# Patient Record
Sex: Male | Born: 1965 | Race: White | Hispanic: No | Marital: Married | Smoking: Never smoker
Health system: Southern US, Community
[De-identification: ages and names within clinical notes are randomized; demographics above are authoritative.]

## PROBLEM LIST (undated history)

## (undated) DIAGNOSIS — I10 Essential (primary) hypertension: Secondary | ICD-10-CM

## (undated) DIAGNOSIS — I2510 Atherosclerotic heart disease of native coronary artery without angina pectoris: Secondary | ICD-10-CM

## (undated) DIAGNOSIS — F329 Major depressive disorder, single episode, unspecified: Secondary | ICD-10-CM

## (undated) DIAGNOSIS — I5020 Unspecified systolic (congestive) heart failure: Secondary | ICD-10-CM

## (undated) DIAGNOSIS — I350 Nonrheumatic aortic (valve) stenosis: Secondary | ICD-10-CM

## (undated) DIAGNOSIS — M545 Low back pain, unspecified: Secondary | ICD-10-CM

## (undated) DIAGNOSIS — Z9989 Dependence on other enabling machines and devices: Secondary | ICD-10-CM

## (undated) DIAGNOSIS — F32A Depression, unspecified: Secondary | ICD-10-CM

## (undated) DIAGNOSIS — J45909 Unspecified asthma, uncomplicated: Secondary | ICD-10-CM

## (undated) DIAGNOSIS — Z953 Presence of xenogenic heart valve: Secondary | ICD-10-CM

## (undated) DIAGNOSIS — R519 Headache, unspecified: Secondary | ICD-10-CM

## (undated) DIAGNOSIS — R011 Cardiac murmur, unspecified: Secondary | ICD-10-CM

## (undated) DIAGNOSIS — G4733 Obstructive sleep apnea (adult) (pediatric): Secondary | ICD-10-CM

## (undated) DIAGNOSIS — I509 Heart failure, unspecified: Secondary | ICD-10-CM

## (undated) DIAGNOSIS — M51369 Other intervertebral disc degeneration, lumbar region without mention of lumbar back pain or lower extremity pain: Secondary | ICD-10-CM

## (undated) DIAGNOSIS — R197 Diarrhea, unspecified: Secondary | ICD-10-CM

## (undated) DIAGNOSIS — I428 Other cardiomyopathies: Secondary | ICD-10-CM

## (undated) DIAGNOSIS — M5136 Other intervertebral disc degeneration, lumbar region: Secondary | ICD-10-CM

## (undated) DIAGNOSIS — E785 Hyperlipidemia, unspecified: Secondary | ICD-10-CM

## (undated) DIAGNOSIS — M7989 Other specified soft tissue disorders: Secondary | ICD-10-CM

## (undated) DIAGNOSIS — E119 Type 2 diabetes mellitus without complications: Secondary | ICD-10-CM

## (undated) DIAGNOSIS — I272 Pulmonary hypertension, unspecified: Secondary | ICD-10-CM

## (undated) HISTORY — DX: Heart failure, unspecified: I50.9

## (undated) HISTORY — PX: CARDIAC CATHETERIZATION: SHX172

## (undated) HISTORY — DX: Unspecified systolic (congestive) heart failure: I50.20

## (undated) HISTORY — DX: Dependence on other enabling machines and devices: Z99.89

## (undated) HISTORY — DX: Low back pain: M54.5

## (undated) HISTORY — DX: Other cardiomyopathies: I42.8

## (undated) HISTORY — DX: Nonrheumatic aortic (valve) stenosis: I35.0

## (undated) HISTORY — DX: Essential (primary) hypertension: I10

## (undated) HISTORY — DX: Morbid (severe) obesity due to excess calories: E66.01

## (undated) HISTORY — DX: Other intervertebral disc degeneration, lumbar region: M51.36

## (undated) HISTORY — DX: Atherosclerotic heart disease of native coronary artery without angina pectoris: I25.10

## (undated) HISTORY — DX: Type 2 diabetes mellitus without complications: E11.9

## (undated) HISTORY — DX: Unspecified asthma, uncomplicated: J45.909

## (undated) HISTORY — DX: Diarrhea, unspecified: R19.7

## (undated) HISTORY — DX: Presence of xenogenic heart valve: Z95.3

## (undated) HISTORY — DX: Major depressive disorder, single episode, unspecified: F32.9

## (undated) HISTORY — DX: Other specified soft tissue disorders: M79.89

## (undated) HISTORY — DX: Obstructive sleep apnea (adult) (pediatric): G47.33

## (undated) HISTORY — DX: Depression, unspecified: F32.A

## (undated) HISTORY — DX: Low back pain, unspecified: M54.50

## (undated) HISTORY — DX: Other intervertebral disc degeneration, lumbar region without mention of lumbar back pain or lower extremity pain: M51.369

## (undated) HISTORY — DX: Hyperlipidemia, unspecified: E78.5

---

## 2004-03-19 ENCOUNTER — Encounter: Admission: RE | Admit: 2004-03-19 | Discharge: 2004-03-19 | Payer: Self-pay | Admitting: Neurosurgery

## 2007-08-03 ENCOUNTER — Ambulatory Visit: Payer: Self-pay | Admitting: Family Medicine

## 2008-03-28 ENCOUNTER — Ambulatory Visit: Payer: Self-pay | Admitting: Family Medicine

## 2008-03-29 ENCOUNTER — Ambulatory Visit: Payer: Self-pay | Admitting: Family Medicine

## 2008-03-31 ENCOUNTER — Emergency Department: Payer: Self-pay | Admitting: Unknown Physician Specialty

## 2008-06-29 ENCOUNTER — Ambulatory Visit (HOSPITAL_COMMUNITY): Admission: RE | Admit: 2008-06-29 | Discharge: 2008-06-29 | Payer: Self-pay | Admitting: Cardiovascular Disease

## 2008-08-13 ENCOUNTER — Ambulatory Visit: Payer: Self-pay | Admitting: Family Medicine

## 2008-10-05 ENCOUNTER — Ambulatory Visit: Payer: Self-pay | Admitting: Family Medicine

## 2008-11-13 ENCOUNTER — Encounter: Payer: Self-pay | Admitting: Family Medicine

## 2008-11-15 ENCOUNTER — Encounter: Payer: Self-pay | Admitting: Family Medicine

## 2008-12-16 ENCOUNTER — Encounter: Payer: Self-pay | Admitting: Family Medicine

## 2009-01-16 ENCOUNTER — Encounter: Payer: Self-pay | Admitting: Family Medicine

## 2009-09-26 ENCOUNTER — Ambulatory Visit: Payer: Self-pay | Admitting: Family Medicine

## 2010-05-27 ENCOUNTER — Ambulatory Visit: Payer: Self-pay | Admitting: Family Medicine

## 2010-06-09 ENCOUNTER — Encounter
Admission: RE | Admit: 2010-06-09 | Discharge: 2010-06-09 | Payer: Self-pay | Source: Home / Self Care | Attending: Orthopedic Surgery | Admitting: Orthopedic Surgery

## 2010-09-02 LAB — HEPATIC FUNCTION PANEL
ALT: 29 U/L (ref 0–53)
AST: 25 U/L (ref 0–37)
Albumin: 3.2 g/dL — ABNORMAL LOW (ref 3.5–5.2)
Alkaline Phosphatase: 84 U/L (ref 39–117)
Bilirubin, Direct: 0.2 mg/dL (ref 0.0–0.3)
Indirect Bilirubin: 0.3 mg/dL (ref 0.3–0.9)
Total Bilirubin: 0.5 mg/dL (ref 0.3–1.2)
Total Protein: 6.8 g/dL (ref 6.0–8.3)

## 2010-09-02 LAB — LIPID PANEL
Cholesterol: 149 mg/dL (ref 0–200)
HDL: 28 mg/dL — ABNORMAL LOW (ref >39)
LDL Cholesterol: 108 mg/dL — ABNORMAL HIGH (ref 0–99)
Total CHOL/HDL Ratio: 5.3 RATIO
Triglycerides: 63 mg/dL (ref <150)
VLDL: 13 mg/dL (ref 0–40)

## 2010-09-02 LAB — POCT I-STAT 3, VENOUS BLOOD GAS (G3P V)
Acid-base deficit: 5 mmol/L — ABNORMAL HIGH (ref 0.0–2.0)
Bicarbonate: 21.3 mEq/L (ref 20.0–24.0)
O2 Saturation: 64 %
TCO2: 23 mmol/L (ref 0–100)
pCO2, Ven: 41.9 mmHg — ABNORMAL LOW (ref 45.0–50.0)
pH, Ven: 7.315 — ABNORMAL HIGH (ref 7.250–7.300)
pO2, Ven: 36 mmHg (ref 30.0–45.0)

## 2010-09-02 LAB — POCT I-STAT 3, ART BLOOD GAS (G3+)
Acid-base deficit: 1 mmol/L (ref 0.0–2.0)
Bicarbonate: 23.2 mEq/L (ref 20.0–24.0)
O2 Saturation: 96 %
TCO2: 24 mmol/L (ref 0–100)
pCO2 arterial: 35.5 mmHg (ref 35.0–45.0)
pH, Arterial: 7.424 (ref 7.350–7.450)
pO2, Arterial: 80 mmHg (ref 80.0–100.0)

## 2010-09-30 NOTE — Cardiovascular Report (Signed)
NAMEBJ, MORLOCK NO.:  [id number]   MEDICAL RECORD NO.:  [id number]          PATIENT TYPE:  OIB   LOCATION:  2899                         FACILITY:  [facility]   PHYSICIAN:  Minna Merritts, MD   DATE OF BIRTH:  March 12, 1966   DATE OF PROCEDURE:  06/29/2008  DATE OF DISCHARGE:  06/29/2008                            CARDIAC CATHETERIZATION   PHYSICIAN PERFORMING THE PROCEDURE:  Minna Merritts, MD   PRIMARY CARE PHYSICIAN:  [name], MD, [facility].   REASON FOR PROCEDURE:  Mr. Macon is a very pleasant 45 year old  gentleman with severe obesity, worsening chest pain with exertion,  shortness of breath, and worsening lower extremity edema.  He has a  strong family history of coronary artery disease.  We were unable to  perform a routine stress test due to his body weight of greater than 430  pounds, and is brought to the cardiac catheterization lab for further  evaluation.   PROCEDURE DETAILS:  The risks and benefits of the procedure were  detailed to the patient, and consent was obtained.  He was brought to  the cardiac cath lab and prepped and draped in usual sterile fashion.  The modified Seldinger technique was used to cannulate the right femoral  vein and right femoral artery.  Long sheaths were used.  A 7-French Swan  catheter was used to obtain right heart pressures including wedge  pressure, and saturations for cardiac output and cardiac index.  For  coronary arterial evaluation, a 6-French Judkins left #4 and right #4  catheter were used to engage the left main and the right coronary artery  respectively.  Hand injections of contrast were used and cinematography  recorded the coronary anatomy.  A pigtail catheter was used to cross the  aortic valve and LV gram was performed.  Pullback across the valve was  used to estimate aortic valve gradient.  At the end of the case, the  sheaths were removed and manual pressure applied.  No  complications were  reported.   CORONARY ANATOMY:  Left main; the left main is a moderate-to-large size  vessel that bifurcates into the LAD and left circumflex.  There is no  significant disease noted.   Left anterior descending; the LAD is a moderate-to-large size vessel  extends distally.  There is 1 large diagonal branch that bifurcates.  There is no significant disease noted.   Left circumflex; the left circumflex is a large vessel that has 2 obtuse  marginal branches.  The OM1 takes off proximally, the OM2 takes off in  the mid region and is bifurcating.  There is a long moderate-sized AV  groove circumflex.  This is likely a codominant or dominant vessel.  There is no significant disease noted.   Right coronary artery; bifurcates at the ostium and is either a  nondominant or codominant vessel.  No significant disease noted.   Right heart pressures; right atrial pressure of 14 (mean), RV 52/2, mean  of 18, PA pressure 48/31, mean of 39, wedge pressure of 27, aortic  pressure of 126/95, LV pressure 151/18, (  end diastolic pressure 18).   LV gram shows normal systolic function with ejection fraction of greater  than 55% and no focal wall motion abnormalities.   FINAL SUMMARY:  Left dominant or codominant coronary system with no  significant coronary arterial disease.  Normal left ventricular systolic  function with ejection fraction greater than 55% and cardiac index of  2.31.  Right heart pressures are moderately elevated consistent with  mild-to-moderate pulmonary hypertension.  Wedge pressure is 27 and end-  diastolic pressure is elevated.   Given the patient's shortness breath, edema, and chest discomfort, we  will continue him on the Lasix, recommended that he have a sleep study  for a suspected obstructive sleep apnea and consider a statin if his  cholesterol is elevated given his strong family history.      Minna Merritts, MD  Electronically Signed      TJG/MEDQ  D:  06/29/2008  T:  06/29/2008  Job:  [id number]   cc:   [name], M.D.

## 2011-09-11 ENCOUNTER — Ambulatory Visit: Payer: Self-pay | Admitting: Family Medicine

## 2011-09-14 ENCOUNTER — Other Ambulatory Visit: Payer: Self-pay | Admitting: Orthopedic Surgery

## 2011-09-14 DIAGNOSIS — M545 Low back pain: Secondary | ICD-10-CM

## 2011-09-17 ENCOUNTER — Other Ambulatory Visit (HOSPITAL_COMMUNITY): Payer: Self-pay | Admitting: Orthopedic Surgery

## 2011-09-17 DIAGNOSIS — M4800 Spinal stenosis, site unspecified: Secondary | ICD-10-CM

## 2011-09-17 DIAGNOSIS — M545 Low back pain: Secondary | ICD-10-CM

## 2011-10-09 ENCOUNTER — Ambulatory Visit (HOSPITAL_COMMUNITY)
Admission: RE | Admit: 2011-10-09 | Discharge: 2011-10-09 | Disposition: A | Discharge status: Home / Self Care | Payer: BC Managed Care – PPO | Source: Ambulatory Visit | Attending: Orthopedic Surgery | Admitting: Orthopedic Surgery

## 2011-10-09 DIAGNOSIS — M4800 Spinal stenosis, site unspecified: Secondary | ICD-10-CM | POA: Insufficient documentation

## 2011-10-09 DIAGNOSIS — M47817 Spondylosis without myelopathy or radiculopathy, lumbosacral region: Secondary | ICD-10-CM | POA: Insufficient documentation

## 2011-10-09 DIAGNOSIS — M545 Low back pain: Secondary | ICD-10-CM

## 2011-10-09 MED ORDER — IOHEXOL 180 MG/ML  SOLN
10.0000 mL | Freq: Once | INTRAMUSCULAR | Status: AC | PRN
  Administered 2011-10-09: 2 mL via EPIDURAL

## 2011-10-09 MED ORDER — METHYLPREDNISOLONE ACETATE 80 MG/ML IJ SUSP
INTRAMUSCULAR | Status: AC
  Filled 2011-10-09: qty 1

## 2011-10-09 MED ORDER — METHYLPREDNISOLONE ACETATE 40 MG/ML IJ SUSP
INTRAMUSCULAR | Status: AC
  Filled 2011-10-09: qty 5

## 2011-10-09 NOTE — Procedures (Signed)
Epidural steroid

## 2011-10-19 ENCOUNTER — Telehealth (HOSPITAL_COMMUNITY): Payer: Self-pay

## 2011-10-19 NOTE — Telephone Encounter (Signed)
Spoke with Brett Marshall in regards to getting pt scheduled for his next 2 injections

## 2011-10-20 ENCOUNTER — Other Ambulatory Visit (HOSPITAL_COMMUNITY): Payer: Self-pay | Admitting: Orthopedic Surgery

## 2011-10-20 DIAGNOSIS — M4800 Spinal stenosis, site unspecified: Secondary | ICD-10-CM

## 2011-10-20 DIAGNOSIS — M545 Low back pain: Secondary | ICD-10-CM

## 2011-10-27 ENCOUNTER — Encounter (HOSPITAL_COMMUNITY): Payer: Self-pay | Admitting: Pharmacy Technician

## 2011-11-02 ENCOUNTER — Ambulatory Visit (HOSPITAL_COMMUNITY)
Admission: RE | Admit: 2011-11-02 | Discharge: 2011-11-02 | Disposition: A | Discharge status: Home / Self Care | Payer: BC Managed Care – PPO | Source: Ambulatory Visit | Attending: Orthopedic Surgery | Admitting: Orthopedic Surgery

## 2011-11-02 DIAGNOSIS — M545 Low back pain, unspecified: Secondary | ICD-10-CM

## 2011-11-02 DIAGNOSIS — M4800 Spinal stenosis, site unspecified: Secondary | ICD-10-CM

## 2011-11-02 DIAGNOSIS — M47817 Spondylosis without myelopathy or radiculopathy, lumbosacral region: Secondary | ICD-10-CM | POA: Insufficient documentation

## 2011-11-02 MED ORDER — METHYLPREDNISOLONE ACETATE 80 MG/ML IJ SUSP
INTRAMUSCULAR | Status: AC
  Filled 2011-11-02: qty 1

## 2011-11-02 MED ORDER — METHYLPREDNISOLONE ACETATE 40 MG/ML IJ SUSP
INTRAMUSCULAR | Status: AC
  Filled 2011-11-02: qty 5

## 2011-11-23 ENCOUNTER — Telehealth (HOSPITAL_COMMUNITY): Payer: Self-pay

## 2011-11-23 NOTE — Telephone Encounter (Signed)
Brett Marshall left a message stating that the 9th will work for his schedule

## 2011-11-24 ENCOUNTER — Ambulatory Visit (HOSPITAL_COMMUNITY)
Admission: RE | Admit: 2011-11-24 | Discharge: 2011-11-24 | Disposition: A | Discharge status: Home / Self Care | Payer: BC Managed Care – PPO | Source: Ambulatory Visit | Attending: Orthopedic Surgery | Admitting: Orthopedic Surgery

## 2011-11-24 DIAGNOSIS — M4800 Spinal stenosis, site unspecified: Secondary | ICD-10-CM

## 2011-11-24 DIAGNOSIS — M545 Low back pain: Secondary | ICD-10-CM

## 2011-11-24 DIAGNOSIS — M47817 Spondylosis without myelopathy or radiculopathy, lumbosacral region: Secondary | ICD-10-CM | POA: Insufficient documentation

## 2011-11-24 MED ORDER — METHYLPREDNISOLONE ACETATE 80 MG/ML IJ SUSP
INTRAMUSCULAR | Status: AC
  Administered 2011-11-24: 80 mg via EPIDURAL
  Filled 2011-11-24: qty 1

## 2011-11-24 MED ORDER — IOHEXOL 180 MG/ML  SOLN
10.0000 mL | Freq: Once | INTRAMUSCULAR | Status: AC | PRN
  Administered 2011-11-24: 2 mL via EPIDURAL

## 2011-11-24 MED ORDER — METHYLPREDNISOLONE ACETATE 40 MG/ML IJ SUSP
INTRAMUSCULAR | Status: AC
  Administered 2011-11-24: 40 mg via EPIDURAL
  Filled 2011-11-24: qty 5

## 2011-11-25 ENCOUNTER — Ambulatory Visit (HOSPITAL_COMMUNITY): Payer: BC Managed Care – PPO

## 2012-03-07 ENCOUNTER — Ambulatory Visit: Payer: Self-pay | Admitting: Family Medicine

## 2012-03-14 ENCOUNTER — Encounter: Payer: Self-pay | Admitting: *Deleted

## 2012-03-14 ENCOUNTER — Encounter: Payer: Self-pay | Admitting: Family Medicine

## 2012-12-26 ENCOUNTER — Ambulatory Visit (INDEPENDENT_AMBULATORY_CARE_PROVIDER_SITE_OTHER): Payer: BC Managed Care – PPO | Admitting: Family Medicine

## 2012-12-26 ENCOUNTER — Encounter: Payer: Self-pay | Admitting: Family Medicine

## 2012-12-26 VITALS — BP 171/99 | HR 83 | Temp 98.5°F | Resp 18 | Ht 68.5 in | Wt >= 6400 oz

## 2012-12-26 DIAGNOSIS — I8310 Varicose veins of unspecified lower extremity with inflammation: Secondary | ICD-10-CM

## 2012-12-26 DIAGNOSIS — M6240 Contracture of muscle, unspecified site: Secondary | ICD-10-CM

## 2012-12-26 DIAGNOSIS — I10 Essential (primary) hypertension: Secondary | ICD-10-CM

## 2012-12-26 DIAGNOSIS — M7989 Other specified soft tissue disorders: Secondary | ICD-10-CM

## 2012-12-26 DIAGNOSIS — M545 Low back pain: Secondary | ICD-10-CM

## 2012-12-26 DIAGNOSIS — I872 Venous insufficiency (chronic) (peripheral): Secondary | ICD-10-CM

## 2012-12-26 LAB — CBC WITH DIFFERENTIAL/PLATELET
Basophils Absolute: 0 10*3/uL (ref 0.0–0.1)
Basophils Relative: 0 % (ref 0–1)
Eosinophils Absolute: 0.3 10*3/uL (ref 0.0–0.7)
Eosinophils Relative: 2 % (ref 0–5)
Hemoglobin: 14 g/dL (ref 13.0–17.0)
Lymphocytes Relative: 25 % (ref 12–46)
Lymphs Abs: 2.6 10*3/uL (ref 0.7–4.0)
MCH: 28.3 pg (ref 26.0–34.0)
MCHC: 34.1 g/dL (ref 30.0–36.0)
MCV: 83.2 fL (ref 78.0–100.0)
Monocytes Relative: 8 % (ref 3–12)
Neutrophils Relative %: 65 % (ref 43–77)
Platelets: 260 10*3/uL (ref 150–400)
RBC: 4.94 MIL/uL (ref 4.22–5.81)
RDW: 14.2 % (ref 11.5–15.5)
WBC: 10.6 10*3/uL — ABNORMAL HIGH (ref 4.0–10.5)

## 2012-12-26 LAB — LIPID PANEL
Cholesterol: 161 mg/dL (ref 0–200)
HDL: 47 mg/dL (ref >39)
LDL Cholesterol: 100 mg/dL — ABNORMAL HIGH (ref 0–99)
Total CHOL/HDL Ratio: 3.4 Ratio
Triglycerides: 72 mg/dL (ref <150)
VLDL: 14 mg/dL (ref 0–40)

## 2012-12-26 LAB — COMPREHENSIVE METABOLIC PANEL
AST: 25 U/L (ref 0–37)
Albumin: 3.8 g/dL (ref 3.5–5.2)
Alkaline Phosphatase: 106 U/L (ref 39–117)
BUN: 12 mg/dL (ref 6–23)
CO2: 27 mEq/L (ref 19–32)
Chloride: 105 mEq/L (ref 96–112)
Creat: 0.85 mg/dL (ref 0.50–1.35)
Glucose, Bld: 115 mg/dL — ABNORMAL HIGH (ref 70–99)
Potassium: 4.2 mEq/L (ref 3.5–5.3)
Sodium: 137 mEq/L (ref 135–145)
Total Bilirubin: 0.4 mg/dL (ref 0.3–1.2)
Total Protein: 7.6 g/dL (ref 6.0–8.3)

## 2012-12-26 LAB — TSH: TSH: 4.064 u[IU]/mL (ref 0.350–4.500)

## 2012-12-26 MED ORDER — HYDROCHLOROTHIAZIDE 12.5 MG PO TABS: 12.5000 mg | ORAL_TABLET | Freq: Every day | ORAL | Status: DC

## 2012-12-26 MED ORDER — TRIAMCINOLONE ACETONIDE 0.1 % EX CREA: TOPICAL_CREAM | Freq: Two times a day (BID) | CUTANEOUS | Status: DC

## 2012-12-26 MED ORDER — CYCLOBENZAPRINE HCL 10 MG PO TABS: 10.0000 mg | ORAL_TABLET | Freq: Three times a day (TID) | ORAL | Status: DC | PRN

## 2012-12-26 MED ORDER — HYDROCODONE-ACETAMINOPHEN 5-325 MG PO TABS: 1.0000 | ORAL_TABLET | Freq: Four times a day (QID) | ORAL | Status: DC | PRN

## 2012-12-26 MED ORDER — MELOXICAM 15 MG PO TABS: 15.0000 mg | ORAL_TABLET | Freq: Every day | ORAL | Status: DC

## 2012-12-26 MED ORDER — LOSARTAN POTASSIUM 50 MG PO TABS: 50.0000 mg | ORAL_TABLET | Freq: Every day | ORAL | Status: DC

## 2012-12-26 NOTE — Progress Notes (Signed)
[address]   [place], [place]  [address]   [phone]  Subjective:    Patient ID: Brett Marshall, male    DOB: February 14, 1966, 47 y.o.   MRN: [id number]  HPI This 47 y.o. male presents to establish care.    1.  Low back pain:  Has worsened since last visit.  Last ortho evaluation 2013; s/p shots last year.  S/p rheumatologist.  Back pain much improved with injections.  R hip pain never went away after injections.  [place] injections; [place].  Was taking Meloxicam, Flexeril for a while; now out.  Unable to sleep at night due to pain. Usually sleeps on couch sitting up.    2.  Leg pain: R hip and R lateral knee hurts; below knees hurts a lot.    3.  Leg swelling: s/p cardiology consultation in past; no cardiac source of swelling.  S/p allergist.  S/p heart catheterization negative.  After three injections of spine, leg swelling went down for extended duration.  Foot swelling is fairly new.  Since taking shoes off, feet has swelled.  Does not wear shoes at home; does not elevate feet at home.  Will elevate L leg which is not comfortable; R leg always swells more.  Swelling started after immobilization.  Swelling has worsened in past.  Itches like crazy.  Swelling is painful; pain is intermittent.  Redness today is darker than baseline on L side.  Feet swelling more due to sleeping sitting up.    4.  HTN:  Not checking BP at home.  Not currently taking BP medication.  BP was under control off of medication in past.  Had evaluated by Dortha Kern last year; BP good.  BP was stable at hospital with injections.     Review of Systems  Constitutional: Negative for chills, diaphoresis and fatigue.  Respiratory: Positive for cough. Negative for shortness of breath, wheezing and stridor.   Cardiovascular: Positive for leg swelling. Negative for chest pain and palpitations.  Gastrointestinal: Negative for nausea, vomiting and abdominal pain.  Musculoskeletal: Positive for myalgias, back pain,  arthralgias and gait problem.  Skin: Positive for color change and rash. Negative for pallor and wound.  Neurological: Negative for dizziness, tremors, seizures, syncope, facial asymmetry, speech difficulty, weakness, light-headedness, numbness and headaches.    Past Medical History  Diagnosis Date  . Essential hypertension, benign   . Glucose intolerance (impaired glucose tolerance)   . DDD (degenerative disc disease), lumbar   . Low back pain   . Leg swelling     Left  . OSA on CPAP     non compliant  . Depression   . Asthma     History reviewed. No pertinent past surgical history.  Prior to Admission medications   Medication Sig Start Date End Date Taking? Authorizing Provider  albuterol (PROVENTIL HFA;VENTOLIN HFA) 108 (90 BASE) MCG/ACT inhaler Inhale 2 puffs into the lungs every 6 (six) hours as needed. For shortness of breath   Yes Historical Provider, MD  ibuprofen (ADVIL,MOTRIN) 200 MG tablet Take 200 mg by mouth daily as needed. For headache   Yes Historical Provider, MD  cyclobenzaprine (FLEXERIL) 10 MG tablet Take 1 tablet (10 mg total) by mouth 3 (three) times daily as needed. For spasms 12/26/12   Ethelda Chick, MD  hydrochlorothiazide (HYDRODIURIL) 12.5 MG tablet Take 1 tablet (12.5 mg total) by mouth daily. 12/26/12   Ethelda Chick, MD  HYDROcodone-acetaminophen (NORCO/VICODIN) 5-325 MG per tablet Take 1 tablet  by mouth every 6 (six) hours as needed for pain. 12/26/12   Ethelda Chick, MD  loratadine (CLARITIN) 10 MG tablet Take 10 mg by mouth daily as needed.    Historical Provider, MD  losartan (COZAAR) 50 MG tablet Take 1 tablet (50 mg total) by mouth daily. 12/26/12   Ethelda Chick, MD  losartan-hydrochlorothiazide (HYZAAR) 50-12.5 MG per tablet Take 1 tablet by mouth daily.    Historical Provider, MD  meloxicam (MOBIC) 15 MG tablet Take 1 tablet (15 mg total) by mouth daily. 12/26/12   Ethelda Chick, MD  triamcinolone cream (KENALOG) 0.1 % Apply topically 2 (two)  times daily. 12/26/12   Ethelda Chick, MD    No Known Allergies  History   Social History  . Marital Status: Married    Spouse Name: N/A    Number of Children: N/A  . Years of Education: N/A   Occupational History  . Quarry manager    Social History Main Topics  . Smoking status: Never Smoker   . Smokeless tobacco: Not on file  . Alcohol Use: No  . Drug Use: No  . Sexually Active: Not on file   Other Topics Concern  . Not on file   Social History Narrative   Goes to gym.    Family History  Problem Relation Age of Onset  . Diabetes Mother   . Asthma Mother   . Heart disease Father   . Heart disease Brother        Objective:   Physical Exam  Nursing note and vitals reviewed. Constitutional: He is oriented to person, place, and time. He appears well-developed and well-nourished. No distress.  HENT:  Head: Normocephalic and atraumatic.  Mouth/Throat: Oropharynx is clear and moist.  Eyes: Conjunctivae and EOM are normal. Pupils are equal, round, and reactive to light.  Neck: Normal range of motion. Neck supple. No JVD present. No thyromegaly present.  Cardiovascular: Normal rate, regular rhythm and normal heart sounds.  Exam reveals no gallop and no friction rub.   No murmur heard. 2+ non-pitting edema to upper calves BLE.  Pulmonary/Chest: Effort normal and breath sounds normal. He has no wheezes. He has no rales.  Musculoskeletal:       Lumbar back: He exhibits tenderness and pain. He exhibits normal range of motion, no swelling, no edema, no deformity, no laceration, no spasm and normal pulse.  LUMBAR SPINE: STRAIGHT LEG RAISES NEGATIVE; MOTOR 5/5; TOE AND HEEL WALKING INTACT; MARCHING INTACT.  +TTP R PARASPINAL REGION.  Lymphadenopathy:    He has no cervical adenopathy.  Neurological: He is alert and oriented to person, place, and time. No cranial nerve deficit. He exhibits normal muscle tone. Coordination normal.  Skin: Skin is warm and dry. Rash noted.  He is not diaphoretic. There is erythema.  B anterior shins/lower legs with diffuse erythema; L lateral leg with localized area of worsening erythema 3 cm x 4 cm; +TTP; no fluctuants, warmth, or pustules.   Psychiatric: He has a normal mood and affect. His behavior is normal.       Assessment & Plan:  Essential hypertension, benign - Plan: losartan (COZAAR) 50 MG tablet, Lipid panel, Comprehensive metabolic panel, TSH, Hemoglobin A1c  Leg swelling - Plan: hydrochlorothiazide (HYDRODIURIL) 12.5 MG tablet, CBC with Differential, Hemoglobin A1c  Low back pain - Plan: meloxicam (MOBIC) 15 MG tablet, cyclobenzaprine (FLEXERIL) 10 MG tablet, HYDROcodone-acetaminophen (NORCO/VICODIN) 5-325 MG per tablet, Ambulatory referral to Orthopedic Surgery, Comprehensive metabolic panel  Venous  stasis dermatitis, unspecified laterality - Plan: CBC with Differential   1. HTN: uncontrolled; obtain labs; rx for Losartan 50mg  daily, HCTZ 12.5mg  once daily.   2.  Venous stasis:  Persistent; rx for HCTZ 12.5mg  daily; recommend compression stockings at work daily; recommend weight loss and elevation of legs during the day.  S/p extensive cardiac work-up in past few years; negative work up. 3.  Low back pain:  Recurrent; refer back to Dr. Jena Gauss for further evaluation; refill of Mobic, Flexeril, Hydrocodone provided. 4.  Venous stasis dermatitis:  New. Rx for Triamcinolone provided to use bid for two weeks and then decrease to q d as tolerated.   Meds ordered this encounter  Medications  . meloxicam (MOBIC) 15 MG tablet    Sig: Take 1 tablet (15 mg total) by mouth daily.    Dispense:  30 tablet    Refill:  5  . cyclobenzaprine (FLEXERIL) 10 MG tablet    Sig: Take 1 tablet (10 mg total) by mouth 3 (three) times daily as needed. For spasms    Dispense:  90 tablet    Refill:  5  . HYDROcodone-acetaminophen (NORCO/VICODIN) 5-325 MG per tablet    Sig: Take 1 tablet by mouth every 6 (six) hours as needed for pain.     Dispense:  60 tablet    Refill:  3  . losartan (COZAAR) 50 MG tablet    Sig: Take 1 tablet (50 mg total) by mouth daily.    Dispense:  30 tablet    Refill:  5  . hydrochlorothiazide (HYDRODIURIL) 12.5 MG tablet    Sig: Take 1 tablet (12.5 mg total) by mouth daily.    Dispense:  30 tablet    Refill:  5  . triamcinolone cream (KENALOG) 0.1 %    Sig: Apply topically 2 (two) times daily.    Dispense:  45 g    Refill:  5

## 2013-05-08 ENCOUNTER — Ambulatory Visit: Payer: Self-pay | Admitting: Physician Assistant

## 2014-04-18 ENCOUNTER — Emergency Department: Payer: Self-pay | Admitting: Emergency Medicine

## 2014-04-19 ENCOUNTER — Ambulatory Visit (INDEPENDENT_AMBULATORY_CARE_PROVIDER_SITE_OTHER): Payer: BC Managed Care – PPO | Admitting: Family Medicine

## 2014-04-19 VITALS — BP 142/92 | HR 84 | Temp 98.2°F | Resp 16 | Ht 68.5 in | Wt 391.6 lb

## 2014-04-19 DIAGNOSIS — I10 Essential (primary) hypertension: Secondary | ICD-10-CM

## 2014-04-19 DIAGNOSIS — R7302 Impaired glucose tolerance (oral): Secondary | ICD-10-CM

## 2014-04-19 DIAGNOSIS — M542 Cervicalgia: Secondary | ICD-10-CM

## 2014-04-19 DIAGNOSIS — M25511 Pain in right shoulder: Secondary | ICD-10-CM

## 2014-04-19 MED ORDER — HYDROCODONE-ACETAMINOPHEN 5-325 MG PO TABS: 1.0000 | ORAL_TABLET | Freq: Four times a day (QID) | ORAL | Status: DC | PRN

## 2014-04-19 NOTE — Patient Instructions (Signed)
1.  Stop Meloxicam while taking Prednisone. 2.  Check blood pressure daily for the next two weeks.

## 2014-04-19 NOTE — Progress Notes (Signed)
Subjective:    Patient ID: Brett Marshall, male    DOB: 10-09-1965, 48 y.o.   MRN: [id number]  04/19/2014  Shoulder Pain and Hypertension   HPI This 48 y.o. male presents for evaluation of R neck pain.  Awoke five days ago with acute onset of R posterior neck pain.  Evaluated by chiropractor two days later with temporary improvement.  Then woke up yesterday with acute pain with elevation of R shoulder; uanble to elevate R arm above 90 degrees. Having a lot of pain into R posterior neck.  Some pan in posterior shoulder and deltoid region.  +numbness and tingling.  Feels very weak.  No unusual activity prior to onset; thought might have slept wrong.  Similar symptoms several years ago; symptoms resolved last time after one week.  ED evaluation at [facility] yesterday of neck and shoulder films; prescribed Valium during visit; diagnosed DISH disease. Works for [employer].  Prescribed prednisone 73m and taper down until gone. Also prescribed hydrocodone 1 every six hours PRN; has been taking scheduled.  Hydrocodone #12 helps reduce pain. Still taking Cyclobenzoprine and Meloxicam.    Blood pressure was high in ED so presenting for follow-up; BP  193/100 in ED but in a lot of pain. Denies HA/dizziness/blurred vision/slurred speech.  Denies CP/palp/SOB.  Leg swelling is improved.    Glucose intolerance: Has lost 50 pounds in past year with [employer].    Review of Systems  Constitutional: Negative for fever, chills, diaphoresis, activity change, appetite change and fatigue.  Eyes: Negative for visual disturbance.  Respiratory: Negative for cough and shortness of breath.   Cardiovascular: Negative for chest pain, palpitations and leg swelling.  Endocrine: Negative for cold intolerance, heat intolerance, polydipsia, polyphagia and polyuria.  Musculoskeletal: Positive for myalgias, joint swelling, arthralgias, neck pain and neck stiffness.  Skin: Negative for rash and wound.  Neurological: Negative  for dizziness, tremors, seizures, syncope, facial asymmetry, speech difficulty, weakness, light-headedness, numbness and headaches.    Past Medical History  Diagnosis Date  . Essential hypertension, benign   . Glucose intolerance (impaired glucose tolerance)   . DDD (degenerative disc disease), lumbar   . Low back pain   . Leg swelling     Left  . OSA on CPAP     non compliant  . Depression   . Asthma    History reviewed. No pertinent past surgical history. No Known Allergies Current Outpatient Prescriptions  Medication Sig Dispense Refill  . albuterol (PROVENTIL HFA;VENTOLIN HFA) 108 (90 BASE) MCG/ACT inhaler Inhale 2 puffs into the lungs every 6 (six) hours as needed. For shortness of breath    . cyclobenzaprine (FLEXERIL) 10 MG tablet Take 1 tablet (10 mg total) by mouth 3 (three) times daily as needed. For spasms 90 tablet 5  . ibuprofen (ADVIL,MOTRIN) 200 MG tablet Take 200 mg by mouth daily as needed. For headache    . meloxicam (MOBIC) 15 MG tablet Take 1 tablet (15 mg total) by mouth daily. 30 tablet 5  . predniSONE (DELTASONE) 10 MG tablet Take 10 mg by mouth daily with breakfast.    . HYDROcodone-acetaminophen (NORCO/VICODIN) 5-325 MG per tablet Take 1-2 tablets by mouth every 6 (six) hours as needed for moderate pain. 40 tablet 0   No current facility-administered medications for this visit.       Objective:    BP 142/92 mmHg  Pulse 84  Temp(Src) 98.2 F (36.8 C) (Oral)  Resp 16  Ht 5' 8.5" (1.74 m)  Wt  391 lb 9.6 oz (177.629 kg)  BMI 58.67 kg/m2  SpO2 98% Physical Exam  Constitutional: He is oriented to person, place, and time. He appears well-developed and well-nourished. No distress.  HENT:  Head: Normocephalic and atraumatic.  Right Ear: External ear normal.  Left Ear: External ear normal.  Nose: Nose normal.  Mouth/Throat: Oropharynx is clear and moist.  Eyes: Conjunctivae and EOM are normal. Pupils are equal, round, and reactive to light.  Neck:  Neck supple. Muscular tenderness present. No spinous process tenderness present. Carotid bruit is not present. No rigidity. Decreased range of motion present. No edema and no erythema present. No thyromegaly present.  Cardiovascular: Normal rate, regular rhythm, normal heart sounds and intact distal pulses.  Exam reveals no gallop and no friction rub.   No murmur heard. 2+ pitting edema to proximal calves B.  Pulmonary/Chest: Effort normal and breath sounds normal. He has no wheezes. He has no rales.  Abdominal: Soft. Bowel sounds are normal. He exhibits no distension and no mass. There is no tenderness. There is no rebound and no guarding.  Musculoskeletal:       Right shoulder: He exhibits decreased range of motion, tenderness, pain, spasm and decreased strength. He exhibits no bony tenderness and normal pulse.       Cervical back: He exhibits decreased range of motion, tenderness, pain and spasm. He exhibits no bony tenderness, no swelling, no edema, no deformity and normal pulse.  R shoulder: can actively elevate R arm to 90 degrees only; can passively elevate R arm to 180 degrees.  Lymphadenopathy:    He has no cervical adenopathy.  Neurological: He is alert and oriented to person, place, and time. No cranial nerve deficit.  Skin: Skin is warm and dry. No rash noted. He is not diaphoretic.  Psychiatric: He has a normal mood and affect. His behavior is normal.  Nursing note and vitals reviewed.  Results for orders placed or performed in visit on 04/19/14  CBC with Differential  Result Value Ref Range   WBC 8.9 4.0 - 10.5 K/uL   RBC 5.08 4.22 - 5.81 MIL/uL   Hemoglobin 14.2 13.0 - 17.0 g/dL   HCT 41.5 39.0 - 52.0 %   MCV 81.7 78.0 - 100.0 fL   MCH 28.0 26.0 - 34.0 pg   MCHC 34.2 30.0 - 36.0 g/dL   RDW 14.1 11.5 - 15.5 %   Platelets 265 150 - 400 K/uL   MPV 11.2 9.4 - 12.4 fL   Neutrophils Relative % 85 (H) 43 - 77 %   Neutro Abs 7.6 1.7 - 7.7 K/uL   Lymphocytes Relative 12 12 - 46  %   Lymphs Abs 1.1 0.7 - 4.0 K/uL   Monocytes Relative 3 3 - 12 %   Monocytes Absolute 0.3 0.1 - 1.0 K/uL   Eosinophils Relative 0 0 - 5 %   Eosinophils Absolute 0.0 0.0 - 0.7 K/uL   Basophils Relative 0 0 - 1 %   Basophils Absolute 0.0 0.0 - 0.1 K/uL   Smear Review Criteria for review not met   Comprehensive metabolic panel  Result Value Ref Range   Sodium 137 135 - 145 mEq/L   Potassium 4.9 3.5 - 5.3 mEq/L   Chloride 101 96 - 112 mEq/L   CO2 26 19 - 32 mEq/L   Glucose, Bld 119 (H) 70 - 99 mg/dL   BUN 15 6 - 23 mg/dL   Creat 0.78 0.50 - 1.35 mg/dL   Total  Bilirubin 0.4 0.2 - 1.2 mg/dL   Alkaline Phosphatase 85 39 - 117 U/L   AST 19 0 - 37 U/L   ALT 19 0 - 53 U/L   Total Protein 7.7 6.0 - 8.3 g/dL   Albumin 3.8 3.5 - 5.2 g/dL   Calcium 9.1 8.4 - 10.5 mg/dL  Hemoglobin A1c  Result Value Ref Range   Hgb A1c MFr Bld 5.9 (H) <5.7 %   Mean Plasma Glucose 123 (H) <117 mg/dL  TSH  Result Value Ref Range   TSH 1.254 0.350 - 4.500 uIU/mL       Assessment & Plan:   1. Cervical pain (neck)   2. Pain in joint, shoulder region, right   3. Essential hypertension, benign   4. Glucose intolerance (impaired glucose tolerance)     1. Cervical pain R:  New.  With radiation into R arm.  Continue Prednisone, Flexeril; refill of hydrocodone provided; refer to PT and ortho. 2. R shoulder pain: New.  ED records reviewed in detail; limited ROM of R shoulder yet pain originating in R posterior neck.  Continue treatment plan as outlined. 3. HTN: worsening due to acute pain; improved today; obtain labs; if remains elevated, will need to restart medication. 4.  Glucose intolerance: improved with fifty pound weight loss; encourage continued weight loss.    Meds ordered this encounter  Medications  . predniSONE (DELTASONE) 10 MG tablet    Sig: Take 10 mg by mouth daily with breakfast.  . HYDROcodone-acetaminophen (NORCO/VICODIN) 5-325 MG per tablet    Sig: Take 1-2 tablets by mouth every 6  (six) hours as needed for moderate pain.    Dispense:  40 tablet    Refill:  0    No Follow-up on file.    [name], M.D.  [facility] [address] [place], [place]  [address] [phone] phone [phone] fax

## 2014-04-20 LAB — CBC WITH DIFFERENTIAL/PLATELET
Basophils Absolute: 0 10*3/uL (ref 0.0–0.1)
Basophils Relative: 0 % (ref 0–1)
Eosinophils Absolute: 0 10*3/uL (ref 0.0–0.7)
Eosinophils Relative: 0 % (ref 0–5)
HCT: 41.5 % (ref 39.0–52.0)
HEMOGLOBIN: 14.2 g/dL (ref 13.0–17.0)
LYMPHS ABS: 1.1 10*3/uL (ref 0.7–4.0)
LYMPHS PCT: 12 % (ref 12–46)
MCH: 28 pg (ref 26.0–34.0)
MCHC: 34.2 g/dL (ref 30.0–36.0)
MCV: 81.7 fL (ref 78.0–100.0)
MPV: 11.2 fL (ref 9.4–12.4)
Monocytes Absolute: 0.3 10*3/uL (ref 0.1–1.0)
Monocytes Relative: 3 % (ref 3–12)
NEUTROS ABS: 7.6 10*3/uL (ref 1.7–7.7)
NEUTROS PCT: 85 % — AB (ref 43–77)
Platelets: 265 10*3/uL (ref 150–400)
RBC: 5.08 MIL/uL (ref 4.22–5.81)
RDW: 14.1 % (ref 11.5–15.5)
WBC: 8.9 10*3/uL (ref 4.0–10.5)

## 2014-04-20 LAB — COMPREHENSIVE METABOLIC PANEL
ALT: 19 U/L (ref 0–53)
AST: 19 U/L (ref 0–37)
Albumin: 3.8 g/dL (ref 3.5–5.2)
Alkaline Phosphatase: 85 U/L (ref 39–117)
BILIRUBIN TOTAL: 0.4 mg/dL (ref 0.2–1.2)
BUN: 15 mg/dL (ref 6–23)
CALCIUM: 9.1 mg/dL (ref 8.4–10.5)
CHLORIDE: 101 meq/L (ref 96–112)
CO2: 26 meq/L (ref 19–32)
Creat: 0.78 mg/dL (ref 0.50–1.35)
Glucose, Bld: 119 mg/dL — ABNORMAL HIGH (ref 70–99)
POTASSIUM: 4.9 meq/L (ref 3.5–5.3)
SODIUM: 137 meq/L (ref 135–145)
TOTAL PROTEIN: 7.7 g/dL (ref 6.0–8.3)

## 2014-04-20 LAB — HEMOGLOBIN A1C
HEMOGLOBIN A1C: 5.9 % — AB (ref <5.7)
Mean Plasma Glucose: 123 mg/dL — ABNORMAL HIGH (ref <117)

## 2014-04-20 LAB — TSH: TSH: 1.254 u[IU]/mL (ref 0.350–4.500)

## 2014-05-04 ENCOUNTER — Other Ambulatory Visit: Payer: Self-pay | Admitting: Orthopedic Surgery

## 2014-05-04 DIAGNOSIS — M542 Cervicalgia: Secondary | ICD-10-CM

## 2014-05-21 ENCOUNTER — Ambulatory Visit
Admission: RE | Admit: 2014-05-21 | Discharge: 2014-05-21 | Disposition: A | Discharge status: Home / Self Care | Payer: BLUE CROSS/BLUE SHIELD | Source: Ambulatory Visit | Attending: Orthopedic Surgery | Admitting: Orthopedic Surgery

## 2014-05-21 DIAGNOSIS — M542 Cervicalgia: Secondary | ICD-10-CM

## 2014-05-22 ENCOUNTER — Other Ambulatory Visit: Payer: Self-pay

## 2014-05-22 MED ORDER — MELOXICAM 15 MG PO TABS: 15.0000 mg | ORAL_TABLET | Freq: Every day | ORAL | Status: DC

## 2015-03-01 ENCOUNTER — Ambulatory Visit (INDEPENDENT_AMBULATORY_CARE_PROVIDER_SITE_OTHER): Payer: BLUE CROSS/BLUE SHIELD

## 2015-03-01 ENCOUNTER — Ambulatory Visit (INDEPENDENT_AMBULATORY_CARE_PROVIDER_SITE_OTHER): Payer: BLUE CROSS/BLUE SHIELD | Admitting: Family Medicine

## 2015-03-01 ENCOUNTER — Encounter: Payer: Self-pay | Admitting: Family Medicine

## 2015-03-01 ENCOUNTER — Other Ambulatory Visit: Payer: Self-pay | Admitting: Family Medicine

## 2015-03-01 VITALS — BP 158/99 | HR 73 | Temp 98.3°F | Resp 16 | Ht 69.0 in | Wt 392.2 lb

## 2015-03-01 DIAGNOSIS — M25561 Pain in right knee: Secondary | ICD-10-CM

## 2015-03-01 DIAGNOSIS — M5441 Lumbago with sciatica, right side: Secondary | ICD-10-CM

## 2015-03-01 DIAGNOSIS — S7001XA Contusion of right hip, initial encounter: Secondary | ICD-10-CM

## 2015-03-01 DIAGNOSIS — IMO0001 Reserved for inherently not codable concepts without codable children: Secondary | ICD-10-CM

## 2015-03-01 DIAGNOSIS — Z114 Encounter for screening for human immunodeficiency virus [HIV]: Secondary | ICD-10-CM

## 2015-03-01 DIAGNOSIS — S8001XA Contusion of right knee, initial encounter: Secondary | ICD-10-CM

## 2015-03-01 DIAGNOSIS — M25522 Pain in left elbow: Secondary | ICD-10-CM | POA: Diagnosis not present

## 2015-03-01 DIAGNOSIS — S5002XA Contusion of left elbow, initial encounter: Secondary | ICD-10-CM

## 2015-03-01 DIAGNOSIS — M25551 Pain in right hip: Secondary | ICD-10-CM | POA: Diagnosis not present

## 2015-03-01 DIAGNOSIS — S39012A Strain of muscle, fascia and tendon of lower back, initial encounter: Secondary | ICD-10-CM

## 2015-03-01 DIAGNOSIS — R7302 Impaired glucose tolerance (oral): Secondary | ICD-10-CM | POA: Diagnosis not present

## 2015-03-01 DIAGNOSIS — R030 Elevated blood-pressure reading, without diagnosis of hypertension: Secondary | ICD-10-CM | POA: Diagnosis not present

## 2015-03-01 LAB — COMPREHENSIVE METABOLIC PANEL
ALBUMIN: 4 g/dL (ref 3.6–5.1)
ALT: 14 U/L (ref 9–46)
AST: 16 U/L (ref 10–40)
Alkaline Phosphatase: 93 U/L (ref 40–115)
BILIRUBIN TOTAL: 0.5 mg/dL (ref 0.2–1.2)
BUN: 17 mg/dL (ref 7–25)
CALCIUM: 9 mg/dL (ref 8.6–10.3)
CO2: 26 mmol/L (ref 20–31)
Chloride: 103 mmol/L (ref 98–110)
Creat: 0.81 mg/dL (ref 0.60–1.35)
Glucose, Bld: 116 mg/dL — ABNORMAL HIGH (ref 65–99)
Potassium: 4.4 mmol/L (ref 3.5–5.3)
Sodium: 138 mmol/L (ref 135–146)
Total Protein: 7.5 g/dL (ref 6.1–8.1)

## 2015-03-01 LAB — CBC WITH DIFFERENTIAL/PLATELET
Basophils Absolute: 0 10*3/uL (ref 0.0–0.1)
Basophils Relative: 0 % (ref 0–1)
Eosinophils Absolute: 0.2 10*3/uL (ref 0.0–0.7)
Eosinophils Relative: 2 % (ref 0–5)
HCT: 41 % (ref 39.0–52.0)
HEMOGLOBIN: 14.5 g/dL (ref 13.0–17.0)
LYMPHS ABS: 1.9 10*3/uL (ref 0.7–4.0)
LYMPHS PCT: 16 % (ref 12–46)
MCH: 28.3 pg (ref 26.0–34.0)
MCHC: 35.4 g/dL (ref 30.0–36.0)
MCV: 79.9 fL (ref 78.0–100.0)
MONO ABS: 0.8 10*3/uL (ref 0.1–1.0)
MONOS PCT: 7 % (ref 3–12)
MPV: 11.1 fL (ref 8.6–12.4)
NEUTROS ABS: 8.7 10*3/uL — AB (ref 1.7–7.7)
Neutrophils Relative %: 75 % (ref 43–77)
Platelets: 224 10*3/uL (ref 150–400)
RBC: 5.13 MIL/uL (ref 4.22–5.81)
RDW: 14.2 % (ref 11.5–15.5)
WBC: 11.6 10*3/uL — ABNORMAL HIGH (ref 4.0–10.5)

## 2015-03-01 LAB — LIPID PANEL
Cholesterol: 169 mg/dL (ref 125–200)
HDL: 46 mg/dL (ref >=40)
LDL Cholesterol: 108 mg/dL (ref <130)
Total CHOL/HDL Ratio: 3.7 Ratio (ref <=5.0)
Triglycerides: 74 mg/dL (ref <150)
VLDL: 15 mg/dL (ref <30)

## 2015-03-01 LAB — HEMOGLOBIN A1C
HEMOGLOBIN A1C: 6.4 % — AB (ref <5.7)
MEAN PLASMA GLUCOSE: 137 mg/dL — AB (ref <117)

## 2015-03-01 MED ORDER — CYCLOBENZAPRINE HCL 10 MG PO TABS: 10.0000 mg | ORAL_TABLET | Freq: Three times a day (TID) | ORAL | Status: DC | PRN

## 2015-03-01 MED ORDER — HYDROCODONE-ACETAMINOPHEN 5-325 MG PO TABS: 1.0000 | ORAL_TABLET | Freq: Four times a day (QID) | ORAL | Status: DC | PRN

## 2015-03-01 MED ORDER — MELOXICAM 15 MG PO TABS: 15.0000 mg | ORAL_TABLET | Freq: Every day | ORAL | Status: DC

## 2015-03-01 NOTE — Progress Notes (Signed)
Subjective:    Patient ID: Brett Marshall, male    DOB: [birth year], 49 y.o.   MRN: [id number]  03/01/2015  Hip Injury and Arm Injury   HPI This 49 y.o. male presents for acute visit. [place] [facility] one week ago; has been in a lot of pain for the past 4 days.    1. R hip: from middle of back and radiates down into mid leg on R.  Buttocks; potserior leg; R knee is painful but is improving.  No direct trauma to hip on R.  No n/t/w.  No saddle paresthesias; no b/b dysfunction. Having severe muscle spasms.  Not sleeping.  Walking or standing is painful.    2.  L elbow: landed mostly on R knee; landed on all fours; L elbow pain.  Fell in cement parking lot.  At a car wash and to vacuum hose.  Started falling forward.     Taking Ibuprofen  800mg  twice daily; also using Bengay.  3. Elevated blood pressure: Not checking BP at home but has a cuff.  4. Glucose Intolerance: has lost 50 pounds with Nutrisystem but is currently stuck with weight.   Review of Systems  Constitutional: Negative for fever, chills, diaphoresis, activity change, appetite change and fatigue.  Eyes: Negative for visual disturbance.  Respiratory: Negative for cough and shortness of breath.   Cardiovascular: Negative for chest pain, palpitations and leg swelling.  Endocrine: Negative for cold intolerance, heat intolerance, polydipsia, polyphagia and polyuria.  Musculoskeletal: Positive for myalgias, back pain, joint swelling, arthralgias and gait problem. Negative for neck pain and neck stiffness.  Neurological: Negative for dizziness, tremors, seizures, syncope, facial asymmetry, speech difficulty, weakness, light-headedness, numbness and headaches.    Past Medical History  Diagnosis Date  . Essential hypertension, benign   . Glucose intolerance (impaired glucose tolerance)   . DDD (degenerative disc disease), lumbar   . Low back pain   . Leg swelling     Left  . OSA on CPAP     non compliant  . Depression   . Asthma      History reviewed. No pertinent past surgical history. No Known Allergies  Social History   Social History  . Marital Status: Married    Spouse Name: N/A  . Number of Children: N/A  . Years of Education: N/A   Occupational History  . Dance movement psychotherapist    Social History Main Topics  . Smoking status: Never Smoker   . Smokeless tobacco: Not on file  . Alcohol Use: No  . Drug Use: No  . Sexual Activity: Not on file   Other Topics Concern  . Not on file   Social History Narrative   Goes to gym.   Family History  Problem Relation Age of Onset  . Diabetes Mother   . Asthma Mother   . Heart disease Father   . Heart disease Brother        Objective:    BP 158/99 mmHg  Pulse 73  Temp(Src) 98.3 F (36.8 C) (Oral)  Resp 16  Ht 5\' 9"  (1.753 m)  Wt 392 lb 3.2 oz (177.901 kg)  BMI 57.89 kg/m2 Physical Exam  Constitutional: He is oriented to person, place, and time. He appears well-developed and well-nourished. No distress.  Morbidly obese.  HENT:  Head: Normocephalic and atraumatic.  Right Ear: External ear normal.  Left Ear: External ear normal.  Nose: Nose normal.  Mouth/Throat: Oropharynx is clear and moist.  Eyes: Conjunctivae and EOM are  normal. Pupils are equal, round, and reactive to light.  Neck: Normal range of motion. Neck supple. Carotid bruit is not present. No thyromegaly present.  Cardiovascular: Normal rate, regular rhythm, normal heart sounds and intact distal pulses.  Exam reveals no gallop and no friction rub.   No murmur heard. Pulmonary/Chest: Effort normal and breath sounds normal. He has no wheezes. He has no rales.  Abdominal: Soft. Bowel sounds are normal. He exhibits no distension and no mass. There is no tenderness. There is no rebound and no guarding.  Musculoskeletal:       Right shoulder: Normal. He exhibits normal range of motion, no tenderness, no pain, no spasm, normal pulse and normal strength.       Left shoulder: He exhibits  normal range of motion, no tenderness, no bony tenderness, no pain, no spasm, normal pulse and normal strength.       Left elbow: He exhibits decreased range of motion and swelling. He exhibits no deformity and no laceration. Tenderness found. Radial head tenderness noted. No olecranon process tenderness noted.       Left wrist: Normal. He exhibits normal range of motion, no tenderness and no bony tenderness.       Right hip: He exhibits decreased range of motion, tenderness and bony tenderness. He exhibits normal strength and no swelling.       Right knee: He exhibits decreased range of motion and swelling. He exhibits no effusion, normal patellar mobility and normal meniscus. Tenderness found. Medial joint line tenderness noted. No patellar tendon tenderness noted.       Cervical back: Normal. He exhibits normal range of motion, no tenderness, no bony tenderness and no swelling.       Lumbar back: He exhibits decreased range of motion, tenderness, pain and spasm. He exhibits no bony tenderness and no swelling.       Left forearm: He exhibits tenderness and swelling. He exhibits no deformity and no laceration.       Left hand: Normal. He exhibits normal range of motion, no tenderness and no bony tenderness. Normal sensation noted. Normal strength noted.  Lymphadenopathy:    He has no cervical adenopathy.  Neurological: He is alert and oriented to person, place, and time. No cranial nerve deficit.  Skin: Skin is warm and dry. No rash noted. He is not diaphoretic.  Psychiatric: He has a normal mood and affect. His behavior is normal.  Nursing note and vitals reviewed.   [facility] reading (PRIMARY) by  Dr. [name]. L ELBOW; NAD; L KNEE: NAD; L HIP: NAD; LUMBAR SPINE: DEGENERATIVE CHANGES with severe spurring multilevel; NAD.      Assessment & Plan:   1. Left elbow pain   2. Right knee pain   3. Right-sided low back pain with right-sided sciatica   4. Glucose intolerance (impaired glucose tolerance)    5. Blood pressure elevated   6. Right hip pain   7. Screening for HIV (human immunodeficiency virus)   8. Contusion, knee, right, initial encounter   9. Contusion of left elbow, initial encounter   10. Strain of lumbar paraspinal muscle, initial encounter   11. Contusion of right hip, initial encounter     1.  L elbow pain/contusion: New. Rest, icing, passive range of motion. 2.  R knee pain/contusion: New.  Recommend rest, icing, elevation.  3.  R hip pain/contusion:  New.  Recommend rest, icing, elevation.  Rx for hydrocodone provided. 4.  Lumbar pain/strain: New; with chronic lower back pain;  recommend rest, stretching, hydrocodone, flexeril. 5.  Glucose intolerance: persistent; obtain labs; continue to work on weight loss and exercise. 6.  Blood pressure elevated: worsening.  Recommend monitoring daily at home; acute pain likely contributing; if remains > 140/90 will need to restart medication. 7. Screening HIV; obtain.   Orders Placed This Encounter  Procedures  . DG Elbow Complete Left    Standing Status: Future     Number of Occurrences: 1     Standing Expiration Date: 02/29/2016    Order Specific Question:  Reason for Exam (SYMPTOM  OR DIAGNOSIS REQUIRED)    Answer:  R knee pain after fall; direct trauma to R knee    Order Specific Question:  Preferred imaging location?    Answer:  External  . DG Knee Complete 4 Views Right    Standing Status: Future     Number of Occurrences: 1     Standing Expiration Date: 02/29/2016    Order Specific Question:  Reason for Exam (SYMPTOM  OR DIAGNOSIS REQUIRED)    Answer:  R knee pain after fall; direct trauma to R knee    Order Specific Question:  Preferred imaging location?    Answer:  External  . DG HIP UNILAT W OR W/O PELVIS 2-3 VIEWS RIGHT    Standing Status: Future     Number of Occurrences: 1     Standing Expiration Date: 02/29/2016    Order Specific Question:  Reason for Exam (SYMPTOM  OR DIAGNOSIS REQUIRED)    Answer:  R  hip pain    Order Specific Question:  Preferred imaging location?    Answer:  External  . CBC with Differential/Platelet  . Comprehensive metabolic panel    Order Specific Question:  Has the patient fasted?    Answer:  Yes  . Hemoglobin A1c  . Lipid panel    Order Specific Question:  Has the patient fasted?    Answer:  Yes  . HIV antibody   Meds ordered this encounter  Medications  . cyclobenzaprine (FLEXERIL) 10 MG tablet    Sig: Take 1 tablet (10 mg total) by mouth 3 (three) times daily as needed. For spasms    Dispense:  90 tablet    Refill:  5  . HYDROcodone-acetaminophen (NORCO/VICODIN) 5-325 MG tablet    Sig: Take 1-2 tablets by mouth every 6 (six) hours as needed for moderate pain.    Dispense:  60 tablet    Refill:  0  . meloxicam (MOBIC) 15 MG tablet    Sig: Take 1 tablet (15 mg total) by mouth daily.    Dispense:  30 tablet    Refill:  5    No Follow-up on file.     Orbie Grupe Elayne Guerin, M.D. [facility] [address] [place], [place]  [id number] [phone] phone [phone] fax

## 2016-01-09 ENCOUNTER — Encounter: Payer: Self-pay | Admitting: Family Medicine

## 2016-01-11 ENCOUNTER — Ambulatory Visit (INDEPENDENT_AMBULATORY_CARE_PROVIDER_SITE_OTHER): Payer: Self-pay | Admitting: Family Medicine

## 2016-01-11 VITALS — BP 136/82 | HR 91 | Temp 98.2°F | Ht 69.0 in | Wt 361.0 lb

## 2016-01-11 DIAGNOSIS — M5441 Lumbago with sciatica, right side: Secondary | ICD-10-CM

## 2016-01-11 DIAGNOSIS — N63 Unspecified lump in unspecified breast: Secondary | ICD-10-CM

## 2016-01-11 DIAGNOSIS — E7800 Pure hypercholesterolemia, unspecified: Secondary | ICD-10-CM

## 2016-01-11 DIAGNOSIS — R030 Elevated blood-pressure reading, without diagnosis of hypertension: Secondary | ICD-10-CM | POA: Diagnosis not present

## 2016-01-11 DIAGNOSIS — E119 Type 2 diabetes mellitus without complications: Secondary | ICD-10-CM

## 2016-01-11 DIAGNOSIS — Z114 Encounter for screening for human immunodeficiency virus [HIV]: Secondary | ICD-10-CM | POA: Diagnosis not present

## 2016-01-11 DIAGNOSIS — IMO0001 Reserved for inherently not codable concepts without codable children: Secondary | ICD-10-CM

## 2016-01-11 DIAGNOSIS — N644 Mastodynia: Secondary | ICD-10-CM | POA: Diagnosis not present

## 2016-01-11 DIAGNOSIS — M5030 Other cervical disc degeneration, unspecified cervical region: Secondary | ICD-10-CM

## 2016-01-11 LAB — COMPREHENSIVE METABOLIC PANEL
ALT: 25 U/L (ref 9–46)
AST: 17 U/L (ref 10–35)
Albumin: 4 g/dL (ref 3.6–5.1)
Alkaline Phosphatase: 120 U/L — ABNORMAL HIGH (ref 40–115)
BUN: 9 mg/dL (ref 7–25)
CO2: 26 mmol/L (ref 20–31)
CREATININE: 0.78 mg/dL (ref 0.70–1.33)
Calcium: 9.4 mg/dL (ref 8.6–10.3)
Chloride: 99 mmol/L (ref 98–110)
Glucose, Bld: 327 mg/dL — ABNORMAL HIGH (ref 65–99)
Potassium: 5.2 mmol/L (ref 3.5–5.3)
SODIUM: 135 mmol/L (ref 135–146)
Total Bilirubin: 0.5 mg/dL (ref 0.2–1.2)
Total Protein: 7.6 g/dL (ref 6.1–8.1)

## 2016-01-11 LAB — CBC WITH DIFFERENTIAL/PLATELET
BASOS ABS: 0 {cells}/uL (ref 0–200)
Basophils Relative: 0 %
EOS PCT: 2 %
Eosinophils Absolute: 186 cells/uL (ref 15–500)
HCT: 44.9 % (ref 38.5–50.0)
Hemoglobin: 15 g/dL (ref 13.2–17.1)
Lymphocytes Relative: 27 %
Lymphs Abs: 2511 cells/uL (ref 850–3900)
MCH: 28.5 pg (ref 27.0–33.0)
MCHC: 33.4 g/dL (ref 32.0–36.0)
MCV: 85.4 fL (ref 80.0–100.0)
MONOS PCT: 6 %
MPV: 12.4 fL (ref 7.5–12.5)
Monocytes Absolute: 558 cells/uL (ref 200–950)
NEUTROS PCT: 65 %
Neutro Abs: 6045 cells/uL (ref 1500–7800)
PLATELETS: 229 10*3/uL (ref 140–400)
RBC: 5.26 MIL/uL (ref 4.20–5.80)
RDW: 13.6 % (ref 11.0–15.0)
WBC: 9.3 10*3/uL (ref 3.8–10.8)

## 2016-01-11 LAB — POCT URINALYSIS DIP (MANUAL ENTRY)
Bilirubin, UA: NEGATIVE
Glucose, UA: 500 — AB
Ketones, POC UA: NEGATIVE
Nitrite, UA: NEGATIVE
PH UA: 6.5
Protein Ur, POC: 100 — AB
Spec Grav, UA: 1.015
UROBILINOGEN UA: 0.2

## 2016-01-11 LAB — POCT GLYCOSYLATED HEMOGLOBIN (HGB A1C): Hemoglobin A1C: >=14

## 2016-01-11 LAB — LIPID PANEL
Cholesterol: 163 mg/dL (ref 125–200)
HDL: 47 mg/dL (ref >=40)
LDL CALC: 99 mg/dL (ref <130)
Total CHOL/HDL Ratio: 3.5 Ratio (ref <=5.0)
Triglycerides: 83 mg/dL (ref <150)
VLDL: 17 mg/dL (ref <30)

## 2016-01-11 LAB — HIV ANTIBODY (ROUTINE TESTING W REFLEX): HIV 1&2 Ab, 4th Generation: NONREACTIVE

## 2016-01-11 LAB — GLUCOSE, POCT (MANUAL RESULT ENTRY): POC GLUCOSE: 332 mg/dL — AB (ref 70–99)

## 2016-01-11 MED ORDER — HYDROCODONE-ACETAMINOPHEN 5-325 MG PO TABS: 1.0000 | ORAL_TABLET | Freq: Four times a day (QID) | ORAL | 0 refills | Status: DC | PRN

## 2016-01-11 MED ORDER — METFORMIN HCL 500 MG PO TABS: 500.0000 mg | ORAL_TABLET | Freq: Two times a day (BID) | ORAL | 1 refills | Status: DC

## 2016-01-11 MED ORDER — GLUCOSE BLOOD VI STRP: ORAL_STRIP | 12 refills | Status: DC

## 2016-01-11 MED ORDER — ONETOUCH ULTRASOFT LANCETS MISC: 12 refills | Status: AC

## 2016-01-11 MED ORDER — MELOXICAM 15 MG PO TABS: 15.0000 mg | ORAL_TABLET | Freq: Every day | ORAL | 5 refills | Status: DC

## 2016-01-11 MED ORDER — CYCLOBENZAPRINE HCL 10 MG PO TABS: 10.0000 mg | ORAL_TABLET | Freq: Three times a day (TID) | ORAL | 5 refills | Status: DC | PRN

## 2016-01-11 MED ORDER — GLIPIZIDE ER 5 MG PO TB24: 5.0000 mg | ORAL_TABLET | Freq: Two times a day (BID) | ORAL | 0 refills | Status: DC

## 2016-01-11 MED ORDER — DOXYCYCLINE HYCLATE 100 MG PO CAPS: 100.0000 mg | ORAL_CAPSULE | Freq: Two times a day (BID) | ORAL | 0 refills | Status: DC

## 2016-01-11 NOTE — Patient Instructions (Addendum)
IF you received an x-ray today, you will receive an invoice from [facility]. Please contact [facility] at [phone] with questions or concerns regarding your invoice.   IF you received labwork today, you will receive an invoice from [employer]. Please contact [facility] at [phone] with questions or concerns regarding your invoice.   Our billing staff will not be able to assist you with questions regarding bills from these companies.  You will be contacted with the lab results as soon as they are available. The fastest way to get your results is to activate your My Chart account. Instructions are located on the last page of this paperwork. If you have not heard from [place] regarding the results in 2 weeks, please contact this office.     Basic Carbohydrate Counting for Diabetes Mellitus Carbohydrate counting is a method for keeping track of the amount of carbohydrates you eat. Eating carbohydrates naturally increases the level of sugar (glucose) in your blood, so it is important for you to know the amount that is okay for you to have in every meal. Carbohydrate counting helps keep the level of glucose in your blood within normal limits. The amount of carbohydrates allowed is different for every person. A dietitian can help you calculate the amount that is right for you. Once you know the amount of carbohydrates you can have, you can count the carbohydrates in the foods you want to eat. Carbohydrates are found in the following foods:  Grains, such as breads and cereals.  Dried beans and soy products.  Starchy vegetables, such as potatoes, peas, and corn.  Fruit and fruit juices.  Milk and yogurt.  Sweets and snack foods, such as cake, cookies, candy, chips, soft drinks, and fruit drinks. CARBOHYDRATE COUNTING There are two ways to count the carbohydrates in your food. You can use either of the methods or a combination of both. Reading  the "Nutrition Facts" on [employer] The "Nutrition Facts" is an area that is included on the labels of almost all packaged food and beverages in the [place]. It includes the serving size of that food or beverage and information about the nutrients in each serving of the food, including the grams (g) of carbohydrate per serving.  Decide the number of servings of this food or beverage that you will be able to eat or drink. Multiply that number of servings by the number of grams of carbohydrate that is listed on the label for that serving. The total will be the amount of carbohydrates you will be having when you eat or drink this food or beverage. Learning Standard Serving Sizes of Food When you eat food that is not packaged or does not include "Nutrition Facts" on the label, you need to measure the servings in order to count the amount of carbohydrates.A serving of most carbohydrate-rich foods contains about 15 g of carbohydrates. The following list includes serving sizes of carbohydrate-rich foods that provide 15 g ofcarbohydrate per serving:   1 slice of bread (1 oz) or 1 six-inch tortilla.    of a hamburger bun or English muffin.  4-6 crackers.   cup unsweetened dry cereal.    cup hot cereal.   cup rice or pasta.    cup mashed potatoes or  of a large baked potato.  1 cup fresh fruit or one small piece of fruit.    cup canned or frozen fruit or fruit juice.  1 cup milk.   cup  plain fat-free yogurt or yogurt sweetened with artificial sweeteners.   cup cooked dried beans or starchy vegetable, such as peas, corn, or potatoes.  Decide the number of standard-size servings that you will eat. Multiply that number of servings by 15 (the grams of carbohydrates in that serving). For example, if you eat 2 cups of strawberries, you will have eaten 2 servings and 30 g of carbohydrates (2 servings x 15 g = 30 g). For foods such as soups and casseroles, in which more than one  food is mixed in, you will need to count the carbohydrates in each food that is included. EXAMPLE OF CARBOHYDRATE COUNTING Sample Dinner  3 oz chicken breast.   cup of brown rice.   cup of corn.  1 cup milk.   1 cup strawberries with sugar-free whipped topping.  Carbohydrate Calculation Step 1: Identify the foods that contain carbohydrates:   Rice.   Corn.   Milk.   Strawberries. Step 2:Calculate the number of servings eaten of each:   2 servings of rice.   1 serving of corn.   1 serving of milk.   1 serving of strawberries. Step 3: Multiply each of those number of servings by 15 g:   2 servings of rice x 15 g = 30 g.   1 serving of corn x 15 g = 15 g.   1 serving of milk x 15 g = 15 g.   1 serving of strawberries x 15 g = 15 g. Step 4: Add together all of the amounts to find the total grams of carbohydrates eaten: 30 g + 15 g + 15 g + 15 g = 75 g.   This information is not intended to replace advice given to you by your health care provider. Make sure you discuss any questions you have with your health care provider.   Document Released: 05/04/2005 Document Revised: 05/25/2014 Document Reviewed: 03/31/2013 Elsevier Interactive Patient Education [employer].

## 2016-01-11 NOTE — Progress Notes (Signed)
Subjective:  By signing my name below, I, [name], attest that this documentation has been prepared under the direction and in the presence of [name], MD.  Electronically Signed: [name], ED Scribe. 01/11/2016. 3:54 PM.   Patient ID: Brett Marshall, male    DOB: July 07, 1965, 50 y.o.   MRN: [id number]  01/11/2016  Breast Problem (right)  HPI   Chief Complaint  Patient presents with  . Breast Problem    right    HPI Comments: Brett Marshall is a 50 y.o. male who presents to the [facility] for an acute visit:  Right breast lump Lump to right breat noticed 3 day ago. Pt denies trauma to right nipple. He reports pain with touch and pressure, but no itching. He denies family hx of breast cancer. No pain in axilla adenopathy. He denies fever, chills, sweats and nipple discharge. Pt denies drug allergies.   Glucose Intolerance and Morbid Obesity Wt Readings from Last 3 Encounters:  01/11/16 (!) 361 lb (163.7 kg)  03/01/15 (!) 392 lb 3.2 oz (177.9 kg)  05/21/14 (!) 391 lb (177.4 kg)   Pt has lost 30 lbs since last visit. He has been eating smaller portions, but has not changed his diet. Was previously using Nutrisystem for weight loss; now cutting back.  Breakfast: 4 slices of toast with PB&J with water or diet mountain Dew Snack- occasionally will have a snack. Will eat either grapes, cinnamon roll, or a donut Lunch: sandwich or frozen meal like a potpie, occasionally chip with  water or diet mountain dew   Snack:occasionally will have a snack. Will eat either grapes, cinnamon roll, or a donut Dinner: varies, either sandwich, frozen meal, or eating out.   Pt urinates 1-2 time during the night.   Allergies  Pt has some postnasal drainage. Usually takes Claritin. Pt does not use flonase. Has chest congestion as well currently; no SOB; no fever.  Chronic back pain Pt complains of right low back pain. Pain is worse at night making it difficult for  him to sleep.  He is requesting a refill of flexeril, meloxicam, hydrocodone.  Not ready to undergo surgical intervention; last visit with Dr. [name] was 05/2014.  Denies n/t/w in legs; denies saddle paresthesias or b/b dysfunction.  Right sided HA Pt also complains of right sided HA with right neck pain. 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  Left posterior lateral disc bulge at C5-6 with mild foraminal  narrowing on the left.  Left posterior lateral to foraminal disc herniation at C6-7 which  could affect the left C7 nerve root.  He denies numbness tingling and burning in extremites.   Review of Systems  Constitutional: Negative for activity change, appetite change, chills, diaphoresis, fatigue and fever.  Eyes: Negative for visual disturbance.  Respiratory: Negative for cough and shortness of breath.   Cardiovascular: Negative for chest pain, palpitations and leg swelling.  Gastrointestinal: Negative for abdominal pain, diarrhea, nausea and vomiting.  Endocrine: Negative for cold intolerance, heat intolerance, polydipsia, polyphagia and polyuria.  Genitourinary: Negative for frequency and urgency.  Musculoskeletal: Positive for back pain, myalgias and neck pain. Negative for gait problem, joint swelling and neck stiffness.  Skin: Negative for color change, pallor, rash and wound.  Allergic/Immunologic: Positive for environmental allergies.  Neurological: Positive for headaches. Negative for dizziness, tremors, seizures, syncope,  facial asymmetry, speech difficulty, weakness, light-headedness and numbness.  Hematological: Negative for adenopathy.    Past Medical History:  Diagnosis Date  . Asthma   . DDD (degenerative disc disease), lumbar   . Depression   . Essential hypertension, benign   . Glucose intolerance (impaired glucose tolerance)   .  Leg swelling    Left  . Low back pain   . OSA on CPAP    non compliant   History reviewed. No pertinent surgical history. No Known Allergies  Social History   Social History  . Marital status: Married    Spouse name: N/A  . Number of children: N/A  . Years of education: N/A   Occupational History  . 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 Smoking status: Never Smoker  . Smokeless tobacco: Not on file  . Alcohol use No  . Drug use: No  . Sexual activity: Not on file   Other Topics Concern  . Not on file   Social History Narrative   Goes to gym.   Family History  Problem Relation Age of Onset  . Diabetes Mother   . Asthma Mother   . Heart disease Father   . Heart disease Brother    Objective:    BP 136/82 (BP Location: Left Arm, Patient Position: Sitting, Cuff Size: Large)   Pulse 91   Temp 98.2 F (36.8 C) (Oral)   Ht 5\' 9"  (1.753 m)   Wt (!) 361 lb (163.7 kg)   SpO2 97%   BMI 53.31 kg/m  Physical Exam  Constitutional: He is oriented to person, place, and time. He appears well-developed and well-nourished. No distress.  HENT:  Head: Normocephalic and atraumatic.  Right Ear: External ear normal.  Left Ear: External ear normal.  Nose: Nose normal.  Mouth/Throat: Oropharynx is clear and moist.  Eyes: Conjunctivae and EOM are normal. Pupils are equal, round, and reactive to light.  Neck: Normal range of motion. Neck supple. Carotid bruit is not present. No thyromegaly present.  Cardiovascular: Normal rate, regular rhythm, normal heart sounds and intact distal pulses.  Exam reveals no gallop and no friction rub.   No murmur heard. Pulmonary/Chest: Effort normal and breath sounds normal. He has no wheezes. He has no rales.    Abdominal: Soft. Bowel sounds are normal. He exhibits no distension and no mass. There is no tenderness. There is no rebound and no guarding.  Musculoskeletal: Normal range of motion.       Cervical back: He exhibits  pain and spasm. He exhibits normal range of motion, no tenderness and no bony tenderness.  Full ROM cpsine with out limitations or pain.   Lymphadenopathy:    He has no cervical adenopathy.    He has no axillary adenopathy.  Neurological: He is alert and oriented to person, place, and time. No cranial nerve deficit.  Skin: Skin is warm and dry. No rash noted. He is not diaphoretic.  Right breast 1 sonometer in diameter area of induration no associated ery bu there is scaling of skin.   Psychiatric: He has a normal mood and affect. His behavior is normal.  Nursing note and vitals reviewed.  Results for orders placed or performed in visit on 01/11/16  POCT urinalysis dipstick  Result Value Ref Range   Color, UA yellow yellow   Clarity, UA hazy (A) clear   Glucose, UA =500 (A) negative   Bilirubin, UA negative negative   Ketones, POC UA negative  negative   Spec Grav, UA 1.015    Blood, UA small (A) negative   pH, UA 6.5    Protein Ur, POC =100 (A) negative   Urobilinogen, UA 0.2    Nitrite, UA Negative Negative   Leukocytes, UA small (1+) (A) Negative  POCT glucose (manual entry)  Result Value Ref Range   POC Glucose 332 (A) 70 - 99 mg/dl  POCT glycosylated hemoglobin (Hb A1C)  Result Value Ref Range   Hemoglobin A1C =>14.0     Assessment & Plan:   1. Type 2 diabetes mellitus without complication, without long-term current use of insulin ([place])   2. Breast nodule   3. Pure hypercholesterolemia   4. Elevated blood pressure   5. Right-sided low back pain with right-sided sciatica   6. Screening for HIV (human immunodeficiency virus)   7. Pain of right breast   8. Degenerative disc disease, cervical    1) Breast nodule: New, start doxycycline for cellulitis/folliculitis. Ultrasound breast and diagnostic mammogram to be scheduled for Monday or Tuesday. 2) Type 2 Diabetes- New, Start glipizide and metformin, recommend cutting back sugars and increase protein. Also recommended  diabetic counseling and nutrition counseling. Monitor blood sugars at home. Keep a log/spread sheet of sugars.   Recommend weight loss, exercise, dietary modification. RTC one month. 3) Lumbar pain: Chronic, recommend stretching, hydrocodone and flexeril and mobic refilled; s/p ortho consultation in the past. 4) Blood pressure- improved since last visit.   5.  DDD cervical spine: persistent; s/p MRI cervical spine in 2016 with several herniated disc with nerve compression; may warrant follow-up with ortho.  6. Obesity: improving yet hyperglycemia likely contributing to weight loss.  Orders Placed This Encounter  Procedures  . MM Digital Diagnostic Unilat R    Standing Status:   Future    Standing Expiration Date:   03/12/2017    Scheduling Instructions:     Please schedule Monday or Tuesday    Order Specific Question:   Reason for Exam (SYMPTOM  OR DIAGNOSIS REQUIRED)    Answer:   R breast pain and mass 12 oclock superior to areola    Order Specific Question:   Preferred imaging location?    Answer:   [facility] [place]  . US BREAST COMPLETE UNI RIGHT INC AXILLA    Standing Status:   Future    Standing Expiration Date:   03/12/2017    Order Specific Question:   Reason for Exam (SYMPTOM  OR DIAGNOSIS REQUIRED)    Answer:   R breast pain and nodule 1 cm at 12 ockock superiro to areola    Order Specific Question:   Preferred imaging location?    Answer:   [facility] [place]  . CBC with Differential/Platelet  . Comprehensive metabolic panel    Order Specific Question:   Has the patient fasted?    Answer:   Yes  . Lipid panel    Order Specific Question:   Has the patient fasted?    Answer:   Yes  . HIV antibody  . Ambulatory referral to diabetic education    Referral Priority:   Routine    Referral Type:   Consultation    Referral Reason:   Specialty Services Required    Number of Visits Requested:   1  . POCT urinalysis dipstick  . POCT glucose (manual entry)  . POCT glycosylated  hemoglobin (Hb A1C)    Meds ordered this encounter  Medications  . doxycycline (VIBRAMYCIN) 100 MG capsule  Sig: Take 1 capsule (100 mg total) by mouth 2 (two) times daily.    Dispense:  20 capsule    Refill:  0  . HYDROcodone-acetaminophen (NORCO/VICODIN) 5-325 MG tablet    Sig: Take 1-2 tablets by mouth every 6 (six) hours as needed for moderate pain.    Dispense:  60 tablet    Refill:  0  . cyclobenzaprine (FLEXERIL) 10 MG tablet    Sig: Take 1 tablet (10 mg total) by mouth 3 (three) times daily as needed. For spasms    Dispense:  90 tablet    Refill:  5  . meloxicam (MOBIC) 15 MG tablet    Sig: Take 1 tablet (15 mg total) by mouth daily.    Dispense:  30 tablet    Refill:  5  . metFORMIN (GLUCOPHAGE) 500 MG tablet    Sig: Take 1 tablet (500 mg total) by mouth 2 (two) times daily with a meal.    Dispense:  180 tablet    Refill:  1  . glipiZIDE (GLUCOTROL XL) 5 MG 24 hr tablet    Sig: Take 1 tablet (5 mg total) by mouth 2 (two) times daily.    Dispense:  180 tablet    Refill:  0  . glucose blood test strip    Sig: Check sugars twice daily; ONE TOUCH METER    Dispense:  100 each    Refill:  12  . Lancets (ONETOUCH ULTRASOFT) lancets    Sig: Check sugar twice daily    Dispense:  100 each    Refill:  12   No Follow-up on file.  I personally performed the services described in this documentation, which was scribed in my presence. The recorded information has been reviewed and considered.  Takya Vandivier Elayne Guerin, M.D. [facility] [address] [place], [place]  [id number] [phone] phone [phone] fax

## 2016-01-13 ENCOUNTER — Encounter: Payer: Self-pay | Admitting: Family Medicine

## 2016-01-13 ENCOUNTER — Telehealth: Payer: Self-pay

## 2016-01-13 NOTE — Telephone Encounter (Signed)
Pt is calling about his mammogram referral. He stated the [facility] in [facility] stated the incorrect order sent and they need a different order. Pt is not sure what that order is however he gave the number to the center. [phone]  Please advise

## 2016-01-14 NOTE — Telephone Encounter (Signed)
Left a message with the imaging center at [facility] to return my call, so we can get the correct orders placed for the patient.  [facility]: [phone]

## 2016-01-15 ENCOUNTER — Telehealth: Payer: Self-pay

## 2016-01-15 DIAGNOSIS — N63 Unspecified lump in unspecified breast: Secondary | ICD-10-CM

## 2016-01-15 NOTE — Telephone Encounter (Signed)
Ultrasound orders need to be changed from unilateral to bilateral also.  Thanks!

## 2016-01-15 NOTE — Telephone Encounter (Signed)
The orders for the patient's diagnostic mammogram and breast ultrasound needs to be changed to bilateral.  It is currently ordered as unilateral.  Please change the order, so we can schedule the patient.  Thank you.  Patient also states the [place] imaging center does not do breast imaging, so it will need to be sent to the [facility] instead.

## 2016-01-15 NOTE — Telephone Encounter (Signed)
Ok

## 2016-01-15 NOTE — Telephone Encounter (Signed)
Order changed.

## 2016-01-17 NOTE — Telephone Encounter (Signed)
Please call [facility] to schedule diagnostic mammogram and breast US sooner than 01/30/16.

## 2016-01-29 ENCOUNTER — Telehealth: Payer: Self-pay

## 2016-01-29 NOTE — Telephone Encounter (Signed)
Patient stated he was diagnosed with diabetes. Patient request for test strips and a meter to be called into pharmacy. Patient is currently using GE 100 test strips. [employer] in [place], [place]

## 2016-01-30 ENCOUNTER — Ambulatory Visit
Admission: RE | Admit: 2016-01-30 | Discharge: 2016-01-30 | Disposition: A | Discharge status: Home / Self Care | Payer: 59 | Source: Ambulatory Visit | Attending: Family Medicine | Admitting: Family Medicine

## 2016-01-30 ENCOUNTER — Encounter: Payer: 59 | Attending: Family Medicine | Admitting: *Deleted

## 2016-01-30 ENCOUNTER — Encounter: Payer: Self-pay | Admitting: *Deleted

## 2016-01-30 ENCOUNTER — Other Ambulatory Visit: Payer: Self-pay | Admitting: Family Medicine

## 2016-01-30 VITALS — BP 166/94 | Ht 68.0 in | Wt 364.8 lb

## 2016-01-30 DIAGNOSIS — N63 Unspecified lump in unspecified breast: Secondary | ICD-10-CM

## 2016-01-30 DIAGNOSIS — Z6841 Body Mass Index (BMI) 40.0 and over, adult: Secondary | ICD-10-CM | POA: Diagnosis not present

## 2016-01-30 DIAGNOSIS — Z713 Dietary counseling and surveillance: Secondary | ICD-10-CM | POA: Insufficient documentation

## 2016-01-30 DIAGNOSIS — E119 Type 2 diabetes mellitus without complications: Secondary | ICD-10-CM

## 2016-01-30 DIAGNOSIS — N62 Hypertrophy of breast: Secondary | ICD-10-CM | POA: Insufficient documentation

## 2016-01-30 NOTE — Patient Instructions (Signed)
Check blood sugars 2 x day before breakfast and 2 hrs after supper every day  Exercise: Begin walking  for  10 minutes  3 days a week and gradually increase to 30 minutes 5 days a week  Eat 3 meals day,2-3 snacks a day Space meals 4-6 hours apart Don't skip meals Avoid sugar sweetened drinks (soda, tea)  Make an eye doctor appointment  Bring blood sugar records to the next class  Return for classes on:

## 2016-01-30 NOTE — Progress Notes (Signed)
Diabetes Self-Management Education  Visit Type: First/Initial  Appt. Start Time: 1330 Appt. End Time: [id number]  01/30/2016  Mr. Brett Marshall, identified by name and date of birth, is a 50 y.o. male with a diagnosis of Diabetes: Type 2.   ASSESSMENT  Blood pressure (!) 166/94, height 5\' 8"  (1.727 m), weight (!) 364 lb 12.8 oz (165.5 kg). Body mass index is 55.47 kg/m.      Diabetes Self-Management Education - 01/30/16 1515      Visit Information   Visit Type First/Initial     Initial Visit   Diabetes Type Type 2   Are you currently following a meal plan? Yes   What type of meal plan do you follow? decreased portions - used Nutrisystem in 2015-2016 and lost 80 lbs but lost his job and couldn't afford   Are you taking your medications as prescribed? Yes   Date Diagnosed 3 weeks ago     Health Coping   How would you rate your overall health? Good     Psychosocial Assessment   Patient Belief/Attitude about Diabetes Motivated to manage diabetes   Self-care barriers None   Self-management support Doctor's office;Family   Other persons present Spouse/SO   Patient Concerns Nutrition/Meal planning;Weight Control;Glycemic Control   Special Needs None   Preferred Learning Style Visual;Hands on   Learning Readiness Change in progress   How often do you need to have someone help you when you read instructions, pamphlets, or other written materials from your doctor or pharmacy? 1 - Never   What is the last grade level you completed in school? AAS     Pre-Education Assessment   Patient understands the diabetes disease and treatment process. Needs Instruction   Patient understands incorporating nutritional management into lifestyle. Needs Instruction   Patient undertands incorporating physical activity into lifestyle. Needs Instruction   Patient understands using medications safely. Needs Instruction   Patient understands monitoring blood glucose, interpreting and using results Needs  Review   Patient understands prevention, detection, and treatment of acute complications. Needs Instruction   Patient understands prevention, detection, and treatment of chronic complications. Needs Instruction   Patient understands how to develop strategies to address psychosocial issues. Needs Instruction   Patient understands how to develop strategies to promote health/change behavior. Needs Instruction     Complications   Last HgB A1C per patient/outside source 14 %  01/11/16   How often do you check your blood sugar? 1-2 times/day   Fasting Blood glucose range (mg/dL) 70-129;130-179  FBG's last 5 days - 120-141. Readings in 200's mg/dL on 01/12/16.   Postprandial Blood glucose range (mg/dL) 130-179;70-129  pp's last 5 days - 114-175 mg/dL. Readings in 200's mg/dL on 01/12/16.   Have you had a dilated eye exam in the past 12 months? No   Have you had a dental exam in the past 12 months? Yes   Are you checking your feet? No     Dietary Intake   Breakfast yogurt; eggs; biscuit with bacon, egg and cheese; toast and peanut butter; bagel; occasional pancake   Snack (morning) fruit, chips   Lunch sometimes skips; sandwich, pot pie   Snack (afternoon) fruit, chips   Dinner fast food - burger, chicken nuggets, pizza, spaghetti, meatloaf, chicken and broccolli   Snack (evening) occasional ice cream   Beverage(s) water, diet soda, sometimes sweet tea or regular soda     Exercise   Exercise Type ADL's     Patient Education   Previous Diabetes  Education No   Disease state  Definition of diabetes, type 1 and 2, and the diagnosis of diabetes;Factors that contribute to the development of diabetes   Nutrition management  Role of diet in the treatment of diabetes and the relationship between the three main macronutrients and blood glucose level;Food label reading, portion sizes and measuring food.   Physical activity and exercise  Role of exercise on diabetes management, blood pressure control and  cardiac health.   Medications Reviewed patients medication for diabetes, action, purpose, timing of dose and side effects.   Monitoring Purpose and frequency of SMBG.;Identified appropriate SMBG and/or A1C goals.   Chronic complications Relationship between chronic complications and blood glucose control   Psychosocial adjustment Identified and addressed patients feelings and concerns about diabetes;Other (comment)  Role of pain on diabetes     Individualized Goals (developed by patient)   Reducing Risk Improve blood sugars Lose weight     Outcomes   Expected Outcomes Demonstrated interest in learning. Expect positive outcomes   Future DMSE 4-6 wks      Individualized Plan for Diabetes Self-Management Training:   Learning Objective:  Patient will have a greater understanding of diabetes self-management. Patient education plan is to attend individual and/or group sessions per assessed needs and concerns.   Plan:   Patient Instructions  Check blood sugars 2 x day before breakfast and 2 hrs after supper every day Exercise: Begin walking  for  10 minutes  3 days a week and gradually increase to 30 minutes 5 days a week Eat 3 meals day,2-3 snacks a day Space meals 4-6 hours apart Don't skip meals Avoid sugar sweetened drinks (soda, tea) Make an eye doctor appointment Bring blood sugar records to the next class   Expected Outcomes:  Demonstrated interest in learning. Expect positive outcomes  Education material provided:  General Meal Planning Guidelines Simple Meal Plan Lancet device and lancets  If problems or questions, patient to contact team via:  Brett Marshall, [place], [place], CDE [phone]  Future DSME appointment: 4-6 wks  March 09, 2016 for Diabetes Class 1

## 2016-02-03 ENCOUNTER — Telehealth: Payer: Self-pay | Admitting: Emergency Medicine

## 2016-02-03 MED ORDER — GLUCOSE BLOOD VI STRP: ORAL_STRIP | 12 refills | Status: AC

## 2016-02-03 NOTE — Telephone Encounter (Signed)
Test strips changed to GE strips due to insurance coverage Medication e-scribed to pharmacy

## 2016-02-03 NOTE — Telephone Encounter (Signed)
Pt requesting test strip refills. Strips ordered on 8/28 insurance do not cover Need GE- 100 strips. Will update

## 2016-02-08 ENCOUNTER — Ambulatory Visit (INDEPENDENT_AMBULATORY_CARE_PROVIDER_SITE_OTHER): Payer: 59 | Admitting: Family Medicine

## 2016-02-08 VITALS — BP 132/80 | HR 93 | Temp 98.0°F | Resp 17 | Ht 69.5 in | Wt 372.0 lb

## 2016-02-08 DIAGNOSIS — E119 Type 2 diabetes mellitus without complications: Secondary | ICD-10-CM | POA: Insufficient documentation

## 2016-02-08 DIAGNOSIS — N62 Hypertrophy of breast: Secondary | ICD-10-CM | POA: Diagnosis not present

## 2016-02-08 LAB — COMPREHENSIVE METABOLIC PANEL
ALBUMIN: 3.8 g/dL (ref 3.6–5.1)
ALK PHOS: 86 U/L (ref 40–115)
ALT: 15 U/L (ref 9–46)
AST: 15 U/L (ref 10–35)
BILIRUBIN TOTAL: 0.3 mg/dL (ref 0.2–1.2)
BUN: 12 mg/dL (ref 7–25)
CALCIUM: 8.7 mg/dL (ref 8.6–10.3)
CO2: 25 mmol/L (ref 20–31)
Chloride: 106 mmol/L (ref 98–110)
Creat: 0.66 mg/dL — ABNORMAL LOW (ref 0.70–1.33)
GLUCOSE: 141 mg/dL — AB (ref 65–99)
Potassium: 4.5 mmol/L (ref 3.5–5.3)
Sodium: 140 mmol/L (ref 135–146)
TOTAL PROTEIN: 6.8 g/dL (ref 6.1–8.1)

## 2016-02-08 LAB — CBC WITH DIFFERENTIAL/PLATELET
BASOS PCT: 1 %
Basophils Absolute: 108 cells/uL (ref 0–200)
Eosinophils Absolute: 324 cells/uL (ref 15–500)
Eosinophils Relative: 3 %
HEMATOCRIT: 38.8 % (ref 38.5–50.0)
HEMOGLOBIN: 13 g/dL — AB (ref 13.2–17.1)
LYMPHS ABS: 2592 {cells}/uL (ref 850–3900)
Lymphocytes Relative: 24 %
MCH: 28.1 pg (ref 27.0–33.0)
MCHC: 33.5 g/dL (ref 32.0–36.0)
MCV: 83.8 fL (ref 80.0–100.0)
MONO ABS: 648 {cells}/uL (ref 200–950)
MPV: 11.5 fL (ref 7.5–12.5)
Monocytes Relative: 6 %
Neutro Abs: 7128 cells/uL (ref 1500–7800)
Neutrophils Relative %: 66 %
Platelets: 229 10*3/uL (ref 140–400)
RBC: 4.63 MIL/uL (ref 4.20–5.80)
RDW: 13.4 % (ref 11.0–15.0)
WBC: 10.8 10*3/uL (ref 3.8–10.8)

## 2016-02-08 LAB — POCT URINE PREGNANCY: Preg Test, Ur: NEGATIVE

## 2016-02-08 LAB — LUTEINIZING HORMONE: LH: 4.4 m[IU]/mL (ref 1.5–9.3)

## 2016-02-08 LAB — TSH: TSH: 2.82 mIU/L (ref 0.40–4.50)

## 2016-02-08 LAB — TESTOSTERONE: TESTOSTERONE: 131 ng/dL — AB (ref 250–827)

## 2016-02-08 LAB — GLUCOSE, POCT (MANUAL RESULT ENTRY): POC Glucose: 142 mg/dl — AB (ref 70–99)

## 2016-02-08 MED ORDER — METFORMIN HCL 500 MG PO TABS: 1000.0000 mg | ORAL_TABLET | Freq: Two times a day (BID) | ORAL | 1 refills | Status: DC

## 2016-02-08 NOTE — Patient Instructions (Signed)
     IF you received an x-ray today, you will receive an invoice from [facility]. Please contact [facility] at [phone] with questions or concerns regarding your invoice.   IF you received labwork today, you will receive an invoice from [facility]. Please contact [facility] at [phone] with questions or concerns regarding your invoice.   Our billing staff will not be able to assist you with questions regarding bills from these companies.  You will be contacted with the lab results as soon as they are available. The fastest way to get your results is to activate your My Chart account. Instructions are located on the last page of this paperwork. If you have not heard from us regarding the results in 2 weeks, please contact this office.      

## 2016-02-08 NOTE — Progress Notes (Signed)
By signing my name below, I, Mesha Guinyard, attest that this documentation has been prepared under the direction and in the presence of [name], MD.  Electronically Signed: Verlee Monte, Medical Scribe. 02/08/16. 11:50 AM.  Subjective:    Patient ID: Brett Marshall, male    DOB: 1966/03/24, 50 y.o.   MRN: [id number]  02/08/2016  Diabetes and lump on right side chest  HPI  HPI Comments: Brett Marshall is a 50 y.o. male who presents to the [facility] for follow-up. I last saw him for rt brest nodule and pain. I prescribed doxycycline for cellulitis and sent him to get a mammogram, which was negative. I dx him as well with new onset DMII, prescribed Metformin and Glipizide, and advised him to follow-up in one month.  Breast R pain:  S/p mammogram that revealed gynecomastia.  Pt was advised by radiologist to perform self breast exams regularly to make sure it doesn't get any larger. Pt sates there was black and blue discoloration on his rt breast the first time he got it checked, but hasn't seen any since. Pt states he was lifting thing prior to coming in but he doesn't remember injuring the area. Pt reports some residual tenderness, but not how it was initially. Pt denies tenderness in his breast, injury to his breast.  Denies breast drainage.  DM: Pt checks his blood sugar and his blood sugar this past week has been running 111-196, and his postprandial  blood sugar ran 127-211. Pt noticed some spikes in his blood sugar but suspects it's due to the foods he ate. Pt has been having loose stool, HAs, episodic blurry vision, few episodes of dry mouth, and one episode of nausea when he woke up. Pt now wakes up 1 time at night to urinate and in the past he would wake up 2-3 times a night. Pt is compliant with Metformin and glipizide. Pt suspects his HAs is due to his neck pain. Pt had a nutritional DM class and has another class in October. Pt denies diarrhea, CP, dizziness,  and emesis.  Pt deferred the flu shot.  Review of Systems  Constitutional: Negative for activity change, appetite change, chills, diaphoresis, fatigue and fever.  Eyes: Positive for visual disturbance.  Respiratory: Negative for cough and shortness of breath.   Cardiovascular: Negative for chest pain, palpitations and leg swelling.  Gastrointestinal: Positive for nausea. Negative for abdominal pain, diarrhea and vomiting.  Endocrine: Negative for cold intolerance, heat intolerance, polydipsia, polyphagia and polyuria.  Skin: Negative for color change, rash and wound.  Neurological: Positive for headaches. Negative for dizziness, tremors, seizures, syncope, facial asymmetry, speech difficulty, weakness, light-headedness and numbness.  Psychiatric/Behavioral: Negative for dysphoric mood and sleep disturbance. The patient is not nervous/anxious.    Past Medical History:  Diagnosis Date  . Asthma   . DDD (degenerative disc disease), lumbar   . Depression   . Diabetes mellitus without complication ([place])   . Essential hypertension, benign   . Glucose intolerance (impaired glucose tolerance)   . Leg swelling    Left  . Low back pain   . OSA on CPAP    non compliant   No past surgical history on file. No Known Allergies Current Outpatient Prescriptions  Medication Sig Dispense Refill  . cyclobenzaprine (FLEXERIL) 10 MG tablet Take 1 tablet (10 mg total) by mouth 3 (three) times daily as needed. For spasms 90 tablet 5  . glipiZIDE (GLUCOTROL XL) 5 MG 24  hr tablet Take 1 tablet (5 mg total) by mouth 2 (two) times daily. 180 tablet 0  . glucose blood (GE100 BLOOD GLUCOSE TEST) test strip Use as instructed 100 each 12  . HYDROcodone-acetaminophen (NORCO/VICODIN) 5-325 MG tablet Take 1-2 tablets by mouth every 6 (six) hours as needed for moderate pain. 60 tablet 0  . ibuprofen (ADVIL,MOTRIN) 200 MG tablet Take 200 mg by mouth daily as needed. For headache    . Lancets (ONETOUCH ULTRASOFT)  lancets Check sugar twice daily 100 each 12  . meloxicam (MOBIC) 15 MG tablet Take 1 tablet (15 mg total) by mouth daily. 30 tablet 5  . metFORMIN (GLUCOPHAGE) 500 MG tablet Take 2 tablets (1,000 mg total) by mouth 2 (two) times daily with a meal. 360 tablet 1   No current facility-administered medications for this visit.    Social History   Social History  . Marital status: Married    Spouse name: N/A  . Number of children: N/A  . Years of education: N/A   Occupational History  . 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 Smoking status: Never Smoker  . Smokeless tobacco: Never Used  . Alcohol use Yes     Comment: few times per year  . Drug use: No  . Sexual activity: Not on file   Other Topics Concern  . Not on file   Social History Narrative   Goes to gym.   Family History  Problem Relation Age of Onset  . Diabetes Mother   . Asthma Mother   . Heart disease Father   . Heart disease Brother        Objective:    BP 132/80 (BP Location: Right Arm, Patient Position: Sitting, Cuff Size: Large)   Pulse 93   Temp 98 F (36.7 C) (Oral)   Resp 17   Ht 5' 9.5" (1.765 m)   Wt (!) 372 lb (168.7 kg)   SpO2 99%   BMI 54.15 kg/m  Physical Exam  Constitutional: He is oriented to person, place, and time. He appears well-developed and well-nourished. No distress.  Morbidly obese.  HENT:  Head: Normocephalic and atraumatic.  Right Ear: External ear normal.  Left Ear: External ear normal.  Nose: Nose normal.  Mouth/Throat: Oropharynx is clear and moist.  Eyes: Conjunctivae and EOM are normal. Pupils are equal, round, and reactive to light.  Neck: Normal range of motion. Neck supple. Carotid bruit is not present. No thyromegaly present.  Cardiovascular: Normal rate, regular rhythm, normal heart sounds and intact distal pulses.  Exam reveals no gallop and no friction rub.   No murmur heard. Pulmonary/Chest: Effort normal and breath sounds normal. He has no  wheezes. He has no rales.  2m diameter nodule in the areola region slightly TTP without erythema  Abdominal: Soft. Bowel sounds are normal. He exhibits no distension and no mass. There is no tenderness. There is no rebound and no guarding.  Lymphadenopathy:    He has no cervical adenopathy.  Neurological: He is alert and oriented to person, place, and time. No cranial nerve deficit.  Skin: Skin is warm and dry. No rash noted. He is not diaphoretic.  Psychiatric: He has a normal mood and affect. His behavior is normal.  Nursing note and vitals reviewed.  Results for orders placed or performed in visit on 02/08/16  POCT glucose (manual entry)  Result Value Ref Range   POC Glucose 142 (A) 70 - 99 mg/dl  POCT urine pregnancy  Result Value Ref Range   Preg Test, Ur Negative Negative    Assessment & Plan:   1. Type 2 diabetes mellitus without complication, without long-term current use of insulin ([place])   2. Gynecomastia    -improved sugars with Metformin and Glipizide; increase Metformin to tid for one month and then increase to qid afterwards. Call if fasting sugars < 70.   -continue with diabetic education and nutritional counseling. -advise to schedule diabetic eye exam. -pt refused influenza vaccine, Hepatitis B series, and Pneumovax. -s/p mammogram that reveals gynecomastia.  Obtain appropriate labs to rule out secondary causes.   Orders Placed This Encounter  Procedures  . CBC with Differential/Platelet  . Comprehensive metabolic panel  . TSH  . Testosterone  . Luteinizing hormone  . Estrogens, total  . POCT glucose (manual entry)  . POCT urine pregnancy   Meds ordered this encounter  Medications  . metFORMIN (GLUCOPHAGE) 500 MG tablet    Sig: Take 2 tablets (1,000 mg total) by mouth 2 (two) times daily with a meal.    Dispense:  360 tablet    Refill:  1    Return in about 3 months (around 05/09/2016) for recheck diabetes.  I personally performed the services  described in this documentation, which was scribed in my presence. The recorded information has been reviewed and considered.  Kristi Elayne Guerin, M.D. [facility] [address] [place], [place]  [address] [phone] phone [phone] fax

## 2016-02-12 LAB — ESTROGENS, TOTAL: Estrogen: 108.2 pg/mL (ref 60–190)

## 2016-03-09 ENCOUNTER — Encounter: Payer: Self-pay | Admitting: Dietician

## 2016-03-09 ENCOUNTER — Encounter: Payer: 59 | Attending: Family Medicine | Admitting: Dietician

## 2016-03-09 VITALS — Ht 68.0 in | Wt 375.3 lb

## 2016-03-09 DIAGNOSIS — Z713 Dietary counseling and surveillance: Secondary | ICD-10-CM | POA: Diagnosis present

## 2016-03-09 DIAGNOSIS — Z6841 Body Mass Index (BMI) 40.0 and over, adult: Secondary | ICD-10-CM | POA: Diagnosis not present

## 2016-03-09 DIAGNOSIS — E119 Type 2 diabetes mellitus without complications: Secondary | ICD-10-CM | POA: Diagnosis not present

## 2016-03-09 NOTE — Progress Notes (Signed)

## 2016-03-16 ENCOUNTER — Encounter: Payer: 59 | Admitting: Dietician

## 2016-03-16 ENCOUNTER — Encounter: Payer: Self-pay | Admitting: Dietician

## 2016-03-16 VITALS — Wt 381.6 lb

## 2016-03-16 DIAGNOSIS — E119 Type 2 diabetes mellitus without complications: Secondary | ICD-10-CM

## 2016-03-16 DIAGNOSIS — Z713 Dietary counseling and surveillance: Secondary | ICD-10-CM | POA: Diagnosis not present

## 2016-03-16 NOTE — Progress Notes (Signed)

## 2016-03-23 ENCOUNTER — Encounter: Payer: 59 | Attending: Family Medicine | Admitting: Dietician

## 2016-03-23 VITALS — BP 146/90 | Ht 68.0 in | Wt 379.8 lb

## 2016-03-23 DIAGNOSIS — Z6841 Body Mass Index (BMI) 40.0 and over, adult: Secondary | ICD-10-CM | POA: Insufficient documentation

## 2016-03-23 DIAGNOSIS — E119 Type 2 diabetes mellitus without complications: Secondary | ICD-10-CM

## 2016-03-23 DIAGNOSIS — Z713 Dietary counseling and surveillance: Secondary | ICD-10-CM | POA: Insufficient documentation

## 2016-03-23 NOTE — Progress Notes (Signed)

## 2016-03-31 ENCOUNTER — Encounter: Payer: Self-pay | Admitting: *Deleted

## 2016-04-21 ENCOUNTER — Encounter: Payer: Self-pay | Admitting: Family Medicine

## 2016-04-21 ENCOUNTER — Ambulatory Visit (INDEPENDENT_AMBULATORY_CARE_PROVIDER_SITE_OTHER): Payer: BLUE CROSS/BLUE SHIELD | Admitting: Family Medicine

## 2016-04-21 VITALS — BP 144/84 | HR 88 | Temp 98.1°F | Ht 68.0 in | Wt 375.2 lb

## 2016-04-21 DIAGNOSIS — N62 Hypertrophy of breast: Secondary | ICD-10-CM | POA: Diagnosis not present

## 2016-04-21 DIAGNOSIS — M5136 Other intervertebral disc degeneration, lumbar region: Secondary | ICD-10-CM

## 2016-04-21 DIAGNOSIS — I10 Essential (primary) hypertension: Secondary | ICD-10-CM | POA: Diagnosis not present

## 2016-04-21 DIAGNOSIS — M4810 Ankylosing hyperostosis [Forestier], site unspecified: Secondary | ICD-10-CM

## 2016-04-21 DIAGNOSIS — E119 Type 2 diabetes mellitus without complications: Secondary | ICD-10-CM

## 2016-04-21 LAB — POCT GLYCOSYLATED HEMOGLOBIN (HGB A1C): Hemoglobin A1C: 7.4

## 2016-04-21 MED ORDER — MELOXICAM 15 MG PO TABS: 15.0000 mg | ORAL_TABLET | Freq: Every day | ORAL | 5 refills | Status: DC

## 2016-04-21 MED ORDER — ENALAPRIL MALEATE 10 MG PO TABS: 10.0000 mg | ORAL_TABLET | Freq: Every day | ORAL | 1 refills | Status: DC

## 2016-04-21 MED ORDER — HYDROCODONE-ACETAMINOPHEN 5-325 MG PO TABS: 1.0000 | ORAL_TABLET | Freq: Four times a day (QID) | ORAL | 0 refills | Status: DC | PRN

## 2016-04-21 NOTE — Patient Instructions (Signed)
     IF you received an x-ray today, you will receive an invoice from [facility]. Please contact [facility] at [phone] with questions or concerns regarding your invoice.   IF you received labwork today, you will receive an invoice from [facility]. Please contact [facility] at [phone] with questions or concerns regarding your invoice.   Our billing staff will not be able to assist you with questions regarding bills from these companies.  You will be contacted with the lab results as soon as they are available. The fastest way to get your results is to activate your My Chart account. Instructions are located on the last page of this paperwork. If you have not heard from us regarding the results in 2 weeks, please contact this office.      

## 2016-04-21 NOTE — Progress Notes (Signed)
Subjective:    Patient ID: Brett Marshall, male    DOB: [birth year], 50 y.o.   MRN: [id number]  04/21/2016  Follow-up (63mth f/u diabetes)   HPI This 50 y.o. male presents for three month follow-up of diabetes, hypertension, DDD lumbar spine. Fasting sugar 100 up to 160s.  A couple of spikes morning or night.  Mostly 120-140.  Not taking Metformin middle of day; still only taking Metformin 500mg  one every morning; taking Glipizide 5mg  every morning.  Totally forgetting evening meds with busy schedule.  Usually gets evening dose 50% of time in November.  Did a lot of outside activities with music.     [facility] in [place] one month ago; started with coughing, aching; flu like symptoms.  Low grade temperature to 99.8.  Nasal congestion present; two days later eyes watering.  Bronchitis; prescribed nothing. Blood pressure really elevated, prescribed Enalapril.  Discussed with wife at her visit.  Almost completed dose of Enalapril.  HTN: blood pressure at home running 140/80-90.  One month duration. No side effects to Enalapril.  Bronchitis resolved.  Still has chest congestion with intermittent coughing.  BP Readings from Last 3 Encounters:  04/21/16 (!) 144/84  03/23/16 (!) 146/90  02/08/16 132/80   Wt Readings from Last 3 Encounters:  04/21/16 (!) 375 lb 3.2 oz (170.2 kg)  03/23/16 (!) 379 lb 12.8 oz (172.3 kg)  03/16/16 (!) 381 lb 9.6 oz (173.1 kg)   Immunization History  Administered Date(s) Administered  . Tdap 01/20/2011    Review of Systems  Constitutional: Negative for activity change, appetite change, chills, diaphoresis, fatigue and fever.  Eyes: Negative for visual disturbance.  Respiratory: Negative for cough and shortness of breath.   Cardiovascular: Negative for chest pain, palpitations and leg swelling.  Endocrine: Negative for cold intolerance, heat intolerance, polydipsia, polyphagia and polyuria.  Musculoskeletal: Positive for back pain.  Neurological: Negative  for dizziness, tremors, seizures, syncope, facial asymmetry, speech difficulty, weakness, light-headedness, numbness and headaches.    Past Medical History:  Diagnosis Date  . Asthma   . DDD (degenerative disc disease), lumbar   . Depression   . Diabetes mellitus without complication ([place])   . Essential hypertension, benign   . Leg swelling    Left  . Low back pain   . OSA on CPAP    non compliant   History reviewed. No pertinent surgical history. No Known Allergies Current Outpatient Prescriptions  Medication Sig Dispense Refill  . cyclobenzaprine (FLEXERIL) 10 MG tablet Take 1 tablet (10 mg total) by mouth 3 (three) times daily as needed. For spasms 90 tablet 5  . enalapril (VASOTEC) 10 MG tablet Take 1 tablet (10 mg total) by mouth daily. 90 tablet 1  . glipiZIDE (GLUCOTROL XL) 5 MG 24 hr tablet Take 1 tablet (5 mg total) by mouth 2 (two) times daily. 180 tablet 0  . glucose blood (GE100 BLOOD GLUCOSE TEST) test strip Use as instructed 100 each 12  . HYDROcodone-acetaminophen (NORCO/VICODIN) 5-325 MG tablet Take 1-2 tablets by mouth every 6 (six) hours as needed for moderate pain. 60 tablet 0  . ibuprofen (ADVIL,MOTRIN) 200 MG tablet Take 200 mg by mouth daily as needed. For headache    . Lancets (ONETOUCH ULTRASOFT) lancets Check sugar twice daily 100 each 12  . meloxicam (MOBIC) 15 MG tablet Take 1 tablet (15 mg total) by mouth daily. 30 tablet 5  . metFORMIN (GLUCOPHAGE) 500 MG tablet Take 2 tablets (1,000 mg total) by mouth 2 (two)  times daily with a meal. (Patient taking differently: Take 500 mg by mouth 3 (three) times daily with meals. ) 360 tablet 1   No current facility-administered medications for this visit.    Social History   Social History  . Marital status: Married    Spouse name: N/A  . Number of children: N/A  . Years of education: N/A   Occupational History  . 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 Smoking status: Never Smoker  .  Smokeless tobacco: Never Used  . Alcohol use Yes     Comment: few times per year  . Drug use: No  . Sexual activity: Not on file   Other Topics Concern  . Not on file   Social History Narrative   Goes to gym.   Family History  Problem Relation Age of Onset  . Diabetes Mother   . Asthma Mother   . Heart disease Father   . Heart disease Brother        Objective:    BP (!) 144/84 (BP Location: Right Arm, Patient Position: Sitting, Cuff Size: Large)   Pulse 88   Temp 98.1 F (36.7 C) (Oral)   Ht 5\' 8"  (1.727 m)   Wt (!) 375 lb 3.2 oz (170.2 kg)   SpO2 100%   BMI 57.05 kg/m  Physical Exam  Constitutional: He is oriented to person, place, and time. He appears well-developed and well-nourished. No distress.  HENT:  Head: Normocephalic and atraumatic.  Right Ear: External ear normal.  Left Ear: External ear normal.  Nose: Nose normal.  Mouth/Throat: Oropharynx is clear and moist.  Eyes: Conjunctivae and EOM are normal. Pupils are equal, round, and reactive to light.  Neck: Normal range of motion. Neck supple. Carotid bruit is not present. No thyromegaly present.  Cardiovascular: Normal rate, regular rhythm, normal heart sounds and intact distal pulses.  Exam reveals no gallop and no friction rub.   No murmur heard. Pulmonary/Chest: Effort normal and breath sounds normal. He has no wheezes. He has no rales.  Abdominal: Soft. Bowel sounds are normal. He exhibits no distension and no mass. There is no tenderness. There is no rebound and no guarding.  Lymphadenopathy:    He has no cervical adenopathy.  Neurological: He is alert and oriented to person, place, and time. No cranial nerve deficit.  Skin: Skin is warm and dry. No rash noted. He is not diaphoretic.  Psychiatric: He has a normal mood and affect. His behavior is normal.  Nursing note and vitals reviewed.  Results for orders placed or performed in visit on 04/21/16  CBC with Differential/Platelet  Result Value Ref  Range   WBC 9.6 3.4 - 10.8 x10E3/uL   RBC 4.85 4.14 - 5.80 x10E6/uL   Hemoglobin 13.6 13.0 - 17.7 g/dL   Hematocrit 41.0 37.5 - 51.0 %   MCV 85 79 - 97 fL   MCH 28.0 26.6 - 33.0 pg   MCHC 33.2 31.5 - 35.7 g/dL   RDW 14.6 12.3 - 15.4 %   Platelets 239 150 - 379 x10E3/uL   Neutrophils 68 Not Estab. %   Lymphs 23 Not Estab. %   Monocytes 6 Not Estab. %   Eos 2 Not Estab. %   Basos 1 Not Estab. %   Neutrophils Absolute 6.5 1.4 - 7.0 x10E3/uL   Lymphocytes Absolute 2.2 0.7 - 3.1 x10E3/uL   Monocytes Absolute 0.6 0.1 - 0.9 x10E3/uL   EOS (ABSOLUTE) 0.2 0.0 -  0.4 x10E3/uL   Basophils Absolute 0.1 0.0 - 0.2 x10E3/uL   Immature Granulocytes 0 Not Estab. %   Immature Grans (Abs) 0.0 0.0 - 0.1 x10E3/uL  Comprehensive metabolic panel  Result Value Ref Range   Glucose 209 (H) 65 - 99 mg/dL   BUN 11 6 - 24 mg/dL   Creatinine, Ser 0.73 (L) 0.76 - 1.27 mg/dL   GFR calc non Af Amer 108 >59 mL/min/1.73   GFR calc Af Amer 125 >59 mL/min/1.73   BUN/Creatinine Ratio 15 9 - 20   Sodium 142 134 - 144 mmol/L   Potassium 5.0 3.5 - 5.2 mmol/L   Chloride 103 96 - 106 mmol/L   CO2 24 18 - 29 mmol/L   Calcium 9.4 8.7 - 10.2 mg/dL   Total Protein 7.4 6.0 - 8.5 g/dL   Albumin 4.2 3.5 - 5.5 g/dL   Globulin, Total 3.2 1.5 - 4.5 g/dL   Albumin/Globulin Ratio 1.3 1.2 - 2.2   Bilirubin Total <0.2 0.0 - 1.2 mg/dL   Alkaline Phosphatase 99 39 - 117 IU/L   AST 17 0 - 40 IU/L   ALT 21 0 - 44 IU/L  POCT glycosylated hemoglobin (Hb A1C)  Result Value Ref Range   Hemoglobin A1C 7.4        Assessment & Plan:   1. Type 2 diabetes mellitus without complication, without long-term current use of insulin ([place])   2. Essential hypertension   3. Gynecomastia   4. Morbid obesity due to excess calories ([place])   5. DISH (diffuse idiopathic skeletal hyperostosis)   6. DDD (degenerative disc disease), lumbar    -increase Metformin and stop Glipizide. -rx for Enalapril 10mg  daily provided; obtain labs. -refill of  Mobic and hydrocodone provided for DDD lumbar spine.   Orders Placed This Encounter  Procedures  . CBC with Differential/Platelet  . Comprehensive metabolic panel  . Microalbumin, urine  . POCT urinalysis dipstick  . POCT glycosylated hemoglobin (Hb A1C)   Meds ordered this encounter  Medications  . meloxicam (MOBIC) 15 MG tablet    Sig: Take 1 tablet (15 mg total) by mouth daily.    Dispense:  30 tablet    Refill:  5  . HYDROcodone-acetaminophen (NORCO/VICODIN) 5-325 MG tablet    Sig: Take 1-2 tablets by mouth every 6 (six) hours as needed for moderate pain.    Dispense:  60 tablet    Refill:  0  . enalapril (VASOTEC) 10 MG tablet    Sig: Take 1 tablet (10 mg total) by mouth daily.    Dispense:  90 tablet    Refill:  1    No Follow-up on file.   Akshay Spang Elayne Guerin, M.D. [facility] [address] [place], [place]  [address] [phone] phone [phone] fax

## 2016-04-22 LAB — CBC WITH DIFFERENTIAL/PLATELET
BASOS: 1 %
Basophils Absolute: 0.1 10*3/uL (ref 0.0–0.2)
EOS (ABSOLUTE): 0.2 10*3/uL (ref 0.0–0.4)
EOS: 2 %
HEMATOCRIT: 41 % (ref 37.5–51.0)
Hemoglobin: 13.6 g/dL (ref 13.0–17.7)
IMMATURE GRANS (ABS): 0 10*3/uL (ref 0.0–0.1)
IMMATURE GRANULOCYTES: 0 %
LYMPHS: 23 %
Lymphocytes Absolute: 2.2 10*3/uL (ref 0.7–3.1)
MCH: 28 pg (ref 26.6–33.0)
MCHC: 33.2 g/dL (ref 31.5–35.7)
MCV: 85 fL (ref 79–97)
Monocytes Absolute: 0.6 10*3/uL (ref 0.1–0.9)
Monocytes: 6 %
NEUTROS PCT: 68 %
Neutrophils Absolute: 6.5 10*3/uL (ref 1.4–7.0)
PLATELETS: 239 10*3/uL (ref 150–379)
RBC: 4.85 x10E6/uL (ref 4.14–5.80)
RDW: 14.6 % (ref 12.3–15.4)
WBC: 9.6 10*3/uL (ref 3.4–10.8)

## 2016-04-22 LAB — COMPREHENSIVE METABOLIC PANEL
A/G RATIO: 1.3 (ref 1.2–2.2)
ALT: 21 IU/L (ref 0–44)
AST: 17 IU/L (ref 0–40)
Albumin: 4.2 g/dL (ref 3.5–5.5)
Alkaline Phosphatase: 99 IU/L (ref 39–117)
BUN/Creatinine Ratio: 15 (ref 9–20)
BUN: 11 mg/dL (ref 6–24)
Bilirubin Total: <0.2 mg/dL (ref 0.0–1.2)
CALCIUM: 9.4 mg/dL (ref 8.7–10.2)
CO2: 24 mmol/L (ref 18–29)
CREATININE: 0.73 mg/dL — AB (ref 0.76–1.27)
Chloride: 103 mmol/L (ref 96–106)
GFR, EST AFRICAN AMERICAN: 125 mL/min/{1.73_m2} (ref >59)
GFR, EST NON AFRICAN AMERICAN: 108 mL/min/{1.73_m2} (ref >59)
GLOBULIN, TOTAL: 3.2 g/dL (ref 1.5–4.5)
Glucose: 209 mg/dL — ABNORMAL HIGH (ref 65–99)
POTASSIUM: 5 mmol/L (ref 3.5–5.2)
Sodium: 142 mmol/L (ref 134–144)
TOTAL PROTEIN: 7.4 g/dL (ref 6.0–8.5)

## 2016-04-28 ENCOUNTER — Ambulatory Visit: Payer: 59 | Admitting: Family Medicine

## 2016-05-17 ENCOUNTER — Encounter: Payer: Self-pay | Admitting: Family Medicine

## 2016-05-17 DIAGNOSIS — M5136 Other intervertebral disc degeneration, lumbar region: Secondary | ICD-10-CM | POA: Insufficient documentation

## 2016-05-17 DIAGNOSIS — M4810 Ankylosing hyperostosis [Forestier], site unspecified: Secondary | ICD-10-CM | POA: Insufficient documentation

## 2016-06-12 ENCOUNTER — Encounter: Payer: Self-pay | Admitting: Family Medicine

## 2016-06-14 MED ORDER — GLIPIZIDE ER 5 MG PO TB24: 5.0000 mg | ORAL_TABLET | Freq: Two times a day (BID) | ORAL | 0 refills | Status: DC

## 2016-06-25 ENCOUNTER — Telehealth: Payer: Self-pay

## 2016-06-25 MED ORDER — GLIPIZIDE ER 5 MG PO TB24: 5.0000 mg | ORAL_TABLET | Freq: Two times a day (BID) | ORAL | 0 refills | Status: DC

## 2016-06-25 NOTE — Telephone Encounter (Signed)
Patient needs his glipiZIDE (GLUCOTROL XL) 5 MG 24 hr tablet states he is out of them completely. He uses the [facility] in [place].

## 2016-06-25 NOTE — Telephone Encounter (Signed)
04/21/17 last ov and labs

## 2016-07-14 ENCOUNTER — Encounter: Payer: Self-pay | Admitting: Physician Assistant

## 2016-07-14 ENCOUNTER — Ambulatory Visit (INDEPENDENT_AMBULATORY_CARE_PROVIDER_SITE_OTHER): Payer: BLUE CROSS/BLUE SHIELD

## 2016-07-14 ENCOUNTER — Ambulatory Visit (INDEPENDENT_AMBULATORY_CARE_PROVIDER_SITE_OTHER): Payer: BLUE CROSS/BLUE SHIELD | Admitting: Physician Assistant

## 2016-07-14 VITALS — BP 130/94 | HR 90 | Temp 98.1°F | Resp 16 | Ht 68.0 in | Wt 377.2 lb

## 2016-07-14 DIAGNOSIS — M542 Cervicalgia: Secondary | ICD-10-CM

## 2016-07-14 DIAGNOSIS — R202 Paresthesia of skin: Secondary | ICD-10-CM

## 2016-07-14 MED ORDER — TRAMADOL HCL 50 MG PO TABS: 50.0000 mg | ORAL_TABLET | Freq: Three times a day (TID) | ORAL | 0 refills | Status: DC | PRN

## 2016-07-14 NOTE — Patient Instructions (Addendum)
I will contact you regarding the results of the xray and we will go from there. Please ice this shoulder three times per day for 15 minutes.       IF you received an x-ray today, you will receive an invoice from [facility]. Please contact [facility] at [phone] with questions or concerns regarding your invoice.   IF you received labwork today, you will receive an invoice from [facility]. Please contact [facility] at [phone] with questions or concerns regarding your invoice.   Our billing staff will not be able to assist you with questions regarding bills from these companies.  You will be contacted with the lab results as soon as they are available. The fastest way to get your results is to activate your My Chart account. Instructions are located on the last page of this paperwork. If you have not heard from [place] regarding the results in 2 weeks, please contact this office.

## 2016-07-15 NOTE — Progress Notes (Signed)
[facility] [address] [place] [address] [phone]  Date:  07/14/2016   Name:  Brett Marshall   DOB:  10-Aug-1965   MRN:  [id number]  PCP:  [name], MD    History of Present Illness:  Brett Marshall with PMH of DDD is a 51 y.o. male patient who presents to [facility]   --reports 2 days of left shoulder pain.  It is in his neck and down left side to mid back.  There is numbness and tingling of the left extremity down to his fingers.  No swelling that he noticed.  No erythema. --no chest pains, sob, diaphoresis, dizziness. --reviewed MR cervical 2016 with disc herniation, and foraminal narrowing.   --rates pain 10/10 once it gets started.    Patient Active Problem List   Diagnosis Date Noted  . DISH (diffuse idiopathic skeletal hyperostosis) 05/17/2016  . DDD (degenerative disc disease), lumbar 05/17/2016  . Type 2 diabetes mellitus without complication, without long-term current use of insulin ([place]) 02/08/2016  . Gynecomastia 02/08/2016  . Morbid obesity due to excess calories ([place]) 02/08/2016    Past Medical History:  Diagnosis Date  . Asthma   . DDD (degenerative disc disease), lumbar   . Depression   . Diabetes mellitus without complication ([place])   . Essential hypertension, benign   . Leg swelling    Left  . Low back pain   . OSA on CPAP    non compliant    No past surgical history on file.  Social History  Substance Use Topics  . Smoking status: Never Smoker  . Smokeless tobacco: Never Used  . Alcohol use Yes     Comment: few times per year    Family History  Problem Relation Age of Onset  . Diabetes Mother   . Asthma Mother   . Heart disease Father   . Heart disease Brother     No Known Allergies  Medication list has been reviewed and updated.  Current Outpatient Prescriptions on File Prior to Visit  Medication Sig Dispense Refill  . cyclobenzaprine (FLEXERIL) 10 MG tablet Take 1 tablet (10 mg total) by mouth 3 (three)  times daily as needed. For spasms 90 tablet 5  . enalapril (VASOTEC) 10 MG tablet Take 1 tablet (10 mg total) by mouth daily. 90 tablet 1  . glipiZIDE (GLUCOTROL XL) 5 MG 24 hr tablet Take 1 tablet (5 mg total) by mouth 2 (two) times daily. 180 tablet 0  . glucose blood (GE100 BLOOD GLUCOSE TEST) test strip Use as instructed 100 each 12  . ibuprofen (ADVIL,MOTRIN) 200 MG tablet Take 200 mg by mouth daily as needed. For headache    . Lancets (ONETOUCH ULTRASOFT) lancets Check sugar twice daily 100 each 12  . meloxicam (MOBIC) 15 MG tablet Take 1 tablet (15 mg total) by mouth daily. 30 tablet 5  . metFORMIN (GLUCOPHAGE) 500 MG tablet Take 2 tablets (1,000 mg total) by mouth 2 (two) times daily with a meal. (Patient taking differently: Take 500 mg by mouth 3 (three) times daily with meals. ) 360 tablet 1  . HYDROcodone-acetaminophen (NORCO/VICODIN) 5-325 MG tablet Take 1-2 tablets by mouth every 6 (six) hours as needed for moderate pain. (Patient not taking: Reported on 07/14/2016) 60 tablet 0   No current facility-administered medications on file prior to visit.     ROS ROS otherwise unremarkable unless listed above.   Physical Examination: BP (!) 130/94 (BP Location: Right Arm,  Cuff Size: Large)   Pulse 90   Temp 98.1 F (36.7 C) (Oral)   Resp 16   Ht 5\' 8"  (1.727 m)   Wt (!) 377 lb 3.2 oz (171.1 kg)   SpO2 99%   BMI 57.35 kg/m  Ideal Body Weight: Weight in (lb) to have BMI = 25: 164.1  Physical Exam  Constitutional: He is oriented to person, place, and time. He appears well-developed and well-nourished. No distress.  HENT:  Head: Normocephalic and atraumatic.  Eyes: Conjunctivae and EOM are normal. Pupils are equal, round, and reactive to light.  Cardiovascular: Normal rate.   Pulmonary/Chest: Effort normal. No respiratory distress.  Musculoskeletal:  Cervical spinous tenderness around the c4-c7 area.  No erythema or swelling.  Tender along the left side of posterior thoracic just  inferior to the scapula. Normal rom of UE, with pain incited with external rotation.  Normal strength throughout.  Negative NEER or hawkins.    Negative empty can test.   Neurological: He is alert and oriented to person, place, and time.  Skin: Skin is warm and dry. He is not diaphoretic.  Psychiatric: He has a normal mood and affect. His behavior is normal.   Dg Cervical Spine Complete  Result Date: 07/14/2016 CLINICAL DATA:  Tenderness in the cervical spine. EXAM: CERVICAL SPINE - COMPLETE 4+ VIEW COMPARISON:  Cervical MRI from 05/21/2014 FINDINGS: Straightening of the normal cervical lordosis. No subluxation. Anterior interbody spurring at C5-6 and C6-7. Intervertebral disc height is preserved. Difficult to assess the prevertebral soft tissues although there is expected transition in soft tissue thickness at the C4-5 level. There is a suggestion of uncinate and facet spurring which could be contributing to foraminal narrowing at the C4-5, C5-6, and C6-7 levels. The patient's maxillary incisors obscure the odontoid and lateral masses of C 1 on the open mouth odontoid view attempts. There is some bandlike scarring at the right lung apex projecting over the right first rib, there was some faint sclerosis in this vicinity on the chest radiograph of 05/08/2013 with a relatively similar pattern and accordingly this is most likely chronic/incidental. IMPRESSION: 1. Uncinate and facet spurring in the mid to lower cervical spine could contribute to foraminal narrowing. 2. Bandlike density at the right lung apex appears to be chronically stable and accordingly likely benign. 3. Straightening of the normal cervical lordosis which can occur in muscle spasm. 4. Limited value of the open mouth odontoid view, the maxillary incisors project over the odontoid and lateral masses of C1 on this projection. 5. No subluxation or compelling findings of fracture. Electronically Signed   By: Van Clines M.D.   On:  07/14/2016 12:46     Assessment and Plan: Brett Marshall is a 51 y.o. male who is here today for cc of left shoulder pain. --advised to continue the flexeril, mobic, and will add tramadol.  This appears inflamed and may be improved with an injection.  At this time weighing the cost and benefit, a prednisone taper may complicate DM.  Follow up with dr. Lynann Bologna 3/6.   Tingling of left upper extremity - Plan: DG Cervical Spine Complete  Neck pain - Plan: DG Cervical Spine Complete  Ivar Drape, PA-C [facility] 2/28/201811:54 PM

## 2016-07-21 DIAGNOSIS — M5412 Radiculopathy, cervical region: Secondary | ICD-10-CM | POA: Diagnosis not present

## 2016-07-22 ENCOUNTER — Encounter: Payer: Self-pay | Admitting: Family Medicine

## 2016-07-23 ENCOUNTER — Other Ambulatory Visit: Payer: Self-pay | Admitting: Orthopedic Surgery

## 2016-07-23 DIAGNOSIS — M5412 Radiculopathy, cervical region: Secondary | ICD-10-CM

## 2016-08-07 ENCOUNTER — Ambulatory Visit
Admission: RE | Admit: 2016-08-07 | Discharge: 2016-08-07 | Disposition: A | Discharge status: Home / Self Care | Payer: BLUE CROSS/BLUE SHIELD | Source: Ambulatory Visit | Attending: Orthopedic Surgery | Admitting: Orthopedic Surgery

## 2016-08-07 DIAGNOSIS — M5412 Radiculopathy, cervical region: Secondary | ICD-10-CM

## 2016-08-07 DIAGNOSIS — M50221 Other cervical disc displacement at C4-C5 level: Secondary | ICD-10-CM | POA: Diagnosis not present

## 2016-08-10 DIAGNOSIS — M5412 Radiculopathy, cervical region: Secondary | ICD-10-CM | POA: Diagnosis not present

## 2016-08-11 ENCOUNTER — Ambulatory Visit: Payer: Self-pay | Admitting: Family Medicine

## 2016-08-12 ENCOUNTER — Ambulatory Visit: Payer: Self-pay | Admitting: Family Medicine

## 2016-08-12 ENCOUNTER — Encounter: Payer: Self-pay | Admitting: Family Medicine

## 2016-08-12 ENCOUNTER — Ambulatory Visit (INDEPENDENT_AMBULATORY_CARE_PROVIDER_SITE_OTHER): Payer: BLUE CROSS/BLUE SHIELD | Admitting: Family Medicine

## 2016-08-12 VITALS — BP 182/115 | HR 94 | Temp 98.1°F | Resp 18 | Ht 68.0 in | Wt 386.0 lb

## 2016-08-12 DIAGNOSIS — E119 Type 2 diabetes mellitus without complications: Secondary | ICD-10-CM

## 2016-08-12 DIAGNOSIS — M4810 Ankylosing hyperostosis [Forestier], site unspecified: Secondary | ICD-10-CM

## 2016-08-12 DIAGNOSIS — I10 Essential (primary) hypertension: Secondary | ICD-10-CM | POA: Diagnosis not present

## 2016-08-12 DIAGNOSIS — M5136 Other intervertebral disc degeneration, lumbar region: Secondary | ICD-10-CM

## 2016-08-12 LAB — GLUCOSE, POCT (MANUAL RESULT ENTRY): POC GLUCOSE: 186 mg/dL — AB (ref 70–99)

## 2016-08-12 LAB — POCT GLYCOSYLATED HEMOGLOBIN (HGB A1C): Hemoglobin A1C: 10.8

## 2016-08-12 MED ORDER — HYDROCHLOROTHIAZIDE 12.5 MG PO TABS: 12.5000 mg | ORAL_TABLET | Freq: Every day | ORAL | 1 refills | Status: DC

## 2016-08-12 MED ORDER — EMPAGLIFLOZIN 25 MG PO TABS: 25.0000 mg | ORAL_TABLET | Freq: Every day | ORAL | 5 refills | Status: DC

## 2016-08-12 MED ORDER — HYDROCODONE-ACETAMINOPHEN 5-325 MG PO TABS: 1.0000 | ORAL_TABLET | Freq: Four times a day (QID) | ORAL | 0 refills | Status: DC | PRN

## 2016-08-12 MED ORDER — GLIPIZIDE ER 5 MG PO TB24: 5.0000 mg | ORAL_TABLET | Freq: Two times a day (BID) | ORAL | 1 refills | Status: DC

## 2016-08-12 MED ORDER — MELOXICAM 15 MG PO TABS: 15.0000 mg | ORAL_TABLET | Freq: Every day | ORAL | 5 refills | Status: DC

## 2016-08-12 NOTE — Progress Notes (Signed)
Subjective:    Patient ID: Brett Marshall, male    DOB: 06/08/1965, 51 y.o.   MRN: [id number]  08/12/2016  Follow-up (DM)   HPI This 51 y.o. male presents for evaluation of DMII, hypertension. Sugars are high; running 200-250 fasting.  Did take Prednisone for 5 days only.  Sugars were really high then.  Now has tingling in feet today; toes are tingling.  Added Glipizide at night; did not take BP medication; pain also occurring.  Checking BP at home; running a little high but not this high; diastolics 94-50.  Systolic 388-828. Has gained weight ;plans to start nutrisystem again.  Had to stop due to lack of income.   Tolerating Metformin without side effects.  Neck pain: MRI neck; s/p MRI cervical spine; s/p consultation by [name]; saw Dr. Mina Marble on Monday.  Injections scheduled next week. Pain in L neck with radiation into L arms.  Tingling in fingers and in arm.  Very frustrated.     Immunization History  Administered Date(s) Administered  . Tdap 01/20/2011   BP Readings from Last 3 Encounters:  08/24/16 (!) 150/102  08/12/16 (!) 182/115  07/14/16 (!) 130/94   Wt Readings from Last 3 Encounters:  08/24/16 (!) 365 lb (165.6 kg)  08/12/16 (!) 386 lb (175.1 kg)  07/14/16 (!) 377 lb 3.2 oz (171.1 kg)    Review of Systems  Constitutional: Negative for activity change, appetite change, chills, diaphoresis, fatigue and fever.  Respiratory: Negative for cough and shortness of breath.   Cardiovascular: Negative for chest pain, palpitations and leg swelling.  Gastrointestinal: Negative for abdominal pain, diarrhea, nausea and vomiting.  Endocrine: Negative for cold intolerance, heat intolerance, polydipsia, polyphagia and polyuria.  Musculoskeletal: Positive for neck pain and neck stiffness.  Skin: Negative for color change, rash and wound.  Neurological: Negative for dizziness, tremors, seizures, syncope, facial asymmetry, speech difficulty, weakness, light-headedness, numbness and  headaches.  Psychiatric/Behavioral: Negative for dysphoric mood and sleep disturbance. The patient is not nervous/anxious.     Past Medical History:  Diagnosis Date  . Asthma   . DDD (degenerative disc disease), lumbar   . Depression   . Diabetes mellitus without complication ([place])   . Essential hypertension, benign   . Leg swelling    Left  . Low back pain   . OSA on CPAP    non compliant   No past surgical history on file. No Known Allergies  Social History   Social History  . Marital status: Married    Spouse name: N/A  . Number of children: N/A  . Years of education: N/A   Occupational History  . 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 Smoking status: Never Smoker  . Smokeless tobacco: Never Used  . Alcohol use Yes     Comment: few times per year  . Drug use: No  . Sexual activity: Not on file   Other Topics Concern  . Not on file   Social History Narrative   Goes to gym.   Family History  Problem Relation Age of Onset  . Diabetes Mother   . Asthma Mother   . Heart disease Father   . Heart disease Brother        Objective:    BP (!) 182/115   Pulse 94   Temp 98.1 F (36.7 C) (Oral)   Resp 18   Ht 5' 8"  (1.727 m)   Wt (!) 386 lb (175.1 kg)   SpO2  100%   BMI 58.69 kg/m  Physical Exam  Constitutional: He is oriented to person, place, and time. He appears well-developed and well-nourished. No distress.  HENT:  Head: Normocephalic and atraumatic.  Right Ear: External ear normal.  Left Ear: External ear normal.  Nose: Nose normal.  Mouth/Throat: Oropharynx is clear and moist.  Eyes: Conjunctivae and EOM are normal. Pupils are equal, round, and reactive to light.  Neck: Normal range of motion. Neck supple. Carotid bruit is not present. No thyromegaly present.  Cardiovascular: Normal rate, regular rhythm, normal heart sounds and intact distal pulses.  Exam reveals no gallop and no friction rub.   No murmur  heard. Pulmonary/Chest: Effort normal and breath sounds normal. He has no wheezes. He has no rales.  Abdominal: Soft. Bowel sounds are normal. He exhibits no distension and no mass. There is no tenderness. There is no rebound and no guarding.  Lymphadenopathy:    He has no cervical adenopathy.  Neurological: He is alert and oriented to person, place, and time. No cranial nerve deficit.  Skin: Skin is warm and dry. No rash noted. He is not diaphoretic.  Psychiatric: He has a normal mood and affect. His behavior is normal.  Nursing note and vitals reviewed.  Results for orders placed or performed in visit on 08/12/16  CBC with Differential/Platelet  Result Value Ref Range   WBC 9.8 3.4 - 10.8 x10E3/uL   RBC 4.80 4.14 - 5.80 x10E6/uL   Hemoglobin 13.6 13.0 - 17.7 g/dL   Hematocrit 40.2 37.5 - 51.0 %   MCV 84 79 - 97 fL   MCH 28.3 26.6 - 33.0 pg   MCHC 33.8 31.5 - 35.7 g/dL   RDW 14.7 12.3 - 15.4 %   Platelets 250 150 - 379 x10E3/uL   Neutrophils 65 Not Estab. %   Lymphs 27 Not Estab. %   Monocytes 5 Not Estab. %   Eos 3 Not Estab. %   Basos 0 Not Estab. %   Neutrophils Absolute 6.4 1.4 - 7.0 x10E3/uL   Lymphocytes Absolute 2.6 0.7 - 3.1 x10E3/uL   Monocytes Absolute 0.5 0.1 - 0.9 x10E3/uL   EOS (ABSOLUTE) 0.2 0.0 - 0.4 x10E3/uL   Basophils Absolute 0.0 0.0 - 0.2 x10E3/uL   Immature Granulocytes 0 Not Estab. %   Immature Grans (Abs) 0.0 0.0 - 0.1 x10E3/uL  Comprehensive metabolic panel  Result Value Ref Range   Glucose 184 (H) 65 - 99 mg/dL   BUN 9 6 - 24 mg/dL   Creatinine, Ser 0.71 (L) 0.76 - 1.27 mg/dL   GFR calc non Af Amer 109 >59 mL/min/1.73   GFR calc Af Amer 126 >59 mL/min/1.73   BUN/Creatinine Ratio 13 9 - 20   Sodium 139 134 - 144 mmol/L   Potassium 5.1 3.5 - 5.2 mmol/L   Chloride 100 96 - 106 mmol/L   CO2 26 18 - 29 mmol/L   Calcium 9.4 8.7 - 10.2 mg/dL   Total Protein 7.0 6.0 - 8.5 g/dL   Albumin 3.9 3.5 - 5.5 g/dL   Globulin, Total 3.1 1.5 - 4.5 g/dL    Albumin/Globulin Ratio 1.3 1.2 - 2.2   Bilirubin Total 0.3 0.0 - 1.2 mg/dL   Alkaline Phosphatase 102 39 - 117 IU/L   AST 25 0 - 40 IU/L   ALT 36 0 - 44 IU/L  POCT glucose (manual entry)  Result Value Ref Range   POC Glucose 186 (A) 70 - 99 mg/dl  POCT glycosylated hemoglobin (  Hb A1C)  Result Value Ref Range   Hemoglobin A1C 10.8        Assessment & Plan:   1. Type 2 diabetes mellitus without complication, without long-term current use of insulin ([place])   2. DISH (diffuse idiopathic skeletal hyperostosis)   3. DDD (degenerative disc disease), lumbar   4. Morbid obesity due to excess calories ([place])   5. Essential hypertension    -uncontrolled; add Jardiance; increase Glipizide to bid.  Obtain labs. -rx for Mobic and Hydrocodone provided for acute on chronic neck pain; managed by ortho.   -blood pressure uncontrolled due to pain; monitor closely; add HCTZ 12.69m daily.  Obtain labs.   Orders Placed This Encounter  Procedures  . CBC with Differential/Platelet  . Comprehensive metabolic panel  . POCT glucose (manual entry)  . POCT glycosylated hemoglobin (Hb A1C)  . POCT urinalysis dipstick  . HM Diabetes Foot Exam   Meds ordered this encounter  Medications  . empagliflozin (JARDIANCE) 25 MG TABS tablet    Sig: Take 25 mg by mouth daily.    Dispense:  30 tablet    Refill:  5  . glipiZIDE (GLUCOTROL XL) 5 MG 24 hr tablet    Sig: Take 1 tablet (5 mg total) by mouth 2 (two) times daily.    Dispense:  180 tablet    Refill:  1  . meloxicam (MOBIC) 15 MG tablet    Sig: Take 1 tablet (15 mg total) by mouth daily.    Dispense:  30 tablet    Refill:  5  . HYDROcodone-acetaminophen (NORCO/VICODIN) 5-325 MG tablet    Sig: Take 1-2 tablets by mouth every 6 (six) hours as needed for moderate pain.    Dispense:  60 tablet    Refill:  0  . hydrochlorothiazide (HYDRODIURIL) 12.5 MG tablet    Sig: Take 1 tablet (12.5 mg total) by mouth daily.    Dispense:  90 tablet    Refill:  1      Return in about 3 months (around 11/12/2016) for recheck.   Ella Guillotte MElayne Guerin M.D. [facility] [place] [place]  [phone]

## 2016-08-12 NOTE — Patient Instructions (Signed)
   IF you received an x-ray today, you will receive an invoice from [facility]. Please contact [facility] at [phone] with questions or concerns regarding your invoice.   IF you received labwork today, you will receive an invoice from [facility]. Please contact [facility] at [phone] with questions or concerns regarding your invoice.   Our billing staff will not be able to assist you with questions regarding bills from these companies.  You will be contacted with the lab results as soon as they are available. The fastest way to get your results is to activate your My Chart account. Instructions are located on the last page of this paperwork. If you have not heard from us regarding the results in 2 weeks, please contact this office.      Carbohydrate Counting for Diabetes Mellitus, Adult Carbohydrate counting is a method for keeping track of how many carbohydrates you eat. Eating carbohydrates naturally increases the amount of sugar (glucose) in the blood. Counting how many carbohydrates you eat helps keep your blood glucose within normal limits, which helps you manage your diabetes (diabetes mellitus). It is important to know how many carbohydrates you can safely have in each meal. This is different for every person. A diet and nutrition specialist (registered dietitian) can help you make a meal plan and calculate how many carbohydrates you should have at each meal and snack. Carbohydrates are found in the following foods:  Grains, such as breads and cereals.  Dried beans and soy products.  Starchy vegetables, such as potatoes, peas, and corn.  Fruit and fruit juices.  Milk and yogurt.  Sweets and snack foods, such as cake, cookies, candy, chips, and soft drinks. How do I count carbohydrates? There are two ways to count carbohydrates in food. You can use either of the methods or a combination of both. Reading "Nutrition Facts" on packaged food  The  "Nutrition Facts" list is included on the labels of almost all packaged foods and beverages in the [place] It includes:  The serving size.  Information about nutrients in each serving, including the grams (g) of carbohydrate per serving. To use the "Nutrition Facts":  Decide how many servings you will have.  Multiply the number of servings by the number of carbohydrates per serving.  The resulting number is the total amount of carbohydrates that you will be having. Learning standard serving sizes of other foods  When you eat foods containing carbohydrates that are not packaged or do not include "Nutrition Facts" on the label, you need to measure the servings in order to count the amount of carbohydrates:  Measure the foods that you will eat with a food scale or measuring cup, if needed.  Decide how many standard-size servings you will eat.  Multiply the number of servings by 15. Most carbohydrate-rich foods have about 15 g of carbohydrates per serving.  For example, if you eat 8 oz (170 g) of strawberries, you will have eaten 2 servings and 30 g of carbohydrates (2 servings x 15 g = 30 g).  For foods that have more than one food mixed, such as soups and casseroles, you must count the carbohydrates in each food that is included. The following list contains standard serving sizes of common carbohydrate-rich foods. Each of these servings has about 15 g of carbohydrates:   hamburger bun or  English muffin.   oz (15 mL) syrup.   oz (14 g) jelly.  1 slice of bread.  1 six-inch tortilla.  3   oz (85 g) cooked rice or pasta.  4 oz (113 g) cooked dried beans.  4 oz (113 g) starchy vegetable, such as peas, corn, or potatoes.  4 oz (113 g) hot cereal.  4 oz (113 g) mashed potatoes or  of a large baked potato.  4 oz (113 g) canned or frozen fruit.  4 oz (120 mL) fruit juice.  4-6 crackers.  6 chicken nuggets.  6 oz (170 g) unsweetened dry cereal.  6 oz (170 g) plain  fat-free yogurt or yogurt sweetened with artificial sweeteners.  8 oz (240 mL) milk.  8 oz (170 g) fresh fruit or one small piece of fruit.  24 oz (680 g) popped popcorn. Example of carbohydrate counting Sample meal   3 oz (85 g) chicken breast.  6 oz (170 g) brown rice.  4 oz (113 g) corn.  8 oz (240 mL) milk.  8 oz (170 g) strawberries with sugar-free whipped topping. Carbohydrate calculation  1. Identify the foods that contain carbohydrates:  Rice.  Corn.  Milk.  Strawberries. 2. Calculate how many servings you have of each food:  2 servings rice.  1 serving corn.  1 serving milk.  1 serving strawberries. 3. Multiply each number of servings by 15 g:  2 servings rice x 15 g = 30 g.  1 serving corn x 15 g = 15 g.  1 serving milk x 15 g = 15 g.  1 serving strawberries x 15 g = 15 g. 4. Add together all of the amounts to find the total grams of carbohydrates eaten:  30 g + 15 g + 15 g + 15 g = 75 g of carbohydrates total. This information is not intended to replace advice given to you by your health care provider. Make sure you discuss any questions you have with your health care provider. Document Released: 05/04/2005 Document Revised: 11/22/2015 Document Reviewed: 10/16/2015 Elsevier Interactive Patient Education  2017 Elsevier Inc.  

## 2016-08-13 LAB — COMPREHENSIVE METABOLIC PANEL
ALBUMIN: 3.9 g/dL (ref 3.5–5.5)
ALK PHOS: 102 IU/L (ref 39–117)
ALT: 36 IU/L (ref 0–44)
AST: 25 IU/L (ref 0–40)
Albumin/Globulin Ratio: 1.3 (ref 1.2–2.2)
BUN / CREAT RATIO: 13 (ref 9–20)
BUN: 9 mg/dL (ref 6–24)
Bilirubin Total: 0.3 mg/dL (ref 0.0–1.2)
CHLORIDE: 100 mmol/L (ref 96–106)
CO2: 26 mmol/L (ref 18–29)
CREATININE: 0.71 mg/dL — AB (ref 0.76–1.27)
Calcium: 9.4 mg/dL (ref 8.7–10.2)
GFR calc non Af Amer: 109 mL/min/{1.73_m2} (ref >59)
GFR, EST AFRICAN AMERICAN: 126 mL/min/{1.73_m2} (ref >59)
GLUCOSE: 184 mg/dL — AB (ref 65–99)
Globulin, Total: 3.1 g/dL (ref 1.5–4.5)
Potassium: 5.1 mmol/L (ref 3.5–5.2)
Sodium: 139 mmol/L (ref 134–144)
TOTAL PROTEIN: 7 g/dL (ref 6.0–8.5)

## 2016-08-13 LAB — CBC WITH DIFFERENTIAL/PLATELET
BASOS ABS: 0 10*3/uL (ref 0.0–0.2)
Basos: 0 %
EOS (ABSOLUTE): 0.2 10*3/uL (ref 0.0–0.4)
Eos: 3 %
HEMOGLOBIN: 13.6 g/dL (ref 13.0–17.7)
Hematocrit: 40.2 % (ref 37.5–51.0)
IMMATURE GRANS (ABS): 0 10*3/uL (ref 0.0–0.1)
Immature Granulocytes: 0 %
LYMPHS: 27 %
Lymphocytes Absolute: 2.6 10*3/uL (ref 0.7–3.1)
MCH: 28.3 pg (ref 26.6–33.0)
MCHC: 33.8 g/dL (ref 31.5–35.7)
MCV: 84 fL (ref 79–97)
MONOCYTES: 5 %
Monocytes Absolute: 0.5 10*3/uL (ref 0.1–0.9)
NEUTROS PCT: 65 %
Neutrophils Absolute: 6.4 10*3/uL (ref 1.4–7.0)
PLATELETS: 250 10*3/uL (ref 150–379)
RBC: 4.8 x10E6/uL (ref 4.14–5.80)
RDW: 14.7 % (ref 12.3–15.4)
WBC: 9.8 10*3/uL (ref 3.4–10.8)

## 2016-08-20 DIAGNOSIS — M5412 Radiculopathy, cervical region: Secondary | ICD-10-CM | POA: Diagnosis not present

## 2016-08-24 ENCOUNTER — Encounter (HOSPITAL_BASED_OUTPATIENT_CLINIC_OR_DEPARTMENT_OTHER): Payer: Self-pay

## 2016-08-24 ENCOUNTER — Ambulatory Visit (HOSPITAL_BASED_OUTPATIENT_CLINIC_OR_DEPARTMENT_OTHER)
Admission: RE | Admit: 2016-08-24 | Discharge: 2016-08-24 | Disposition: A | Discharge status: Home / Self Care | Payer: BLUE CROSS/BLUE SHIELD | Source: Ambulatory Visit | Attending: Family Medicine | Admitting: Family Medicine

## 2016-08-24 ENCOUNTER — Ambulatory Visit (HOSPITAL_BASED_OUTPATIENT_CLINIC_OR_DEPARTMENT_OTHER): Admission: RE | Admit: 2016-08-24 | Payer: BLUE CROSS/BLUE SHIELD | Source: Ambulatory Visit

## 2016-08-24 ENCOUNTER — Ambulatory Visit (INDEPENDENT_AMBULATORY_CARE_PROVIDER_SITE_OTHER): Payer: BLUE CROSS/BLUE SHIELD | Admitting: Family Medicine

## 2016-08-24 VITALS — BP 150/102 | HR 104 | Temp 98.6°F | Resp 18 | Ht 68.0 in | Wt 365.0 lb

## 2016-08-24 DIAGNOSIS — R1084 Generalized abdominal pain: Secondary | ICD-10-CM

## 2016-08-24 DIAGNOSIS — K760 Fatty (change of) liver, not elsewhere classified: Secondary | ICD-10-CM | POA: Diagnosis not present

## 2016-08-24 LAB — POC MICROSCOPIC URINALYSIS (UMFC): MUCUS RE: ABSENT

## 2016-08-24 LAB — POCT URINALYSIS DIP (MANUAL ENTRY)
BILIRUBIN UA: NEGATIVE
BILIRUBIN UA: NEGATIVE
Nitrite, UA: NEGATIVE
SPEC GRAV UA: 1.01 (ref 1.030–1.035)
Urobilinogen, UA: 0.2 (ref ?–2.0)
pH, UA: 5 (ref 5.0–8.0)

## 2016-08-24 LAB — POCT CBC
GRANULOCYTE PERCENT: 66.4 % (ref 37–80)
HEMATOCRIT: 45.5 % (ref 43.5–53.7)
Hemoglobin: 15.9 g/dL (ref 14.1–18.1)
LYMPH, POC: 3.9 — AB (ref 0.6–3.4)
MCH, POC: 29.3 pg (ref 27–31.2)
MCHC: 34.8 g/dL (ref 31.8–35.4)
MCV: 84.2 fL (ref 80–97)
MID (CBC): 0.9 (ref 0–0.9)
MPV: 9.1 fL (ref 0–99.8)
POC GRANULOCYTE: 9.5 — AB (ref 2–6.9)
POC LYMPH PERCENT: 27.2 %L (ref 10–50)
POC MID %: 6.4 %M (ref 0–12)
Platelet Count, POC: 266 10*3/uL (ref 142–424)
RBC: 5.41 M/uL (ref 4.69–6.13)
RDW, POC: 14.4 %
WBC: 14.3 10*3/uL — AB (ref 4.6–10.2)

## 2016-08-24 MED ORDER — IOPAMIDOL (ISOVUE-300) INJECTION 61%
100.0000 mL | Freq: Once | INTRAVENOUS | Status: AC | PRN
  Administered 2016-08-24: 100 mL via INTRAVENOUS

## 2016-08-24 NOTE — Assessment & Plan Note (Signed)
There was concern that he may have biliary colic vs pancreatis. CT scan showed severe hepatic steatosis but otherwise normal. Biliary colic could still be the culprit.  - CT abdomen  - stopped Jardiance  - UA, CMP, lipase and amylase  - advised close f/u with Dr. [name]  - may consider RUQ [place] vs liver bx and/or referral to GI

## 2016-08-24 NOTE — Patient Instructions (Signed)
GO TO [facility] [address] NOW

## 2016-08-24 NOTE — Progress Notes (Signed)
  Brett Marshall - 51 y.o. male MRN [id number]  Date of birth: 1966/04/19  SUBJECTIVE:  Including CC & ROS.  Chief Complaint  Patient presents with  . Abdominal Pain    x 1week   Brett Marshall is a 51 yo M that is presenting with abdominal pain. This has been occurring for one week. He denies any changes in his bowels. His last bowel movement was last night. His pain is usually in the RUQ and can occur after eating. Denies any similar pain. He was started on Jardiance about two weeks ago. Denies any pain in his groin but does have some back pain. No dysuria. Denies any prior surgery on his abdomen.   ROS: No unexpected weight loss, fever, chills, swelling, instability, muscle pain, numbness/tingling, redness, otherwise see HPI    HISTORY: Past Medical, Surgical, Social, and Family History Reviewed & Updated per EMR.   Pertinent Historical Findings include: PMSHx -  Dm2 PSHx -  No tobacco use    PHYSICAL EXAM:  VS: BP:(!) 150/102  HR:(!) 104bpm  TEMP:98.6 F (37 C)(Oral)  RESP:96 %  HT:5' 8"  (172.7 cm)   WT:(!) 365 lb (165.6 kg)  BMI:55.6 PHYSICAL EXAM: Gen: NAD, alert, cooperative with exam, morbidly obese  HEENT: NCAT, EOMI, clear conjunctiva,  CV: regular rhythm, good S1/S2, no murmur, no edema, capillary refill brisk  Resp: CTABL, no wheezes, non-labored Abd: SND, BS present, no guarding or organomegaly, generalized tenderness to palpation in all quadrants but more prevalent in LLQ and RLQ  Skin: no rashes, normal turgor  Neuro: no gross deficits.  Psych: alert and oriented   ASSESSMENT & PLAN:   Generalized abdominal pain There was concern that he may have biliary colic vs pancreatis. CT scan showed severe hepatic steatosis but otherwise normal. Biliary colic could still be the culprit.  - CT abdomen  - stopped Jardiance  - UA, CMP, lipase and amylase  - advised close f/u with Dr. [name]  - may consider RUQ [place] vs liver bx and/or referral to GI

## 2016-08-26 NOTE — Progress Notes (Signed)
Patient discussed with Dr. Raeford Razor. Agree with findings, assessment and plan of care per his note.

## 2016-09-13 DIAGNOSIS — I10 Essential (primary) hypertension: Secondary | ICD-10-CM | POA: Insufficient documentation

## 2016-09-22 ENCOUNTER — Other Ambulatory Visit: Payer: Self-pay | Admitting: Family Medicine

## 2016-09-30 ENCOUNTER — Other Ambulatory Visit: Payer: Self-pay | Admitting: Family Medicine

## 2016-09-30 DIAGNOSIS — I10 Essential (primary) hypertension: Secondary | ICD-10-CM

## 2016-10-20 ENCOUNTER — Ambulatory Visit
Admission: EM | Admit: 2016-10-20 | Discharge: 2016-10-20 | Disposition: A | Discharge status: Home / Self Care | Payer: BLUE CROSS/BLUE SHIELD | Attending: Family Medicine | Admitting: Family Medicine

## 2016-10-20 DIAGNOSIS — B9789 Other viral agents as the cause of diseases classified elsewhere: Secondary | ICD-10-CM | POA: Diagnosis not present

## 2016-10-20 DIAGNOSIS — J069 Acute upper respiratory infection, unspecified: Secondary | ICD-10-CM

## 2016-10-20 DIAGNOSIS — R062 Wheezing: Secondary | ICD-10-CM | POA: Diagnosis not present

## 2016-10-20 DIAGNOSIS — R05 Cough: Secondary | ICD-10-CM

## 2016-10-20 LAB — RAPID INFLUENZA A&B ANTIGENS (ARMC ONLY): INFLUENZA A (ARMC): NEGATIVE

## 2016-10-20 LAB — RAPID INFLUENZA A&B ANTIGENS: Influenza B (ARMC): NEGATIVE

## 2016-10-20 MED ORDER — HYDROCOD POLST-CPM POLST ER 10-8 MG/5ML PO SUER: 5.0000 mL | Freq: Two times a day (BID) | ORAL | 0 refills | Status: DC | PRN

## 2016-10-20 MED ORDER — ALBUTEROL SULFATE HFA 108 (90 BASE) MCG/ACT IN AERS: 1.0000 | INHALATION_SPRAY | Freq: Four times a day (QID) | RESPIRATORY_TRACT | 0 refills | Status: DC | PRN

## 2016-10-20 NOTE — ED Triage Notes (Addendum)
Patient complains of cough, body aches, fevers, chills. Patient states that symptoms started on Sunday and have been worsening. Patient reports some shortness of breath-tightness in chest.

## 2016-10-20 NOTE — ED Provider Notes (Signed)
[facility]    CSN: [id number] Arrival date & time: 10/20/16  1700     History   Chief Complaint Chief Complaint  Patient presents with  . Cough    HPI Brett Marshall is a 51 y.o. male.    Cough  Associated symptoms: rhinorrhea and wheezing   Associated symptoms: no headaches   URI  Presenting symptoms: congestion, cough and rhinorrhea   Severity:  Moderate Onset quality:  Sudden Duration:  2 days Timing:  Constant Progression:  Worsening Chronicity:  New Relieved by:  None tried Ineffective treatments:  None tried Associated symptoms: wheezing   Associated symptoms: no headaches, no sinus pain and no sneezing   Risk factors: diabetes mellitus   Risk factors: not elderly, no chronic cardiac disease, no chronic kidney disease, no chronic respiratory disease, no immunosuppression, no recent illness, no recent travel and no sick contacts     Past Medical History:  Diagnosis Date  . Asthma   . DDD (degenerative disc disease), lumbar   . Depression   . Diabetes mellitus without complication ([place])   . Essential hypertension, benign   . Leg swelling    Left  . Low back pain   . OSA on CPAP    non compliant    Patient Active Problem List   Diagnosis Date Noted  . Essential hypertension 09/13/2016  . Generalized abdominal pain 08/24/2016  . DISH (diffuse idiopathic skeletal hyperostosis) 05/17/2016  . DDD (degenerative disc disease), lumbar 05/17/2016  . Type 2 diabetes mellitus without complication, without long-term current use of insulin ([place]) 02/08/2016  . Gynecomastia 02/08/2016  . Morbid obesity due to excess calories ([place]) 02/08/2016    History reviewed. No pertinent surgical history.     Home Medications    Prior to Admission medications   Medication Sig Start Date End Date Taking? Authorizing Provider  cyclobenzaprine (FLEXERIL) 10 MG tablet Take 1 tablet (10 mg total) by mouth 3 (three) times daily as needed. For spasms 01/11/16   Yes [name], MD  enalapril (VASOTEC) 10 MG tablet Take 1 tablet (10 mg total) by mouth daily. 10/01/16  Yes [name], MD  glipiZIDE (GLUCOTROL XL) 5 MG 24 hr tablet Take 1 tablet (5 mg total) by mouth 2 (two) times daily. 08/12/16  Yes [name], MD  glucose blood (GE100 BLOOD GLUCOSE TEST) test strip Use as instructed 02/03/16  Yes [name], MD  hydrochlorothiazide (HYDRODIURIL) 12.5 MG tablet Take 1 tablet (12.5 mg total) by mouth daily. 08/12/16  Yes [name], MD  HYDROcodone-acetaminophen (NORCO/VICODIN) 5-325 MG tablet Take 1-2 tablets by mouth every 6 (six) hours as needed for moderate pain. 08/12/16  Yes [name], MD  ibuprofen (ADVIL,MOTRIN) 200 MG tablet Take 200 mg by mouth daily as needed. For headache   Yes [provider]  Lancets [facility] ULTRASOFT) lancets Check sugar twice daily 01/11/16  Yes [name], MD  meloxicam (MOBIC) 15 MG tablet Take 1 tablet (15 mg total) by mouth daily. 08/12/16  Yes [name], MD  metFORMIN (GLUCOPHAGE) 500 MG tablet Take 2 tablets (1,000 mg total) by mouth 2 (two) times daily with a meal. 09/24/16  Yes Wendie Agreste, MD  albuterol (PROVENTIL HFA;VENTOLIN HFA) 108 (90 Base) MCG/ACT inhaler Inhale 1-2 puffs into the lungs every 6 (six) hours as needed for wheezing or shortness of breath. 10/20/16   Norval Gable, MD  chlorpheniramine-HYDROcodone (TUSSIONEX PENNKINETIC ER) 10-8 MG/5ML SUER Take 5  mLs by mouth every 12 (twelve) hours as needed. 10/20/16   Norval Gable, MD  empagliflozin (JARDIANCE) 25 MG TABS tablet Take 25 mg by mouth daily. 08/12/16   [name], MD  traMADol (ULTRAM) 50 MG tablet Take 1 tablet (50 mg total) by mouth every 8 (eight) hours as needed. Patient not taking: Reported on 08/12/2016 07/14/16   Joretta Bachelor, PA    Family History Family History  Problem Relation Age of Onset  . Diabetes Mother   . Asthma Mother   . Heart disease Father   . Heart disease  Brother     Social History Social History  Substance Use Topics  . Smoking status: Never Smoker  . Smokeless tobacco: Never Used  . Alcohol use Yes     Comment: few times per year     Allergies   Patient has no known allergies.   Review of Systems Review of Systems  HENT: Positive for congestion and rhinorrhea. Negative for sinus pain and sneezing.   Respiratory: Positive for cough and wheezing.   Neurological: Negative for headaches.     Physical Exam Triage Vital Signs ED Triage Vitals  Enc Vitals Group     BP 10/20/16 1824 (!) 160/87     Pulse Rate 10/20/16 1824 94     Resp 10/20/16 1824 17     Temp 10/20/16 1824 98.5 F (36.9 C)     Temp Source 10/20/16 1824 Oral     SpO2 10/20/16 1824 99 %     Weight 10/20/16 1822 (!) 372 lb (168.7 kg)     Height 10/20/16 1822 5\' 8"  (1.727 m)     Head Circumference --      Peak Flow --      Pain Score 10/20/16 1822 9     Pain Loc --      Pain Edu? --      Excl. in [place]? --    No data found.   Updated Vital Signs BP (!) 160/87 (BP Location: Left Arm)   Pulse 94   Temp 98.5 F (36.9 C) (Oral)   Resp 17   Ht 5\' 8"  (1.727 m)   Wt (!) 372 lb (168.7 kg)   SpO2 99%   BMI 56.56 kg/m   Visual Acuity Right Eye Distance:   Left Eye Distance:   Bilateral Distance:    Right Eye Near:   Left Eye Near:    Bilateral Near:     Physical Exam  Constitutional: He appears well-developed and well-nourished. No distress.  HENT:  Head: Normocephalic and atraumatic.  Right Ear: Tympanic membrane, external ear and ear canal normal.  Left Ear: Tympanic membrane, external ear and ear canal normal.  Nose: Nose normal.  Mouth/Throat: Uvula is midline, oropharynx is clear and moist and mucous membranes are normal. No oropharyngeal exudate or tonsillar abscesses.  Eyes: Conjunctivae and EOM are normal. Pupils are equal, round, and reactive to light. Right eye exhibits no discharge. Left eye exhibits no discharge. No scleral icterus.    Neck: Normal range of motion. Neck supple. No tracheal deviation present. No thyromegaly present.  Cardiovascular: Normal rate, regular rhythm and normal heart sounds.   Pulmonary/Chest: Effort normal and breath sounds normal. No stridor. No respiratory distress. He has no wheezes. He has no rales. He exhibits no tenderness.  Lymphadenopathy:    He has no cervical adenopathy.  Neurological: He is alert.  Skin: Skin is warm and dry. No rash noted. He is not diaphoretic.  Nursing note and vitals reviewed.    UC Treatments / Results  Labs (all labs ordered are listed, but only abnormal results are displayed) Labs Reviewed  RAPID INFLUENZA A&B ANTIGENS ([place])    EKG  EKG Interpretation None       Radiology No results found.  Procedures Procedures (including critical care time)  Medications Ordered in UC Medications - No data to display   Initial Impression / Assessment and Plan / UC Course  I have reviewed the triage vital signs and the nursing notes.  Pertinent labs & imaging results that were available during my care of the patient were reviewed by me and considered in my medical decision making (see chart for details).      Final Clinical Impressions(s) / UC Diagnoses   Final diagnoses:  Viral URI with cough  Wheezing    New Prescriptions Discharge Medication List as of 10/20/2016  7:15 PM    START taking these medications   Details  albuterol (PROVENTIL HFA;VENTOLIN HFA) 108 (90 Base) MCG/ACT inhaler Inhale 1-2 puffs into the lungs every 6 (six) hours as needed for wheezing or shortness of breath., Starting Tue 10/20/2016, Normal    chlorpheniramine-HYDROcodone (TUSSIONEX PENNKINETIC ER) 10-8 MG/5ML SUER Take 5 mLs by mouth every 12 (twelve) hours as needed., Starting Tue 10/20/2016, Normal       1. Lab results and diagnosis reviewed with patient 2. rx as per orders above; reviewed possible side effects, interactions, risks and benefits  3. Recommend  supportive treatment with rest, fluids 4. Follow-up prn if symptoms worsen or don't improve   Norval Gable, MD 10/20/16 1940

## 2016-10-27 ENCOUNTER — Other Ambulatory Visit: Payer: Self-pay | Admitting: Family Medicine

## 2016-10-30 ENCOUNTER — Encounter: Payer: Self-pay | Admitting: *Deleted

## 2016-11-10 ENCOUNTER — Encounter: Payer: Self-pay | Admitting: Family Medicine

## 2016-11-10 ENCOUNTER — Ambulatory Visit (INDEPENDENT_AMBULATORY_CARE_PROVIDER_SITE_OTHER): Payer: BLUE CROSS/BLUE SHIELD | Admitting: Family Medicine

## 2016-11-10 VITALS — BP 152/86 | HR 92 | Temp 98.0°F | Resp 16 | Ht 69.29 in | Wt 375.0 lb

## 2016-11-10 DIAGNOSIS — S39011A Strain of muscle, fascia and tendon of abdomen, initial encounter: Secondary | ICD-10-CM

## 2016-11-10 DIAGNOSIS — M4810 Ankylosing hyperostosis [Forestier], site unspecified: Secondary | ICD-10-CM | POA: Diagnosis not present

## 2016-11-10 DIAGNOSIS — M5136 Other intervertebral disc degeneration, lumbar region: Secondary | ICD-10-CM

## 2016-11-10 DIAGNOSIS — I10 Essential (primary) hypertension: Secondary | ICD-10-CM

## 2016-11-10 DIAGNOSIS — E119 Type 2 diabetes mellitus without complications: Secondary | ICD-10-CM | POA: Diagnosis not present

## 2016-11-10 DIAGNOSIS — M51369 Other intervertebral disc degeneration, lumbar region without mention of lumbar back pain or lower extremity pain: Secondary | ICD-10-CM

## 2016-11-10 LAB — GLUCOSE, POCT (MANUAL RESULT ENTRY): POC GLUCOSE: 144 mg/dL — AB (ref 70–99)

## 2016-11-10 LAB — POCT GLYCOSYLATED HEMOGLOBIN (HGB A1C): HEMOGLOBIN A1C: 7.7

## 2016-11-10 MED ORDER — ENALAPRIL MALEATE 20 MG PO TABS: 20.0000 mg | ORAL_TABLET | Freq: Every day | ORAL | 1 refills | Status: DC

## 2016-11-10 MED ORDER — EMPAGLIFLOZIN 25 MG PO TABS: 25.0000 mg | ORAL_TABLET | Freq: Every day | ORAL | 1 refills | Status: DC

## 2016-11-10 MED ORDER — SITAGLIPTIN PHOS-METFORMIN HCL 50-1000 MG PO TABS: 1.0000 | ORAL_TABLET | Freq: Two times a day (BID) | ORAL | 3 refills | Status: DC

## 2016-11-10 NOTE — Patient Instructions (Signed)
     IF you received an x-ray today, you will receive an invoice from [facility]. Please contact [facility] at [phone] with questions or concerns regarding your invoice.   IF you received labwork today, you will receive an invoice from [facility]. Please contact [facility] at [phone] with questions or concerns regarding your invoice.   Our billing staff will not be able to assist you with questions regarding bills from these companies.  You will be contacted with the lab results as soon as they are available. The fastest way to get your results is to activate your My Chart account. Instructions are located on the last page of this paperwork. If you have not heard from us regarding the results in 2 weeks, please contact this office.     

## 2016-11-10 NOTE — Progress Notes (Signed)
Subjective:    Patient ID: Brett Marshall, male    DOB: 06/12/1965, 51 y.o.   MRN: [id number]  11/10/2016  Diabetes; Hypertension; and Abdominal Pain   HPI This 51 y.o. male presents for three month follow-up of DMII and hypertension.   Fasting readings running 106-130. Metformin 500mg  two bid Glipizide ER 5mg  bid. Home readings running 140/80s.   Refusing any form of injection.  Viral infection: missed work earlier in month due to acute illness.  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 Mild nausea; does feel like gas cramps but cannot have a bowel movement to relieve it.  Mild nausea associated with it.  Late afternoon or late night gig.  Not hungry for dinner. No change in food ; occasionally will have a beer.  Drinks a lot of water during the gig; 8-10 glasses of water.  Duration of pain several hours after getting home.  Lifting alot of equipment.  No associated n/v/d/c.  No associated urinary symptoms (dysuria, urgency, hesitancy, hematuria).  S/p cervical spine injection with relief of shoulder pain.   BP Readings from Last 3 Encounters:  11/10/16 (!) 152/86  10/20/16 (!) 160/87  08/24/16 (!) 150/102    Wt Readings from Last 3 Encounters:  11/10/16 (!) 375 lb (170.1 kg)  10/20/16 (!) 372 lb (168.7 kg)  08/24/16 (!) 365 lb (165.6 kg)   Immunization History  Administered Date(s) Administered  . Tdap 01/20/2011    Review of Systems  Constitutional: Negative for activity change, appetite change, chills, diaphoresis, fatigue and fever.  Respiratory: Negative for cough and shortness of breath.   Cardiovascular: Negative for chest pain, palpitations and leg swelling.  Gastrointestinal: Positive for abdominal pain and nausea. Negative for abdominal distention, anal bleeding, blood in stool, constipation, diarrhea, rectal pain and vomiting.  Endocrine: Negative for cold intolerance, heat intolerance, polydipsia, polyphagia and polyuria.    Musculoskeletal: Positive for arthralgias, back pain, neck pain and neck stiffness.  Skin: Negative for color change, rash and wound.  Neurological: Negative for dizziness, tremors, seizures, syncope, facial asymmetry, speech difficulty, weakness, light-headedness, numbness and headaches.  Psychiatric/Behavioral: Negative for dysphoric mood and sleep disturbance. The patient is not nervous/anxious.     Past Medical History:  Diagnosis Date  . Asthma   . DDD (degenerative disc disease), lumbar   . Depression   . Diabetes mellitus without complication ([place])   . Essential hypertension, benign   . Leg swelling    Left  . Low back pain   . OSA on CPAP    non compliant   History reviewed. No pertinent surgical history. No Known Allergies  Social History   Social History  . Marital status: Married    Spouse name: N/A  . Number of children: N/A  . Years of education: N/A   Occupational History  . 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 Smoking status: Never Smoker  . Smokeless tobacco: Never Used  . Alcohol use Yes     Comment: few times per year  . Drug use: No  . Sexual activity: Not on file   Other Topics Concern  . Not on file   Social History Narrative   Goes to gym.   Family History  Problem Relation Age of Onset  . Diabetes Mother   . Asthma Mother   . Heart disease Father   . Heart disease Brother        Objective:    BP (!) 152/86  Pulse 92   Temp 98 F (36.7 C) (Oral)   Resp 16   Ht 5' 9.29" (1.76 m)   Wt (!) 375 lb (170.1 kg)   SpO2 97%   BMI 54.91 kg/m  Physical Exam  Constitutional: He is oriented to person, place, and time. He appears well-developed and well-nourished. No distress.  HENT:  Head: Normocephalic and atraumatic.  Right Ear: External ear normal.  Left Ear: External ear normal.  Nose: Nose normal.  Mouth/Throat: Oropharynx is clear and moist.  Eyes: Conjunctivae and EOM are normal. Pupils are equal,  round, and reactive to light.  Neck: Normal range of motion. Neck supple. Carotid bruit is not present. No thyromegaly present.  Cardiovascular: Normal rate, regular rhythm, normal heart sounds and intact distal pulses.  Exam reveals no gallop and no friction rub.   No murmur heard. Pulmonary/Chest: Effort normal and breath sounds normal. He has no wheezes. He has no rales.  Abdominal: Soft. Bowel sounds are normal. He exhibits no distension and no mass. There is no tenderness. There is no rebound and no guarding.  Lymphadenopathy:    He has no cervical adenopathy.  Neurological: He is alert and oriented to person, place, and time. No cranial nerve deficit.  Skin: Skin is warm and dry. No rash noted. He is not diaphoretic.  Psychiatric: He has a normal mood and affect. His behavior is normal.  Nursing note and vitals reviewed.  Depression screen [facility] 2/9 11/10/2016 08/24/2016 08/12/2016 07/14/2016 04/21/2016  Decreased Interest 0 0 0 0 0  Down, Depressed, Hopeless 0 0 0 0 0  PHQ - 2 Score 0 0 0 0 0        Assessment & Plan:   1. Type 2 diabetes mellitus without complication, without long-term current use of insulin ([place])   2. Essential hypertension   3. DDD (degenerative disc disease), lumbar   4. DISH (diffuse idiopathic skeletal hyperostosis)   5. Morbid obesity due to excess calories ([facility])   6. Strain of abdominal wall, initial encounter    -uncontrolled DMII yet improved; add Januvia 100mg  daily.  Add Jardiance 25mg  daily. Pt refusing trial of Victoza.  -increase Enalapril to 20mg  daily; continue HCTZ 12.5mg  daily. -obtain labs. 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  Observation for now; recommend weight loss. -s/p recent injection for DDD cervical spine.   Orders  Placed This Encounter  Procedures  . CBC with Differential/Platelet  . Comprehensive metabolic panel  . POCT glucose (manual entry)  . POCT glycosylated hemoglobin (Hb A1C)   Meds ordered this encounter  Medications  . sitaGLIPtin-metformin (JANUMET) 50-1000 MG tablet    Sig: Take 1 tablet by mouth 2 (two) times daily with a meal.    Dispense:  180 tablet    Refill:  3  . empagliflozin (JARDIANCE) 25 MG TABS tablet    Sig: Take 25 mg by mouth daily.    Dispense:  90 tablet    Refill:  1  . enalapril (VASOTEC) 20 MG tablet    Sig: Take 1 tablet (20 mg total) by mouth daily.    Dispense:  90 tablet    Refill:  1    Return in about 3 months (around 02/10/2017) for recheck diabetes, high blood pressure.   [place] Elayne Guerin, M.D. Primary Care at [facility]  [place] [facility] [place] [address] [place], [place]  [phone] phone [phone] fax

## 2016-11-11 LAB — COMPREHENSIVE METABOLIC PANEL
ALT: 30 IU/L (ref 0–44)
AST: 22 IU/L (ref 0–40)
Albumin/Globulin Ratio: 1.3 (ref 1.2–2.2)
Albumin: 4.2 g/dL (ref 3.5–5.5)
Alkaline Phosphatase: 93 IU/L (ref 39–117)
BUN/Creatinine Ratio: 19 (ref 9–20)
BUN: 16 mg/dL (ref 6–24)
Bilirubin Total: 0.3 mg/dL (ref 0.0–1.2)
CALCIUM: 9.6 mg/dL (ref 8.7–10.2)
CO2: 26 mmol/L (ref 20–29)
CREATININE: 0.85 mg/dL (ref 0.76–1.27)
Chloride: 101 mmol/L (ref 96–106)
GFR calc Af Amer: 117 mL/min/{1.73_m2} (ref >59)
GFR calc non Af Amer: 101 mL/min/{1.73_m2} (ref >59)
Globulin, Total: 3.3 g/dL (ref 1.5–4.5)
Glucose: 152 mg/dL — ABNORMAL HIGH (ref 65–99)
Potassium: 4.8 mmol/L (ref 3.5–5.2)
Sodium: 140 mmol/L (ref 134–144)
Total Protein: 7.5 g/dL (ref 6.0–8.5)

## 2016-11-11 LAB — CBC WITH DIFFERENTIAL/PLATELET
Basophils Absolute: 0 10*3/uL (ref 0.0–0.2)
Basos: 0 %
EOS (ABSOLUTE): 0.2 10*3/uL (ref 0.0–0.4)
EOS: 2 %
HEMATOCRIT: 40.8 % (ref 37.5–51.0)
Hemoglobin: 13.4 g/dL (ref 13.0–17.7)
IMMATURE GRANULOCYTES: 0 %
Immature Grans (Abs): 0 10*3/uL (ref 0.0–0.1)
LYMPHS: 24 %
Lymphocytes Absolute: 2.4 10*3/uL (ref 0.7–3.1)
MCH: 28 pg (ref 26.6–33.0)
MCHC: 32.8 g/dL (ref 31.5–35.7)
MCV: 85 fL (ref 79–97)
MONOCYTES: 6 %
MONOS ABS: 0.6 10*3/uL (ref 0.1–0.9)
NEUTROS PCT: 68 %
Neutrophils Absolute: 6.7 10*3/uL (ref 1.4–7.0)
PLATELETS: 257 10*3/uL (ref 150–379)
RBC: 4.78 x10E6/uL (ref 4.14–5.80)
RDW: 14.1 % (ref 12.3–15.4)
WBC: 9.9 10*3/uL (ref 3.4–10.8)

## 2016-11-30 ENCOUNTER — Encounter: Payer: Self-pay | Admitting: Family Medicine

## 2016-11-30 ENCOUNTER — Other Ambulatory Visit: Payer: Self-pay | Admitting: Family Medicine

## 2016-11-30 DIAGNOSIS — M5136 Other intervertebral disc degeneration, lumbar region: Secondary | ICD-10-CM

## 2016-12-01 ENCOUNTER — Other Ambulatory Visit: Payer: Self-pay | Admitting: Family Medicine

## 2016-12-01 ENCOUNTER — Encounter: Payer: Self-pay | Admitting: Family Medicine

## 2016-12-01 DIAGNOSIS — G8929 Other chronic pain: Secondary | ICD-10-CM

## 2016-12-01 DIAGNOSIS — M5441 Lumbago with sciatica, right side: Secondary | ICD-10-CM

## 2016-12-01 DIAGNOSIS — M5136 Other intervertebral disc degeneration, lumbar region: Secondary | ICD-10-CM

## 2016-12-01 MED ORDER — HYDROCODONE-ACETAMINOPHEN 5-325 MG PO TABS: 1.0000 | ORAL_TABLET | Freq: Four times a day (QID) | ORAL | 0 refills | Status: DC | PRN

## 2016-12-01 MED ORDER — CYCLOBENZAPRINE HCL 10 MG PO TABS: 10.0000 mg | ORAL_TABLET | Freq: Three times a day (TID) | ORAL | 5 refills | Status: DC | PRN

## 2017-02-10 ENCOUNTER — Ambulatory Visit: Payer: BLUE CROSS/BLUE SHIELD | Admitting: Family Medicine

## 2017-02-15 ENCOUNTER — Encounter: Payer: Self-pay | Admitting: Family Medicine

## 2017-02-15 ENCOUNTER — Ambulatory Visit (INDEPENDENT_AMBULATORY_CARE_PROVIDER_SITE_OTHER): Payer: BLUE CROSS/BLUE SHIELD | Admitting: Family Medicine

## 2017-02-15 VITALS — BP 132/82 | HR 107 | Temp 98.0°F | Resp 16 | Ht 68.9 in | Wt 366.0 lb

## 2017-02-15 DIAGNOSIS — R1084 Generalized abdominal pain: Secondary | ICD-10-CM

## 2017-02-15 DIAGNOSIS — E119 Type 2 diabetes mellitus without complications: Secondary | ICD-10-CM | POA: Diagnosis not present

## 2017-02-15 DIAGNOSIS — M5136 Other intervertebral disc degeneration, lumbar region: Secondary | ICD-10-CM

## 2017-02-15 DIAGNOSIS — I10 Essential (primary) hypertension: Secondary | ICD-10-CM | POA: Diagnosis not present

## 2017-02-15 DIAGNOSIS — M4810 Ankylosing hyperostosis [Forestier], site unspecified: Secondary | ICD-10-CM | POA: Diagnosis not present

## 2017-02-15 LAB — POCT URINALYSIS DIP (MANUAL ENTRY)
BILIRUBIN UA: NEGATIVE
BILIRUBIN UA: NEGATIVE mg/dL
Glucose, UA: 500 mg/dL — AB
Leukocytes, UA: NEGATIVE
Nitrite, UA: NEGATIVE
PROTEIN UA: NEGATIVE mg/dL
RBC UA: NEGATIVE
SPEC GRAV UA: 1.01 (ref 1.010–1.025)
Urobilinogen, UA: 0.2 E.U./dL
pH, UA: 6 (ref 5.0–8.0)

## 2017-02-15 LAB — GLUCOSE, POCT (MANUAL RESULT ENTRY): POC Glucose: 179 mg/dl — AB (ref 70–99)

## 2017-02-15 LAB — POCT GLYCOSYLATED HEMOGLOBIN (HGB A1C): HEMOGLOBIN A1C: 8.1

## 2017-02-15 MED ORDER — HYDROCODONE-ACETAMINOPHEN 5-325 MG PO TABS: 1.0000 | ORAL_TABLET | Freq: Four times a day (QID) | ORAL | 0 refills | Status: DC | PRN

## 2017-02-15 MED ORDER — ENALAPRIL MALEATE 20 MG PO TABS: 20.0000 mg | ORAL_TABLET | Freq: Two times a day (BID) | ORAL | 1 refills | Status: DC

## 2017-02-15 NOTE — Patient Instructions (Addendum)
  LIDODERM PATCHES to lower back.     IF you received an x-ray today, you will receive an invoice from [facility]. Please contact [facility] at [phone] with questions or concerns regarding your invoice.   IF you received labwork today, you will receive an invoice from [facility]. Please contact [facility] at [phone] with questions or concerns regarding your invoice.   Our billing staff will not be able to assist you with questions regarding bills from these companies.  You will be contacted with the lab results as soon as they are available. The fastest way to get your results is to activate your My Chart account. Instructions are located on the last page of this paperwork. If you have not heard from [place] regarding the results in 2 weeks, please contact this office.

## 2017-02-15 NOTE — Progress Notes (Signed)
Subjective:    Patient ID: Brett Marshall, male    DOB: April 05, 1966, 51 y.o.   MRN: [id number]  02/15/2017  Diabetes and Hypertension   HPI This 51 y.o. male presents for evaluation of DMII and hypertension. Patient reports good compliance with medication, good tolerance to medication, and good symptom control.   Sugars are good; fasting sugars 100-148.   Janumet bid; Jardiance qhs. S/p eye exam; now wearing glasses full time.  -uncontrolled DMII yet improved; add Januvia 155m daily.  Add Jardiance 220mdaily. Pt refusing trial of Victoza.  -increase Enalapril to 2073maily; continue HCTZ 12.5mg51mily. -obtain labs. 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  Observation for now; recommend weight loss. -s/p recent injection for DDD cervical spine.   Lower back pain: R lower back pain is worse; requesting hydrocodone refill.   Neck is also hurting right now; last injection 3-4 months ago; earlier this year.  Flexeril two at night.  Meloxicam once nightly.  HTN: Patient reports good compliance with medication, good tolerance to medication, and good symptom control.   130-150/80-95.  Just restarted NutriSystem two months ago.  Happy with weight loss.  Abdominal pain resolved after last visit; playing in band less frequently; work is going well.    BP Readings from Last 3 Encounters:  02/15/17 132/82  11/10/16 (!) 152/86  10/20/16 (!) 160/87   Wt Readings from Last 3 Encounters:  02/15/17 (!) 366 lb (166 kg)  11/10/16 (!) 375 lb (170.1 kg)  10/20/16 (!) 372 lb (168.7 kg)   Immunization History  Administered Date(s) Administered  . Tdap 01/20/2011    Review of Systems  Constitutional: Negative for activity change, appetite change, chills, diaphoresis,  fatigue and fever.  Respiratory: Negative for cough and shortness of breath.   Cardiovascular: Negative for chest pain, palpitations and leg swelling.  Gastrointestinal: Negative for abdominal distention, abdominal pain, anal bleeding, blood in stool, constipation, diarrhea, nausea, rectal pain and vomiting.  Endocrine: Negative for cold intolerance, heat intolerance, polydipsia, polyphagia and polyuria.  Musculoskeletal: Positive for back pain, neck pain and neck stiffness.  Skin: Negative for color change, rash and wound.  Neurological: Negative for dizziness, tremors, seizures, syncope, facial asymmetry, speech difficulty, weakness, light-headedness, numbness and headaches.  Psychiatric/Behavioral: Negative for dysphoric mood and sleep disturbance. The patient is not nervous/anxious.     Past Medical History:  Diagnosis Date  . Asthma   . DDD (degenerative disc disease), lumbar   . Depression   . Diabetes mellitus without complication (HCC)[place]. Essential hypertension, benign   . Leg swelling    Left  . Low back pain   . OSA on CPAP    non compliant   History reviewed. No pertinent surgical history. No Known Allergies  Social History   Social History  . Marital status: Married    Spouse name: N/A  . Number of children: N/A  . Years of education: N/A   Occupational History  . [employer] psychotherapistd   Social History Main Topics  . Smoking status: Never Smoker  . Smokeless tobacco: Never Used  . Alcohol use Yes     Comment: few times per year  . Drug use: No  . Sexual activity: Not on file   Other Topics Concern  . Not on file  Social History Narrative   Goes to gym.   Family History  Problem Relation Age of Onset  . Diabetes Mother   . Asthma Mother   . Heart disease Father   . Heart disease Brother        Objective:    BP 132/82   Pulse (!) 107   Temp 98 F (36.7 C) (Oral)   Resp 16   Ht 5' 8.9" (1.75 m)   Wt (!) 366 lb (166 kg)   SpO2 97%    BMI 54.21 kg/m  Physical Exam  Constitutional: He is oriented to person, place, and time. He appears well-developed and well-nourished. No distress.  HENT:  Head: Normocephalic and atraumatic.  Right Ear: External ear normal.  Left Ear: External ear normal.  Nose: Nose normal.  Mouth/Throat: Oropharynx is clear and moist.  Eyes: Pupils are equal, round, and reactive to light. Conjunctivae and EOM are normal.  Neck: Normal range of motion. Neck supple. Carotid bruit is not present. No thyromegaly present.  Cardiovascular: Normal rate, regular rhythm, normal heart sounds and intact distal pulses.  Exam reveals no gallop and no friction rub.   No murmur heard. 1+ pitting edema B lower extremities to proximal calves.  Pulmonary/Chest: Effort normal and breath sounds normal. He has no wheezes. He has no rales.  Abdominal: Soft. Bowel sounds are normal. He exhibits no distension and no mass. There is no tenderness. There is no rebound and no guarding.  Musculoskeletal: He exhibits edema.  Lymphadenopathy:    He has no cervical adenopathy.  Neurological: He is alert and oriented to person, place, and time. No cranial nerve deficit.  Skin: Skin is warm and dry. No rash noted. He is not diaphoretic.  Psychiatric: He has a normal mood and affect. His behavior is normal.  Nursing note and vitals reviewed.   No results found. Depression screen [facility] 2/9 02/15/2017 11/10/2016 08/24/2016 08/12/2016 07/14/2016  Decreased Interest 0 0 0 0 0  Down, Depressed, Hopeless 0 0 0 0 0  PHQ - 2 Score 0 0 0 0 0   Fall Risk  02/15/2017 11/10/2016 08/24/2016 08/12/2016 07/14/2016  Falls in the past year? No No No No No  Comment - - - - -  Number falls in past yr: - - - - -  Injury with Fall? - - - - -  [place]:   1. Type 2 diabetes mellitus without complication, without long-term current use of insulin ([place])   2. Essential hypertension   3. DDD (degenerative disc disease),  lumbar   4. Morbid obesity due to excess calories ([place])   5. Generalized abdominal pain   6. DISH (diffuse idiopathic skeletal hyperostosis)    -moderate control of DMII and hypertension; continue with Nutrisystem for weight loss; no change to diabetes management; increase Enalapril to 26m twice daily for improved blood pressure control; obtain labs for chronic disease management. -recent worsening of lower back pain and neck pain; continue Meloxicam and Flexeril; refill of hydrocodone provided.  If worsens, follow-up with orthopedics. -abdominal pain has resolved.  -I recommend weight loss, exercise, and low-carbohydrate low-sugar food choices. You should AVOID: regular sodas, sweetened tea, fruit juices.  You should LIMIT: breads, pastas, rice, potatoes, and desserts/sweets.  I would recommend limiting your total carbohydrate intake per meal to 45 grams; I would limit your total carbohydrate intake per snack to 30 grams.  I would also  have a goal of 60 grams of protein intake per day; this would equal 10-15 grams of protein per meal and 5-10 grams of protein per snack. -recommend weight loss, exercise for 30-60 minutes five days per week; recommend 1800 kcal restriction per day with a minimum of 60 grams of protein per day. -recommend trial of OTC Lidoderm patches 4% to back PRN.  Limit to 12 hour duration per day.   Orders Placed This Encounter  Procedures  . CBC with Differential/Platelet  . Comprehensive metabolic panel  . POCT glycosylated hemoglobin (Hb A1C)  . POCT glucose (manual entry)  . POCT urinalysis dipstick   Meds ordered this encounter  Medications  . enalapril (VASOTEC) 20 MG tablet    Sig: Take 1 tablet (20 mg total) by mouth 2 (two) times daily.    Dispense:  180 tablet    Refill:  1  . HYDROcodone-acetaminophen (NORCO/VICODIN) 5-325 MG tablet    Sig: Take 1-2 tablets by mouth every 6 (six) hours as needed for moderate pain.    Dispense:  60 tablet    Refill:  0     Return in about 3 months (around 05/18/2017) for follow-up chronic medical conditions.   Kristi Elayne Guerin, M.D. Primary Care at [facility] previously [facility] [place] [address] [place], [place]  [address] [phone] phone [phone] fax

## 2017-02-16 LAB — CBC WITH DIFFERENTIAL/PLATELET
BASOS ABS: 0.1 10*3/uL (ref 0.0–0.2)
Basos: 1 %
EOS (ABSOLUTE): 0.2 10*3/uL (ref 0.0–0.4)
Eos: 1 %
Hematocrit: 42.5 % (ref 37.5–51.0)
Hemoglobin: 14.5 g/dL (ref 13.0–17.7)
Immature Grans (Abs): 0 10*3/uL (ref 0.0–0.1)
Immature Granulocytes: 0 %
LYMPHS ABS: 2.8 10*3/uL (ref 0.7–3.1)
Lymphs: 24 %
MCH: 28.7 pg (ref 26.6–33.0)
MCHC: 34.1 g/dL (ref 31.5–35.7)
MCV: 84 fL (ref 79–97)
MONOCYTES: 7 %
MONOS ABS: 0.8 10*3/uL (ref 0.1–0.9)
Neutrophils Absolute: 8.1 10*3/uL — ABNORMAL HIGH (ref 1.4–7.0)
Neutrophils: 67 %
PLATELETS: 263 10*3/uL (ref 150–379)
RBC: 5.06 x10E6/uL (ref 4.14–5.80)
RDW: 14.8 % (ref 12.3–15.4)
WBC: 12 10*3/uL — ABNORMAL HIGH (ref 3.4–10.8)

## 2017-02-16 LAB — COMPREHENSIVE METABOLIC PANEL
ALK PHOS: 105 IU/L (ref 39–117)
ALT: 42 IU/L (ref 0–44)
AST: 28 IU/L (ref 0–40)
Albumin/Globulin Ratio: 1.3 (ref 1.2–2.2)
Albumin: 4.5 g/dL (ref 3.5–5.5)
BUN/Creatinine Ratio: 14 (ref 9–20)
BUN: 15 mg/dL (ref 6–24)
Bilirubin Total: 0.2 mg/dL (ref 0.0–1.2)
CHLORIDE: 98 mmol/L (ref 96–106)
CO2: 18 mmol/L — AB (ref 20–29)
CREATININE: 1.1 mg/dL (ref 0.76–1.27)
Calcium: 9.7 mg/dL (ref 8.7–10.2)
GFR calc Af Amer: 89 mL/min/{1.73_m2} (ref >59)
GFR calc non Af Amer: 77 mL/min/{1.73_m2} (ref >59)
GLUCOSE: 182 mg/dL — AB (ref 65–99)
Globulin, Total: 3.5 g/dL (ref 1.5–4.5)
Potassium: 4.6 mmol/L (ref 3.5–5.2)
Sodium: 140 mmol/L (ref 134–144)
Total Protein: 8 g/dL (ref 6.0–8.5)

## 2017-02-28 ENCOUNTER — Other Ambulatory Visit: Payer: Self-pay | Admitting: Family Medicine

## 2017-04-15 ENCOUNTER — Other Ambulatory Visit: Payer: Self-pay | Admitting: Family Medicine

## 2017-04-15 DIAGNOSIS — M5136 Other intervertebral disc degeneration, lumbar region: Secondary | ICD-10-CM

## 2017-04-15 NOTE — Telephone Encounter (Signed)
Medication request; Last pt visit 02/15/17 with Brett Marshall; requesting refill of mobic; all previous refills used; does pt need new presecription called into pharmacy?

## 2017-04-22 ENCOUNTER — Encounter: Payer: Self-pay | Admitting: Family Medicine

## 2017-04-22 DIAGNOSIS — M5136 Other intervertebral disc degeneration, lumbar region: Secondary | ICD-10-CM

## 2017-04-25 MED ORDER — HYDROCODONE-ACETAMINOPHEN 5-325 MG PO TABS: 1.0000 | ORAL_TABLET | Freq: Four times a day (QID) | ORAL | 0 refills | Status: DC | PRN

## 2017-04-25 NOTE — Telephone Encounter (Signed)
Per [place] Controlled Substance Registry, last fill of hydrocodone 02/16/17 by K[facility].  Refill approved.

## 2017-05-24 ENCOUNTER — Ambulatory Visit: Payer: BLUE CROSS/BLUE SHIELD | Admitting: Family Medicine

## 2017-05-24 ENCOUNTER — Encounter: Payer: Self-pay | Admitting: Family Medicine

## 2017-05-24 ENCOUNTER — Other Ambulatory Visit: Payer: Self-pay

## 2017-05-24 VITALS — BP 132/82 | HR 108 | Temp 98.2°F | Resp 16 | Ht 68.9 in | Wt 357.0 lb

## 2017-05-24 DIAGNOSIS — E119 Type 2 diabetes mellitus without complications: Secondary | ICD-10-CM | POA: Diagnosis not present

## 2017-05-24 DIAGNOSIS — I10 Essential (primary) hypertension: Secondary | ICD-10-CM

## 2017-05-24 DIAGNOSIS — M4810 Ankylosing hyperostosis [Forestier], site unspecified: Secondary | ICD-10-CM

## 2017-05-24 DIAGNOSIS — M51369 Other intervertebral disc degeneration, lumbar region without mention of lumbar back pain or lower extremity pain: Secondary | ICD-10-CM

## 2017-05-24 DIAGNOSIS — M5136 Other intervertebral disc degeneration, lumbar region: Secondary | ICD-10-CM

## 2017-05-24 LAB — POCT URINALYSIS DIP (MANUAL ENTRY)
Bilirubin, UA: NEGATIVE
Glucose, UA: 500 mg/dL — AB
Ketones, POC UA: NEGATIVE mg/dL
Nitrite, UA: NEGATIVE
PH UA: 6.5 (ref 5.0–8.0)
PROTEIN UA: NEGATIVE mg/dL
SPEC GRAV UA: 1.015 (ref 1.010–1.025)
UROBILINOGEN UA: 0.2 U/dL

## 2017-05-24 LAB — POCT GLYCOSYLATED HEMOGLOBIN (HGB A1C): HEMOGLOBIN A1C: 8.1

## 2017-05-24 LAB — GLUCOSE, POCT (MANUAL RESULT ENTRY): POC GLUCOSE: 151 mg/dL — AB (ref 70–99)

## 2017-05-24 MED ORDER — DULOXETINE HCL 30 MG PO CPEP: 30.0000 mg | ORAL_CAPSULE | Freq: Every day | ORAL | 1 refills | Status: DC

## 2017-05-24 MED ORDER — MELOXICAM 15 MG PO TABS: 15.0000 mg | ORAL_TABLET | Freq: Every day | ORAL | 1 refills | Status: DC

## 2017-05-24 NOTE — Patient Instructions (Signed)
     IF you received an x-ray today, you will receive an invoice from [facility]. Please contact [facility] at [phone] with questions or concerns regarding your invoice.   IF you received labwork today, you will receive an invoice from [facility]. Please contact [facility] at [phone] with questions or concerns regarding your invoice.   Our billing staff will not be able to assist you with questions regarding bills from these companies.  You will be contacted with the lab results as soon as they are available. The fastest way to get your results is to activate your My Chart account. Instructions are located on the last page of this paperwork. If you have not heard from us regarding the results in 2 weeks, please contact this office.     

## 2017-05-24 NOTE — Progress Notes (Signed)
Subjective:    Patient ID: Brett Marshall, male    DOB: January 25, 1966, 52 y.o.   MRN: [id number]  05/24/2017  Chronic Conditions (3 month follow-up ); Diabetes; Hypertension; and Back Pain    HPI This 52 y.o. male presents for evaluation of DMII, hypertension, hypercholesterolemia, DDD lumbar.  Cutting back.  Sugars not sure; not checking.  Lower back and neck hurts again; likely will need repeat injections; last injection 2016.  Weight has decreased 9 pounds from last visit.  Not participating with Nutrisystem currently.  Limiting portion sizes. Toes really hurt; might hurt with walking; sitting can cause pain; toes and fingers always cold.  Morning or early afternoon.    Management changes made at last visit include: -moderate control of DMII and hypertension; continue with Nutrisystem for weight loss; no change to diabetes management; increase Enalapril to 20mg  twice daily for improved blood pressure control; obtain labs for chronic disease management. -recent worsening of lower back pain and neck pain; continue Meloxicam and Flexeril; refill of hydrocodone provided.  If worsens, follow-up with orthopedics. -abdominal pain has resolved.   BP Readings from Last 3 Encounters:  05/24/17 132/82  02/15/17 132/82  11/10/16 (!) 152/86   Wt Readings from Last 3 Encounters:  05/24/17 (!) 357 lb (161.9 kg)  02/15/17 (!) 366 lb (166 kg)  11/10/16 (!) 375 lb (170.1 kg)   Immunization History  Administered Date(s) Administered  . Tdap 01/20/2011    Review of Systems  Constitutional: Negative for activity change, appetite change, chills, diaphoresis, fatigue and fever.  Respiratory: Negative for cough and shortness of breath.   Cardiovascular: Negative for chest pain, palpitations and leg swelling.  Gastrointestinal: Negative for abdominal pain, diarrhea, nausea and vomiting.  Endocrine: Negative for cold intolerance, heat intolerance, polydipsia, polyphagia and polyuria.    Musculoskeletal: Positive for back pain.  Skin: Negative for color change, rash and wound.  Neurological: Positive for numbness. Negative for dizziness, tremors, seizures, syncope, facial asymmetry, speech difficulty, weakness, light-headedness and headaches.  Psychiatric/Behavioral: Negative for dysphoric mood and sleep disturbance. The patient is not nervous/anxious.     Past Medical History:  Diagnosis Date  . Asthma   . DDD (degenerative disc disease), lumbar   . Depression   . Diabetes mellitus without complication ([place])   . Essential hypertension, benign   . Leg swelling    Left  . Low back pain   . OSA on CPAP    non compliant   History reviewed. No pertinent surgical history. No Known Allergies Current Outpatient Medications on File Prior to Visit  Medication Sig Dispense Refill  . albuterol (PROVENTIL HFA;VENTOLIN HFA) 108 (90 Base) MCG/ACT inhaler Inhale 1-2 puffs into the lungs every 6 (six) hours as needed for wheezing or shortness of breath. 1 Inhaler 0  . cyclobenzaprine (FLEXERIL) 10 MG tablet Take 1 tablet (10 mg total) by mouth 3 (three) times daily as needed. For spasms 90 tablet 5  . empagliflozin (JARDIANCE) 25 MG TABS tablet Take 25 mg by mouth daily. 90 tablet 1  . enalapril (VASOTEC) 20 MG tablet Take 1 tablet (20 mg total) by mouth 2 (two) times daily. 180 tablet 1  . glucose blood (GE100 BLOOD GLUCOSE TEST) test strip Use as instructed 100 each 12  . hydrochlorothiazide (HYDRODIURIL) 12.5 MG tablet Take 1 tablet (12.5 mg total) by mouth daily. 90 tablet 1  . HYDROcodone-acetaminophen (NORCO/VICODIN) 5-325 MG tablet Take 1-2 tablets by mouth every 6 (six) hours as needed for moderate pain. [place]  tablet 0  . ibuprofen (ADVIL,MOTRIN) 200 MG tablet Take 200 mg by mouth daily as needed. For headache    . Lancets (ONETOUCH ULTRASOFT) lancets Check sugar twice daily 100 each 12  . sitaGLIPtin-metformin (JANUMET) 50-1000 MG tablet Take 1 tablet by mouth 2 (two) times  daily with a meal. 180 tablet 3   No current facility-administered medications on file prior to visit.    Social History   Socioeconomic History  . Marital status: Married    Spouse name: Not on file  . Number of children: Not on file  . Years of education: Not on file  . Highest education level: Not on file  Social Needs  . Financial resource strain: Not on file  . Food insecurity - worry: Not on file  . Food insecurity - inability: Not on file  . Transportation needs - medical: Not on file  . Transportation needs - non-medical: Not on file  Occupational History  . Occupation: Chemical engineer: [employer]  Tobacco Use  . Smoking status: Never Smoker  . Smokeless tobacco: Never Used  Substance and Sexual Activity  . Alcohol use: Yes    Comment: few times per year  . Drug use: No  . Sexual activity: Not on file  Other Topics Concern  . Not on file  Social History Narrative   Goes to gym.   Family History  Problem Relation Age of Onset  . Diabetes Mother   . Asthma Mother   . Heart disease Father   . Heart disease Brother        Objective:    BP 132/82   Pulse (!) 108   Temp 98.2 F (36.8 C) (Oral)   Resp 16   Ht 5' 8.9" (1.75 m)   Wt (!) 357 lb (161.9 kg)   SpO2 98%   BMI 52.88 kg/m  Physical Exam  Constitutional: He is oriented to person, place, and time. He appears well-developed and well-nourished. No distress.  HENT:  Head: Normocephalic and atraumatic.  Right Ear: External ear normal.  Left Ear: External ear normal.  Nose: Nose normal.  Mouth/Throat: Oropharynx is clear and moist.  Eyes: Conjunctivae and EOM are normal. Pupils are equal, round, and reactive to light.  Neck: Normal range of motion. Neck supple. Carotid bruit is not present. No thyromegaly present.  Cardiovascular: Normal rate, regular rhythm, normal heart sounds and intact distal pulses. Exam reveals no gallop and no friction rub.  No murmur heard. Pulmonary/Chest: Effort  normal and breath sounds normal. He has no wheezes. He has no rales.  Abdominal: Soft. Bowel sounds are normal. He exhibits no distension and no mass. There is no tenderness. There is no rebound and no guarding.  Lymphadenopathy:    He has no cervical adenopathy.  Neurological: He is alert and oriented to person, place, and time. No cranial nerve deficit.  Skin: Skin is warm and dry. No rash noted. He is not diaphoretic.  Psychiatric: He has a normal mood and affect. His behavior is normal.  Nursing note and vitals reviewed.  No results found. Depression screen [facility] 2/9 05/24/2017 02/15/2017 11/10/2016 08/24/2016 08/12/2016  Decreased Interest 0 0 0 0 0  Down, Depressed, Hopeless 0 0 0 0 0  PHQ - 2 Score 0 0 0 0 0   Fall Risk  05/24/2017 02/15/2017 11/10/2016 08/24/2016 08/12/2016  Falls in the past year? No No No No No  Comment - - - - -  Number falls in  past yr: - - - - -  Injury with Fall? - - - - -  [place]:   1. Type 2 diabetes mellitus without complication, without long-term current use of insulin ([place])   2. Essential hypertension   3. DDD (degenerative disc disease), lumbar   4. DISH (diffuse idiopathic skeletal hyperostosis)   5. Morbid obesity ([place])    Diabetes mellitus uncontrolled with a hemoglobin A1c unchanged at 8.1 despite weight loss.  Note that the holidays are behind this, I encourage patient to focus on weight loss, exercise, low carbohydrate low sugar food choices.  If remains elevated at next visit, will need to add an additional diabetic agent.  Continue Jardiance and Janumet at current doses.  We may need to restart glipizide therapy.  Hypertension with improved control with  medication adjustments at last visit.  Obtain labs for chronic disease management.  No changes to therapy.  Patient suffering with new onset toe pain which is consistent with diabetic peripheral neuropathy.  Patient also suffering with chronic lower back pain and  neck pain.  Initiate Cymbalta therapy for chronic pain syndromes.  Degenerative disc disease of the lumbar spine has been worsening recently.  Refill of meloxicam prescribed.  Recommend follow-up with orthopedist for repeat injections or discussion of next steps of treatment therapy.  Orders Placed This Encounter  Procedures  . CBC with Differential/Platelet  . Comprehensive metabolic panel  . Microalbumin / creatinine urine ratio  . POCT glycosylated hemoglobin (Hb A1C)  . POCT glucose (manual entry)  . POCT urinalysis dipstick   Meds ordered this encounter  Medications  . DULoxetine (CYMBALTA) 30 MG capsule    Sig: Take 1 capsule (30 mg total) by mouth daily.    Dispense:  90 capsule    Refill:  1  . meloxicam (MOBIC) 15 MG tablet    Sig: Take 1 tablet (15 mg total) by mouth daily.    Dispense:  90 tablet    Refill:  1    Return in about 3 months (around 08/22/2017) for follow-up chronic medical conditions.   Mccoy Testa Elayne Guerin, M.D. Primary Care at [facility] previously [facility] [place] [address] [place], [place]  [address] [phone] phone [phone] fax

## 2017-05-25 LAB — COMPREHENSIVE METABOLIC PANEL
ALBUMIN: 4.4 g/dL (ref 3.5–5.5)
ALT: 47 IU/L — ABNORMAL HIGH (ref 0–44)
AST: 32 IU/L (ref 0–40)
Albumin/Globulin Ratio: 1.3 (ref 1.2–2.2)
Alkaline Phosphatase: 99 IU/L (ref 39–117)
BUN/Creatinine Ratio: 17 (ref 9–20)
BUN: 20 mg/dL (ref 6–24)
Bilirubin Total: 0.2 mg/dL (ref 0.0–1.2)
CALCIUM: 9.6 mg/dL (ref 8.7–10.2)
CO2: 20 mmol/L (ref 20–29)
CREATININE: 1.2 mg/dL (ref 0.76–1.27)
Chloride: 101 mmol/L (ref 96–106)
GFR calc Af Amer: 80 mL/min/{1.73_m2} (ref >59)
GFR calc non Af Amer: 70 mL/min/{1.73_m2} (ref >59)
GLUCOSE: 157 mg/dL — AB (ref 65–99)
Globulin, Total: 3.5 g/dL (ref 1.5–4.5)
Potassium: 5 mmol/L (ref 3.5–5.2)
Sodium: 143 mmol/L (ref 134–144)
Total Protein: 7.9 g/dL (ref 6.0–8.5)

## 2017-05-25 LAB — CBC WITH DIFFERENTIAL/PLATELET
BASOS ABS: 0.1 10*3/uL (ref 0.0–0.2)
Basos: 1 %
EOS (ABSOLUTE): 0.2 10*3/uL (ref 0.0–0.4)
Eos: 2 %
HEMOGLOBIN: 14.4 g/dL (ref 13.0–17.7)
Hematocrit: 42.3 % (ref 37.5–51.0)
IMMATURE GRANULOCYTES: 0 %
Immature Grans (Abs): 0 10*3/uL (ref 0.0–0.1)
LYMPHS ABS: 2.5 10*3/uL (ref 0.7–3.1)
Lymphs: 24 %
MCH: 29.1 pg (ref 26.6–33.0)
MCHC: 34 g/dL (ref 31.5–35.7)
MCV: 86 fL (ref 79–97)
Monocytes Absolute: 0.6 10*3/uL (ref 0.1–0.9)
Monocytes: 6 %
NEUTROS PCT: 67 %
Neutrophils Absolute: 6.9 10*3/uL (ref 1.4–7.0)
Platelets: 236 10*3/uL (ref 150–379)
RBC: 4.94 x10E6/uL (ref 4.14–5.80)
RDW: 14.4 % (ref 12.3–15.4)
WBC: 10.2 10*3/uL (ref 3.4–10.8)

## 2017-05-25 LAB — MICROALBUMIN / CREATININE URINE RATIO
Creatinine, Urine: 69.8 mg/dL
MICROALBUM., U, RANDOM: 3.8 ug/mL
Microalb/Creat Ratio: 5.4 mg/g creat (ref 0.0–30.0)

## 2017-06-13 NOTE — Telephone Encounter (Signed)
This encounter was created in error - please disregard.

## 2017-07-15 ENCOUNTER — Other Ambulatory Visit: Payer: Self-pay | Admitting: Family Medicine

## 2017-07-15 DIAGNOSIS — I10 Essential (primary) hypertension: Secondary | ICD-10-CM

## 2017-07-21 ENCOUNTER — Encounter: Payer: Self-pay | Admitting: Family Medicine

## 2017-07-21 DIAGNOSIS — M51369 Other intervertebral disc degeneration, lumbar region without mention of lumbar back pain or lower extremity pain: Secondary | ICD-10-CM

## 2017-07-21 DIAGNOSIS — M5136 Other intervertebral disc degeneration, lumbar region: Secondary | ICD-10-CM

## 2017-08-01 MED ORDER — HYDROCODONE-ACETAMINOPHEN 5-325 MG PO TABS: 1.0000 | ORAL_TABLET | Freq: Four times a day (QID) | ORAL | 0 refills | Status: DC | PRN

## 2017-08-01 MED ORDER — GLIPIZIDE 5 MG PO TABS: 5.0000 mg | ORAL_TABLET | Freq: Two times a day (BID) | ORAL | 1 refills | Status: DC

## 2017-08-01 MED ORDER — METFORMIN HCL 1000 MG PO TABS
1000.0000 mg | ORAL_TABLET | Freq: Two times a day (BID) | ORAL | 1 refills | Status: DC
Start: 2017-08-01 — End: 2017-12-09

## 2017-08-23 ENCOUNTER — Ambulatory Visit: Payer: Self-pay | Admitting: Family Medicine

## 2017-08-23 ENCOUNTER — Other Ambulatory Visit: Payer: Self-pay

## 2017-08-23 ENCOUNTER — Encounter: Payer: Self-pay | Admitting: Family Medicine

## 2017-08-23 VITALS — BP 130/80 | HR 97 | Temp 98.3°F | Ht 68.0 in | Wt 368.6 lb

## 2017-08-23 DIAGNOSIS — M5136 Other intervertebral disc degeneration, lumbar region: Secondary | ICD-10-CM

## 2017-08-23 DIAGNOSIS — R0789 Other chest pain: Secondary | ICD-10-CM

## 2017-08-23 DIAGNOSIS — R945 Abnormal results of liver function studies: Secondary | ICD-10-CM

## 2017-08-23 DIAGNOSIS — M4810 Ankylosing hyperostosis [Forestier], site unspecified: Secondary | ICD-10-CM

## 2017-08-23 DIAGNOSIS — E119 Type 2 diabetes mellitus without complications: Secondary | ICD-10-CM

## 2017-08-23 DIAGNOSIS — K7581 Nonalcoholic steatohepatitis (NASH): Secondary | ICD-10-CM

## 2017-08-23 DIAGNOSIS — R0602 Shortness of breath: Secondary | ICD-10-CM

## 2017-08-23 DIAGNOSIS — R7989 Other specified abnormal findings of blood chemistry: Secondary | ICD-10-CM

## 2017-08-23 DIAGNOSIS — I10 Essential (primary) hypertension: Secondary | ICD-10-CM

## 2017-08-23 LAB — GLUCOSE, POCT (MANUAL RESULT ENTRY): POC Glucose: 152 mg/dl — AB (ref 70–99)

## 2017-08-23 LAB — POCT GLYCOSYLATED HEMOGLOBIN (HGB A1C): Hemoglobin A1C: 6.6

## 2017-08-23 MED ORDER — DULOXETINE HCL 30 MG PO CPEP: 30.0000 mg | ORAL_CAPSULE | Freq: Every day | ORAL | 1 refills | Status: DC

## 2017-08-23 NOTE — Progress Notes (Signed)
Subjective:    Patient ID: Brett Marshall, male    DOB: [birth year], 52 y.o.   MRN: [id number]  08/23/2017  Follow-up (Diabetes mangement) and Medication Refill (needing refeill on Duloxetine 30mg )    HPI This 52 y.o. male presents for three month follow-up of DMII, hypertension, morbid obesity, DDD lumbar spine. Taking Glipizide 5mg  every evening with Janumet; not taking Jardiance because of cost.  Will run out of Janumet after this rx; terminated so no insurance.   Cymbalta 30mg  working well for neuropathy.  One hydrocodone and one Meloxicam at bedtime; two flexeril at bedtime for neck and lower back pain.  SOB with chest tightness for the past two months; some nausea with SOB; walking from car to inside.  Walking from car after doing some activities like shopping or playing music.  Sitting and relaxing makes better.  Not working currently; schedule flexible.   SOB acting like much heavier weight yet now weight is down.   Some diaphoresis and nausea; no radiation; no true pain; chest tightness and heaviness; trying to breathe from upper airway yet no wheezing with it. Really uncomfortable.  Gives sensation that going to pass out.  Never has had syncopal event; no dizziness with chest tightness or SOB.    Neck and lower back pain worse right now.    BP Readings from Last 3 Encounters:  09/17/17 126/86  09/02/17 127/70  08/27/17 (!) 154/101   Wt Readings from Last 3 Encounters:  09/17/17 (!) 371 lb 8 oz (168.5 kg)  09/02/17 (!) 371 lb (168.3 kg)  08/27/17 (!) 371 lb 8 oz (168.5 kg)   Immunization History  Administered Date(s) Administered  . Tdap 01/20/2011    Review of Systems  Constitutional: Negative for activity change, appetite change, chills, diaphoresis, fatigue and fever.  Respiratory: Positive for shortness of breath. Negative for cough, wheezing and stridor.   Cardiovascular: Positive for chest pain. Negative for palpitations and leg swelling.  Gastrointestinal:  Negative for abdominal pain, diarrhea, nausea and vomiting.  Endocrine: Negative for cold intolerance, heat intolerance, polydipsia, polyphagia and polyuria.  Musculoskeletal: Positive for back pain, myalgias, neck pain and neck stiffness.  Skin: Negative for color change, rash and wound.  Neurological: Negative for dizziness, tremors, seizures, syncope, facial asymmetry, speech difficulty, weakness, light-headedness, numbness and headaches.  Psychiatric/Behavioral: Negative for dysphoric mood and sleep disturbance. The patient is not nervous/anxious.     Past Medical History:  Diagnosis Date  . Asthma   . DDD (degenerative disc disease), lumbar   . Depression   . Diabetes mellitus without complication ([place])   . Essential hypertension, benign   . Leg swelling    Left  . Low back pain   . OSA on CPAP    non compliant   Past Surgical History:  Procedure Laterality Date  . CARDIAC CATHETERIZATION     [place] no stents.  [facility] Kitchen RIGHT/LEFT HEART CATH AND CORONARY ANGIOGRAPHY N/A 09/02/2017   Procedure: RIGHT/LEFT HEART CATH AND CORONARY ANGIOGRAPHY;  Surgeon: Wellington Hampshire, MD;  Location: [facility];  Service: Cardiovascular;  Laterality: N/A;   No Known Allergies Current Outpatient Medications on File Prior to Visit  Medication Sig Dispense Refill  . albuterol (PROVENTIL HFA;VENTOLIN HFA) 108 (90 Base) MCG/ACT inhaler Inhale 1-2 puffs into the lungs every 6 (six) hours as needed for wheezing or shortness of breath. 1 Inhaler 0  . cyclobenzaprine (FLEXERIL) 10 MG tablet Take 1 tablet (10 mg total) by mouth 3 (three) times  daily as needed. For spasms 90 tablet 5  . glipiZIDE (GLUCOTROL) 5 MG tablet Take 1 tablet (5 mg total) by mouth 2 (two) times daily before a meal. 180 tablet 1  . glucose blood (GE100 BLOOD GLUCOSE TEST) test strip Use as instructed 100 each 12  . Lancets (ONETOUCH ULTRASOFT) lancets Check sugar twice daily 100 each 12  . metFORMIN (GLUCOPHAGE) 1000 MG  tablet Take 1 tablet (1,000 mg total) by mouth 2 (two) times daily with a meal. 180 tablet 1   No current facility-administered medications on file prior to visit.    Social History   Socioeconomic History  . Marital status: Married    Spouse name: Not on file  . Number of children: Not on file  . Years of education: Not on file  . Highest education level: Not on file  Occupational History  . Occupation: Chemical engineer: [employer]  Social Needs  . Financial resource strain: Not on file  . Food insecurity:    Worry: Not on file    Inability: Not on file  . Transportation needs:    Medical: Not on file    Non-medical: Not on file  Tobacco Use  . Smoking status: Never Smoker  . Smokeless tobacco: Never Used  Substance and Sexual Activity  . Alcohol use: Yes    Comment: few times per year  . Drug use: No  . Sexual activity: Yes  Lifestyle  . Physical activity:    Days per week: Not on file    Minutes per session: Not on file  . Stress: Not on file  Relationships  . Social connections:    Talks on phone: Not on file    Gets together: Not on file    Attends religious service: Not on file    Active member of club or organization: Not on file    Attends meetings of clubs or organizations: Not on file    Relationship status: Not on file  . Intimate partner violence:    Fear of current or ex partner: Not on file    Emotionally abused: Not on file    Physically abused: Not on file    Forced sexual activity: Not on file  Other Topics Concern  . Not on file  Social History Narrative   Goes to gym.   Family History  Problem Relation Age of Onset  . Diabetes Mother   . Asthma Mother   . Heart disease Father   . Heart attack Father   . Sjogren's syndrome Sister   . Heart disease Brother 84       CAD/CABG       Objective:    BP 130/80 (BP Location: Left Arm, Patient Position: Sitting, Cuff Size: Normal)   Pulse 97   Temp 98.3 F (36.8 C) (Oral)   Ht  5\' 8"  (1.727 m)   Wt (!) 368 lb 9.6 oz (167.2 kg)   SpO2 99%   BMI 56.05 kg/m  Physical Exam  Constitutional: He is oriented to person, place, and time. He appears well-developed and well-nourished. No distress.  HENT:  Head: Normocephalic and atraumatic.  Right Ear: External ear normal.  Left Ear: External ear normal.  Nose: Nose normal.  Mouth/Throat: Oropharynx is clear and moist.  Eyes: Pupils are equal, round, and reactive to light. Conjunctivae and EOM are normal.  Neck: Normal range of motion. Neck supple. Carotid bruit is not present. No thyromegaly present.  Cardiovascular: Normal rate, regular  rhythm, normal heart sounds and intact distal pulses. Exam reveals no gallop and no friction rub.  No murmur heard. Pulmonary/Chest: Effort normal and breath sounds normal. No respiratory distress. He has no wheezes. He has no rales. He exhibits no tenderness.  Abdominal: Soft. Bowel sounds are normal. He exhibits no distension and no mass. There is no tenderness. There is no rebound and no guarding.  Musculoskeletal:       Cervical back: He exhibits decreased range of motion, pain and spasm.  Lymphadenopathy:    He has no cervical adenopathy.  Neurological: He is alert and oriented to person, place, and time. No cranial nerve deficit. He exhibits normal muscle tone. Coordination normal.  Skin: Skin is warm and dry. No rash noted. He is not diaphoretic.  Psychiatric: He has a normal mood and affect. His behavior is normal. Judgment and thought content normal.  Nursing note and vitals reviewed.  No results found. Depression screen [facility] 2/9 08/23/2017 05/24/2017 02/15/2017 11/10/2016 08/24/2016  Decreased Interest 0 0 0 0 0  Down, Depressed, Hopeless 0 0 0 0 0  PHQ - 2 Score 0 0 0 0 0   Fall Risk  08/23/2017 05/24/2017 02/15/2017 11/10/2016 08/24/2016  Falls in the past year? No No No No No  Comment - - - - -  Number falls in past yr: - - - - -  Injury with Fall? - - - - -  Comment - - - - -    Results for orders placed or performed in visit on 08/23/17  Comprehensive metabolic panel  Result Value Ref Range   Glucose 151 (H) 65 - 99 mg/dL   BUN 13 6 - 24 mg/dL   Creatinine, Ser 0.93 0.76 - 1.27 mg/dL   GFR calc non Af Amer 94 >59 mL/min/1.73   GFR calc Af Amer 109 >59 mL/min/1.73   BUN/Creatinine Ratio 14 9 - 20   Sodium 142 134 - 144 mmol/L   Potassium 5.0 3.5 - 5.2 mmol/L   Chloride 102 96 - 106 mmol/L   CO2 24 20 - 29 mmol/L   Calcium 9.4 8.7 - 10.2 mg/dL   Total Protein 7.3 6.0 - 8.5 g/dL   Albumin 4.1 3.5 - 5.5 g/dL   Globulin, Total 3.2 1.5 - 4.5 g/dL   Albumin/Globulin Ratio 1.3 1.2 - 2.2   Bilirubin Total 0.2 0.0 - 1.2 mg/dL   Alkaline Phosphatase 96 39 - 117 IU/L   AST 37 0 - 40 IU/L   ALT 43 0 - 44 IU/L  POCT glycosylated hemoglobin (Hb A1C)  Result Value Ref Range   Hemoglobin A1C 6.6   POCT glucose (manual entry)  Result Value Ref Range   POC Glucose 152 (A) 70 - 99 mg/dl        Assessment & Plan:   1. Chest tightness   2. Shortness of breath   3. Type 2 diabetes mellitus without complication, without long-term current use of insulin ([employer])   4. Essential hypertension   5. DDD (degenerative disc disease), lumbar   6. DISH (diffuse idiopathic skeletal hyperostosis)   7. Elevated LFTs   8. NASH (nonalcoholic steatohepatitis)     Chest tightness with shortness of breath: New onset.  Exertional in nature.  Very concerning for cardiac etiology.  Obtain EKG in place urgent referral to cardiology.  Patient agreeable to present to emergency department with recurrence.  Avoid exercise or exertion until cardiology evaluation.  Brain office today.  Diabetes mellitus type 2: Well-controlled  with a hemoglobin A1c of 6.6.  Currently unemployed thus will adjust medications when completes active prescriptions.  Obtain labs for chronic disease management.  Degenerative disc disease of lumbar spine and cervical spines: Moderately controlled with meloxicam,  Flexeril, hydrocodone therapies.  Followed by Dr. Lynann Bologna of ortho with acute worsening pain.  Elevated LFTs: repeat today; likely secondary to NASH.  CT of abdomen and pelvis in April 2018 showed severe hepatic steatosis.  Recommend weight loss and low fat food choices.  Orders Placed This Encounter  Procedures  . Comprehensive metabolic panel    Order Specific Question:   Has the patient fasted?    Answer:   No  . Ambulatory referral to Cardiology    Referral Priority:   Urgent    Referral Type:   Consultation    Referral Reason:   Specialty Services Required    Requested Specialty:   Cardiology    Number of Visits Requested:   1  . POCT glycosylated hemoglobin (Hb A1C)  . POCT glucose (manual entry)  . EKG 12-Lead   Meds ordered this encounter  Medications  . DULoxetine (CYMBALTA) 30 MG capsule    Sig: Take 1 capsule (30 mg total) by mouth daily.    Dispense:  90 capsule    Refill:  1    No follow-ups on file.   Marquasia Schmieder Elayne Guerin, M.D. Primary Care at [facility] [address] [place], [place]  [address] [phone] phone [phone] fax

## 2017-08-23 NOTE — Patient Instructions (Signed)
     IF you received an x-ray today, you will receive an invoice from [facility]. Please contact [facility] at [phone] with questions or concerns regarding your invoice.   IF you received labwork today, you will receive an invoice from [facility]. Please contact [facility] at [phone] with questions or concerns regarding your invoice.   Our billing staff will not be able to assist you with questions regarding bills from these companies.  You will be contacted with the lab results as soon as they are available. The fastest way to get your results is to activate your My Chart account. Instructions are located on the last page of this paperwork. If you have not heard from us regarding the results in 2 weeks, please contact this office.     

## 2017-08-24 LAB — COMPREHENSIVE METABOLIC PANEL
ALBUMIN: 4.1 g/dL (ref 3.5–5.5)
ALT: 43 IU/L (ref 0–44)
AST: 37 IU/L (ref 0–40)
Albumin/Globulin Ratio: 1.3 (ref 1.2–2.2)
Alkaline Phosphatase: 96 IU/L (ref 39–117)
BUN / CREAT RATIO: 14 (ref 9–20)
BUN: 13 mg/dL (ref 6–24)
Bilirubin Total: 0.2 mg/dL (ref 0.0–1.2)
CALCIUM: 9.4 mg/dL (ref 8.7–10.2)
CO2: 24 mmol/L (ref 20–29)
CREATININE: 0.93 mg/dL (ref 0.76–1.27)
Chloride: 102 mmol/L (ref 96–106)
GFR calc non Af Amer: 94 mL/min/{1.73_m2} (ref >59)
GFR, EST AFRICAN AMERICAN: 109 mL/min/{1.73_m2} (ref >59)
GLUCOSE: 151 mg/dL — AB (ref 65–99)
Globulin, Total: 3.2 g/dL (ref 1.5–4.5)
Potassium: 5 mmol/L (ref 3.5–5.2)
Sodium: 142 mmol/L (ref 134–144)
TOTAL PROTEIN: 7.3 g/dL (ref 6.0–8.5)

## 2017-08-27 ENCOUNTER — Encounter: Payer: Self-pay | Admitting: Cardiovascular Disease

## 2017-08-27 ENCOUNTER — Telehealth: Payer: Self-pay | Admitting: Cardiovascular Disease

## 2017-08-27 ENCOUNTER — Ambulatory Visit (INDEPENDENT_AMBULATORY_CARE_PROVIDER_SITE_OTHER): Payer: Self-pay | Admitting: Cardiovascular Disease

## 2017-08-27 ENCOUNTER — Ambulatory Visit
Admission: RE | Admit: 2017-08-27 | Discharge: 2017-08-27 | Disposition: A | Discharge status: Home / Self Care | Payer: Self-pay | Source: Ambulatory Visit | Attending: Cardiovascular Disease | Admitting: Cardiovascular Disease

## 2017-08-27 VITALS — BP 154/101 | HR 93 | Ht 68.0 in | Wt 371.5 lb

## 2017-08-27 DIAGNOSIS — R0989 Other specified symptoms and signs involving the circulatory and respiratory systems: Secondary | ICD-10-CM

## 2017-08-27 DIAGNOSIS — Z01818 Encounter for other preprocedural examination: Secondary | ICD-10-CM

## 2017-08-27 DIAGNOSIS — I10 Essential (primary) hypertension: Secondary | ICD-10-CM

## 2017-08-27 DIAGNOSIS — Z01812 Encounter for preprocedural laboratory examination: Secondary | ICD-10-CM

## 2017-08-27 DIAGNOSIS — E785 Hyperlipidemia, unspecified: Secondary | ICD-10-CM

## 2017-08-27 DIAGNOSIS — I209 Angina pectoris, unspecified: Secondary | ICD-10-CM

## 2017-08-27 NOTE — Progress Notes (Signed)
Cardiology Office Note   Date:  08/27/2017   ID:  Brett Marshall, DOB 1965-07-14, MRN [id number]  PCP:  [name], MD  Cardiologist:   [name], MD   Chief Complaint  Patient presents with  . OTHER    Chest tightness and Sob. Meds reviewed verbally with pt.      History of Present Illness: Brett Marshall is a 52 y.o. male who was referred by Dr. [name] for evaluation of chest pain and shortness of breath.  The patient has  multiple chronic medical conditions that include type 2 diabetes, essential hypertension, morbid obesity and degenerative disc disease. He also has sleep apnea but does not tolerate CPAP very well. He underwent a right and left cardiac catheterization by Dr. Rockey Situ in 2010.Coronary angiography showed no significant coronary artery disease.  Right heart catheterization showed moderately elevated filling pressures with moderate pulmonary hypertension.  Ejection fraction was normal. The patient has known history of morbid obesity with previous peak weight of 430 pounds.  He has managed to get his weight down to around 370 with diet.  Over the last 6 weeks, he has experienced intermittent exertional chest pain.  The pain is substernal with radiation to the left side.  It is described as tightness and heavy feeling that lasts for about 10-15 minutes and is typically triggered by exertion and relieved by rest.  This has been associated with increased shortness of breath and worsening leg edema especially on the left side.  No palpitations, syncope or presyncope.  The patient is not a smoker.  He has strong family history of coronary artery disease.   Past Medical History:  Diagnosis Date  . Asthma   . DDD (degenerative disc disease), lumbar   . Depression   . Diabetes mellitus without complication ([place])   . Essential hypertension, benign   . Leg swelling    Left  . Low back pain   . OSA on CPAP    non compliant    Past Surgical History:  Procedure  Laterality Date  . CARDIAC CATHETERIZATION     [place] no stents.     Current Outpatient Medications  Medication Sig Dispense Refill  . albuterol (PROVENTIL HFA;VENTOLIN HFA) 108 (90 Base) MCG/ACT inhaler Inhale 1-2 puffs into the lungs every 6 (six) hours as needed for wheezing or shortness of breath. 1 Inhaler 0  . cyclobenzaprine (FLEXERIL) 10 MG tablet Take 1 tablet (10 mg total) by mouth 3 (three) times daily as needed. For spasms 90 tablet 5  . DULoxetine (CYMBALTA) 30 MG capsule Take 1 capsule (30 mg total) by mouth daily. 90 capsule 1  . enalapril (VASOTEC) 20 MG tablet Take 1 tablet (20 mg total) by mouth 2 (two) times daily. 60 tablet 0  . glipiZIDE (GLUCOTROL) 5 MG tablet Take 1 tablet (5 mg total) by mouth 2 (two) times daily before a meal. 180 tablet 1  . glucose blood (GE100 BLOOD GLUCOSE TEST) test strip Use as instructed 100 each 12  . hydrochlorothiazide (HYDRODIURIL) 12.5 MG tablet Take 1 tablet (12.5 mg total) by mouth daily. 90 tablet 1  . HYDROcodone-acetaminophen (NORCO/VICODIN) 5-325 MG tablet Take 1-2 tablets by mouth every 6 (six) hours as needed for moderate pain. 60 tablet 0  . JANUMET 50-1000 MG tablet Take 1 tablet by mouth 2 (two) times daily with a meal. 60 tablet 0  . Lancets (ONETOUCH ULTRASOFT) lancets Check sugar twice daily 100 each 12  . meloxicam (MOBIC)  15 MG tablet Take 1 tablet (15 mg total) by mouth daily. 90 tablet 1  . metFORMIN (GLUCOPHAGE) 1000 MG tablet Take 1 tablet (1,000 mg total) by mouth 2 (two) times daily with a meal. (Patient not taking: Reported on 08/27/2017) 180 tablet 1   No current facility-administered medications for this visit.     Allergies:   Patient has no known allergies.    Social History:  The patient  reports that he has never smoked. He has never used smokeless tobacco. He reports that he drinks alcohol. He reports that he does not use drugs.   Family History:  The patient's family history includes Asthma in his  mother; Diabetes in his mother; Heart attack in his father; Heart disease in his father; Heart disease (age of onset: 86) in his brother; Sjogren's syndrome in his sister.    ROS:  Please see the history of present illness.   Otherwise, review of systems are positive for none.   All other systems are reviewed and negative.   PHYSICAL EXAM: VS:  BP (!) 154/101 (BP Location: Right Arm, Patient Position: Sitting, Cuff Size: Large)   Pulse 93   Ht 5' 8"  (1.727 m)   Wt (!) 371 lb 8 oz (168.5 kg)   BMI 56.49 kg/m   , BMI Body mass index is 56.49 kg/m. GEN: Well nourished, well developed, in no acute distress  HEENT: normal  Neck: no JVD or masses.  Bilateral carotid bruits. Cardiac: RRR; norubs, or gallops.  1 out of 6 systolic murmur at the aortic area.  Mild bilateral leg edema. Respiratory:  clear to auscultation bilaterally, normal work of breathing GI: soft, nontender, nondistended, + BS MS: no deformity or atrophy  Skin: warm and dry, no rash Neuro:  Strength and sensation are intact Psych: euthymic mood, full affect   EKG:  EKG is ordered today. The ekg ordered today demonstrates normal sinus rhythm with low voltage and no significant ST or T wave changes.   Recent Labs: 05/24/2017: Hemoglobin 14.4; Platelets 236 08/23/2017: ALT 43; BUN 13; Creatinine, Ser 0.93; Potassium 5.0; Sodium 142    Lipid Panel    Component Value Date/Time   CHOL 163 01/11/2016 1008   TRIG 83 01/11/2016 1008   HDL 47 01/11/2016 1008   CHOLHDL 3.5 01/11/2016 1008   VLDL 17 01/11/2016 1008   LDLCALC 99 01/11/2016 1008      Wt Readings from Last 3 Encounters:  08/27/17 (!) 371 lb 8 oz (168.5 kg)  08/23/17 (!) 368 lb 9.6 oz (167.2 kg)  05/24/17 (!) 357 lb (161.9 kg)     PAD Screen 08/27/2017  Previous PAD dx? No  Previous surgical procedure? No  Pain with walking? Yes  Subsides with rest? No  Feet/toe relief with dangling? Yes  Painful, non-healing ulcers? No  Extremities discolored? Yes       ASSESSMENT AND PLAN:  1.  Exertional dyspnea and chest tightness: Symptoms are suggestive of class III angina.  Previous cardiac catheterization in 2010 showed no significant coronary artery disease.  However, he has multiple risk factors for coronary artery disease.  In addition, previous right heart catheterization showed moderate pulmonary hypertension and evidence of diastolic heart failure. Given his symptoms, he requires further ischemic cardiac evaluation.  I discussed the options with him including stress testing versus angiography.  Due to his morbid obesity, stress testing is going to be limited by artifacts.  I think the best option is to proceed with a right and  left cardiac catheterization for definitive diagnosis.  I discussed the procedure in details as well as risks and benefits.  2.  Bilateral carotid bruits: I am going to obtain carotid Doppler.  3.  Essential hypertension: Blood pressure does not seem to be controlled.  I will consider adding carvedilol after cardiac catheterization.  4.  Borderline hyperlipidemia: Given his diabetic status, we should consider treatment with a statin.   Disposition:   FU with me in 1 month  Signed,  [name], MD  08/27/2017 12:39 PM    [place]

## 2017-08-27 NOTE — H&P (View-Only) (Signed)
Cardiology Office Note   Date:  08/27/2017   ID:  Brett Marshall, DOB March 09, 1966, MRN [id number]  PCP:  Brett Honour, MD  Cardiologist:   [name], MD   Chief Complaint  Patient presents with  . OTHER    Chest tightness and Sob. Meds reviewed verbally with pt.      History of Present Illness: Brett Marshall is a 52 y.o. male who was referred by Dr. Tamala Marshall for evaluation of chest pain and shortness of breath.  The patient has  multiple chronic medical conditions that include type 2 diabetes, essential hypertension, morbid obesity and degenerative disc disease. He also has sleep apnea but does not tolerate CPAP very well. He underwent a right and left cardiac catheterization by Dr. Rockey Marshall in 2010.Coronary angiography showed no significant coronary artery disease.  Right heart catheterization showed moderately elevated filling pressures with moderate pulmonary hypertension.  Ejection fraction was normal. The patient has known history of morbid obesity with previous peak weight of 430 pounds.  He has managed to get his weight down to around 370 with diet.  Over the last 6 weeks, he has experienced intermittent exertional chest pain.  The pain is substernal with radiation to the left side.  It is described as tightness and heavy feeling that lasts for about 10-15 minutes and is typically triggered by exertion and relieved by rest.  This has been associated with increased shortness of breath and worsening leg edema especially on the left side.  No palpitations, syncope or presyncope.  The patient is not a smoker.  He has strong family history of coronary artery disease.   Past Medical History:  Diagnosis Date  . Asthma   . DDD (degenerative disc disease), lumbar   . Depression   . Diabetes mellitus without complication ([place])   . Essential hypertension, benign   . Leg swelling    Left  . Low back pain   . OSA on CPAP    non compliant    Past Surgical History:  Procedure  Laterality Date  . CARDIAC CATHETERIZATION     [place] no stents.     Current Outpatient Medications  Medication Sig Dispense Refill  . albuterol (PROVENTIL HFA;VENTOLIN HFA) 108 (90 Base) MCG/ACT inhaler Inhale 1-2 puffs into the lungs every 6 (six) hours as needed for wheezing or shortness of breath. 1 Inhaler 0  . cyclobenzaprine (FLEXERIL) 10 MG tablet Take 1 tablet (10 mg total) by mouth 3 (three) times daily as needed. For spasms 90 tablet 5  . DULoxetine (CYMBALTA) 30 MG capsule Take 1 capsule (30 mg total) by mouth daily. 90 capsule 1  . enalapril (VASOTEC) 20 MG tablet Take 1 tablet (20 mg total) by mouth 2 (two) times daily. 60 tablet 0  . glipiZIDE (GLUCOTROL) 5 MG tablet Take 1 tablet (5 mg total) by mouth 2 (two) times daily before a meal. 180 tablet 1  . glucose blood (GE100 BLOOD GLUCOSE TEST) test strip Use as instructed 100 each 12  . hydrochlorothiazide (HYDRODIURIL) 12.5 MG tablet Take 1 tablet (12.5 mg total) by mouth daily. 90 tablet 1  . HYDROcodone-acetaminophen (NORCO/VICODIN) 5-325 MG tablet Take 1-2 tablets by mouth every 6 (six) hours as needed for moderate pain. 60 tablet 0  . JANUMET 50-1000 MG tablet Take 1 tablet by mouth 2 (two) times daily with a meal. 60 tablet 0  . Lancets (ONETOUCH ULTRASOFT) lancets Check sugar twice daily 100 each 12  . meloxicam (MOBIC)  15 MG tablet Take 1 tablet (15 mg total) by mouth daily. 90 tablet 1  . metFORMIN (GLUCOPHAGE) 1000 MG tablet Take 1 tablet (1,000 mg total) by mouth 2 (two) times daily with a meal. (Patient not taking: Reported on 08/27/2017) 180 tablet 1   No current facility-administered medications for this visit.     Allergies:   Patient has no known allergies.    Social History:  The patient  reports that he has never smoked. He has never used smokeless tobacco. He reports that he drinks alcohol. He reports that he does not use drugs.   Family History:  The patient's family history includes Asthma in his  mother; Diabetes in his mother; Heart attack in his father; Heart disease in his father; Heart disease (age of onset: 50) in his brother; Sjogren's syndrome in his sister.    ROS:  Please see the history of present illness.   Otherwise, review of systems are positive for none.   All other systems are reviewed and negative.   PHYSICAL EXAM: VS:  BP (!) 154/101 (BP Location: Right Arm, Patient Position: Sitting, Cuff Size: Large)   Pulse 93   Ht 5' 8"  (1.727 m)   Wt (!) 371 lb 8 oz (168.5 kg)   BMI 56.49 kg/m   , BMI Body mass index is 56.49 kg/m. GEN: Well nourished, well developed, in no acute distress  HEENT: normal  Neck: no JVD or masses.  Bilateral carotid bruits. Cardiac: RRR; norubs, or gallops.  1 out of 6 systolic murmur at the aortic area.  Mild bilateral leg edema. Respiratory:  clear to auscultation bilaterally, normal work of breathing GI: soft, nontender, nondistended, + BS MS: no deformity or atrophy  Skin: warm and dry, no rash Neuro:  Strength and sensation are intact Psych: euthymic mood, full affect   EKG:  EKG is ordered today. The ekg ordered today demonstrates normal sinus rhythm with low voltage and no significant ST or T wave changes.   Recent Labs: 05/24/2017: Hemoglobin 14.4; Platelets 236 08/23/2017: ALT 43; BUN 13; Creatinine, Ser 0.93; Potassium 5.0; Sodium 142    Lipid Panel    Component Value Date/Time   CHOL 163 01/11/2016 1008   TRIG 83 01/11/2016 1008   HDL 47 01/11/2016 1008   CHOLHDL 3.5 01/11/2016 1008   VLDL 17 01/11/2016 1008   LDLCALC 99 01/11/2016 1008      Wt Readings from Last 3 Encounters:  08/27/17 (!) 371 lb 8 oz (168.5 kg)  08/23/17 (!) 368 lb 9.6 oz (167.2 kg)  05/24/17 (!) 357 lb (161.9 kg)     PAD Screen 08/27/2017  Previous PAD dx? No  Previous surgical procedure? No  Pain with walking? Yes  Subsides with rest? No  Feet/toe relief with dangling? Yes  Painful, non-healing ulcers? No  Extremities discolored? Yes       ASSESSMENT AND PLAN:  1.  Exertional dyspnea and chest tightness: Symptoms are suggestive of class III angina.  Previous cardiac catheterization in 2010 showed no significant coronary artery disease.  However, he has multiple risk factors for coronary artery disease.  In addition, previous right heart catheterization showed moderate pulmonary hypertension and evidence of diastolic heart failure. Given his symptoms, he requires further ischemic cardiac evaluation.  I discussed the options with him including stress testing versus angiography.  Due to his morbid obesity, stress testing is going to be limited by artifacts.  I think the best option is to proceed with a right and  left cardiac catheterization for definitive diagnosis.  I discussed the procedure in details as well as risks and benefits.  2.  Bilateral carotid bruits: I am going to obtain carotid Doppler.  3.  Essential hypertension: Blood pressure does not seem to be controlled.  I will consider adding carvedilol after cardiac catheterization.  4.  Borderline hyperlipidemia: Given his diabetic status, we should consider treatment with a statin.   Disposition:   FU with me in 1 month  Signed,  [name], MD  08/27/2017 12:39 PM    [place]

## 2017-08-27 NOTE — Telephone Encounter (Signed)
Per Dr. Fletcher Anon, patient needs a carotid ultrasound for bilateral carotid bruits.  Order placed. Left message for patient to contact the office.

## 2017-08-27 NOTE — Patient Instructions (Addendum)
Medication Instructions:  Your physician recommends that you continue on your current medications as directed. Please refer to the Current Medication list given to you today.   Labwork: BMET, CBC  Testing/Procedures: A chest x-ray takes a picture of the organs and structures inside the chest, including the heart, lungs, and blood vessels. This test can show several things, including, whether the heart is enlarges; whether fluid is building up in the lungs; and whether pacemaker / defibrillator leads are still in place.   Your physician has requested that you have a cardiac catheterization. Cardiac catheterization is used to diagnose and/or treat various heart conditions. Doctors may recommend this procedure for a number of different reasons. The most common reason is to evaluate chest pain. Chest pain can be a symptom of coronary artery disease (CAD), and cardiac catheterization can show whether plaque is narrowing or blocking your heart's arteries. This procedure is also used to evaluate the valves, as well as measure the blood flow and oxygen levels in different parts of your heart. For further information please visit [employer]. Please follow instruction sheet, as given.  [facility] Cardiac Cath Instructions   You are scheduled for a Cardiac Cath on: Thursday, April 18  Please arrive at 7:30am on the day of your procedure  Please expect a call from our [facility] to pre-register you  Do not eat/drink anything after midnight  Someone will need to drive you home  It is recommended someone be with you for the first 24 hours after your procedure  Wear clothes that are easy to get on/off and wear slip on shoes if possible   Medications bring a current list of all medications with you  _xx__ You may take all of your medications the morning of your procedure with enough water to swallow safely EXCEPT:   DO NOT TAKE JANUMET ONE BEFORE AND FOR TWO DAYS AFTER THE  PROCEDURE.   Day of your procedure: Arrive at the [facility] entrance.  Free valet service is available.  After entering the [facility] please check-in at the registration desk (1st desk on your right) to receive your armband. After receiving your armband someone will escort you to the cardiac cath/special procedures waiting area.  The usual length of stay after your procedure is about 2 to 3 hours.  This can vary.  If you have any questions, please call our office at [phone], or you may call the cardiac cath lab at [facility] directly at [phone]   Follow-Up: Your physician recommends that you schedule a follow-up appointment in: 1 month with Dr. Fletcher Anon.    Any Other Special Instructions Will Be Listed Below (If Applicable).     If you need a refill on your cardiac medications before your next appointment, please call your pharmacy.   Angiogram An angiogram, also called angiography, is a procedure used to look at the blood vessels. In this procedure, dye is injected through a long, thin tube (catheter) into an artery. X-rays are then taken. The X-rays will show if there is a blockage or problem in a blood vessel. Tell a health care provider about:  Any allergies you have, including allergies to shellfish or contrast dye.  All medicines you are taking, including vitamins, herbs, eye drops, creams, and over-the-counter medicines.  Any problems you or family members have had with anesthetic medicines.  Any blood disorders you have.  Any surgeries you have had.  Any previous kidney problems or failure you have had.  Any medical  conditions you have.  Possibility of pregnancy, if this applies. What are the risks? Generally, an angiogram is a safe procedure. However, as with any procedure, problems can occur. Possible problems include:  Injury to the blood vessels, including rupture or bleeding.  Infection or bruising at the catheter site.  Allergic reaction to the  dye or contrast used.  Kidney damage from the dye or contrast used.  Blood clots that can lead to a stroke or heart attack.  What happens before the procedure?  Do not eat or drink after midnight on the night before the procedure, or as directed by your health care provider.  Ask your health care provider if you may drink enough water to take any needed medicines the morning of the procedure. What happens during the procedure?  You may be given a medicine to help you relax (sedative) before and during the procedure. This medicine is given through an IV access tube that is inserted into one of your veins.  The area where the catheter will be inserted will be washed and shaved. This is usually done in the groin but may be done in the fold of your arm (near your elbow) or in the wrist.  A medicine will be given to numb the area where the catheter will be inserted (local anesthetic).  The catheter will be inserted with a guide wire into an artery. The catheter is guided by using a type of X-ray (fluoroscopy) to the blood vessel being examined.  Dye is then injected into the catheter, and X-rays are taken. The dye helps to show where any narrowing or blockages are located. What happens after the procedure?  If the procedure is done through the leg, you will be kept in bed lying flat for several hours. You will be instructed to not bend or cross your legs.  The insertion site will be checked frequently.  The pulse in your feet or wrist will be checked frequently.  Additional blood tests, X-rays, and electrocardiography may be done.  You may need to stay in the hospital overnight for observation. This information is not intended to replace advice given to you by your health care provider. Make sure you discuss any questions you have with your health care provider. Document Released: 02/11/2005 Document Revised: 10/16/2015 Document Reviewed: 10/05/2012 Elsevier Interactive Patient  Education  2017 [employer] After This sheet gives you information about how to care for yourself after your procedure. Your health care provider may also give you more specific instructions. If you have problems or questions, contact your health care provider. What can I expect after the procedure? After the procedure, it is common to have bruising and tenderness at the catheter insertion area. Follow these instructions at home: Insertion site care  Follow instructions from your health care provider about how to take care of your insertion site. Make sure you: ? Wash your hands with soap and water before you change your bandage (dressing). If soap and water are not available, use hand sanitizer. ? Change your dressing as told by your health care provider. ? Leave stitches (sutures), skin glue, or adhesive strips in place. These skin closures may need to stay in place for 2 weeks or longer. If adhesive strip edges start to loosen and curl up, you may trim the loose edges. Do not remove adhesive strips completely unless your health care provider tells you to do that.  Do not take baths, swim, or use a hot tub until  your health care provider approves.  You may shower 24-48 hours after the procedure or as told by your health care provider. ? Gently wash the site with plain soap and water. ? Pat the area dry with a clean towel. ? Do not rub the site. This may cause bleeding.  Do not apply powder or lotion to the site. Keep the site clean and dry.  Check your insertion site every day for signs of infection. Check for: ? Redness, swelling, or pain. ? Fluid or blood. ? Warmth. ? Pus or a bad smell. Activity  Rest as told by your health care provider, usually for 1-2 days.  Do not lift anything that is heavier than 10 lbs. (4.5 kg) or as told by your health care provider.  Do not drive for 24 hours if you were given a medicine to help you relax (sedative).  Do not  drive or use heavy machinery while taking prescription pain medicine. General instructions  Return to your normal activities as told by your health care provider, usually in about a week. Ask your health care provider what activities are safe for you.  If the catheter site starts bleeding, lie flat and put pressure on the site. If the bleeding does not stop, get help right away. This is a medical emergency.  Drink enough fluid to keep your urine clear or pale yellow. This helps flush the contrast dye from your body.  Take over-the-counter and prescription medicines only as told by your health care provider.  Keep all follow-up visits as told by your health care provider. This is important. Contact a health care provider if:  You have a fever or chills.  You have redness, swelling, or pain around your insertion site.  You have fluid or blood coming from your insertion site.  The insertion site feels warm to the touch.  You have pus or a bad smell coming from your insertion site.  You have bruising around the insertion site.  You notice blood collecting in the tissue around the catheter site (hematoma). The hematoma may be painful to the touch. Get help right away if:  You have severe pain at the catheter insertion area.  The catheter insertion area swells very fast.  The catheter insertion area is bleeding, and the bleeding does not stop when you hold steady pressure on the area.  The area near or just beyond the catheter insertion site becomes pale, cool, tingly, or numb. These symptoms may represent a serious problem that is an emergency. Do not wait to see if the symptoms will go away. Get medical help right away. Call your local emergency services (911 in the [place] Do not drive yourself to the hospital. Summary  After the procedure, it is common to have bruising and tenderness at the catheter insertion area.  After the procedure, it is important to rest and drink plenty  of fluids.  Do not take baths, swim, or use a hot tub until your health care provider says it is okay to do so. You may shower 24-48 hours after the procedure or as told by your health care provider.  If the catheter site starts bleeding, lie flat and put pressure on the site. If the bleeding does not stop, get help right away. This is a medical emergency. This information is not intended to replace advice given to you by your health care provider. Make sure you discuss any questions you have with your health care provider. Document Released: 11/20/2004 Document  Revised: 04/08/2016 Document Reviewed: 04/08/2016 Elsevier Interactive Patient Education  [employer].

## 2017-08-28 LAB — BASIC METABOLIC PANEL
BUN/Creatinine Ratio: 16 (ref 9–20)
BUN: 17 mg/dL (ref 6–24)
CO2: 24 mmol/L (ref 20–29)
Calcium: 9.1 mg/dL (ref 8.7–10.2)
Chloride: 100 mmol/L (ref 96–106)
Creatinine, Ser: 1.07 mg/dL (ref 0.76–1.27)
GFR calc Af Amer: 92 mL/min/{1.73_m2} (ref >59)
GFR calc non Af Amer: 79 mL/min/{1.73_m2} (ref >59)
Glucose: 150 mg/dL — ABNORMAL HIGH (ref 65–99)
Potassium: 5.1 mmol/L (ref 3.5–5.2)
Sodium: 140 mmol/L (ref 134–144)

## 2017-08-28 LAB — CBC
Hematocrit: 39.8 % (ref 37.5–51.0)
Hemoglobin: 13.4 g/dL (ref 13.0–17.7)
MCH: 28.8 pg (ref 26.6–33.0)
MCHC: 33.7 g/dL (ref 31.5–35.7)
MCV: 86 fL (ref 79–97)
Platelets: 213 10*3/uL (ref 150–379)
RBC: 4.65 x10E6/uL (ref 4.14–5.80)
RDW: 14 % (ref 12.3–15.4)
WBC: 8.3 10*3/uL (ref 3.4–10.8)

## 2017-08-30 NOTE — Telephone Encounter (Signed)
Patient scheduled carotid US with [facility] 09/13/17

## 2017-08-31 ENCOUNTER — Telehealth: Payer: Self-pay | Admitting: Cardiovascular Disease

## 2017-08-31 NOTE — Telephone Encounter (Signed)
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

## 2017-08-31 NOTE — Telephone Encounter (Signed)
I spoke with patient. 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 We discussed lifting restrictions and concerns for overuse of hand/arm only 2 days post-cath. Advised to refrain from participating Saturday night and also advised to have his wife discuss with Dr. Fletcher Anon after the cath. Patient is agreeable and states he is ok not participating Saturday night. Advised this might be best. Reviewed pre-cath instructions. Patient will hold morning medications the day of his cath. Patient appreciative of the information and is agreeable with plan.

## 2017-09-02 ENCOUNTER — Telehealth: Payer: Self-pay | Admitting: Cardiovascular Disease

## 2017-09-02 ENCOUNTER — Ambulatory Visit
Admission: RE | Admit: 2017-09-02 | Discharge: 2017-09-02 | Disposition: A | Discharge status: Home / Self Care | Payer: Self-pay | Source: Ambulatory Visit | Attending: Cardiovascular Disease | Admitting: Cardiovascular Disease

## 2017-09-02 ENCOUNTER — Encounter
Admission: RE | Disposition: A | Discharge status: Home / Self Care | Payer: Self-pay | Source: Ambulatory Visit | Attending: Cardiovascular Disease

## 2017-09-02 DIAGNOSIS — Z7984 Long term (current) use of oral hypoglycemic drugs: Secondary | ICD-10-CM | POA: Insufficient documentation

## 2017-09-02 DIAGNOSIS — I110 Hypertensive heart disease with heart failure: Secondary | ICD-10-CM | POA: Insufficient documentation

## 2017-09-02 DIAGNOSIS — R0602 Shortness of breath: Secondary | ICD-10-CM

## 2017-09-02 DIAGNOSIS — G4733 Obstructive sleep apnea (adult) (pediatric): Secondary | ICD-10-CM | POA: Insufficient documentation

## 2017-09-02 DIAGNOSIS — Z6841 Body Mass Index (BMI) 40.0 and over, adult: Secondary | ICD-10-CM | POA: Insufficient documentation

## 2017-09-02 DIAGNOSIS — R0989 Other specified symptoms and signs involving the circulatory and respiratory systems: Secondary | ICD-10-CM | POA: Insufficient documentation

## 2017-09-02 DIAGNOSIS — Z79899 Other long term (current) drug therapy: Secondary | ICD-10-CM | POA: Insufficient documentation

## 2017-09-02 DIAGNOSIS — I5032 Chronic diastolic (congestive) heart failure: Secondary | ICD-10-CM | POA: Insufficient documentation

## 2017-09-02 DIAGNOSIS — Z8249 Family history of ischemic heart disease and other diseases of the circulatory system: Secondary | ICD-10-CM | POA: Insufficient documentation

## 2017-09-02 DIAGNOSIS — I2584 Coronary atherosclerosis due to calcified coronary lesion: Secondary | ICD-10-CM | POA: Insufficient documentation

## 2017-09-02 DIAGNOSIS — I2510 Atherosclerotic heart disease of native coronary artery without angina pectoris: Secondary | ICD-10-CM | POA: Insufficient documentation

## 2017-09-02 DIAGNOSIS — E119 Type 2 diabetes mellitus without complications: Secondary | ICD-10-CM | POA: Insufficient documentation

## 2017-09-02 DIAGNOSIS — R0789 Other chest pain: Secondary | ICD-10-CM | POA: Insufficient documentation

## 2017-09-02 DIAGNOSIS — I272 Pulmonary hypertension, unspecified: Secondary | ICD-10-CM | POA: Insufficient documentation

## 2017-09-02 DIAGNOSIS — F329 Major depressive disorder, single episode, unspecified: Secondary | ICD-10-CM | POA: Insufficient documentation

## 2017-09-02 DIAGNOSIS — I209 Angina pectoris, unspecified: Secondary | ICD-10-CM

## 2017-09-02 DIAGNOSIS — J45909 Unspecified asthma, uncomplicated: Secondary | ICD-10-CM | POA: Insufficient documentation

## 2017-09-02 DIAGNOSIS — R079 Chest pain, unspecified: Secondary | ICD-10-CM

## 2017-09-02 HISTORY — PX: RIGHT/LEFT HEART CATH AND CORONARY ANGIOGRAPHY: CATH118266

## 2017-09-02 LAB — GLUCOSE, CAPILLARY: Glucose-Capillary: 127 mg/dL — ABNORMAL HIGH (ref 65–99)

## 2017-09-02 SURGERY — RIGHT/LEFT HEART CATH AND CORONARY ANGIOGRAPHY
Anesthesia: Moderate Sedation

## 2017-09-02 MED ORDER — SODIUM CHLORIDE 0.9% FLUSH: 3.0000 mL | Freq: Two times a day (BID) | INTRAVENOUS | Status: DC

## 2017-09-02 MED ORDER — ASPIRIN 81 MG PO CHEW
CHEWABLE_TABLET | ORAL | Status: AC
  Filled 2017-09-02: qty 1

## 2017-09-02 MED ORDER — HEPARIN (PORCINE) IN NACL 1000-0.9 UT/500ML-% IV SOLN
INTRAVENOUS | Status: AC
  Filled 2017-09-02: qty 500

## 2017-09-02 MED ORDER — MIDAZOLAM HCL 2 MG/2ML IJ SOLN
INTRAMUSCULAR | Status: DC | PRN
  Administered 2017-09-02: 0.5 mg via INTRAVENOUS
  Administered 2017-09-02: 1 mg via INTRAVENOUS
  Administered 2017-09-02: 0.5 mg via INTRAVENOUS

## 2017-09-02 MED ORDER — SODIUM CHLORIDE 0.9 % IV SOLN
INTRAVENOUS | Status: DC
  Administered 2017-09-02: 08:00:00 via INTRAVENOUS

## 2017-09-02 MED ORDER — VERAPAMIL HCL 2.5 MG/ML IV SOLN
INTRAVENOUS | Status: AC
  Filled 2017-09-02: qty 2

## 2017-09-02 MED ORDER — SODIUM CHLORIDE 0.9% FLUSH: 3.0000 mL | INTRAVENOUS | Status: DC | PRN

## 2017-09-02 MED ORDER — SODIUM CHLORIDE 0.9 % IV SOLN: 250.0000 mL | INTRAVENOUS | Status: DC | PRN

## 2017-09-02 MED ORDER — ACETAMINOPHEN 325 MG PO TABS: 650.0000 mg | ORAL_TABLET | ORAL | Status: DC | PRN

## 2017-09-02 MED ORDER — FENTANYL CITRATE (PF) 100 MCG/2ML IJ SOLN
INTRAMUSCULAR | Status: AC
  Filled 2017-09-02: qty 2

## 2017-09-02 MED ORDER — ONDANSETRON HCL 4 MG/2ML IJ SOLN: 4.0000 mg | Freq: Four times a day (QID) | INTRAMUSCULAR | Status: DC | PRN

## 2017-09-02 MED ORDER — MIDAZOLAM HCL 2 MG/2ML IJ SOLN
INTRAMUSCULAR | Status: AC
  Filled 2017-09-02: qty 2

## 2017-09-02 MED ORDER — FUROSEMIDE 40 MG PO TABS: 40.0000 mg | ORAL_TABLET | Freq: Every day | ORAL | 3 refills | Status: DC

## 2017-09-02 MED ORDER — FENTANYL CITRATE (PF) 100 MCG/2ML IJ SOLN
INTRAMUSCULAR | Status: DC | PRN
  Administered 2017-09-02 (×2): 25 ug via INTRAVENOUS
  Administered 2017-09-02: 50 ug via INTRAVENOUS

## 2017-09-02 MED ORDER — CARVEDILOL 6.25 MG PO TABS: 6.2500 mg | ORAL_TABLET | Freq: Two times a day (BID) | ORAL | 3 refills | Status: DC

## 2017-09-02 MED ORDER — VERAPAMIL HCL 2.5 MG/ML IV SOLN
INTRAVENOUS | Status: DC | PRN
  Administered 2017-09-02: 2.5 mg via INTRA_ARTERIAL

## 2017-09-02 MED ORDER — ASPIRIN 81 MG PO CHEW
81.0000 mg | CHEWABLE_TABLET | ORAL | Status: AC
  Administered 2017-09-02: 81 mg via ORAL

## 2017-09-02 MED ORDER — HEPARIN SODIUM (PORCINE) 1000 UNIT/ML IJ SOLN
INTRAMUSCULAR | Status: AC
  Filled 2017-09-02: qty 1

## 2017-09-02 SURGICAL SUPPLY — 13 items
CATH BALLN WEDGE 5F 110CM (CATHETERS) ×3 IMPLANT
CATH INFINITI 5FR ANG PIGTAIL (CATHETERS) ×3 IMPLANT
CATH INFINITI JR4 5F (CATHETERS) ×3 IMPLANT
CATH OPTITORQUE JACKY 4.0 5F (CATHETERS) ×3 IMPLANT
DEVICE RAD COMP TR BAND LRG (VASCULAR PRODUCTS) ×3 IMPLANT
GUIDEWIRE EMER 3M J .025X150CM (WIRE) ×3 IMPLANT
KIT MANI 3VAL PERCEP (MISCELLANEOUS) ×3 IMPLANT
KIT RIGHT HEART (MISCELLANEOUS) ×3 IMPLANT
NEEDLE PERC 21GX4CM (NEEDLE) ×3 IMPLANT
PACK CARDIAC CATH (CUSTOM PROCEDURE TRAY) ×3 IMPLANT
SHEATH GLIDE SLENDER 4/5FR (SHEATH) ×3 IMPLANT
SHEATH RAIN RADIAL 21G 6FR (SHEATH) ×3 IMPLANT
WIRE ROSEN-J .035X260CM (WIRE) ×3 IMPLANT

## 2017-09-02 NOTE — Progress Notes (Signed)
Patient remains clinically stable post cath, TR band off with no bleeding nor hematoma visible, discharge instructions given with questions answered, denies complaints. Heart failure teaching packet given to wife,with questions answered.

## 2017-09-02 NOTE — Discharge Instructions (Signed)
Moderate Conscious Sedation, Adult, Care After These instructions provide you with information about caring for yourself after your procedure. Your health care provider may also give you more specific instructions. Your treatment has been planned according to current medical practices, but problems sometimes occur. Call your health care provider if you have any problems or questions after your procedure. What can I expect after the procedure? After your procedure, it is common:  To feel sleepy for several hours.  To feel clumsy and have poor balance for several hours.  To have poor judgment for several hours.  To vomit if you eat too soon.  Follow these instructions at home: For at least 24 hours after the procedure:   Do not: ? Participate in activities where you could fall or become injured. ? Drive. ? Use heavy machinery. ? Drink alcohol. ? Take sleeping pills or medicines that cause drowsiness. ? Make important decisions or sign legal documents. ? Take care of children on your own.  Rest. Eating and drinking  Follow the diet recommended by your health care provider.  If you vomit: ? Drink water, juice, or soup when you can drink without vomiting. ? Make sure you have little or no nausea before eating solid foods. General instructions  Have a responsible adult stay with you until you are awake and alert.  Take over-the-counter and prescription medicines only as told by your health care provider.  If you smoke, do not smoke without supervision.  Keep all follow-up visits as told by your health care provider. This is important. Contact a health care provider if:  You keep feeling nauseous or you keep vomiting.  You feel light-headed.  You develop a rash.  You have a fever. Get help right away if:  You have trouble breathing. This information is not intended to replace advice given to you by your health care provider. Make sure you discuss any questions you have  with your health care provider. Document Released: 02/22/2013 Document Revised: 10/07/2015 Document Reviewed: 08/24/2015 Elsevier Interactive Patient Education  2018 [place] Refer to this sheet in the next few weeks. These instructions provide you with information about caring for yourself after your procedure. Your health care provider may also give you more specific instructions. Your treatment has been planned according to current medical practices, but problems sometimes occur. Call your health care provider if you have any problems or questions after your procedure. What can I expect after the procedure? After your procedure, it is typical to have the following:  Bruising at the radial site that usually fades within 1-2 weeks.  Blood collecting in the tissue (hematoma) that may be painful to the touch. It should usually decrease in size and tenderness within 1-2 weeks.  Follow these instructions at home:  Take medicines only as directed by your health care provider.  You may shower 24-48 hours after the procedure or as directed by your health care provider. Remove the bandage (dressing) and gently wash the site with plain soap and water. Pat the area dry with a clean towel. Do not rub the site, because this may cause bleeding.  Do not take baths, swim, or use a hot tub until your health care provider approves.  Check your insertion site every day for redness, swelling, or drainage.  Do not apply powder or lotion to the site.  Do not flex or bend the affected arm for 24 hours or as directed by your health care provider.  Do not  push or pull heavy objects with the affected arm for 24 hours or as directed by your health care provider.  Do not lift over 10 lb (4.5 kg) for 5 days after your procedure or as directed by your health care provider.  Ask your health care provider when it is okay to: ? Return to work or school. ? Resume usual physical activities or  sports. ? Resume sexual activity.  Do not drive home if you are discharged the same day as the procedure. Have someone else drive you.  You may drive 24 hours after the procedure unless otherwise instructed by your health care provider.  Do not operate machinery or power tools for 24 hours after the procedure.  If your procedure was done as an outpatient procedure, which means that you went home the same day as your procedure, a responsible adult should be with you for the first 24 hours after you arrive home.  Keep all follow-up visits as directed by your health care provider. This is important. Contact a health care provider if:  You have a fever.  You have chills.  You have increased bleeding from the radial site. Hold pressure on the site. Get help right away if:  You have unusual pain at the radial site.  You have redness, warmth, or swelling at the radial site.  You have drainage (other than a small amount of blood on the dressing) from the radial site.  The radial site is bleeding, and the bleeding does not stop after 30 minutes of holding steady pressure on the site.  Your arm or hand becomes pale, cool, tingly, or numb. This information is not intended to replace advice given to you by your health care provider. Make sure you discuss any questions you have with your health care provider. Document Released: 06/06/2010 Document Revised: 10/10/2015 Document Reviewed: 11/20/2013 Elsevier Interactive Patient Education  2018 [employer].   Resume Metformin in 2 days.

## 2017-09-02 NOTE — Interval H&P Note (Signed)
History and Physical Interval Note:  09/02/2017 9:43 AM  Brett Marshall  has presented today for surgery, with the diagnosis of LT and RT Heart Cath   Chest Pain  SOB  The various methods of treatment have been discussed with the patient and family. After consideration of risks, benefits and other options for treatment, the patient has consented to  Procedure(s): RIGHT/LEFT HEART CATH AND CORONARY ANGIOGRAPHY (N/A) as a surgical intervention .  The patient's history has been reviewed, patient examined, no change in status, stable for surgery.  I have reviewed the patient's chart and labs.  Questions were answered to the patient's satisfaction.     [name]

## 2017-09-02 NOTE — Telephone Encounter (Signed)
Per Dr. Fletcher Anon:  "I switch hydrochlorothiazide to furosemide and added carvedilol.  Check basic metabolic profile in 1 week. "  Patient awaiting discharge from cardiac catheterization this morning. Will call patient today to make aware of labs.  Lab orders placed.

## 2017-09-02 NOTE — Telephone Encounter (Signed)
S/w patient who is aware and agreeable to labs in one week at the [facility].

## 2017-09-02 NOTE — Progress Notes (Signed)
Patient clinically stable post heart cath per Dr Fletcher Anon, wife at bedside, denies complaints, TR band intact with 10 ml air, elevated to pillow. No bleeding nor hematoma at right radial site. Dr Fletcher Anon out to speak with patient and wife with questions answered.

## 2017-09-03 ENCOUNTER — Encounter: Payer: Self-pay | Admitting: Cardiovascular Disease

## 2017-09-10 ENCOUNTER — Other Ambulatory Visit
Admission: RE | Admit: 2017-09-10 | Discharge: 2017-09-10 | Disposition: A | Discharge status: Home / Self Care | Payer: Self-pay | Source: Ambulatory Visit | Attending: Cardiovascular Disease | Admitting: Cardiovascular Disease

## 2017-09-10 ENCOUNTER — Other Ambulatory Visit: Payer: Self-pay | Admitting: *Deleted

## 2017-09-10 DIAGNOSIS — R079 Chest pain, unspecified: Secondary | ICD-10-CM | POA: Insufficient documentation

## 2017-09-10 LAB — BASIC METABOLIC PANEL
Anion gap: 10 (ref 5–15)
BUN: 22 mg/dL — AB (ref 6–20)
CALCIUM: 8.8 mg/dL — AB (ref 8.9–10.3)
CO2: 24 mmol/L (ref 22–32)
Chloride: 102 mmol/L (ref 101–111)
Creatinine, Ser: 1.2 mg/dL (ref 0.61–1.24)
GFR calc Af Amer: >60 mL/min (ref >60)
GLUCOSE: 89 mg/dL (ref 65–99)
POTASSIUM: 4 mmol/L (ref 3.5–5.1)
Sodium: 136 mmol/L (ref 135–145)

## 2017-09-13 ENCOUNTER — Other Ambulatory Visit: Payer: Self-pay | Admitting: Family Medicine

## 2017-09-13 ENCOUNTER — Ambulatory Visit (INDEPENDENT_AMBULATORY_CARE_PROVIDER_SITE_OTHER): Payer: Self-pay

## 2017-09-13 DIAGNOSIS — M5441 Lumbago with sciatica, right side: Secondary | ICD-10-CM

## 2017-09-13 DIAGNOSIS — G8929 Other chronic pain: Secondary | ICD-10-CM

## 2017-09-13 DIAGNOSIS — R0989 Other specified symptoms and signs involving the circulatory and respiratory systems: Secondary | ICD-10-CM

## 2017-09-13 DIAGNOSIS — M5136 Other intervertebral disc degeneration, lumbar region: Secondary | ICD-10-CM

## 2017-09-14 ENCOUNTER — Telehealth: Payer: Self-pay | Admitting: *Deleted

## 2017-09-14 MED ORDER — FUROSEMIDE 40 MG PO TABS: 20.0000 mg | ORAL_TABLET | Freq: Every day | ORAL | 3 refills | Status: DC

## 2017-09-14 NOTE — Telephone Encounter (Signed)
Reviewed results and recommendations to decrease furosemide down to 20 mg once daily which would be 1/2 tablet of his current supply. He verbalized understanding, read back instructions, and had no further questions at this time.

## 2017-09-14 NOTE — Telephone Encounter (Signed)
-----   Message from Wellington Hampshire, MD sent at 09/13/2017  1:49 PM EDT ----- Inform patient that labs showed slight dehydration.  Decrease furosemide to 20 mg daily.

## 2017-09-17 ENCOUNTER — Encounter: Payer: Self-pay | Admitting: Cardiovascular Disease

## 2017-09-17 ENCOUNTER — Ambulatory Visit: Payer: Self-pay | Admitting: Cardiovascular Disease

## 2017-09-17 VITALS — BP 126/86 | HR 83 | Ht 68.0 in | Wt 371.5 lb

## 2017-09-17 DIAGNOSIS — E785 Hyperlipidemia, unspecified: Secondary | ICD-10-CM

## 2017-09-17 DIAGNOSIS — I10 Essential (primary) hypertension: Secondary | ICD-10-CM

## 2017-09-17 DIAGNOSIS — I5022 Chronic systolic (congestive) heart failure: Secondary | ICD-10-CM

## 2017-09-17 MED ORDER — FUROSEMIDE 20 MG PO TABS: 20.0000 mg | ORAL_TABLET | Freq: Every day | ORAL | 3 refills | Status: DC

## 2017-09-17 NOTE — Patient Instructions (Signed)
Medication Instructions:  Your physician recommends that you continue on your current medications as directed. Please refer to the Current Medication list given to you today.   Labwork: none  Testing/Procedures: none  Follow-Up: Your physician recommends that you schedule a follow-up appointment in: 3 MONTHS WITH DR ARIDA.  If you need a refill on your cardiac medications before your next appointment, please call your pharmacy.   

## 2017-09-17 NOTE — Progress Notes (Signed)
Cardiology Office Note   Date:  09/17/2017   ID:  Brett Marshall, DOB [birth year], MRN [id number]  PCP:  [name], MD  Cardiologist:   [name], MD   Chief Complaint  Patient presents with  . OTHER    F/u cath c/o chest tightness and pain. Meds reviewed verbally with pt.     History of Present Illness: Brett Marshall is a 52 y.o. male who is here today for a follow-up visit regarding chest pain and shortness of breath. The patient has  multiple chronic medical conditions that include type 2 diabetes, essential hypertension, morbid obesity and degenerative disc disease. He also has sleep apnea but does not tolerate CPAP very well. The patient has known history of morbid obesity with previous peak weight of 430 pounds.  He has managed to get his weight down to around 370 with diet.  He was seen recently for intermittent exertional chest tightness and shortness of breath.   I proceeded with a right and left cardiac catheterization which showed mild nonobstructive coronary artery disease, mildly reduced LV systolic function with an EF of 45 to 50% with global hypokinesis.  Right heart catheterization showed moderately elevated filling pressures with a wedge pressure of 22 mmHg, mild pulmonary hypertension and high cardiac output.  I switch hydrochlorothiazide to furosemide and added carvedilol.  He reports stable symptoms overall.  The dose of furosemide was decreased to 20 mg daily after his labs showed slight volume depletion.   Past Medical History:  Diagnosis Date  . Asthma   . DDD (degenerative disc disease), lumbar   . Depression   . Diabetes mellitus without complication ([place])   . Essential hypertension, benign   . Leg swelling    Left  . Low back pain   . OSA on CPAP    non compliant    Past Surgical History:  Procedure Laterality Date  . CARDIAC CATHETERIZATION     [place] no stents.  Marland Kitchen RIGHT/LEFT HEART CATH AND CORONARY ANGIOGRAPHY N/A 09/02/2017   Procedure: RIGHT/LEFT HEART CATH AND CORONARY ANGIOGRAPHY;  Surgeon: Wellington Hampshire, MD;  Location: [facility];  Service: Cardiovascular;  Laterality: N/A;     Current Outpatient Medications  Medication Sig Dispense Refill  . albuterol (PROVENTIL HFA;VENTOLIN HFA) 108 (90 Base) MCG/ACT inhaler Inhale 1-2 puffs into the lungs every 6 (six) hours as needed for wheezing or shortness of breath. 1 Inhaler 0  . carvedilol (COREG) 6.25 MG tablet Take 1 tablet (6.25 mg total) by mouth 2 (two) times daily. 60 tablet 3  . cyclobenzaprine (FLEXERIL) 10 MG tablet Take 1 tablet (10 mg total) by mouth 3 (three) times daily as needed. For spasms 90 tablet 5  . DULoxetine (CYMBALTA) 30 MG capsule Take 1 capsule (30 mg total) by mouth daily. 90 capsule 1  . enalapril (VASOTEC) 20 MG tablet Take 1 tablet (20 mg total) by mouth 2 (two) times daily. 60 tablet 0  . furosemide (LASIX) 20 MG tablet Take 20 mg by mouth daily.    Marland Kitchen glipiZIDE (GLUCOTROL) 5 MG tablet Take 1 tablet (5 mg total) by mouth 2 (two) times daily before a meal. 180 tablet 1  . glucose blood (GE100 BLOOD GLUCOSE TEST) test strip Use as instructed 100 each 12  . HYDROcodone-acetaminophen (NORCO/VICODIN) 5-325 MG tablet Take 1-2 tablets by mouth every 6 (six) hours as needed for moderate pain. 60 tablet 0  . Lancets (ONETOUCH ULTRASOFT) lancets Check sugar twice  daily 100 each 12  . meloxicam (MOBIC) 15 MG tablet Take 1 tablet (15 mg total) by mouth daily. 90 tablet 1  . metFORMIN (GLUCOPHAGE) 1000 MG tablet Take 1 tablet (1,000 mg total) by mouth 2 (two) times daily with a meal. 180 tablet 1   No current facility-administered medications for this visit.     Allergies:   Patient has no known allergies.    Social History:  The patient  reports that he has never smoked. He has never used smokeless tobacco. He reports that he drinks alcohol. He reports that he does not use drugs.   Family History:  The patient's family history  includes Asthma in his mother; Diabetes in his mother; Heart attack in his father; Heart disease in his father; Heart disease (age of onset: 35) in his brother; Sjogren's syndrome in his sister.    ROS:  Please see the history of present illness.   Otherwise, review of systems are positive for none.   All other systems are reviewed and negative.   PHYSICAL EXAM: VS:  BP 126/86 (BP Location: Left Wrist, Patient Position: Sitting, Cuff Size: Large)   Pulse 83   Ht 5' 8"  (1.727 m)   Wt (!) 371 lb 8 oz (168.5 kg)   BMI 56.49 kg/m  , BMI Body mass index is 56.49 kg/m. GEN: Well nourished, well developed, in no acute distress  HEENT: normal  Neck: no JVD or masses.  Bilateral carotid bruits. Cardiac: RRR; norubs, or gallops.  1/ systolic murmur at the aortic area.  Mild bilateral leg edema. Respiratory:  clear to auscultation bilaterally, normal work of breathing GI: soft, nontender, nondistended, + BS MS: no deformity or atrophy  Skin: warm and dry, no rash Neuro:  Strength and sensation are intact Psych: euthymic mood, full affect Right radial pulses normal with no hematoma.  EKG:  EKG is ordered today. The ekg ordered today demonstrates normal sinus rhythm with low voltage and no significant ST or T wave changes.   Recent Labs: 08/23/2017: ALT 43 08/27/2017: Hemoglobin 13.4; Platelets 213 09/10/2017: BUN 22; Creatinine, Ser 1.20; Potassium 4.0; Sodium 136    Lipid Panel    Component Value Date/Time   CHOL 163 01/11/2016 1008   TRIG 83 01/11/2016 1008   HDL 47 01/11/2016 1008   CHOLHDL 3.5 01/11/2016 1008   VLDL 17 01/11/2016 1008   LDLCALC 99 01/11/2016 1008      Wt Readings from Last 3 Encounters:  09/17/17 (!) 371 lb 8 oz (168.5 kg)  09/02/17 (!) 371 lb (168.3 kg)  08/27/17 (!) 371 lb 8 oz (168.5 kg)     PAD Screen 08/27/2017  Previous PAD dx? No  Previous surgical procedure? No  Pain with walking? Yes  Subsides with rest? No  Feet/toe relief with dangling? Yes    Painful, non-healing ulcers? No  Extremities discolored? Yes      ASSESSMENT AND PLAN:  1.  Chronic systolic heart failure with mildly reduced LV systolic function due to nonischemic cardiomyopathy: Recent cardiac catheterization showed mild nonobstructive coronary artery disease but his EF was noted to be mildly reduced. Carvedilol was initiated with improvement in blood pressure.  Continue enalapril. He appears to be euvolemic on small dose of furosemide. I discussed with him the importance of healthy lifestyle changes including regular exercise, healthy diet and weight loss.  2.  Bilateral carotid bruits: Carotid Doppler showed normal carotid arteries.  3.  Essential hypertension: Blood pressure improved with addition of carvedilol.  4.  Borderline hyperlipidemia: We should consider repeat lipid profile and treatment with a statin given known diabetes and mild nonobstructive coronary artery disease.  5.  Sleep apnea: He reports intolerance to CPAP which I suspect is contributing to his heart failure.   Disposition:   FU with me in 3 month  Signed,  [name], MD  09/17/2017 3:16 PM    [facility]

## 2017-10-01 ENCOUNTER — Ambulatory Visit: Payer: Self-pay | Admitting: Cardiovascular Disease

## 2017-10-08 ENCOUNTER — Other Ambulatory Visit: Payer: Self-pay | Admitting: Family Medicine

## 2017-10-08 DIAGNOSIS — M5136 Other intervertebral disc degeneration, lumbar region: Secondary | ICD-10-CM

## 2017-10-08 NOTE — Telephone Encounter (Signed)
Refill of Mobic and Norco  LOV 08/23/17  Dr. [name]  LRF Mobic 05/24/17  #90  1 refill  LRF Norco 08/01/17  #60  0 refills  [place], [place]

## 2017-10-11 ENCOUNTER — Encounter: Payer: Self-pay | Admitting: Family Medicine

## 2017-11-04 ENCOUNTER — Other Ambulatory Visit: Payer: Self-pay | Admitting: Family Medicine

## 2017-11-04 DIAGNOSIS — I10 Essential (primary) hypertension: Secondary | ICD-10-CM

## 2017-11-07 DIAGNOSIS — K7581 Nonalcoholic steatohepatitis (NASH): Secondary | ICD-10-CM | POA: Insufficient documentation

## 2017-11-14 ENCOUNTER — Other Ambulatory Visit: Payer: Self-pay | Admitting: Family Medicine

## 2017-11-14 DIAGNOSIS — G8929 Other chronic pain: Secondary | ICD-10-CM

## 2017-11-14 DIAGNOSIS — M5441 Lumbago with sciatica, right side: Principal | ICD-10-CM

## 2017-11-15 NOTE — Telephone Encounter (Signed)
Flexeril refill Last Refill:12/01/16 #90 with 5 reflls Last OV: 08/23/17 PCP: Dr. [name] Pharmacy: [facility]

## 2017-11-18 NOTE — Telephone Encounter (Signed)
Refill request Cyclobenzaprine sent to Dr [name]

## 2017-12-07 ENCOUNTER — Other Ambulatory Visit: Payer: Self-pay | Admitting: Family Medicine

## 2017-12-07 ENCOUNTER — Encounter: Payer: Self-pay | Admitting: Family Medicine

## 2017-12-07 DIAGNOSIS — M5136 Other intervertebral disc degeneration, lumbar region: Secondary | ICD-10-CM

## 2017-12-08 ENCOUNTER — Telehealth: Payer: Self-pay | Admitting: Family Medicine

## 2017-12-08 ENCOUNTER — Encounter (INDEPENDENT_AMBULATORY_CARE_PROVIDER_SITE_OTHER): Payer: Self-pay

## 2017-12-08 ENCOUNTER — Ambulatory Visit: Payer: Self-pay | Admitting: Family Medicine

## 2017-12-08 ENCOUNTER — Encounter: Payer: Self-pay | Admitting: Family Medicine

## 2017-12-08 ENCOUNTER — Other Ambulatory Visit: Payer: Self-pay

## 2017-12-08 VITALS — BP 128/82 | HR 104 | Temp 98.8°F | Resp 16 | Ht 69.29 in | Wt 364.0 lb

## 2017-12-08 DIAGNOSIS — I10 Essential (primary) hypertension: Secondary | ICD-10-CM

## 2017-12-08 DIAGNOSIS — R42 Dizziness and giddiness: Secondary | ICD-10-CM

## 2017-12-08 DIAGNOSIS — R1084 Generalized abdominal pain: Secondary | ICD-10-CM

## 2017-12-08 DIAGNOSIS — Z1211 Encounter for screening for malignant neoplasm of colon: Secondary | ICD-10-CM

## 2017-12-08 DIAGNOSIS — E119 Type 2 diabetes mellitus without complications: Secondary | ICD-10-CM

## 2017-12-08 DIAGNOSIS — K7581 Nonalcoholic steatohepatitis (NASH): Secondary | ICD-10-CM

## 2017-12-08 DIAGNOSIS — M5136 Other intervertebral disc degeneration, lumbar region: Secondary | ICD-10-CM

## 2017-12-08 DIAGNOSIS — I5022 Chronic systolic (congestive) heart failure: Secondary | ICD-10-CM

## 2017-12-08 DIAGNOSIS — I272 Pulmonary hypertension, unspecified: Secondary | ICD-10-CM

## 2017-12-08 LAB — CBC WITH DIFFERENTIAL/PLATELET
BASOS ABS: 0 10*3/uL (ref 0.0–0.2)
Basos: 0 %
EOS (ABSOLUTE): 0.3 10*3/uL (ref 0.0–0.4)
EOS: 3 %
Hematocrit: 40.8 % (ref 37.5–51.0)
Hemoglobin: 13.4 g/dL (ref 13.0–17.7)
Immature Grans (Abs): 0 10*3/uL (ref 0.0–0.1)
Immature Granulocytes: 0 %
LYMPHS ABS: 2.1 10*3/uL (ref 0.7–3.1)
Lymphs: 22 %
MCH: 28.2 pg (ref 26.6–33.0)
MCHC: 32.8 g/dL (ref 31.5–35.7)
MCV: 86 fL (ref 79–97)
MONOS ABS: 0.7 10*3/uL (ref 0.1–0.9)
Monocytes: 7 %
Neutrophils Absolute: 6.6 10*3/uL (ref 1.4–7.0)
Neutrophils: 68 %
PLATELETS: 249 10*3/uL (ref 150–450)
RBC: 4.75 x10E6/uL (ref 4.14–5.80)
RDW: 14.2 % (ref 12.3–15.4)
WBC: 9.7 10*3/uL (ref 3.4–10.8)

## 2017-12-08 LAB — POCT URINALYSIS DIP (MANUAL ENTRY)
Bilirubin, UA: NEGATIVE
Glucose, UA: NEGATIVE mg/dL
Ketones, POC UA: NEGATIVE mg/dL
Leukocytes, UA: NEGATIVE
Nitrite, UA: NEGATIVE
PROTEIN UA: NEGATIVE mg/dL
RBC UA: NEGATIVE
SPEC GRAV UA: 1.01 (ref 1.010–1.025)
UROBILINOGEN UA: 0.2 U/dL
pH, UA: 5 (ref 5.0–8.0)

## 2017-12-08 LAB — URINALYSIS, DIPSTICK ONLY
BILIRUBIN UA: NEGATIVE
GLUCOSE, UA: NEGATIVE
KETONES UA: NEGATIVE
Leukocytes, UA: NEGATIVE
Nitrite, UA: NEGATIVE
PROTEIN UA: NEGATIVE
RBC UA: NEGATIVE
SPEC GRAV UA: 1.011 (ref 1.005–1.030)
UUROB: 0.2 mg/dL (ref 0.2–1.0)
pH, UA: 6 (ref 5.0–7.5)

## 2017-12-08 LAB — LIPID PANEL
CHOLESTEROL TOTAL: 176 mg/dL (ref 100–199)
Chol/HDL Ratio: 4 ratio (ref 0.0–5.0)
HDL: 44 mg/dL (ref >39)
LDL CALC: 103 mg/dL — AB (ref 0–99)
Triglycerides: 143 mg/dL (ref 0–149)
VLDL CHOLESTEROL CAL: 29 mg/dL (ref 5–40)

## 2017-12-08 LAB — COMPREHENSIVE METABOLIC PANEL
ALK PHOS: 85 IU/L (ref 39–117)
ALT: 32 IU/L (ref 0–44)
AST: 22 IU/L (ref 0–40)
Albumin/Globulin Ratio: 1.5 (ref 1.2–2.2)
Albumin: 4.4 g/dL (ref 3.5–5.5)
BUN / CREAT RATIO: 22 — AB (ref 9–20)
BUN: 22 mg/dL (ref 6–24)
Bilirubin Total: 0.2 mg/dL (ref 0.0–1.2)
CHLORIDE: 101 mmol/L (ref 96–106)
CO2: 22 mmol/L (ref 20–29)
CREATININE: 0.99 mg/dL (ref 0.76–1.27)
Calcium: 9 mg/dL (ref 8.7–10.2)
GFR calc Af Amer: 101 mL/min/{1.73_m2} (ref >59)
GFR calc non Af Amer: 87 mL/min/{1.73_m2} (ref >59)
GLUCOSE: 188 mg/dL — AB (ref 65–99)
Globulin, Total: 3 g/dL (ref 1.5–4.5)
Potassium: 4.6 mmol/L (ref 3.5–5.2)
SODIUM: 139 mmol/L (ref 134–144)
Total Protein: 7.4 g/dL (ref 6.0–8.5)

## 2017-12-08 LAB — GLUCOSE, POCT (MANUAL RESULT ENTRY): POC Glucose: 183 mg/dl — AB (ref 70–99)

## 2017-12-08 LAB — POCT GLYCOSYLATED HEMOGLOBIN (HGB A1C): HEMOGLOBIN A1C: 6.9 % — AB (ref 4.0–5.6)

## 2017-12-08 NOTE — Telephone Encounter (Signed)
Please review these meds at the office visit today:  meloxicam 15 mg tab and hydrocodone-aceta,om 5-325 mg tab. [name], MD Appointment today at 10:20 am

## 2017-12-08 NOTE — Patient Instructions (Addendum)
START CHECKING BLOOD PRESSURE AT HOME EVERY DAY for two weeks.  NO IBUPROFEN, ALEVE, ADVIL, MOTRIN, NAPROXEN, OR MELOXICAM.  START ZANTAC 150MG  ONE TABLET TWICE DAILY BEFORE MEALS.   IF you received an x-ray today, you will receive an invoice from [facility]. Please contact [facility] at [phone] with questions or concerns regarding your invoice.   IF you received labwork today, you will receive an invoice from [place]. Please contact [facility] at [phone] with questions or concerns regarding your invoice.   Our billing staff will not be able to assist you with questions regarding bills from these companies.  You will be contacted with the lab results as soon as they are available. The fastest way to get your results is to activate your My Chart account. Instructions are located on the last page of this paperwork. If you have not heard from [place] regarding the results in 2 weeks, please contact this office.      Pulmonary Hypertension Pulmonary hypertension is high blood pressure within the arteries in your lungs (pulmonary arteries). It is different than having high blood pressure elsewhere in your body, such as blood pressure that is measured with a blood pressure cuff. Pulmonary hypertension makes it harder for blood to flow through the lungs. As a result, the heart must work harder to pump blood through the lungs, and it may be harder for you to breathe. Over time, this can weaken the heart muscle. Pulmonary hypertension is a serious condition and it can be fatal. What are the causes? Many different medical conditions can cause pulmonary hypertension. Pulmonary hypertension can be categorized by cause into five groups: Group 1 Pulmonary hypertension that is caused by abnormal growth of small blood vessels in the lungs (pulmonary arterial hypertension). The abnormal blood vessel growth may have no known cause, or it may be:  Passed along from a parent  (hereditary).  Caused by another disease, such as a connective tissue disease (including lupus or scleroderma) or HIV.  Caused by certain drugs or toxins.  Group 2 Pulmonary hypertension that is caused by weakness of the main chamber of the heart (left ventricle) or heart valve disease. Group 3 Pulmonary hypertension that is caused by lung disease or low oxygen levels. Causes in this group include:  Emphysema or chronic obstructive pulmonary disease (COPD).  Untreated sleep apnea.  Pulmonary fibrosis.  Group 4 Pulmonary hypertension that is caused by blood clots in the lungs (pulmonary emboli). Group 5 Other causes of pulmonary hypertension, such as sickle cell anemia, or a mix of multiple causes. What are the signs or symptoms? Symptoms of this condition include:  Shortness of breath. You may notice shortness of breath with: ? Activity, such as walking. ? No activity.  Tiredness and fatigue.  Dizziness or fainting.  Rapid heartbeat or feeling your heart flutter or skip a beat (palpitations).  Neck vein enlargement.  Bluish color to your lips and fingertips.  How is this diagnosed? This condition may be diagnosed by:  Chest X-ray.  Arterial blood gases. This test checks the acidity of your blood as well as your blood oxygen and carbon dioxide levels.  CT scan. This test can provide detailed images of your lungs.  Pulmonary function test. This test measures how much air your lungs can hold. It also tests how well air moves in and out of your lungs.  Electrocardiogram (ECG). This test traces the electrical activity of your heart.  Echocardiogram. This test is used to look at your heart  in motion and check how it is functioning.  Heart catheterization. This test can measure the pressure in your pulmonary artery and the right side of your heart.  Lung biopsy. This procedure involves checking a sample of lung tissue to find underlying causes.  How is this  treated? There is no cure for pulmonary hypertension, but treatment can help to relieve symptoms and slow the progress of the condition. Treatment can involve:  Medicines, such as: ? Blood pressure medicines. ? Medicines to relax (dilate) the pulmonary blood vessels. ? Water pills to get rid of extra fluid (diuretic medicines). ? Blood-thinning medicines.  Surgery. For severe pulmonary hypertension that does not respond to medical treatment, heart-lung or lung transplant may be needed.  Follow these instructions at home:  Take medicines only as directed by your health care provider. These include over-the-counter medicines and prescription medicines. Take all medicines exactly as instructed. Do not change or stop medicines without first checking with your health care provider.  Do not smoke. If you need help quitting, ask your health care provider.  Eat a healthy diet.  Limit your salt (sodium) intake to less than 2,300 mg per day.  Stay as active as possible. Exercise as directed by your health care provider. Talk with your health care provider about what type of exercise is safe for you.  Avoid high altitudes.  Avoid hot tubs and saunas.  Avoid becoming pregnant, if this applies. Talk with your health care provider about safe methods of birth control.  Keep all follow-up visits as directed by your health care provider. This is important. Get help right away if:  You have severe shortness of breath.  You develop chest pain or pressure in your chest.  You cough up blood.  You develop swelling of your feet or legs.  You have a significant increase in weight within 1-2 days. This information is not intended to replace advice given to you by your health care provider. Make sure you discuss any questions you have with your health care provider. Document Released: 03/01/2007 Document Revised: 11/22/2015 Document Reviewed: 10/24/2012 Elsevier Interactive Patient Education  2018  [employer].

## 2017-12-08 NOTE — Telephone Encounter (Signed)
Spoke with pt to get STAT CT scheduled. He said he could not go today, but could go tomorrow or Friday. He stated Dr. [name] wanted him to have scan a day after not taking his medication. I have scheduled pt for tomorrow, 12/09/17, at 1pm with [facility]. Pt stated this appt was fine and asked that I send him the information via MyChart. I also told pt he will need to pick up oral contrast today from a [facility] imaging center, and included this in my message as well. Thanks!

## 2017-12-08 NOTE — Progress Notes (Signed)
Subjective:    Patient ID: Brett Marshall, male    DOB: February 08, 1966, 52 y.o.   MRN: [id number]  12/08/2017  Chronic Conditions (6 month follow-up )    HPI This 52 y.o. male presents for evaluation of DMII, hypertension, chest pain, DDD lumbar spine.   Abdominal pain: occurring constantly throughout the day right before or right after meal time.  Waves of nausea are hitting.  Hit or miss thing.  Gets really gray when it happens.  Feels like gas.  If goes to have bowel movement, still there.  Sometimes will get relief.  LLQ pain and RLQ pain.  A lot of itmes mainly along LLQ.  Right now has RLQ pain.   No increase flatus.  No belching.  No dysuria, hematuria; always starts low.  Radiates to lower back on R.  Sometimes radiates to LEFT.  More with pain that without.  No bulges.  No radiation into genital area.  No increase in urination; same urination.  Not playing music as much anymore.  Not lifting any equipment except for horn.  No diarrhea; no constipation.  No bloody stools or black stools. No vomiting.    Lower back pain:  Cannot tell; having horribly back pain; may be related to abdominal pain.    Dizziness: intermittent; sitting down and standing with dizziness.  Feeling like going to fall over; not checking BP or sugar with these episodes.  Goes away after couple of minutes. Occurs daily; not all day but occurs daily.  Occurs with position changes.  Bending over and standing up.  Getting out of bed.   S/p CT abdomen/pelvis 08/2016 for this exact pain; negative.    HTN: Patient reports good compliance with medication, good tolerance to medication, and good symptom control.    CHF systolic: Referred to Dr. Fletcher Anon of cardiology after visit in April.  Underwent cardiac catheterization for definitive diagnosis.  Cardiac catheterization revealed mild diffuse stenosis with an ejection fraction of 45 to 50%, mild pulmonary hypertension.  Carotid Dopplers also obtained and negative for stenosis.   Carvedilol and Lasix added.  Enalapril continued.  Needs to treat OSA with CPAP.  Not checking sugars.  Taking Metformin, Glipzide.    Doing Cymbalta/Duloxetine 30mg  at bedtime; helping with feet burning.  Less burning.  L leg from leg to foot goes numb a lot.   BP Readings from Last 3 Encounters:  12/08/17 128/82  09/17/17 126/86  09/02/17 127/70   Wt Readings from Last 3 Encounters:  12/08/17 (!) 364 lb (165.1 kg)  09/17/17 (!) 371 lb 8 oz (168.5 kg)  09/02/17 (!) 371 lb (168.3 kg)   Immunization History  Administered Date(s) Administered  . Tdap 01/20/2011    Review of Systems  Constitutional: Negative for activity change, appetite change, chills, diaphoresis, fatigue, fever and unexpected weight change.  HENT: Negative for congestion, dental problem, drooling, ear discharge, ear pain, facial swelling, hearing loss, mouth sores, nosebleeds, postnasal drip, rhinorrhea, sinus pressure, sneezing, sore throat, tinnitus, trouble swallowing and voice change.   Eyes: Negative for photophobia, pain, discharge, redness, itching and visual disturbance.  Respiratory: Positive for shortness of breath. Negative for apnea, cough, choking, chest tightness, wheezing and stridor.   Cardiovascular: Positive for leg swelling. Negative for chest pain and palpitations.  Gastrointestinal: Positive for abdominal pain. Negative for abdominal distention, anal bleeding, blood in stool, constipation, diarrhea, nausea and vomiting.  Endocrine: Negative for cold intolerance, heat intolerance, polydipsia, polyphagia and polyuria.  Genitourinary: Negative for decreased urine  volume, difficulty urinating, discharge, dysuria, enuresis, flank pain, frequency, genital sores, hematuria, penile pain, penile swelling, scrotal swelling, testicular pain and urgency.  Musculoskeletal: Positive for back pain and gait problem. Negative for arthralgias, joint swelling, myalgias, neck pain and neck stiffness.  Skin: Negative  for color change, pallor, rash and wound.  Allergic/Immunologic: Negative for environmental allergies, food allergies and immunocompromised state.  Neurological: Negative for dizziness, tremors, seizures, syncope, facial asymmetry, speech difficulty, weakness, light-headedness, numbness and headaches.  Hematological: Negative for adenopathy. Does not bruise/bleed easily.  Psychiatric/Behavioral: Negative for agitation, behavioral problems, confusion, decreased concentration, dysphoric mood, hallucinations, self-injury, sleep disturbance and suicidal ideas. The patient is not nervous/anxious and is not hyperactive.     Past Medical History:  Diagnosis Date  . Asthma   . CHF (congestive heart failure) ([place])   . DDD (degenerative disc disease), lumbar   . Depression   . Diabetes mellitus without complication ([place])   . Essential hypertension, benign   . Leg swelling    Left  . Low back pain   . OSA on CPAP    non compliant   Past Surgical History:  Procedure Laterality Date  . CARDIAC CATHETERIZATION     [place] no stents.  Marland Kitchen RIGHT/LEFT HEART CATH AND CORONARY ANGIOGRAPHY N/A 09/02/2017   Procedure: RIGHT/LEFT HEART CATH AND CORONARY ANGIOGRAPHY;  Surgeon: Wellington Hampshire, MD;  Location: [facility];  Service: Cardiovascular;  Laterality: N/A;   No Known Allergies Current Outpatient Medications on File Prior to Visit  Medication Sig Dispense Refill  . albuterol (PROVENTIL HFA;VENTOLIN HFA) 108 (90 Base) MCG/ACT inhaler Inhale 1-2 puffs into the lungs every 6 (six) hours as needed for wheezing or shortness of breath. 1 Inhaler 0  . carvedilol (COREG) 6.25 MG tablet Take 1 tablet (6.25 mg total) by mouth 2 (two) times daily. 60 tablet 3  . cyclobenzaprine (FLEXERIL) 10 MG tablet Take 1 tablet (10 mg total) by mouth 3 (three) times daily as needed.For spasms 90 tablet 5  . DULoxetine (CYMBALTA) 30 MG capsule Take 1 capsule (30 mg total) by mouth daily. 90 capsule 1  .  enalapril (VASOTEC) 20 MG tablet Take 1 tablet (20 mg total) by mouth 2 (two) times daily. 60 tablet 5  . furosemide (LASIX) 20 MG tablet Take 1 tablet (20 mg total) by mouth daily. 90 tablet 3  . glucose blood (GE100 BLOOD GLUCOSE TEST) test strip Use as instructed 100 each 12  . Lancets (ONETOUCH ULTRASOFT) lancets Check sugar twice daily 100 each 12  . meloxicam (MOBIC) 15 MG tablet Take 1 tablet (15 mg total) by mouth daily. 30 tablet 0   No current facility-administered medications on file prior to visit.    Social History   Socioeconomic History  . Marital status: Married    Spouse name: Not on file  . Number of children: Not on file  . Years of education: Not on file  . Highest education level: Not on file  Occupational History  . Occupation: Chemical engineer: [employer]  Social Needs  . Financial resource strain: Not on file  . Food insecurity:    Worry: Not on file    Inability: Not on file  . Transportation needs:    Medical: Not on file    Non-medical: Not on file  Tobacco Use  . Smoking status: Never Smoker  . Smokeless tobacco: Never Used  Substance and Sexual Activity  . Alcohol use: Yes    Comment:  few times per year  . Drug use: No  . Sexual activity: Yes  Lifestyle  . Physical activity:    Days per week: Not on file    Minutes per session: Not on file  . Stress: Not on file  Relationships  . Social connections:    Talks on phone: Not on file    Gets together: Not on file    Attends religious service: Not on file    Active member of club or organization: Not on file    Attends meetings of clubs or organizations: Not on file    Relationship status: Not on file  . Intimate partner violence:    Fear of current or ex partner: Not on file    Emotionally abused: Not on file    Physically abused: Not on file    Forced sexual activity: Not on file  Other Topics Concern  . Not on file  Social History Narrative   Goes to gym.   Family History    Problem Relation Age of Onset  . Diabetes Mother   . Asthma Mother   . Heart disease Father   . Heart attack Father   . Sjogren's syndrome Sister   . Heart disease Brother 18       CAD/CABG       Objective:    BP 128/82   Pulse (!) 104   Temp 98.8 F (37.1 C) (Oral)   Resp 16   Ht 5' 9.29" (1.76 m)   Wt (!) 364 lb (165.1 kg)   SpO2 98%   BMI 53.30 kg/m  Physical Exam  Constitutional: He is oriented to person, place, and time. He appears well-developed and well-nourished. No distress.  HENT:  Head: Normocephalic and atraumatic.  Right Ear: External ear normal.  Left Ear: External ear normal.  Nose: Nose normal.  Mouth/Throat: Oropharynx is clear and moist.  Eyes: Pupils are equal, round, and reactive to light. Conjunctivae and EOM are normal.  Neck: Normal range of motion. Neck supple. Carotid bruit is not present. No thyromegaly present.  Cardiovascular: Normal rate, regular rhythm, normal heart sounds and intact distal pulses. Exam reveals no gallop and no friction rub.  No murmur heard. 1+ pitting edema B lower extremities to midcalves.  Pulmonary/Chest: Effort normal and breath sounds normal. He has no wheezes. He has no rales.  Abdominal: Soft. Bowel sounds are normal. He exhibits no distension and no mass. There is tenderness in the right lower quadrant, epigastric area and left lower quadrant. There is no rebound and no guarding.  Musculoskeletal: He exhibits edema.       Right shoulder: Normal.       Left shoulder: Normal.       Cervical back: Normal.  Lymphadenopathy:    He has no cervical adenopathy.  Neurological: He is alert and oriented to person, place, and time. He has normal reflexes. No cranial nerve deficit. He exhibits normal muscle tone. Coordination normal.  Skin: Skin is warm and dry. No rash noted. He is not diaphoretic.  Psychiatric: He has a normal mood and affect. His behavior is normal. Judgment and thought content normal.   Ct Abdomen  Pelvis W Contrast  Result Date: 12/09/2017 CLINICAL DATA:  Pt stated he has been having RLQ pain to mid abdomen pain.Pt states the pain is off and on.Pt stated he had heart cathter placed about month ago.PT stated he has been feeling nausea lately. Pt states no hx of surgeries on abdomen area. EXAM:  CT ABDOMEN AND PELVIS WITH CONTRAST TECHNIQUE: Multidetector CT imaging of the abdomen and pelvis was performed using the standard protocol following bolus administration of intravenous contrast. CONTRAST:  114mL ISOVUE-370 IOPAMIDOL (ISOVUE-370) INJECTION 76% COMPARISON:  CT, 08/24/2016 FINDINGS: Lower chest: No acute abnormality. Hepatobiliary: Decreased attenuation of the liver consistent with fatty infiltration. Liver normal size. No mass or focal lesion. Normal gallbladder. No bile duct dilation Pancreas: Unremarkable. No pancreatic ductal dilatation or surrounding inflammatory changes. Spleen: Normal in size without focal abnormality. Adrenals/Urinary Tract: Adrenal glands are unremarkable. Kidneys are normal, without renal calculi, focal lesion, or hydronephrosis. Bladder is unremarkable. Stomach/Bowel: Stomach and small bowel unremarkable. Colon is normal caliber. There small scattered diverticula. No diverticulitis or other colonic inflammatory process. Normal appendix visualized. Vascular/Lymphatic: Prominent gastrohepatic and peri celiac lymph nodes, largest a posterior gastrohepatic node measuring 15 mm in short axis. These are stable from prior CT consistent reactive nodes. No other adenopathy. No vascular abnormality. Reproductive: Unremarkable Other: No abdominal wall hernia or abnormality. No abdominopelvic ascites. Musculoskeletal: No fracture or acute finding. No osteoblastic or osteolytic lesions. IMPRESSION: 1. No acute findings. 2. Hepatic steatosis, which was present on the prior CT. 3. Mild reactive gastrohepatic and peri celiac adenopathy. 4. Scattered colonic diverticula.  No diverticulitis.  Electronically Signed   By: Lajean Manes M.D.   On: 12/09/2017 10:16   Depression screen [facility] 2/9 12/08/2017 08/23/2017 05/24/2017 02/15/2017 11/10/2016  Decreased Interest 0 0 0 0 0  Down, Depressed, Hopeless 0 0 0 0 0  PHQ - 2 Score 0 0 0 0 0   Fall Risk  12/08/2017 08/23/2017 05/24/2017 02/15/2017 11/10/2016  Falls in the past year? No No No No No  Comment - - - - -  Number falls in past yr: - - - - -  Injury with Fall? - - - - -  Comment - - - - -   No data found.       Assessment & Plan:   1. Essential hypertension   2. Type 2 diabetes mellitus without complication, without long-term current use of insulin ([place])   3. DDD (degenerative disc disease), lumbar   4. Dizziness   5. Generalized abdominal pain   6. Colon cancer screening   7. Morbid obesity ([place])   8. NASH (nonalcoholic steatohepatitis)   9. Chronic systolic congestive heart failure ([place])   10. Pulmonary hypertension (H[facility])     Abdominal pain generalized: New onset.  Obtain labs.  Obtain CT abdomen and pelvis.  Refer to GI for colonoscopy and further evaluation.  Recommend Zantac 150 milligrams twice daily to treat any underlying gastritis or peptic ulcer disease.  Dizziness: New onset.  Associated with changes in position thus highly suggestive of dehydration versus orthostatic hypotension.  Recommend checking blood pressure daily at home.  Systolic heart failure pulmonary hypertension: [place] diagnosed during cardiac catheterization for chest pain.  Status post cardiology consultation.  Carvedilol and furosemide therapies have been added.  Needs treatment of obstructive sleep apnea.  Diabetes mellitus type 2: Well-controlled at this time.  Obtain labs.  Continue current medications.  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   Orders Placed This Encounter  Procedures  . CT Abdomen Pelvis W Contrast    Patient  can leave after scan per provider    Standing Status:   Future    Number of Occurrences:   1    Standing  Expiration Date:   03/11/2019    Order Specific Question:   If indicated for the ordered procedure, I authorize the administration of contrast media per Radiology protocol    Answer:   Yes    Order Specific Question:   Preferred imaging location?    Answer:   [facility]    Order Specific Question:   Is Oral Contrast requested for this exam?    Answer:   Yes, Per Radiology protocol    Order Specific Question:   Call Results- Best Contact Number?    Answer:   [phone]    Order Specific Question:   Radiology Contrast Protocol - do NOT remove file path    Answer:   [email or web address]  . Comprehensive metabolic panel    Order Specific Question:   Has the patient fasted?    Answer:   No  . Lipid panel    Order Specific Question:   Has the patient fasted?    Answer:   No  . CBC with Differential/Platelet  . Urinalysis, dipstick only  . Ambulatory referral to Gastroenterology    Referral Priority:   Routine    Referral Type:   Consultation    Referral Reason:   Specialty Services Required    Referred to Provider:   [name], MD    Number of Visits Requested:   1  . Orthostatic vital signs  . POCT glucose (manual entry)  . POCT glycosylated hemoglobin (Hb A1C)  . POCT urinalysis dipstick   Meds ordered this encounter  Medications  . glipiZIDE (GLUCOTROL) 5 MG tablet    Sig: Take 1 tablet (5 mg total) by mouth 2 (two) times daily before a meal.    Dispense:  180 tablet    Refill:  1  . metFORMIN (GLUCOPHAGE) 1000 MG tablet    Sig: Take 1 tablet (1,000 mg total) by mouth 2 (two) times daily with a meal.    Dispense:  180 tablet    Refill:  1  . HYDROcodone-acetaminophen (NORCO/VICODIN) 5-325 MG tablet    Sig: Take 1-2 tablets by mouth every 6 (six) hours as needed for moderate pain.    Dispense:  60 tablet    Refill:  0    Return  in about 3 months (around 03/10/2018) for follow-up chronic medical conditions [place].   Jaspreet Bodner Elayne Guerin, M.D. Primary Care at [facility] previously [facility] [address] [place], [place]  [address] [phone] phone [phone] fax

## 2017-12-09 ENCOUNTER — Encounter: Payer: Self-pay | Admitting: Family Medicine

## 2017-12-09 ENCOUNTER — Ambulatory Visit
Admission: RE | Admit: 2017-12-09 | Discharge: 2017-12-09 | Disposition: A | Discharge status: Home / Self Care | Payer: Self-pay | Source: Ambulatory Visit | Attending: Family Medicine | Admitting: Family Medicine

## 2017-12-09 ENCOUNTER — Ambulatory Visit: Admission: RE | Admit: 2017-12-09 | Payer: Self-pay | Source: Ambulatory Visit

## 2017-12-09 DIAGNOSIS — R1084 Generalized abdominal pain: Secondary | ICD-10-CM | POA: Insufficient documentation

## 2017-12-09 DIAGNOSIS — K760 Fatty (change of) liver, not elsewhere classified: Secondary | ICD-10-CM | POA: Insufficient documentation

## 2017-12-09 DIAGNOSIS — I5022 Chronic systolic (congestive) heart failure: Secondary | ICD-10-CM | POA: Insufficient documentation

## 2017-12-09 DIAGNOSIS — I272 Pulmonary hypertension, unspecified: Secondary | ICD-10-CM | POA: Insufficient documentation

## 2017-12-09 LAB — POCT I-STAT CREATININE: Creatinine, Ser: 1.1 mg/dL (ref 0.61–1.24)

## 2017-12-09 MED ORDER — METFORMIN HCL 1000 MG PO TABS: 1000.0000 mg | ORAL_TABLET | Freq: Two times a day (BID) | ORAL | 1 refills | Status: AC

## 2017-12-09 MED ORDER — IOPAMIDOL (ISOVUE-370) INJECTION 76%
100.0000 mL | Freq: Once | INTRAVENOUS | Status: AC | PRN
  Administered 2017-12-09: 100 mL via INTRAVENOUS

## 2017-12-09 MED ORDER — GLIPIZIDE 5 MG PO TABS: 5.0000 mg | ORAL_TABLET | Freq: Two times a day (BID) | ORAL | 1 refills | Status: DC

## 2017-12-09 MED ORDER — HYDROCODONE-ACETAMINOPHEN 5-325 MG PO TABS: 1.0000 | ORAL_TABLET | Freq: Four times a day (QID) | ORAL | 0 refills | Status: DC | PRN

## 2017-12-24 ENCOUNTER — Encounter: Payer: Self-pay | Admitting: Cardiovascular Disease

## 2017-12-24 ENCOUNTER — Ambulatory Visit: Payer: Self-pay | Admitting: Cardiovascular Disease

## 2017-12-24 VITALS — BP 120/80 | HR 88 | Ht 68.0 in | Wt 371.8 lb

## 2017-12-24 DIAGNOSIS — G4730 Sleep apnea, unspecified: Secondary | ICD-10-CM

## 2017-12-24 DIAGNOSIS — I5022 Chronic systolic (congestive) heart failure: Secondary | ICD-10-CM

## 2017-12-24 DIAGNOSIS — E785 Hyperlipidemia, unspecified: Secondary | ICD-10-CM

## 2017-12-24 DIAGNOSIS — R0989 Other specified symptoms and signs involving the circulatory and respiratory systems: Secondary | ICD-10-CM

## 2017-12-24 DIAGNOSIS — I10 Essential (primary) hypertension: Secondary | ICD-10-CM

## 2017-12-24 NOTE — Progress Notes (Signed)
Cardiology Office Note   Date:  12/24/2017   ID:  Brett Marshall, DOB 1966-05-05, MRN [id number]  PCP:  [name], MD  Cardiologist:   [name], MD   Chief Complaint  Patient presents with  . other    3 month follow up. Meds reviewed by the pt. verbally. Pt. c/o chest pain, LE edema with more on the left and shortness of breath. Pt. will need a colonoscopy but not scheduled yet.      History of Present Illness: Brett Marshall is a 52 y.o. male who is here today for a follow-up visit regarding chronic systolic heart failure due to nonischemic cardiomyopathy.   The patient has multiple chronic medical conditions that include type 2 diabetes, essential hypertension, morbid obesity and degenerative disc disease. He also has sleep apnea but currently not on CPAP as a machine he has is very old.  Right and left cardiac catheterization done in April of this year showed mild nonobstructive coronary artery disease, mildly reduced LV systolic function with an EF of 45 to 50% with global hypokinesis.  Right heart catheterization showed moderately elevated filling pressures with a wedge pressure of 22 mmHg, mild pulmonary hypertension and high cardiac output.  The dose of furosemide was decreased to 20 mg daily after his labs showed slight volume depletion.  He has been doing well with no worsening shortness of breath.  He continues to complain of intermittent substernal chest discomfort but not as much as before. He continues to have apnea episodes noted by his wife at night.   Past Medical History:  Diagnosis Date  . Asthma   . CHF (congestive heart failure) ([place])   . DDD (degenerative disc disease), lumbar   . Depression   . Diabetes mellitus without complication ([place])   . Essential hypertension, benign   . Leg swelling    Left  . Low back pain   . OSA on CPAP    non compliant    Past Surgical History:  Procedure Laterality Date  . CARDIAC CATHETERIZATION     [facility] no stents.  [facility] Kitchen RIGHT/LEFT HEART CATH AND CORONARY ANGIOGRAPHY N/A 09/02/2017   Procedure: RIGHT/LEFT HEART CATH AND CORONARY ANGIOGRAPHY;  Surgeon: Wellington Hampshire, MD;  Location: [place];  Service: Cardiovascular;  Laterality: N/A;     Current Outpatient Medications  Medication Sig Dispense Refill  . albuterol (PROVENTIL HFA;VENTOLIN HFA) 108 (90 Base) MCG/ACT inhaler Inhale 1-2 puffs into the lungs every 6 (six) hours as needed for wheezing or shortness of breath. 1 Inhaler 0  . carvedilol (COREG) 6.25 MG tablet Take 1 tablet (6.25 mg total) by mouth 2 (two) times daily. 60 tablet 3  . cyclobenzaprine (FLEXERIL) 10 MG tablet Take 1 tablet (10 mg total) by mouth 3 (three) times daily as needed.For spasms 90 tablet 5  . DULoxetine (CYMBALTA) 30 MG capsule Take 1 capsule (30 mg total) by mouth daily. 90 capsule 1  . enalapril (VASOTEC) 20 MG tablet Take 1 tablet (20 mg total) by mouth 2 (two) times daily. 60 tablet 5  . furosemide (LASIX) 20 MG tablet Take 1 tablet (20 mg total) by mouth daily. 90 tablet 3  . glipiZIDE (GLUCOTROL) 5 MG tablet Take 1 tablet (5 mg total) by mouth 2 (two) times daily before a meal. 180 tablet 1  . glucose blood (GE100 BLOOD GLUCOSE TEST) test strip Use as instructed 100 each 12  . HYDROcodone-acetaminophen (NORCO/VICODIN) 5-325 MG tablet  Take 1-2 tablets by mouth every 6 (six) hours as needed for moderate pain. 60 tablet 0  . Lancets (ONETOUCH ULTRASOFT) lancets Check sugar twice daily 100 each 12  . metFORMIN (GLUCOPHAGE) 1000 MG tablet Take 1 tablet (1,000 mg total) by mouth 2 (two) times daily with a meal. 180 tablet 1  . ranitidine (ZANTAC) 150 MG capsule Take 150 mg by mouth 2 (two) times daily.     No current facility-administered medications for this visit.     Allergies:   Patient has no known allergies.    Social History:  The patient  reports that he has never smoked. He has never used smokeless tobacco. He reports that he drinks  alcohol. He reports that he does not use drugs.   Family History:  The patient's family history includes Asthma in his mother; Diabetes in his mother; Heart attack in his father; Heart disease in his father; Heart disease (age of onset: 64) in his brother; Sjogren's syndrome in his sister.    ROS:  Please see the history of present illness.   Otherwise, review of systems are positive for none.   All other systems are reviewed and negative.   PHYSICAL EXAM: VS:  BP 120/80 (BP Location: Right Arm, Patient Position: Sitting, Cuff Size: Large)   Pulse 88   Ht 5' 8"  (1.727 m)   Wt (!) 371 lb 12 oz (168.6 kg)   BMI 56.52 kg/m  , BMI Body mass index is 56.52 kg/m. GEN: Well nourished, well developed, in no acute distress  HEENT: normal  Neck: no JVD or masses.  Bilateral carotid bruits. Cardiac: RRR; norubs, or gallops.  1/ systolic murmur at the aortic area.  Mild bilateral leg edema. Respiratory:  clear to auscultation bilaterally, normal work of breathing GI: soft, nontender, nondistended, + BS MS: no deformity or atrophy  Skin: warm and dry, no rash Neuro:  Strength and sensation are intact Psych: euthymic mood, full affect   EKG:  EKG is ordered today. The ekg ordered today demonstrates normal sinus rhythm with low voltage and no significant ST or T wave changes.   Recent Labs: 12/08/2017: ALT 32; BUN 22; Hemoglobin 13.4; Platelets 249; Potassium 4.6; Sodium 139 12/09/2017: Creatinine, Ser 1.10    Lipid Panel    Component Value Date/Time   CHOL 176 12/08/2017 1150   TRIG 143 12/08/2017 1150   HDL 44 12/08/2017 1150   CHOLHDL 4.0 12/08/2017 1150   CHOLHDL 3.5 01/11/2016 1008   VLDL 17 01/11/2016 1008   LDLCALC 103 (H) 12/08/2017 1150      Wt Readings from Last 3 Encounters:  12/24/17 (!) 371 lb 12 oz (168.6 kg)  12/08/17 (!) 364 lb (165.1 kg)  09/17/17 (!) 371 lb 8 oz (168.5 kg)     PAD Screen 08/27/2017  Previous PAD dx? No  Previous surgical procedure? No  Pain  with walking? Yes  Subsides with rest? No  Feet/toe relief with dangling? Yes  Painful, non-healing ulcers? No  Extremities discolored? Yes      ASSESSMENT AND PLAN:  1.  Chronic systolic heart failure with mildly reduced LV systolic function due to nonischemic cardiomyopathy: EF of 45 to 50%.   He appears to be euvolemic on small dose furosemide. Continue current dose of carvedilol and enalapril.  2.  Bilateral carotid bruits: Carotid Doppler showed normal carotid arteries.  3.  Essential hypertension: Blood pressure is controlled on current medications  4.  Borderline hyperlipidemia: Most recent LDL was 103.  I discussed with him the importance of healthy diet, exercise and weight loss  5.  Sleep apnea: Currently not using CPAP given that the machine seems to be very old and he did not tolerate that in the past.  I do suspect that this is contributing to his heart failure.  I am referring him to pulmonary for evaluation and management.   Disposition:   FU with me in 6 month  Signed,  [name], MD  12/24/2017 10:43 AM    [place]

## 2017-12-24 NOTE — Patient Instructions (Addendum)
Medication Instructions: Your physician recommends that you continue on your current medications as directed. Please refer to the Current Medication list given to you today.  If you need a refill on your cardiac medications before your next appointment, please call your pharmacy.   Follow-Up: Your physician wants you to follow-up in 6 months with Dr. Fletcher Anon. You will receive a reminder letter in the mail two months in advance. If you don't receive a letter, please call our office at [phone] to schedule this follow-up appointment.   Special Instructions: A referral has been placed to the pulmonologist.    Thank you for choosing Heartcare at [facility]!

## 2017-12-30 ENCOUNTER — Ambulatory Visit (INDEPENDENT_AMBULATORY_CARE_PROVIDER_SITE_OTHER): Payer: Self-pay | Admitting: Internal Medicine

## 2017-12-30 ENCOUNTER — Encounter: Payer: Self-pay | Admitting: Internal Medicine

## 2017-12-30 VITALS — BP 102/70 | HR 99 | Resp 16 | Ht 68.0 in | Wt 370.0 lb

## 2017-12-30 DIAGNOSIS — G4719 Other hypersomnia: Secondary | ICD-10-CM

## 2017-12-30 NOTE — Progress Notes (Signed)
[facility] Consultation      Assessment and Plan:  Obstructive sleep apnea.  -Previous diagnosis of sleep apnea with continued symptoms of excessive daytime sleepiness. - Patient does not have insurance, therefore will prefer a home sleep test over an in lab sleep test given that he is going to pay out-of-pocket and the cost of a in lab study will be prohibitive for him.  Morbid obesity.  -Discussed that weight loss would be beneficial.  Congestive heart failure.  -May be contributed to by obstructive sleep apnea, will test for sleep apnea and started on CPAP as indicated.   Date: 12/30/2017  MRN# [id number] SHIGERU LAMPERT 20-Jun-1965   Brett Marshall is a 52 y.o. old male seen in consultation for chief complaint of:    Chief Complaint  Patient presents with  . Consult    Referred by Dr. Fletcher Anon:For evaluation and management of sleep apnea    HPI:   He was diagnosed with OSA more than 10 years ago, he was started on CPAP. He used a full mask because of mouth breathing, it was not fitting well so he stopped. He used it for a few weeks before stopping.  He is sleepy during the day, he can fall asleep easily at home while working on his computer.  He denies jaw problem no TMJ. No cataplexy.  He has been diagnosed with CHF  With nonischemic cardiomyopathy, and pulm hypertension. EF of 45%. He goes to bed around 10 to 11 pm, out of bed around 730 am. When wakes does not feel rested. ON weekends he wakes at 930 does not really feel better.   Left and right heart catheterization 09/02/2017>> EF 45%, wedge pressure of 22, pulmonary artery mean pressure 23.  PMHX:   Past Medical History:  Diagnosis Date  . Asthma   . CHF (congestive heart failure) ([place])   . DDD (degenerative disc disease), lumbar   . Depression   . Diabetes mellitus without complication ([place])   . Essential hypertension, benign   . Leg swelling    Left  . Low back pain   . OSA on CPAP    non  compliant   Surgical Hx:  Past Surgical History:  Procedure Laterality Date  . CARDIAC CATHETERIZATION     [place] no stents.  [facility] RIGHT/LEFT HEART CATH AND CORONARY ANGIOGRAPHY N/A 09/02/2017   Procedure: RIGHT/LEFT HEART CATH AND CORONARY ANGIOGRAPHY;  Surgeon: Wellington Hampshire, MD;  Location: [facility];  Service: Cardiovascular;  Laterality: N/A;   Family Hx:  Family History  Problem Relation Age of Onset  . Diabetes Mother   . Asthma Mother   . Heart disease Father   . Heart attack Father   . Sjogren's syndrome Sister   . Heart disease Brother 81       CAD/CABG   Social Hx:   Social History   Tobacco Use  . Smoking status: Never Smoker  . Smokeless tobacco: Never Used  Substance Use Topics  . Alcohol use: Yes    Comment: few times per year  . Drug use: No   Medication:    Current Outpatient Medications:  .  albuterol (PROVENTIL HFA;VENTOLIN HFA) 108 (90 Base) MCG/ACT inhaler, Inhale 1-2 puffs into the lungs every 6 (six) hours as needed for wheezing or shortness of breath., Disp: 1 Inhaler, Rfl: 0 .  carvedilol (COREG) 6.25 MG tablet, Take 1 tablet (6.25 mg total) by mouth 2 (two) times daily., Disp:  60 tablet, Rfl: 3 .  cyclobenzaprine (FLEXERIL) 10 MG tablet, Take 1 tablet (10 mg total) by mouth 3 (three) times daily as needed.For spasms, Disp: 90 tablet, Rfl: 5 .  DULoxetine (CYMBALTA) 30 MG capsule, Take 1 capsule (30 mg total) by mouth daily., Disp: 90 capsule, Rfl: 1 .  enalapril (VASOTEC) 20 MG tablet, Take 1 tablet (20 mg total) by mouth 2 (two) times daily., Disp: 60 tablet, Rfl: 5 .  furosemide (LASIX) 20 MG tablet, Take 1 tablet (20 mg total) by mouth daily., Disp: 90 tablet, Rfl: 3 .  glipiZIDE (GLUCOTROL) 5 MG tablet, Take 1 tablet (5 mg total) by mouth 2 (two) times daily before a meal., Disp: 180 tablet, Rfl: 1 .  glucose blood (GE100 BLOOD GLUCOSE TEST) test strip, Use as instructed, Disp: 100 each, Rfl: 12 .  HYDROcodone-acetaminophen  (NORCO/VICODIN) 5-325 MG tablet, Take 1-2 tablets by mouth every 6 (six) hours as needed for moderate pain., Disp: 60 tablet, Rfl: 0 .  Lancets (ONETOUCH ULTRASOFT) lancets, Check sugar twice daily, Disp: 100 each, Rfl: 12 .  metFORMIN (GLUCOPHAGE) 1000 MG tablet, Take 1 tablet (1,000 mg total) by mouth 2 (two) times daily with a meal., Disp: 180 tablet, Rfl: 1 .  ranitidine (ZANTAC) 150 MG capsule, Take 150 mg by mouth 2 (two) times daily., Disp: , Rfl:    Allergies:  Patient has no known allergies.  Review of Systems: Gen:  Denies  fever, sweats, chills HEENT: Denies blurred vision, double vision. bleeds, sore throat Cvc:  No dizziness, chest pain. Resp:   Denies cough or sputum production, shortness of breath Gi: Denies swallowing difficulty, stomach pain. Gu:  Denies bladder incontinence, burning urine Ext:   No Joint pain, stiffness. Skin: No skin rash,  hives  Endoc:  No polyuria, polydipsia. Psych: No depression, insomnia. Other:  All other systems were reviewed with the patient and were negative other that what is mentioned in the HPI.   Physical Examination:   VS: BP 102/70 (BP Location: Left Arm, Cuff Size: Large)   Pulse 99   Resp 16   Ht 5\' 8"  (1.727 m)   Wt (!) 370 lb (167.8 kg)   SpO2 95%   BMI 56.26 kg/m   General Appearance: No distress, vomiting obese. Neuro:without focal findings,  speech normal,  HEENT: PERRLA, EOM intact.  Mallampati 3. Pulmonary: normal breath sounds, No wheezing.  CardiovascularNormal S1,S2.  No m/r/g.   Abdomen: Benign, Soft, non-tender. Renal:  No costovertebral tenderness  GU:  No performed at this time. Endoc: No evident thyromegaly, no signs of acromegaly. Skin:   warm, no rashes, no ecchymosis  Extremities: normal, no cyanosis, clubbing.  Other findings:    LABORATORY PANEL:   CBC No results for input(s): WBC, HGB, HCT, PLT in the last 168  hours. ------------------------------------------------------------------------------------------------------------------  Chemistries  No results for input(s): NA, K, CL, CO2, GLUCOSE, BUN, CREATININE, CALCIUM, MG, AST, ALT, ALKPHOS, BILITOT in the last 168 hours.  Invalid input(s): GFRCGP ------------------------------------------------------------------------------------------------------------------  Cardiac Enzymes No results for input(s): TROPONINI in the last 168 hours. ------------------------------------------------------------  RADIOLOGY:  No results found.     Thank  you for the consultation and for allowing [facility] to assist in the care of your patient. Our recommendations are noted above.  Please contact us if we can be of further service.   Marda Stalker, M.D., F.C.C.P.  Board Certified in Internal Medicine, Pulmonary Medicine, [place], and Sleep Medicine.  [facility] Office Number: [phone]   12/30/2017

## 2017-12-30 NOTE — Patient Instructions (Addendum)

## 2018-01-03 ENCOUNTER — Telehealth: Payer: Self-pay | Admitting: *Deleted

## 2018-01-03 DIAGNOSIS — G4733 Obstructive sleep apnea (adult) (pediatric): Secondary | ICD-10-CM

## 2018-01-03 NOTE — Telephone Encounter (Signed)
Pt aware of results. Orders placed  Nothing further needed. 

## 2018-01-12 ENCOUNTER — Telehealth: Payer: Self-pay | Admitting: Internal Medicine

## 2018-01-12 NOTE — Telephone Encounter (Signed)
Patient calling stating he has an old CPAP machine and wanted to know if we could possibly use this for he does not have insurance   He has a Resmed series 8    Please advise

## 2018-01-12 NOTE — Telephone Encounter (Signed)
Spoke with pt and informed him no DME will take care of the machine or place the settings. Pt verbalized understanding. Nothing further needed.

## 2018-01-18 NOTE — Telephone Encounter (Signed)
Patient calling to check status of cpap assistance application form .  He was told that it could be faxed and rhonda was helping him.  Please call to discuss.

## 2018-01-18 NOTE — Telephone Encounter (Signed)
Pt advised that Dr. Ashby Dawes was out of the office on Thurs and Friday.  Dr. Ashby Dawes signed forms and forms were faxed and received (by fax confirmation) to CPAP Assistance program.  Pt called and advised of the above. Rhonda J Cobb

## 2018-01-22 ENCOUNTER — Other Ambulatory Visit: Payer: Self-pay | Admitting: Cardiovascular Disease

## 2018-01-26 ENCOUNTER — Encounter: Payer: Self-pay | Admitting: Gastroenterology

## 2018-01-26 ENCOUNTER — Ambulatory Visit: Payer: Self-pay | Admitting: Gastroenterology

## 2018-01-26 VITALS — BP 120/64 | HR 88 | Ht 68.0 in | Wt 375.5 lb

## 2018-01-26 DIAGNOSIS — R1084 Generalized abdominal pain: Secondary | ICD-10-CM

## 2018-01-26 NOTE — Progress Notes (Signed)
Gastroenterology Consultation  Referring Provider:     [name], MD Primary Care Physician:  [name], MD Primary Gastroenterologist:  Dr. Allen Norris     Reason for Consultation:     Abdominal pain        HPI:   Brett Marshall is a 52 y.o. y/o male referred for consultation & management of abdominal pain by Dr. [name], Renette Butters, MD.  This patient comes today after being seen by his primary care provider back in July for abdominal pain.  The patient states that he has the pain consistently usually after meals.  He also reports that these pains are associated with nausea.  The patient reports that he does not have any exacerbation or relief with bowel movements.  The patient also has reported back pain that he is not sure if it is associated with the abdominal pain.  The pain has been both on the right and left lower quadrants.  If he was to gauge which area hurts the most he reports more on the left lower quadrant.  Is no report of any unexplained weight loss nausea vomiting fevers chills black stools or bloody stools.  There is also no change in bowel habits. The patient has been evaluated by Dr. Fletcher Anon due to congestive heart failure and fraction around 40 to 50%.  The patient had a CT scan of the abdomen for this pain which was negative except for fatty liver and some lymphadenopathy it had been stable since the previous CT scan. The patient's pain is a stabbing pain and not crampy.  Past Medical History:  Diagnosis Date  . Asthma   . CHF (congestive heart failure) ([place])   . DDD (degenerative disc disease), lumbar   . Depression   . Diabetes mellitus without complication ([place])   . Essential hypertension, benign   . Leg swelling    Left  . Low back pain   . OSA on CPAP    non compliant    Past Surgical History:  Procedure Laterality Date  . CARDIAC CATHETERIZATION     [place] no stents.  [facility] RIGHT/LEFT HEART CATH AND CORONARY ANGIOGRAPHY N/A 09/02/2017   Procedure:  RIGHT/LEFT HEART CATH AND CORONARY ANGIOGRAPHY;  Surgeon: Wellington Hampshire, MD;  Location: [facility];  Service: Cardiovascular;  Laterality: N/A;    Prior to Admission medications   Medication Sig Start Date End Date Taking? Authorizing Provider  albuterol (PROVENTIL HFA;VENTOLIN HFA) 108 (90 Base) MCG/ACT inhaler Inhale 1-2 puffs into the lungs every 6 (six) hours as needed for wheezing or shortness of breath. 10/20/16   Norval Gable, MD  carvedilol (COREG) 6.25 MG tablet Take 1 tablet (6.25 mg total) by mouth 2 (two) times daily. 01/24/18 01/24/19  Wellington Hampshire, MD  cyclobenzaprine (FLEXERIL) 10 MG tablet Take 1 tablet (10 mg total) by mouth 3 (three) times daily as needed.For spasms 11/20/17   [name], MD  DULoxetine (CYMBALTA) 30 MG capsule Take 1 capsule (30 mg total) by mouth daily. 08/23/17   [name], MD  enalapril (VASOTEC) 20 MG tablet Take 1 tablet (20 mg total) by mouth 2 (two) times daily. 11/05/17   [name], MD  furosemide (LASIX) 20 MG tablet Take 1 tablet (20 mg total) by mouth daily. 09/17/17   Wellington Hampshire, MD  glipiZIDE (GLUCOTROL) 5 MG tablet Take 1 tablet (5 mg total) by mouth 2 (two) times daily before a meal. 12/09/17  [name], MD  glucose blood (GE100 BLOOD GLUCOSE TEST) test strip Use as instructed 02/03/16   [name], MD  HYDROcodone-acetaminophen (NORCO/VICODIN) 5-325 MG tablet Take 1-2 tablets by mouth every 6 (six) hours as needed for moderate pain. 12/09/17   [name], MD  Lancets [facility] ULTRASOFT) lancets Check sugar twice daily 01/11/16   [name], MD  metFORMIN (GLUCOPHAGE) 1000 MG tablet Take 1 tablet (1,000 mg total) by mouth 2 (two) times daily with a meal. 12/09/17   [name], MD  ranitidine (ZANTAC) 150 MG capsule Take 150 mg by mouth 2 (two) times daily.    [provider]    Family History  Problem Relation Age of Onset  . Diabetes Mother   . Asthma Mother   . Heart disease  Father   . Heart attack Father   . Sjogren's syndrome Sister   . Heart disease Brother 26       CAD/CABG     Social History   Tobacco Use  . Smoking status: Never Smoker  . Smokeless tobacco: Never Used  Substance Use Topics  . Alcohol use: Yes    Comment: few times per year  . Drug use: No    Allergies as of 01/26/2018  . (No Known Allergies)    Review of Systems:    All systems reviewed and negative except where noted in HPI.   Physical Exam:  There were no vitals taken for this visit. No LMP for male patient. General:   Alert, morbidly obese, well-nourished, pleasant and cooperative in NAD Head:  Normocephalic and atraumatic. Eyes:  Sclera clear, no icterus.   Conjunctiva pink. Ears:  Normal auditory acuity. Nose:  No deformity, discharge, or lesions. Mouth:  No deformity or lesions,oropharynx pink & moist. Neck:  Supple; no masses or thyromegaly. Lungs:  Respirations even and unlabored.  Clear throughout to auscultation.   No wheezes, crackles, or rhonchi. No acute distress. Heart:  Regular rate and rhythm; no murmurs, clicks, rubs, or gallops. Abdomen:  Normal bowel sounds.  No bruits.  Soft, non-tender and non-distended without masses, hepatosplenomegaly or hernias noted.  No guarding or rebound tenderness.  Positive Carnett sign with reproducible pain by flexing the abdominal wall muscles.   Rectal:  Deferred.  Msk:  Symmetrical without gross deformities.  Good, equal movement & strength bilaterally. Pulses:  Normal pulses noted. Extremities:  No clubbing or edema.  No cyanosis. Neurologic:  Alert and oriented x3;  grossly normal neurologically. Skin:  Intact without significant lesions or rashes.  No jaundice. Lymph Nodes:  No significant cervical adenopathy. Psych:  Alert and cooperative. Normal mood and affect.  Imaging Studies: No results found.  Assessment and Plan:   Brett Marshall is a 52 y.o. y/o male with a long history of back pain who reports  having abdominal pain typically on the same side that he is suffering from back pain at that time.  The patient denies any unexplained weight loss but has been on diet and lost 80 pounds.  The patient also denies any change in bowel habits.  There is no nausea or vomiting at the present time.  The patient has been told that his pain is consistent with musculoskeletal pain and has been told that this is more common in people who have back pain.  Patient has been told to use warm compresses, continue his Flexeril and use anti-inflammatory medication with food for his muscular pain.  The patient has also  not had a screening colonoscopy and will be set up for a screening colonoscopy.  The patient will set that up in January of next year.  [name], MD. Marval Regal    Note: This dictation was prepared with Dragon dictation along with smaller phrase technology. Any transcriptional errors that result from this process are unintentional.

## 2018-03-16 ENCOUNTER — Ambulatory Visit (INDEPENDENT_AMBULATORY_CARE_PROVIDER_SITE_OTHER): Payer: Self-pay

## 2018-03-16 ENCOUNTER — Ambulatory Visit
Admission: EM | Admit: 2018-03-16 | Discharge: 2018-03-16 | Disposition: A | Discharge status: Home / Self Care | Payer: Self-pay | Attending: Family Medicine | Admitting: Family Medicine

## 2018-03-16 ENCOUNTER — Other Ambulatory Visit: Payer: Self-pay

## 2018-03-16 ENCOUNTER — Encounter: Payer: Self-pay | Admitting: Emergency Medicine

## 2018-03-16 DIAGNOSIS — J069 Acute upper respiratory infection, unspecified: Secondary | ICD-10-CM

## 2018-03-16 DIAGNOSIS — R059 Cough, unspecified: Secondary | ICD-10-CM

## 2018-03-16 DIAGNOSIS — R05 Cough: Secondary | ICD-10-CM

## 2018-03-16 MED ORDER — HYDROCOD POLST-CPM POLST ER 10-8 MG/5ML PO SUER: 5.0000 mL | Freq: Every evening | ORAL | 0 refills | Status: DC | PRN

## 2018-03-16 MED ORDER — DOXYCYCLINE HYCLATE 100 MG PO CAPS: 100.0000 mg | ORAL_CAPSULE | Freq: Two times a day (BID) | ORAL | 0 refills | Status: DC

## 2018-03-16 MED ORDER — BENZONATATE 100 MG PO CAPS: 100.0000 mg | ORAL_CAPSULE | Freq: Three times a day (TID) | ORAL | 0 refills | Status: DC | PRN

## 2018-03-16 NOTE — ED Triage Notes (Signed)
Patient c/o cough, low grade fever and chest congestion that started 4 days ago. Patient has not been taking any OTC medications.

## 2018-03-16 NOTE — ED Provider Notes (Signed)
[facility] ____________________________________________  Time seen: Approximately 8:40 AM  I have reviewed the triage vital signs and the nursing notes.   HISTORY  Chief Complaint No chief complaint on file.   HPI Brett Marshall is a 52 y.o. male has medical history of diabetes, morbid obesity, degenerative disc disease, congestive heart failure and pulmonary hypertension presenting for evaluation of 4 days of runny nose, nasal congestion, cough and chest congestion.  States he has had some variant fevers, states fever not greater than 100 degrees.  States feels that this started off similarly to previous influenza.  Denies known sick contacts.  States cough is sometimes productive of whitish-yellowish phlegm.  States he feels that there is congestion in his chest and trying to cough to clear it leads to some shortness of breath and tightness.  Denies other shortness of breath.  Denies hearing self wheeze.  Denies any associated chest pain or pain with deep breathing.  Chronic lower extremity edema, but denies any acute swelling from baseline.  Denies any recent immobilization, travel or other recent sickness.  Continues to eat and drink well.  Has not taken any over-the-counter medications for the same complaints.  Denies other aggravating alleviating factors.  Reports otherwise doing well. Denies recent sickness. Denies recent antibiotic use.   [name], MD: PCP   Past Medical History:  Diagnosis Date  . Asthma   . CHF (congestive heart failure) ([place])   . DDD (degenerative disc disease), lumbar   . Depression   . Diabetes mellitus without complication ([place])   . Essential hypertension, benign   . Leg swelling    Left  . Low back pain   . OSA on CPAP    non compliant    Patient Active Problem List   Diagnosis Date Noted  . Chronic systolic congestive heart failure ([place]) 12/09/2017  . Pulmonary hypertension ([place]) 12/09/2017  . NASH (nonalcoholic  steatohepatitis) 11/07/2017  . Essential hypertension 09/13/2016  . DISH (diffuse idiopathic skeletal hyperostosis) 05/17/2016  . DDD (degenerative disc disease), lumbar 05/17/2016  . Type 2 diabetes mellitus without complication, without long-term current use of insulin ([place]) 02/08/2016  . Gynecomastia 02/08/2016  . Morbid obesity ([place]) 02/08/2016    Past Surgical History:  Procedure Laterality Date  . CARDIAC CATHETERIZATION     [place] no stents.  Marland Kitchen RIGHT/LEFT HEART CATH AND CORONARY ANGIOGRAPHY N/A 09/02/2017   Procedure: RIGHT/LEFT HEART CATH AND CORONARY ANGIOGRAPHY;  Surgeon: Wellington Hampshire, MD;  Location: [facility];  Service: Cardiovascular;  Laterality: N/A;  per most recent cardiology note: "Right and left cardiac catheterization done in April of this year showed mild nonobstructive coronary artery disease, mildly reduced LV systolic function with an EF of 45 to 50% with global hypokinesis.  Right heart catheterization showed moderately elevated filling pressures with a wedge pressure of 22 mmHg, mild pulmonary hypertension and high cardiac output.  The dose of furosemide was decreased to 20 mg daily after his labs showed slight volume depletion."   No current facility-administered medications for this encounter.   Current Outpatient Medications:  .  carvedilol (COREG) 6.25 MG tablet, Take 1 tablet (6.25 mg total) by mouth 2 (two) times daily., Disp: 60 tablet, Rfl: 5 .  cyclobenzaprine (FLEXERIL) 10 MG tablet, Take 1 tablet (10 mg total) by mouth 3 (three) times daily as needed.For spasms, Disp: 90 tablet, Rfl: 5 .  DULoxetine (CYMBALTA) 30 MG capsule, Take 1 capsule (30 mg total) by mouth daily., Disp:  90 capsule, Rfl: 1 .  enalapril (VASOTEC) 20 MG tablet, Take 1 tablet (20 mg total) by mouth 2 (two) times daily., Disp: 60 tablet, Rfl: 5 .  furosemide (LASIX) 20 MG tablet, Take 1 tablet (20 mg total) by mouth daily., Disp: 90 tablet, Rfl: 3 .  glipiZIDE  (GLUCOTROL) 5 MG tablet, Take 1 tablet (5 mg total) by mouth 2 (two) times daily before a meal., Disp: 180 tablet, Rfl: 1 .  glucose blood (GE100 BLOOD GLUCOSE TEST) test strip, Use as instructed, Disp: 100 each, Rfl: 12 .  Lancets (ONETOUCH ULTRASOFT) lancets, Check sugar twice daily, Disp: 100 each, Rfl: 12 .  metFORMIN (GLUCOPHAGE) 1000 MG tablet, Take 1 tablet (1,000 mg total) by mouth 2 (two) times daily with a meal., Disp: 180 tablet, Rfl: 1 .  ranitidine (ZANTAC) 150 MG capsule, Take 150 mg by mouth 2 (two) times daily., Disp: , Rfl:  .  benzonatate (TESSALON PERLES) 100 MG capsule, Take 1 capsule (100 mg total) by mouth 3 (three) times daily as needed for cough., Disp: 15 capsule, Rfl: 0 .  chlorpheniramine-HYDROcodone (TUSSIONEX PENNKINETIC ER) 10-8 MG/5ML SUER, Take 5 mLs by mouth at bedtime as needed for cough. do not drive or operate machinery while taking as can cause drowsiness., Disp: 50 mL, Rfl: 0 .  doxycycline (VIBRAMYCIN) 100 MG capsule, Take 1 capsule (100 mg total) by mouth 2 (two) times daily., Disp: 20 capsule, Rfl: 0  Allergies Patient has no known allergies.  Family History  Problem Relation Age of Onset  . Diabetes Mother   . Asthma Mother   . Heart disease Father   . Heart attack Father   . Sjogren's syndrome Sister   . Heart disease Brother 92       CAD/CABG    Social History Social History   Tobacco Use  . Smoking status: Never Smoker  . Smokeless tobacco: Never Used  Substance Use Topics  . Alcohol use: Yes    Comment: few times per year  . Drug use: No    Review of Systems Constitutional: Positive low-grade fevers. ENT: No sore throat.  Positive nasal congestion. Cardiovascular: Denies chest pain. Respiratory: Denies shortness of breath. Gastrointestinal: No abdominal pain.   Skin: Negative for rash. Neurological: Negative for headaches, focal weakness or numbness.    ____________________________________________   PHYSICAL EXAM:  VITAL  SIGNS: ED Triage Vitals  Enc Vitals Group     BP 03/16/18 0827 (!) 156/96     Pulse Rate 03/16/18 0827 91     Resp 03/16/18 0827 18     Temp 03/16/18 0827 98.7 F (37.1 C)     Temp Source 03/16/18 0827 Oral     SpO2 03/16/18 0827 99 %     Weight 03/16/18 0826 (!) 371 lb (168.3 kg)     Height 03/16/18 0826 5\' 8"  (1.727 m)     Head Circumference --      Peak Flow --      Pain Score 03/16/18 0826 0     Pain Loc --      Pain Edu? --      Excl. in [place]? --    Constitutional: Alert and oriented. Well appearing and in no acute distress. Eyes: Conjunctivae are normal. Head: Atraumatic. No sinus tenderness to palpation. No swelling. No erythema.  Ears: no erythema, normal TMs bilaterally.   Nose:Nasal congestion with clear rhinorrhea  Mouth/Throat: Mucous membranes are moist. No pharyngeal erythema. No tonsillar swelling or exudate.  Neck: No  stridor.  No cervical spine tenderness to palpation. Hematological/Lymphatic/Immunilogical: No cervical lymphadenopathy. Cardiovascular: Normal rate, regular rhythm. Grossly normal heart sounds.  Good peripheral circulation. Respiratory: Normal respiratory effort.  No retractions. No wheezes.  Scattered rhonchi.  Good air movement.  Dry intermittent cough noted in room.  Speaks in complete sentences. Gastrointestinal: obese abdomen. Musculoskeletal: Ambulatory with steady gait.  Neurologic:  Normal speech and language. No gait instability. Skin:  Skin appears warm, dry and intact. No rash noted. Psychiatric: Mood and affect are normal. Speech and behavior are normal.  ___________________________________________   LABS (all labs ordered are listed, but only abnormal results are displayed)  Labs Reviewed - No data to display  RADIOLOGY  Dg Chest 2 View  Result Date: 03/16/2018 CLINICAL DATA:  Productive cough, chest congestion, shortness of breath, and fever for the past 4 days. History of CHF, asthma-chronic bronchitis, nonsmoker. EXAM: CHEST  - 2 VIEW COMPARISON:  PA and lateral chest x-ray of August 27, 2017 FINDINGS: The lungs are well-expanded. There is no focal infiltrate. There is no pleural effusion. The heart and pulmonary vascularity are normal. The mediastinum is normal in width. There is calcification of the anterior longitudinal ligament of the thoracic spine. IMPRESSION: Mild chronic bronchitic changes.  No alveolar pneumonia nor CHF. DISH. Electronically Signed   By: David  Martinique M.D.   On: 03/16/2018 09:21   ____________________________________________   PROCEDURES Procedures   INITIAL IMPRESSION / ASSESSMENT AND PLAN / ED COURSE  Pertinent labs & imaging results that were available during my care of the patient were reviewed by me and considered in my medical decision making (see chart for details).  Overall well-appearing patient.  No acute distress.  Suspect recent viral illness.  Patient with multiple comorbidities, will evaluate chest x-ray.  Chest x-ray result as above per radiologist and reviewed by myself, mild chronic bronchitic changes, no alveolar pneumonia nor CHF.  Discussed this with patient.  Again suspect viral illness, however with multiple comorbidities will initiate oral doxycycline, PRN Tessalon Perles and Tussionex.  Encourage rest, fluids, supportive care, over-the-counter Mucinex as needed.  Discussed very strict follow-up and return parameters.Discussed indication, risks and benefits of medications with patient.  Discussed follow up with Primary care physician this week. Discussed follow up and return parameters including no resolution or any worsening concerns. Patient verbalized understanding and agreed to plan.   ____________________________________________   FINAL CLINICAL IMPRESSION(S) / ED DIAGNOSES  Final diagnoses:  Acute upper respiratory infection  Cough     ED Discharge Orders         Ordered    doxycycline (VIBRAMYCIN) 100 MG capsule  2 times daily     03/16/18 0938     benzonatate (TESSALON PERLES) 100 MG capsule  3 times daily PRN     03/16/18 2330    chlorpheniramine-HYDROcodone (TUSSIONEX PENNKINETIC ER) 10-8 MG/5ML SUER  At bedtime PRN     03/16/18 0762           Note: This dictation was prepared with Dragon dictation along with smaller phrase technology. Any transcriptional errors that result from this process are unintentional.         Marylene Land, NP 03/16/18 [phone]

## 2018-03-16 NOTE — Discharge Instructions (Addendum)
Take medication as prescribed. Rest. Drink plenty of fluids.  Over-the-counter Mucinex as needed.  Follow up with your primary care physician this week as needed. Return to Urgent care or emergency room for new or worsening concerns.

## 2018-03-20 ENCOUNTER — Encounter: Payer: Self-pay | Admitting: Internal Medicine

## 2018-03-22 ENCOUNTER — Encounter: Payer: Self-pay | Admitting: Emergency Medicine

## 2018-03-22 ENCOUNTER — Ambulatory Visit
Admission: EM | Admit: 2018-03-22 | Discharge: 2018-03-22 | Disposition: A | Discharge status: Home / Self Care | Payer: Self-pay | Attending: Family Medicine | Admitting: Family Medicine

## 2018-03-22 ENCOUNTER — Other Ambulatory Visit: Payer: Self-pay

## 2018-03-22 DIAGNOSIS — R059 Cough, unspecified: Secondary | ICD-10-CM

## 2018-03-22 DIAGNOSIS — R05 Cough: Secondary | ICD-10-CM

## 2018-03-22 MED ORDER — ALBUTEROL SULFATE HFA 108 (90 BASE) MCG/ACT IN AERS
1.0000 | INHALATION_SPRAY | Freq: Four times a day (QID) | RESPIRATORY_TRACT | 0 refills | Status: DC | PRN
Start: 2018-03-22 — End: 2020-09-11

## 2018-03-22 MED ORDER — PREDNISONE 50 MG PO TABS: ORAL_TABLET | ORAL | 0 refills | Status: DC

## 2018-03-22 MED ORDER — HYDROCOD POLST-CPM POLST ER 10-8 MG/5ML PO SUER: 5.0000 mL | Freq: Every evening | ORAL | 0 refills | Status: DC | PRN

## 2018-03-22 NOTE — ED Triage Notes (Signed)
Patient in today c/o continued cough, chest congestion and fever (99) off & on. Patient was seen here on 03/16/18 and was starting to feel better, but now feels worse.

## 2018-03-22 NOTE — Discharge Instructions (Signed)
Medications as prescribed. ° °Take care ° °Dr. Matej Sappenfield  °

## 2018-03-22 NOTE — ED Provider Notes (Signed)
[facility]    CSN: [id number] Arrival date & time: 03/22/18  0254  History   Chief Complaint Chief Complaint  Patient presents with  . Cough  . chest congestion   HPI  52 year old male presents with the above complaints.  Patient recently seen on 10/30.  Placed on doxycycline and given cough medication.  Patient reports that he improved but then worsened again on Sunday.  Patient reports worsening cough since that period of time.  Intermittently productive.  Reports fever but has not had a true fever.  His temperature has been as high as 99.  Still has persistent cough which is troublesome.  States that the cough medicines have helped but he is now out.  Still on doxycycline.  No other associated symptoms.  No other complaints.  PMH, Surgical Hx, Family Hx, Social History reviewed and updated as below.  Past Medical History:  Diagnosis Date  . Asthma   . CHF (congestive heart failure) ([place])   . DDD (degenerative disc disease), lumbar   . Depression   . Diabetes mellitus without complication ([place])   . Essential hypertension, benign   . Leg swelling    Left  . Low back pain   . OSA on CPAP    non compliant    Patient Active Problem List   Diagnosis Date Noted  . Chronic systolic congestive heart failure ([place]) 12/09/2017  . Pulmonary hypertension ([place]) 12/09/2017  . NASH (nonalcoholic steatohepatitis) 11/07/2017  . Essential hypertension 09/13/2016  . DISH (diffuse idiopathic skeletal hyperostosis) 05/17/2016  . DDD (degenerative disc disease), lumbar 05/17/2016  . Type 2 diabetes mellitus without complication, without long-term current use of insulin ([place]) 02/08/2016  . Gynecomastia 02/08/2016  . Morbid obesity ([place]) 02/08/2016    Past Surgical History:  Procedure Laterality Date  . CARDIAC CATHETERIZATION     [place] no stents.  Marland Kitchen RIGHT/LEFT HEART CATH AND CORONARY ANGIOGRAPHY N/A 09/02/2017   Procedure: RIGHT/LEFT HEART CATH AND CORONARY  ANGIOGRAPHY;  Surgeon: Wellington Hampshire, MD;  Location: [facility];  Service: Cardiovascular;  Laterality: N/A;   Home Medications    Prior to Admission medications   Medication Sig Start Date End Date Taking? Authorizing Provider  benzonatate (TESSALON PERLES) 100 MG capsule Take 1 capsule (100 mg total) by mouth 3 (three) times daily as needed for cough. 03/16/18  Yes Marylene Land, NP  carvedilol (COREG) 6.25 MG tablet Take 1 tablet (6.25 mg total) by mouth 2 (two) times daily. 01/24/18 01/24/19 Yes Wellington Hampshire, MD  cyclobenzaprine (FLEXERIL) 10 MG tablet Take 1 tablet (10 mg total) by mouth 3 (three) times daily as needed.For spasms 11/20/17  Yes [name], MD  doxycycline (VIBRAMYCIN) 100 MG capsule Take 1 capsule (100 mg total) by mouth 2 (two) times daily. 03/16/18  Yes Marylene Land, NP  DULoxetine (CYMBALTA) 30 MG capsule Take 1 capsule (30 mg total) by mouth daily. 08/23/17  Yes [name], MD  enalapril (VASOTEC) 20 MG tablet Take 1 tablet (20 mg total) by mouth 2 (two) times daily. 11/05/17  Yes [name], MD  furosemide (LASIX) 20 MG tablet Take 1 tablet (20 mg total) by mouth daily. 09/17/17  Yes Wellington Hampshire, MD  glipiZIDE (GLUCOTROL) 5 MG tablet Take 1 tablet (5 mg total) by mouth 2 (two) times daily before a meal. 12/09/17  Yes [name], MD  glucose blood (GE100 BLOOD GLUCOSE TEST) test strip Use as instructed 02/03/16  Yes [name],  Renette Butters, MD  Lancets [facility] ULTRASOFT) lancets Check sugar twice daily 01/11/16  Yes [name], MD  metFORMIN (GLUCOPHAGE) 1000 MG tablet Take 1 tablet (1,000 mg total) by mouth 2 (two) times daily with a meal. 12/09/17  Yes [name], MD  albuterol (PROVENTIL HFA;VENTOLIN HFA) 108 (90 Base) MCG/ACT inhaler Inhale 1-2 puffs into the lungs every 6 (six) hours as needed for wheezing or shortness of breath. 03/22/18   Coral Spikes, DO  chlorpheniramine-HYDROcodone (TUSSIONEX PENNKINETIC ER) 10-8 MG/5ML SUER  Take 5 mLs by mouth at bedtime as needed. 03/22/18   Coral Spikes, DO  predniSONE (DELTASONE) 50 MG tablet 1 tablet daily x 5 days 03/22/18   Coral Spikes, DO    Family History Family History  Problem Relation Age of Onset  . Diabetes Mother   . Asthma Mother   . Heart disease Father   . Heart attack Father   . Sjogren's syndrome Sister   . Heart disease Brother 77       CAD/CABG    Social History Social History   Tobacco Use  . Smoking status: Never Smoker  . Smokeless tobacco: Never Used  Substance Use Topics  . Alcohol use: Yes    Comment: few times per year  . Drug use: No     Allergies   Patient has no known allergies.   Review of Systems Review of Systems  Constitutional: Positive for fever.  Respiratory: Positive for cough.    Physical Exam Triage Vital Signs ED Triage Vitals  Enc Vitals Group     BP 03/22/18 1042 130/77     Pulse Rate 03/22/18 1042 85     Resp 03/22/18 1042 16     Temp 03/22/18 1042 98.2 F (36.8 C)     Temp Source 03/22/18 1042 Oral     SpO2 03/22/18 1042 100 %     Weight 03/22/18 1041 (!) 371 lb (168.3 kg)     Height 03/22/18 1041 5\' 8"  (1.727 m)     Head Circumference --      Peak Flow --      Pain Score 03/22/18 1040 0     Pain Loc --      Pain Edu? --      Excl. in [place]? --    Updated Vital Signs BP 130/77 (BP Location: Left Arm)   Pulse 85   Temp 98.2 F (36.8 C) (Oral)   Resp 16   Ht 5\' 8"  (1.727 m)   Wt (!) 168.3 kg   SpO2 100%   BMI 56.41 kg/m   Visual Acuity Right Eye Distance:   Left Eye Distance:   Bilateral Distance:    Right Eye Near:   Left Eye Near:    Bilateral Near:     Physical Exam  Constitutional: He is oriented to person, place, and time. He appears well-developed. No distress.  HENT:  Head: Normocephalic and atraumatic.  Mouth/Throat: Oropharynx is clear and moist.  Cardiovascular: Normal rate and regular rhythm.  Pulmonary/Chest: Effort normal and breath sounds normal. He has no  wheezes.  Neurological: He is alert and oriented to person, place, and time.  Psychiatric: He has a normal mood and affect. His behavior is normal.  Nursing note and vitals reviewed.  UC Treatments / Results  Labs (all labs ordered are listed, but only abnormal results are displayed) Labs Reviewed - No data to display  EKG None  Radiology No results found.  Procedures Procedures (  including critical care time)  Medications Ordered in UC Medications - No data to display  Initial Impression / Assessment and Plan / UC Course  I have reviewed the triage vital signs and the nursing notes.  Pertinent labs & imaging results that were available during my care of the patient were reviewed by me and considered in my medical decision making (see chart for details).    52 year old male presents with persistent cough.  Patient had recent imaging as well as antibiotic therapy.  No indication for additional work-up.  Well-appearing.  Exam unremarkable.  Additional Tussionex given.  Albuterol as needed.  After discussion, we elected to proceed with brief course of steroids given patient's known asthma and persistent cough.  Final Clinical Impressions(s) / UC Diagnoses   Final diagnoses:  Cough     Discharge Instructions     Medications as prescribed.  Take care  Dr. Lacinda Axon    ED Prescriptions    Medication Sig Dispense Auth. Provider   predniSONE (DELTASONE) 50 MG tablet 1 tablet daily x 5 days 5 tablet Inger Wiest G, DO   albuterol (PROVENTIL HFA;VENTOLIN HFA) 108 (90 Base) MCG/ACT inhaler Inhale 1-2 puffs into the lungs every 6 (six) hours as needed for wheezing or shortness of breath. 1 Inhaler [place], [place] G, DO   chlorpheniramine-HYDROcodone (TUSSIONEX PENNKINETIC ER) 10-8 MG/5ML SUER Take 5 mLs by mouth at bedtime as needed. 60 mL Coral Spikes, DO     Controlled Substance Prescriptions [place] Controlled Substance Registry consulted? Yes, I have consulted the [place] Controlled  Substances Registry for this patient.    Coral Spikes, [place] 03/22/18 1157

## 2018-04-10 DIAGNOSIS — G4733 Obstructive sleep apnea (adult) (pediatric): Secondary | ICD-10-CM | POA: Insufficient documentation

## 2018-04-10 DIAGNOSIS — K219 Gastro-esophageal reflux disease without esophagitis: Secondary | ICD-10-CM | POA: Insufficient documentation

## 2018-04-26 ENCOUNTER — Other Ambulatory Visit: Payer: Self-pay

## 2018-04-26 ENCOUNTER — Emergency Department: Payer: Self-pay

## 2018-04-26 ENCOUNTER — Encounter: Payer: Self-pay | Admitting: Emergency Medicine

## 2018-04-26 ENCOUNTER — Emergency Department
Admission: EM | Admit: 2018-04-26 | Discharge: 2018-04-26 | Disposition: A | Discharge status: Home / Self Care | Payer: Self-pay | Attending: Student in an Organized Health Care Education/Training Program | Admitting: Student in an Organized Health Care Education/Training Program

## 2018-04-26 DIAGNOSIS — E119 Type 2 diabetes mellitus without complications: Secondary | ICD-10-CM | POA: Insufficient documentation

## 2018-04-26 DIAGNOSIS — Z7984 Long term (current) use of oral hypoglycemic drugs: Secondary | ICD-10-CM | POA: Insufficient documentation

## 2018-04-26 DIAGNOSIS — R059 Cough, unspecified: Secondary | ICD-10-CM

## 2018-04-26 DIAGNOSIS — Z79899 Other long term (current) drug therapy: Secondary | ICD-10-CM | POA: Insufficient documentation

## 2018-04-26 DIAGNOSIS — R1031 Right lower quadrant pain: Secondary | ICD-10-CM | POA: Insufficient documentation

## 2018-04-26 DIAGNOSIS — R079 Chest pain, unspecified: Secondary | ICD-10-CM | POA: Insufficient documentation

## 2018-04-26 DIAGNOSIS — I10 Essential (primary) hypertension: Secondary | ICD-10-CM | POA: Insufficient documentation

## 2018-04-26 DIAGNOSIS — R05 Cough: Secondary | ICD-10-CM | POA: Insufficient documentation

## 2018-04-26 LAB — BASIC METABOLIC PANEL
ANION GAP: 8 (ref 5–15)
BUN: 25 mg/dL — AB (ref 6–20)
CO2: 25 mmol/L (ref 22–32)
Calcium: 8.7 mg/dL — ABNORMAL LOW (ref 8.9–10.3)
Chloride: 103 mmol/L (ref 98–111)
Creatinine, Ser: 1.26 mg/dL — ABNORMAL HIGH (ref 0.61–1.24)
Glucose, Bld: 153 mg/dL — ABNORMAL HIGH (ref 70–99)
POTASSIUM: 4.3 mmol/L (ref 3.5–5.1)
SODIUM: 136 mmol/L (ref 135–145)

## 2018-04-26 LAB — TROPONIN I: Troponin I: <0.03 ng/mL (ref <0.03)

## 2018-04-26 LAB — CBC
HCT: 39.7 % (ref 39.0–52.0)
HEMOGLOBIN: 13.2 g/dL (ref 13.0–17.0)
MCH: 28 pg (ref 26.0–34.0)
MCHC: 33.2 g/dL (ref 30.0–36.0)
MCV: 84.3 fL (ref 80.0–100.0)
NRBC: 0 % (ref 0.0–0.2)
Platelets: 264 10*3/uL (ref 150–400)
RBC: 4.71 MIL/uL (ref 4.22–5.81)
RDW: 13.2 % (ref 11.5–15.5)
WBC: 12.5 10*3/uL — AB (ref 4.0–10.5)

## 2018-04-26 MED ORDER — PREDNISONE 10 MG PO TABS: 10.0000 mg | ORAL_TABLET | Freq: Every day | ORAL | 0 refills | Status: DC

## 2018-04-26 MED ORDER — IOPAMIDOL (ISOVUE-370) INJECTION 76%
125.0000 mL | Freq: Once | INTRAVENOUS | Status: AC | PRN
  Administered 2018-04-26: 125 mL via INTRAVENOUS
  Filled 2018-04-26: qty 125

## 2018-04-26 MED ORDER — IPRATROPIUM-ALBUTEROL 0.5-2.5 (3) MG/3ML IN SOLN
3.0000 mL | Freq: Once | RESPIRATORY_TRACT | Status: AC
  Administered 2018-04-26: 3 mL via RESPIRATORY_TRACT

## 2018-04-26 MED ORDER — IOPAMIDOL (ISOVUE-370) INJECTION 76%
100.0000 mL | Freq: Once | INTRAVENOUS | Status: DC | PRN
  Filled 2018-04-26: qty 100

## 2018-04-26 MED ORDER — IPRATROPIUM-ALBUTEROL 0.5-2.5 (3) MG/3ML IN SOLN
RESPIRATORY_TRACT | Status: AC
  Filled 2018-04-26: qty 6

## 2018-04-26 MED ORDER — AZITHROMYCIN 500 MG PO TABS: 500.0000 mg | ORAL_TABLET | Freq: Every day | ORAL | 0 refills | Status: AC

## 2018-04-26 MED ORDER — HYDROCOD POLST-CPM POLST ER 10-8 MG/5ML PO SUER: 5.0000 mL | Freq: Every evening | ORAL | 0 refills | Status: DC | PRN

## 2018-04-26 NOTE — Discharge Instructions (Signed)

## 2018-04-26 NOTE — ED Triage Notes (Signed)
Pt arrives with complaints of chest pain/tightness and cough for the last 2 weeks. Pt also reports right lower quadrant pounding pain. Pt reports the intensity of his chest and abdomen increase with coughing. Pt in NAD. Breathing unlabored and clear lung sounds.

## 2018-04-26 NOTE — ED Provider Notes (Signed)
[facility] Emergency Department Provider Note    First MD Initiated Contact with Patient 04/26/18 1934     (approximate)  I have reviewed the triage vital signs and the nursing notes.   HISTORY  Chief Complaint Chest Pain    HPI Brett Marshall is a 52 y.o. male listed past medical history presents the ER with chief complaint of chest tightness and productive cough for the past 2 weeks.  States is also developed right lower quadrant pain over the past 3 days.  Has had low-grade fever.  Denies any history of blood clots.  Was recently on doxycycline for bronchitis.  Was discharged home with prednisone as well as albuterol.  Is never had pain like this before.  Denies any diaphoresis.  No pain when taking deep inspiration.  Denies any shortness of breath.  Does not wear home oxygen.    Past Medical History:  Diagnosis Date  . Asthma   . CHF (congestive heart failure) ([place])   . DDD (degenerative disc disease), lumbar   . Depression   . Diabetes mellitus without complication ([place])   . Essential hypertension, benign   . Leg swelling    Left  . Low back pain   . OSA on CPAP    non compliant   Family History  Problem Relation Age of Onset  . Diabetes Mother   . Asthma Mother   . Heart disease Father   . Heart attack Father   . Sjogren's syndrome Sister   . Heart disease Brother 1       CAD/CABG   Past Surgical History:  Procedure Laterality Date  . CARDIAC CATHETERIZATION     [place] no stents.  Marland Kitchen RIGHT/LEFT HEART CATH AND CORONARY ANGIOGRAPHY N/A 09/02/2017   Procedure: RIGHT/LEFT HEART CATH AND CORONARY ANGIOGRAPHY;  Surgeon: Wellington Hampshire, MD;  Location: [facility];  Service: Cardiovascular;  Laterality: N/A;   Patient Active Problem List   Diagnosis Date Noted  . Chronic systolic congestive heart failure ([place]) 12/09/2017  . Pulmonary hypertension ([place]) 12/09/2017  . NASH (nonalcoholic steatohepatitis) 11/07/2017  .  Essential hypertension 09/13/2016  . DISH (diffuse idiopathic skeletal hyperostosis) 05/17/2016  . DDD (degenerative disc disease), lumbar 05/17/2016  . Type 2 diabetes mellitus without complication, without long-term current use of insulin ([place]) 02/08/2016  . Gynecomastia 02/08/2016  . Morbid obesity ([place]) 02/08/2016      Prior to Admission medications   Medication Sig Start Date End Date Taking? Authorizing Provider  albuterol (PROVENTIL HFA;VENTOLIN HFA) 108 (90 Base) MCG/ACT inhaler Inhale 1-2 puffs into the lungs every 6 (six) hours as needed for wheezing or shortness of breath. 03/22/18   Coral Spikes, DO  azithromycin (ZITHROMAX) 500 MG tablet Take 1 tablet (500 mg total) by mouth daily for 3 days. 04/26/18 04/29/18  Merlyn Lot, MD  benzonatate (TESSALON PERLES) 100 MG capsule Take 1 capsule (100 mg total) by mouth 3 (three) times daily as needed for cough. 03/16/18   Marylene Land, NP  carvedilol (COREG) 6.25 MG tablet Take 1 tablet (6.25 mg total) by mouth 2 (two) times daily. 01/24/18 01/24/19  Wellington Hampshire, MD  chlorpheniramine-HYDROcodone (TUSSIONEX PENNKINETIC ER) 10-8 MG/5ML SUER Take 5 mLs by mouth at bedtime as needed. 03/22/18   Coral Spikes, DO  chlorpheniramine-HYDROcodone (TUSSIONEX PENNKINETIC ER) 10-8 MG/5ML SUER Take 5 mLs by mouth at bedtime as needed for cough. 04/26/18   Merlyn Lot, MD  cyclobenzaprine (FLEXERIL) 10 MG tablet  Take 1 tablet (10 mg total) by mouth 3 (three) times daily as needed.For spasms 11/20/17   [name], MD  doxycycline (VIBRAMYCIN) 100 MG capsule Take 1 capsule (100 mg total) by mouth 2 (two) times daily. 03/16/18   Marylene Land, NP  DULoxetine (CYMBALTA) 30 MG capsule Take 1 capsule (30 mg total) by mouth daily. 08/23/17   [name], MD  enalapril (VASOTEC) 20 MG tablet Take 1 tablet (20 mg total) by mouth 2 (two) times daily. 11/05/17   [name], MD  furosemide (LASIX) 20 MG tablet Take 1 tablet (20 mg total)  by mouth daily. 09/17/17   Wellington Hampshire, MD  glipiZIDE (GLUCOTROL) 5 MG tablet Take 1 tablet (5 mg total) by mouth 2 (two) times daily before a meal. 12/09/17   [name], MD  glucose blood (GE100 BLOOD GLUCOSE TEST) test strip Use as instructed 02/03/16   [name], MD  Lancets [facility] ULTRASOFT) lancets Check sugar twice daily 01/11/16   [name], MD  metFORMIN (GLUCOPHAGE) 1000 MG tablet Take 1 tablet (1,000 mg total) by mouth 2 (two) times daily with a meal. 12/09/17   [name], MD  predniSONE (DELTASONE) 10 MG tablet Take 1 tablet (10 mg total) by mouth daily. Day 1-2: Take 50 mg  ( 5 pills) Day 3-4 : Take 40 mg (4pills) Day 5-6: Take 30 mg (3 pills) Day 7-8:  Take 20 mg (2 pills) Day 9:  Take 10mg  (1 pill) 04/26/18   Merlyn Lot, MD  predniSONE (DELTASONE) 50 MG tablet 1 tablet daily x 5 days 03/22/18   Coral Spikes, DO    Allergies Patient has no known allergies.    Social History Social History   Tobacco Use  . Smoking status: Never Smoker  . Smokeless tobacco: Never Used  Substance Use Topics  . Alcohol use: Yes    Comment: few times per year  . Drug use: No    Review of Systems Patient denies headaches, rhinorrhea, blurry vision, numbness, shortness of breath, chest pain, edema, cough, abdominal pain, nausea, vomiting, diarrhea, dysuria, fevers, rashes or hallucinations unless otherwise stated above in HPI. ____________________________________________   PHYSICAL EXAM:  VITAL SIGNS: Vitals:   04/26/18 2027 04/26/18 2230  BP: (!) 149/79 129/77  Pulse: 91 92  Resp: 20 20  Temp:    SpO2: 97% 96%    Constitutional: Alert and oriented.  Eyes: Conjunctivae are normal.  Head: Atraumatic. Nose: No congestion/rhinnorhea. Mouth/Throat: Mucous membranes are moist.   Neck: No stridor. Painless ROM.  Cardiovascular: Normal rate, regular rhythm. Grossly normal heart sounds.  Good peripheral circulation. Respiratory: Normal respiratory  effort.  No retractions. Lungs with coarse bibasilar breathsounds Gastrointestinal: Soft with mild tenderness palpation right lower quadrant.. No distention. No abdominal bruits. No CVA tenderness. Genitourinary:  Musculoskeletal: No lower extremity tenderness nor edema.  No joint effusions. Neurologic:  Normal speech and language. No gross focal neurologic deficits are appreciated. No facial droop Skin:  Skin is warm, dry and intact. No rash noted. Psychiatric: Mood and affect are normal. Speech and behavior are normal.  ____________________________________________   LABS (all labs ordered are listed, but only abnormal results are displayed)  Results for orders placed or performed during the hospital encounter of 04/26/18 (from the past 24 hour(s))  Basic metabolic panel     Status: Abnormal   Collection Time: 04/26/18  5:42 PM  Result Value Ref Range   Sodium 136 135 - 145  mmol/L   Potassium 4.3 3.5 - 5.1 mmol/L   Chloride 103 98 - 111 mmol/L   CO2 25 22 - 32 mmol/L   Glucose, Bld 153 (H) 70 - 99 mg/dL   BUN 25 (H) 6 - 20 mg/dL   Creatinine, Ser 1.26 (H) 0.61 - 1.24 mg/dL   Calcium 8.7 (L) 8.9 - 10.3 mg/dL   GFR calc non Af Amer >60 >60 mL/min   GFR calc Af Amer >60 >60 mL/min   Anion gap 8 5 - 15  CBC     Status: Abnormal   Collection Time: 04/26/18  5:42 PM  Result Value Ref Range   WBC 12.5 (H) 4.0 - 10.5 K/uL   RBC 4.71 4.22 - 5.81 MIL/uL   Hemoglobin 13.2 13.0 - 17.0 g/dL   HCT 39.7 39.0 - 52.0 %   MCV 84.3 80.0 - 100.0 fL   MCH 28.0 26.0 - 34.0 pg   MCHC 33.2 30.0 - 36.0 g/dL   RDW 13.2 11.5 - 15.5 %   Platelets 264 150 - 400 K/uL   nRBC 0.0 0.0 - 0.2 %  Troponin I - ONCE - STAT     Status: None   Collection Time: 04/26/18  5:42 PM  Result Value Ref Range   Troponin I <0.03 <0.03 ng/mL  Troponin I - ONCE - STAT     Status: None   Collection Time: 04/26/18  9:49 PM  Result Value Ref Range   Troponin I <0.03 <0.03 ng/mL    ____________________________________________  EKG My review and personal interpretation at Time: 17:34   Indication: cough  Rate: 100  Rhythm: sinus Axis: normal Other: poor r wave progression, nonspecific st abn, no stemi ____________________________________________  RADIOLOGY  I personally reviewed all radiographic images ordered to evaluate for the above acute complaints and reviewed radiology reports and findings.  These findings were personally discussed with the patient.  Please see medical record for radiology report.  ____________________________________________   PROCEDURES  Procedure(s) performed:  Procedures    Critical Care performed: no ____________________________________________   INITIAL IMPRESSION / ASSESSMENT AND PLAN / ED COURSE  Pertinent labs & imaging results that were available during my care of the patient were reviewed by me and considered in my medical decision making (see chart for details).   DDX: Myositis, colitis, enteritis, pneumonia, bronchitis, asthma, ACS  Velma R Finigan is a 52 y.o. who presents to the ED with symptoms as described above.  Patient without any hypoxia.  Does have some wheezing on exam certainly concerning for bronchitis however does have marked tenderness to palpation the right lower quadrant.  He is recently on antibiotics which could be giving mixed picture for appendicitis.  Based on his presentation will order CT imaging to evaluate for his acute abdominal pain.  We will give nebulizer to evaluate for improvement evaluate bronchitis.  EKG does not show any evidence of acute ischemia.  Initial troponin is negative.  Will order serial enzymes.  Clinical Course as of Apr 26 2240  Tue Apr 26, 2018  2204 CT imaging fortunately shows no evidence of acute intra-abdominal process.  Likely is musculoskeletal strain secondary to coughing.  Did have some improvement after nebulizer.   [PR]  2213 Pt reassessed.  Denies any chest  pain at this time.  Feels much improved after nebulizer treatment.  Do feel he benefit from additional course of steroids but this time a longer taper.   [PR]  2220 Repeat troponin negative.  At this  point do believe he stable and appropriate for outpatient follow-up.   [PR]    Clinical Course User Index [PR] Merlyn Lot, MD     As part of my medical decision making, I reviewed the following data within the [place] notes reviewed and incorporated, Labs reviewed, notes from prior ED visits. ____________________________________________   FINAL CLINICAL IMPRESSION(S) / ED DIAGNOSES  Final diagnoses:  Cough  Chest pain, unspecified type  Right lower quadrant abdominal pain      NEW MEDICATIONS STARTED DURING THIS VISIT:  Discharge Medication List as of 04/26/2018 10:20 PM    START taking these medications   Details  azithromycin (ZITHROMAX) 500 MG tablet Take 1 tablet (500 mg total) by mouth daily for 3 days., Starting Tue 04/26/2018, Until Fri 04/29/2018, Print    !! chlorpheniramine-HYDROcodone (TUSSIONEX PENNKINETIC ER) 10-8 MG/5ML SUER Take 5 mLs by mouth at bedtime as needed for cough., Starting Tue 04/26/2018, Print    !! predniSONE (DELTASONE) 10 MG tablet Take 1 tablet (10 mg total) by mouth daily. Day 1-2: Take 50 mg  ( 5 pills) Day 3-4 : Take 40 mg (4pills) Day 5-6: Take 30 mg (3 pills) Day 7-8:  Take 20 mg (2 pills) Day 9:  Take 10mg  (1 pill), Starting Tue 04/26/2018, Print     !! - Potential duplicate medications found. Please discuss with provider.       Note:  This document was prepared using Dragon voice recognition software and may include unintentional dictation errors.    Merlyn Lot, MD 04/26/18 2242

## 2018-04-26 NOTE — ED Notes (Signed)
Patient transported to CT 

## 2018-08-25 ENCOUNTER — Telehealth: Payer: Self-pay | Admitting: Cardiovascular Disease

## 2018-08-25 NOTE — Telephone Encounter (Signed)
Virtual Visit Pre-Appointment Phone Call  Steps For Call:  Confirm consent - "In the setting of the current Covid19 crisis, you are scheduled for a (phone or video) visit with your provider on (date) at (time).  Just as we do with many in-office visits, in order for you to participate in this visit, we must obtain consent.  If you'd like, I can send this to your mychart (if signed up) or email for you to review.  Otherwise, I can obtain your verbal consent now.  All virtual visits are billed to your insurance company just like a normal visit would be.  By agreeing to a virtual visit, we'd like you to understand that the technology does not allow for your provider to perform an examination, and thus may limit your provider's ability to fully assess your condition.  Finally, though the technology is pretty good, we cannot assure that it will always work on either your or our end, and in the setting of a video visit, we may have to convert it to a phone-only visit.  In either situation, we cannot ensure that we have a secure connection.  Are you willing to proceed?" YES 1. Give patient instructions for WebEx download to smartphone as below if video visit  2. Advise patient to be prepared with any vital sign or heart rhythm information, their current medicines, and a piece of paper and pen handy for any instructions they may receive the day of their visit  3. Inform patient they will receive a phone call 15 minutes prior to their appointment time (may be from unknown caller ID) so they should be prepared to answer  4. Confirm that appointment type is correct in Epic appointment notes (video vs telephone)    TELEPHONE CALL NOTE  Brett Marshall has been deemed a candidate for a follow-up tele-health visit to limit community exposure during the Covid-19 pandemic. I spoke with the patient via phone to ensure availability of phone/video source, confirm preferred email & phone number, and discuss  instructions and expectations.  I reminded Brett Marshall to be prepared with any vital sign and/or heart rhythm information that could potentially be obtained via home monitoring, at the time of his visit. I reminded Brett Marshall to expect a phone call at the time of his visit if his visit.  Did the patient verbally acknowledge consent to treatment? Brett Marshall 08/25/2018 3:43 PM   DOWNLOADING THE [employer], go to [employer] and type in WebEx in the search bar. [place] [employer], the blue/green circle. The app is free but as with any other app downloads, their phone may require them to verify saved payment information or Apple password. The patient does NOT have to create an account.  - If Android, ask patient to go to [employer] and type in WebEx in the search bar. [place] [employer], the blue/green circle. The app is free but as with any other app downloads, their phone may require them to verify saved payment information or Android password. The patient does NOT have to create an account.   CONSENT FOR TELE-HEALTH VISIT - PLEASE REVIEW  I hereby voluntarily request, consent and authorize [facility] and its employed or contracted physicians, physician assistants, nurse practitioners or other licensed health care professionals (the Practitioner), to provide me with telemedicine health care services (the "Services") as deemed necessary by the treating Practitioner. I acknowledge and consent  to receive the Services by the Practitioner via telemedicine. I understand that the telemedicine visit will involve communicating with the Practitioner through live audiovisual communication technology and the disclosure of certain medical information by electronic transmission. I acknowledge that I have been given the opportunity to request an in-person assessment or other available alternative prior to the telemedicine visit and am  voluntarily participating in the telemedicine visit.  I understand that I have the right to withhold or withdraw my consent to the use of telemedicine in the course of my care at any time, without affecting my right to future care or treatment, and that the Practitioner or I may terminate the telemedicine visit at any time. I understand that I have the right to inspect all information obtained and/or recorded in the course of the telemedicine visit and may receive copies of available information for a reasonable fee.  I understand that some of the potential risks of receiving the Services via telemedicine include:  Marland Kitchen Delay or interruption in medical evaluation due to technological equipment failure or disruption; . Information transmitted may not be sufficient (e.g. poor resolution of images) to allow for appropriate medical decision making by the Practitioner; and/or  . In rare instances, security protocols could fail, causing a breach of personal health information.  Furthermore, I acknowledge that it is my responsibility to provide information about my medical history, conditions and care that is complete and accurate to the best of my ability. I acknowledge that Practitioner's advice, recommendations, and/or decision may be based on factors not within their control, such as incomplete or inaccurate data provided by me or distortions of diagnostic images or specimens that may result from electronic transmissions. I understand that the practice of medicine is not an exact science and that Practitioner makes no warranties or guarantees regarding treatment outcomes. I acknowledge that I will receive a copy of this consent concurrently upon execution via email to the email address I last provided but may also request a printed copy by calling the office of [facility].    I understand that my insurance will be billed for this visit.   I have read or had this consent read to me. . I understand the  contents of this consent, which adequately explains the benefits and risks of the Services being provided via telemedicine.  . I have been provided ample opportunity to ask questions regarding this consent and the Services and have had my questions answered to my satisfaction. . I give my informed consent for the services to be provided through the use of telemedicine in my medical care  By participating in this telemedicine visit I agree to the above.

## 2018-09-15 ENCOUNTER — Telehealth (INDEPENDENT_AMBULATORY_CARE_PROVIDER_SITE_OTHER): Payer: BC Managed Care – PPO | Admitting: Cardiovascular Disease

## 2018-09-15 ENCOUNTER — Other Ambulatory Visit: Payer: Self-pay

## 2018-09-15 ENCOUNTER — Encounter: Payer: Self-pay | Admitting: Cardiovascular Disease

## 2018-09-15 VITALS — BP 131/89 | HR 88 | Ht 68.0 in | Wt 359.0 lb

## 2018-09-15 DIAGNOSIS — I5022 Chronic systolic (congestive) heart failure: Secondary | ICD-10-CM | POA: Diagnosis not present

## 2018-09-15 NOTE — Progress Notes (Signed)
Virtual Visit via Video Note   This visit type was conducted due to national recommendations for restrictions regarding the COVID-19 Pandemic (e.g. social distancing) in an effort to limit this patient's exposure and mitigate transmission in our community.  Due to his co-morbid illnesses, this patient is at least at moderate risk for complications without adequate follow up.  This format is felt to be most appropriate for this patient at this time.  The patient did not have access to video technology/had technical difficulties with video requiring transitioning to audio format only (telephone).  All issues noted in this document were discussed and addressed.  No physical exam could be performed with this format.  Please refer to the patient's chart for his  consent to telehealth for [facility].   Evaluation Performed:  Follow-up visit  Date:  09/15/2018   ID:  Brett Marshall, DOB 1965/09/03, MRN [id number]  Patient Location: Home Provider Location: Office  PCP:  Brett Honour, MD  Cardiologist:  Brett Sacramento, MD  Electrophysiologist:  None   Chief Complaint:  f/u  History of Present Illness:    Brett Marshall is a 53 y.o. male who was evaluated by a video visit for follow-up visit regarding chronic systolic heart failure due to nonischemic cardiomyopathy.   The patient has multiple chronic medical conditions that include type 2 diabetes, essential hypertension, morbid obesity, obstructive sleep apnea on CPAP and degenerative disc disease.  Right and left cardiac catheterization done in April of 2019 showed mild nonobstructive coronary artery disease, mildly reduced LV systolic function with an EF of 45 to 50% with global hypokinesis.  Right heart catheterization showed moderately elevated filling pressures with a wedge pressure of 22 mmHg, mild pulmonary hypertension and high cardiac output.   He was referred to pulmonary for sleep apnea during last visit and was confirmed to  have obstructive sleep apnea.  He currently uses CPAP with significant improvement.  He reports stable exertional dyspnea with no leg edema.  He lost 12 pounds since last visit with improved lifestyle changes.  He continues to have intermittent atypical chest pain but no recent worsening.  He does complain of dizziness when he stands up quickly   The patient does not have symptoms concerning for COVID-19 infection (fever, chills, cough, or new shortness of breath).    Past Medical History:  Diagnosis Date  . Asthma   . CHF (congestive heart failure) ([place])   . DDD (degenerative disc disease), lumbar   . Depression   . Diabetes mellitus without complication ([place])   . Essential hypertension, benign   . Leg swelling    Left  . Low back pain   . OSA on CPAP    non compliant   Past Surgical History:  Procedure Laterality Date  . CARDIAC CATHETERIZATION     [place] no stents.  Marland Kitchen RIGHT/LEFT HEART CATH AND CORONARY ANGIOGRAPHY N/A 09/02/2017   Procedure: RIGHT/LEFT HEART CATH AND CORONARY ANGIOGRAPHY;  Surgeon: Wellington Hampshire, MD;  Location: [place] [facility];  Service: Cardiovascular;  Laterality: N/A;     Current Meds  Medication Sig  . atorvastatin (LIPITOR) 10 MG tablet Take 10 mg by mouth daily at 6 PM.   . cyclobenzaprine (FLEXERIL) 10 MG tablet Take 1 tablet (10 mg total) by mouth 3 (three) times daily as needed.For spasms  . DULoxetine (CYMBALTA) 30 MG capsule Take 1 capsule (30 mg total) by mouth daily.  . enalapril (VASOTEC) 20 MG tablet Take 1  tablet (20 mg total) by mouth 2 (two) times daily. (Patient taking differently: Take 40 mg by mouth every morning. )  . famotidine (PEPCID) 40 MG tablet Take 40 mg by mouth daily.   . furosemide (LASIX) 20 MG tablet Take 1 tablet (20 mg total) by mouth daily.  Marland Kitchen glipiZIDE (GLUCOTROL) 5 MG tablet Take 1 tablet (5 mg total) by mouth 2 (two) times daily before a meal.  . glucose blood (GE100 BLOOD GLUCOSE TEST) test strip Use as  instructed  . Lancets (ONETOUCH ULTRASOFT) lancets Check sugar twice daily  . metFORMIN (GLUCOPHAGE) 1000 MG tablet Take 1 tablet (1,000 mg total) by mouth 2 (two) times daily with a meal.     Allergies:   Patient has no known allergies.   Social History   Tobacco Use  . Smoking status: Never Smoker  . Smokeless tobacco: Never Used  Substance Use Topics  . Alcohol use: Yes    Comment: few times per year  . Drug use: No     Family Hx: The patient's family history includes Asthma in his mother; Diabetes in his mother; Heart attack in his father; Heart disease in his father; Heart disease (age of onset: 69) in his brother; Sjogren's syndrome in his sister.  ROS:   Please see the history of present illness.     All other systems reviewed and are negative.   Prior CV studies:   The following studies were reviewed today:    Labs/Other Tests and Data Reviewed:    EKG:  No ECG reviewed.  Recent Labs: 12/08/2017: ALT 32 04/26/2018: BUN 25; Creatinine, Ser 1.26; Hemoglobin 13.2; Platelets 264; Potassium 4.3; Sodium 136   Recent Lipid Panel Lab Results  Component Value Date/Time   CHOL 176 12/08/2017 11:50 AM   TRIG 143 12/08/2017 11:50 AM   HDL 44 12/08/2017 11:50 AM   CHOLHDL 4.0 12/08/2017 11:50 AM   CHOLHDL 3.5 01/11/2016 10:08 AM   LDLCALC 103 (H) 12/08/2017 11:50 AM    Wt Readings from Last 3 Encounters:  09/15/18 (!) 359 lb (162.8 kg)  04/26/18 (!) 371 lb (168.3 kg)  03/22/18 (!) 371 lb (168.3 kg)     Objective:    Vital Signs:  BP 131/89   Pulse 88   Ht 5' 8"  (1.727 m)   Wt (!) 359 lb (162.8 kg)   BMI 54.59 kg/m    VITAL SIGNS:  reviewed GEN:  no acute distress EYES:  sclerae anicteric, EOMI - Extraocular Movements Intact RESPIRATORY:  normal respiratory effort, symmetric expansion CARDIOVASCULAR:  no peripheral edema SKIN:  no rash, lesions or ulcers. MUSCULOSKELETAL:  no obvious deformities. NEURO:  alert and oriented x 3, no obvious focal  deficit PSYCH:  normal affect  ASSESSMENT & PLAN:    1.  Chronic systolic heart failure with mildly reduced LV systolic function due to nonischemic cardiomyopathy: EF of 45 to 50%.   Continue current dose of carvedilol and enalapril. Most recent labs in December showed mild volume depletion with slight elevation in creatinine and BUN.  It is possible that his orthostatic dizziness might reflect some degree of volume depletion. I instructed him to use furosemide as needed instead of daily based on symptoms, edema and weight change.  2.  Bilateral carotid bruits: Carotid Doppler showed normal carotid arteries.  3.  Essential hypertension: Blood pressure is controlled on current medications  4.    Mild hyperlipidemia: Currently on atorvastatin 10 mg daily given that he is diabetic  5.  Sleep apnea: Improved symptoms with CPAP  COVID-19 Education: The signs and symptoms of COVID-19 were discussed with the patient and how to seek care for testing (follow up with PCP or arrange E-visit).  The importance of social distancing was discussed today.  Time:   Today, I have spent 20 minutes with the patient with telehealth technology discussing the above problems.     Medication Adjustments/Labs and Tests Ordered: Current medicines are reviewed at length with the patient today.  Concerns regarding medicines are outlined above.   Tests Ordered: No orders of the defined types were placed in this encounter.   Medication Changes: No orders of the defined types were placed in this encounter.   Disposition:  Follow up in 6 month(s)  Signed, Brett Sacramento, MD  09/15/2018 1:02 PM    [facility]

## 2018-09-15 NOTE — Patient Instructions (Signed)
Medication Instructions:  Use furosemide 20 mg daily only as needed based on symptoms and weight gain  If you need a refill on your cardiac medications before your next appointment, please call your pharmacy.   Lab work: None If you have labs (blood work) drawn today and your tests are completely normal, you will receive your results only by: Marland Kitchen MyChart Message (if you have MyChart) OR . A paper copy in the mail If you have any lab test that is abnormal or we need to change your treatment, we will call you to review the results.  Testing/Procedures: None  Follow-Up: At [facility], you and your health needs are our priority.  As part of our continuing mission to provide you with exceptional heart care, we have created designated Provider Care Teams.  These Care Teams include your primary Cardiologist (physician) and Advanced Practice Providers (APPs -  Physician Assistants and Nurse Practitioners) who all work together to provide you with the care you need, when you need it. You will need a follow up appointment in 6 months.  Please call our office 2 months in advance to schedule this appointment.  You may see [name], MD or one of the following Advanced Practice Providers on your designated Care Team:   Murray Hodgkins, NP Christell Faith, PA-C . Marrianne Mood, PA-C

## 2018-10-15 ENCOUNTER — Emergency Department
Admission: EM | Admit: 2018-10-15 | Discharge: 2018-10-15 | Disposition: A | Discharge status: Home / Self Care | Payer: BC Managed Care – PPO | Attending: Emergency Medicine | Admitting: Emergency Medicine

## 2018-10-15 ENCOUNTER — Encounter: Payer: Self-pay | Admitting: Emergency Medicine

## 2018-10-15 ENCOUNTER — Emergency Department: Payer: BC Managed Care – PPO

## 2018-10-15 ENCOUNTER — Other Ambulatory Visit: Payer: Self-pay

## 2018-10-15 DIAGNOSIS — Y939 Activity, unspecified: Secondary | ICD-10-CM | POA: Insufficient documentation

## 2018-10-15 DIAGNOSIS — S99922A Unspecified injury of left foot, initial encounter: Secondary | ICD-10-CM | POA: Diagnosis present

## 2018-10-15 DIAGNOSIS — S90212A Contusion of left great toe with damage to nail, initial encounter: Secondary | ICD-10-CM | POA: Diagnosis not present

## 2018-10-15 DIAGNOSIS — Z79899 Other long term (current) drug therapy: Secondary | ICD-10-CM | POA: Diagnosis not present

## 2018-10-15 DIAGNOSIS — Y999 Unspecified external cause status: Secondary | ICD-10-CM | POA: Diagnosis not present

## 2018-10-15 DIAGNOSIS — W2209XA Striking against other stationary object, initial encounter: Secondary | ICD-10-CM | POA: Diagnosis not present

## 2018-10-15 DIAGNOSIS — I5022 Chronic systolic (congestive) heart failure: Secondary | ICD-10-CM | POA: Insufficient documentation

## 2018-10-15 DIAGNOSIS — E119 Type 2 diabetes mellitus without complications: Secondary | ICD-10-CM | POA: Diagnosis not present

## 2018-10-15 DIAGNOSIS — I110 Hypertensive heart disease with heart failure: Secondary | ICD-10-CM | POA: Insufficient documentation

## 2018-10-15 DIAGNOSIS — J45909 Unspecified asthma, uncomplicated: Secondary | ICD-10-CM | POA: Diagnosis not present

## 2018-10-15 DIAGNOSIS — Y929 Unspecified place or not applicable: Secondary | ICD-10-CM | POA: Insufficient documentation

## 2018-10-15 DIAGNOSIS — S90222A Contusion of left lesser toe(s) with damage to nail, initial encounter: Secondary | ICD-10-CM

## 2018-10-15 DIAGNOSIS — Z7984 Long term (current) use of oral hypoglycemic drugs: Secondary | ICD-10-CM | POA: Insufficient documentation

## 2018-10-15 MED ORDER — HYDROCODONE-ACETAMINOPHEN 5-325 MG PO TABS: 1.0000 | ORAL_TABLET | Freq: Four times a day (QID) | ORAL | 0 refills | Status: DC | PRN

## 2018-10-15 NOTE — ED Triage Notes (Signed)
L great toe pain and discoloration since jammed into object this am.

## 2018-10-15 NOTE — Discharge Instructions (Addendum)
Please take pain medication as prescribed as needed for pain.  Change Band-Aid as needed.  Return to the ER for any increasing pain swelling warmth or redness.

## 2018-10-15 NOTE — ED Provider Notes (Addendum)
[facility] EMERGENCY DEPARTMENT Provider Note   CSN: [id number] Arrival date & time: 10/15/18  1643    History   Chief Complaint Chief Complaint  Patient presents with  . Toe Pain    HPI Brett Marshall is a 53 y.o. male.  Presents the emerge department for evaluation of jamming his left great toe.  Patient jammed his great left toe this morning along a piece of wood.  Has some bleeding under the nail.  No nail injury.  Has been ambulatory with moderate to severe pain.  No other injury to his body.     HPI  Past Medical History:  Diagnosis Date  . Asthma   . CHF (congestive heart failure) ([place])   . DDD (degenerative disc disease), lumbar   . Depression   . Diabetes mellitus without complication ([place])   . Essential hypertension, benign   . Leg swelling    Left  . Low back pain   . OSA on CPAP    non compliant    Patient Active Problem List   Diagnosis Date Noted  . Chronic systolic congestive heart failure ([place]) 12/09/2017  . Pulmonary hypertension ([place]) 12/09/2017  . NASH (nonalcoholic steatohepatitis) 11/07/2017  . Essential hypertension 09/13/2016  . DISH (diffuse idiopathic skeletal hyperostosis) 05/17/2016  . DDD (degenerative disc disease), lumbar 05/17/2016  . Type 2 diabetes mellitus without complication, without long-term current use of insulin ([place]) 02/08/2016  . Gynecomastia 02/08/2016  . Morbid obesity ([place]) 02/08/2016    Past Surgical History:  Procedure Laterality Date  . CARDIAC CATHETERIZATION     [place] no stents.  Marland Kitchen RIGHT/LEFT HEART CATH AND CORONARY ANGIOGRAPHY N/A 09/02/2017   Procedure: RIGHT/LEFT HEART CATH AND CORONARY ANGIOGRAPHY;  Surgeon: Wellington Hampshire, MD;  Location: [place] [facility];  Service: Cardiovascular;  Laterality: N/A;        Home Medications    Prior to Admission medications   Medication Sig Start Date End Date Taking? Authorizing Provider  albuterol (PROVENTIL HFA;VENTOLIN HFA) 108  (90 Base) MCG/ACT inhaler Inhale 1-2 puffs into the lungs every 6 (six) hours as needed for wheezing or shortness of breath. 03/22/18   Coral Spikes, DO  atorvastatin (LIPITOR) 10 MG tablet Take 10 mg by mouth daily at 6 PM.  08/12/18 08/12/19  [provider]  carvedilol (COREG) 6.25 MG tablet Take 1 tablet (6.25 mg total) by mouth 2 (two) times daily. Patient taking differently: Take 12.5 mg by mouth 2 (two) times daily.  01/24/18 01/24/19  Wellington Hampshire, MD  cyclobenzaprine (FLEXERIL) 10 MG tablet Take 1 tablet (10 mg total) by mouth 3 (three) times daily as needed.For spasms 11/20/17   [name], MD  DULoxetine (CYMBALTA) 30 MG capsule Take 1 capsule (30 mg total) by mouth daily. 08/23/17   [name], MD  enalapril (VASOTEC) 20 MG tablet Take 1 tablet (20 mg total) by mouth 2 (two) times daily. Patient taking differently: Take 40 mg by mouth every morning.  11/05/17   [name], MD  famotidine (PEPCID) 40 MG tablet Take 40 mg by mouth daily.  08/14/18   [provider]  furosemide (LASIX) 20 MG tablet Take 1 tablet (20 mg total) by mouth daily. 09/17/17   Wellington Hampshire, MD  glipiZIDE (GLUCOTROL) 5 MG tablet Take 1 tablet (5 mg total) by mouth 2 (two) times daily before a meal. 12/09/17   [name], MD  glucose blood (GE100 BLOOD GLUCOSE  TEST) test strip Use as instructed 02/03/16   [name], MD  HYDROcodone-acetaminophen (NORCO) 5-325 MG tablet Take 1 tablet by mouth every 6 (six) hours as needed for moderate pain. 10/15/18   Duanne Guess, PA-C  Lancets [facility] ULTRASOFT) lancets Check sugar twice daily 01/11/16   [name], MD  metFORMIN (GLUCOPHAGE) 1000 MG tablet Take 1 tablet (1,000 mg total) by mouth 2 (two) times daily with a meal. 12/09/17   [name], MD    Family History Family History  Problem Relation Age of Onset  . Diabetes Mother   . Asthma Mother   . Heart disease Father   . Heart attack Father   . Sjogren's syndrome  Sister   . Heart disease Brother 65       CAD/CABG    Social History Social History   Tobacco Use  . Smoking status: Never Smoker  . Smokeless tobacco: Never Used  Substance Use Topics  . Alcohol use: Yes    Comment: few times per year  . Drug use: No     Allergies   Patient has no known allergies.   Review of Systems Review of Systems  Musculoskeletal: Positive for arthralgias. Negative for gait problem and joint swelling.  Skin: Positive for wound.     Physical Exam Updated Vital Signs BP 121/60   Pulse 96   Temp 98.7 F (37.1 C) (Oral)   Resp 20   Ht 5\' 8"  (1.727 m)   Wt (!) 163.7 kg   SpO2 95%   BMI 54.89 kg/m   Physical Exam Constitutional:      Appearance: He is well-developed.  HENT:     Head: Normocephalic and atraumatic.  Eyes:     Conjunctiva/sclera: Conjunctivae normal.  Neck:     Musculoskeletal: Normal range of motion.  Cardiovascular:     Rate and Rhythm: Normal rate.  Pulmonary:     Effort: Pulmonary effort is normal. No respiratory distress.  Musculoskeletal: Normal range of motion.     Comments: Left great toe with subungual hematoma.  Hematoma was drained with electrocautery state.  Band-Aid applied.  Pain alleviated.  Skin:    General: Skin is warm.     Findings: No rash.  Neurological:     Mental Status: He is alert and oriented to person, place, and time.  Psychiatric:        Behavior: Behavior normal.        Thought Content: Thought content normal.      ED Treatments / Results  Labs (all labs ordered are listed, but only abnormal results are displayed) Labs Reviewed - No data to display  EKG None  Radiology Dg Toe Great Left  Result Date: 10/15/2018 CLINICAL DATA:  Left great toe pain, jammed left great toe EXAM: LEFT GREAT TOE COMPARISON:  None. FINDINGS: There is no evidence of fracture or dislocation. There is no evidence of arthropathy or other focal bone abnormality. Soft tissues are unremarkable. IMPRESSION:  Negative. Electronically Signed   By: Rolm Baptise M.D.   On: 10/15/2018 18:36    Procedures .Marland KitchenIncision and Drainage Date/Time: 10/15/2018 8:20 PM Performed by: Duanne Guess, PA-C Authorized by: Duanne Guess, PA-C   Consent:    Consent obtained:  Verbal   Consent given by:  Patient Location:    Type:  Subungual hematoma Procedure type:    Complexity:  Simple Procedure details:    Incision depth:  Subungual   Drainage:  Bloody Comments:  Electrocautery state used to drain subungual hematoma to the left great toe.  Moderate drainage at achieved hematoma evacuated.  Pain improved.   (including critical care time)  Medications Ordered in ED Medications - No data to display   Initial Impression / Assessment and Plan / ED Course  I have reviewed the triage vital signs and the nursing notes.  Pertinent labs & imaging results that were available during my care of the patient were reviewed by me and considered in my medical decision making (see chart for details).        53 year old male with subungual hematoma to the left great toe.  Single hematoma drained.  He is given Norco for pain.  X-rays negative for any acute bony abnormality.  Final Clinical Impressions(s) / ED Diagnoses   Final diagnoses:  Subungual hematoma of toe of left foot, initial encounter    ED Discharge Orders         Ordered    HYDROcodone-acetaminophen (NORCO) 5-325 MG tablet  Every 6 hours PRN     10/15/18 2001           Duanne Guess, PA-C 10/15/18 2003    Duanne Guess, PA-C 10/15/18 2021    Duanne Guess, PA-C 10/15/18 2021    Harvest Dark, MD 10/15/18 2300

## 2018-10-24 ENCOUNTER — Other Ambulatory Visit: Payer: Self-pay | Admitting: Cardiovascular Disease

## 2019-03-01 ENCOUNTER — Other Ambulatory Visit: Payer: Self-pay

## 2019-03-01 ENCOUNTER — Telehealth: Payer: Self-pay

## 2019-03-01 DIAGNOSIS — Z1211 Encounter for screening for malignant neoplasm of colon: Secondary | ICD-10-CM

## 2019-03-01 MED ORDER — NA SULFATE-K SULFATE-MG SULF 17.5-3.13-1.6 GM/177ML PO SOLN: 1.0000 | Freq: Once | ORAL | 0 refills | Status: AC

## 2019-03-01 NOTE — Telephone Encounter (Signed)
Gastroenterology Pre-Procedure Review  Request Date: Tuesday 03/21/19 Requesting Physician: Dr. Allen Norris  PATIENT REVIEW QUESTIONS: The patient responded to the following health history questions as indicated:    1. Are you having any GI issues? yes (stomach ache) 2. Do you have a personal history of Polyps? no 3. Do you have a family history of Colon Cancer or Polyps? no 4. Diabetes Mellitus? yes (type 2 oral meds) 5. Joint replacements in the past 12 months?no 6. Major health problems in the past 3 months?no 7. Any artificial heart valves, MVP, or defibrillator?Heart Cath April 2019 with Dr. Bjorn Loser Sent    MEDICATIONS & ALLERGIES:    Patient reports the following regarding taking any anticoagulation/antiplatelet therapy:   Plavix, Coumadin, Eliquis, Xarelto, Lovenox, Pradaxa, Brilinta, or Effient? no Aspirin? no  Patient confirms/reports the following medications:  Current Outpatient Medications  Medication Sig Dispense Refill  . albuterol (PROVENTIL HFA;VENTOLIN HFA) 108 (90 Base) MCG/ACT inhaler Inhale 1-2 puffs into the lungs every 6 (six) hours as needed for wheezing or shortness of breath. 1 Inhaler 0  . atorvastatin (LIPITOR) 10 MG tablet Take 10 mg by mouth daily at 6 PM.     . carvedilol (COREG) 6.25 MG tablet Take 1 tablet (6.25 mg total) by mouth 2 (two) times daily. (Patient taking differently: Take 12.5 mg by mouth 2 (two) times daily. ) 60 tablet 5  . cyclobenzaprine (FLEXERIL) 10 MG tablet Take 1 tablet (10 mg total) by mouth 3 (three) times daily as needed.For spasms 90 tablet 5  . DULoxetine (CYMBALTA) 30 MG capsule Take 1 capsule (30 mg total) by mouth daily. 90 capsule 1  . enalapril (VASOTEC) 20 MG tablet Take 1 tablet (20 mg total) by mouth 2 (two) times daily. (Patient taking differently: Take 40 mg by mouth every morning. ) 60 tablet 5  . famotidine (PEPCID) 40 MG tablet Take 40 mg by mouth daily.     . furosemide (LASIX) 20 MG tablet Take 1 tablet  (20 mg total) by mouth daily. 90 tablet 3  . glipiZIDE (GLUCOTROL) 5 MG tablet Take 1 tablet (5 mg total) by mouth 2 (two) times daily before a meal. 180 tablet 1  . glucose blood (GE100 BLOOD GLUCOSE TEST) test strip Use as instructed 100 each 12  . HYDROcodone-acetaminophen (NORCO) 5-325 MG tablet Take 1 tablet by mouth every 6 (six) hours as needed for moderate pain. 15 tablet 0  . Lancets (ONETOUCH ULTRASOFT) lancets Check sugar twice daily 100 each 12  . metFORMIN (GLUCOPHAGE) 1000 MG tablet Take 1 tablet (1,000 mg total) by mouth 2 (two) times daily with a meal. 180 tablet 1   No current facility-administered medications for this visit.     Patient confirms/reports the following allergies:  No Known Allergies  No orders of the defined types were placed in this encounter.   AUTHORIZATION INFORMATION Primary Insurance: 1D#: Group #:  Secondary Insurance: 1D#: Group #:  SCHEDULE INFORMATION: Date: 03/21/19 Time: Location:[facility]

## 2019-03-03 ENCOUNTER — Telehealth: Payer: Self-pay | Admitting: Cardiovascular Disease

## 2019-03-03 NOTE — Telephone Encounter (Signed)
° °  [facility] Pre-operative Risk Assessment    Request for surgical clearance:  1. What type of surgery is being performed? Colonoscopy   2. When is this surgery scheduled? 03/21/19   3. What type of clearance is required (medical clearance vs. Pharmacy clearance to hold med vs. Both)? Medical   4. Are there any medications that need to be held prior to surgery and how long?not noted    5. Practice name and name of physician performing surgery? [facility] Dr. Allen Norris   6. What is your office phone [phone]    7.   What is your office fax number  [phone]   8.   Anesthesia type (None, local, MAC, general) ? Not noted    Clarisse Gouge 03/03/2019, 9:17 AM  _________________________________________________________________   (provider comments below)

## 2019-03-06 NOTE — Telephone Encounter (Signed)
New Message   Patient returning your call.

## 2019-03-06 NOTE — Telephone Encounter (Signed)
Left voicemail again.

## 2019-03-06 NOTE — Telephone Encounter (Signed)
   Primary Cardiologist:Muhammad Fletcher Anon, MD  Chart reviewed as part of pre-operative protocol coverage. Patient is having chest tightness and dizziness with bending. Needs office visit for further evaluation with orthostatic check.   Pre-op covering staff: - Please schedule appointment and call patient to inform them. - Please contact requesting surgeon's office via preferred method (i.e, phone, fax) to inform them of need for appointment prior to surgery.  [place], [place]  03/06/2019, 3:10 PM

## 2019-03-06 NOTE — Telephone Encounter (Signed)
Called patient and he is agreeable to come in tomorrow to see Thurmond Butts. He is aware of process of coming in through the [place].

## 2019-03-06 NOTE — Telephone Encounter (Signed)
Will assess patient at time of office visit.

## 2019-03-06 NOTE — Telephone Encounter (Signed)
Left voice mail to call back 

## 2019-03-07 ENCOUNTER — Encounter: Payer: Self-pay | Admitting: Physician Assistant

## 2019-03-07 ENCOUNTER — Other Ambulatory Visit
Admission: RE | Admit: 2019-03-07 | Discharge: 2019-03-07 | Disposition: A | Discharge status: Home / Self Care | Payer: BC Managed Care – PPO | Source: Ambulatory Visit | Attending: Physician Assistant | Admitting: Physician Assistant

## 2019-03-07 ENCOUNTER — Other Ambulatory Visit: Payer: Self-pay

## 2019-03-07 ENCOUNTER — Ambulatory Visit (INDEPENDENT_AMBULATORY_CARE_PROVIDER_SITE_OTHER): Payer: BC Managed Care – PPO | Admitting: Physician Assistant

## 2019-03-07 VITALS — BP 120/90 | HR 98 | Temp 97.9°F | Ht 68.0 in | Wt 359.5 lb

## 2019-03-07 DIAGNOSIS — I5022 Chronic systolic (congestive) heart failure: Secondary | ICD-10-CM

## 2019-03-07 DIAGNOSIS — I10 Essential (primary) hypertension: Secondary | ICD-10-CM

## 2019-03-07 DIAGNOSIS — R0609 Other forms of dyspnea: Secondary | ICD-10-CM

## 2019-03-07 DIAGNOSIS — G4733 Obstructive sleep apnea (adult) (pediatric): Secondary | ICD-10-CM

## 2019-03-07 DIAGNOSIS — Z01810 Encounter for preprocedural cardiovascular examination: Secondary | ICD-10-CM

## 2019-03-07 DIAGNOSIS — Z9989 Dependence on other enabling machines and devices: Secondary | ICD-10-CM

## 2019-03-07 DIAGNOSIS — E785 Hyperlipidemia, unspecified: Secondary | ICD-10-CM

## 2019-03-07 DIAGNOSIS — I951 Orthostatic hypotension: Secondary | ICD-10-CM | POA: Diagnosis not present

## 2019-03-07 LAB — BASIC METABOLIC PANEL
Anion gap: 12 (ref 5–15)
BUN: 25 mg/dL — ABNORMAL HIGH (ref 6–20)
CO2: 22 mmol/L (ref 22–32)
Calcium: 9.4 mg/dL (ref 8.9–10.3)
Chloride: 101 mmol/L (ref 98–111)
Creatinine, Ser: 1.36 mg/dL — ABNORMAL HIGH (ref 0.61–1.24)
GFR calc Af Amer: >60 mL/min (ref >60)
GFR calc non Af Amer: 59 mL/min — ABNORMAL LOW (ref >60)
Glucose, Bld: 156 mg/dL — ABNORMAL HIGH (ref 70–99)
Potassium: 4.8 mmol/L (ref 3.5–5.1)
Sodium: 135 mmol/L (ref 135–145)

## 2019-03-07 MED ORDER — FUROSEMIDE 20 MG PO TABS: 20.0000 mg | ORAL_TABLET | ORAL | 3 refills | Status: DC | PRN

## 2019-03-07 NOTE — Patient Instructions (Signed)
Medication Instructions:  1- DECREASE Lasix to Take 1 tablet (20 mg total) by mouth as needed (for SOB or weight gain of 3 lbs overnight. *If you need a refill on your cardiac medications before your next appointment, please call your pharmacy*  Lab Work: Your physician recommends that you return for lab work today at the medical mall. No appt is needed. Hours are M-F 7AM- 6 PM.  If you have labs (blood work) drawn today and your tests are completely normal, you will receive your results only by: Marland Kitchen MyChart Message (if you have MyChart) OR . A paper copy in the mail If you have any lab test that is abnormal or we need to change your treatment, we will call you to review the results.  Testing/Procedures: None ordered   Follow-Up: At [facility], you and your health needs are our priority.  As part of our continuing mission to provide you with exceptional heart care, we have created designated Provider Care Teams.  These Care Teams include your primary Cardiologist (physician) and Advanced Practice Providers (APPs -  Physician Assistants and Nurse Practitioners) who all work together to provide you with the care you need, when you need it.  Your next appointment:   3 months  The format for your next appointment:   In Person  Provider:    You may see [name], MD or Christell Faith, PA-C.

## 2019-03-07 NOTE — Progress Notes (Signed)
Cardiology Office Note    Date:  03/07/2019   ID:  Brett Marshall, DOB 1965-12-06, MRN [id number]  PCP:  [name], MD  Cardiologist:  [name], MD  Electrophysiologist:  None   Chief Complaint: Preprocedure cardiac evaluation  History of Present Illness:   Brett Marshall is a 53 y.o. male with history of HFrEF secondary to NICM, DM2, HTN, morbid obesity, OSA on CPAP, and DDD who presents for preprocedure cardiac evaluation for colonoscopy.  Prior right and left cardiac cath in 08/2017 showed mild nonobstructive CAD, mildly reduced LV systolic function with an EF of 45 to 50%, global hypokinesis.  Right heart cath showed moderately elevated filling pressures with a wedge pressure of 22 mmHg, mild pulmonary hypertension, and high cardiac output.  He was subsequently referred to pulmonology and found to have sleep apnea confirmed by sleep study.  With use of CPAP he noted significant improvement in symptoms.  He was last seen virtually on 09/15/2018 and reported stable exertional dyspnea without leg edema.  He had lost 12 pounds since his last visit with improved lifestyle changes.  Documented home weight at that time was 359 pounds.  He continued to have intermittent atypical chest pain that was stable.  He did note dizziness when standing up quickly.  He was continued on current doses of carvedilol and enalapril.  Labs were reviewed from 04/2018 which showed mild volume depletion with slight elevation in BUN/serum creatinine.  It was felt that his orthostatic dizziness may reflect some degree of volume depletion.  He was advised to use furosemide on an as-needed basis rather than daily.  Patient comes in doing reasonably well from a cardiac perspective.  He is scheduled to undergo screening colonoscopy on 03/21/2019.  As part of our preoperative screening process given patient's orthostatic dizziness and shortness of breath it was recommended he come in for evaluation today.  Since  patient was last seen virtually in 08/2018 he has not changed his Lasix to as needed dosing and has continued to take this daily.  He has continued to note positional dizziness, particularly when he leans over to pick something up and stands up quickly.  In this setting, he has noted some increased shortness of breath when bending over as well.  He denies any chest pain and states "it is like I just cannot get a full breath when bending over."  He denies any presyncope or syncope.  No chest pain or palpitations.  He has not yet taken any of his antihypertensives today with a documented blood pressure at triage of 120/90 and noted positive orthostatics in the office as outlined below.  Prior carotid artery ultrasound showed no significant stenosis bilaterally with antegrade flow of the bilateral vertebral arteries and normal flow hemodynamics of the bilateral subclavian arteries in 08/2017.   Labs: 02/2019 - A1c 7.7 10/2018 - WBC 11.4, Hgb 14.1, PLT 274, potassium 4.7, BUN 29, serum creatinine 1.4, albumin 3.8, AST/ALT normal 07/2018 - total cholesterol 184, triglyceride 128, HDL 39, LDL 119, TSH normal  Past Medical History:  Diagnosis Date  . Asthma   . CHF (congestive heart failure) ([place])   . DDD (degenerative disc disease), lumbar   . Depression   . Diabetes mellitus without complication ([place])   . Essential hypertension, benign   . Leg swelling    Left  . Low back pain   . OSA on CPAP    non compliant    Past Surgical History:  Procedure  Laterality Date  . CARDIAC CATHETERIZATION     [place] no stents.  Marland Kitchen RIGHT/LEFT HEART CATH AND CORONARY ANGIOGRAPHY N/A 09/02/2017   Procedure: RIGHT/LEFT HEART CATH AND CORONARY ANGIOGRAPHY;  Surgeon: Wellington Hampshire, MD;  Location: [facility];  Service: Cardiovascular;  Laterality: N/A;    Current Medications: Current Meds  Medication Sig  . albuterol (PROVENTIL HFA;VENTOLIN HFA) 108 (90 Base) MCG/ACT inhaler Inhale 1-2 puffs into the  lungs every 6 (six) hours as needed for wheezing or shortness of breath.  Marland Kitchen atorvastatin (LIPITOR) 10 MG tablet Take 10 mg by mouth daily at 6 PM.   . carvedilol (COREG) 6.25 MG tablet Take 1 tablet (6.25 mg total) by mouth 2 (two) times daily. (Patient taking differently: Take 12.5 mg by mouth 2 (two) times daily. )  . cyclobenzaprine (FLEXERIL) 10 MG tablet Take 1 tablet (10 mg total) by mouth 3 (three) times daily as needed.For spasms  . DULoxetine (CYMBALTA) 30 MG capsule Take 1 capsule (30 mg total) by mouth daily.  . empagliflozin (JARDIANCE) 25 MG TABS tablet Take 25 mg by mouth daily.   . enalapril (VASOTEC) 20 MG tablet Take 1 tablet (20 mg total) by mouth 2 (two) times daily. (Patient taking differently: Take 40 mg by mouth every morning. )  . famotidine (PEPCID) 40 MG tablet Take 40 mg by mouth daily.   . furosemide (LASIX) 20 MG tablet Take 1 tablet (20 mg total) by mouth daily.  Marland Kitchen glipiZIDE (GLUCOTROL) 5 MG tablet Take 1 tablet (5 mg total) by mouth 2 (two) times daily before a meal.  . glucose blood (GE100 BLOOD GLUCOSE TEST) test strip Use as instructed  . HYDROcodone-acetaminophen (NORCO) 5-325 MG tablet Take 1 tablet by mouth every 6 (six) hours as needed for moderate pain.  . Lancets (ONETOUCH ULTRASOFT) lancets Check sugar twice daily  . metFORMIN (GLUCOPHAGE) 1000 MG tablet Take 1 tablet (1,000 mg total) by mouth 2 (two) times daily with a meal.    Allergies:   Patient has no known allergies.   Social History   Socioeconomic History  . Marital status: Married    Spouse name: Not on file  . Number of children: Not on file  . Years of education: Not on file  . Highest education level: Not on file  Occupational History  . Occupation: Chemical engineer: [employer]  Social Needs  . Financial resource strain: Not on file  . Food insecurity    Worry: Not on file    Inability: Not on file  . Transportation needs    Medical: Not on file    Non-medical: Not on  file  Tobacco Use  . Smoking status: Never Smoker  . Smokeless tobacco: Never Used  Substance and Sexual Activity  . Alcohol use: Yes    Comment: few times per year  . Drug use: No  . Sexual activity: Yes  Lifestyle  . Physical activity    Days per week: Not on file    Minutes per session: Not on file  . Stress: Not on file  Relationships  . Social Herbalist on phone: Not on file    Gets together: Not on file    Attends religious service: Not on file    Active member of club or organization: Not on file    Attends meetings of clubs or organizations: Not on file    Relationship status: Not on file  Other Topics Concern  .  Not on file  Social History Narrative   Goes to gym.     Family History:  The patient's family history includes Asthma in his mother; Diabetes in his mother; Heart attack in his father; Heart disease in his father; Heart disease (age of onset: 21) in his brother; Sjogren's syndrome in his sister.  ROS:   Review of Systems  Constitutional: Positive for malaise/fatigue. Negative for chills, diaphoresis, fever and weight loss.  HENT: Negative for congestion.   Eyes: Negative for discharge and redness.  Respiratory: Positive for shortness of breath. Negative for cough, hemoptysis, sputum production and wheezing.   Cardiovascular: Negative for chest pain, palpitations, orthopnea, claudication, leg swelling and PND.  Gastrointestinal: Negative for abdominal pain, blood in stool, heartburn, melena, nausea and vomiting.  Genitourinary: Negative for hematuria.  Musculoskeletal: Negative for falls and myalgias.  Skin: Negative for rash.  Neurological: Positive for dizziness. Negative for tingling, tremors, sensory change, speech change, focal weakness, loss of consciousness and weakness.  Endo/Heme/Allergies: Does not bruise/bleed easily.  Psychiatric/Behavioral: Negative for substance abuse. The patient is not nervous/anxious.   All other systems  reviewed and are negative.    EKGs/Labs/Other Studies Reviewed:    Studies reviewed were summarized above. The additional studies were reviewed today: as above  EKG:  EKG is ordered today.  The EKG ordered today demonstrates NSR, 98 bpm, no acute ST-T changes, unchanged from prior  Recent Labs: 04/26/2018: BUN 25; Creatinine, Ser 1.26; Hemoglobin 13.2; Platelets 264; Potassium 4.3; Sodium 136  Recent Lipid Panel    Component Value Date/Time   CHOL 176 12/08/2017 1150   TRIG 143 12/08/2017 1150   HDL 44 12/08/2017 1150   CHOLHDL 4.0 12/08/2017 1150   CHOLHDL 3.5 01/11/2016 1008   VLDL 17 01/11/2016 1008   LDLCALC 103 (H) 12/08/2017 1150    PHYSICAL EXAM:    VS:  BP 120/90 (BP Location: Left Arm, Patient Position: Sitting, Cuff Size: Normal)   Pulse 98   Temp 97.9 F (36.6 C)   Ht 5\' 8"  (1.727 m)   Wt (!) 359 lb 8 oz (163.1 kg)   SpO2 98%   BMI 54.66 kg/m   BMI: Body mass index is 54.66 kg/m.  Physical Exam  Constitutional: He is oriented to person, place, and time. He appears well-developed and well-nourished.  HENT:  Head: Normocephalic and atraumatic.  Eyes: Right eye exhibits no discharge. Left eye exhibits no discharge.  Neck: Normal range of motion. No JVD present.  Cardiovascular: Normal rate, regular rhythm, S1 normal, S2 normal and normal heart sounds. Exam reveals no distant heart sounds, no friction rub, no midsystolic click and no opening snap.  No murmur heard. Pulses:      Posterior tibial pulses are 2+ on the right side and 2+ on the left side.  Pulmonary/Chest: Effort normal and breath sounds normal. No respiratory distress. He has no decreased breath sounds. He has no wheezes. He has no rales. He exhibits no tenderness.  Abdominal: Soft. He exhibits no distension. There is no abdominal tenderness.  Musculoskeletal:        General: No edema.  Neurological: He is alert and oriented to person, place, and time.  Skin: Skin is warm and dry. No cyanosis.  Nails show no clubbing.  Psychiatric: He has a normal mood and affect. His speech is normal and behavior is normal. Judgment and thought content normal.  Vitals reviewed.   Wt Readings from Last 3 Encounters:  03/07/19 (!) 359 lb 8 oz (163.1  kg)  10/15/18 (!) 361 lb (163.7 kg)  09/15/18 (!) 359 lb (162.8 kg)     Orthostatic vital signs: Lying: 120/74, 96 bpm Sitting: 118/90, 104 bpm Standing: 100/64, 110 bpm, lightheaded Standing x3 minutes: 110/74, 110 bpm  ASSESSMENT & PLAN:   1. Preprocedure cardiac evaluation: Patient is scheduled for low risk screening colonoscopy on 03/21/2019.  He is able to achieve greater than 4 METs without cardiac limitation.  He does note positional orthostasis and bendopnea which are longstanding issues.  He was previously advised to transition from daily Lasix to just as needed though did not make this change.  I suspect there is some degree of volume depletion given his positive orthostatics in the office today playing a role in his positional dizziness.  I suspect there is a strong component of his morbid obesity playing a role in his bendopnea.  Per Revised Cardiac Index he is low risk for noncardiac procedure.  No further testing is needed at this time.  2. HFrEF secondary to NICM/bendopnea: Volume assessment is somewhat difficult on exam given his body habitus.  That said his weight is stable when compared to his last telehealth visit weight and is down from his prior clinic weight.  Orthostatics indicate some degree of volume depletion.  It was previously recommended he transition from Lasix daily to as needed he did not make this change.  Again it is recommended he take Lasix on an as-needed basis for increase shortness of breath or weight gain greater than 3 pounds overnight.  For now, he will continue current doses of carvedilol and enalapril.  With regards to his bendopnea, I suspect this is in the setting of his morbid obesity with weight loss being  advised rather than decompensated heart failure.  He has no symptoms suggestive of angina and recently had a right and left cardiac cath which demonstrated mild nonobstructive disease as outlined above.  No indication for further ischemic evaluation at this time.  3. Hypertension: Blood pressure is well controlled today despite not having any antihypertensive medication on board.  Continue medications as outlined above.  4. Hyperlipidemia: Remains on atorvastatin given he is a diabetic.  5. Morbid obesity with sleep apnea and likely OHS: Continue CPAP.  Weight loss is advised.  Disposition: F/u with Dr. Fletcher Anon or an APP in 3 months.   Medication Adjustments/Labs and Tests Ordered: Current medicines are reviewed at length with the patient today.  Concerns regarding medicines are outlined above. Medication changes, Labs and Tests ordered today are summarized above and listed in the Patient Instructions accessible in Encounters.   Signed, Christell Faith, PA-C 03/07/2019 3:34 PM     [facility] [address], [place] [address] [phone]

## 2019-03-07 NOTE — Telephone Encounter (Signed)
Pt has appt today with Christell Faith, PAC. I will forward clearance to PA for today's appt. I will remove from the pre op call back pool .

## 2019-03-10 ENCOUNTER — Other Ambulatory Visit: Payer: Self-pay

## 2019-03-10 DIAGNOSIS — Z20822 Contact with and (suspected) exposure to covid-19: Secondary | ICD-10-CM

## 2019-03-12 LAB — NOVEL CORONAVIRUS, NAA: SARS-CoV-2, NAA: NOT DETECTED

## 2019-03-17 ENCOUNTER — Other Ambulatory Visit
Admission: RE | Admit: 2019-03-17 | Discharge: 2019-03-17 | Disposition: A | Discharge status: Home / Self Care | Payer: BC Managed Care – PPO | Source: Ambulatory Visit | Attending: Gastroenterology | Admitting: Gastroenterology

## 2019-03-17 ENCOUNTER — Other Ambulatory Visit: Payer: Self-pay

## 2019-03-17 DIAGNOSIS — Z01812 Encounter for preprocedural laboratory examination: Secondary | ICD-10-CM | POA: Diagnosis present

## 2019-03-17 DIAGNOSIS — Z20828 Contact with and (suspected) exposure to other viral communicable diseases: Secondary | ICD-10-CM | POA: Insufficient documentation

## 2019-03-17 LAB — SARS CORONAVIRUS 2 (TAT 6-24 HRS): SARS Coronavirus 2: NEGATIVE

## 2019-03-20 MED ORDER — SODIUM CHLORIDE 0.9 % IV SOLN: INTRAVENOUS | Status: DC

## 2019-03-21 ENCOUNTER — Encounter
Admission: RE | Disposition: A | Discharge status: Home / Self Care | Payer: Self-pay | Source: Home / Self Care | Attending: Gastroenterology

## 2019-03-21 ENCOUNTER — Other Ambulatory Visit: Payer: Self-pay

## 2019-03-21 ENCOUNTER — Encounter: Payer: Self-pay | Admitting: *Deleted

## 2019-03-21 ENCOUNTER — Encounter: Payer: Self-pay | Admitting: Anesthesiology

## 2019-03-21 ENCOUNTER — Ambulatory Visit
Admission: RE | Admit: 2019-03-21 | Discharge: 2019-03-21 | Disposition: A | Discharge status: Home / Self Care | Payer: BC Managed Care – PPO | Attending: Gastroenterology | Admitting: Gastroenterology

## 2019-03-21 DIAGNOSIS — Z1211 Encounter for screening for malignant neoplasm of colon: Secondary | ICD-10-CM

## 2019-03-21 DIAGNOSIS — Z538 Procedure and treatment not carried out for other reasons: Secondary | ICD-10-CM | POA: Diagnosis not present

## 2019-03-21 HISTORY — DX: Pulmonary hypertension, unspecified: I27.20

## 2019-03-21 SURGERY — COLONOSCOPY WITH PROPOFOL
Anesthesia: General

## 2019-03-21 NOTE — Progress Notes (Signed)
Poor prep will reschedule

## 2019-03-22 ENCOUNTER — Other Ambulatory Visit: Payer: Self-pay

## 2019-03-22 DIAGNOSIS — Z1211 Encounter for screening for malignant neoplasm of colon: Secondary | ICD-10-CM

## 2019-03-29 ENCOUNTER — Encounter: Payer: Self-pay | Admitting: Emergency Medicine

## 2019-03-29 ENCOUNTER — Emergency Department
Admission: EM | Admit: 2019-03-29 | Discharge: 2019-03-29 | Disposition: A | Discharge status: Home / Self Care | Payer: BC Managed Care – PPO | Attending: Emergency Medicine | Admitting: Emergency Medicine

## 2019-03-29 ENCOUNTER — Other Ambulatory Visit: Payer: Self-pay

## 2019-03-29 ENCOUNTER — Emergency Department: Payer: BC Managed Care – PPO

## 2019-03-29 DIAGNOSIS — I5022 Chronic systolic (congestive) heart failure: Secondary | ICD-10-CM | POA: Diagnosis not present

## 2019-03-29 DIAGNOSIS — E119 Type 2 diabetes mellitus without complications: Secondary | ICD-10-CM | POA: Diagnosis not present

## 2019-03-29 DIAGNOSIS — R0789 Other chest pain: Secondary | ICD-10-CM | POA: Insufficient documentation

## 2019-03-29 DIAGNOSIS — Z7984 Long term (current) use of oral hypoglycemic drugs: Secondary | ICD-10-CM | POA: Diagnosis not present

## 2019-03-29 DIAGNOSIS — J45909 Unspecified asthma, uncomplicated: Secondary | ICD-10-CM | POA: Insufficient documentation

## 2019-03-29 DIAGNOSIS — I110 Hypertensive heart disease with heart failure: Secondary | ICD-10-CM | POA: Diagnosis not present

## 2019-03-29 LAB — CBC
HCT: 40.4 % (ref 39.0–52.0)
Hemoglobin: 13.5 g/dL (ref 13.0–17.0)
MCH: 28.8 pg (ref 26.0–34.0)
MCHC: 33.4 g/dL (ref 30.0–36.0)
MCV: 86.1 fL (ref 80.0–100.0)
Platelets: 241 10*3/uL (ref 150–400)
RBC: 4.69 MIL/uL (ref 4.22–5.81)
RDW: 13.8 % (ref 11.5–15.5)
WBC: 7.7 10*3/uL (ref 4.0–10.5)
nRBC: 0 % (ref 0.0–0.2)

## 2019-03-29 LAB — BASIC METABOLIC PANEL
Anion gap: 8 (ref 5–15)
BUN: 19 mg/dL (ref 6–20)
CO2: 25 mmol/L (ref 22–32)
Calcium: 9.2 mg/dL (ref 8.9–10.3)
Chloride: 103 mmol/L (ref 98–111)
Creatinine, Ser: 1.13 mg/dL (ref 0.61–1.24)
GFR calc Af Amer: >60 mL/min (ref >60)
GFR calc non Af Amer: >60 mL/min (ref >60)
Glucose, Bld: 184 mg/dL — ABNORMAL HIGH (ref 70–99)
Potassium: 4.7 mmol/L (ref 3.5–5.1)
Sodium: 136 mmol/L (ref 135–145)

## 2019-03-29 LAB — TROPONIN I (HIGH SENSITIVITY)
Troponin I (High Sensitivity): 4 ng/L (ref <18)
Troponin I (High Sensitivity): 3 ng/L (ref <18)

## 2019-03-29 LAB — FIBRIN DERIVATIVES D-DIMER (ARMC ONLY): Fibrin derivatives D-dimer (ARMC): 489.75 ng/mL (FEU) (ref 0.00–499.00)

## 2019-03-29 MED ORDER — SODIUM CHLORIDE 0.9% FLUSH: 3.0000 mL | Freq: Once | INTRAVENOUS | Status: DC

## 2019-03-29 NOTE — ED Notes (Signed)
Pt c/o intermittent L sided CP that started several days ago, pt states pain intermittently radiates into L arm at this time. Pt states takes daily weights at home, is followed by Dr. Fletcher Anon (cardiology). Pt denies pain with palpation at this time. Pt states pain worse with deep breath (intermittently). Pt able to speak in complete sentences at this time, A&O x 4, respirations even and unlabored, skin warm, dry, and intact.

## 2019-03-29 NOTE — Discharge Instructions (Addendum)
Your work-up was reassuring with negative cardiac markers and negative D-dimer to rule out for blood clot.  Unclear exactly what is causing this chest pain but I do not think it is life-threatening.  You should return to the ER for any other concerns and otherwise follow-up with your cardiologist.

## 2019-03-29 NOTE — ED Provider Notes (Signed)
[facility] Emergency Department Provider Note  ____________________________________________   First MD Initiated Contact with Patient 03/29/19 1841     (approximate)  I have reviewed the triage vital signs and the nursing notes.   HISTORY  Chief Complaint Chest Pain    HPI Brett Marshall is a 53 y.o. male with nonischemic cardiomyopathy, diabetes, pulmonary hypertension who presents with left-sided chest pain.  Patient states that he has had intermittent left-sided chest pain for the past 3 days.  The chest pain lasted few seconds, stabbing sensation, occasionally goes into his shoulder, nothing makes it better gets better on its own, worse with taking a deep breath.  Nonexertional in nature.  Denies any cough or fevers.  Patient denies any other risk factors for PE.  Patient had cardiac catheterization on 08/2017 without significant stenosis.   Ost LAD lesion is 30% stenosed.  Mid LAD lesion is 20% stenosed.  Dist LAD lesion is 30% stenosed.  Prox RCA lesion is 40% stenosed.          Past Medical History:  Diagnosis Date  . Asthma   . CHF (congestive heart failure) ([place])   . DDD (degenerative disc disease), lumbar   . Depression   . Diabetes mellitus without complication ([place])   . Essential hypertension, benign   . Leg swelling    Left  . Low back pain   . OSA on CPAP    non compliant  . Pulmonary hypertension [facility])     Patient Active Problem List   Diagnosis Date Noted  . Chronic systolic congestive heart failure ([place]) 12/09/2017  . Pulmonary hypertension ([place]) 12/09/2017  . NASH (nonalcoholic steatohepatitis) 11/07/2017  . Essential hypertension 09/13/2016  . DISH (diffuse idiopathic skeletal hyperostosis) 05/17/2016  . DDD (degenerative disc disease), lumbar 05/17/2016  . Type 2 diabetes mellitus without complication, without long-term current use of insulin ([place]) 02/08/2016  . Gynecomastia 02/08/2016  . Morbid obesity ([place])  02/08/2016    Past Surgical History:  Procedure Laterality Date  . CARDIAC CATHETERIZATION     [place] no stents.  Marland Kitchen RIGHT/LEFT HEART CATH AND CORONARY ANGIOGRAPHY N/A 09/02/2017   Procedure: RIGHT/LEFT HEART CATH AND CORONARY ANGIOGRAPHY;  Surgeon: Wellington Hampshire, MD;  Location: [place] [facility];  Service: Cardiovascular;  Laterality: N/A;    Prior to Admission medications   Medication Sig Start Date End Date Taking? Authorizing Provider  albuterol (PROVENTIL HFA;VENTOLIN HFA) 108 (90 Base) MCG/ACT inhaler Inhale 1-2 puffs into the lungs every 6 (six) hours as needed for wheezing or shortness of breath. 03/22/18   Coral Spikes, DO  atorvastatin (LIPITOR) 10 MG tablet Take 10 mg by mouth daily at 6 PM.  08/12/18 08/12/19  [provider]  carvedilol (COREG) 6.25 MG tablet Take 1 tablet (6.25 mg total) by mouth 2 (two) times daily. Patient taking differently: Take 12.5 mg by mouth 2 (two) times daily.  01/24/18 03/07/19  Wellington Hampshire, MD  cyclobenzaprine (FLEXERIL) 10 MG tablet Take 1 tablet (10 mg total) by mouth 3 (three) times daily as needed.For spasms 11/20/17   [name], MD  DULoxetine (CYMBALTA) 30 MG capsule Take 1 capsule (30 mg total) by mouth daily. 08/23/17   [name], MD  empagliflozin (JARDIANCE) 25 MG TABS tablet Take 25 mg by mouth daily.  02/21/19 02/21/20  [provider]  enalapril (VASOTEC) 20 MG tablet Take 1 tablet (20 mg total) by mouth 2 (two) times daily. Patient taking differently:  Take 40 mg by mouth every morning.  11/05/17   [name], MD  famotidine (PEPCID) 40 MG tablet Take 40 mg by mouth daily.  08/14/18   [provider]  furosemide (LASIX) 20 MG tablet Take 1 tablet (20 mg total) by mouth as needed (for SOB or weight gain of 3 lbs overnight). 03/07/19   Dunn, Areta Haber, PA-C  glipiZIDE (GLUCOTROL) 5 MG tablet Take 1 tablet (5 mg total) by mouth 2 (two) times daily before a meal. 12/09/17   [name], MD   glucose blood (GE100 BLOOD GLUCOSE TEST) test strip Use as instructed 02/03/16   [name], MD  HYDROcodone-acetaminophen (NORCO) 5-325 MG tablet Take 1 tablet by mouth every 6 (six) hours as needed for moderate pain. 10/15/18   Duanne Guess, PA-C  Lancets [facility] ULTRASOFT) lancets Check sugar twice daily 01/11/16   [name], MD  metFORMIN (GLUCOPHAGE) 1000 MG tablet Take 1 tablet (1,000 mg total) by mouth 2 (two) times daily with a meal. 12/09/17   [name], MD    Allergies Patient has no known allergies.  Family History  Problem Relation Age of Onset  . Diabetes Mother   . Asthma Mother   . Heart disease Father   . Heart attack Father   . Sjogren's syndrome Sister   . Heart disease Brother 49       CAD/CABG    Social History Social History   Tobacco Use  . Smoking status: Never Smoker  . Smokeless tobacco: Never Used  Substance Use Topics  . Alcohol use: Yes    Comment: few times per year  . Drug use: No      Review of Systems Constitutional: No fever/chills Eyes: No visual changes. ENT: No sore throat. Cardiovascular: Positive chest pain Respiratory: Denies shortness of breath. Gastrointestinal: No abdominal pain.  No nausea, no vomiting.  No diarrhea.  No constipation. Genitourinary: Negative for dysuria. Musculoskeletal: Negative for back pain. Skin: Negative for rash. Neurological: Negative for headaches, focal weakness or numbness. All other ROS negative ____________________________________________   PHYSICAL EXAM:  VITAL SIGNS: ED Triage Vitals  Enc Vitals Group     BP 03/29/19 1416 124/77     Pulse Rate 03/29/19 1416 91     Resp 03/29/19 1416 20     Temp 03/29/19 1416 98.1 F (36.7 C)     Temp Source 03/29/19 1416 Oral     SpO2 03/29/19 1416 96 %     Weight 03/29/19 1334 (!) 367 lb (166.5 kg)     Height 03/29/19 1334 5\' 8"  (1.727 m)     Head Circumference --      Peak Flow --      Pain Score 03/29/19 1333 2     Pain  Loc --      Pain Edu? --      Excl. in [place]? --     Constitutional: Alert and oriented. Well appearing and in no acute distress.  Overweight male. Eyes: Conjunctivae are normal. EOMI. Head: Atraumatic. Nose: No congestion/rhinnorhea. Mouth/Throat: Mucous membranes are moist.   Neck: No stridor. Trachea Midline. FROM Cardiovascular: Normal rate, regular rhythm. Grossly normal heart sounds.  Good peripheral circulation.  No chest wall tenderness.  No rash noted Respiratory: Normal respiratory effort.  No retractions. Lungs CTAB. Gastrointestinal: Soft and nontender. No distention. No abdominal bruits.  Musculoskeletal: No lower extremity tenderness nor edema.  No joint effusions. Neurologic:  Normal speech and language. No gross  focal neurologic deficits are appreciated.  Skin:  Skin is warm, dry and intact. No rash noted. Psychiatric: Mood and affect are normal. Speech and behavior are normal. GU: Deferred   ____________________________________________   LABS (all labs ordered are listed, but only abnormal results are displayed)  Labs Reviewed  BASIC METABOLIC PANEL - Abnormal; Notable for the following components:      Result Value   Glucose, Bld 184 (*)    All other components within normal limits  CBC  FIBRIN DERIVATIVES D-DIMER ([facility] ONLY)  TROPONIN I (HIGH SENSITIVITY)  TROPONIN I (HIGH SENSITIVITY)   ____________________________________________   ED ECG REPORT I, Vanessa Tupman, the attending physician, personally viewed and interpreted this ECG.  EKG is normal sinus rate of 98, no ST elevation, no T wave inversions, normal intervals ____________________________________________  RADIOLOGY Robert Bellow, personally viewed and evaluated these images (plain radiographs) as part of my medical decision making, as well as reviewing the written report by the radiologist.  ED MD interpretation: Chest x-ray without evidence of pneumonia  Official radiology report(s): Dg  Chest 2 View  Result Date: 03/29/2019 CLINICAL DATA:  Chest pain. EXAM: CHEST - 2 VIEW COMPARISON:  04/26/2018 FINDINGS: Heart size appears normal. No pleural effusion or edema identified. No airspace opacities. Spondylosis noted within the thoracic spine. IMPRESSION: No active cardiopulmonary abnormalities. Electronically Signed   By: Kerby Moors M.D.   On: 03/29/2019 14:14    ____________________________________________   PROCEDURES  Procedure(s) performed (including Critical Care):  Procedures   ____________________________________________   INITIAL IMPRESSION / ASSESSMENT AND PLAN / ED COURSE   Boruch R Fairclough was evaluated in Emergency Department on 03/29/2019 for the symptoms described in the history of present illness. He was evaluated in the context of the global COVID-19 pandemic, which necessitated consideration that the patient might be at risk for infection with the SARS-CoV-2 virus that causes COVID-19. Institutional protocols and algorithms that pertain to the evaluation of patients at risk for COVID-19 are in a state of rapid change based on information released by regulatory bodies including the CDC and federal and state organizations. These policies and algorithms were followed during the patient's care in the ED.    Most Likely DDx:  -MSK (atypical chest pain) but will get cardiac markers to evaluate for ACS given risk factors/age.  Patient is without chest pain at this time.  Declined pain medicine.    DDx that was also considered d/t potential to cause harm, but was found less likely based on history and physical (as detailed above): -PNA (no fevers, cough but CXR to evaluate) -PNX (reassured with equal b/l breath sounds, CXR to evaluate) -Symptomatic anemia (will get H&H) -Pulmonary embolism given his pleuritic in nature will get D-dimer otherwise no risk factors. -Aortic Dissection as no tearing pain and no radiation to the mid back, pulses equal  -Pericarditis no rub on exam, EKG changes or hx to suggest dx -Tamponade (no notable SOB, tachycardic, hypotensive) -Esophageal rupture (no h/o diffuse vomitting/no crepitus)  Initial troponin was 4.  Chest x-ray without evidence of pneumonia.  No evidence of anemia.  Repeat cardiac markers rule out for ACS.  D-dimer is negative.  Reevaluated patient continues to be chest pain-free.  Patient is presenting with atypical chest pain.  Recommend following up with cardiologist as needed and to return for any change in his symptoms.  I discussed the provisional nature of ED diagnosis, the treatment so far, the ongoing plan of care, follow up appointments  and return precautions with the patient and any family or support people present. They expressed understanding and agreed with the plan, discharged home.   ____________________________________   FINAL CLINICAL IMPRESSION(S) / ED DIAGNOSES   Final diagnoses:  Atypical chest pain     MEDICATIONS GIVEN DURING THIS VISIT:  Medications  sodium chloride flush (NS) 0.9 % injection 3 mL (has no administration in time range)     ED Discharge Orders    None       Note:  This document was prepared using Dragon voice recognition software and may include unintentional dictation errors.   Vanessa [place], MD 03/29/19 [phone]

## 2019-03-29 NOTE — ED Triage Notes (Signed)
Pt to ED from home c/o left chest pain x3 days.  States pain is sharp and is intermittent, some radiation to left shoulder last night.  Denies SOB or n/v/d. States hx of pulmonary HTN, diabetes, and 40% blockage seen by Dr. Sophronia Simas.  Pt presents A&Ox4, chest rise even and unlabored, in NAD at this time.

## 2019-03-29 NOTE — ED Notes (Signed)
This RN and EDP at bedside at this time.

## 2019-03-31 ENCOUNTER — Telehealth: Payer: Self-pay | Admitting: Physician Assistant

## 2019-03-31 NOTE — Progress Notes (Deleted)
Cardiology Office Note    Date:  03/31/2019   ID:  Brett Marshall, DOB May 01, 1966, MRN [id number]  PCP:  [name], MD  Cardiologist:  [name], MD  Electrophysiologist:  None   Chief Complaint: Preprocedure evaluation  History of Present Illness:   Brett Marshall is a 53 y.o. male with history of HFrEF secondary to NICM, DM2, HTN, morbid obesity, OSA on CPAP, and DDD who presents for preprocedure cardiac evaluation for colonoscopy.  Prior right and left cardiac cath in 08/2017 showed mild nonobstructive CAD, mildly reduced LV systolic function with an EF of 45 to 50%, global hypokinesis.  Right heart cath showed moderately elevated filling pressures with a wedge pressure of 22 mmHg, mild pulmonary hypertension, and high cardiac output.  He was subsequently referred to pulmonology and found to have sleep apnea confirmed by sleep study.  With use of CPAP he noted significant improvement in symptoms.  He was last seen virtually on 09/15/2018 and reported stable exertional dyspnea without leg edema.  He had lost 12 pounds since his last visit with improved lifestyle changes.  Documented home weight at that time was 359 pounds.  He continued to have intermittent atypical chest pain that was stable.  He did note dizziness when standing up quickly.  He was continued on current doses of carvedilol and enalapril.  Labs were reviewed from 04/2018 which showed mild volume depletion with slight elevation in BUN/serum creatinine.  It was felt that his orthostatic dizziness may reflect some degree of volume depletion.  He was advised to use furosemide on an as-needed basis rather than daily.  He was initially seen for preprocedure evaluation on 03/07/2019 and was doing well from a cardiac perspective. He continued to note some positional dizziness, though was taking Lasix daily rather than the previously recommended prn dosing. He noted some bendopnea in the setting of morbid obesity. Orthostatic  vitals were positive in the office that day. He was felt to be low risk for colonoscopy without any further cardiac workup needed at that time. With regards to his bendopnea, there was felt to be a strong component of morbid obesity playing a role. He was again advised to take Lasix as needed.  His colonoscopy was cancelled on 11/3 secondary to poor prep and rescheduled for 11/24.   Since he was last seen, he was evaluated in the ED on 11/11 with chest pain, felt to be atypical. Pain was described as intermittent and lasting for a few seconds in duration. High sensitivity troponin 4 with a delta of 3, D-dimer negative, CBC unremarkable, potassium 4.7, CXR not acute, EKG showed no acute ischemic changes.   He comes in today for repeat preprocedure evaluation. ***   Labs: 03/2019 - HGB 13.1, potassium 4.8, BUN 14, SCr 1.3, albumin 3.6, AST/ALT normal, TC 127, TG 128, HDL 41, LDL 60, TSH normal 02/2019 - A1c 7.7   Past Medical History:  Diagnosis Date  . Asthma   . CHF (congestive heart failure) ([place])   . DDD (degenerative disc disease), lumbar   . Depression   . Diabetes mellitus without complication ([place])   . Essential hypertension, benign   . Leg swelling    Left  . Low back pain   . OSA on CPAP    non compliant  . Pulmonary hypertension ([place])     Past Surgical History:  Procedure Laterality Date  . CARDIAC CATHETERIZATION     [place] no stents.  Marland Kitchen RIGHT/LEFT HEART  CATH AND CORONARY ANGIOGRAPHY N/A 09/02/2017   Procedure: RIGHT/LEFT HEART CATH AND CORONARY ANGIOGRAPHY;  Surgeon: Wellington Hampshire, MD;  Location: [place] [facility];  Service: Cardiovascular;  Laterality: N/A;    Current Medications: No outpatient medications have been marked as taking for the 04/05/19 encounter (Appointment) with Rise Mu, PA-C.    Allergies:   Patient has no known allergies.   Social History   Socioeconomic History  . Marital status: Married    Spouse name: Not on file  .  Number of children: Not on file  . Years of education: Not on file  . Highest education level: Not on file  Occupational History  . Occupation: Chemical engineer: [employer]  Social Needs  . Financial resource strain: Not on file  . Food insecurity    Worry: Not on file    Inability: Not on file  . Transportation needs    Medical: Not on file    Non-medical: Not on file  Tobacco Use  . Smoking status: Never Smoker  . Smokeless tobacco: Never Used  Substance and Sexual Activity  . Alcohol use: Yes    Comment: few times per year  . Drug use: No  . Sexual activity: Yes  Lifestyle  . Physical activity    Days per week: Not on file    Minutes per session: Not on file  . Stress: Not on file  Relationships  . Social Herbalist on phone: Not on file    Gets together: Not on file    Attends religious service: Not on file    Active member of club or organization: Not on file    Attends meetings of clubs or organizations: Not on file    Relationship status: Not on file  Other Topics Concern  . Not on file  Social History Narrative   Goes to gym.     Family History:  The patient's family history includes Asthma in his mother; Diabetes in his mother; Heart attack in his father; Heart disease in his father; Heart disease (age of onset: 64) in his brother; Sjogren's syndrome in his sister.  ROS:   ROS   EKGs/Labs/Other Studies Reviewed:    Studies reviewed were summarized above. The additional studies were reviewed today: As above.  EKG:  EKG is ordered today.  The EKG ordered today demonstrates ***  Recent Labs: 03/29/2019: BUN 19; Creatinine, Ser 1.13; Hemoglobin 13.5; Platelets 241; Potassium 4.7; Sodium 136  Recent Lipid Panel    Component Value Date/Time   CHOL 176 12/08/2017 1150   TRIG 143 12/08/2017 1150   HDL 44 12/08/2017 1150   CHOLHDL 4.0 12/08/2017 1150   CHOLHDL 3.5 01/11/2016 1008   VLDL 17 01/11/2016 1008   LDLCALC 103 (H) 12/08/2017  1150    PHYSICAL EXAM:    VS:  There were no vitals taken for this visit.  BMI: There is no height or weight on file to calculate BMI.  Physical Exam  Wt Readings from Last 3 Encounters:  03/29/19 (!) 367 lb (166.5 kg)  03/07/19 (!) 359 lb 8 oz (163.1 kg)  10/15/18 (!) 361 lb (163.7 kg)     ASSESSMENT & PLAN:   1. ***  Disposition: F/u with Dr. Fletcher Anon or an APP in ***.   Medication Adjustments/Labs and Tests Ordered: Current medicines are reviewed at length with the patient today.  Concerns regarding medicines are outlined above. Medication changes, Labs and Tests ordered today  are summarized above and listed in the Patient Instructions accessible in Encounters.   Signed, Christell Faith, PA-C 03/31/2019 3:32 PM     [facility] [address] [place], [place] [address] [phone]

## 2019-03-31 NOTE — Telephone Encounter (Signed)
Please call to inform of status of clearance

## 2019-03-31 NOTE — Telephone Encounter (Signed)
Returned call to [place] in GI. No answer, LMTCB.   I let her know that I reached out to Christell Faith, PA regarding cardiac clearance and he said given the ED visits for chest pain. He would need reevaluation for clearance.   I also called patient to make him aware. Pt agreeable to return to clinic for further evaluation.   appt scheduled for next wed.   Advised pt to call for any further questions or concerns.

## 2019-03-31 NOTE — Telephone Encounter (Signed)
Patient was cleared for procedure when I saw him.  However, he has subsequently been seen in the ED for chest pain felt to be atypical in etiology.  However, given this he will need to be evaluated further prior to providing risk stratification.

## 2019-04-05 ENCOUNTER — Ambulatory Visit: Payer: BC Managed Care – PPO | Admitting: Physician Assistant

## 2019-04-06 ENCOUNTER — Encounter: Payer: Self-pay | Admitting: Nurse Practitioner

## 2019-04-06 ENCOUNTER — Other Ambulatory Visit: Payer: Self-pay

## 2019-04-06 ENCOUNTER — Ambulatory Visit (INDEPENDENT_AMBULATORY_CARE_PROVIDER_SITE_OTHER): Payer: BC Managed Care – PPO | Admitting: Nurse Practitioner

## 2019-04-06 VITALS — BP 110/60 | HR 90 | Temp 96.9°F | Ht 68.0 in | Wt 364.0 lb

## 2019-04-06 DIAGNOSIS — R0789 Other chest pain: Secondary | ICD-10-CM

## 2019-04-06 DIAGNOSIS — E782 Mixed hyperlipidemia: Secondary | ICD-10-CM

## 2019-04-06 DIAGNOSIS — I10 Essential (primary) hypertension: Secondary | ICD-10-CM

## 2019-04-06 DIAGNOSIS — I2510 Atherosclerotic heart disease of native coronary artery without angina pectoris: Secondary | ICD-10-CM | POA: Diagnosis not present

## 2019-04-06 NOTE — Patient Instructions (Signed)
Medication Instructions:  - Your physician recommends that you continue on your current medications as directed. Please refer to the Current Medication list given to you today.  *If you need a refill on your cardiac medications before your next appointment, please call your pharmacy*  Lab Work: - none ordered  If you have labs (blood work) drawn today and your tests are completely normal, you will receive your results only by: Marland Kitchen MyChart Message (if you have MyChart) OR . A paper copy in the mail If you have any lab test that is abnormal or we need to change your treatment, we will call you to review the results.  Testing/Procedures: - none ordered  Follow-Up: At [facility], you and your health needs are our priority.  As part of our continuing mission to provide you with exceptional heart care, we have created designated Provider Care Teams.  These Care Teams include your primary Cardiologist (physician) and Advanced Practice Providers (APPs -  Physician Assistants and Nurse Practitioners) who all work together to provide you with the care you need, when you need it.  Your next appointment:   As scheduled   The format for your next appointment:   as scheduled  Provider:   as scheduled  Other Instructions - n/a

## 2019-04-06 NOTE — Progress Notes (Signed)
Office Visit    Patient Name: Brett Marshall Date of Encounter: 04/06/2019  Primary Care Provider:  [name], MD Primary Cardiologist:  [name], MD  Chief Complaint    53 year old male with a history of HFrEF, nonischemic cardiomyopathy, type 2 diabetes mellitus, hypertension, obesity, sleep apnea on CPAP, and degenerative disc disease, who presents for preoperative evaluation (pending colonoscopy) after recent ER visit for atypical chest pain.  Past Medical History    Past Medical History:  Diagnosis Date  . Asthma   . CAD (coronary artery disease)    a. 08/2017 Cath: LM nl, lAD 30ost, 21m, 30d, RI min irregs, LCX min irregs, RCA small, 40p, EF 45-50%, diff HK.  . DDD (degenerative disc disease), lumbar   . Depression   . Diabetes mellitus without complication ([place])   . Essential hypertension, benign   . HFrEF (heart failure with reduced ejection fraction) ([place])    a. 08/2017 LV gram: EF 45-50%, glob HK.  . Leg swelling    Left  . Low back pain   . Morbid obesity ([place])   . NICM (nonischemic cardiomyopathy) ([place])    a. 08/2017 LV gram: EF 45-50%, global HK.  . OSA on CPAP    non compliant  . Pulmonary hypertension ([place])    Past Surgical History:  Procedure Laterality Date  . CARDIAC CATHETERIZATION     [place] no stents.  Marland Kitchen RIGHT/LEFT HEART CATH AND CORONARY ANGIOGRAPHY N/A 09/02/2017   Procedure: RIGHT/LEFT HEART CATH AND CORONARY ANGIOGRAPHY;  Surgeon: Wellington Hampshire, MD;  Location: [place] [facility];  Service: Cardiovascular;  Laterality: N/A;    Allergies  No Known Allergies  History of Present Illness    53 year old male with the above past medical history including HFrEF, nonischemic cardiomyopathy, type 2 diabetes mellitus, hypertension, obesity, sleep apnea on CPAP, and degenerative disc disease.  He previously underwent diagnostic catheterization in April 2019 showing mild, nonobstructive CAD with mildly reduced LV function-EF 45 to  50%, global hypokinesis.  Right heart catheter mild pulmonary hypertension, high normal cardiac output, and an elevated wedge pressure of 22 mmHg.  He was subsequently referred to pulmonology and found to have sleep apnea.  He has since been compliant with CPAP.  He was recently seen in clinic in October for clearance for colonoscopy.  He was cleared and underwent prep however prep was incomplete and colonoscopy was canceled and rescheduled.  Unfortunately, on November 11, he presented to the emergency department with a 2-day history of sharp and shooting, fleeting left-sided chest pain lasting 1 or 2 seconds, resolving, and then returning later.  There, ECG was nonacute and troponin was normal.  In the absence of objective evidence of ischemia or other significant findings, he was discharged home.  He has not had any return of chest pain.  He denies dyspnea, palpitations, PND, orthopnea, dizziness, syncope, edema, or early satiety.  Home Medications    Prior to Admission medications   Medication Sig Start Date End Date Taking? Authorizing Provider  albuterol (PROVENTIL HFA;VENTOLIN HFA) 108 (90 Base) MCG/ACT inhaler Inhale 1-2 puffs into the lungs every 6 (six) hours as needed for wheezing or shortness of breath. 03/22/18  Yes Cook, Jayce G, DO  atorvastatin (LIPITOR) 10 MG tablet Take 10 mg by mouth daily at 6 PM.  08/12/18 08/12/19 Yes [provider]  carvedilol (COREG) 6.25 MG tablet Take 1 tablet (6.25 mg total) by mouth 2 (two) times daily. Patient taking differently: Take 12.5  mg by mouth 2 (two) times daily.  01/24/18 04/05/28 Yes Wellington Hampshire, MD  cyclobenzaprine (FLEXERIL) 10 MG tablet Take 1 tablet (10 mg total) by mouth 3 (three) times daily as needed.For spasms 11/20/17  Yes [name], MD  DULoxetine (CYMBALTA) 30 MG capsule Take 1 capsule (30 mg total) by mouth daily. 08/23/17  Yes [name], MD  empagliflozin (JARDIANCE) 25 MG TABS tablet Take 25 mg by mouth daily.   02/21/19 02/21/20 Yes [provider]  enalapril (VASOTEC) 20 MG tablet Take 1 tablet (20 mg total) by mouth 2 (two) times daily. Patient taking differently: Take 40 mg by mouth every morning.  11/05/17  Yes [name], MD  famotidine (PEPCID) 40 MG tablet Take 40 mg by mouth daily.  08/14/18  Yes [provider]  furosemide (LASIX) 20 MG tablet Take 1 tablet (20 mg total) by mouth as needed (for SOB or weight gain of 3 lbs overnight). 03/07/19  Yes Dunn, Areta Haber, PA-C  glipiZIDE (GLUCOTROL) 5 MG tablet Take 1 tablet (5 mg total) by mouth 2 (two) times daily before a meal. 12/09/17  Yes [name], MD  glucose blood (GE100 BLOOD GLUCOSE TEST) test strip Use as instructed 02/03/16  Yes [name], MD  HYDROcodone-acetaminophen (NORCO) 5-325 MG tablet Take 1 tablet by mouth every 6 (six) hours as needed for moderate pain. 10/15/18  Yes Duanne Guess, PA-C  Lancets [facility] ULTRASOFT) lancets Check sugar twice daily 01/11/16  Yes [name], MD  metFORMIN (GLUCOPHAGE) 1000 MG tablet Take 1 tablet (1,000 mg total) by mouth 2 (two) times daily with a meal. 12/09/17  Yes [name], MD    Review of Systems    Atypical c/p as outlined above - none since ED visit.  He denies palpitations, dyspnea, pnd, orthopnea, n, v, dizziness, syncope, edema, weight gain, or early satiety.  All other systems reviewed and are otherwise negative except as noted above.  Physical Exam    VS:  BP 110/60 (BP Location: Left Arm, Patient Position: Sitting, Cuff Size: Large)   Pulse 90   Temp (!) 96.9 F (36.1 C)   Ht 5\' 8"  (1.727 m)   Wt (!) 364 lb (165.1 kg)   SpO2 99%   BMI 55.35 kg/m  , BMI Body mass index is 55.35 kg/m. GEN: Well nourished, well developed, in no acute distress. HEENT: normal. Neck: Supple, no JVD, carotid bruits, or masses. Cardiac: RRR, no murmurs, rubs, or gallops. No clubbing, cyanosis, edema.  Radials/DP/PT 2+ and equal bilaterally.  Respiratory:   Respirations regular and unlabored, clear to auscultation bilaterally. GI: Obese, soft, nontender, nondistended, BS + x 4. MS: no deformity or atrophy. Skin: warm and dry, no rash. Neuro:  Strength and sensation are intact. Psych: Normal affect.  Accessory Clinical Findings    ECG personally reviewed by me today - RSR, 90 - no acute changes.  Lab Results  Component Value Date   WBC 7.7 03/29/2019   HGB 13.5 03/29/2019   HCT 40.4 03/29/2019   MCV 86.1 03/29/2019   PLT 241 03/29/2019   Lab Results  Component Value Date   CREATININE 1.13 03/29/2019   BUN 19 03/29/2019   NA 136 03/29/2019   K 4.7 03/29/2019   CL 103 03/29/2019   CO2 25 03/29/2019   Lab Results  Component Value Date   ALT 32 12/08/2017   AST 22 12/08/2017   ALKPHOS 85 12/08/2017   BILITOT 0.2  12/08/2017   Lab Results  Component Value Date   CHOL 176 12/08/2017   HDL 44 12/08/2017   LDLCALC 103 (H) 12/08/2017   TRIG 143 12/08/2017   CHOLHDL 4.0 12/08/2017     Assessment & Plan    1.  Atypical chest pain/nonobstructive CAD/preoperative cardiovascular examination: Patient with a history of nonischemic cardiomyopathy status post catheterization in April 2019 revealing mild nonobstructive CAD.  He was recently seen in the emergency department in the setting of sharp, fleeting left-sided chest pain over a 2-day period, lasting a few seconds, resolving spontaneously, and returning later.  In the ED, ECG was without any acute changes and troponins were normal.  He has had no return of this atypical chest pain.  Given atypical symptoms, lack of objective evidence of ischemia, and finding of no significant CAD on catheterization in 2019, he does not require any additional ischemic evaluation at this time.  He is low risk for cardiac complications with pending colonoscopy and may proceed with as planned.  Recommend continuation of beta-blocker and statin therapy throughout the periprocedural period.  2.  Nonischemic  cardiomyopathy: EF 45 to 50%.  He has been feeling well without any dyspnea or edema.  Continue beta-blocker and ACE inhibitor therapy.  He uses Lasix on an as-needed basis only.  3.  Essential hypertension: Stable on beta-blocker and ACE inhibitor.  4.  Type 2 diabetes mellitus: On metformin, glipizide, Jardiance, statin, and ACE inhibitor therapy.  A1c 7.7 in October and this is followed closely by his primary care provider in [place].  5.  Obstructive sleep apnea: Compliant with CPAP.  6.  HL:  LDL 60 earlier this month w/ nl lft's. Cont statin rx  7. Disposition: Follow-up with Dr. Fletcher Anon in early 2021 as planned.  Murray Hodgkins, NP 04/06/2019, 4:09 PM

## 2019-04-07 ENCOUNTER — Other Ambulatory Visit
Admission: RE | Admit: 2019-04-07 | Discharge: 2019-04-07 | Disposition: A | Discharge status: Home / Self Care | Payer: BC Managed Care – PPO | Source: Ambulatory Visit | Attending: Gastroenterology | Admitting: Gastroenterology

## 2019-04-07 ENCOUNTER — Other Ambulatory Visit: Payer: Self-pay

## 2019-04-07 DIAGNOSIS — Z01812 Encounter for preprocedural laboratory examination: Secondary | ICD-10-CM | POA: Insufficient documentation

## 2019-04-07 DIAGNOSIS — Z20828 Contact with and (suspected) exposure to other viral communicable diseases: Secondary | ICD-10-CM | POA: Insufficient documentation

## 2019-04-07 LAB — SARS CORONAVIRUS 2 (TAT 6-24 HRS): SARS Coronavirus 2: NEGATIVE

## 2019-04-07 MED ORDER — PEG 3350-KCL-NA BICARB-NACL 420 G PO SOLR: ORAL | 0 refills | Status: DC

## 2019-04-11 ENCOUNTER — Encounter
Admission: RE | Disposition: A | Discharge status: Home / Self Care | Payer: Self-pay | Source: Home / Self Care | Attending: Gastroenterology

## 2019-04-11 ENCOUNTER — Ambulatory Visit
Admission: RE | Admit: 2019-04-11 | Discharge: 2019-04-11 | Disposition: A | Discharge status: Home / Self Care | Payer: BC Managed Care – PPO | Attending: Gastroenterology | Admitting: Gastroenterology

## 2019-04-11 ENCOUNTER — Ambulatory Visit: Payer: BC Managed Care – PPO | Admitting: Anesthesiology

## 2019-04-11 ENCOUNTER — Other Ambulatory Visit: Payer: Self-pay

## 2019-04-11 DIAGNOSIS — G4733 Obstructive sleep apnea (adult) (pediatric): Secondary | ICD-10-CM | POA: Insufficient documentation

## 2019-04-11 DIAGNOSIS — F329 Major depressive disorder, single episode, unspecified: Secondary | ICD-10-CM | POA: Insufficient documentation

## 2019-04-11 DIAGNOSIS — Z825 Family history of asthma and other chronic lower respiratory diseases: Secondary | ICD-10-CM | POA: Insufficient documentation

## 2019-04-11 DIAGNOSIS — K635 Polyp of colon: Secondary | ICD-10-CM | POA: Diagnosis not present

## 2019-04-11 DIAGNOSIS — M5136 Other intervertebral disc degeneration, lumbar region: Secondary | ICD-10-CM | POA: Diagnosis not present

## 2019-04-11 DIAGNOSIS — E119 Type 2 diabetes mellitus without complications: Secondary | ICD-10-CM | POA: Insufficient documentation

## 2019-04-11 DIAGNOSIS — D123 Benign neoplasm of transverse colon: Secondary | ICD-10-CM | POA: Diagnosis not present

## 2019-04-11 DIAGNOSIS — I509 Heart failure, unspecified: Secondary | ICD-10-CM | POA: Insufficient documentation

## 2019-04-11 DIAGNOSIS — I110 Hypertensive heart disease with heart failure: Secondary | ICD-10-CM | POA: Insufficient documentation

## 2019-04-11 DIAGNOSIS — I2510 Atherosclerotic heart disease of native coronary artery without angina pectoris: Secondary | ICD-10-CM | POA: Insufficient documentation

## 2019-04-11 DIAGNOSIS — K640 First degree hemorrhoids: Secondary | ICD-10-CM | POA: Insufficient documentation

## 2019-04-11 DIAGNOSIS — J45909 Unspecified asthma, uncomplicated: Secondary | ICD-10-CM | POA: Insufficient documentation

## 2019-04-11 DIAGNOSIS — Z1211 Encounter for screening for malignant neoplasm of colon: Secondary | ICD-10-CM | POA: Diagnosis not present

## 2019-04-11 DIAGNOSIS — I428 Other cardiomyopathies: Secondary | ICD-10-CM | POA: Insufficient documentation

## 2019-04-11 DIAGNOSIS — I272 Pulmonary hypertension, unspecified: Secondary | ICD-10-CM | POA: Diagnosis not present

## 2019-04-11 DIAGNOSIS — Z6841 Body Mass Index (BMI) 40.0 and over, adult: Secondary | ICD-10-CM | POA: Insufficient documentation

## 2019-04-11 DIAGNOSIS — Z7984 Long term (current) use of oral hypoglycemic drugs: Secondary | ICD-10-CM | POA: Diagnosis not present

## 2019-04-11 DIAGNOSIS — Z833 Family history of diabetes mellitus: Secondary | ICD-10-CM | POA: Insufficient documentation

## 2019-04-11 DIAGNOSIS — D125 Benign neoplasm of sigmoid colon: Secondary | ICD-10-CM | POA: Insufficient documentation

## 2019-04-11 DIAGNOSIS — Z8249 Family history of ischemic heart disease and other diseases of the circulatory system: Secondary | ICD-10-CM | POA: Diagnosis not present

## 2019-04-11 DIAGNOSIS — Z79899 Other long term (current) drug therapy: Secondary | ICD-10-CM | POA: Diagnosis not present

## 2019-04-11 HISTORY — PX: COLONOSCOPY WITH PROPOFOL: SHX5780

## 2019-04-11 SURGERY — COLONOSCOPY WITH PROPOFOL
Anesthesia: General

## 2019-04-11 MED ORDER — SODIUM CHLORIDE 0.9 % IV SOLN
INTRAVENOUS | Status: DC
  Administered 2019-04-11: 09:00:00 via INTRAVENOUS

## 2019-04-11 MED ORDER — PROPOFOL 500 MG/50ML IV EMUL
INTRAVENOUS | Status: DC | PRN
  Administered 2019-04-11: 140 ug/kg/min via INTRAVENOUS

## 2019-04-11 MED ORDER — PROPOFOL 10 MG/ML IV BOLUS
INTRAVENOUS | Status: DC | PRN
  Administered 2019-04-11: 50 mg via INTRAVENOUS
  Administered 2019-04-11: 70 mg via INTRAVENOUS
  Administered 2019-04-11: 30 mg via INTRAVENOUS

## 2019-04-11 NOTE — H&P (Signed)
[name], MD [facility] [address], [place], [place] [address] Phone: [phone] Fax : [phone]  Primary Care Physician:  [name], MD Primary Gastroenterologist:  Dr. Allen Norris  Pre-Procedure History & Physical: HPI:  Brett Marshall is a 53 y.o. male is here for a screening colonoscopy.   Past Medical History:  Diagnosis Date  . Asthma   . CAD (coronary artery disease)    a. 08/2017 Cath: LM nl, lAD 30ost, 42m, 30d, RI min irregs, LCX min irregs, RCA small, 40p, EF 45-50%, diff HK.  . DDD (degenerative disc disease), lumbar   . Depression   . Diabetes mellitus without complication ([place])   . Essential hypertension, benign   . HFrEF (heart failure with reduced ejection fraction) ([place])    a. 08/2017 LV gram: EF 45-50%, glob HK.  . Leg swelling    Left  . Low back pain   . Morbid obesity ([place])   . NICM (nonischemic cardiomyopathy) ([place])    a. 08/2017 LV gram: EF 45-50%, global HK.  . OSA on CPAP    non compliant  . Pulmonary hypertension ([place])     Past Surgical History:  Procedure Laterality Date  . CARDIAC CATHETERIZATION      no stents.  [place] Kitchen RIGHT/LEFT HEART CATH AND CORONARY ANGIOGRAPHY N/A 09/02/2017   Procedure: RIGHT/LEFT HEART CATH AND CORONARY ANGIOGRAPHY;  Surgeon: Wellington Hampshire, MD;  Location: [facility];  Service: Cardiovascular;  Laterality: N/A;    Prior to Admission medications   Medication Sig Start Date End Date Taking? Authorizing Provider  albuterol (PROVENTIL HFA;VENTOLIN HFA) 108 (90 Base) MCG/ACT inhaler Inhale 1-2 puffs into the lungs every 6 (six) hours as needed for wheezing or shortness of breath. 03/22/18  Yes Cook, Jayce G, DO  atorvastatin (LIPITOR) 10 MG tablet Take 10 mg by mouth daily at 6 PM.  08/12/18 08/12/19 Yes [provider]  carvedilol (COREG) 6.25 MG tablet Take 1 tablet (6.25 mg total) by mouth 2 (two) times daily. Patient taking differently: Take 12.5 mg by mouth 2 (two) times daily.   01/24/18 04/05/28 Yes Wellington Hampshire, MD  cyclobenzaprine (FLEXERIL) 10 MG tablet Take 1 tablet (10 mg total) by mouth 3 (three) times daily as needed.For spasms 11/20/17  Yes [name], MD  DULoxetine (CYMBALTA) 30 MG capsule Take 1 capsule (30 mg total) by mouth daily. 08/23/17  Yes [name], MD  empagliflozin (JARDIANCE) 25 MG TABS tablet Take 25 mg by mouth daily.  02/21/19 02/21/20 Yes [provider]  enalapril (VASOTEC) 20 MG tablet Take 1 tablet (20 mg total) by mouth 2 (two) times daily. Patient taking differently: Take 40 mg by mouth every morning.  11/05/17  Yes [name], MD  famotidine (PEPCID) 40 MG tablet Take 40 mg by mouth daily.  08/14/18  Yes [provider]  furosemide (LASIX) 20 MG tablet Take 1 tablet (20 mg total) by mouth as needed (for SOB or weight gain of 3 lbs overnight). 03/07/19  Yes Dunn, Areta Haber, PA-C  glipiZIDE (GLUCOTROL) 5 MG tablet Take 1 tablet (5 mg total) by mouth 2 (two) times daily before a meal. 12/09/17  Yes [name], MD  HYDROcodone-acetaminophen (NORCO) 5-325 MG tablet Take 1 tablet by mouth every 6 (six) hours as needed for moderate pain. 10/15/18  Yes Duanne Guess, PA-C  metFORMIN (GLUCOPHAGE) 1000 MG tablet Take 1 tablet (1,000 mg total) by mouth 2 (two) times daily with  a meal. 12/09/17  Yes [name], MD  glucose blood (GE100 BLOOD GLUCOSE TEST) test strip Use as instructed 02/03/16   [name], MD  Lancets [facility] ULTRASOFT) lancets Check sugar twice daily 01/11/16   [name], MD  polyethylene glycol-electrolytes (NULYTELY/GOLYTELY) 420 g solution Drink one 8 oz glass every 20 mins until entire container has been completed starting at 5:00pm on 04/10/19. 04/07/19   [name], MD    Allergies as of 03/22/2019  . (No Known Allergies)    Family History  Problem Relation Age of Onset  . Diabetes Mother   . Asthma Mother   . Heart disease Father   . Heart attack Father   . Sjogren's  syndrome Sister   . Heart disease Brother 10       CAD/CABG    Social History   Socioeconomic History  . Marital status: Married    Spouse name: Not on file  . Number of children: Not on file  . Years of education: Not on file  . Highest education level: Not on file  Occupational History  . Occupation: Chemical engineer: [employer]  Social Needs  . Financial resource strain: Not on file  . Food insecurity    Worry: Not on file    Inability: Not on file  . Transportation needs    Medical: Not on file    Non-medical: Not on file  Tobacco Use  . Smoking status: Never Smoker  . Smokeless tobacco: Never Used  Substance and Sexual Activity  . Alcohol use: Yes    Comment: few times per year  . Drug use: No  . Sexual activity: Yes  Lifestyle  . Physical activity    Days per week: Not on file    Minutes per session: Not on file  . Stress: Not on file  Relationships  . Social Herbalist on phone: Not on file    Gets together: Not on file    Attends religious service: Not on file    Active member of club or organization: Not on file    Attends meetings of clubs or organizations: Not on file    Relationship status: Not on file  . Intimate partner violence    Fear of current or ex partner: Not on file    Emotionally abused: Not on file    Physically abused: Not on file    Forced sexual activity: Not on file  Other Topics Concern  . Not on file  Social History Narrative   Goes to gym.    Review of Systems: See HPI, otherwise negative ROS  Physical Exam: BP 139/89 (BP Location: Right Arm)   Pulse (!) 105   Temp (!) 97.2 F (36.2 C) (Temporal)   Resp 17   Ht 5\' 8"  (1.727 m)   Wt (!) 165.1 kg   SpO2 100%   BMI 55.35 kg/m  General:   Alert,  pleasant and cooperative in NAD Head:  Normocephalic and atraumatic. Neck:  Supple; no masses or thyromegaly. Lungs:  Clear throughout to auscultation.    Heart:  Regular rate and rhythm. Abdomen:  Soft,  nontender and nondistended. Normal bowel sounds, without guarding, and without rebound.   Neurologic:  Alert and  oriented x4;  grossly normal neurologically.  Impression/Plan: Brett Marshall is now here to undergo a screening colonoscopy.  Risks, benefits, and alternatives regarding colonoscopy have been reviewed with the patient.  Questions have been  answered.  All parties agreeable.

## 2019-04-11 NOTE — Anesthesia Post-op Follow-up Note (Signed)
Anesthesia QCDR form completed.        

## 2019-04-11 NOTE — Anesthesia Preprocedure Evaluation (Signed)
Anesthesia Evaluation  Patient identified by MRN, date of birth, ID band Patient awake    Reviewed: Allergy & Precautions, H&P , NPO status , Patient's Chart, lab work & pertinent test results, reviewed documented beta blocker date and time   Airway Mallampati: IV   Neck ROM: full    Dental  (+) Teeth Intact   Pulmonary neg pulmonary ROS, asthma , sleep apnea and Continuous Positive Airway Pressure Ventilation ,    Pulmonary exam normal        Cardiovascular Exercise Tolerance: Poor hypertension, + CAD and +CHF  negative cardio ROS Normal cardiovascular exam Rhythm:regular Rate:Normal     Neuro/Psych PSYCHIATRIC DISORDERS Depression negative neurological ROS  negative psych ROS   GI/Hepatic negative GI ROS, Neg liver ROS, (+) Hepatitis -  Endo/Other  negative endocrine ROSdiabetes, Poorly Controlled, Type 2, Oral Hypoglycemic Agents  Renal/GU negative Renal ROS  negative genitourinary   Musculoskeletal   Abdominal   Peds  Hematology negative hematology ROS (+)   Anesthesia Other Findings Past Medical History: No date: Asthma No date: CAD (coronary artery disease)     Comment:  a. 08/2017 Cath: LM nl, lAD 30ost, 65m 30d, RI min               irregs, LCX min irregs, RCA small, 40p, EF 45-50%, diff               HK. No date: DDD (degenerative disc disease), lumbar No date: Depression No date: Diabetes mellitus without complication ([facility]) No date: Essential hypertension, benign No date: HFrEF (heart failure with reduced ejection fraction) ([place]     Comment:  a. 08/2017 LV gram: EF 45-50%, glob HK. No date: Leg swelling     Comment:  Left No date: Low back pain No date: Morbid obesity ([place] No date: NICM (nonischemic cardiomyopathy) ([place]     Comment:  a. 08/2017 LV gram: EF 45-50%, global HK. No date: OSA on CPAP     Comment:  non compliant No date: Pulmonary hypertension ([place] Past Surgical History: No date:  CARDIAC CATHETERIZATION     Comment:  [place] no stents. 09/02/2017: RIGHT/LEFT HEART CATH AND CORONARY ANGIOGRAPHY; N/A     Comment:  Procedure: RIGHT/LEFT HEART CATH AND CORONARY               ANGIOGRAPHY;  Surgeon: AWellington Hampshire MD;  [facility];  Service: Cardiovascular;                Laterality: N/A; BMI    Body Mass Index: 55.35 kg/m     Reproductive/Obstetrics negative OB ROS                             Anesthesia Physical Anesthesia Plan  ASA: IV  Anesthesia Plan: General   Post-op Pain Management:    Induction:   PONV Risk Score and Plan:   Airway Management Planned:   Additional Equipment:   Intra-op Plan:   Post-operative Plan:   Informed Consent: I have reviewed the patients History and Physical, chart, labs and discussed the procedure including the risks, benefits and alternatives for the proposed anesthesia with the patient or authorized representative who has indicated his/her understanding and acceptance.     Dental Advisory Given  Plan Discussed with: CRNA  Anesthesia Plan Comments:  Anesthesia Quick Evaluation

## 2019-04-11 NOTE — Transfer of Care (Signed)
Immediate Anesthesia Transfer of Care Note  Patient: Brett Marshall  Procedure(s) Performed: COLONOSCOPY WITH PROPOFOL (N/A )  Patient Location: PACU  Anesthesia Type:General  Level of Consciousness: sedated  Airway & Oxygen Therapy: Patient Spontanous Breathing and Patient connected to face mask oxygen  Post-op Assessment: Report given to RN and Post -op Vital signs reviewed and stable  Post vital signs: Reviewed and stable  Last Vitals:  Vitals Value Taken Time  BP    Temp    Pulse 113 04/11/19 1051  Resp 30 04/11/19 1051  SpO2 100 % 04/11/19 1051  Vitals shown include unvalidated device data.  Last Pain:  Vitals:   04/11/19 1049  TempSrc:   PainSc: 0-No pain         Complications: No apparent anesthesia complications

## 2019-04-11 NOTE — Op Note (Signed)
[facility] Gastroenterology Patient Name: Brett Marshall Procedure Date: 04/11/2019 10:08 AM MRN: [id number] Account #: [id number] Date of Birth: 28-Mar-1966 Admit Type: Outpatient Age: 53 Room: [facility] ENDO ROOM 4 Gender: Male Note Status: Finalized Procedure:             Colonoscopy Indications:           Screening for colorectal malignant neoplasm Providers:             [name] MD, MD Medicines:             Propofol per Anesthesia Complications:         No immediate complications. Procedure:             Pre-Anesthesia Assessment:                        - Prior to the procedure, a History and Physical was                         performed, and patient medications and allergies were                         reviewed. The patient's tolerance of previous                         anesthesia was also reviewed. The risks and benefits                         of the procedure and the sedation options and risks                         were discussed with the patient. All questions were                         answered, and informed consent was obtained. Prior                         Anticoagulants: The patient has taken no previous                         anticoagulant or antiplatelet agents. ASA Grade                         Assessment: II - A patient with mild systemic disease.                         After reviewing the risks and benefits, the patient                         was deemed in satisfactory condition to undergo the                         procedure.                        After obtaining informed consent, the colonoscope was                         passed under direct vision. Throughout the procedure,  the patient's blood pressure, pulse, and oxygen                         saturations were monitored continuously. The                         Colonoscope was introduced through the anus and                         advanced to the the  cecum, identified by appendiceal                         orifice and ileocecal valve. The colonoscopy was                         performed without difficulty. The patient tolerated                         the procedure well. The quality of the bowel                         preparation was good. Findings:      The perianal and digital rectal examinations were normal.      A 4 mm polyp was found in the transverse colon. The polyp was sessile.       The polyp was removed with a cold biopsy forceps. Resection and       retrieval were complete.      A 4 mm polyp was found in the sigmoid colon. The polyp was sessile. The       polyp was removed with a cold biopsy forceps. Resection and retrieval       were complete.      Non-bleeding internal hemorrhoids were found during retroflexion. The       hemorrhoids were Grade I (internal hemorrhoids that do not prolapse). Impression:            - One 4 mm polyp in the transverse colon, removed with                         a cold biopsy forceps. Resected and retrieved.                        - One 4 mm polyp in the sigmoid colon, removed with a                         cold biopsy forceps. Resected and retrieved.                        - Non-bleeding internal hemorrhoids. Recommendation:        - Discharge patient to home.                        - Resume previous diet.                        - Continue present medications.                        - Await pathology results.                        -  Repeat colonoscopy in 5 years if polyp adenoma and                         10 years if hyperplastic Procedure Code(s):     --- Professional ---                        [phone], Colonoscopy, flexible; with biopsy, single or                         multiple Diagnosis Code(s):     --- Professional ---                        Z12.11, Encounter for screening for malignant neoplasm                         of colon                        K63.5, Polyp of colon CPT  copyright 2019 American Medical Association. All rights reserved. The codes documented in this report are preliminary and upon coder review may  be revised to meet current compliance requirements. [name] MD, MD 04/11/2019 10:50:06 AM This report has been signed electronically. Number of Addenda: 0 Note Initiated On: 04/11/2019 10:08 AM Scope Withdrawal Time: 0 hours 13 minutes 29 seconds  Total Procedure Duration: 0 hours 20 minutes 55 seconds  Estimated Blood Loss:  Estimated blood loss: none.      [facility]

## 2019-04-12 ENCOUNTER — Encounter: Payer: Self-pay | Admitting: Gastroenterology

## 2019-04-12 LAB — SURGICAL PATHOLOGY

## 2019-04-12 NOTE — Anesthesia Postprocedure Evaluation (Signed)
Anesthesia Post Note  Patient: Brett Marshall  Procedure(s) Performed: COLONOSCOPY WITH PROPOFOL (N/A )  Patient location during evaluation: PACU Anesthesia Type: General Level of consciousness: awake and alert Pain management: pain level controlled Vital Signs Assessment: post-procedure vital signs reviewed and stable Respiratory status: spontaneous breathing, nonlabored ventilation and respiratory function stable Cardiovascular status: blood pressure returned to baseline and stable Postop Assessment: no apparent nausea or vomiting Anesthetic complications: no     Last Vitals:  Vitals:   04/11/19 1109 04/11/19 1119  BP: (!) 161/100 (!) 154/104  Pulse:    Resp: 18   Temp:    SpO2:      Last Pain:  Vitals:   04/12/19 0933  TempSrc:   PainSc: 0-No pain                 Durenda Hurt

## 2019-04-17 ENCOUNTER — Encounter: Payer: Self-pay | Admitting: Gastroenterology

## 2019-06-05 DIAGNOSIS — E7800 Pure hypercholesterolemia, unspecified: Secondary | ICD-10-CM | POA: Insufficient documentation

## 2019-06-13 ENCOUNTER — Ambulatory Visit: Payer: BC Managed Care – PPO | Admitting: Cardiovascular Disease

## 2019-06-27 ENCOUNTER — Encounter: Payer: Self-pay | Admitting: Cardiovascular Disease

## 2019-06-27 ENCOUNTER — Ambulatory Visit (INDEPENDENT_AMBULATORY_CARE_PROVIDER_SITE_OTHER): Payer: BC Managed Care – PPO | Admitting: Cardiovascular Disease

## 2019-06-27 ENCOUNTER — Other Ambulatory Visit: Payer: Self-pay

## 2019-06-27 VITALS — BP 162/92 | HR 96 | Ht 68.0 in | Wt 361.0 lb

## 2019-06-27 DIAGNOSIS — E785 Hyperlipidemia, unspecified: Secondary | ICD-10-CM

## 2019-06-27 DIAGNOSIS — I5022 Chronic systolic (congestive) heart failure: Secondary | ICD-10-CM

## 2019-06-27 DIAGNOSIS — I10 Essential (primary) hypertension: Secondary | ICD-10-CM | POA: Diagnosis not present

## 2019-06-27 NOTE — Patient Instructions (Signed)

## 2019-06-27 NOTE — Progress Notes (Signed)
Cardiology Office Note   Date:  06/27/2019   ID:  Brett Marshall, DOB July 26, 1965, MRN [id number]  PCP:  [name], MD  Cardiologist:   [name], MD   Chief Complaint  Patient presents with  . Other    3 month follow up. Meds reviewed verbally with patient.      History of Present Illness: Brett Marshall is a 54 y.o. male who is here today for a follow-up visit regarding chronic systolic heart failure due to nonischemic cardiomyopathy.   The patient has multiple chronic medical conditions that include type 2 diabetes, essential hypertension, morbid obesity, obstructive sleep apnea on CPAP and degenerative disc disease.  Right and left cardiac catheterization done in April of 2019 showed mild nonobstructive coronary artery disease, mildly reduced LV systolic function with an EF of 45 to 50% with global hypokinesis.  Right heart catheterization showed moderately elevated filling pressures with a wedge pressure of 22 mmHg, mild pulmonary hypertension and high cardiac output.   He had atypical chest pain in November with negative troponin.  He had colonoscopy done in November without complications.  He has been doing reasonably well with no recent chest pain or shortness of breath.  Blood pressure is elevated today but normally his blood pressure is in the normal range.    Past Medical History:  Diagnosis Date  . Asthma   . CAD (coronary artery disease)    a. 08/2017 Cath: LM nl, lAD 30ost, 69m 30d, RI min irregs, LCX min irregs, RCA small, 40p, EF 45-50%, diff HK.  . DDD (degenerative disc disease), lumbar   . Depression   . Diabetes mellitus without complication ([place]   . Essential hypertension, benign   . HFrEF (heart failure with reduced ejection fraction) ([place]    a. 08/2017 LV gram: EF 45-50%, glob HK.  . Leg swelling    Left  . Low back pain   . Morbid obesity ([place]   . NICM (nonischemic cardiomyopathy) ([place]    a. 08/2017 LV gram: EF 45-50%, global HK.    . OSA on CPAP    non compliant  . Pulmonary hypertension ([place]     Past Surgical History:  Procedure Laterality Date  . CARDIAC CATHETERIZATION     [place] no stents.  . COLONOSCOPY WITH PROPOFOL N/A 04/11/2019   Procedure: COLONOSCOPY WITH PROPOFOL;  Surgeon: WLucilla Lame MD;  [facility];  Service: Endoscopy;  Laterality: N/A;  . RIGHT/LEFT HEART CATH AND CORONARY ANGIOGRAPHY N/A 09/02/2017   Procedure: RIGHT/LEFT HEART CATH AND CORONARY ANGIOGRAPHY;  Surgeon: AWellington Hampshire MD;  [facility];  Service: Cardiovascular;  Laterality: N/A;     Current Outpatient Medications  Medication Sig Dispense Refill  . albuterol (PROVENTIL HFA;VENTOLIN HFA) 108 (90 Base) MCG/ACT inhaler Inhale 1-2 puffs into the lungs every 6 (six) hours as needed for wheezing or shortness of breath. 1 Inhaler 0  . atorvastatin (LIPITOR) 10 MG tablet Take 10 mg by mouth daily at 6 PM.     . carvedilol (COREG) 6.25 MG tablet Take 1 tablet (6.25 mg total) by mouth 2 (two) times daily. 60 tablet 5  . cyclobenzaprine (FLEXERIL) 10 MG tablet Take 1 tablet (10 mg total) by mouth 3 (three) times daily as needed.For spasms 90 tablet 5  . DULoxetine (CYMBALTA) 30 MG capsule Take 1 capsule (30 mg total) by mouth daily. 90 capsule 1  . empagliflozin (JARDIANCE) 25 MG TABS tablet Take 25 mg  by mouth daily.     . enalapril (VASOTEC) 20 MG tablet Take 1 tablet (20 mg total) by mouth 2 (two) times daily. (Patient taking differently: Take 40 mg by mouth every morning. ) 60 tablet 5  . famotidine (PEPCID) 40 MG tablet Take 40 mg by mouth daily.     . furosemide (LASIX) 20 MG tablet Take 1 tablet (20 mg total) by mouth as needed (for SOB or weight gain of 3 lbs overnight). 90 tablet 3  . glipiZIDE (GLUCOTROL) 5 MG tablet Take 1 tablet (5 mg total) by mouth 2 (two) times daily before a meal. 180 tablet 1  . glucose blood (GE100 BLOOD GLUCOSE TEST) test strip Use as instructed 100 each 12  .  HYDROcodone-acetaminophen (NORCO) 5-325 MG tablet Take 1 tablet by mouth every 6 (six) hours as needed for moderate pain. 15 tablet 0  . Lancets (ONETOUCH ULTRASOFT) lancets Check sugar twice daily 100 each 12  . metFORMIN (GLUCOPHAGE) 1000 MG tablet Take 1 tablet (1,000 mg total) by mouth 2 (two) times daily with a meal. 180 tablet 1  . polyethylene glycol-electrolytes (NULYTELY/GOLYTELY) 420 g solution Drink one 8 oz glass every 20 mins until entire container has been completed starting at 5:00pm on 04/10/19. 4000 mL 0   No current facility-administered medications for this visit.    Allergies:   Patient has no known allergies.    Social History:  The patient  reports that he has never smoked. He has never used smokeless tobacco. He reports current alcohol use. He reports that he does not use drugs.   Family History:  The patient's family history includes Asthma in his mother; Diabetes in his mother; Heart attack in his father; Heart disease in his father; Heart disease (age of onset: 18) in his brother; Sjogren's syndrome in his sister.    ROS:  Please see the history of present illness.   Otherwise, review of systems are positive for none.   All other systems are reviewed and negative.   PHYSICAL EXAM: VS:  BP (!) 162/92 (BP Location: Left Arm, Patient Position: Sitting, Cuff Size: Large)   Pulse 96   Ht 5' 8"  (1.727 m)   Wt (!) 361 lb (163.7 kg)   BMI 54.89 kg/m  , BMI Body mass index is 54.89 kg/m. GEN: Well nourished, well developed, in no acute distress  HEENT: normal  Neck: no JVD or masses.  Bilateral carotid bruits. Cardiac: RRR; norubs, or gallops.  1/ systolic murmur at the aortic area.  Mild bilateral leg edema. Respiratory:  clear to auscultation bilaterally, normal work of breathing GI: soft, nontender, nondistended, + BS MS: no deformity or atrophy  Skin: warm and dry, no rash Neuro:  Strength and sensation are intact Psych: euthymic mood, full affect   EKG:   EKG is ordered today. The ekg ordered today demonstrates normal sinus rhythm with low voltage and no significant ST or T wave changes.   Recent Labs: 03/29/2019: BUN 19; Creatinine, Ser 1.13; Hemoglobin 13.5; Platelets 241; Potassium 4.7; Sodium 136    Lipid Panel    Component Value Date/Time   CHOL 176 12/08/2017 1150   TRIG 143 12/08/2017 1150   HDL 44 12/08/2017 1150   CHOLHDL 4.0 12/08/2017 1150   CHOLHDL 3.5 01/11/2016 1008   VLDL 17 01/11/2016 1008   LDLCALC 103 (H) 12/08/2017 1150      Wt Readings from Last 3 Encounters:  06/27/19 (!) 361 lb (163.7 kg)  04/11/19 (!) 364 lb (  165.1 kg)  04/06/19 (!) 364 lb (165.1 kg)     PAD Screen 08/27/2017  Previous PAD dx? No  Previous surgical procedure? No  Pain with walking? Yes  Subsides with rest? No  Feet/toe relief with dangling? Yes  Painful, non-healing ulcers? No  Extremities discolored? Yes      ASSESSMENT AND PLAN:  1.  Chronic systolic heart failure with mildly reduced LV systolic function due to nonischemic cardiomyopathy: EF of 45 to 50%.   Continue current dose of carvedilol and enalapril. He continues to use furosemide as needed has not required this.  2.  Bilateral carotid bruits: Carotid Doppler showed normal carotid arteries.  3.  Essential hypertension: Blood pressure is elevated today but usually his blood pressure is controlled.  Continue same medications for now.  4.    Mild hyperlipidemia: Currently on atorvastatin 10 mg daily given that he is diabetic  5.  Sleep apnea: Improved symptoms with CPAP   Disposition:   FU with me in 6 month  Signed,  [name], MD  06/27/2019 5:00 PM    [place]

## 2019-07-20 ENCOUNTER — Ambulatory Visit: Payer: BC Managed Care – PPO | Admitting: Cardiovascular Disease

## 2019-12-28 ENCOUNTER — Other Ambulatory Visit: Payer: Self-pay

## 2019-12-28 ENCOUNTER — Encounter: Payer: Self-pay | Admitting: Cardiovascular Disease

## 2019-12-28 ENCOUNTER — Ambulatory Visit: Payer: BC Managed Care – PPO | Admitting: Cardiovascular Disease

## 2019-12-28 ENCOUNTER — Telehealth: Payer: Self-pay | Admitting: Cardiovascular Disease

## 2019-12-28 VITALS — BP 124/84 | HR 97 | Ht 68.0 in | Wt 349.1 lb

## 2019-12-28 DIAGNOSIS — I5022 Chronic systolic (congestive) heart failure: Secondary | ICD-10-CM | POA: Diagnosis not present

## 2019-12-28 DIAGNOSIS — E785 Hyperlipidemia, unspecified: Secondary | ICD-10-CM

## 2019-12-28 DIAGNOSIS — I10 Essential (primary) hypertension: Secondary | ICD-10-CM | POA: Diagnosis not present

## 2019-12-28 NOTE — Progress Notes (Signed)
Cardiology Office Note   Date:  12/28/2019   ID:  Brett Marshall, DOB February 07, 1966, MRN [id number]  PCP:  [name], MD  Cardiologist:   [name], MD   Chief Complaint  Patient presents with  . OTHER    6 month f/u c/o Chest pain. Meds reviewed verbally with pt.     History of Present Illness: Brett Marshall is a 54 y.o. male who is here today for a follow-up visit regarding chronic systolic heart failure due to nonischemic cardiomyopathy.   The patient has multiple chronic medical conditions that include type 2 diabetes, essential hypertension, morbid obesity, obstructive sleep apnea on CPAP and degenerative disc disease.  Right and left cardiac catheterization done in April of 2019 showed mild nonobstructive coronary artery disease, mildly reduced LV systolic function with an EF of 45 to 50% with global hypokinesis.  Right heart catheterization showed moderately elevated filling pressures with a wedge pressure of 22 mmHg, mild pulmonary hypertension and high cardiac output.   He has been doing reasonably well with no significant dyspnea or worsening edema.  He had few episodes of sharp chest discomfort lasting for few seconds.  He has not been using CPAP on regular basis as he has hard time staying in one position.    Past Medical History:  Diagnosis Date  . Asthma   . CAD (coronary artery disease)    a. 08/2017 Cath: LM nl, lAD 30ost, 44m 30d, RI min irregs, LCX min irregs, RCA small, 40p, EF 45-50%, diff HK.  . DDD (degenerative disc disease), lumbar   . Depression   . Diabetes mellitus without complication ([place]   . Essential hypertension, benign   . HFrEF (heart failure with reduced ejection fraction) ([place]    a. 08/2017 LV gram: EF 45-50%, glob HK.  . Leg swelling    Left  . Low back pain   . Morbid obesity ([place]   . NICM (nonischemic cardiomyopathy) ([place]    a. 08/2017 LV gram: EF 45-50%, global HK.  . OSA on CPAP    non compliant  . Pulmonary  hypertension ([place]     Past Surgical History:  Procedure Laterality Date  . CARDIAC CATHETERIZATION     [place] no stents.  . COLONOSCOPY WITH PROPOFOL N/A 04/11/2019   Procedure: COLONOSCOPY WITH PROPOFOL;  Surgeon: WLucilla Lame MD;  [facility];  Service: Endoscopy;  Laterality: N/A;  . RIGHT/LEFT HEART CATH AND CORONARY ANGIOGRAPHY N/A 09/02/2017   Procedure: RIGHT/LEFT HEART CATH AND CORONARY ANGIOGRAPHY;  Surgeon: AWellington Hampshire MD;  [facility];  Service: Cardiovascular;  Laterality: N/A;     Current Outpatient Medications  Medication Sig Dispense Refill  . albuterol (PROVENTIL HFA;VENTOLIN HFA) 108 (90 Base) MCG/ACT inhaler Inhale 1-2 puffs into the lungs every 6 (six) hours as needed for wheezing or shortness of breath. 1 Inhaler 0  . atorvastatin (LIPITOR) 10 MG tablet Take 10 mg by mouth daily at 6 PM.     . carvedilol (COREG) 6.25 MG tablet Take 1 tablet (6.25 mg total) by mouth 2 (two) times daily. 60 tablet 5  . cyclobenzaprine (FLEXERIL) 10 MG tablet Take 1 tablet (10 mg total) by mouth 3 (three) times daily as needed.For spasms 90 tablet 5  . DULoxetine (CYMBALTA) 30 MG capsule Take 1 capsule (30 mg total) by mouth daily. (Patient taking differently: Take 60 mg by mouth daily. ) 90 capsule 1  . empagliflozin (JARDIANCE) 25 MG TABS  tablet Take 25 mg by mouth daily.     . enalapril (VASOTEC) 20 MG tablet Take 1 tablet (20 mg total) by mouth 2 (two) times daily. (Patient taking differently: Take 40 mg by mouth every morning. ) 60 tablet 5  . famotidine (PEPCID) 40 MG tablet Take 40 mg by mouth daily.     . furosemide (LASIX) 20 MG tablet Take 1 tablet (20 mg total) by mouth as needed (for SOB or weight gain of 3 lbs overnight). 90 tablet 3  . glipiZIDE (GLUCOTROL) 5 MG tablet Take 1 tablet (5 mg total) by mouth 2 (two) times daily before a meal. 180 tablet 1  . glucose blood (GE100 BLOOD GLUCOSE TEST) test strip Use as instructed 100 each  12  . HYDROcodone-acetaminophen (NORCO) 5-325 MG tablet Take 1 tablet by mouth every 6 (six) hours as needed for moderate pain. 15 tablet 0  . Lancets (ONETOUCH ULTRASOFT) lancets Check sugar twice daily 100 each 12  . metFORMIN (GLUCOPHAGE) 1000 MG tablet Take 1 tablet (1,000 mg total) by mouth 2 (two) times daily with a meal. 180 tablet 1   No current facility-administered medications for this visit.    Allergies:   Patient has no known allergies.    Social History:  The patient  reports that he has never smoked. He has never used smokeless tobacco. He reports current alcohol use. He reports that he does not use drugs.   Family History:  The patient's family history includes Asthma in his mother; Diabetes in his mother; Heart attack in his father; Heart disease in his father; Heart disease (age of onset: 78) in his brother; Sjogren's syndrome in his sister.    ROS:  Please see the history of present illness.   Otherwise, review of systems are positive for none.   All other systems are reviewed and negative.   PHYSICAL EXAM: VS:  BP 124/84 (BP Location: Left Arm, Patient Position: Sitting, Cuff Size: Large)   Pulse 97   Ht 5' 8"  (1.727 m)   Wt (!) 349 lb 2 oz (158.4 kg)   SpO2 98%   BMI 53.08 kg/m  , BMI Body mass index is 53.08 kg/m. GEN: Well nourished, well developed, in no acute distress  HEENT: normal  Neck: no JVD or masses.  Bilateral carotid bruits. Cardiac: RRR; norubs, or gallops.  1/6 systolic murmur at the aortic area.  Mild bilateral leg edema. Respiratory:  clear to auscultation bilaterally, normal work of breathing GI: soft, nontender, nondistended, + BS MS: no deformity or atrophy  Skin: warm and dry, no rash Neuro:  Strength and sensation are intact Psych: euthymic mood, full affect   EKG:  EKG is ordered today. The ekg ordered today demonstrates normal sinus rhythm with low voltage.   Recent Labs: 03/29/2019: BUN 19; Creatinine, Ser 1.13; Hemoglobin  13.5; Platelets 241; Potassium 4.7; Sodium 136    Lipid Panel    Component Value Date/Time   CHOL 176 12/08/2017 1150   TRIG 143 12/08/2017 1150   HDL 44 12/08/2017 1150   CHOLHDL 4.0 12/08/2017 1150   CHOLHDL 3.5 01/11/2016 1008   VLDL 17 01/11/2016 1008   LDLCALC 103 (H) 12/08/2017 1150      Wt Readings from Last 3 Encounters:  12/28/19 (!) 349 lb 2 oz (158.4 kg)  06/27/19 (!) 361 lb (163.7 kg)  04/11/19 (!) 364 lb (165.1 kg)     PAD Screen 08/27/2017  Previous PAD dx? No  Previous surgical procedure? No  Pain with walking? Yes  Subsides with rest? No  Feet/toe relief with dangling? Yes  Painful, non-healing ulcers? No  Extremities discolored? Yes      ASSESSMENT AND PLAN:  1.  Chronic systolic heart failure with mildly reduced LV systolic function due to nonischemic cardiomyopathy: EF of 45 to 50%.   Continue current dose of carvedilol and enalapril. He continues to use furosemide as needed and has not required this.  I requested a follow-up echocardiogram.  If EF is less than 40%, recommend switching enalapril to Entresto.  2.  Bilateral carotid bruits: Carotid Doppler showed normal carotid arteries.  3.  Essential hypertension: Blood pressures well controlled.  4.  Mild hyperlipidemia: Currently on atorvastatin 10 mg daily given that he is diabetic  5.  Sleep apnea: He has not been able to use his CPAP machine due to difficulty keeping it on at night.   Disposition:   FU with me in 6 month  Signed,  [name], MD  12/28/2019 8:39 AM    [place]

## 2019-12-28 NOTE — Telephone Encounter (Signed)
°  Patient Consent for Virtual Visit         Brett Marshall has provided verbal consent on 12/28/2019 for a virtual visit (video or telephone).   CONSENT FOR VIRTUAL VISIT FOR:  Brett Marshall  By participating in this virtual visit I agree to the following:  I hereby voluntarily request, consent and authorize [place] and its employed or contracted physicians, physician assistants, nurse practitioners or other licensed health care professionals (the Practitioner), to provide me with telemedicine health care services (the Services") as deemed necessary by the treating Practitioner. I acknowledge and consent to receive the Services by the Practitioner via telemedicine. I understand that the telemedicine visit will involve communicating with the Practitioner through live audiovisual communication technology and the disclosure of certain medical information by electronic transmission. I acknowledge that I have been given the opportunity to request an in-person assessment or other available alternative prior to the telemedicine visit and am voluntarily participating in the telemedicine visit.  I understand that I have the right to withhold or withdraw my consent to the use of telemedicine in the course of my care at any time, without affecting my right to future care or treatment, and that the Practitioner or I may terminate the telemedicine visit at any time. I understand that I have the right to inspect all information obtained and/or recorded in the course of the telemedicine visit and may receive copies of available information for a reasonable fee.  I understand that some of the potential risks of receiving the Services via telemedicine include:   Delay or interruption in medical evaluation due to technological equipment failure or disruption;  Information transmitted may not be sufficient (e.g. poor resolution of images) to allow for appropriate medical decision making by the Practitioner;  and/or   In rare instances, security protocols could fail, causing a breach of personal health information.  Furthermore, I acknowledge that it is my responsibility to provide information about my medical history, conditions and care that is complete and accurate to the best of my ability. I acknowledge that Practitioner's advice, recommendations, and/or decision may be based on factors not within their control, such as incomplete or inaccurate data provided by me or distortions of diagnostic images or specimens that may result from electronic transmissions. I understand that the practice of medicine is not an exact science and that Practitioner makes no warranties or guarantees regarding treatment outcomes. I acknowledge that a copy of this consent can be made available to me via my patient portal ([facility]), or I can request a printed copy by calling the office of [facility].    I understand that my insurance will be billed for this visit.   I have read or had this consent read to me.  I understand the contents of this consent, which adequately explains the benefits and risks of the Services being provided via telemedicine.   I have been provided ample opportunity to ask questions regarding this consent and the Services and have had my questions answered to my satisfaction.  I give my informed consent for the services to be provided through the use of telemedicine in my medical care

## 2019-12-28 NOTE — Patient Instructions (Signed)
Medication Instructions:  Your physician recommends that you continue on your current medications as directed. Please refer to the Current Medication list given to you today.  *If you need a refill on your cardiac medications before your next appointment, please call your pharmacy*   Lab Work: None ordered If you have labs (blood work) drawn today and your tests are completely normal, you will receive your results only by: Marland Kitchen MyChart Message (if you have MyChart) OR . A paper copy in the mail If you have any lab test that is abnormal or we need to change your treatment, we will call you to review the results.   Testing/Procedures: Your physician has requested that you have an echocardiogram. Echocardiography is a painless test that uses sound waves to create images of your heart. It provides your doctor with information about the size and shape of your heart and how well your heart's chambers and valves are working. This procedure takes approximately one hour. There are no restrictions for this procedure.     Follow-Up: At [facility], you and your health needs are our priority.  As part of our continuing mission to provide you with exceptional heart care, we have created designated Provider Care Teams.  These Care Teams include your primary Cardiologist (physician) and Advanced Practice Providers (APPs -  Physician Assistants and Nurse Practitioners) who all work together to provide you with the care you need, when you need it.  We recommend signing up for the patient portal called "MyChart".  Sign up information is provided on this After Visit Summary.  MyChart is used to connect with patients for Virtual Visits (Telemedicine).  Patients are able to view lab/test results, encounter notes, upcoming appointments, etc.  Non-urgent messages can be sent to your provider as well.   To learn more about what you can do with MyChart, go to [email or web address].    Your next appointment:   6  month(s)  The format for your next appointment:   In Person  Provider:    You may see [name], MD or one of the following Advanced Practice Providers on your designated Care Team:    Murray Hodgkins, NP  Christell Faith, PA-C  Marrianne Mood, PA-C    Other Instructions N/A

## 2020-01-19 ENCOUNTER — Other Ambulatory Visit: Payer: Self-pay

## 2020-01-19 ENCOUNTER — Ambulatory Visit (INDEPENDENT_AMBULATORY_CARE_PROVIDER_SITE_OTHER): Payer: BC Managed Care – PPO

## 2020-01-19 DIAGNOSIS — I5022 Chronic systolic (congestive) heart failure: Secondary | ICD-10-CM | POA: Diagnosis not present

## 2020-01-19 LAB — ECHOCARDIOGRAM COMPLETE
AR max vel: 3.84 cm2
AV Area VTI: 3.48 cm2
AV Area mean vel: 3.75 cm2
AV Mean grad: 6 mmHg
AV Peak grad: 11.7 mmHg
Ao pk vel: 1.71 m/s
Area-P 1/2: 5.02 cm2
S' Lateral: 2.5 cm

## 2020-01-19 MED ORDER — PERFLUTREN LIPID MICROSPHERE
1.0000 mL | INTRAVENOUS | Status: AC | PRN
  Administered 2020-01-19: 2 mL via INTRAVENOUS

## 2020-06-03 ENCOUNTER — Ambulatory Visit: Payer: BC Managed Care – PPO | Admitting: Gastroenterology

## 2020-06-25 ENCOUNTER — Encounter: Payer: Self-pay | Admitting: Cardiovascular Disease

## 2020-06-25 ENCOUNTER — Ambulatory Visit: Payer: BC Managed Care – PPO | Admitting: Cardiovascular Disease

## 2020-06-25 ENCOUNTER — Other Ambulatory Visit: Payer: Self-pay

## 2020-06-25 VITALS — BP 112/73 | HR 95 | Ht 68.0 in | Wt 353.0 lb

## 2020-06-25 DIAGNOSIS — E785 Hyperlipidemia, unspecified: Secondary | ICD-10-CM | POA: Diagnosis not present

## 2020-06-25 DIAGNOSIS — I5022 Chronic systolic (congestive) heart failure: Secondary | ICD-10-CM

## 2020-06-25 DIAGNOSIS — R0989 Other specified symptoms and signs involving the circulatory and respiratory systems: Secondary | ICD-10-CM

## 2020-06-25 DIAGNOSIS — I10 Essential (primary) hypertension: Secondary | ICD-10-CM | POA: Diagnosis not present

## 2020-06-25 DIAGNOSIS — G4730 Sleep apnea, unspecified: Secondary | ICD-10-CM

## 2020-06-25 MED ORDER — FUROSEMIDE 20 MG PO TABS: 20.0000 mg | ORAL_TABLET | Freq: Every day | ORAL | 1 refills | Status: DC | PRN

## 2020-06-25 NOTE — Patient Instructions (Signed)
Medication Instructions:  Your physician recommends that you continue on your current medications as directed. Please refer to the Current Medication list given to you today.  *If you need a refill on your cardiac medications before your next appointment, please call your pharmacy*   Lab Work: None ordered If you have labs (blood work) drawn today and your tests are completely normal, you will receive your results only by: Marland Kitchen MyChart Message (if you have MyChart) OR . A paper copy in the mail If you have any lab test that is abnormal or we need to change your treatment, we will call you to review the results.   Testing/Procedures: None ordered   Follow-Up: At [facility], you and your health needs are our priority.  As part of our continuing mission to provide you with exceptional heart care, we have created designated Provider Care Teams.  These Care Teams include your primary Cardiologist (physician) and Advanced Practice Providers (APPs -  Physician Assistants and Nurse Practitioners) who all work together to provide you with the care you need, when you need it.  We recommend signing up for the patient portal called "MyChart".  Sign up information is provided on this After Visit Summary.  MyChart is used to connect with patients for Virtual Visits (Telemedicine).  Patients are able to view lab/test results, encounter notes, upcoming appointments, etc.  Non-urgent messages can be sent to your provider as well.   To learn more about what you can do with MyChart, go to [email or web address].    Your next appointment:   6 month(s)  The format for your next appointment:   In Person  Provider:   You may see [name], MD or one of the following Advanced Practice Providers on your designated Care Team:    Murray Hodgkins, NP  Christell Faith, PA-C  Marrianne Mood, PA-C  Cadence Gillespie, [place]  Laurann Montana, NP    Other Instructions N/A

## 2020-06-25 NOTE — Progress Notes (Signed)
Cardiology Office Note   Date:  06/25/2020   ID:  Brett Marshall, DOB 08-06-1965, MRN [id number]  PCP:  [name], MD  Cardiologist:   [name], MD   Chief Complaint  Patient presents with  . Follow-up    6 months  Pt states he has been having left sided chest pain and some SOB. States he has occasional sharp pain in his head.      History of Present Illness: Brett Marshall is a 55 y.o. male who is here today for a follow-up visit regarding chronic systolic heart failure due to nonischemic cardiomyopathy.   The patient has multiple chronic medical conditions that include type 2 diabetes, essential hypertension, morbid obesity, obstructive sleep apnea on CPAP and degenerative disc disease.  Right and left cardiac catheterization done in April of 2019 showed mild nonobstructive coronary artery disease, mildly reduced LV systolic function with an EF of 45 to 50% with global hypokinesis.  Right heart catheterization showed moderately elevated filling pressures with a wedge pressure of 22 mmHg, mild pulmonary hypertension and high cardiac output.   He had a repeat echocardiogram done in September 2021 which showed an EF of 60 to 65% with grade 1 diastolic dysfunction.  He reports left-sided chest pain described as sharp discomfort lasting for few seconds and sometimes few minutes.  This is not exertional.  In addition, he reports headaches.  Shortness of breath is stable.  He uses furosemide as needed only.  Past Medical History:  Diagnosis Date  . Asthma   . CAD (coronary artery disease)    a. 08/2017 Cath: LM nl, lAD 30ost, 64m 30d, RI min irregs, LCX min irregs, RCA small, 40p, EF 45-50%, diff HK.  . DDD (degenerative disc disease), lumbar   . Depression   . [facility] mellitus without complication ([facility]   . Essential hypertension, benign   . HFrEF (heart failure with reduced ejection fraction) ([facility]    a. 08/2017 LV gram: EF 45-50%, glob HK.  . Leg swelling     Left  . Low back pain   . Morbid obesity ([facility]   . NICM (nonischemic cardiomyopathy) ([facility]    a. 08/2017 LV gram: EF 45-50%, global HK.  . OSA on CPAP    non compliant  . Pulmonary hypertension ([facility]     Past Surgical History:  Procedure Laterality Date  . CARDIAC CATHETERIZATION     [place] no stents.  . COLONOSCOPY WITH PROPOFOL N/A 04/11/2019   Procedure: COLONOSCOPY WITH PROPOFOL;  Surgeon: WLucilla Lame MD;  [facility];  Service: Endoscopy;  Laterality: N/A;  . RIGHT/LEFT HEART CATH AND CORONARY ANGIOGRAPHY N/A 09/02/2017   Procedure: RIGHT/LEFT HEART CATH AND CORONARY ANGIOGRAPHY;  Surgeon: AWellington Hampshire MD;  [facility];  Service: Cardiovascular;  Laterality: N/A;     Current Outpatient Medications  Medication Sig Dispense Refill  . albuterol (PROVENTIL HFA;VENTOLIN HFA) 108 (90 Base) MCG/ACT inhaler Inhale 1-2 puffs into the lungs every 6 (six) hours as needed for wheezing or shortness of breath. 1 Inhaler 0  . carvedilol (COREG) 6.25 MG tablet Take 1 tablet (6.25 mg total) by mouth 2 (two) times daily. 60 tablet 5  . cyclobenzaprine (FLEXERIL) 10 MG tablet Take 1 tablet (10 mg total) by mouth 3 (three) times daily as needed.For spasms 90 tablet 5  . DULoxetine (CYMBALTA) 30 MG capsule Take 1 capsule (30 mg total) by mouth daily. (Patient taking differently: Take 60 mg by  mouth daily.) 90 capsule 1  . enalapril (VASOTEC) 20 MG tablet Take 1 tablet (20 mg total) by mouth 2 (two) times daily. (Patient taking differently: Take 40 mg by mouth every morning.) 60 tablet 5  . famotidine (PEPCID) 40 MG tablet Take 40 mg by mouth daily.     . furosemide (LASIX) 20 MG tablet Take 1 tablet (20 mg total) by mouth as needed (for SOB or weight gain of 3 lbs overnight). 90 tablet 3  . glipiZIDE (GLUCOTROL) 5 MG tablet Take 1 tablet (5 mg total) by mouth 2 (two) times daily before a meal. 180 tablet 1  . glucose blood (GE100 BLOOD GLUCOSE TEST) test strip  Use as instructed 100 each 12  . HYDROcodone-acetaminophen (NORCO) 5-325 MG tablet Take 1 tablet by mouth every 6 (six) hours as needed for moderate pain. 15 tablet 0  . Lancets (ONETOUCH ULTRASOFT) lancets Check sugar twice daily 100 each 12  . metFORMIN (GLUCOPHAGE) 1000 MG tablet Take 1 tablet (1,000 mg total) by mouth 2 (two) times daily with a meal. 180 tablet 1  . atorvastatin (LIPITOR) 10 MG tablet Take 10 mg by mouth daily at 6 PM.      No current facility-administered medications for this visit.    Allergies:   Patient has no known allergies.    Social History:  The patient  reports that he has never smoked. He has never used smokeless tobacco. He reports current alcohol use. He reports that he does not use drugs.   Family History:  The patient's family history includes Asthma in his mother; Diabetes in his mother; Heart attack in his father; Heart disease in his father; Heart disease (age of onset: 37) in his brother; Sjogren's syndrome in his sister.    ROS:  Please see the history of present illness.   Otherwise, review of systems are positive for none.   All other systems are reviewed and negative.   PHYSICAL EXAM: VS:  BP 112/73   Pulse 95   Ht 5' 8"  (1.727 m)   Wt (!) 353 lb (160.1 kg)   BMI 53.67 kg/m  , BMI Body mass index is 53.67 kg/m. GEN: Well nourished, well developed, in no acute distress  HEENT: normal  Neck: no JVD or masses.  Bilateral carotid bruits. Cardiac: RRR; norubs, or gallops.  1/6 systolic murmur at the aortic area.  Mild bilateral leg edema. Respiratory:  clear to auscultation bilaterally, normal work of breathing GI: soft, nontender, nondistended, + BS MS: no deformity or atrophy  Skin: warm and dry, no rash Neuro:  Strength and sensation are intact Psych: euthymic mood, full affect   EKG:  EKG is ordered today. The ekg ordered today demonstrates normal sinus rhythm with low voltage.   Recent Labs: No results found for requested labs  within last 8760 hours.    Lipid Panel    Component Value Date/Time   CHOL 176 12/08/2017 1150   TRIG 143 12/08/2017 1150   HDL 44 12/08/2017 1150   CHOLHDL 4.0 12/08/2017 1150   CHOLHDL 3.5 01/11/2016 1008   VLDL 17 01/11/2016 1008   LDLCALC 103 (H) 12/08/2017 1150      Wt Readings from Last 3 Encounters:  06/25/20 (!) 353 lb (160.1 kg)  12/28/19 (!) 349 lb 2 oz (158.4 kg)  06/27/19 (!) 361 lb (163.7 kg)     PAD Screen 08/27/2017  Previous PAD dx? No  Previous surgical procedure? No  Pain with walking? Yes  Subsides with  rest? No  Feet/toe relief with dangling? Yes  Painful, non-healing ulcers? No  Extremities discolored? Yes      ASSESSMENT AND PLAN:  1.  Chronic systolic heart failure: Most recent echocardiogram showed improvement in ejection fraction to normal.  He appears to be euvolemic and has not required furosemide.  Continue current dose of carvedilol and enalapril.  2.  Bilateral carotid bruits: Carotid Doppler showed normal carotid arteries.  3.  Essential hypertension: Blood pressures well controlled.  4.  Mild hyperlipidemia: Currently on atorvastatin 10 mg daily given that he is diabetic  5.  Sleep apnea: He has not been able to use his CPAP machine due to difficulty keeping it on at night.  6.  Morbid obesity: Discussed options for weight loss.  Given multiple comorbidities related to his weight, surgical weight loss should be considered if everything else fails.    Disposition:   FU with me in 6 month  Signed,  [name], MD  06/25/2020 4:15 PM    [facility]

## 2020-07-03 ENCOUNTER — Encounter: Payer: Self-pay | Admitting: Gastroenterology

## 2020-07-03 ENCOUNTER — Other Ambulatory Visit: Payer: Self-pay

## 2020-07-03 ENCOUNTER — Ambulatory Visit (INDEPENDENT_AMBULATORY_CARE_PROVIDER_SITE_OTHER): Payer: BC Managed Care – PPO | Admitting: Gastroenterology

## 2020-07-03 VITALS — BP 112/73 | HR 98 | Temp 97.7°F | Ht 68.0 in | Wt 352.0 lb

## 2020-07-03 DIAGNOSIS — K921 Melena: Secondary | ICD-10-CM | POA: Diagnosis not present

## 2020-07-03 NOTE — Progress Notes (Signed)
Primary Care Physician: [name], MD  Primary Gastroenterologist:  Dr. [name]  Chief Complaint  Patient presents with  . New Patient (Initial Visit)    Abdominal pain    HPI: Brett Marshall is a 55 y.o. male here with a history of abdominal pain.  The patient saw me in September 2019 for generalized abdominal pain and at that time was found to have musculoskeletal pain that was clearly reproducible with flexion of the abdominal wall muscles.  The patient was also reporting back pain associated with the front pain and the patient was taking Flexeril and anti-inflammatories.  Subsequent to that the patient underwent a colonoscopy with multiple polyps found at that time the patient was recommended to have repeat colonoscopy in 5 years.  The patient reports that his abdominal pain is better but he has been having rectal bleeding.  The patient reports that the rectal bleeding is anywhere from pink to red and happens with every bowel movement.  He also reports that it is sometimes mixed with the stool.  There is no report of any change in bowel habits or unexplained weight loss.  The patient's abdominal pain has been present but bothering him last.  Past Medical History:  Diagnosis Date  . Asthma   . CAD (coronary artery disease)    a. 08/2017 Cath: LM nl, lAD 30ost, 41m 30d, RI min irregs, LCX min irregs, RCA small, 40p, EF 45-50%, diff HK.  . DDD (degenerative disc disease), lumbar   . Depression   . Diabetes mellitus without complication ([facility]   . Essential hypertension, benign   . HFrEF (heart failure with reduced ejection fraction) ([facility]    a. 08/2017 LV gram: EF 45-50%, glob HK.  . Leg swelling    Left  . Low back pain   . Morbid obesity ([facility]   . NICM (nonischemic cardiomyopathy) ([facility]    a. 08/2017 LV gram: EF 45-50%, global HK.  . OSA on CPAP    non compliant  . Pulmonary hypertension ([facility]     Current Outpatient Medications  Medication Sig Dispense Refill  .  carvedilol (COREG) 6.25 MG tablet Take 1 tablet (6.25 mg total) by mouth 2 (two) times daily. 60 tablet 5  . cyclobenzaprine (FLEXERIL) 10 MG tablet Take 1 tablet (10 mg total) by mouth 3 (three) times daily as needed.For spasms 90 tablet 5  . DULoxetine (CYMBALTA) 30 MG capsule Take 1 capsule (30 mg total) by mouth daily. (Patient taking differently: Take 60 mg by mouth daily.) 90 capsule 1  . enalapril (VASOTEC) 20 MG tablet Take 1 tablet (20 mg total) by mouth 2 (two) times daily. (Patient taking differently: Take 40 mg by mouth every morning.) 60 tablet 5  . famotidine (PEPCID) 40 MG tablet Take 40 mg by mouth daily.     . furosemide (LASIX) 20 MG tablet Take 1 tablet (20 mg total) by mouth daily as needed (for SOB or weight gain of 3 lbs overnight). 90 tablet 1  . glipiZIDE (GLUCOTROL) 5 MG tablet Take 1 tablet (5 mg total) by mouth 2 (two) times daily before a meal. 180 tablet 1  . glucose blood (GE100 BLOOD GLUCOSE TEST) test strip Use as instructed 100 each 12  . HYDROcodone-acetaminophen (NORCO) 5-325 MG tablet Take 1 tablet by mouth every 6 (six) hours as needed for moderate pain. 15 tablet 0  . Lancets (ONETOUCH ULTRASOFT) lancets Check sugar twice daily 100 each 12  . metFORMIN (GLUCOPHAGE) 1000  MG tablet Take 1 tablet (1,000 mg total) by mouth 2 (two) times daily with a meal. 180 tablet 1  . albuterol (PROVENTIL HFA;VENTOLIN HFA) 108 (90 Base) MCG/ACT inhaler Inhale 1-2 puffs into the lungs every 6 (six) hours as needed for wheezing or shortness of breath. (Patient not taking: Reported on 07/03/2020) 1 Inhaler 0  . atorvastatin (LIPITOR) 10 MG tablet Take 10 mg by mouth daily at 6 PM.      No current facility-administered medications for this visit.    Allergies as of 07/03/2020  . (No Known Allergies)    ROS:  General: Negative for anorexia, weight loss, fever, chills, fatigue, weakness. ENT: Negative for hoarseness, difficulty swallowing , nasal congestion. CV: Negative for  chest pain, angina, palpitations, dyspnea on exertion, peripheral edema.  Respiratory: Negative for dyspnea at rest, dyspnea on exertion, cough, sputum, wheezing.  GI: See history of present illness. GU:  Negative for dysuria, hematuria, urinary incontinence, urinary frequency, nocturnal urination.  Endo: Negative for unusual weight change.    Physical Examination:   BP 112/73   Pulse 98   Temp 97.7 F (36.5 C) (Temporal)   Ht 5' 8"  (1.727 m)   Wt (!) 352 lb (159.7 kg)   BMI 53.52 kg/m   General: Well-nourished, well-developed in no acute distress.  Eyes: No icterus. Conjunctivae pink. Lungs: Clear to auscultation bilaterally. Non-labored. Heart: Regular rate and rhythm, no murmurs rubs or gallops.  Abdomen: Bowel sounds are normal, nontender, nondistended, no hepatosplenomegaly or masses, no abdominal bruits or hernia , no rebound or guarding.   Extremities: No lower extremity edema. No clubbing or deformities. Neuro: Alert and oriented x 3.  Grossly intact. Skin: Warm and dry, no jaundice.   Psych: Alert and cooperative, normal mood and affect.  Labs:    Imaging Studies: No results found.  Assessment and Plan:   Brett Marshall is a 55 y.o. y/o male who comes in today with a history of abdominal pain which is reproducible with flexion of the abdominal wall muscles today again.  The patient has had rectal bleeding and will be set up for hemorrhoidal banding since he was found to have enlarged hemorrhoids at his colonoscopy approximately 1-1/2 years ago.  The patient has been told that if his bleeding continues after eradication of the hemorrhoids then he may need to consider repeat colonoscopy.  The patient has been explained the plan and agrees with it.     [name], MD. [facility]    Note: This dictation was prepared with Dragon dictation along with smaller phrase technology. Any transcriptional errors that result from this process are unintentional.

## 2020-07-08 ENCOUNTER — Ambulatory Visit: Payer: BC Managed Care – PPO | Admitting: Gastroenterology

## 2020-07-08 ENCOUNTER — Other Ambulatory Visit: Payer: Self-pay

## 2020-07-08 VITALS — BP 114/79 | HR 102 | Temp 98.0°F | Ht 68.0 in | Wt 344.0 lb

## 2020-07-08 DIAGNOSIS — K921 Melena: Secondary | ICD-10-CM | POA: Diagnosis not present

## 2020-07-08 DIAGNOSIS — K648 Other hemorrhoids: Secondary | ICD-10-CM | POA: Diagnosis not present

## 2020-07-08 NOTE — Progress Notes (Signed)
Brett Bellows MD, MRCP(U.K) [address]  [place]  [place], [place] [address]  Main: [phone]  Fax: [phone]   Primary Care Physician: [name], MD  Primary Gastroenterologist:  Dr. Jonathon Marshall   Banding of hemorrhoids   HPI: Brett Marshall is a 55 y.o. male seen by Dr Allen Norris on 07/03/2020 for rectal bleeding. Colonoscopy by Dr Allen Norris in 2020 showed large internal hemorrhoids. Hb 13.5 grams He has been referred for banding.  He states he has had long-term issues with blood on the tissue paper associated with the bowel movement.  Also had perianal itching.  Denies any constipation.  At times and noted blood on the stool.  Has tried conservative management which has not worked and hence he is here today to see me for banding denies any use of any blood thinners   Current Outpatient Medications  Medication Sig Dispense Refill  . albuterol (PROVENTIL HFA;VENTOLIN HFA) 108 (90 Base) MCG/ACT inhaler Inhale 1-2 puffs into the lungs every 6 (six) hours as needed for wheezing or shortness of breath. (Patient not taking: Reported on 07/03/2020) 1 Inhaler 0  . atorvastatin (LIPITOR) 10 MG tablet Take 10 mg by mouth daily at 6 PM.     . carvedilol (COREG) 6.25 MG tablet Take 1 tablet (6.25 mg total) by mouth 2 (two) times daily. 60 tablet 5  . cyclobenzaprine (FLEXERIL) 10 MG tablet Take 1 tablet (10 mg total) by mouth 3 (three) times daily as needed.For spasms 90 tablet 5  . DULoxetine (CYMBALTA) 30 MG capsule Take 1 capsule (30 mg total) by mouth daily. (Patient taking differently: Take 60 mg by mouth daily.) 90 capsule 1  . enalapril (VASOTEC) 20 MG tablet Take 1 tablet (20 mg total) by mouth 2 (two) times daily. (Patient taking differently: Take 40 mg by mouth every morning.) 60 tablet 5  . famotidine (PEPCID) 40 MG tablet Take 40 mg by mouth daily.     . furosemide (LASIX) 20 MG tablet Take 1 tablet (20 mg total) by mouth daily as needed (for SOB or weight gain of 3 lbs  overnight). 90 tablet 1  . glipiZIDE (GLUCOTROL) 5 MG tablet Take 1 tablet (5 mg total) by mouth 2 (two) times daily before a meal. 180 tablet 1  . glucose blood (GE100 BLOOD GLUCOSE TEST) test strip Use as instructed 100 each 12  . HYDROcodone-acetaminophen (NORCO) 5-325 MG tablet Take 1 tablet by mouth every 6 (six) hours as needed for moderate pain. 15 tablet 0  . JARDIANCE 25 MG TABS tablet Take 25 mg by mouth daily.    . Lancets (ONETOUCH ULTRASOFT) lancets Check sugar twice daily 100 each 12  . metFORMIN (GLUCOPHAGE) 1000 MG tablet Take 1 tablet (1,000 mg total) by mouth 2 (two) times daily with a meal. 180 tablet 1   No current facility-administered medications for this visit.    Allergies as of 07/08/2020  . (No Known Allergies)    ROS:  General: Negative for anorexia, weight loss, fever, chills, fatigue, weakness. ENT: Negative for hoarseness, difficulty swallowing , nasal congestion. CV: Negative for chest pain, angina, palpitations, dyspnea on exertion, peripheral edema.  Respiratory: Negative for dyspnea at rest, dyspnea on exertion, cough, sputum, wheezing.  GI: See history of present illness. GU:  Negative for dysuria, hematuria, urinary incontinence, urinary frequency, nocturnal urination.  Endo: Negative for unusual weight change.    Physical Examination:   BP 114/79   Pulse (!) 102   Temp 98 F (  36.7 C)   Ht 5\' 8"  (1.727 m)   Wt (!) 344 lb (156 kg)   BMI 52.31 kg/m   General: Well-nourished, well-developed in no acute distress.  Rectal exam: Chaperone present in the room: Externally no hemorrhoids noted.  No masses pain discomfort noted on internal exam PROCEDURE NOTE: Chaperone present in the room Jadijah CMA The patient presents with symptomatic grade 1 hemorrhoids, unresponsive to maximal medical therapy, requesting rubber band ligation of his/her hemorrhoidal disease.  All risks, benefits and alternative forms of therapy were described and informed consent  was obtained.  In the Left Lateral Decubitus position (if anoscopy is performed) anoscopic examination revealed grade 2 hemorrhoids in the all position(s).   The decision was made to band the RA internal hemorrhoid, and the [place] was used to perform band ligation without complication.  Digital anorectal examination was then performed to assure proper positioning of the band, and to adjust the banded tissue as required.  The patient was discharged home without pain or other issues.  Dietary and behavioral recommendations were given and (if necessary - prescriptions were given), along with follow-up instructions.  The patient will return 4 weeks for follow-up and possible additional banding as required.  No complications were encountered and the patient tolerated the procedure well.      Imaging Studies: No results found.  Assessment and Plan:   Brett Marshall is a 55 y.o. y/o male here for banding of internal hemorrhoids that have been symptomatic.  Failed conservative management.  Banded right anterior column.  Suggest stool softeners and high-fiber diet.      Dr Brett Bellows  MD,MRCP [facility]) Follow up in 4 weeks for continuation of treatment

## 2020-08-05 ENCOUNTER — Ambulatory Visit: Payer: BC Managed Care – PPO | Admitting: Gastroenterology

## 2020-08-05 ENCOUNTER — Other Ambulatory Visit: Payer: Self-pay

## 2020-08-05 ENCOUNTER — Encounter: Payer: Self-pay | Admitting: Gastroenterology

## 2020-08-05 VITALS — BP 104/70 | HR 102 | Ht 68.0 in | Wt 344.0 lb

## 2020-08-05 DIAGNOSIS — K648 Other hemorrhoids: Secondary | ICD-10-CM

## 2020-08-05 NOTE — Progress Notes (Signed)
Patient follow-ups today for banding of hemorrhoids    Summary of history :  Brett Marshall is a 55 y.o. male seen by Dr Allen Norris on 07/03/2020 for rectal bleeding. Colonoscopy by Dr Allen Norris in 2020 showed large internal hemorrhoids. Hb 13.5 grams He has been referred for banding.  He states he has had long-term issues with blood on the tissue paper associated with the bowel movement.  Also had perianal itching.  Denies any constipation.  At times and noted blood on the stool.  Has tried conservative management which has not worked    First round:07/08/2020: RA internal hemorrhoid was banding    Interval history  07/08/2020-08/05/2020  80% reduction in bleeding since his initial banding  Digital rectal exam performed in the presence of a chaperone. External anal findings: No abnormalities Internal findings: Normal, No masses, no blood on glove noticed.    PROCEDURE NOTE: The patient presents with symptomatic grade 2 hemorrhoids, unresponsive to maximal medical therapy, requesting rubber band ligation of his/her hemorrhoidal disease.  All risks, benefits and alternative forms of therapy were described and informed consent was obtained.  In the Left Lateral Decubitus position (if anoscopy is performed) anoscopic examination revealed grade 2 hemorrhoids in the RP and LL position(s).   The decision was made to band the LL internal hemorrhoid, and the [place] was used to perform band ligation without complication.  Digital anorectal examination was then performed to assure proper positioning of the band, and to adjust the banded tissue as required.  The patient was discharged home without pain or other issues.  Dietary and behavioral recommendations were given and (if necessary - prescriptions were given), along with follow-up instructions.  The patient will return 4 weeks for follow-up and possible additional banding as required.  No complications were encountered and the patient  tolerated the procedure well.   Plan:  1. Avoid constipation.  Commence on stool softeners if not already on  Follow-up:4 weeks   Dr Jonathon Bellows MD,MRCP [facility]) Gastroenterology/Hepatology Pager: [phone]

## 2020-09-03 ENCOUNTER — Ambulatory Visit: Payer: BC Managed Care – PPO | Admitting: Gastroenterology

## 2020-09-03 ENCOUNTER — Other Ambulatory Visit: Payer: Self-pay

## 2020-09-03 DIAGNOSIS — K648 Other hemorrhoids: Secondary | ICD-10-CM

## 2020-09-03 NOTE — Progress Notes (Signed)
Patient follow-ups today for banding of hemorrhoids    Summary of history : Brett Marshall a 55 y.o.maleseen by Dr Allen Norris on 07/03/2020 for rectal bleeding. Colonoscopy by Dr Allen Norris in 2020 showed large internal hemorrhoids. Hb 13.5 grams He has been referred for banding.He stateshe has had long-term issues with blood on the tissue paper associated with the bowel movement. Also had perianal itching. Denies any constipation. At times and noted blood on the stool. Has tried conservative management which has not worked    First round:07/08/2020: RA internal hemorrhoid was banding  Second round 08/05/2020: Left lateral internal hemorrhoid was banded.   Interval history  08/05/2020-09/03/2020  Still has occasional bleeding despite banding.   Digital rectal exam performed in the presence of a chaperone. External anal findings: No abnormalities Internal findings: , No masses, no blood on glove noticed.    PROCEDURE NOTE: The patient presents with symptomatic grade 1 hemorrhoids, unresponsive to maximal medical therapy, requesting rubber band ligation of his/her hemorrhoidal disease.  All risks, benefits and alternative forms of therapy were described and informed consent was obtained.  In the Left Lateral Decubitus position (if anoscopy is performed) anoscopic examination revealed grade 1 hemorrhoids in the RA and RP position(s).   The decision was made to band the RP internal hemorrhoid, and the [place] was used to perform band ligation without complication.  Digital anorectal examination was then performed to assure proper positioning of the band, and to adjust the banded tissue as required.  The patient was discharged home without pain or other issues.  Dietary and behavioral recommendations were given and (if necessary - prescriptions were given), along with follow-up instructions.  The patient will return as needed for follow-up and possible additional banding as  required.  No complications were encountered and the patient tolerated the procedure well.   Plan:  1. Avoid constipation.  Commence on stool softeners if not already on.  If bleeding persists can return to band the right anterior column  Follow-up:as needed   Dr Jonathon Bellows MD,MRCP [facility]) Gastroenterology/Hepatology Pager: [phone]

## 2020-09-10 NOTE — Progress Notes (Signed)
@Patient  ID: Brett Marshall, male    DOB: [birth year], 55 y.o.   MRN: [id number]  Chief Complaint  Patient presents with  . New Patient (Initial Visit)    OSA- when waking up in the morning still feeling tired, excessive daytime sleepiness. Mask is not fitting well. Pressure doesn't feel high enough. (5-20)    Referring provider: [name], MD  HPI: 55 year old male, never smoked.  Past medical history significant for hypertension, chronic systolic congestive heart failure, pulmonary hypertension, OSA on CPAP, GERD, NASH, type 2 diabetes, morbid obesity.  Former patient of Dr. Ashby Dawes for initial consult on 12/30/2017 for excessive daytime sleepiness.  HST 12/30/2017 showed moderate obstructive sleep apnea, AHI 20/hr with SPO2 low 78%. Patient had old CPAP machine and wanted to use this, ResMed series 8.  DME not able to accommodate settings.  Patient applied for CPAP assistance program.  09/11/2020 Patient presents today for overdue follow-up OSA. Fromer Dr. Ashby Dawes patient. He has been off cpap for 6-8 months d/t mask fit issues. No recent download, pressure 5-20cm h20. Feels he is not getting enough pressure at times. He wears a full face mask, lays on his side which causes mask to slide off. He ordered supplies through [email or web address]. He also reports chest tightness with exertion or at rest for > 1 year. Associated dyspnea on exertion. No significant cough or wheezing. Associated post nasal drip. Works as Dance movement psychotherapist for [employer]. He has three cats and dog at home. Some exposure to black mold. He has lost 100 lbs in the last 2 years.    No Known Allergies  Immunization History  Administered Date(s) Administered  . Influenza,inj,Quad PF,6+ Mos 02/21/2019, 04/01/2020  . Influenza-Unspecified 08/04/2018  . PFIZER(Purple Top)SARS-COV-2 Vaccination 08/08/2019, 08/29/2019, 04/01/2020  . Pneumococcal Polysaccharide-23 02/21/2019  . Tdap 01/20/2011  . Zoster Recombinat (Shingrix)  02/21/2019, 05/30/2019    Past Medical History:  Diagnosis Date  . Asthma   . CAD (coronary artery disease)    a. 08/2017 Cath: LM nl, lAD 30ost, 14m 30d, RI min irregs, LCX min irregs, RCA small, 40p, EF 45-50%, diff HK.  . DDD (degenerative disc disease), lumbar   . Depression   . Diabetes mellitus without complication ([facility]   . Essential hypertension, benign   . HFrEF (heart failure with reduced ejection fraction) ([place]    a. 08/2017 LV gram: EF 45-50%, glob HK.  . Leg swelling    Left  . Low back pain   . Morbid obesity ([facility]   . NICM (nonischemic cardiomyopathy) ([facility]    a. 08/2017 LV gram: EF 45-50%, global HK.  . OSA on CPAP    non compliant  . Pulmonary hypertension (HCC)     Tobacco History: Social History   Tobacco Use  Smoking Status Never Smoker  Smokeless Tobacco Never Used   Counseling given: Not Answered   Outpatient Medications Prior to Visit  Medication Sig Dispense Refill  . carvedilol (COREG) 6.25 MG tablet Take 1 tablet (6.25 mg total) by mouth 2 (two) times daily. 60 tablet 5  . cyclobenzaprine (FLEXERIL) 10 MG tablet Take 1 tablet (10 mg total) by mouth 3 (three) times daily as needed.For spasms 90 tablet 5  . DULoxetine (CYMBALTA) 30 MG capsule Take 1 capsule (30 mg total) by mouth daily. (Patient taking differently: Take 60 mg by mouth daily.) 90 capsule 1  . enalapril (VASOTEC) 20 MG tablet Take 1 tablet (20 mg total) by mouth 2 (two) times daily. (Patient taking differently: Take  40 mg by mouth every morning.) 60 tablet 5  . famotidine (PEPCID) 40 MG tablet Take 40 mg by mouth daily.     . furosemide (LASIX) 20 MG tablet Take 1 tablet (20 mg total) by mouth daily as needed (for SOB or weight gain of 3 lbs overnight). 90 tablet 1  . glipiZIDE (GLUCOTROL) 5 MG tablet Take 1 tablet (5 mg total) by mouth 2 (two) times daily before a meal. 180 tablet 1  . glucose blood (GE100 BLOOD GLUCOSE TEST) test strip Use as instructed 100 each 12  .  HYDROcodone-acetaminophen (NORCO) 5-325 MG tablet Take 1 tablet by mouth every 6 (six) hours as needed for moderate pain. 15 tablet 0  . JARDIANCE 25 MG TABS tablet Take 25 mg by mouth daily.    . Lancets (ONETOUCH ULTRASOFT) lancets Check sugar twice daily 100 each 12  . metFORMIN (GLUCOPHAGE) 1000 MG tablet Take 1 tablet (1,000 mg total) by mouth 2 (two) times daily with a meal. 180 tablet 1  . albuterol (PROVENTIL HFA;VENTOLIN HFA) 108 (90 Base) MCG/ACT inhaler Inhale 1-2 puffs into the lungs every 6 (six) hours as needed for wheezing or shortness of breath. 1 Inhaler 0  . atorvastatin (LIPITOR) 10 MG tablet Take 10 mg by mouth daily at 6 PM.      No facility-administered medications prior to visit.   Review of Systems  Review of Systems  Respiratory: Positive for chest tightness and shortness of breath. Negative for wheezing.   Cardiovascular: Positive for leg swelling.   Physical Exam  BP 124/68 (BP Location: Left Arm, Patient Position: Sitting, Cuff Size: Normal)   Pulse 97   Temp (!) 97.1 F (36.2 C) (Temporal)   Ht 5' 8"  (1.727 m)   Wt (!) 341 lb (154.7 kg)   SpO2 99%   BMI 51.85 kg/m  Physical Exam Constitutional:      Appearance: Normal appearance.  Cardiovascular:     Rate and Rhythm: Normal rate and regular rhythm.  Pulmonary:     Effort: Pulmonary effort is normal.     Breath sounds: Normal breath sounds. No wheezing, rhonchi or rales.  Neurological:     General: No focal deficit present.     Mental Status: He is alert and oriented to person, place, and time. Mental status is at baseline.  Psychiatric:        Mood and Affect: Mood normal.        Behavior: Behavior normal.        Thought Content: Thought content normal.        Judgment: Judgment normal.      Lab Results:  CBC    Component Value Date/Time   WBC 7.7 03/29/2019 1337   RBC 4.69 03/29/2019 1337   HGB 13.5 03/29/2019 1337   HGB 13.4 12/08/2017 1151   HCT 40.4 03/29/2019 1337   HCT 40.8  12/08/2017 1151   PLT 241 03/29/2019 1337   PLT 249 12/08/2017 1151   MCV 86.1 03/29/2019 1337   MCV 86 12/08/2017 1151   MCH 28.8 03/29/2019 1337   MCHC 33.4 03/29/2019 1337   RDW 13.8 03/29/2019 1337   RDW 14.2 12/08/2017 1151   LYMPHSABS 2.1 12/08/2017 1151   MONOABS 648 02/08/2016 1158   EOSABS 0.3 12/08/2017 1151   BASOSABS 0.0 12/08/2017 1151    BMET    Component Value Date/Time   NA 136 03/29/2019 1337   NA 139 12/08/2017 1150   K 4.7 03/29/2019 1337   CL  103 03/29/2019 1337   CO2 25 03/29/2019 1337   GLUCOSE 184 (H) 03/29/2019 1337   BUN 19 03/29/2019 1337   BUN 22 12/08/2017 1150   CREATININE 1.13 03/29/2019 1337   CREATININE 0.66 (L) 02/08/2016 1158   CALCIUM 9.2 03/29/2019 1337   GFRNONAA >60 03/29/2019 1337   GFRAA >60 03/29/2019 1337    BNP No results found for: BNP  ProBNP No results found for: PROBNP  Imaging: No results found.   Assessment & Plan:   OSA on CPAP - Stopped wearing CPAP several months ago d.t mask issues  - Plan adjust CPAP pressure to 10-20cm h20 and recommend he look into getting CPAP pillow for side sleepers, if this does not work may need cpap mask desensitization study in labs  - Advised he aim to wear CPAP every night for min 4-6 hours or longer   Dyspnea - Patient reports dyspnea and chest tightness with exertion > 1 year. No significant cough or wheezing. He has three cats and a dog at home and possible exposure to black mold.  - Needs CXR today and PFTs to further evaluated  - Sending in Albuterol rescue inhaler to use as needed q 6 hours for sob/wheezng  - FU in 4-8 weeks or sooner if needed/ may consider allergy panel       Martyn Ehrich, NP 10/21/2020

## 2020-09-11 ENCOUNTER — Ambulatory Visit
Admission: RE | Admit: 2020-09-11 | Discharge: 2020-09-11 | Disposition: A | Discharge status: Home / Self Care | Payer: BC Managed Care – PPO | Source: Ambulatory Visit | Attending: Primary Care | Admitting: Primary Care

## 2020-09-11 ENCOUNTER — Encounter: Payer: Self-pay | Admitting: Primary Care

## 2020-09-11 ENCOUNTER — Telehealth: Payer: Self-pay

## 2020-09-11 ENCOUNTER — Other Ambulatory Visit: Payer: Self-pay

## 2020-09-11 ENCOUNTER — Ambulatory Visit: Payer: BC Managed Care – PPO | Admitting: Primary Care

## 2020-09-11 VITALS — BP 124/68 | HR 97 | Temp 97.1°F | Ht 68.0 in | Wt 341.0 lb

## 2020-09-11 DIAGNOSIS — R0600 Dyspnea, unspecified: Secondary | ICD-10-CM

## 2020-09-11 DIAGNOSIS — R0602 Shortness of breath: Secondary | ICD-10-CM

## 2020-09-11 DIAGNOSIS — R0609 Other forms of dyspnea: Secondary | ICD-10-CM

## 2020-09-11 DIAGNOSIS — G4733 Obstructive sleep apnea (adult) (pediatric): Secondary | ICD-10-CM

## 2020-09-11 DIAGNOSIS — Z9989 Dependence on other enabling machines and devices: Secondary | ICD-10-CM

## 2020-09-11 MED ORDER — ALBUTEROL SULFATE HFA 108 (90 BASE) MCG/ACT IN AERS: 1.0000 | INHALATION_SPRAY | Freq: Four times a day (QID) | RESPIRATORY_TRACT | 1 refills | Status: AC | PRN

## 2020-09-11 NOTE — Patient Instructions (Addendum)
Recommendations - Look into getting CPAP pillow for side sleepers, if this does not work may need cpap mask desensitization study in labs  - Aim to wear CPAP every night for min 4-6 hours or longer - Use albuterol rescue inhaler 2 puffs every 6 hours for shortness of breath/wheezing  Orders: - Adjust CPAP 10-20cm h20  - CXR today  and PFTs  re: dyspnea   Rx: - Albuterol rescue inhaler   Follow-up: - 4-8 weeks after PFTs

## 2020-09-11 NOTE — Telephone Encounter (Signed)
Called and spoke with patient regarding his machine on 09/10/20.

## 2020-09-17 ENCOUNTER — Telehealth: Payer: Self-pay

## 2020-09-17 NOTE — Telephone Encounter (Signed)
Called and spoke with pt about upcoming Covid test on 5/4. Pt had a clear understanding.

## 2020-09-18 ENCOUNTER — Other Ambulatory Visit: Payer: Self-pay

## 2020-09-18 ENCOUNTER — Other Ambulatory Visit
Admission: RE | Admit: 2020-09-18 | Discharge: 2020-09-18 | Disposition: A | Discharge status: Home / Self Care | Payer: BC Managed Care – PPO | Source: Ambulatory Visit | Attending: Primary Care | Admitting: Primary Care

## 2020-09-18 DIAGNOSIS — Z20822 Contact with and (suspected) exposure to covid-19: Secondary | ICD-10-CM | POA: Diagnosis not present

## 2020-09-18 DIAGNOSIS — Z01812 Encounter for preprocedural laboratory examination: Secondary | ICD-10-CM | POA: Insufficient documentation

## 2020-09-18 LAB — SARS CORONAVIRUS 2 (TAT 6-24 HRS): SARS Coronavirus 2: NEGATIVE

## 2020-09-19 ENCOUNTER — Ambulatory Visit: Payer: BC Managed Care – PPO | Attending: Primary Care

## 2020-09-19 DIAGNOSIS — R0602 Shortness of breath: Secondary | ICD-10-CM | POA: Diagnosis not present

## 2020-10-21 DIAGNOSIS — R0600 Dyspnea, unspecified: Secondary | ICD-10-CM | POA: Insufficient documentation

## 2020-10-21 NOTE — Assessment & Plan Note (Signed)
-   Stopped wearing CPAP several months ago d.t mask issues  - Plan adjust CPAP pressure to 10-20cm h20 and recommend he look into getting CPAP pillow for side sleepers, if this does not work may need cpap mask desensitization study in labs  - Advised he aim to wear CPAP every night for min 4-6 hours or longer

## 2020-10-21 NOTE — Assessment & Plan Note (Addendum)
-   Patient reports dyspnea and chest tightness with exertion > 1 year. No significant cough or wheezing. He has three cats and a dog at home and possible exposure to black mold.  - Needs CXR today and PFTs to further evaluated  - Sending in Albuterol rescue inhaler to use as needed q 6 hours for sob/wheezng  - FU in 4-8 weeks or sooner if needed/ may consider allergy panel

## 2020-10-25 NOTE — Progress Notes (Signed)
Please let patient know PFTs looked normal, no evidence of COPD or asthma. Follow up with either Dr. Mortimer Fries or Patsey Berthold if needed

## 2020-12-31 ENCOUNTER — Ambulatory Visit (INDEPENDENT_AMBULATORY_CARE_PROVIDER_SITE_OTHER): Payer: BC Managed Care – PPO | Admitting: Cardiovascular Disease

## 2020-12-31 ENCOUNTER — Encounter: Payer: Self-pay | Admitting: Cardiovascular Disease

## 2020-12-31 ENCOUNTER — Other Ambulatory Visit: Payer: Self-pay

## 2020-12-31 VITALS — BP 110/70 | HR 94 | Ht 68.0 in | Wt 326.4 lb

## 2020-12-31 DIAGNOSIS — E785 Hyperlipidemia, unspecified: Secondary | ICD-10-CM

## 2020-12-31 DIAGNOSIS — R0989 Other specified symptoms and signs involving the circulatory and respiratory systems: Secondary | ICD-10-CM

## 2020-12-31 DIAGNOSIS — I5022 Chronic systolic (congestive) heart failure: Secondary | ICD-10-CM | POA: Diagnosis not present

## 2020-12-31 DIAGNOSIS — G4730 Sleep apnea, unspecified: Secondary | ICD-10-CM

## 2020-12-31 DIAGNOSIS — I10 Essential (primary) hypertension: Secondary | ICD-10-CM

## 2020-12-31 MED ORDER — ENALAPRIL MALEATE 20 MG PO TABS: 20.0000 mg | ORAL_TABLET | Freq: Two times a day (BID) | ORAL | 5 refills | Status: DC

## 2020-12-31 NOTE — Patient Instructions (Signed)
Medication Instructions:  Your physician has recommended you make the following change in your medication:   DECREASE Enalapril to 31m twice a day.  *If you need a refill on your cardiac medications before your next appointment, please call your pharmacy*   Lab Work: None ordered If you have labs (blood work) drawn today and your tests are completely normal, you will receive your results only by: [place](if you have MyChart) OR A paper copy in the mail If you have any lab test that is abnormal or we need to change your treatment, we will call you to review the results.   Testing/Procedures: None ordered   Follow-Up: [facility] and your [facility] needs are our [facility].  As part of our continuing mission to provide you with exceptional heart care, we have created designated Provider Care Teams.  These Care Teams include your primary Cardiologist (physician) and Advanced Practice Providers (APPs -  Physician Assistants and Nurse Practitioners) who all work together to provide you with the care you need, when you need it.  We recommend signing up for the patient portal called "MyChart".  Sign up information is provided on this After Visit Summary.  MyChart is used to connect with patients for Virtual Visits (Telemedicine).  Patients are able to view lab/test results, encounter notes, upcoming appointments, etc.  Non-urgent messages can be sent to your provider as well.   To learn more about what you can do with MyChart, go to [email or web address]    Your next appointment:   6 month(s)  The format for your next appointment:   In Person  Provider:   You may see MKathlyn Sacramento MD or one of the following Advanced Practice Providers on your designated Care Team:   CMurray Hodgkins NP RChristell Faith PA-C JMarrianne Mood PA-C Cadence FKathlen Mody [place]  Other Instructions N/A

## 2020-12-31 NOTE — Progress Notes (Signed)
Cardiology Office Note   Date:  12/31/2020   ID:  [name], DOB 1965-10-07, MRN [id number]  PCP:  [name], MD  Cardiologist:   [name], MD   Chief Complaint  Patient presents with   Other    6 month f/u c/o neck pain, chest tightness/dizziness and leg weakness. Meds reviewed verbally with pt.     History of Present Illness: Brett Marshall is a 55 y.o. male who is here today for a follow-up visit regarding chronic systolic heart failure due to nonischemic cardiomyopathy.   The patient has multiple chronic medical conditions that include type 2 diabetes, essential hypertension, morbid obesity, obstructive sleep apnea on CPAP and degenerative disc disease.   Right and left cardiac catheterization done in April of 2019 showed mild nonobstructive coronary artery disease, mildly reduced LV systolic function with an EF of 45 to 50% with global hypokinesis.  Right heart catheterization showed moderately elevated filling pressures with a wedge pressure of 22 mmHg, mild pulmonary hypertension and high cardiac output.    He had a repeat echocardiogram done in September 2021 which showed an EF of 60 to 65% with grade 1 diastolic dysfunction.  He has been doing reasonably well no recent chest pain or worsening dyspnea.  He has been mostly bothered by orthostatic dizziness recently.  He takes furosemide only as needed and and on average less than 1 time per week.  He lost 15 pounds since last visit as he has been trying to be more careful about diet and controlling portions.  Past Medical History:  Diagnosis Date   Asthma    CAD (coronary artery disease)    a. 08/2017 Cath: LM nl, lAD 30ost, 20m 30d, RI min irregs, LCX min irregs, RCA small, 40p, EF 45-50%, diff HK.   DDD (degenerative disc disease), lumbar    Depression    Diabetes mellitus without complication ([place]    Essential hypertension, benign    HFrEF (heart failure with reduced ejection fraction) ([place]    a.  08/2017 LV gram: EF 45-50%, glob HK.   Leg swelling    Left   Low back pain    Morbid obesity (HCC)    NICM (nonischemic cardiomyopathy) ([facility]    a. 08/2017 LV gram: EF 45-50%, global HK.   OSA on CPAP    non compliant   Pulmonary hypertension ([facility]     Past Surgical History:  Procedure Laterality Date   CARDIAC CATHETERIZATION     [place] no stents.   COLONOSCOPY WITH PROPOFOL N/A 04/11/2019   Procedure: COLONOSCOPY WITH PROPOFOL;  Surgeon: WLucilla Lame MD;  [facility];  Service: Endoscopy;  Laterality: N/A;   RIGHT/LEFT HEART CATH AND CORONARY ANGIOGRAPHY N/A 09/02/2017   Procedure: RIGHT/LEFT HEART CATH AND CORONARY ANGIOGRAPHY;  Surgeon: AWellington Hampshire MD;  [facility];  Service: Cardiovascular;  Laterality: N/A;     Current Outpatient Medications  Medication Sig Dispense Refill   albuterol (VENTOLIN HFA) 108 (90 Base) MCG/ACT inhaler Inhale 1-2 puffs into the lungs every 6 (six) hours as needed for wheezing or shortness of breath. 18 g 1   atorvastatin (LIPITOR) 10 MG tablet Take 10 mg by mouth daily at 6 PM.      carvedilol (COREG) 6.25 MG tablet Take 1 tablet (6.25 mg total) by mouth 2 (two) times daily. 60 tablet 5   cyclobenzaprine (FLEXERIL) 10 MG tablet Take 1 tablet (10 mg total) by mouth 3 (three)  times daily as needed.For spasms 90 tablet 5   DULoxetine (CYMBALTA) 30 MG capsule Take 1 capsule (30 mg total) by mouth daily. (Patient taking differently: Take 60 mg by mouth daily.) 90 capsule 1   famotidine (PEPCID) 40 MG tablet Take 40 mg by mouth daily.      furosemide (LASIX) 20 MG tablet Take 1 tablet (20 mg total) by mouth daily as needed (for SOB or weight gain of 3 lbs overnight). 90 tablet 1   glipiZIDE (GLUCOTROL) 5 MG tablet Take 1 tablet (5 mg total) by mouth 2 (two) times daily before a meal. 180 tablet 1   glucose blood (GE100 BLOOD GLUCOSE TEST) test strip Use as instructed 100 each 12   HYDROcodone-acetaminophen (NORCO)  5-325 MG tablet Take 1 tablet by mouth every 6 (six) hours as needed for moderate pain. 15 tablet 0   JARDIANCE 25 MG TABS tablet Take 25 mg by mouth daily.     Lancets (ONETOUCH ULTRASOFT) lancets Check sugar twice daily 100 each 12   metFORMIN (GLUCOPHAGE) 1000 MG tablet Take 1 tablet (1,000 mg total) by mouth 2 (two) times daily with a meal. 180 tablet 1   enalapril (VASOTEC) 20 MG tablet Take 1 tablet (20 mg total) by mouth 2 (two) times daily. 60 tablet 5   No current facility-administered medications for this visit.    Allergies:   Patient has no known allergies.    Social History:  The patient  reports that he has never smoked. He has never used smokeless tobacco. He reports current alcohol use. He reports that he does not use drugs.   Family History:  The patient's family history includes Asthma in his mother; Diabetes in his mother; Heart attack in his father; Heart disease in his father; Heart disease (age of onset: 71) in his brother; Sjogren's syndrome in his sister.    ROS:  Please see the history of present illness.   Otherwise, review of systems are positive for none.   All other systems are reviewed and negative.   PHYSICAL EXAM: VS:  BP 110/70 (BP Location: Left Arm, Patient Position: Sitting, Cuff Size: Large)   Pulse 94   Ht 5' 8"  (1.727 m)   Wt (!) 326 lb 6 oz (148 kg)   SpO2 98%   BMI 49.63 kg/m  , BMI Body mass index is 49.63 kg/m. GEN: Well nourished, well developed, in no acute distress  HEENT: normal  Neck: no JVD or masses.  Bilateral carotid bruits. Cardiac: RRR; norubs, or gallops.  1/6 systolic murmur at the aortic area.  Mild bilateral leg edema. Respiratory:  clear to auscultation bilaterally, normal work of breathing GI: soft, nontender, nondistended, + BS MS: no deformity or atrophy  Skin: warm and dry, no rash Neuro:  Strength and sensation are intact Psych: euthymic mood, full affect   EKG:  EKG is ordered today. The ekg ordered today  demonstrates normal sinus rhythm with low voltage.   Recent Labs: No results found for requested labs within last 8760 hours.    Lipid Panel    Component Value Date/Time   CHOL 176 12/08/2017 1150   TRIG 143 12/08/2017 1150   HDL 44 12/08/2017 1150   CHOLHDL 4.0 12/08/2017 1150   CHOLHDL 3.5 01/11/2016 1008   VLDL 17 01/11/2016 1008   LDLCALC 103 (H) 12/08/2017 1150      Wt Readings from Last 3 Encounters:  12/31/20 (!) 326 lb 6 oz (148 kg)  09/11/20 (!) 341 lb (  154.7 kg)  08/05/20 (!) 344 lb (156 kg)     PAD Screen 08/27/2017  Previous PAD dx? No  Previous surgical procedure? No  Pain with walking? Yes  Subsides with rest? No  Feet/toe relief with dangling? Yes  Painful, non-healing ulcers? No  Extremities discolored? Yes      ASSESSMENT AND PLAN:  1.  Chronic systolic heart failure: Most recent echocardiogram in September 2021 showed improvement in ejection fraction to normal.  He appears to be euvolemic and has not required furosemide on a regular basis.  Continue treatment with carvedilol and enalapril.  2.  Bilateral carotid bruits: Carotid Doppler showed normal carotid arteries.   3.  Essential hypertension: His blood pressure is on the low side and he has been having significant orthostatic dizziness.  Thus, I elected to decrease enalapril to 10 mg twice daily.  Continue same dose of carvedilol.   4.  Mild hyperlipidemia: Currently on atorvastatin 10 mg daily given that he is diabetic.  Recommended target LDL of less than 70.  5.  Sleep apnea: He has not been able to use his CPAP machine due to difficulty keeping it on at night.  6.  Morbid obesity: Improved his diet and has been able to lose 15 pounds since last visit.    Disposition:   FU with me in 6 month  Signed,  [name], MD  12/31/2020 2:54 PM    [facility]

## 2021-01-01 ENCOUNTER — Telehealth: Payer: Self-pay | Admitting: Cardiovascular Disease

## 2021-01-01 DIAGNOSIS — I10 Essential (primary) hypertension: Secondary | ICD-10-CM

## 2021-01-01 MED ORDER — ENALAPRIL MALEATE 10 MG PO TABS: 10.0000 mg | ORAL_TABLET | Freq: Two times a day (BID) | ORAL | 2 refills | Status: DC

## 2021-01-01 NOTE — Telephone Encounter (Signed)
Pt c/o medication issue:  1. Name of Medication: enalapril   2. How are you currently taking this medication (dosage and times per day)? 20 mg po BID   3. Are you having a reaction (difficulty breathing--STAT)? no  4. What is your medication issue? Patient discussed during visit to decrease dose to 10 mg po BID but rx sent for 20 and note says 20 not resulting in a change or decrease for patient.   Please call to clarify dose

## 2021-01-01 NOTE — Telephone Encounter (Signed)
Spoke with the patient. Adv the patient that they may have been a miscommunication between Dr. Fletcher Anon and I. Apologized for any inconvenience.  Review of Dr. Tyrell Antonio note confirms reducing enalapril to 10 mg bid. Patient did not pick up the 20 mg Rx. Updated Rx for enalapril 10 mg bid sent to the patients pharmacy.  Patient verbalized understanding. I thanked the patient for his f/u call.

## 2021-01-22 ENCOUNTER — Other Ambulatory Visit: Payer: Self-pay | Admitting: Neurology

## 2021-01-22 DIAGNOSIS — R519 Headache, unspecified: Secondary | ICD-10-CM

## 2021-02-04 ENCOUNTER — Other Ambulatory Visit: Payer: Self-pay

## 2021-02-04 ENCOUNTER — Ambulatory Visit
Admission: RE | Admit: 2021-02-04 | Discharge: 2021-02-04 | Disposition: A | Discharge status: Home / Self Care | Payer: BC Managed Care – PPO | Source: Ambulatory Visit | Attending: Neurology | Admitting: Neurology

## 2021-02-04 DIAGNOSIS — R519 Headache, unspecified: Secondary | ICD-10-CM | POA: Diagnosis present

## 2021-04-18 DIAGNOSIS — R42 Dizziness and giddiness: Secondary | ICD-10-CM | POA: Diagnosis not present

## 2021-04-18 DIAGNOSIS — R519 Headache, unspecified: Secondary | ICD-10-CM | POA: Diagnosis not present

## 2021-04-18 DIAGNOSIS — G4733 Obstructive sleep apnea (adult) (pediatric): Secondary | ICD-10-CM | POA: Diagnosis not present

## 2021-04-18 DIAGNOSIS — M5481 Occipital neuralgia: Secondary | ICD-10-CM | POA: Diagnosis not present

## 2021-07-10 ENCOUNTER — Encounter: Payer: Self-pay | Admitting: Cardiovascular Disease

## 2021-07-10 ENCOUNTER — Other Ambulatory Visit: Payer: Self-pay

## 2021-07-10 ENCOUNTER — Ambulatory Visit: Payer: BLUE CROSS/BLUE SHIELD | Admitting: Cardiovascular Disease

## 2021-07-10 VITALS — BP 158/100 | HR 93 | Ht 68.0 in | Wt 328.1 lb

## 2021-07-10 DIAGNOSIS — G4730 Sleep apnea, unspecified: Secondary | ICD-10-CM | POA: Diagnosis not present

## 2021-07-10 DIAGNOSIS — E785 Hyperlipidemia, unspecified: Secondary | ICD-10-CM | POA: Diagnosis not present

## 2021-07-10 DIAGNOSIS — I5022 Chronic systolic (congestive) heart failure: Secondary | ICD-10-CM

## 2021-07-10 DIAGNOSIS — I10 Essential (primary) hypertension: Secondary | ICD-10-CM

## 2021-07-10 MED ORDER — CARVEDILOL 12.5 MG PO TABS: 12.5000 mg | ORAL_TABLET | Freq: Two times a day (BID) | ORAL | 3 refills | Status: DC

## 2021-07-10 NOTE — Patient Instructions (Signed)
Medication Instructions:  Your physician has recommended you make the following change in your medication:   INCREASE Carvedilol to 12.5 mg twice a day. An Rx has been sent to your pharmacy.  *If you need a refill on your cardiac medications before your next appointment, please call your pharmacy*   Lab Work: None ordered If you have labs (blood work) drawn today and your tests are completely normal, you will receive your results only by: [place] (if you have MyChart) OR A paper copy in the mail If you have any lab test that is abnormal or we need to change your treatment, we will call you to review the results.   Testing/Procedures: None ordered   Follow-Up: At [facility], you and your health needs are our priority.  As part of our continuing mission to provide you with exceptional heart care, we have created designated Provider Care Teams.  These Care Teams include your primary Cardiologist (physician) and Advanced Practice Providers (APPs -  Physician Assistants and Nurse Practitioners) who all work together to provide you with the care you need, when you need it.  We recommend signing up for the patient portal called "MyChart".  Sign up information is provided on this After Visit Summary.  MyChart is used to connect with patients for Virtual Visits (Telemedicine).  Patients are able to view lab/test results, encounter notes, upcoming appointments, etc.  Non-urgent messages can be sent to your provider as well.   To learn more about what you can do with MyChart, go to [email or web address].    Your next appointment:   Your physician wants you to follow-up in: 1 year You will receive a reminder letter in the mail two months in advance. If you don't receive a letter, please call our office to schedule the follow-up appointment.   The format for your next appointment:   In Person  Provider:   You may see [name], MD or one of the following Advanced Practice  Providers on your designated Care Team:   Murray Hodgkins, NP Christell Faith, PA-C Cadence Kathlen Mody, [place]   Other Instructions N/A

## 2021-07-10 NOTE — Progress Notes (Signed)
Cardiology Office Note   Date:  07/10/2021   ID:  Brett Marshall, Brett Marshall [birth year], MRN [id number]  PCP:  [name], MD  Cardiologist:   [name], MD   Chief Complaint  Patient presents with   Other    6 Month f/u c/o arms being cold. Meds reviewed verbally with pt.     History of Present Illness: Brett Marshall is a 56 y.o. male who is here today for a follow-up visit regarding chronic systolic heart failure due to nonischemic cardiomyopathy.   The patient has multiple chronic medical conditions that include type 2 diabetes, essential hypertension, morbid obesity, obstructive sleep apnea on CPAP and degenerative disc disease.   Right and left cardiac catheterization done in April of 2019 showed mild nonobstructive coronary artery disease, mildly reduced LV systolic function with an EF of 45 to 50% with global hypokinesis.  Right heart catheterization showed moderately elevated filling pressures with a wedge pressure of 22 mmHg, mild pulmonary hypertension and high cardiac output.    He had a repeat echocardiogram done in September 2021 which showed an EF of 60 to 65% with grade 1 diastolic dysfunction.  During last visit, he was having symptoms of orthostatic dizziness with relatively low blood pressure.  Thus, I decreased the dose of enalapril.  His blood pressure is noted to be elevated today.  He denies any chest pain or worsening dyspnea.  No significant leg edema.  Past Medical History:  Diagnosis Date   Asthma    CAD (coronary artery disease)    a. 08/2017 Cath: LM nl, lAD 30ost, 12m 30d, RI min irregs, LCX min irregs, RCA small, 40p, EF 45-50%, diff HK.   DDD (degenerative disc disease), lumbar    Depression    Diabetes mellitus without complication ([place]    Essential hypertension, benign    [place]FrEF (heart failure with reduced ejection fraction) ([place]    a. 08/2017 LV gram: EF 45-50%, glob HK.   Leg swelling    Left   Low back pain    Morbid obesity (HCC)     NICM (nonischemic cardiomyopathy) ([place]    a. 08/2017 LV gram: EF 45-50%, global HK.   OSA on CPAP    non compliant   Pulmonary hypertension ([place]     Past Surgical History:  Procedure Laterality Date   CARDIAC CATHETERIZATION     [place] no stents.   COLONOSCOPY WITH PROPOFOL N/A 04/11/2019   Procedure: COLONOSCOPY WITH PROPOFOL;  Surgeon: WLucilla Lame MD;  [facility];  Service: Endoscopy;  Laterality: N/A;   RIGHT/LEFT HEART CATH AND CORONARY ANGIOGRAPHY N/A 09/02/2017   Procedure: RIGHT/LEFT HEART CATH AND CORONARY ANGIOGRAPHY;  Surgeon: AWellington Hampshire MD;  [facility];  Service: Cardiovascular;  Laterality: N/A;     Current Outpatient Medications  Medication Sig Dispense Refill   albuterol (VENTOLIN HFA) 108 (90 Base) MCG/ACT inhaler Inhale 1-2 puffs into the lungs every 6 (six) hours as needed for wheezing or shortness of breath. 18 g 1   atorvastatin (LIPITOR) 10 MG tablet Take 10 mg by mouth daily at 6 PM.      carvedilol (COREG) 6.25 MG tablet Take 1 tablet (6.25 mg total) by mouth 2 (two) times daily. 60 tablet 5   cyclobenzaprine (FLEXERIL) 10 MG tablet Take 1 tablet (10 mg total) by mouth 3 (three) times daily as needed.For spasms 90 tablet 5   DULoxetine (CYMBALTA) 30 MG capsule Take 1 capsule (30  mg total) by mouth daily. (Patient taking differently: Take 60 mg by mouth daily.) 90 capsule 1   enalapril (VASOTEC) 10 MG tablet Take 1 tablet (10 mg total) by mouth 2 (two) times daily. 180 tablet 2   famotidine (PEPCID) 40 MG tablet Take 40 mg by mouth daily.      furosemide (LASIX) 20 MG tablet Take 1 tablet (20 mg total) by mouth daily as needed (for SOB or weight gain of 3 lbs overnight). 90 tablet 1   glipiZIDE (GLUCOTROL) 5 MG tablet Take 1 tablet (5 mg total) by mouth 2 (two) times daily before a meal. 180 tablet 1   glucose blood (GE100 BLOOD GLUCOSE TEST) test strip Use as instructed 100 each 12   JARDIANCE 25 MG TABS tablet  Take 25 mg by mouth daily.     Lancets (ONETOUCH ULTRASOFT) lancets Check sugar twice daily 100 each 12   metFORMIN (GLUCOPHAGE) 1000 MG tablet Take 1 tablet (1,000 mg total) by mouth 2 (two) times daily with a meal. 180 tablet 1   No current facility-administered medications for this visit.    Allergies:   Patient has no known allergies.    Social History:  The patient  reports that he has never smoked. He has never used smokeless tobacco. He reports current alcohol use. He reports that he does not use drugs.   Family History:  The patient's family history includes Asthma in his mother; Diabetes in his mother; Heart attack in his father; Heart disease in his father; Heart disease (age of onset: 45) in his brother; Sjogren's syndrome in his sister.    ROS:  Please see the history of present illness.   Otherwise, review of systems are positive for none.   All other systems are reviewed and negative.   PHYSICAL EXAM: VS:  BP (!) 158/100 (BP Location: Right Arm, Patient Position: Sitting, Cuff Size: Normal)    Pulse 93    Ht 5' 8"  (1.727 m)    Wt (!) 328 lb 2 oz (148.8 kg)    SpO2 98%    BMI 49.89 kg/m  , BMI Body mass index is 49.89 kg/m. GEN: Well nourished, well developed, in no acute distress  HEENT: normal  Neck: no JVD or masses.  No carotid bruits. Cardiac: RRR; norubs, or gallops.  1/6 systolic murmur at the aortic area.  Mild bilateral leg edema. Respiratory:  clear to auscultation bilaterally, normal work of breathing GI: soft, nontender, nondistended, + BS MS: no deformity or atrophy  Skin: warm and dry, no rash Neuro:  Strength and sensation are intact Psych: euthymic mood, full affect   EKG:  EKG is ordered today. The ekg ordered today demonstrates normal sinus rhythm with low voltage.   Recent Labs: No results found for requested labs within last 8760 hours.    Lipid Panel    Component Value Date/Time   CHOL 176 12/08/2017 1150   TRIG 143 12/08/2017 1150   HDL  44 12/08/2017 1150   CHOLHDL 4.0 12/08/2017 1150   CHOLHDL 3.5 01/11/2016 1008   VLDL 17 01/11/2016 1008   LDLCALC 103 (H) 12/08/2017 1150      Wt Readings from Last 3 Encounters:  07/10/21 (!) 328 lb 2 oz (148.8 kg)  12/31/20 (!) 326 lb 6 oz (148 kg)  09/11/20 (!) 341 lb (154.7 kg)     PAD Screen 08/27/2017  Previous PAD dx? No  Previous surgical procedure? No  Pain with walking? Yes  Subsides with rest?  No  Feet/toe relief with dangling? Yes  Painful, non-healing ulcers? No  Extremities discolored? Yes      ASSESSMENT AND PLAN:  1.  Chronic systolic heart failure: Most recent echocardiogram in September 2021 showed improvement in ejection fraction to normal.  He appears to be euvolemic and has not required furosemide on a regular basis.  Continue treatment with carvedilol and enalapril.  2.  Essential hypertension: His blood pressure was low during last visit and enalapril was decreased.  However, his blood pressure was elevated today.  I elected to increase carvedilol to 12.5 mg twice daily.   3.  Mild hyperlipidemia: Currently on atorvastatin 10 mg daily given that he is diabetic.  Recommended target LDL of less than 70.  He will be getting a physical in May.  4.  Sleep apnea: He has not been able to use his CPAP machine due to difficulty keeping it on at night.  5.  Morbid obesity: He was able to lose some weight last year but has plateaued since then.    Disposition:   FU with me in 12 month  Signed,  [name], MD  07/10/2021 8:42 AM    [place]

## 2021-07-11 ENCOUNTER — Other Ambulatory Visit: Payer: Self-pay | Admitting: Cardiovascular Disease

## 2021-09-04 ENCOUNTER — Encounter: Payer: Self-pay | Admitting: Emergency Medicine

## 2021-09-04 ENCOUNTER — Other Ambulatory Visit: Payer: Self-pay

## 2021-09-04 ENCOUNTER — Emergency Department: Payer: BLUE CROSS/BLUE SHIELD

## 2021-09-04 ENCOUNTER — Inpatient Hospital Stay
Admission: EM | Admit: 2021-09-04 | Discharge: 2021-09-09 | DRG: 247 | Disposition: A | Discharge status: Home / Self Care | Payer: BLUE CROSS/BLUE SHIELD | Attending: Internal Medicine | Admitting: Internal Medicine

## 2021-09-04 DIAGNOSIS — I10 Essential (primary) hypertension: Secondary | ICD-10-CM | POA: Diagnosis present

## 2021-09-04 DIAGNOSIS — Z825 Family history of asthma and other chronic lower respiratory diseases: Secondary | ICD-10-CM | POA: Diagnosis not present

## 2021-09-04 DIAGNOSIS — I2510 Atherosclerotic heart disease of native coronary artery without angina pectoris: Secondary | ICD-10-CM | POA: Diagnosis not present

## 2021-09-04 DIAGNOSIS — I200 Unstable angina: Secondary | ICD-10-CM

## 2021-09-04 DIAGNOSIS — R233 Spontaneous ecchymoses: Secondary | ICD-10-CM | POA: Diagnosis not present

## 2021-09-04 DIAGNOSIS — E119 Type 2 diabetes mellitus without complications: Secondary | ICD-10-CM | POA: Diagnosis present

## 2021-09-04 DIAGNOSIS — S40021A Contusion of right upper arm, initial encounter: Secondary | ICD-10-CM | POA: Diagnosis not present

## 2021-09-04 DIAGNOSIS — Z8249 Family history of ischemic heart disease and other diseases of the circulatory system: Secondary | ICD-10-CM

## 2021-09-04 DIAGNOSIS — I259 Chronic ischemic heart disease, unspecified: Secondary | ICD-10-CM | POA: Diagnosis not present

## 2021-09-04 DIAGNOSIS — F32A Depression, unspecified: Secondary | ICD-10-CM | POA: Diagnosis not present

## 2021-09-04 DIAGNOSIS — I25110 Atherosclerotic heart disease of native coronary artery with unstable angina pectoris: Principal | ICD-10-CM | POA: Diagnosis present

## 2021-09-04 DIAGNOSIS — Z91199 Patient's noncompliance with other medical treatment and regimen due to unspecified reason: Secondary | ICD-10-CM

## 2021-09-04 DIAGNOSIS — Z6841 Body Mass Index (BMI) 40.0 and over, adult: Secondary | ICD-10-CM | POA: Diagnosis not present

## 2021-09-04 DIAGNOSIS — R079 Chest pain, unspecified: Secondary | ICD-10-CM | POA: Diagnosis not present

## 2021-09-04 DIAGNOSIS — Z833 Family history of diabetes mellitus: Secondary | ICD-10-CM | POA: Diagnosis not present

## 2021-09-04 DIAGNOSIS — G4733 Obstructive sleep apnea (adult) (pediatric): Secondary | ICD-10-CM | POA: Diagnosis present

## 2021-09-04 DIAGNOSIS — I272 Pulmonary hypertension, unspecified: Secondary | ICD-10-CM | POA: Diagnosis present

## 2021-09-04 DIAGNOSIS — I110 Hypertensive heart disease with heart failure: Secondary | ICD-10-CM | POA: Diagnosis not present

## 2021-09-04 DIAGNOSIS — I5022 Chronic systolic (congestive) heart failure: Secondary | ICD-10-CM | POA: Diagnosis present

## 2021-09-04 DIAGNOSIS — Z79899 Other long term (current) drug therapy: Secondary | ICD-10-CM

## 2021-09-04 DIAGNOSIS — E785 Hyperlipidemia, unspecified: Secondary | ICD-10-CM | POA: Diagnosis present

## 2021-09-04 DIAGNOSIS — R0789 Other chest pain: Secondary | ICD-10-CM | POA: Diagnosis present

## 2021-09-04 DIAGNOSIS — M7981 Nontraumatic hematoma of soft tissue: Secondary | ICD-10-CM | POA: Diagnosis not present

## 2021-09-04 DIAGNOSIS — I428 Other cardiomyopathies: Secondary | ICD-10-CM | POA: Diagnosis present

## 2021-09-04 DIAGNOSIS — J45909 Unspecified asthma, uncomplicated: Secondary | ICD-10-CM | POA: Diagnosis not present

## 2021-09-04 DIAGNOSIS — R072 Precordial pain: Secondary | ICD-10-CM | POA: Diagnosis not present

## 2021-09-04 DIAGNOSIS — R9439 Abnormal result of other cardiovascular function study: Secondary | ICD-10-CM | POA: Diagnosis not present

## 2021-09-04 DIAGNOSIS — Z7984 Long term (current) use of oral hypoglycemic drugs: Secondary | ICD-10-CM

## 2021-09-04 DIAGNOSIS — E782 Mixed hyperlipidemia: Secondary | ICD-10-CM | POA: Diagnosis not present

## 2021-09-04 DIAGNOSIS — M7989 Other specified soft tissue disorders: Secondary | ICD-10-CM | POA: Diagnosis not present

## 2021-09-04 LAB — CBG MONITORING, ED: Glucose-Capillary: 142 mg/dL — ABNORMAL HIGH (ref 70–99)

## 2021-09-04 MED ORDER — KETOROLAC TROMETHAMINE 30 MG/ML IJ SOLN
30.0000 mg | Freq: Once | INTRAMUSCULAR | Status: AC
  Administered 2021-09-04: 30 mg via INTRAVENOUS
  Filled 2021-09-04: qty 1

## 2021-09-04 NOTE — ED Triage Notes (Signed)
Pt to ED from home c/o central chest pain for a couple days that is sharp, SOB, dizzy, denies n/v/d.  Seen by Dr. Sophronia Simas with cath done 2 years ago.  States was walking around and driving when pain started.  Pt A&Ox4, chest rise even and unlabored, skin WNL and in NAD at this time. ?

## 2021-09-04 NOTE — ED Provider Notes (Signed)
? ?[facility] ?Provider Note ? ? ? Event Date/Time  ? First MD Initiated Contact with Patient 09/04/21 2330   ?  (approximate) ? ? ?History  ? ?Chest Pain ? ? ?HPI ? ?Brett Marshall is a 56 y.o. male with history of obesity, hypertension, diabetes, pulmonary hypertension, nonischemic cardiomyopathy, nonobstructive coronary artery disease on cardiac catheterization in 2019 who presents to the emergency department with complaints of 1 week of intermittent chest pain.  Describes as a left-sided pressure that is worse with walking, lying flat.  He states initially it just came on with walking and resolved at rest but now it is even worse with walking but still present at rest.  Has had some shortness of breath.  No nausea, vomiting, diaphoresis or dizziness.  His cardiologist is Dr. Fletcher Anon.  States he has had a nonproductive cough but no fever.  No lower extremity swelling or pain. ? ? ?History provided by patient and wife. ? ? ? ?Past Medical History:  ?Diagnosis Date  ? Asthma   ? CAD (coronary artery disease)   ? a. 08/2017 Cath: LM nl, lAD 30ost, 77m 30d, RI min irregs, LCX min irregs, RCA small, 40p, EF 45-50%, diff HK.  ? DDD (degenerative disc disease), lumbar   ? Depression   ? Diabetes mellitus without complication ([place]   ? Essential hypertension, benign   ? HFrEF (heart failure with reduced ejection fraction) ([place]   ? a. 08/2017 LV gram: EF 45-50%, glob HK.  ? Leg swelling   ? Left  ? Low back pain   ? Morbid obesity ([place]   ? NICM (nonischemic cardiomyopathy) ([place]   ? a. 08/2017 LV gram: EF 45-50%, global HK.  ? OSA on CPAP   ? non compliant  ? Pulmonary hypertension ([place]   ? ? ?Past Surgical History:  ?Procedure Laterality Date  ? CARDIAC CATHETERIZATION    ? [place] no stents.  ? COLONOSCOPY WITH PROPOFOL N/A 04/11/2019  ? Procedure: COLONOSCOPY WITH PROPOFOL;  Surgeon: WLucilla Lame MD;  [facility];  Service: Endoscopy;  Laterality: N/A;  ? RIGHT/LEFT HEART CATH  AND CORONARY ANGIOGRAPHY N/A 09/02/2017  ? Procedure: RIGHT/LEFT HEART CATH AND CORONARY ANGIOGRAPHY;  Surgeon: AWellington Hampshire MD;  [facility];  Service: Cardiovascular;  Laterality: N/A;  ? ? ?MEDICATIONS:  ?Prior to Admission medications   ?Medication Sig Start Date End Date Taking? Authorizing Provider  ?albuterol (VENTOLIN HFA) 108 (90 Base) MCG/ACT inhaler Inhale 1-2 puffs into the lungs every 6 (six) hours as needed for wheezing or shortness of breath. 09/11/20   WMartyn Ehrich NP  ?atorvastatin (LIPITOR) 10 MG tablet Take 10 mg by mouth daily at 6 PM.  08/12/18 07/10/21  [provider]  ?carvedilol (COREG) 12.5 MG tablet Take 1 tablet (12.5 mg total) by mouth 2 (two) times daily. 07/10/21 07/10/22  AWellington Hampshire MD  ?cyclobenzaprine (FLEXERIL) 10 MG tablet Take 1 tablet (10 mg total) by mouth 3 (three) times daily as needed.For spasms 11/20/17   SWardell Honour MD  ?DULoxetine (CYMBALTA) 30 MG capsule Take 1 capsule (30 mg total) by mouth daily. ?Patient taking differently: Take 60 mg by mouth daily. 08/23/17   SWardell Honour MD  ?enalapril (VASOTEC) 10 MG tablet Take 1 tablet (10 mg total) by mouth 2 (two) times daily. 01/01/21   AWellington Hampshire MD  ?famotidine (PEPCID) 40 MG tablet Take 40 mg by mouth daily.  08/14/18   [provider]  ?furosemide (LASIX) 20 MG tablet Take 1 tablet (20 mg total) by mouth daily as needed (for SOB or weight gain of 3 lbs overnight). 07/14/21   Wellington Hampshire, MD  ?glipiZIDE (GLUCOTROL) 5 MG tablet Take 1 tablet (5 mg total) by mouth 2 (two) times daily before a meal. 12/09/17   [name], MD  ?glucose blood (GE100 BLOOD GLUCOSE TEST) test strip Use as instructed 02/03/16   [name], MD  ?JARDIANCE 25 MG TABS tablet Take 25 mg by mouth daily. 06/06/20   [provider]  ?Lancets [facility] ULTRASOFT) lancets Check sugar twice daily 01/11/16   [name], MD  ?metFORMIN (GLUCOPHAGE) 1000 MG tablet Take 1  tablet (1,000 mg total) by mouth 2 (two) times daily with a meal. 12/09/17   [name], MD  ? ? ?Physical Exam  ? ?Triage Vital Signs: ?ED Triage Vitals  ?Enc Vitals Group  ?   BP 09/04/21 2325 139/87  ?   Pulse Rate 09/04/21 2325 (!) 104  ?   Resp 09/04/21 2325 15  ?   Temp 09/04/21 2325 98.3 ?F (36.8 ?C)  ?   Temp Source 09/04/21 2325 Oral  ?   SpO2 09/04/21 2325 97 %  ?   Weight 09/04/21 2323 (!) 329 lb (149.2 kg)  ?   Height 09/04/21 2323 5' 8"  (1.727 m)  ?   Head Circumference --   ?   Peak Flow --   ?   Pain Score 09/04/21 2323 9  ?   Pain Loc --   ?   Pain Edu? --   ?   Excl. in [place]? --   ? ? ?Most recent vital signs: ?Vitals:  ? 09/05/21 0100 09/05/21 0117  ?BP: 120/64 107/65  ?Pulse:  91  ?Resp: 20 18  ?Temp:    ?SpO2:    ? ? ?CONSTITUTIONAL: Alert and oriented and responds appropriately to questions. Well-appearing; well-nourished, obese ?HEAD: Normocephalic, atraumatic ?EYES: Conjunctivae clear, pupils appear equal, sclera nonicteric ?ENT: normal nose; moist mucous membranes ?NECK: Supple, normal ROM ?CARD: RRR; S1 and S2 appreciated; no murmurs, no clicks, no rubs, no gallops ?CHEST:  Chest wall is tender to palpation which reproduces his pain.  No crepitus, ecchymosis, erythema, warmth, rash or other lesions present.   ?RESP: Normal chest excursion without splinting or tachypnea; breath sounds clear and equal bilaterally; no wheezes, no rhonchi, no rales, no hypoxia or respiratory distress, speaking full sentences ?ABD/GI: Normal bowel sounds; non-distended; soft, non-tender, no rebound, no guarding, no peritoneal signs ?BACK: The back appears normal ?EXT: Normal ROM in all joints; no deformity noted, has trace nonpitting edema in bilateral lower extremities, no calf tenderness ?SKIN: Normal color for age and race; warm; no rash on exposed skin ?NEURO: Moves all extremities equally, normal speech ?PSYCH: The patient's mood and manner are appropriate. ? ? ?ED Results / Procedures / Treatments   ? ?LABS: ?(all labs ordered are listed, but only abnormal results are displayed) ?Labs Reviewed  ?BASIC METABOLIC PANEL - Abnormal; Notable for the following components:  ?    Result Value  ? Glucose, Bld 151 (*)   ? BUN 21 (*)   ? All other components within normal limits  ?CBG MONITORING, ED - Abnormal; Notable for the following components:  ? Glucose-Capillary 142 (*)   ? All other components within normal limits  ?TROPONIN I (HIGH SENSITIVITY) - Abnormal; Notable for the following components:  ? Troponin I (High  Sensitivity) 25 (*)   ? All other components within normal limits  ?TROPONIN I (HIGH SENSITIVITY) - Abnormal; Notable for the following components:  ? Troponin I (High Sensitivity) 28 (*)   ? All other components within normal limits  ?CBC  ?D-DIMER, QUANTITATIVE  ?BRAIN NATRIURETIC PEPTIDE  ? ? ? ?EKG: ? EKG Interpretation ? ?Date/Time:  Thursday September 04 2021 23:19:05 EDT ?Ventricular Rate:  109 ?PR Interval:  174 ?QRS Duration: 76 ?QT Interval:  314 ?QTC Calculation: 422 ?R Axis:   49 ?Text Interpretation: Sinus tachycardia Low voltage QRS Cannot rule out Anterior infarct , age undetermined Abnormal ECG When compared with ECG of 29-Mar-2019 13:34, Minimal criteria for Anterior infarct are now Present Confirmed by Pryor Curia [id number]) on 09/04/2021 11:31:30 PM ?  ? ?  ? ? ? ?RADIOLOGY: ?My personal review and interpretation of imaging: Chest x-ray clear. ? ?I have personally reviewed all radiology reports.   ?DG Chest 2 View ? ?Result Date: 09/04/2021 ?CLINICAL DATA:  Chest pain. EXAM: CHEST - 2 VIEW COMPARISON:  Chest radiograph dated 09/11/2020. FINDINGS: The heart size and mediastinal contours are within normal limits. Both lungs are clear. The visualized skeletal structures are unremarkable. IMPRESSION: No active cardiopulmonary disease. Electronically Signed   By: Anner Crete M.D.   On: 09/04/2021 23:49   ? ? ?PROCEDURES: ? ?Critical Care performed: Yes, see critical care procedure  note(s) ? ? ?CRITICAL CARE ?Performed by: Cyril Mourning Azreal Stthomas ? ? ?Total critical care time: 45 minutes ? ?Critical care time was exclusive of separately billable procedures and treating other patients. ? ?Critical care was nece

## 2021-09-05 ENCOUNTER — Observation Stay (HOSPITAL_BASED_OUTPATIENT_CLINIC_OR_DEPARTMENT_OTHER)
Admit: 2021-09-05 | Discharge: 2021-09-05 | Disposition: A | Discharge status: Home / Self Care | Payer: BLUE CROSS/BLUE SHIELD | Attending: Physician Assistant | Admitting: Physician Assistant

## 2021-09-05 ENCOUNTER — Observation Stay (HOSPITAL_COMMUNITY): Payer: BLUE CROSS/BLUE SHIELD

## 2021-09-05 DIAGNOSIS — I200 Unstable angina: Secondary | ICD-10-CM

## 2021-09-05 DIAGNOSIS — I5022 Chronic systolic (congestive) heart failure: Secondary | ICD-10-CM

## 2021-09-05 DIAGNOSIS — E119 Type 2 diabetes mellitus without complications: Secondary | ICD-10-CM | POA: Diagnosis not present

## 2021-09-05 DIAGNOSIS — I259 Chronic ischemic heart disease, unspecified: Secondary | ICD-10-CM

## 2021-09-05 DIAGNOSIS — R079 Chest pain, unspecified: Secondary | ICD-10-CM

## 2021-09-05 DIAGNOSIS — I10 Essential (primary) hypertension: Secondary | ICD-10-CM | POA: Diagnosis not present

## 2021-09-05 DIAGNOSIS — E782 Mixed hyperlipidemia: Secondary | ICD-10-CM

## 2021-09-05 LAB — BASIC METABOLIC PANEL
Anion gap: 7 (ref 5–15)
Anion gap: 9 (ref 5–15)
BUN: 21 mg/dL — ABNORMAL HIGH (ref 6–20)
BUN: 21 mg/dL — ABNORMAL HIGH (ref 6–20)
CO2: 26 mmol/L (ref 22–32)
CO2: 28 mmol/L (ref 22–32)
Calcium: 8.6 mg/dL — ABNORMAL LOW (ref 8.9–10.3)
Calcium: 9 mg/dL (ref 8.9–10.3)
Chloride: 100 mmol/L (ref 98–111)
Chloride: 102 mmol/L (ref 98–111)
Creatinine, Ser: 1.06 mg/dL (ref 0.61–1.24)
Creatinine, Ser: 1.22 mg/dL (ref 0.61–1.24)
GFR, Estimated: >60 mL/min (ref >60)
GFR, Estimated: >60 mL/min (ref >60)
Glucose, Bld: 118 mg/dL — ABNORMAL HIGH (ref 70–99)
Glucose, Bld: 151 mg/dL — ABNORMAL HIGH (ref 70–99)
Potassium: 4.1 mmol/L (ref 3.5–5.1)
Potassium: 4.4 mmol/L (ref 3.5–5.1)
Sodium: 135 mmol/L (ref 135–145)
Sodium: 137 mmol/L (ref 135–145)

## 2021-09-05 LAB — CBC
HCT: 39.7 % (ref 39.0–52.0)
HCT: 40.8 % (ref 39.0–52.0)
Hemoglobin: 13.1 g/dL (ref 13.0–17.0)
Hemoglobin: 13.1 g/dL (ref 13.0–17.0)
MCH: 27.3 pg (ref 26.0–34.0)
MCH: 27.6 pg (ref 26.0–34.0)
MCHC: 32.1 g/dL (ref 30.0–36.0)
MCHC: 33 g/dL (ref 30.0–36.0)
MCV: 83.8 fL (ref 80.0–100.0)
MCV: 85.2 fL (ref 80.0–100.0)
Platelets: 164 10*3/uL (ref 150–400)
Platelets: 188 10*3/uL (ref 150–400)
RBC: 4.74 MIL/uL (ref 4.22–5.81)
RBC: 4.79 MIL/uL (ref 4.22–5.81)
RDW: 14.6 % (ref 11.5–15.5)
RDW: 14.6 % (ref 11.5–15.5)
WBC: 10 10*3/uL (ref 4.0–10.5)
WBC: 7.8 10*3/uL (ref 4.0–10.5)
nRBC: 0 % (ref 0.0–0.2)
nRBC: 0 % (ref 0.0–0.2)

## 2021-09-05 LAB — ECHOCARDIOGRAM COMPLETE
AR max vel: 3.74 cm2
AV Area VTI: 3.29 cm2
AV Area mean vel: 3.29 cm2
AV Mean grad: 4 mmHg
AV Peak grad: 7 mmHg
Ao pk vel: 1.32 m/s
Area-P 1/2: 3.36 cm2
Height: 68 in
MV VTI: 2.45 cm2
S' Lateral: 2.6 cm
Weight: 5264 oz

## 2021-09-05 LAB — HIV ANTIBODY (ROUTINE TESTING W REFLEX): HIV Screen 4th Generation wRfx: NONREACTIVE

## 2021-09-05 LAB — SEDIMENTATION RATE: Sed Rate: 44 mm/hr — ABNORMAL HIGH (ref 0–20)

## 2021-09-05 LAB — D-DIMER, QUANTITATIVE: D-Dimer, Quant: 0.49 ug/mL-FEU (ref 0.00–0.50)

## 2021-09-05 LAB — TROPONIN I (HIGH SENSITIVITY)
Troponin I (High Sensitivity): 25 ng/L — ABNORMAL HIGH (ref <18)
Troponin I (High Sensitivity): 28 ng/L — ABNORMAL HIGH (ref <18)

## 2021-09-05 LAB — HEPARIN LEVEL (UNFRACTIONATED): Heparin Unfractionated: 0.2 IU/mL — ABNORMAL LOW (ref 0.30–0.70)

## 2021-09-05 LAB — C-REACTIVE PROTEIN: CRP: 0.8 mg/dL (ref <1.0)

## 2021-09-05 LAB — PROTIME-INR
INR: 1.1 (ref 0.8–1.2)
Prothrombin Time: 13.7 seconds (ref 11.4–15.2)

## 2021-09-05 LAB — GLUCOSE, CAPILLARY
Glucose-Capillary: 125 mg/dL — ABNORMAL HIGH (ref 70–99)
Glucose-Capillary: 164 mg/dL — ABNORMAL HIGH (ref 70–99)

## 2021-09-05 LAB — BRAIN NATRIURETIC PEPTIDE: B Natriuretic Peptide: 44.3 pg/mL (ref 0.0–100.0)

## 2021-09-05 LAB — APTT: aPTT: 29 seconds (ref 24–36)

## 2021-09-05 LAB — CBG MONITORING, ED: Glucose-Capillary: 121 mg/dL — ABNORMAL HIGH (ref 70–99)

## 2021-09-05 MED ORDER — NYSTATIN 100000 UNIT/GM EX POWD
Freq: Two times a day (BID) | CUTANEOUS | Status: DC
  Administered 2021-09-06: 1 via TOPICAL
  Filled 2021-09-05: qty 15

## 2021-09-05 MED ORDER — NITROGLYCERIN 0.4 MG SL SUBL: 0.4000 mg | SUBLINGUAL_TABLET | Freq: Once | SUBLINGUAL | Status: AC

## 2021-09-05 MED ORDER — MAGNESIUM HYDROXIDE 400 MG/5ML PO SUSP: 30.0000 mL | Freq: Every day | ORAL | Status: DC | PRN

## 2021-09-05 MED ORDER — REGADENOSON 0.4 MG/5ML IV SOLN
0.4000 mg | Freq: Once | INTRAVENOUS | Status: AC
  Administered 2021-09-05: 0.4 mg via INTRAVENOUS

## 2021-09-05 MED ORDER — INSULIN ASPART 100 UNIT/ML IJ SOLN: 0.0000 [IU] | Freq: Every day | INTRAMUSCULAR | Status: DC

## 2021-09-05 MED ORDER — ENOXAPARIN SODIUM 80 MG/0.8ML IJ SOSY
0.5000 mg/kg | PREFILLED_SYRINGE | INTRAMUSCULAR | Status: DC
  Administered 2021-09-05 – 2021-09-09 (×4): 75 mg via SUBCUTANEOUS
  Filled 2021-09-05 (×3): qty 0.8
  Filled 2021-09-05: qty 0.75

## 2021-09-05 MED ORDER — HEPARIN (PORCINE) 25000 UT/250ML-% IV SOLN
1550.0000 [IU]/h | INTRAVENOUS | Status: DC
  Administered 2021-09-05: 1350 [IU]/h via INTRAVENOUS
  Filled 2021-09-05: qty 250

## 2021-09-05 MED ORDER — TRAZODONE HCL 50 MG PO TABS
25.0000 mg | ORAL_TABLET | Freq: Every evening | ORAL | Status: DC | PRN
  Administered 2021-09-06: 25 mg via ORAL
  Filled 2021-09-05: qty 0.5

## 2021-09-05 MED ORDER — NITROGLYCERIN 0.4 MG SL SUBL
SUBLINGUAL_TABLET | SUBLINGUAL | Status: AC
  Administered 2021-09-05: 0.4 mg via SUBLINGUAL
  Filled 2021-09-05: qty 1

## 2021-09-05 MED ORDER — FUROSEMIDE 20 MG PO TABS: 20.0000 mg | ORAL_TABLET | Freq: Every day | ORAL | Status: DC | PRN

## 2021-09-05 MED ORDER — FAMOTIDINE 20 MG PO TABS
40.0000 mg | ORAL_TABLET | Freq: Every day | ORAL | Status: DC
  Administered 2021-09-05 – 2021-09-09 (×4): 40 mg via ORAL
  Filled 2021-09-05 (×4): qty 2

## 2021-09-05 MED ORDER — GLIPIZIDE 5 MG PO TABS
5.0000 mg | ORAL_TABLET | Freq: Two times a day (BID) | ORAL | Status: DC
  Administered 2021-09-05 – 2021-09-09 (×8): 5 mg via ORAL
  Filled 2021-09-05 (×10): qty 1

## 2021-09-05 MED ORDER — ALBUTEROL SULFATE HFA 108 (90 BASE) MCG/ACT IN AERS
1.0000 | INHALATION_SPRAY | Freq: Four times a day (QID) | RESPIRATORY_TRACT | Status: DC | PRN
Start: 2021-09-05 — End: 2021-09-05

## 2021-09-05 MED ORDER — ATORVASTATIN CALCIUM 10 MG PO TABS
10.0000 mg | ORAL_TABLET | Freq: Every day | ORAL | Status: DC
  Administered 2021-09-05 – 2021-09-07 (×3): 10 mg via ORAL
  Filled 2021-09-05 (×3): qty 1

## 2021-09-05 MED ORDER — ENALAPRIL MALEATE 10 MG PO TABS
10.0000 mg | ORAL_TABLET | Freq: Two times a day (BID) | ORAL | Status: DC
  Administered 2021-09-05 – 2021-09-09 (×8): 10 mg via ORAL
  Filled 2021-09-05 (×9): qty 1

## 2021-09-05 MED ORDER — HEPARIN BOLUS VIA INFUSION
1550.0000 [IU] | Freq: Once | INTRAVENOUS | Status: DC
  Filled 2021-09-05: qty 1550

## 2021-09-05 MED ORDER — NITROGLYCERIN 0.4 MG SL SUBL
0.4000 mg | SUBLINGUAL_TABLET | SUBLINGUAL | Status: DC | PRN
  Administered 2021-09-05: 0.4 mg via SUBLINGUAL
  Filled 2021-09-05 (×2): qty 1

## 2021-09-05 MED ORDER — EMPAGLIFLOZIN 25 MG PO TABS
25.0000 mg | ORAL_TABLET | Freq: Every day | ORAL | Status: DC
  Administered 2021-09-05 – 2021-09-09 (×4): 25 mg via ORAL
  Filled 2021-09-05 (×5): qty 1

## 2021-09-05 MED ORDER — ACETAMINOPHEN 325 MG PO TABS
650.0000 mg | ORAL_TABLET | Freq: Four times a day (QID) | ORAL | Status: DC | PRN
  Administered 2021-09-08: 650 mg via ORAL
  Filled 2021-09-05: qty 2

## 2021-09-05 MED ORDER — ONDANSETRON HCL 4 MG PO TABS: 4.0000 mg | ORAL_TABLET | Freq: Four times a day (QID) | ORAL | Status: DC | PRN

## 2021-09-05 MED ORDER — SODIUM CHLORIDE 0.9 % IV SOLN: INTRAVENOUS | Status: DC

## 2021-09-05 MED ORDER — DULOXETINE HCL 30 MG PO CPEP
60.0000 mg | ORAL_CAPSULE | Freq: Every day | ORAL | Status: DC
  Administered 2021-09-05 – 2021-09-09 (×4): 60 mg via ORAL
  Filled 2021-09-05 (×3): qty 2
  Filled 2021-09-05: qty 1

## 2021-09-05 MED ORDER — ASPIRIN 81 MG PO CHEW
324.0000 mg | CHEWABLE_TABLET | Freq: Once | ORAL | Status: AC
  Administered 2021-09-05: 324 mg via ORAL
  Filled 2021-09-05: qty 4

## 2021-09-05 MED ORDER — INSULIN ASPART 100 UNIT/ML IJ SOLN
0.0000 [IU] | Freq: Three times a day (TID) | INTRAMUSCULAR | Status: DC
  Administered 2021-09-05 (×2): 1 [IU] via SUBCUTANEOUS
  Administered 2021-09-06: 3 [IU] via SUBCUTANEOUS
  Administered 2021-09-06: 1 [IU] via SUBCUTANEOUS
  Administered 2021-09-07: 2 [IU] via SUBCUTANEOUS
  Administered 2021-09-07: 3 [IU] via SUBCUTANEOUS
  Administered 2021-09-08: 2 [IU] via SUBCUTANEOUS
  Administered 2021-09-09: 3 [IU] via SUBCUTANEOUS
  Administered 2021-09-09: 2 [IU] via SUBCUTANEOUS
  Filled 2021-09-05 (×9): qty 1

## 2021-09-05 MED ORDER — CARVEDILOL 12.5 MG PO TABS
12.5000 mg | ORAL_TABLET | Freq: Two times a day (BID) | ORAL | Status: DC
  Administered 2021-09-05 – 2021-09-09 (×8): 12.5 mg via ORAL
  Filled 2021-09-05: qty 2
  Filled 2021-09-05 (×7): qty 1

## 2021-09-05 MED ORDER — ONDANSETRON HCL 4 MG/2ML IJ SOLN: 4.0000 mg | Freq: Four times a day (QID) | INTRAMUSCULAR | Status: DC | PRN

## 2021-09-05 MED ORDER — HEPARIN BOLUS VIA INFUSION
4000.0000 [IU] | Freq: Once | INTRAVENOUS | Status: AC
  Administered 2021-09-05: 4000 [IU] via INTRAVENOUS
  Filled 2021-09-05: qty 4000

## 2021-09-05 MED ORDER — TECHNETIUM TC 99M TETROFOSMIN IV KIT
30.0000 | PACK | Freq: Once | INTRAVENOUS | Status: AC | PRN
  Administered 2021-09-05: 30.94 via INTRAVENOUS
  Filled 2021-09-05: qty 30

## 2021-09-05 MED ORDER — ALBUTEROL SULFATE (2.5 MG/3ML) 0.083% IN NEBU: 2.5000 mg | INHALATION_SOLUTION | Freq: Four times a day (QID) | RESPIRATORY_TRACT | Status: DC | PRN

## 2021-09-05 MED ORDER — ACETAMINOPHEN 650 MG RE SUPP: 650.0000 mg | Freq: Four times a day (QID) | RECTAL | Status: DC | PRN

## 2021-09-05 NOTE — Consult Note (Signed)
? ? ? ?Cardiology Consultation:  ? ?Patient ID: Brett Marshall; [id number]; 25-Jan-1966  ? ?Admit date: 09/04/2021 ?Date of Consult: 09/05/2021 ? ?Primary Care Provider: [name], MD ?Primary Cardiologist: Fletcher Anon ?Primary Electrophysiologist:  None ? ? ?Patient Profile:  ? ?Brett Marshall is a 56 y.o. male with a hx of nonobstructive CAD by LHC in 2019, NICM, DM 2, HTN with orthostatic dizziness, morbid obesity, OSA on CPAP, and degenerative disc disease who is being seen today for the evaluation of chest pain at the request of Dr. Sidney Ace. ? ?History of Present Illness:  ? ?Brett Marshall underwent LHC in 08/2017 which showed mild nonobstructive CAD with mildly reduced LV systolic function with an EF of 45 to 50% with global hypokinesis.  RHC showed moderately elevated filling pressures with a wedge pressure of 22 mmHg and mild pulmonary hypertension with high cardiac output.  Repeat echo in 01/2020 demonstrated normalization of LV systolic function with an EF of 60 to 65% with grade 1 diastolic dysfunction. ? ?Over the past 10 days, he has noted exertional sharp centralized chest discomfort with associated dyspnea and dizziness.  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  Pain is also worsened when laying flat and improves with sitting up.  No recent fevers or chills.  Due to worsening of chest pain last evening while walking to the car he presented to the ED. ? ?Vital stable.  EKG showed sinus tachycardia without acute ischemic changes as outlined below.  High sensitivity troponin 25 with a delta troponin of 28. D-dimer negative. BNP 44. CXR showed no active cardiopulmonary disease.  In the ED, he received SL NTG x3 along with IV Toradol with noted resolution of chest discomfort.  He reports his chest discomfort is reproducible to palpation when he has the pain, as palpation makes it worse.  When he is chest pain-free, palpation of the chest wall  does not reproduce his symptoms.  Currently chest pain-free. ? ?Past Medical History:  ?Diagnosis Date  ? Asthma   ? CAD (coronary artery disease)   ? a. 08/2017 Cath: LM nl, lAD 30ost, 102m 30d, RI min irregs, LCX min irregs, RCA small, 40p, EF 45-50%, diff HK.  ? DDD (degenerative disc disease), lumbar   ? Depression   ? Diabetes mellitus without complication ([place]   ? Essential hypertension, benign   ? HFrEF (heart failure with reduced ejection fraction) ([place]   ? a. 08/2017 LV gram: EF 45-50%, glob HK.  ? Leg swelling   ? Left  ? Low back pain   ? Morbid obesity (HE[place]   ? NICM (nonischemic cardiomyopathy) ([place]wood [place]   ? a. 08/2017 LV gram: EF 45-50%, global HK.  ? OSA on CPAP   ? non compliant  ? Pulmonary hypertension ([place]   ? ? ?Past Surgical History:  ?Procedure Laterality Date  ? CARDIAC CATHETERIZATION    ? [place] no stents.  ? COLONOSCOPY WITH PROPOFOL N/A 04/11/2019  ? Procedure: COLONOSCOPY WITH PROPOFOL;  Surgeon: WLucilla Lame MD;  [facility];  Service: Endoscopy;  Laterality: N/A;  ? RIGHT/LEFT HEART CATH AND CORONARY ANGIOGRAPHY N/A 09/02/2017  ? Procedure: RIGHT/LEFT HEART CATH AND CORONARY ANGIOGRAPHY;  Surgeon: AWellington Hampshire MD;  [facility];  Service: Cardiovascular;  Laterality: N/A;  ?  ? ?Home Meds: ?Prior to Admission medications   ?Medication Sig Start Date End Date Taking? Authorizing Provider  ?  albuterol (VENTOLIN HFA) 108 (90 Base) MCG/ACT inhaler Inhale 1-2 puffs into the lungs every 6 (six) hours as needed for wheezing or shortness of breath. 09/11/20  Yes Martyn Ehrich, NP  ?atorvastatin (LIPITOR) 10 MG tablet Take 10 mg by mouth daily at 6 PM.  08/12/18 09/05/21 Yes [provider]  ?carvedilol (COREG) 12.5 MG tablet Take 1 tablet (12.5 mg total) by mouth 2 (two) times daily. 07/10/21 07/10/22 Yes Wellington Hampshire, MD  ?DULoxetine (CYMBALTA) 60 MG capsule Take 120 mg by mouth daily. 07/21/21  Yes [provider]  ?enalapril (VASOTEC)  10 MG tablet Take 1 tablet (10 mg total) by mouth 2 (two) times daily. 01/01/21  Yes Wellington Hampshire, MD  ?famotidine (PEPCID) 40 MG tablet Take 40 mg by mouth daily.  08/14/18  Yes [provider]  ?furosemide (LASIX) 20 MG tablet Take 1 tablet (20 mg total) by mouth daily as needed (for SOB or weight gain of 3 lbs overnight). 07/14/21  Yes Wellington Hampshire, MD  ?glipiZIDE (GLUCOTROL) 5 MG tablet Take 1 tablet (5 mg total) by mouth 2 (two) times daily before a meal. 12/09/17  Yes [name], MD  ?JARDIANCE 25 MG TABS tablet Take 25 mg by mouth daily. 06/06/20  Yes [provider]  ?metFORMIN (GLUCOPHAGE) 1000 MG tablet Take 1 tablet (1,000 mg total) by mouth 2 (two) times daily with a meal. 12/09/17  Yes [name], MD  ?traZODone (DESYREL) 50 MG tablet Take 50 mg by mouth at bedtime. 08/19/21  Yes [provider]  ?cyclobenzaprine (FLEXERIL) 10 MG tablet Take 1 tablet (10 mg total) by mouth 3 (three) times daily as needed.For spasms ?Patient not taking: Reported on 09/05/2021 11/20/17   [name], MD  ?glucose blood (GE100 BLOOD GLUCOSE TEST) test strip Use as instructed 02/03/16   [name], MD  ?Lancets [facility] ULTRASOFT) lancets Check sugar twice daily 01/11/16   [name], MD  ? ? ?Inpatient Medications: ?Scheduled Meds: ? atorvastatin  10 mg Oral q1800  ? carvedilol  12.5 mg Oral BID  ? DULoxetine  60 mg Oral Daily  ? empagliflozin  25 mg Oral Daily  ? enalapril  10 mg Oral BID  ? famotidine  40 mg Oral Daily  ? glipiZIDE  5 mg Oral BID AC  ? ?Continuous Infusions: ? sodium chloride 100 mL/hr at 09/05/21 0410  ? heparin 1,350 Units/hr (09/05/21 0224)  ? ?PRN Meds: ?acetaminophen **OR** acetaminophen, albuterol, furosemide, magnesium hydroxide, nitroGLYCERIN, ondansetron **OR** ondansetron (ZOFRAN) IV, traZODone ? ?Allergies:  No Known Allergies ? ?Social History:   ?Social History  ? ?Socioeconomic History  ? Marital status: Married  ?  Spouse name: Not on file   ? Number of children: Not on file  ? Years of education: Not on file  ? Highest education level: Not on file  ?Occupational History  ? Occupation: Dance movement psychotherapist  ?  Employer: [employer]  ?Tobacco Use  ? Smoking status: Never  ? Smokeless tobacco: Never  ?Vaping Use  ? Vaping Use: Never used  ?Substance and Sexual Activity  ? Alcohol use: Yes  ?  Comment: few times per year  ? Drug use: No  ? Sexual activity: Yes  ?Other Topics Concern  ? Not on file  ?Social History Narrative  ? Goes to gym.  ? ?Social Determinants of Health  ? ?Financial Resource Strain: Not on file  ?Food Insecurity: Not on file  ?Transportation Needs: Not on file  ?  Physical Activity: Not on file  ?Stress: Not on file  ?Social Connections: Not on file  ?Intimate Partner Violence: Not on file  ?  ? ?Family History:   ?Family History  ?Problem Relation Age of Onset  ? Diabetes Mother   ? Asthma Mother   ? Heart disease Father   ? Heart attack Father   ? Sjogren's syndrome Sister   ? Heart disease Brother 42  ?     CAD/CABG  ? ? ?ROS:  ?Review of Systems  ?Constitutional:  Positive for malaise/fatigue. Negative for chills, diaphoresis, fever and weight loss.  ?HENT:  Negative for congestion.   ?Eyes:  Negative for discharge and redness.  ?Respiratory:  Positive for shortness of breath. Negative for cough, sputum production and wheezing.   ?Cardiovascular:  Positive for chest pain. Negative for palpitations, orthopnea, claudication, leg swelling and PND.  ?Gastrointestinal:  Negative for abdominal pain, heartburn, nausea and vomiting.  ?Musculoskeletal:  Negative for falls and myalgias.  ?Skin:  Negative for rash.  ?Neurological:  Positive for dizziness. Negative for tingling, tremors, sensory change, speech change, focal weakness, loss of consciousness and weakness.  ?Endo/Heme/Allergies:  Does not bruise/bleed easily.  ?Psychiatric/Behavioral:  Negative for substance abuse. The patient is not nervous/anxious.   ?All other systems reviewed and are  negative.   ? ?Physical Exam/Data:  ? ?Vitals:  ? 09/05/21 0500 09/05/21 0600 09/05/21 0800 09/05/21 0830  ?BP: (!) 105/59 94/61 110/65   ?Pulse: 97 86 89 79  ?Resp: 17 18 (!) 25 18  ?Temp:      ?TempS

## 2021-09-05 NOTE — ED Notes (Signed)
Pt still in NST at this time. ? ?

## 2021-09-05 NOTE — Assessment & Plan Note (Signed)
-   We will place him on supplement coverage with NovoLog. ?- We will continue Jardiance and Glucotrol. ?

## 2021-09-05 NOTE — Assessment & Plan Note (Addendum)
-   This is suspicious for unstable angina. ?- The patient will be admitted to an observation cardiac telemetry bed. ?- We will continue on IV heparin. ?- We will continue beta-blocker therapy as well as statin therapy, in addition to aspirin. ?- She will be placed on current sublingual nitroglycerin, ACE inhibitor therapy, and morphine sulfate for pain. ?- Cardiology consult will be obtained. ?- I notified Dr. Rockey Situ about the patient. ?

## 2021-09-05 NOTE — Progress Notes (Signed)
Patient seen and examined in the emergency room.  Currently chest pain-free.  Admitted by nighttime hospitalist early morning hours, history physical reviewed, discussed and updated the patient. ? ?In brief, 56 year old gentleman with history of nonobstructive coronary artery disease with left heart cath in 2019, nonischemic cardiomyopathy, type 2 diabetes on oral hypoglycemics, hypertension with orthostatic changes, obstructive sleep apnea not using CPAP, morbid obesity with BMI 50 presented with episodes of left precordial and midsternal chest pain not related to exertion but more so on laying flat.  Mildly elevated troponins and trending down.  EKG with no ischemic changes.  Started on heparin and admitted to the hospital. ? ?Chest pain with both typical and atypical features: ?Currently chest pain-free.  Continue heparin infusion.  2D echocardiogram planned.  Scheduled for Lexiscan.  Followed by cardiology.  Depends upon the results on echocardiogram and Lexiscan, cardiology will decide further invasive testing.  Likely conservative management. ?Continue all home medications.  We will hold off on his metformin in case he goes to cardiac cath.  N.p.o. until the test. ? ? ?Total time spent in patient care 35 minutes, same-day admission.  No charge visit. ?

## 2021-09-05 NOTE — Progress Notes (Signed)
ANTICOAGULATION CONSULT NOTE  ? ?Pharmacy Consult for heparin infusion ?Indication: ACS/STEMI ? ?No Known Allergies ? ?Patient Measurements: ?Height: 5' 8"  (172.7 cm) ?Weight: (!) 149.2 kg (329 lb) ?IBW/kg (Calculated) : 68.4 ?Heparin Dosing Weight: 104.6 kg ? ?Vital Signs: ?Temp: 98.3 ?F (36.8 ?C) (04/20 2325) ?Temp Source: Oral (04/20 2325) ?BP: 107/65 (04/21 0117) ?Pulse Rate: 91 (04/21 0117) ? ?Labs: ?Recent Labs  ?  09/04/21 ?2355 09/05/21 ?0116  ?HGB 13.1  --   ?HCT 39.7  --   ?PLT 188  --   ?CREATININE 1.22  --   ?TROPONINIHS 25* 28*  ? ? ?Estimated Creatinine Clearance: 96.3 mL/min (by C-G formula based on SCr of 1.22 mg/dL). ? ? ?Medical History: ?Past Medical History:  ?Diagnosis Date  ? Asthma   ? CAD (coronary artery disease)   ? a. 08/2017 Cath: LM nl, lAD 30ost, 28m 30d, RI min irregs, LCX min irregs, RCA small, 40p, EF 45-50%, diff HK.  ? DDD (degenerative disc disease), lumbar   ? Depression   ? Diabetes mellitus without complication ([place]   ? Essential hypertension, benign   ? HFrEF (heart failure with reduced ejection fraction) ([place]   ? a. 08/2017 LV gram: EF 45-50%, glob HK.  ? Leg swelling   ? Left  ? Low back pain   ? Morbid obesity ([place]   ? NICM (nonischemic cardiomyopathy) ([facility]   ? a. 08/2017 LV gram: EF 45-50%, global HK.  ? OSA on CPAP   ? non compliant  ? Pulmonary hypertension ([place]   ? ? ?Assessment: ?Pt is 56yo old male presenting to ED c/o intermittent chest pain & L-sided pressure x 1 wk found w/ abnormal EKG currently unable to rule out Anterior infarction. ? ?Goal of Therapy:  ?Heparin level 0.3-0.7 units/ml ?Monitor platelets by anticoagulation protocol: Yes ?  ?Plan:  ?Bolus 4000 units x 1 ?Start heparin infusion at 1350 units/hr ?Check HL in 6 hr after start of infusion ?CBC daily while on heparin ? ?NRenda Rolls PharmD, MBA ?09/05/2021 ?2:16 AM ? ? ? ?

## 2021-09-05 NOTE — Progress Notes (Signed)
ANTICOAGULATION CONSULT NOTE  ? ?Pharmacy Consult for heparin infusion ?Indication: ACS/STEMI ? ?No Known Allergies ? ?Patient Measurements: ?Height: 5' 8"  (172.7 cm) ?Weight: (!) 149.2 kg (329 lb) ?IBW/kg (Calculated) : 68.4 ?Heparin Dosing Weight: 104.6 kg ? ?Vital Signs: ?Temp: 98.3 ?F (36.8 ?C) (04/20 2325) ?Temp Source: Oral (04/20 2325) ?BP: 110/65 (04/21 0800) ?Pulse Rate: 79 (04/21 0830) ? ?Labs: ?Recent Labs  ?  09/04/21 ?2355 09/05/21 ?0116 09/05/21 ?0228 09/05/21 ?1007  ?HGB 13.1  --   --  13.1  ?HCT 39.7  --   --  40.8  ?PLT 188  --   --  164  ?APTT  --   --  29  --   ?LABPROT  --   --  13.7  --   ?INR  --   --  1.1  --   ?HEPARINUNFRC  --   --   --  0.20*  ?CREATININE 1.22  --   --   --   ?TROPONINIHS 25* 28*  --   --   ? ? ? ?Estimated Creatinine Clearance: 96.3 mL/min (by C-G formula based on SCr of 1.22 mg/dL). ? ? ?Medical History: ?Past Medical History:  ?Diagnosis Date  ? Asthma   ? CAD (coronary artery disease)   ? a. 08/2017 Cath: LM nl, lAD 30ost, 12m 30d, RI min irregs, LCX min irregs, RCA small, 40p, EF 45-50%, diff HK.  ? DDD (degenerative disc disease), lumbar   ? Depression   ? Diabetes mellitus without complication ([place]   ? Essential hypertension, benign   ? HFrEF (heart failure with reduced ejection fraction) ([place]   ? a. 08/2017 LV gram: EF 45-50%, glob HK.  ? Leg swelling   ? Left  ? Low back pain   ? Morbid obesity ([place]   ? NICM (nonischemic cardiomyopathy) ([place]   ? a. 08/2017 LV gram: EF 45-50%, global HK.  ? OSA on CPAP   ? non compliant  ? Pulmonary hypertension ([place]   ? ? ?Assessment: ?Pt is 56yo old male presenting to ED c/o intermittent chest pain & L-sided pressure x 1 wk found w/ abnormal EKG currently unable to rule out Anterior infarction. ? ?Goal of Therapy:  ?Heparin level 0.3-0.7 units/ml ?Monitor platelets by anticoagulation protocol: Yes ?  ?Plan:  ?4/21 1007 HL = 0.20 (Subtherapeutic) ?Bolus 1550 units x 1 ?Increase heparin infusion to 1550 units/hr ?Check HL in 6  hr after rate change ?CBC daily while on heparin ? ?CDorothe Pea PharmD, BCPS ?Clinical Pharmacist   ?09/05/2021 ?10:29 AM ? ? ? ?

## 2021-09-05 NOTE — Assessment & Plan Note (Signed)
-   We will continue his antihypertensives. 

## 2021-09-05 NOTE — Progress Notes (Signed)
Mobility Specialist - Progress Note ? ? 09/05/21 1600  ?Mobility  ?Activity Ambulated independently in hallway;Stood at bedside;Dangled on edge of bed  ?Level of Assistance Independent  ?Assistive Device None  ?Distance Ambulated (ft) 320 ft  ?Activity Response Tolerated well  ?$Mobility charge 1 Mobility  ? ? ? ?Pre-mobility: HR, BP, SpO2 ?During mobility: HR, BP, SpO2 ?Post-mobility: HR, BP, SPO2 ? ?Pt supine upon arrival using RA with NT present. Pt completes bed mobility with ModI needed a HHA for UE strength. Completes STS indep and ambulates with supervision 2 laps. Voices mild SOB and 3/10 chest pain upon arrival. Pt left EOB with needs in reach. ? ?Brett Marshall ?Mobility Specialist ?09/05/21, 4:36 PM ? ? ?

## 2021-09-05 NOTE — Progress Notes (Signed)
*  PRELIMINARY RESULTS* ?Echocardiogram ?2D Echocardiogram has been performed. ? ?Brett Marshall, [place] ?09/05/2021, 12:03 PM ?

## 2021-09-05 NOTE — Progress Notes (Signed)
Anticoagulation monitoring(Lovenox): ? ?56yo  M ordered Lovenox 40 mg Q24h ?   ?Filed Weights  ? 09/04/21 2323  ?Weight: (!) 149.2 kg (329 lb)  ? ?BMI 50  ? ?Lab Results  ?Component Value Date  ? CREATININE 1.06 09/05/2021  ? CREATININE 1.22 09/04/2021  ? CREATININE 1.13 03/29/2019  ? ?Estimated Creatinine Clearance: 110.8 mL/min (by C-G formula based on SCr of 1.06 mg/dL). ?Hemoglobin & Hematocrit  ?   ?Component Value Date/Time  ? HGB 13.1 09/05/2021 1007  ? HGB 13.4 12/08/2017 1151  ? HCT 40.8 09/05/2021 1007  ? HCT 40.8 12/08/2017 1151  ? ? ? ?Per Protocol for Patient with estCrcl >30 ml/min and BMI >30, will transition to Lovenox 0.5 mg/kg Q24h  ?  ?  ?Chinita Greenland PharmD ?Clinical Pharmacist ?09/05/2021 ? ?

## 2021-09-05 NOTE — H&P (Signed)
?  ?  ?[facility] ? ? ?PATIENT NAME: Brett Marshall   ? ?MR#:  [id number] ? ?DATE OF BIRTH:  30-Mar-1966 ? ?DATE OF ADMISSION:  09/04/2021 ? ?PRIMARY CARE PHYSICIAN: [name], MD  ? ?Patient is coming from: Home ? ?REQUESTING/REFERRING PHYSICIAN: Ward, Delice Bison, DO ?CHIEF COMPLAINT:  ? ?Chief Complaint  ?Patient presents with  ?? Chest Pain  ? ? ?HISTORY OF PRESENT ILLNESS:  ?Brett Marshall is a 56 y.o.  male with medical history significant for multiple medical problems that are mentioned below, who presented to the ER with acute onset of midsternal chest pain felt as a sharp pain and graded 10/10 in severity with estimated dyspnea and mild palpitations without nausea or vomiting or diaphoresis.  No cough or wheezing or hemoptysis.  No leg pain or edema or recent travels or surgeries.  No dysuria, dysuria or hematuria or flank pain.  No bleeding diathesis. ? ?ED Course: Upon presentation to the ER, heart rate was 104 with otherwise normal vital signs.  Labs revealed a troponin I of 25 and later 28.  CBC was within normal.  D-dimer 0.49. ? ?EKG as reviewed by me : Home EKG showed sinus tachycardia with rate of 109 with low voltage QRS. ?Imaging: Chest x-ray showed no acute cardiopulmonary disease. ? ?The patient was given 30 mg IV Toradol, sublingual nitroglycerin X3 with significant improvement of chest pain.,  4 baby aspirin and was started on IV heparin with bolus and drip.  He will be admitted to a cardiac telemetry observation bed for further evaluation and management. ?PAST MEDICAL HISTORY:  ? ?Past Medical History:  ?Diagnosis Date  ?? Asthma   ?? CAD (coronary artery disease)   ? a. 08/2017 Cath: LM nl, lAD 30ost, 11m 30d, RI min irregs, LCX min irregs, RCA small, 40p, EF 45-50%, diff HK.  ?? DDD (degenerative disc disease), lumbar   ?? Depression   ?? Diabetes mellitus without complication ([place]   ?? Essential hypertension, benign   ?? HFrEF (heart failure with reduced ejection fraction) ([facility]   ? a.  08/2017 LV gram: EF 45-50%, glob HK.  ?? Leg swelling   ? Left  ?? Low back pain   ?? Morbid obesity ([place]   ?? NICM (nonischemic cardiomyopathy) ([facility]   ? a. 08/2017 LV gram: EF 45-50%, global HK.  ?? OSA on CPAP   ? non compliant  ?? Pulmonary hypertension ([place]   ? ? ?PAST SURGICAL HISTORY:  ? ?Past Surgical History:  ?Procedure Laterality Date  ?? CARDIAC CATHETERIZATION    ? [place] no stents.  ?? COLONOSCOPY WITH PROPOFOL N/A 04/11/2019  ? Procedure: COLONOSCOPY WITH PROPOFOL;  Surgeon: WLucilla Lame MD;  [facility];  Service: Endoscopy;  Laterality: N/A;  ?? RIGHT/LEFT HEART CATH AND CORONARY ANGIOGRAPHY N/A 09/02/2017  ? Procedure: RIGHT/LEFT HEART CATH AND CORONARY ANGIOGRAPHY;  Surgeon: AWellington Hampshire MD;  [facility];  Service: Cardiovascular;  Laterality: N/A;  ? ? ?SOCIAL HISTORY:  ? ?Social History  ? ?Tobacco Use  ?? Smoking status: Never  ?? Smokeless tobacco: Never  ?Substance Use Topics  ?? Alcohol use: Yes  ?  Comment: few times per year  ? ? ?FAMILY HISTORY:  ? ?Family History  ?Problem Relation Age of Onset  ?? Diabetes Mother   ?? Asthma Mother   ?? Heart disease Father   ?? Heart attack Father   ?? Sjogren's syndrome Sister   ?? Heart disease Brother [phone] ?  CAD/CABG  ? ? ?DRUG ALLERGIES:  ?No Known Allergies ? ?REVIEW OF SYSTEMS:  ? ?ROS ?As per history of present illness. All pertinent systems were reviewed above. Constitutional, HEENT, cardiovascular, respiratory, GI, GU, musculoskeletal, neuro, psychiatric, endocrine, integumentary and hematologic systems were reviewed and are otherwise negative/unremarkable except for positive findings mentioned above in the HPI. ? ? ?MEDICATIONS AT HOME:  ? ?Prior to Admission medications   ?Medication Sig Start Date End Date Taking? Authorizing Provider  ?albuterol (VENTOLIN HFA) 108 (90 Base) MCG/ACT inhaler Inhale 1-2 puffs into the lungs every 6 (six) hours as needed for wheezing or shortness of breath. 09/11/20    Martyn Ehrich, NP  ?atorvastatin (LIPITOR) 10 MG tablet Take 10 mg by mouth daily at 6 PM.  08/12/18 07/10/21  [provider]  ?carvedilol (COREG) 12.5 MG tablet Take 1 tablet (12.5 mg total) by mouth 2 (two) times daily. 07/10/21 07/10/22  Wellington Hampshire, MD  ?cyclobenzaprine (FLEXERIL) 10 MG tablet Take 1 tablet (10 mg total) by mouth 3 (three) times daily as needed.For spasms 11/20/17   [name], MD  ?DULoxetine (CYMBALTA) 30 MG capsule Take 1 capsule (30 mg total) by mouth daily. ?Patient taking differently: Take 60 mg by mouth daily. 08/23/17   [name], MD  ?enalapril (VASOTEC) 10 MG tablet Take 1 tablet (10 mg total) by mouth 2 (two) times daily. 01/01/21   Wellington Hampshire, MD  ?famotidine (PEPCID) 40 MG tablet Take 40 mg by mouth daily.  08/14/18   [provider]  ?furosemide (LASIX) 20 MG tablet Take 1 tablet (20 mg total) by mouth daily as needed (for SOB or weight gain of 3 lbs overnight). 07/14/21   Wellington Hampshire, MD  ?glipiZIDE (GLUCOTROL) 5 MG tablet Take 1 tablet (5 mg total) by mouth 2 (two) times daily before a meal. 12/09/17   [name], MD  ?glucose blood (GE100 BLOOD GLUCOSE TEST) test strip Use as instructed 02/03/16   [name], MD  ?JARDIANCE 25 MG TABS tablet Take 25 mg by mouth daily. 06/06/20   [provider]  ?Lancets Glory [place] ULTRASOFT) lancets Check sugar twice daily 01/11/16   [name], MD  ?metFORMIN (GLUCOPHAGE) 1000 MG tablet Take 1 tablet (1,000 mg total) by mouth 2 (two) times daily with a meal. 12/09/17   [name], MD  ? ?  ? ?VITAL SIGNS:  ?Blood pressure 94/61, pulse 86, temperature 98.3 ?F (36.8 ?C), temperature source Oral, resp. rate 18, height 5' 8"  (1.727 m), weight (!) 149.2 kg, SpO2 95 %. ? ?PHYSICAL EXAMINATION:  ?Physical Exam ? ?GENERAL:  56 y.o.-year-old patient lying in the bed with no acute distress.  ?EYES: Pupils equal, round, reactive to light and accommodation. No scleral icterus. Extraocular  muscles intact.  ?HEENT: Head atraumatic, normocephalic. Oropharynx and nasopharynx clear.  ?NECK:  Supple, no jugular venous distention. No thyroid enlargement, no tenderness.  ?LUNGS: Normal breath sounds bilaterally, no wheezing, rales,rhonchi or crepitation. No use of accessory muscles of respiration.  ?CARDIOVASCULAR: Regular rate and rhythm, S1, S2 normal. No murmurs, rubs, or gallops.  ?ABDOMEN: Soft, nondistended, nontender. Bowel sounds present. No organomegaly or mass.  ?EXTREMITIES: No pedal edema, cyanosis, or clubbing.  ?NEUROLOGIC: Cranial nerves II through XII are intact. Muscle strength 5/5 in all extremities. Sensation intact. Gait not checked.  ?PSYCHIATRIC: The patient is alert and oriented x 3.  Normal affect and good eye contact. ?SKIN: No obvious rash, lesion, or ulcer.  ? ?  LABORATORY PANEL:  ? ?CBC ?Recent Labs  ?Lab 09/04/21 ?2355  ?WBC 10.0  ?HGB 13.1  ?HCT 39.7  ?PLT 188  ? ?------------------------------------------------------------------------------------------------------------------ ? ?Chemistries  ?Recent Labs  ?Lab 09/04/21 ?2355  ?NA 135  ?K 4.4  ?CL 100  ?CO2 26  ?GLUCOSE 151*  ?BUN 21*  ?CREATININE 1.22  ?CALCIUM 9.0  ? ?------------------------------------------------------------------------------------------------------------------ ? ?Cardiac Enzymes ?No results for input(s): TROPONINI in the last 168 hours. ?------------------------------------------------------------------------------------------------------------------ ? ?RADIOLOGY:  ?DG Chest 2 View ? ?Result Date: 09/04/2021 ?CLINICAL DATA:  Chest pain. EXAM: CHEST - 2 VIEW COMPARISON:  Chest radiograph dated 09/11/2020. FINDINGS: The heart size and mediastinal contours are within normal limits. Both lungs are clear. The visualized skeletal structures are unremarkable. IMPRESSION: No active cardiopulmonary disease. Electronically Signed   By: Anner Crete M.D.   On: 09/04/2021 23:49   ? ? ? ?IMPRESSION AND PLAN:   ?Assessment and Plan: ?* Chest pain ?- This is suspicious for unstable angina. ?- The patient will be admitted to an observation cardiac telemetry bed. ?- We will continue on IV heparin. ?- We will continue

## 2021-09-05 NOTE — Assessment & Plan Note (Signed)
-   We will continue Vasotec, Coreg, and Jardiance. ?

## 2021-09-06 DIAGNOSIS — R072 Precordial pain: Secondary | ICD-10-CM | POA: Diagnosis not present

## 2021-09-06 DIAGNOSIS — I259 Chronic ischemic heart disease, unspecified: Secondary | ICD-10-CM | POA: Diagnosis not present

## 2021-09-06 DIAGNOSIS — I2510 Atherosclerotic heart disease of native coronary artery without angina pectoris: Secondary | ICD-10-CM | POA: Diagnosis not present

## 2021-09-06 DIAGNOSIS — I10 Essential (primary) hypertension: Secondary | ICD-10-CM | POA: Diagnosis not present

## 2021-09-06 DIAGNOSIS — E119 Type 2 diabetes mellitus without complications: Secondary | ICD-10-CM | POA: Diagnosis not present

## 2021-09-06 DIAGNOSIS — I5022 Chronic systolic (congestive) heart failure: Secondary | ICD-10-CM | POA: Diagnosis not present

## 2021-09-06 LAB — NM MYOCAR MULTI W/SPECT W/WALL MOTION / EF
LV dias vol: 107 mL (ref 62–150)
LV sys vol: 51 mL
Nuc Stress EF: 52 %
Peak HR: 86 {beats}/min
Percent HR: 52 %
Rest HR: 76 {beats}/min
Rest Nuclear Isotope Dose: 32 mCi
SDS: 9
SRS: 2
SSS: 8
ST Depression (mm): 0 mm
Stress Nuclear Isotope Dose: 30.9 mCi
TID: 1.25

## 2021-09-06 LAB — CBC
HCT: 40.4 % (ref 39.0–52.0)
Hemoglobin: 12.9 g/dL — ABNORMAL LOW (ref 13.0–17.0)
MCH: 27.7 pg (ref 26.0–34.0)
MCHC: 31.9 g/dL (ref 30.0–36.0)
MCV: 86.7 fL (ref 80.0–100.0)
Platelets: 171 10*3/uL (ref 150–400)
RBC: 4.66 MIL/uL (ref 4.22–5.81)
RDW: 14.6 % (ref 11.5–15.5)
WBC: 8.3 10*3/uL (ref 4.0–10.5)
nRBC: 0 % (ref 0.0–0.2)

## 2021-09-06 LAB — GLUCOSE, CAPILLARY
Glucose-Capillary: 126 mg/dL — ABNORMAL HIGH (ref 70–99)
Glucose-Capillary: 233 mg/dL — ABNORMAL HIGH (ref 70–99)
Glucose-Capillary: 98 mg/dL (ref 70–99)
Glucose-Capillary: 175 mg/dL — ABNORMAL HIGH (ref 70–99)

## 2021-09-06 MED ORDER — ASPIRIN EC 81 MG PO TBEC
81.0000 mg | DELAYED_RELEASE_TABLET | Freq: Every day | ORAL | Status: DC
  Administered 2021-09-06 – 2021-09-09 (×3): 81 mg via ORAL
  Filled 2021-09-06 (×3): qty 1

## 2021-09-06 MED ORDER — TECHNETIUM TC 99M TETROFOSMIN IV KIT
30.0000 | PACK | Freq: Once | INTRAVENOUS | Status: AC | PRN
  Administered 2021-09-06: 32 via INTRAVENOUS

## 2021-09-06 NOTE — Progress Notes (Signed)
? ? ?Progress Note ? ?Patient Name: Brett Marshall ?Date of Encounter: 09/06/2021 ? ?Primary Cardiologist: Brett Marshall ? ?Subjective  ? ?Had some mild right-sided/right central chest pain with ambulation to the restroom overnight and this morning. Some subscapular pain this morning as well.  Vitals stable.  Echo demonstrated a preserved LV systolic function with normal wall motion, grade 1 diastolic dysfunction, aortic valve sclerosis without stenosis, and a trivial pericardial effusion.  Lexiscan MPI pending. ? ?Inpatient Medications  ?  ?Scheduled Meds: ? atorvastatin  10 mg Oral q1800  ? carvedilol  12.5 mg Oral BID  ? DULoxetine  60 mg Oral Daily  ? empagliflozin  25 mg Oral Daily  ? enalapril  10 mg Oral BID  ? enoxaparin (LOVENOX) injection  0.5 mg/kg Subcutaneous Q24H  ? famotidine  40 mg Oral Daily  ? glipiZIDE  5 mg Oral BID AC  ? insulin aspart  0-5 Units Subcutaneous QHS  ? insulin aspart  0-9 Units Subcutaneous TID WC  ? nystatin   Topical BID  ? ?Continuous Infusions: ? ?PRN Meds: ?acetaminophen **OR** acetaminophen, albuterol, furosemide, magnesium hydroxide, nitroGLYCERIN, ondansetron **OR** ondansetron (ZOFRAN) IV, traZODone  ? ?Vital Signs  ?  ?Vitals:  ? 09/05/21 1529 09/05/21 2046 09/05/21 2330 09/06/21 0530  ?BP: 109/76 113/80 94/63 (!) 107/47  ?Pulse: 83 85 68 79  ?Resp: 17 18 20 18   ?Temp: 98 ?F (36.7 ?C) 98 ?F (36.7 ?C) 98.1 ?F (36.7 ?C) 97.8 ?F (36.6 ?C)  ?TempSrc:  Axillary Oral Oral  ?SpO2: 97% 100% 94% 100%  ?Weight:      ?Height:      ? ? ?Intake/Output Summary (Last 24 hours) at 09/06/2021 0706 ?Last data filed at 09/05/2021 1900 ?Gross per 24 hour  ?Intake 240 ml  ?Output 475 ml  ?Net -235 ml  ? ?Filed Weights  ? 09/04/21 2323  ?Weight: (!) 149.2 kg  ? ? ?Telemetry  ?  ?SR with arifact - Personally Reviewed ? ?ECG  ?  ?No new tracings - Personally Reviewed ? ?Physical Exam  ? ?GEN: No acute distress.   ?Neck: No JVD. ?Cardiac: RRR, no murmurs, rubs, or gallops.  ?Respiratory: Clear to  auscultation bilaterally.  ?GI: Soft, nontender, non-distended.   ?MS: No edema; No deformity. Right subscapular muscle TTP. ?Neuro:  Alert and oriented x 3; Nonfocal.  ?Psych: Normal affect. ? ?Labs  ?  ?Chemistry ?Recent Labs  ?Lab 09/04/21 ?2355 09/05/21 ?1007  ?NA 135 137  ?K 4.4 4.1  ?CL 100 102  ?CO2 26 28  ?GLUCOSE 151* 118*  ?BUN 21* 21*  ?CREATININE 1.22 1.06  ?CALCIUM 9.0 8.6*  ?GFRNONAA >60 >60  ?ANIONGAP 9 7  ?  ? ?Hematology ?Recent Labs  ?Lab 09/04/21 ?2355 09/05/21 ?1007 09/06/21 ?0401  ?WBC 10.0 7.8 8.3  ?RBC 4.74 4.79 4.66  ?HGB 13.1 13.1 12.9*  ?HCT 39.7 40.8 40.4  ?MCV 83.8 85.2 86.7  ?MCH 27.6 27.3 27.7  ?MCHC 33.0 32.1 31.9  ?RDW 14.6 14.6 14.6  ?PLT 188 164 171  ? ? ?Cardiac EnzymesNo results for input(s): TROPONINI in the last 168 hours. No results for input(s): TROPIPOC in the last 168 hours.  ? ?BNP ?Recent Labs  ?Lab 09/04/21 ?2355  ?BNP 44.3  ?  ? ?DDimer  ?Recent Labs  ?Lab 09/04/21 ?2355  ?DDIMER 0.49  ?  ? ?Radiology  ?  ?DG Chest 2 View ? ?Result Date: 09/04/2021 ?IMPRESSION: No active cardiopulmonary disease. Electronically Signed   By: Laren Everts.D.  On: 09/04/2021 23:49  ? ?Cardiac Studies  ? ?2D echo 09/05/2021: ?1. Left ventricular ejection fraction, by estimation, is 60 to 65%. The  ?left ventricle has normal function. The left ventricle has no regional  ?wall motion abnormalities. Left ventricular diastolic parameters are  ?consistent with Grade I diastolic  ?dysfunction (impaired relaxation).  ? 2. Right ventricular systolic function is normal. The right ventricular  ?size is normal.  ? 3. The mitral valve is normal in structure. No evidence of mitral valve  ?regurgitation. No evidence of mitral stenosis.  ? 4. The aortic valve is normal in structure. Aortic valve regurgitation is  ?not visualized. Aortic valve sclerosis is present, with no evidence of  ?aortic valve stenosis.  ? 5. The inferior vena cava is normal in size with greater than 50%  ?respiratory variability,  suggesting right atrial pressure of 3 mmHg.  ? 6. Trivial pericardial effusion is present. ?__________ ? ?Lexiscan MPI pending ? ?Patient Profile  ?   ?56 y.o. male with history of nonobstructive CAD by LHC in 2019, NICM, DM 2, HTN with orthostatic dizziness, morbid obesity, OSA on CPAP, and degenerative disc disease who is being seen today for the evaluation of chest pain at the request of Dr. Sidney Marshall. ? ?Assessment & Plan  ?  ?1. CAD involving the native coronary arteries with chest pain with moderate risk for cardiac etiology and mildly elevated high sensitivity troponin: ?-Pain has some typical and atypical features ?-Currently chest pain-free ?-Complete 2-day Lexiscan MPI to evaluate for high risk ischemia ?-Echo with preserved LVSF and normal wall motion with a trivial pericardial effusion ?-If Lexiscan is reassuring, could consider colchicine/NSAIDs for presumed pericarditis ?  ?2.  NICM: ?-Subsequent normalization of LV systolic function ?-Volume status is difficult to assess on physical exam secondary to body habitus, though he appears largely euvolemic and without decompensation ?-PTA carvedilol and enalapril ?-Echo with preserved LVSF as outlined above ?  ?3.  HTN:  ?-Blood pressure ?-Continue current medical therapy ?  ?4.  HLD: ?-PTA atorvastatin ?-Follow-up as outpatient ? ? ?Shared Decision Making/Informed Consent{ ? ?The risks [chest pain, shortness of breath, cardiac arrhythmias, dizziness, blood pressure fluctuations, myocardial infarction, stroke/transient ischemic attack, nausea, vomiting, allergic reaction, radiation exposure, metallic taste sensation and life-threatening complications (estimated to be 1 in 10,000)], benefits (risk stratification, diagnosing coronary artery disease, treatment guidance) and alternatives of a nuclear stress test were discussed in detail with Brett Marshall and he agrees to proceed.  ? ?For questions or updates, please contact Brett Marshall ?Please consult  [email or web address] for contact info under Cardiology/STEMI. ?  ? ?Signed, ?Brett Faith, PA-C ?[facility] ?Pager: [phone] ?09/06/2021, 7:06 AM ? ?

## 2021-09-06 NOTE — Progress Notes (Signed)
?  Transition of Care (TOC) Screening Note ? ? ?Patient Details  ?Name: Brett Marshall ?Date of Birth: Sep 24, 1965 ? ? ?Transition of Care (TOC) CM/SW Contact:    ?Alberteen Sam, LCSW ?Phone Number: ?09/06/2021, 9:12 AM ? ? ? ?Transition of Care Department [facility]) has reviewed patient and no TOC needs have been identified at this time. We will continue to monitor patient advancement through interdisciplinary progression rounds. If new patient transition needs arise, please place a TOC consult. ? ? ?[place], [place] ?[phone] ? ?

## 2021-09-06 NOTE — Progress Notes (Signed)
?PROGRESS NOTE ? ? ? ?Brett Marshall  VOZ:[id number] DOB: 08-Mar-1966 DOA: 09/04/2021 ?PCP: [name], MD  ? ? ?Brief Narrative:  ?56 year old gentleman with history of nonobstructive coronary artery disease, nonischemic cardiomyopathy, type 2 diabetes on oral hypoglycemics, hypertension without sensory changes, obstructive sleep apnea not using CPAP, morbid obesity presented to the hospital with intermittent left precordial and midsternal chest pain. ? ? ?Assessment & Plan: ?  ?Chest pain with both typical and atypical features: ?Continues to have intermittent chest pain, however better than before.  Was on heparin that is discontinued.  Troponins are nonischemic.  EKG nonischemic.  2D echocardiogram with no regional wall motion abnormality.  Normal ejection fraction.  Underwent Lexiscan with abnormal studies.  Given high risk factors and abnormal stress test, scheduled for cardiac cath on 4/24. ?Patient will continue on aspirin, carvedilol, atorvastatin, Jardiance, enalapril.  Nitroglycerin for pain and morphine for unrelieved pain.  Followed by cardiology. ? ?Type 2 diabetes, well controlled on oral hypoglycemics: Can continue Jardiance and glipizide.  Hold on metformin.  Continue sliding scale insulin. ? ?Essential hypertension: As above.  Well-controlled. ? ?Sleep apnea: Not using CPAP.  I advised him to be compliant to CPAP use to improve his overall health and cardiovascular health. ? ?Morbid obesity: Will definitely benefit with weight loss and exercise, lifestyle changes.  Will need outpatient follow-up. ? ? ?DVT prophylaxis:   Lovenox subcu ? ? ?Code Status: Full code  ?Family Communication: None ?Disposition Plan: Status is: Observation ?The patient will require care spanning > 2 midnights and should be moved to inpatient because: Chest pain, abnormal stress test ?  ? ? ?Consultants:  ?Cardiology ? ?Procedures:  ?Stress test ? ?Antimicrobials:  ?None ? ? ?Subjective: ?Patient seen and examined in the  morning rounds.  Some reproducible chest pain mid sternum to right side of the sternum.  Denies any shortness of breath or palpitations. ? ?Objective: ?Vitals:  ? 09/06/21 0530 09/06/21 0738 09/06/21 1148 09/06/21 1602  ?BP: (!) 107/47 139/86 109/64 113/72  ?Pulse: 79 79 90 84  ?Resp: 18 16 16 20   ?Temp: 97.8 ?F (36.6 ?C) 97.7 ?F (36.5 ?C) 98 ?F (36.7 ?C) 97.9 ?F (36.6 ?C)  ?TempSrc: Oral Oral    ?SpO2: 100% 99% 97% 100%  ?Weight:      ?Height:      ? ? ?Intake/Output Summary (Last 24 hours) at 09/06/2021 1702 ?Last data filed at 09/06/2021 1026 ?Gross per 24 hour  ?Intake 360 ml  ?Output 475 ml  ?Net -115 ml  ? ?Filed Weights  ? 09/04/21 2323  ?Weight: (!) 149.2 kg  ? ? ?Examination: ? ?General exam: Appears calm and comfortable at rest.  Appropriately anxious. ?Respiratory system: Clear to auscultation. Respiratory effort normal. ?Cardiovascular system: S1 & S2 heard, RRR. No pedal edema. ?Gastrointestinal system: Abdomen is nondistended, soft and nontender. No organomegaly or masses felt. Normal bowel sounds heard.  Obese and pendulous. ?Central nervous system: Alert and oriented. No focal neurological deficits. ?Extremities: Symmetric 5 x 5 power. ?Skin: No rashes, lesions or ulcers ?Psychiatry: Judgement and insight appear normal. Mood & affect appropriate.  ? ? ? ?Data Reviewed: I have personally reviewed following labs and imaging studies ? ?CBC: ?Recent Labs  ?Lab 09/04/21 ?2355 09/05/21 ?1007 09/06/21 ?0401  ?WBC 10.0 7.8 8.3  ?HGB 13.1 13.1 12.9*  ?HCT 39.7 40.8 40.4  ?MCV 83.8 85.2 86.7  ?PLT 188 164 171  ? ?Basic Metabolic Panel: ?Recent Labs  ?Lab 09/04/21 ?2355 09/05/21 ?1007  ?NA  135 137  ?K 4.4 4.1  ?CL 100 102  ?CO2 26 28  ?GLUCOSE 151* 118*  ?BUN 21* 21*  ?CREATININE 1.22 1.06  ?CALCIUM 9.0 8.6*  ? ?GFR: ?Estimated Creatinine Clearance: 110.8 mL/min (by C-G formula based on SCr of 1.06 mg/dL). ?Liver Function Tests: ?No results for input(s): AST, ALT, ALKPHOS, BILITOT, PROT, ALBUMIN in the last 168  hours. ?No results for input(s): LIPASE, AMYLASE in the last 168 hours. ?No results for input(s): AMMONIA in the last 168 hours. ?Coagulation Profile: ?Recent Labs  ?Lab 09/05/21 ?0228  ?INR 1.1  ? ?Cardiac Enzymes: ?No results for input(s): CKTOTAL, CKMB, CKMBINDEX, TROPONINI in the last 168 hours. ?BNP (last 3 results) ?No results for input(s): PROBNP in the last 8760 hours. ?HbA1C: ?No results for input(s): HGBA1C in the last 72 hours. ?CBG: ?Recent Labs  ?Lab 09/05/21 ?1619 09/05/21 ?2001 09/06/21 ?0728 09/06/21 ?1124 09/06/21 ?1640  ?GLUCAP 125* 164* 126* 233* 98  ? ?Lipid Profile: ?No results for input(s): CHOL, HDL, LDLCALC, TRIG, CHOLHDL, LDLDIRECT in the last 72 hours. ?Thyroid Function Tests: ?No results for input(s): TSH, T4TOTAL, FREET4, T3FREE, THYROIDAB in the last 72 hours. ?Anemia Panel: ?No results for input(s): VITAMINB12, FOLATE, FERRITIN, TIBC, IRON, RETICCTPCT in the last 72 hours. ?Sepsis Labs: ?No results for input(s): PROCALCITON, LATICACIDVEN in the last 168 hours. ? ?No results found for this or any previous visit (from the past 240 hour(s)).  ? ? ? ? ? ?Radiology Studies: ?DG Chest 2 View ? ?Result Date: 09/04/2021 ?CLINICAL DATA:  Chest pain. EXAM: CHEST - 2 VIEW COMPARISON:  Chest radiograph dated 09/11/2020. FINDINGS: The heart size and mediastinal contours are within normal limits. Both lungs are clear. The visualized skeletal structures are unremarkable. IMPRESSION: No active cardiopulmonary disease. Electronically Signed   By: Anner Crete M.D.   On: 09/04/2021 23:49  ? ?NM Myocar Multi W/Spect W/Wall Motion / EF ? ?Result Date: 09/06/2021 ?  Findings are equivocal. The study is intermediate risk.   No ST deviation was noted.   LV perfusion is abnormal. Defect 1: There is a medium defect with moderate reduction in uptake present in the apical to mid anterior and anterolateral location(s) that is partially reversible. There is normal wall motion in the defect area.   Left  ventricular function is normal. End diastolic cavity size is normal. End systolic cavity size is normal.   Prior study not available for comparison. Body habitus, extracardiac activity affect both stress and rest images, but they appear to be worse on stress images. This was a 2 day study. There is a perfusion defect seen in the mid anterior to apical segments at rest. On stress imaging, there is a more pronounced perfusion defect in the mid to apical anterior and anterolateral wall. There is normal wall motion in this area. Overall cannot exclude ischemia; given image quality, cannot determine if ischemia vs. Artifact on stress imaging.  ? ?ECHOCARDIOGRAM COMPLETE ? ?Result Date: 09/05/2021 ?   ECHOCARDIOGRAM REPORT   Patient Name:   Brett Marshall [facility] Date of Exam: 09/05/2021 Medical Rec #:  [id number]       Height:       68.0 in Accession #:    [id number]      Weight:       329.0 lb Date of Birth:  1965-12-24       BSA:          2.525 m? Patient Age:    34 years  BP:           115/72 mmHg Patient Gender: M               HR:           75 bpm. Exam Location:  [facility] Procedure: 2D Echo, Cardiac Doppler and Color Doppler Indications:     Chest pain R07.9  History:         Patient has prior history of Echocardiogram examinations, most                  recent 01/19/2020. Non-ischemic cardiomopathy, CAD, Pulmonary                  HTN; Risk Factors:Diabetes and Hypertension.  Sonographer:     Sherrie Sport Referring Phys:  [id number] [place] Diagnosing Phys: Ida Rogue MD  Sonographer Comments: Suboptimal apical window and no subcostal window. IMPRESSIONS  1. Left ventricular ejection fraction, by estimation, is 60 to 65%. The left ventricle has normal function. The left ventricle has no regional wall motion abnormalities. Left ventricular diastolic parameters are consistent with Grade I diastolic dysfunction (impaired relaxation).  2. Right ventricular systolic function is normal. The right ventricular size is normal.  3.  The mitral valve is normal in structure. No evidence of mitral valve regurgitation. No evidence of mitral stenosis.  4. The aortic valve is normal in structure. Aortic valve regurgitation is not visualized. Aort

## 2021-09-06 NOTE — Progress Notes (Signed)
Mobility Specialist - Progress Note ? ? ? 09/06/21 1500  ?Mobility  ?Activity Ambulated independently in hallway;Stood at bedside;Dangled on edge of bed  ?Level of Assistance Independent  ?Assistive Device None  ?Distance Ambulated (ft) 350 ft  ?Activity Response Tolerated well  ?$Mobility charge 1 Mobility  ? ? ? ?During mobility: 106-111 HR, 98% SpO2 ? ?Pt semi supine upon arrival using RA. Pt completes bed mobility, STS and ambulation indep voicing no complaints and returns to bed with needs in reach and wife at bedside. ? ?Brett Marshall ?Mobility Specialist ?09/06/21, 3:03 PM ? ? ? ?

## 2021-09-06 NOTE — Plan of Care (Signed)
?  Problem: Cardiac: ?Goal: Ability to achieve and maintain adequate cardiopulmonary perfusion will improve ?Outcome: Progressing ?  ?Problem: Education: ?Goal: Knowledge of General Education information will improve ?Description: Including pain rating scale, medication(s)/side effects and non-pharmacologic comfort measures ?Outcome: Progressing ?  ?Problem: Health Behavior/Discharge Planning: ?Goal: Ability to manage health-related needs will improve ?Outcome: Progressing ?  ?Problem: Clinical Measurements: ?Goal: Ability to maintain clinical measurements within normal limits will improve ?Outcome: Progressing ?Goal: Will remain free from infection ?Outcome: Progressing ?Goal: Diagnostic test results will improve ?Outcome: Progressing ?Goal: Respiratory complications will improve ?Outcome: Progressing ?Goal: Cardiovascular complication will be avoided ?Outcome: Progressing ?  ?Problem: Activity: ?Goal: Risk for activity intolerance will decrease ?Outcome: Progressing ?  ?Problem: Nutrition: ?Goal: Adequate nutrition will be maintained ?Outcome: Progressing ?  ?Problem: Coping: ?Goal: Level of anxiety will decrease ?Outcome: Progressing ?  ?Problem: Elimination: ?Goal: Will not experience complications related to bowel motility ?Outcome: Progressing ?Goal: Will not experience complications related to urinary retention ?Outcome: Progressing ?  ?Problem: Pain Managment: ?Goal: General experience of comfort will improve ?Outcome: Progressing ?  ?Problem: Safety: ?Goal: Ability to remain free from injury will improve ?Outcome: Progressing ?  ?Problem: Skin Integrity: ?Goal: Risk for impaired skin integrity will decrease ?Outcome: Progressing ?  ?

## 2021-09-07 DIAGNOSIS — E119 Type 2 diabetes mellitus without complications: Secondary | ICD-10-CM | POA: Diagnosis not present

## 2021-09-07 DIAGNOSIS — R9439 Abnormal result of other cardiovascular function study: Secondary | ICD-10-CM | POA: Diagnosis not present

## 2021-09-07 DIAGNOSIS — I259 Chronic ischemic heart disease, unspecified: Secondary | ICD-10-CM | POA: Diagnosis not present

## 2021-09-07 DIAGNOSIS — I5022 Chronic systolic (congestive) heart failure: Secondary | ICD-10-CM | POA: Diagnosis not present

## 2021-09-07 DIAGNOSIS — I10 Essential (primary) hypertension: Secondary | ICD-10-CM | POA: Diagnosis not present

## 2021-09-07 LAB — HEMOGLOBIN A1C
Hgb A1c MFr Bld: 7.5 % — ABNORMAL HIGH (ref 4.8–5.6)
Mean Plasma Glucose: 168.55 mg/dL

## 2021-09-07 LAB — GLUCOSE, CAPILLARY
Glucose-Capillary: 101 mg/dL — ABNORMAL HIGH (ref 70–99)
Glucose-Capillary: 225 mg/dL — ABNORMAL HIGH (ref 70–99)
Glucose-Capillary: 159 mg/dL — ABNORMAL HIGH (ref 70–99)
Glucose-Capillary: 115 mg/dL — ABNORMAL HIGH (ref 70–99)

## 2021-09-07 LAB — LIPID PANEL
Cholesterol: 145 mg/dL (ref 0–200)
HDL: 43 mg/dL (ref >40)
LDL Cholesterol: 79 mg/dL (ref 0–99)
Total CHOL/HDL Ratio: 3.4 RATIO
Triglycerides: 113 mg/dL (ref <150)
VLDL: 23 mg/dL (ref 0–40)

## 2021-09-07 MED ORDER — SENNOSIDES-DOCUSATE SODIUM 8.6-50 MG PO TABS: 1.0000 | ORAL_TABLET | Freq: Every evening | ORAL | Status: DC | PRN

## 2021-09-07 MED ORDER — OXYCODONE HCL 5 MG PO TABS
5.0000 mg | ORAL_TABLET | ORAL | Status: DC | PRN
  Administered 2021-09-08 – 2021-09-09 (×2): 5 mg via ORAL
  Filled 2021-09-07 (×2): qty 1

## 2021-09-07 MED ORDER — METOPROLOL TARTRATE 5 MG/5ML IV SOLN: 5.0000 mg | INTRAVENOUS | Status: DC | PRN

## 2021-09-07 MED ORDER — TRAZODONE HCL 50 MG PO TABS: 50.0000 mg | ORAL_TABLET | Freq: Every evening | ORAL | Status: DC | PRN

## 2021-09-07 MED ORDER — HYDRALAZINE HCL 20 MG/ML IJ SOLN: 10.0000 mg | INTRAMUSCULAR | Status: DC | PRN

## 2021-09-07 MED ORDER — IPRATROPIUM-ALBUTEROL 0.5-2.5 (3) MG/3ML IN SOLN
3.0000 mL | RESPIRATORY_TRACT | Status: DC | PRN
Start: 2021-09-07 — End: 2021-09-09

## 2021-09-07 NOTE — H&P (View-Only) (Signed)
? ?Progress Note ? ?Patient Name: Brett Marshall ?Date of Encounter: 09/07/2021 ? ?[facility] Cardiologist: [name], MD  ? ?Subjective  ? ?Continues to have chest pain/tightness after about 2 laps around nurse's station. Not tender today. Reviewed stress test results. He wishes to proceed with cath tomorrow. ? ?Inpatient Medications  ?  ?Scheduled Meds: ? aspirin EC  81 mg Oral Daily  ? atorvastatin  10 mg Oral q1800  ? carvedilol  12.5 mg Oral BID  ? DULoxetine  60 mg Oral Daily  ? empagliflozin  25 mg Oral Daily  ? enalapril  10 mg Oral BID  ? enoxaparin (LOVENOX) injection  0.5 mg/kg Subcutaneous Q24H  ? famotidine  40 mg Oral Daily  ? glipiZIDE  5 mg Oral BID AC  ? insulin aspart  0-5 Units Subcutaneous QHS  ? insulin aspart  0-9 Units Subcutaneous TID WC  ? nystatin   Topical BID  ? ?Continuous Infusions: ? ?PRN Meds: ?acetaminophen **OR** acetaminophen, furosemide, hydrALAZINE, ipratropium-albuterol, magnesium hydroxide, metoprolol tartrate, nitroGLYCERIN, ondansetron **OR** ondansetron (ZOFRAN) IV, oxyCODONE, senna-docusate, traZODone  ? ?Vital Signs  ?  ?Vitals:  ? 09/06/21 2334 09/07/21 0512 09/07/21 0839 09/07/21 1127  ?BP: 108/77 111/74 128/89 98/71  ?Pulse: 78 76 81 88  ?Resp: 18 16 18 18   ?Temp: 98 ?F (36.7 ?C) 97.9 ?F (36.6 ?C) (!) 97.5 ?F (36.4 ?C) 97.8 ?F (36.6 ?C)  ?TempSrc: Oral Oral    ?SpO2: 99% 100% 100% 100%  ?Weight:      ?Height:      ? ? ?Intake/Output Summary (Last 24 hours) at 09/07/2021 1210 ?Last data filed at 09/07/2021 1010 ?Gross per 24 hour  ?Intake 620 ml  ?Output --  ?Net 620 ml  ? ? ?  09/04/2021  ? 11:23 PM 07/10/2021  ?  8:22 AM 12/31/2020  ?  2:28 PM  ?Last 3 Weights  ?Weight (lbs) 329 lb 328 lb 2 oz 326 lb 6 oz  ?Weight (kg) 149.233 kg 148.837 kg 148.043 kg  ?   ? ?Telemetry  ?  ?NSR - Personally Reviewed ? ?ECG  ?  ?No new - Personally Reviewed ? ?Physical Exam  ? ?GEN: No acute distress.   ?Neck: No JVD ?Cardiac: RRR, no murmurs, rubs, or gallops.  ?Respiratory: Clear  to auscultation bilaterally. ?GI: Soft, nontender, non-distended  ?MS: No edema; No deformity. ?Neuro:  Nonfocal  ?Psych: Normal affect  ? ?Labs  ?  ?High Sensitivity Troponin:   ?Recent Labs  ?Lab 09/04/21 ?2355 09/05/21 ?0116  ?TROPONINIHS 25* 28*  ?   ?Chemistry ?Recent Labs  ?Lab 09/04/21 ?2355 09/05/21 ?1007  ?NA 135 137  ?K 4.4 4.1  ?CL 100 102  ?CO2 26 28  ?GLUCOSE 151* 118*  ?BUN 21* 21*  ?CREATININE 1.22 1.06  ?CALCIUM 9.0 8.6*  ?GFRNONAA >60 >60  ?ANIONGAP 9 7  ?  ?Lipids  ?Recent Labs  ?Lab 09/07/21 ?9381  ?CHOL 145  ?TRIG 113  ?HDL 43  ?[place] 79  ?CHOLHDL 3.4  ?  ?Hematology ?Recent Labs  ?Lab 09/04/21 ?2355 09/05/21 ?1007 09/06/21 ?0401  ?WBC 10.0 7.8 8.3  ?RBC 4.74 4.79 4.66  ?HGB 13.1 13.1 12.9*  ?HCT 39.7 40.8 40.4  ?MCV 83.8 85.2 86.7  ?MCH 27.6 27.3 27.7  ?MCHC 33.0 32.1 31.9  ?RDW 14.6 14.6 14.6  ?PLT 188 164 171  ? ?Thyroid No results for input(s): TSH, FREET4 in the last 168 hours.  ?BNP ?Recent Labs  ?Lab 09/04/21 ?2355  ?BNP 44.3  ?  ?  DDimer  ?Recent Labs  ?Lab 09/04/21 ?2355  ?DDIMER 0.49  ?  ? ?Radiology  ?  ?NM Myocar Multi W/Spect W/Wall Motion / EF ? ?Result Date: 09/06/2021 ?  Findings are equivocal. The study is intermediate risk.   No ST deviation was noted.   LV perfusion is abnormal. Defect 1: There is a medium defect with moderate reduction in uptake present in the apical to mid anterior and anterolateral location(s) that is partially reversible. There is normal wall motion in the defect area.   Left ventricular function is normal. End diastolic cavity size is normal. End systolic cavity size is normal.   Prior study not available for comparison. Body habitus, extracardiac activity affect both stress and rest images, but they appear to be worse on stress images. This was a 2 day study. There is a perfusion defect seen in the mid anterior to apical segments at rest. On stress imaging, there is a more pronounced perfusion defect in the mid to apical anterior and anterolateral wall.  There is normal wall motion in this area. Overall cannot exclude ischemia; given image quality, cannot determine if ischemia vs. Artifact on stress imaging.   ? ?Cardiac Studies  ? ?Nuclear stress test 09/06/21 ?  Findings are equivocal. The study is intermediate risk. ?  No ST deviation was noted. ?  LV perfusion is abnormal. Defect 1: There is a medium defect with moderate reduction in uptake present in the apical to mid anterior and anterolateral location(s) that is partially reversible. There is normal wall motion in the defect area. ?  Left ventricular function is normal. End diastolic cavity size is normal. End systolic cavity size is normal. ?  Prior study not available for comparison. ?  ?Body habitus, extracardiac activity affect both stress and rest images, but they appear to be worse on stress images. This was a 2 day study. There is a perfusion defect seen in the mid anterior to apical segments at rest. On stress imaging, there is a more pronounced perfusion defect in the mid to apical anterior and anterolateral wall. There is normal wall motion in this area. Overall cannot exclude ischemia; given image quality, cannot determine if ischemia vs. Artifact on stress imaging. ?  ?2D echo 09/05/2021: ?1. Left ventricular ejection fraction, by estimation, is 60 to 65%. The  ?left ventricle has normal function. The left ventricle has no regional  ?wall motion abnormalities. Left ventricular diastolic parameters are  ?consistent with Grade I diastolic  ?dysfunction (impaired relaxation).  ? 2. Right ventricular systolic function is normal. The right ventricular  ?size is normal.  ? 3. The mitral valve is normal in structure. No evidence of mitral valve  ?regurgitation. No evidence of mitral stenosis.  ? 4. The aortic valve is normal in structure. Aortic valve regurgitation is  ?not visualized. Aortic valve sclerosis is present, with no evidence of  ?aortic valve stenosis.  ? 5. The inferior vena cava is normal in  size with greater than 50%  ?respiratory variability, suggesting right atrial pressure of 3 mmHg.  ? 6. Trivial pericardial effusion is present. ? ?Patient Profile  ?   ?56 y.o. male with history of nonobstructive CAD by LHC in 2019, NICM, DM 2, HTN with orthostatic dizziness, morbid obesity, OSA on CPAP, and degenerative disc disease who is being seen today for the evaluation of chest pain at the request of Dr. Sidney Ace. ? ?Assessment & Plan  ?  ?Chest pain: atypical. Flat and only trivially elevated troponins. Tender  on palpation across chest. However, has significant risk factors.  ?-stress imaging difficult to interpret. 2 day study, quality of rest images better than stress. Cannot determine if abnormal perfusion is artifact or ischemia based on this. ?-echo reassuring. ?-we discussed options at length. Given continued chest pain, he would like definitive evaluation. Wishes to proceed with cath tomorrow. ? ?Shared Decision Making/Informed Consent ?The risks [stroke (1 in 1000), death (1 in 48), kidney failure [usually temporary] (1 in 500), bleeding (1 in 200), allergic reaction [possibly serious] (1 in 200)], benefits (diagnostic support and management of coronary artery disease) and alternatives of a cardiac catheterization were discussed in detail with Mr. Rising and he is willing to proceed.  ? ?CAD involving the native coronary arteries ?Hyperlipidemia ?Type II diabetes ?-nonobstructive on prior cath ?-continue atorvastatin ?-continue aspirin ?-continue empagliflozin for diabetes and CV protection ?  ?Recovered EF, history of nonischemic cardiomyopathy ?-PTA carvedilol and enalapril ?-Echo with preserved LVEF as outlined above ?  ?HTN:  ?-Blood pressure well controlled to borderline low ?-Continue current medical therapy ? ?For questions or updates, please contact [place] ?Please consult [email or web address] for contact info under  ? ?  ?   ?Signed, ?Buford Dresser, MD  ?09/07/2021, 12:10 PM    ?

## 2021-09-07 NOTE — Progress Notes (Signed)
? ?Progress Note ? ?Patient Name: Brett Marshall ?Date of Encounter: 09/07/2021 ? ?[facility] Cardiologist: [name], MD  ? ?Subjective  ? ?Continues to have chest pain/tightness after about 2 laps around nurse's station. Not tender today. Reviewed stress test results. He wishes to proceed with cath tomorrow. ? ?Inpatient Medications  ?  ?Scheduled Meds: ? aspirin EC  81 mg Oral Daily  ? atorvastatin  10 mg Oral q1800  ? carvedilol  12.5 mg Oral BID  ? DULoxetine  60 mg Oral Daily  ? empagliflozin  25 mg Oral Daily  ? enalapril  10 mg Oral BID  ? enoxaparin (LOVENOX) injection  0.5 mg/kg Subcutaneous Q24H  ? famotidine  40 mg Oral Daily  ? glipiZIDE  5 mg Oral BID AC  ? insulin aspart  0-5 Units Subcutaneous QHS  ? insulin aspart  0-9 Units Subcutaneous TID WC  ? nystatin   Topical BID  ? ?Continuous Infusions: ? ?PRN Meds: ?acetaminophen **OR** acetaminophen, furosemide, hydrALAZINE, ipratropium-albuterol, magnesium hydroxide, metoprolol tartrate, nitroGLYCERIN, ondansetron **OR** ondansetron (ZOFRAN) IV, oxyCODONE, senna-docusate, traZODone  ? ?Vital Signs  ?  ?Vitals:  ? 09/06/21 2334 09/07/21 0512 09/07/21 0839 09/07/21 1127  ?BP: 108/77 111/74 128/89 98/71  ?Pulse: 78 76 81 88  ?Resp: 18 16 18 18   ?Temp: 98 ?F (36.7 ?C) 97.9 ?F (36.6 ?C) (!) 97.5 ?F (36.4 ?C) 97.8 ?F (36.6 ?C)  ?TempSrc: Oral Oral    ?SpO2: 99% 100% 100% 100%  ?Weight:      ?Height:      ? ? ?Intake/Output Summary (Last 24 hours) at 09/07/2021 1210 ?Last data filed at 09/07/2021 1010 ?Gross per 24 hour  ?Intake 620 ml  ?Output --  ?Net 620 ml  ? ? ?  09/04/2021  ? 11:23 PM 07/10/2021  ?  8:22 AM 12/31/2020  ?  2:28 PM  ?Last 3 Weights  ?Weight (lbs) 329 lb 328 lb 2 oz 326 lb 6 oz  ?Weight (kg) 149.233 kg 148.837 kg 148.043 kg  ?   ? ?Telemetry  ?  ?NSR - Personally Reviewed ? ?ECG  ?  ?No new - Personally Reviewed ? ?Physical Exam  ? ?GEN: No acute distress.   ?Neck: No JVD ?Cardiac: RRR, no murmurs, rubs, or gallops.  ?Respiratory: Clear  to auscultation bilaterally. ?GI: Soft, nontender, non-distended  ?MS: No edema; No deformity. ?Neuro:  Nonfocal  ?Psych: Normal affect  ? ?Labs  ?  ?High Sensitivity Troponin:   ?Recent Labs  ?Lab 09/04/21 ?2355 09/05/21 ?0116  ?TROPONINIHS 25* 28*  ?   ?Chemistry ?Recent Labs  ?Lab 09/04/21 ?2355 09/05/21 ?1007  ?NA 135 137  ?K 4.4 4.1  ?CL 100 102  ?CO2 26 28  ?GLUCOSE 151* 118*  ?BUN 21* 21*  ?CREATININE 1.22 1.06  ?CALCIUM 9.0 8.6*  ?GFRNONAA >60 >60  ?ANIONGAP 9 7  ?  ?Lipids  ?Recent Labs  ?Lab 09/07/21 ?7619  ?CHOL 145  ?TRIG 113  ?HDL 43  ?[place] 79  ?CHOLHDL 3.4  ?  ?Hematology ?Recent Labs  ?Lab 09/04/21 ?2355 09/05/21 ?1007 09/06/21 ?0401  ?WBC 10.0 7.8 8.3  ?RBC 4.74 4.79 4.66  ?HGB 13.1 13.1 12.9*  ?HCT 39.7 40.8 40.4  ?MCV 83.8 85.2 86.7  ?MCH 27.6 27.3 27.7  ?MCHC 33.0 32.1 31.9  ?RDW 14.6 14.6 14.6  ?PLT 188 164 171  ? ?Thyroid No results for input(s): TSH, FREET4 in the last 168 hours.  ?BNP ?Recent Labs  ?Lab 09/04/21 ?2355  ?BNP 44.3  ?  ?  DDimer  ?Recent Labs  ?Lab 09/04/21 ?2355  ?DDIMER 0.49  ?  ? ?Radiology  ?  ?NM Myocar Multi W/Spect W/Wall Motion / EF ? ?Result Date: 09/06/2021 ?  Findings are equivocal. The study is intermediate risk.   No ST deviation was noted.   LV perfusion is abnormal. Defect 1: There is a medium defect with moderate reduction in uptake present in the apical to mid anterior and anterolateral location(s) that is partially reversible. There is normal wall motion in the defect area.   Left ventricular function is normal. End diastolic cavity size is normal. End systolic cavity size is normal.   Prior study not available for comparison. Body habitus, extracardiac activity affect both stress and rest images, but they appear to be worse on stress images. This was a 2 day study. There is a perfusion defect seen in the mid anterior to apical segments at rest. On stress imaging, there is a more pronounced perfusion defect in the mid to apical anterior and anterolateral wall.  There is normal wall motion in this area. Overall cannot exclude ischemia; given image quality, cannot determine if ischemia vs. Artifact on stress imaging.   ? ?Cardiac Studies  ? ?Nuclear stress test 09/06/21 ?  Findings are equivocal. The study is intermediate risk. ?  No ST deviation was noted. ?  LV perfusion is abnormal. Defect 1: There is a medium defect with moderate reduction in uptake present in the apical to mid anterior and anterolateral location(s) that is partially reversible. There is normal wall motion in the defect area. ?  Left ventricular function is normal. End diastolic cavity size is normal. End systolic cavity size is normal. ?  Prior study not available for comparison. ?  ?Body habitus, extracardiac activity affect both stress and rest images, but they appear to be worse on stress images. This was a 2 day study. There is a perfusion defect seen in the mid anterior to apical segments at rest. On stress imaging, there is a more pronounced perfusion defect in the mid to apical anterior and anterolateral wall. There is normal wall motion in this area. Overall cannot exclude ischemia; given image quality, cannot determine if ischemia vs. Artifact on stress imaging. ?  ?2D echo 09/05/2021: ?1. Left ventricular ejection fraction, by estimation, is 60 to 65%. The  ?left ventricle has normal function. The left ventricle has no regional  ?wall motion abnormalities. Left ventricular diastolic parameters are  ?consistent with Grade I diastolic  ?dysfunction (impaired relaxation).  ? 2. Right ventricular systolic function is normal. The right ventricular  ?size is normal.  ? 3. The mitral valve is normal in structure. No evidence of mitral valve  ?regurgitation. No evidence of mitral stenosis.  ? 4. The aortic valve is normal in structure. Aortic valve regurgitation is  ?not visualized. Aortic valve sclerosis is present, with no evidence of  ?aortic valve stenosis.  ? 5. The inferior vena cava is normal in  size with greater than 50%  ?respiratory variability, suggesting right atrial pressure of 3 mmHg.  ? 6. Trivial pericardial effusion is present. ? ?Patient Profile  ?   ?56 y.o. male with history of nonobstructive CAD by LHC in 2019, NICM, DM 2, HTN with orthostatic dizziness, morbid obesity, OSA on CPAP, and degenerative disc disease who is being seen today for the evaluation of chest pain at the request of Dr. Sidney Ace. ? ?Assessment & Plan  ?  ?Chest pain: atypical. Flat and only trivially elevated troponins. Tender  on palpation across chest. However, has significant risk factors.  ?-stress imaging difficult to interpret. 2 day study, quality of rest images better than stress. Cannot determine if abnormal perfusion is artifact or ischemia based on this. ?-echo reassuring. ?-we discussed options at length. Given continued chest pain, he would like definitive evaluation. Wishes to proceed with cath tomorrow. ? ?Shared Decision Making/Informed Consent ?The risks [stroke (1 in 1000), death (1 in 51), kidney failure [usually temporary] (1 in 500), bleeding (1 in 200), allergic reaction [possibly serious] (1 in 200)], benefits (diagnostic support and management of coronary artery disease) and alternatives of a cardiac catheterization were discussed in detail with Brett Marshall and he is willing to proceed.  ? ?CAD involving the native coronary arteries ?Hyperlipidemia ?Type II diabetes ?-nonobstructive on prior cath ?-continue atorvastatin ?-continue aspirin ?-continue empagliflozin for diabetes and CV protection ?  ?Recovered EF, history of nonischemic cardiomyopathy ?-PTA carvedilol and enalapril ?-Echo with preserved LVEF as outlined above ?  ?HTN:  ?-Blood pressure well controlled to borderline low ?-Continue current medical therapy ? ?For questions or updates, please contact [place] ?Please consult [email or web address] for contact info under  ? ?  ?   ?Signed, ?Buford Dresser, MD  ?09/07/2021, 12:10 PM    ?

## 2021-09-07 NOTE — Progress Notes (Signed)
Mobility Specialist - Progress Note ? ? ? 09/07/21 1400  ?Mobility  ?Activity Ambulated independently in hallway;Stood at bedside;Dangled on edge of bed  ?Level of Assistance Independent  ?Assistive Device None  ?Distance Ambulated (ft) 320 ft  ?Activity Response Tolerated well  ?$Mobility charge 1 Mobility  ? ? ?During mobility: 111 HR, 97% SpO2 ? ?Pt supine upon arrival using RA. Completes bed mobility ModI + extra time and STS indep. Pt ambulates 369f indep voicing mild "chest tightness." Left EOB with needs in reach and family at bedside. ? ?Brett Marshall?Mobility Specialist ?09/07/21, 2:38 PM ? ? ? ? ?

## 2021-09-07 NOTE — Progress Notes (Signed)
?PROGRESS NOTE ? ? ? ?Brett Marshall  FYB:[id number] DOB: September 04, 1965 DOA: 09/04/2021 ?PCP: [name], MD  ? ?Brief Narrative:  ?56 year old gentleman with history of nonobstructive coronary artery disease, nonischemic cardiomyopathy, type 2 diabetes on oral hypoglycemics, hypertension without sensory changes, obstructive sleep apnea not using CPAP, morbid obesity presented to the hospital with intermittent left precordial and midsternal chest pain.  Echocardiogram did not show wall motion abnormality, Lexiscan showed abnormal studies therefore cardiology has scheduled patient for left heart cath on 4/24. ? ? ?Assessment & Plan: ? Principal Problem: ?  Chest pain ?Active Problems: ?  Chronic systolic congestive heart failure ([place]) ?  Controlled type 2 diabetes mellitus without complication, without long-term current use of insulin ([place]) ?  Essential hypertension ?  ?Typical chest pain ?History of nonobstructive CAD ?- Troponins and nonischemic, EKG does not show acute ST-T changes.  Echocardiogram did not show any wall motion abnormality, preserved ejection fraction.  Lexiscan showed abnormal studies showing perfusion defect therefore scheduled for left heart cath on 4/24. ?- Continue aspirin, statin, Coreg, enalapril and Jardiance. ?- Nitro and morphine for pain ?-We will check A1c and lipid panel ? ?Type 2 diabetes ?-Currently on glipizide and Jardiance which should be help if confirmed by cardiology his LHC will be tomorrow and is NPO PMN.  ?- Sliding scale and Accu-Cheks. ?- Continue to hold metformin ? ?Essential hypertension ?-Continue Coreg, enalapril ?  ?Sleep apnea ?- Noncompliant with his CPAP ? ?Depression ?- Continue Cymbalta ?  ?Morbid obesity: Will definitely benefit with weight loss and exercise, lifestyle changes.  Will need outpatient follow-up. ?  ? ? ? ?DVT prophylaxis: Lovenox ?Code Status: Full ?Family Communication:   ? ?Maintain hospital stay for cardiac catheterization  tomorrow ? ? ? ? ? ? ?Subjective: ?Patient states he had mild chest discomfort this morning but during my visit had subsided.  No other complaints. ? ? ?Examination: ? ?General exam: Appears calm and comfortable  ?Respiratory system: Clear to auscultation. Respiratory effort normal. ?Cardiovascular system: S1 & S2 heard, RRR. No JVD, murmurs, rubs, gallops or clicks. No pedal edema. ?Gastrointestinal system: Abdomen is nondistended, soft and nontender. No organomegaly or masses felt. Normal bowel sounds heard. ?Central nervous system: Alert and oriented. No focal neurological deficits. ?Extremities: Symmetric 5 x 5 power. ?Skin: No rashes, lesions or ulcers ?Psychiatry: Judgement and insight appear normal. Mood & affect appropriate.  ? ? ? ?Objective: ?Vitals:  ? 09/06/21 1602 09/06/21 1936 09/06/21 2334 09/07/21 0512  ?BP: 113/72 114/79 108/77 111/74  ?Pulse: 84 94 78 76  ?Resp: 20 20 18 16   ?Temp: 97.9 ?F (36.6 ?C) 98.1 ?F (36.7 ?C) 98 ?F (36.7 ?C) 97.9 ?F (36.6 ?C)  ?TempSrc:   Oral Oral  ?SpO2: 100% 98% 99% 100%  ?Weight:      ?Height:      ? ? ?Intake/Output Summary (Last 24 hours) at 09/07/2021 0825 ?Last data filed at 09/06/2021 1824 ?Gross per 24 hour  ?Intake 620 ml  ?Output --  ?Net 620 ml  ? ?Filed Weights  ? 09/04/21 2323  ?Weight: (!) 149.2 kg  ? ? ? ?Data Reviewed:  ? ?CBC: ?Recent Labs  ?Lab 09/04/21 ?2355 09/05/21 ?1007 09/06/21 ?0401  ?WBC 10.0 7.8 8.3  ?HGB 13.1 13.1 12.9*  ?HCT 39.7 40.8 40.4  ?MCV 83.8 85.2 86.7  ?PLT 188 164 171  ? ?Basic Metabolic Panel: ?Recent Labs  ?Lab 09/04/21 ?2355 09/05/21 ?1007  ?NA 135 137  ?K 4.4 4.1  ?CL 100 102  ?  CO2 26 28  ?GLUCOSE 151* 118*  ?BUN 21* 21*  ?CREATININE 1.22 1.06  ?CALCIUM 9.0 8.6*  ? ?GFR: ?Estimated Creatinine Clearance: 110.8 mL/min (by C-G formula based on SCr of 1.06 mg/dL). ?Liver Function Tests: ?No results for input(s): AST, ALT, ALKPHOS, BILITOT, PROT, ALBUMIN in the last 168 hours. ?No results for input(s): LIPASE, AMYLASE in the last 168  hours. ?No results for input(s): AMMONIA in the last 168 hours. ?Coagulation Profile: ?Recent Labs  ?Lab 09/05/21 ?0228  ?INR 1.1  ? ?Cardiac Enzymes: ?No results for input(s): CKTOTAL, CKMB, CKMBINDEX, TROPONINI in the last 168 hours. ?BNP (last 3 results) ?No results for input(s): PROBNP in the last 8760 hours. ?HbA1C: ?No results for input(s): HGBA1C in the last 72 hours. ?CBG: ?Recent Labs  ?Lab 09/05/21 ?2001 09/06/21 ?0728 09/06/21 ?1124 09/06/21 ?1640 09/06/21 ?2039  ?GLUCAP 164* 126* 233* 98 175*  ? ?Lipid Profile: ?No results for input(s): CHOL, HDL, LDLCALC, TRIG, CHOLHDL, LDLDIRECT in the last 72 hours. ?Thyroid Function Tests: ?No results for input(s): TSH, T4TOTAL, FREET4, T3FREE, THYROIDAB in the last 72 hours. ?Anemia Panel: ?No results for input(s): VITAMINB12, FOLATE, FERRITIN, TIBC, IRON, RETICCTPCT in the last 72 hours. ?Sepsis Labs: ?No results for input(s): PROCALCITON, LATICACIDVEN in the last 168 hours. ? ?No results found for this or any previous visit (from the past 240 hour(s)).  ? ? ? ? ? ?Radiology Studies: ?NM Myocar Multi W/Spect W/Wall Motion / EF ? ?Result Date: 09/06/2021 ?  Findings are equivocal. The study is intermediate risk.   No ST deviation was noted.   LV perfusion is abnormal. Defect 1: There is a medium defect with moderate reduction in uptake present in the apical to mid anterior and anterolateral location(s) that is partially reversible. There is normal wall motion in the defect area.   Left ventricular function is normal. End diastolic cavity size is normal. End systolic cavity size is normal.   Prior study not available for comparison. Body habitus, extracardiac activity affect both stress and rest images, but they appear to be worse on stress images. This was a 2 day study. There is a perfusion defect seen in the mid anterior to apical segments at rest. On stress imaging, there is a more pronounced perfusion defect in the mid to apical anterior and anterolateral wall.  There is normal wall motion in this area. Overall cannot exclude ischemia; given image quality, cannot determine if ischemia vs. Artifact on stress imaging.  ? ?ECHOCARDIOGRAM COMPLETE ? ?Result Date: 09/05/2021 ?   ECHOCARDIOGRAM REPORT   Patient Name:   DAN SCEARCE [facility] Date of Exam: 09/05/2021 Medical Rec #:  [id number]       Height:       68.0 in Accession #:    [id number]      Weight:       329.0 lb Date of Birth:  June 23, 1965       BSA:          2.525 m? Patient Age:    1 years        BP:           115/72 mmHg Patient Gender: M               HR:           75 bpm. Exam Location:  [facility] Procedure: 2D Echo, Cardiac Doppler and Color Doppler Indications:     Chest pain R07.9  History:         Patient has prior  history of Echocardiogram examinations, most                  recent 01/19/2020. Non-ischemic cardiomopathy, CAD, Pulmonary                  HTN; Risk Factors:Diabetes and Hypertension.  Sonographer:     Sherrie Sport Referring Phys:  [id number] [place] Diagnosing Phys: Ida Rogue MD  Sonographer Comments: Suboptimal apical window and no subcostal window. IMPRESSIONS  1. Left ventricular ejection fraction, by estimation, is 60 to 65%. The left ventricle has normal function. The left ventricle has no regional wall motion abnormalities. Left ventricular diastolic parameters are consistent with Grade I diastolic dysfunction (impaired relaxation).  2. Right ventricular systolic function is normal. The right ventricular size is normal.  3. The mitral valve is normal in structure. No evidence of mitral valve regurgitation. No evidence of mitral stenosis.  4. The aortic valve is normal in structure. Aortic valve regurgitation is not visualized. Aortic valve sclerosis is present, with no evidence of aortic valve stenosis.  5. The inferior vena cava is normal in size with greater than 50% respiratory variability, suggesting right atrial pressure of 3 mmHg.  6. Trivial pericardial effusion is present. FINDINGS  Left  Ventricle: Left ventricular ejection fraction, by estimation, is 60 to 65%. The left ventricle has normal function. The left ventricle has no regional wall motion abnormalities. The left ventricular internal cavity size was normal

## 2021-09-08 ENCOUNTER — Other Ambulatory Visit (HOSPITAL_COMMUNITY): Payer: Self-pay

## 2021-09-08 ENCOUNTER — Encounter
Admission: EM | Disposition: A | Discharge status: Home / Self Care | Payer: Self-pay | Source: Home / Self Care | Attending: Internal Medicine

## 2021-09-08 ENCOUNTER — Encounter: Payer: Self-pay | Admitting: Cardiovascular Disease

## 2021-09-08 DIAGNOSIS — I110 Hypertensive heart disease with heart failure: Secondary | ICD-10-CM | POA: Diagnosis present

## 2021-09-08 DIAGNOSIS — I200 Unstable angina: Secondary | ICD-10-CM | POA: Diagnosis not present

## 2021-09-08 DIAGNOSIS — Z825 Family history of asthma and other chronic lower respiratory diseases: Secondary | ICD-10-CM | POA: Diagnosis not present

## 2021-09-08 DIAGNOSIS — R233 Spontaneous ecchymoses: Secondary | ICD-10-CM | POA: Diagnosis not present

## 2021-09-08 DIAGNOSIS — I428 Other cardiomyopathies: Secondary | ICD-10-CM | POA: Diagnosis present

## 2021-09-08 DIAGNOSIS — Z8249 Family history of ischemic heart disease and other diseases of the circulatory system: Secondary | ICD-10-CM | POA: Diagnosis not present

## 2021-09-08 DIAGNOSIS — Z833 Family history of diabetes mellitus: Secondary | ICD-10-CM | POA: Diagnosis not present

## 2021-09-08 DIAGNOSIS — F32A Depression, unspecified: Secondary | ICD-10-CM | POA: Diagnosis present

## 2021-09-08 DIAGNOSIS — G4733 Obstructive sleep apnea (adult) (pediatric): Secondary | ICD-10-CM | POA: Diagnosis present

## 2021-09-08 DIAGNOSIS — R0789 Other chest pain: Secondary | ICD-10-CM | POA: Diagnosis present

## 2021-09-08 DIAGNOSIS — Z6841 Body Mass Index (BMI) 40.0 and over, adult: Secondary | ICD-10-CM | POA: Diagnosis not present

## 2021-09-08 DIAGNOSIS — E785 Hyperlipidemia, unspecified: Secondary | ICD-10-CM | POA: Diagnosis present

## 2021-09-08 DIAGNOSIS — E119 Type 2 diabetes mellitus without complications: Secondary | ICD-10-CM | POA: Diagnosis present

## 2021-09-08 DIAGNOSIS — Z79899 Other long term (current) drug therapy: Secondary | ICD-10-CM | POA: Diagnosis not present

## 2021-09-08 DIAGNOSIS — I5022 Chronic systolic (congestive) heart failure: Secondary | ICD-10-CM | POA: Diagnosis present

## 2021-09-08 DIAGNOSIS — I25110 Atherosclerotic heart disease of native coronary artery with unstable angina pectoris: Secondary | ICD-10-CM | POA: Diagnosis present

## 2021-09-08 DIAGNOSIS — J45909 Unspecified asthma, uncomplicated: Secondary | ICD-10-CM | POA: Diagnosis present

## 2021-09-08 DIAGNOSIS — I10 Essential (primary) hypertension: Secondary | ICD-10-CM | POA: Diagnosis not present

## 2021-09-08 DIAGNOSIS — Z91199 Patient's noncompliance with other medical treatment and regimen due to unspecified reason: Secondary | ICD-10-CM | POA: Diagnosis not present

## 2021-09-08 DIAGNOSIS — I259 Chronic ischemic heart disease, unspecified: Secondary | ICD-10-CM | POA: Diagnosis not present

## 2021-09-08 DIAGNOSIS — Z7984 Long term (current) use of oral hypoglycemic drugs: Secondary | ICD-10-CM | POA: Diagnosis not present

## 2021-09-08 DIAGNOSIS — I272 Pulmonary hypertension, unspecified: Secondary | ICD-10-CM | POA: Diagnosis present

## 2021-09-08 HISTORY — PX: INTRAVASCULAR ULTRASOUND/IVUS: CATH118244

## 2021-09-08 HISTORY — PX: CORONARY STENT INTERVENTION: CATH118234

## 2021-09-08 HISTORY — PX: LEFT HEART CATH AND CORONARY ANGIOGRAPHY: CATH118249

## 2021-09-08 LAB — CBC
HCT: 41.8 % (ref 39.0–52.0)
Hemoglobin: 13.2 g/dL (ref 13.0–17.0)
MCH: 27.1 pg (ref 26.0–34.0)
MCHC: 31.6 g/dL (ref 30.0–36.0)
MCV: 85.8 fL (ref 80.0–100.0)
Platelets: 187 10*3/uL (ref 150–400)
RBC: 4.87 MIL/uL (ref 4.22–5.81)
RDW: 14.6 % (ref 11.5–15.5)
WBC: 8.6 10*3/uL (ref 4.0–10.5)
nRBC: 0 % (ref 0.0–0.2)

## 2021-09-08 LAB — BASIC METABOLIC PANEL
Anion gap: 6 (ref 5–15)
BUN: 19 mg/dL (ref 6–20)
CO2: 28 mmol/L (ref 22–32)
Calcium: 8.9 mg/dL (ref 8.9–10.3)
Chloride: 104 mmol/L (ref 98–111)
Creatinine, Ser: 1.17 mg/dL (ref 0.61–1.24)
GFR, Estimated: >60 mL/min (ref >60)
Glucose, Bld: 157 mg/dL — ABNORMAL HIGH (ref 70–99)
Potassium: 4.4 mmol/L (ref 3.5–5.1)
Sodium: 138 mmol/L (ref 135–145)

## 2021-09-08 LAB — MAGNESIUM: Magnesium: 2.2 mg/dL (ref 1.7–2.4)

## 2021-09-08 LAB — POCT ACTIVATED CLOTTING TIME
Activated Clotting Time: 281 seconds
Activated Clotting Time: 353 seconds

## 2021-09-08 LAB — GLUCOSE, CAPILLARY
Glucose-Capillary: 154 mg/dL — ABNORMAL HIGH (ref 70–99)
Glucose-Capillary: 184 mg/dL — ABNORMAL HIGH (ref 70–99)
Glucose-Capillary: 192 mg/dL — ABNORMAL HIGH (ref 70–99)

## 2021-09-08 SURGERY — LEFT HEART CATH AND CORONARY ANGIOGRAPHY
Anesthesia: Moderate Sedation

## 2021-09-08 MED ORDER — SODIUM CHLORIDE 0.9 % IV SOLN: INTRAVENOUS | Status: DC

## 2021-09-08 MED ORDER — FENTANYL CITRATE (PF) 100 MCG/2ML IJ SOLN
INTRAMUSCULAR | Status: AC
  Filled 2021-09-08: qty 2

## 2021-09-08 MED ORDER — SODIUM CHLORIDE 0.9% FLUSH: 3.0000 mL | INTRAVENOUS | Status: DC | PRN

## 2021-09-08 MED ORDER — VERAPAMIL HCL 2.5 MG/ML IV SOLN
INTRAVENOUS | Status: DC | PRN
  Administered 2021-09-08: 2.5 mg via INTRA_ARTERIAL

## 2021-09-08 MED ORDER — HEPARIN SODIUM (PORCINE) 1000 UNIT/ML IJ SOLN
INTRAMUSCULAR | Status: DC | PRN
  Administered 2021-09-08: 6000 [IU] via INTRAVENOUS
  Administered 2021-09-08: 7000 [IU] via INTRAVENOUS

## 2021-09-08 MED ORDER — LIDOCAINE HCL (PF) 1 % IJ SOLN
INTRAMUSCULAR | Status: DC | PRN
  Administered 2021-09-08: 2 mL

## 2021-09-08 MED ORDER — FENTANYL CITRATE (PF) 100 MCG/2ML IJ SOLN
INTRAMUSCULAR | Status: DC | PRN
  Administered 2021-09-08: 50 ug via INTRAVENOUS
  Administered 2021-09-08 (×4): 25 ug via INTRAVENOUS

## 2021-09-08 MED ORDER — SODIUM CHLORIDE 0.9% FLUSH: 3.0000 mL | Freq: Two times a day (BID) | INTRAVENOUS | Status: DC

## 2021-09-08 MED ORDER — TICAGRELOR 90 MG PO TABS
ORAL_TABLET | ORAL | Status: AC
  Filled 2021-09-08: qty 2

## 2021-09-08 MED ORDER — IOHEXOL 300 MG/ML  SOLN
INTRAMUSCULAR | Status: DC | PRN
  Administered 2021-09-08: 150 mL

## 2021-09-08 MED ORDER — SODIUM CHLORIDE 0.9 % IV SOLN: INTRAVENOUS | Status: AC

## 2021-09-08 MED ORDER — ASPIRIN 81 MG PO CHEW
CHEWABLE_TABLET | ORAL | Status: AC
  Filled 2021-09-08: qty 1

## 2021-09-08 MED ORDER — ASPIRIN 81 MG PO CHEW
81.0000 mg | CHEWABLE_TABLET | ORAL | Status: AC
Start: 2021-09-09 — End: 2021-09-08
  Administered 2021-09-08: 81 mg via ORAL

## 2021-09-08 MED ORDER — VERAPAMIL HCL 2.5 MG/ML IV SOLN
INTRAVENOUS | Status: AC
  Filled 2021-09-08: qty 2

## 2021-09-08 MED ORDER — ATORVASTATIN CALCIUM 20 MG PO TABS
40.0000 mg | ORAL_TABLET | Freq: Every day | ORAL | Status: DC
Start: 2021-09-08 — End: 2021-09-09
  Administered 2021-09-08: 40 mg via ORAL
  Filled 2021-09-08: qty 2

## 2021-09-08 MED ORDER — SODIUM CHLORIDE 0.9% FLUSH
3.0000 mL | Freq: Two times a day (BID) | INTRAVENOUS | Status: DC
  Administered 2021-09-08 (×2): 3 mL via INTRAVENOUS

## 2021-09-08 MED ORDER — MIDAZOLAM HCL 2 MG/2ML IJ SOLN
INTRAMUSCULAR | Status: DC | PRN
Start: 2021-09-08 — End: 2021-09-08
  Administered 2021-09-08 (×2): 1 mg via INTRAVENOUS

## 2021-09-08 MED ORDER — SODIUM CHLORIDE 0.9 % IV SOLN: 250.0000 mL | INTRAVENOUS | Status: DC | PRN

## 2021-09-08 MED ORDER — HEPARIN SODIUM (PORCINE) 1000 UNIT/ML IJ SOLN
INTRAMUSCULAR | Status: AC
  Filled 2021-09-08: qty 10

## 2021-09-08 MED ORDER — HEPARIN (PORCINE) IN NACL 1000-0.9 UT/500ML-% IV SOLN
INTRAVENOUS | Status: AC
  Filled 2021-09-08: qty 1000

## 2021-09-08 MED ORDER — LIDOCAINE HCL 1 % IJ SOLN
INTRAMUSCULAR | Status: AC
  Filled 2021-09-08: qty 20

## 2021-09-08 MED ORDER — TICAGRELOR 90 MG PO TABS
90.0000 mg | ORAL_TABLET | Freq: Two times a day (BID) | ORAL | Status: DC
  Administered 2021-09-08 – 2021-09-09 (×2): 90 mg via ORAL
  Filled 2021-09-08 (×2): qty 1

## 2021-09-08 MED ORDER — HEPARIN (PORCINE) IN NACL 1000-0.9 UT/500ML-% IV SOLN
INTRAVENOUS | Status: DC | PRN
  Administered 2021-09-08 (×2): 500 mL

## 2021-09-08 MED ORDER — MIDAZOLAM HCL 2 MG/2ML IJ SOLN
INTRAMUSCULAR | Status: AC
  Filled 2021-09-08: qty 2

## 2021-09-08 MED ORDER — TICAGRELOR 90 MG PO TABS
ORAL_TABLET | ORAL | Status: DC | PRN
  Administered 2021-09-08: 180 mg via ORAL

## 2021-09-08 SURGICAL SUPPLY — 23 items
BALLN TREK RX 2.5X12 (BALLOONS) ×2
BALLN ~~LOC~~ TREK RX 4.5X12 (BALLOONS) ×2
BALLN ~~LOC~~ TREK RX 5.0X12 (BALLOONS) ×2
BALLOON TREK RX 2.5X12 (BALLOONS) IMPLANT
BALLOON ~~LOC~~ TREK RX 4.5X12 (BALLOONS) IMPLANT
BALLOON ~~LOC~~ TREK RX 5.0X12 (BALLOONS) IMPLANT
CATH 5FR JL3.5 JR4 ANG PIG MP (CATHETERS) ×1 IMPLANT
CATH EAGLE EYE PLAT IMAGING (CATHETERS) ×1 IMPLANT
CATH LAUNCHER 6FR EBU3.5 (CATHETERS) ×1 IMPLANT
DEVICE RAD COMP TR BAND LRG (VASCULAR PRODUCTS) ×1 IMPLANT
DRAPE BRACHIAL (DRAPES) ×1 IMPLANT
GLIDESHEATH SLEND SS 6F .021 (SHEATH) ×2 IMPLANT
GUIDEWIRE INQWIRE 1.5J.035X260 (WIRE) IMPLANT
INQWIRE 1.5J .035X260CM (WIRE) ×2
KIT ENCORE 26 ADVANTAGE (KITS) ×1 IMPLANT
KIT SYRINGE INJ CVI SPIKEX1 (MISCELLANEOUS) ×1 IMPLANT
PACK CARDIAC CATH (CUSTOM PROCEDURE TRAY) ×2 IMPLANT
PROTECTION STATION PRESSURIZED (MISCELLANEOUS) ×2
SET ATX SIMPLICITY (MISCELLANEOUS) ×1 IMPLANT
STATION PROTECTION PRESSURIZED (MISCELLANEOUS) IMPLANT
STENT ONYX FRONTIER 4.0X15 (Permanent Stent) ×1 IMPLANT
TUBING CIL FLEX 10 FLL-RA (TUBING) ×1 IMPLANT
WIRE RUNTHROUGH .014X180CM (WIRE) ×1 IMPLANT

## 2021-09-08 NOTE — Interval H&P Note (Signed)
Cath Lab Visit (complete for each Cath Lab visit) ? ?Clinical Evaluation Leading to the Procedure:  ? ?ACS: No. ? ?Non-ACS:   ? ?Anginal Classification: CCS III ? ?Anti-ischemic medical therapy: Maximal Therapy (2 or more classes of medications) ? ?Non-Invasive Test Results: High-risk stress test findings: cardiac mortality >3%/year ? ?Prior CABG: No previous CABG ? ? ? ? ? ?History and Physical Interval Note: ? ?09/08/2021 ?9:00 AM ? ?Brett Marshall  has presented today for surgery, with the diagnosis of unstable angina.  The various methods of treatment have been discussed with the patient and family. After consideration of risks, benefits and other options for treatment, the patient has consented to  Procedure(s): ?LEFT HEART CATH AND CORONARY ANGIOGRAPHY (N/A) as a surgical intervention.  The patient's history has been reviewed, patient examined, no change in status, stable for surgery.  I have reviewed the patient's chart and labs.  Questions were answered to the patient's satisfaction.   ? ? ?Brett Marshall ? ? ?

## 2021-09-08 NOTE — TOC Benefit Eligibility Note (Signed)
Patient Advocate Encounter ? ?Insurance verification completed.   ? ?The patient is currently admitted and upon discharge could be taking Brilinta 90 mg. ? ?The current 30 day co-pay is, $30.00.  ? ?The patient is insured through [employer]  ? ? ?Lyndel Safe, CPhT ?Pharmacy Patient Advocate Specialist ?[place] Patient Advocate Team ?Direct Number: [phone]  Fax: [phone] ? ? ? ? ? ?  ?

## 2021-09-08 NOTE — Progress Notes (Signed)
? ?Cardiology Progress Note  ? ?Patient Name: Brett Marshall ?Date of Encounter: 09/08/2021 ? ?Primary Cardiologist: [name], MD ? ?Subjective  ? ?Ongoing c/p this AM - mild when sitting, but worsens when lying flat or moving around room.  Worse w/ palpation.  No dyspnea.  NPO for cath this AM. ? ?Inpatient Medications  ?  ?Scheduled Meds: ? [START ON 09/09/2021] aspirin  81 mg Oral Pre-Cath  ? aspirin EC  81 mg Oral Daily  ? atorvastatin  10 mg Oral q1800  ? carvedilol  12.5 mg Oral BID  ? DULoxetine  60 mg Oral Daily  ? empagliflozin  25 mg Oral Daily  ? enalapril  10 mg Oral BID  ? enoxaparin (LOVENOX) injection  0.5 mg/kg Subcutaneous Q24H  ? famotidine  40 mg Oral Daily  ? glipiZIDE  5 mg Oral BID AC  ? insulin aspart  0-5 Units Subcutaneous QHS  ? insulin aspart  0-9 Units Subcutaneous TID WC  ? nystatin   Topical BID  ? sodium chloride flush  3 mL Intravenous Q12H  ? ?Continuous Infusions: ? sodium chloride    ? sodium chloride    ? ?PRN Meds: ?sodium chloride, acetaminophen **OR** acetaminophen, furosemide, hydrALAZINE, ipratropium-albuterol, magnesium hydroxide, metoprolol tartrate, nitroGLYCERIN, ondansetron **OR** ondansetron (ZOFRAN) IV, oxyCODONE, senna-docusate, sodium chloride flush, traZODone  ? ?Vital Signs  ?  ?Vitals:  ? 09/07/21 2026 09/07/21 2323 09/08/21 3419 09/08/21 0735  ?BP: 133/69 124/72 102/69 (!) 138/93  ?Pulse: 80 90 77 95  ?Resp: 18 20 18    ?Temp: 98.1 ?F (36.7 ?C) 98 ?F (36.7 ?C)  97.6 ?F (36.4 ?C)  ?TempSrc: Oral Oral    ?SpO2: 95% 99% 99% 100%  ?Weight:      ?Height:      ? ? ?Intake/Output Summary (Last 24 hours) at 09/08/2021 0759 ?Last data filed at 09/07/2021 3790 ?Gross per 24 hour  ?Intake 980 ml  ?Output --  ?Net 980 ml  ? ?Filed Weights  ? 09/04/21 2323  ?Weight: (!) 149.2 kg  ? ? ?Physical Exam  ? ?GEN: Well nourished, well developed, in no acute distress.  ?HEENT: Grossly normal.  ?Neck: Supple, no JVD, carotid bruits, or masses. ?Cardiac: RRR, 2/6 syst murmur  throughout, no rubs or gallops. No clubbing, cyanosis, edema.  Radials 2+, DP/PT 2+ and equal bilaterally.  ?Respiratory:  Respirations regular and unlabored, clear to auscultation bilaterally. ?GI: Obese, soft, nontender, nondistended, BS + x 4. ?MS: no deformity or atrophy. ?Skin: warm and dry, no rash. ?Neuro:  Strength and sensation are intact. ?Psych: AAOx3.  Normal affect. ? ?Labs  ?  ?Chemistry ?Recent Labs  ?Lab 09/04/21 ?2355 09/05/21 ?1007 09/08/21 ?0420  ?NA 135 137 138  ?K 4.4 4.1 4.4  ?CL 100 102 104  ?CO2 26 28 28   ?GLUCOSE 151* 118* 157*  ?BUN 21* 21* 19  ?CREATININE 1.22 1.06 1.17  ?CALCIUM 9.0 8.6* 8.9  ?GFRNONAA >60 >60 >60  ?ANIONGAP 9 7 6   ?  ? ?Hematology ?Recent Labs  ?Lab 09/05/21 ?1007 09/06/21 ?0401 09/08/21 ?0420  ?WBC 7.8 8.3 8.6  ?RBC 4.79 4.66 4.87  ?HGB 13.1 12.9* 13.2  ?HCT 40.8 40.4 41.8  ?MCV 85.2 86.7 85.8  ?MCH 27.3 27.7 27.1  ?MCHC 32.1 31.9 31.6  ?RDW 14.6 14.6 14.6  ?PLT 164 171 187  ? ? ?Cardiac Enzymes  ?Recent Labs  ?Lab 09/04/21 ?2355 09/05/21 ?0116  ?TROPONINIHS 25* 28*  ?   ? ?BNP ?   ?Component Value Date/Time  ?  BNP 44.3 09/04/2021 2355  ? ?DDimer  ?Recent Labs  ?Lab 09/04/21 ?2355  ?DDIMER 0.49  ?  ? ?Lipids  ?Lab Results  ?Component Value Date  ? CHOL 145 09/07/2021  ? HDL 43 09/07/2021  ? [place] 79 09/07/2021  ? TRIG 113 09/07/2021  ? CHOLHDL 3.4 09/07/2021  ? ? ?HbA1c  ?Lab Results  ?Component Value Date  ? HGBA1C 7.5 (H) 09/07/2021  ? ? ?Radiology  ?  ?DG Chest 2 View ? ?Result Date: 09/04/2021 ?CLINICAL DATA:  Chest pain. EXAM: CHEST - 2 VIEW COMPARISON:  Chest radiograph dated 09/11/2020. FINDINGS: The heart size and mediastinal contours are within normal limits. Both lungs are clear. The visualized skeletal structures are unremarkable. IMPRESSION: No active cardiopulmonary disease. Electronically Signed   By: Anner Crete M.D.   On: 09/04/2021 23:49  ? ?Telemetry  ?  ?RSR, frequent artifact - Personally Reviewed ? ?Cardiac Studies  ? ?Nuclear stress test  09-07-2021 ?  Findings are equivocal. The study is intermediate risk. ?  No ST deviation was noted. ?  LV perfusion is abnormal. Defect 1: There is a medium defect with moderate reduction in uptake present in the apical to mid anterior and anterolateral location(s) that is partially reversible. There is normal wall motion in the defect area. ?  Left ventricular function is normal. End diastolic cavity size is normal. End systolic cavity size is normal. ?  Prior study not available for comparison. ?  ?Body habitus, extracardiac activity affect both stress and rest images, but they appear to be worse on stress images. This was a 2 day study. There is a perfusion defect seen in the mid anterior to apical segments at rest. On stress imaging, there is a more pronounced perfusion defect in the mid to apical anterior and anterolateral wall. There is normal wall motion in this area. Overall cannot exclude ischemia; given image quality, cannot determine if ischemia vs. Artifact on stress imaging. ?  ?2D echo 09/05/2021: ?1. Left ventricular ejection fraction, by estimation, is 60 to 65%. The  ?left ventricle has normal function. The left ventricle has no regional  ?wall motion abnormalities. Left ventricular diastolic parameters are  ?consistent with Grade I diastolic  ?dysfunction (impaired relaxation).  ? 2. Right ventricular systolic function is normal. The right ventricular  ?size is normal.  ? 3. The mitral valve is normal in structure. No evidence of mitral valve  ?regurgitation. No evidence of mitral stenosis.  ? 4. The aortic valve is normal in structure. Aortic valve regurgitation is  ?not visualized. Aortic valve sclerosis is present, with no evidence of  ?aortic valve stenosis.  ? 5. The inferior vena cava is normal in size with greater than 50%  ?respiratory variability, suggesting right atrial pressure of 3 mmHg.  ? 6. Trivial pericardial effusion is present. ?_____________  ? ?Patient Profile  ?   ?56 y.o. male  with history of nonobstructive CAD by LHC in 2019, NICM, DM 2, HTN with orthostatic dizziness, morbid obesity, OSA on CPAP, and degenerative disc disease who is being seen today for the evaluation of chest pain at the request of Dr. Sidney Ace.  ? ?Assessment & Plan  ?  ?1.  Atypical chest pain/Nonobstructive CAD/Demand ischemia:  Pt w/ h/o chest pain and most recent cath in 08/2017 w/ mild LAD (30p, 62m 30d) and RCA dzs (40p).  Increased c/p over the past 2 wks, relatively constant in nature, worse w/ lying flat and ambulation.  Also worse w/ palpation.  HsTrop  only mildly elevated 25  28.  Echo w/ nl EF.  MV w/ partially reversible  apical to mid anterior and anterolateral defect.  Ongoing c/p this AM, though in good spirits.  Plan for diagnostic cath today.  The patient understands that risks include but are not limited to stroke (1 in 1000), death (1 in 57), kidney failure [usually temporary] (1 in 500), bleeding (1 in 200), allergic reaction [possibly serious] (1 in 200), and agrees to proceed.  Cont asa, statin, ? blocker, acei. ? ?2.  Essential HTN:  BPs variable, but overall stable.  Cont ? blocker and acei. ? ?3.  HL:  LDL 79.  Cont statin rx. ? ?4.  DMII:  A1c 7.5.  Insulin mgmt per IM. Would benefit from GLP1 agonist. ? ?5.  Morbid obesity:  consider GLP1 agonist as outpt. ? ?6.  OSA:  uses CPAP. ? ?Signed, ?Murray Hodgkins, NP  ?09/08/2021, 7:59 AM   ? ?For questions or updates, please contact   ?Please consult [email or web address] for contact info under Cardiology/STEMI.  ?

## 2021-09-09 ENCOUNTER — Inpatient Hospital Stay: Payer: BLUE CROSS/BLUE SHIELD

## 2021-09-09 ENCOUNTER — Other Ambulatory Visit: Payer: Self-pay

## 2021-09-09 DIAGNOSIS — E119 Type 2 diabetes mellitus without complications: Secondary | ICD-10-CM | POA: Diagnosis not present

## 2021-09-09 DIAGNOSIS — I200 Unstable angina: Secondary | ICD-10-CM | POA: Diagnosis not present

## 2021-09-09 DIAGNOSIS — R233 Spontaneous ecchymoses: Secondary | ICD-10-CM

## 2021-09-09 DIAGNOSIS — I259 Chronic ischemic heart disease, unspecified: Secondary | ICD-10-CM | POA: Diagnosis not present

## 2021-09-09 DIAGNOSIS — I5022 Chronic systolic (congestive) heart failure: Secondary | ICD-10-CM | POA: Diagnosis not present

## 2021-09-09 LAB — CBC
HCT: 41.2 % (ref 39.0–52.0)
Hemoglobin: 13.4 g/dL (ref 13.0–17.0)
MCH: 27.8 pg (ref 26.0–34.0)
MCHC: 32.5 g/dL (ref 30.0–36.0)
MCV: 85.5 fL (ref 80.0–100.0)
Platelets: 186 10*3/uL (ref 150–400)
RBC: 4.82 MIL/uL (ref 4.22–5.81)
RDW: 14.8 % (ref 11.5–15.5)
WBC: 8.7 10*3/uL (ref 4.0–10.5)
nRBC: 0 % (ref 0.0–0.2)

## 2021-09-09 LAB — BASIC METABOLIC PANEL
Anion gap: 5 (ref 5–15)
BUN: 17 mg/dL (ref 6–20)
CO2: 26 mmol/L (ref 22–32)
Calcium: 8.7 mg/dL — ABNORMAL LOW (ref 8.9–10.3)
Chloride: 105 mmol/L (ref 98–111)
Creatinine, Ser: 1.16 mg/dL (ref 0.61–1.24)
GFR, Estimated: >60 mL/min (ref >60)
Glucose, Bld: 154 mg/dL — ABNORMAL HIGH (ref 70–99)
Potassium: 4.3 mmol/L (ref 3.5–5.1)
Sodium: 136 mmol/L (ref 135–145)

## 2021-09-09 LAB — MAGNESIUM: Magnesium: 2.3 mg/dL (ref 1.7–2.4)

## 2021-09-09 LAB — GLUCOSE, CAPILLARY
Glucose-Capillary: 159 mg/dL — ABNORMAL HIGH (ref 70–99)
Glucose-Capillary: 216 mg/dL — ABNORMAL HIGH (ref 70–99)

## 2021-09-09 MED ORDER — DULOXETINE HCL 30 MG PO CPEP: 60.0000 mg | ORAL_CAPSULE | Freq: Every day | ORAL | Status: DC

## 2021-09-09 MED ORDER — TICAGRELOR 90 MG PO TABS: 90.0000 mg | ORAL_TABLET | Freq: Two times a day (BID) | ORAL | 0 refills | Status: DC

## 2021-09-09 MED ORDER — ATORVASTATIN CALCIUM 40 MG PO TABS: 40.0000 mg | ORAL_TABLET | Freq: Every day | ORAL | 2 refills | Status: DC

## 2021-09-09 MED ORDER — ASPIRIN 81 MG PO TBEC: 81.0000 mg | DELAYED_RELEASE_TABLET | Freq: Every day | ORAL | 2 refills | Status: DC

## 2021-09-09 NOTE — Progress Notes (Signed)
Discussed discharge instruction with patient including medication and follow up appointments.   Encourage patient to monitor radial site for infections and worsening swelling.  Also instructed patient to come back to ED if he experiences in issues with circulation (numbness in fingers, paleness and extremity being cold to the touch and lack of pulse noted).   ? ?Instruction sent home with patient and wife.  ?

## 2021-09-09 NOTE — Discharge Instructions (Addendum)
Advised to elevate his right upper extremity and can place ice packs several times throughout the day.  He can use Tylenol for pain.  If the swelling continues to worsen or starts experiencing worsening numbness tingling or notices cyanosis he will need to return to the ER for further care and management. ?Continue taking aspirin and Brilinta during this time. ?Cardiology services to arrange for close follow-up. ?

## 2021-09-09 NOTE — Progress Notes (Addendum)
?PROGRESS NOTE ? ? ? ?Brett Marshall  LVD:[id number] DOB: July 21, 1965 DOA: 09/04/2021 ?PCP: [name], MD  ? ?Brief Narrative:  ?56 year old gentleman with history of nonobstructive coronary artery disease, nonischemic cardiomyopathy, type 2 diabetes on oral hypoglycemics, hypertension without sensory changes, obstructive sleep apnea not using CPAP, morbid obesity presented to the hospital with intermittent left precordial and midsternal chest pain.  Echocardiogram did not show wall motion abnormality, Lexiscan showed abnormal studies therefore cardiology has scheduled patient for left heart cath on 4/24.  Which showed progression of LAD stenosis requiring drug-eluting stent placement.  Following day developed right upper extremity swelling, ultrasound was negative for pseudoaneurysm but did have significant soft tissue swelling. ? ? ?Assessment & Plan: ? Principal Problem: ?  Chest pain ?Active Problems: ?  Chronic systolic congestive heart failure ([place]) ?  Controlled type 2 diabetes mellitus without complication, without long-term current use of insulin ([place]) ?  Essential hypertension ?  Unstable angina ([facility]) ?  ?Typical chest pain, unstable angina ?History of nonobstructive CAD ?- Troponins and nonischemic, EKG does not show acute ST-T changes.  Echocardiogram did not show any wall motion abnormality, preserved ejection fraction.  Lexiscan showed abnormal studies showing perfusion defect therefore scheduled for left heart cath on 4/24.  Left heart cath showed progression of LAD requiring drug-eluting stent.  Recommended dual antiplatelet therapy for minimum 6 months and medical management. ?- Continue aspirin, statin, Coreg, enalapril and Jardiance. ?-Lipitor was increased to 40 mg daily. ? ?Right upper extremity swelling ?- Ultrasound is negative for any pseudoaneurysm  ? ?Type 2 diabetes ?-Currently on glipizide and Jardiance which should be help if confirmed by cardiology his LHC will be tomorrow and is  NPO PMN.  ?- Sliding scale and Accu-Cheks. ?- Continue to hold metformin ? ?Essential hypertension ?-Continue Coreg, enalapril ?  ?Sleep apnea ?- Noncompliant with his CPAP ? ?Depression ?- Continue Cymbalta ?  ?Morbid obesity: Will definitely benefit with weight loss and exercise, lifestyle changes.  Will need outpatient follow-up. ?  ? ? ? ?DVT prophylaxis: Lovenox ?Code Status: Full ?Family Communication: Significant other at bedside ? ?Status post left heart catheterization developed right upper extremity swelling.  Ultrasound negative for pseudoaneurysm. Dc once cleared by cardiology.  ? ? ? ?Subjective: ?No chest pain but has right upper extremity swelling. ? ?Examination: ? ?Constitutional: Not in acute distress ?Respiratory: Clear to auscultation bilaterally ?Cardiovascular: Normal sinus rhythm, no rubs ?Abdomen: Nontender nondistended good bowel sounds ?Musculoskeletal: Significant amount of swelling of the right upper extremity ?Skin: No rashes seen ?Neurologic: CN 2-12 grossly intact.  And nonfocal ?Psychiatric: Normal judgment and insight. Alert and oriented x 3. Normal mood. ? ?Objective: ?Vitals:  ? 09/08/21 2349 09/09/21 0532 09/09/21 0815 09/09/21 1123  ?BP: 103/76 92/69 125/80 (!) 109/96  ?Pulse: 97 87 88 92  ?Resp: 20 15 18 18   ?Temp: 98 ?F (36.7 ?C) 98 ?F (36.7 ?C) 97.8 ?F (36.6 ?C) 97.6 ?F (36.4 ?C)  ?TempSrc: Oral Oral    ?SpO2: 99% 100% 100% 100%  ?Weight:      ?Height:      ? ? ?Intake/Output Summary (Last 24 hours) at 09/09/2021 1141 ?Last data filed at 09/09/2021 1127 ?Gross per 24 hour  ?Intake 930 ml  ?Output 3 ml  ?Net 927 ml  ? ?Filed Weights  ? 09/04/21 2323 09/08/21 0834  ?Weight: (!) 149.2 kg (!) 149.2 kg  ? ? ? ?Data Reviewed:  ? ?CBC: ?Recent Labs  ?Lab 09/04/21 ?2355 09/05/21 ?1007 09/06/21 ?0401 09/08/21 ?  3662 09/09/21 ?9476  ?WBC 10.0 7.8 8.3 8.6 8.7  ?HGB 13.1 13.1 12.9* 13.2 13.4  ?HCT 39.7 40.8 40.4 41.8 41.2  ?MCV 83.8 85.2 86.7 85.8 85.5  ?PLT 188 164 171 187 186  ? ?Basic  Metabolic Panel: ?Recent Labs  ?Lab 09/04/21 ?2355 09/05/21 ?1007 09/08/21 ?0420 09/09/21 ?5465  ?NA 135 137 138 136  ?K 4.4 4.1 4.4 4.3  ?CL 100 102 104 105  ?CO2 26 28 28 26   ?GLUCOSE 151* 118* 157* 154*  ?BUN 21* 21* 19 17  ?CREATININE 1.22 1.06 1.17 1.16  ?CALCIUM 9.0 8.6* 8.9 8.7*  ?MG  --   --  2.2 2.3  ? ?GFR: ?Estimated Creatinine Clearance: 101.3 mL/min (by C-G formula based on SCr of 1.16 mg/dL). ?Liver Function Tests: ?No results for input(s): AST, ALT, ALKPHOS, BILITOT, PROT, ALBUMIN in the last 168 hours. ?No results for input(s): LIPASE, AMYLASE in the last 168 hours. ?No results for input(s): AMMONIA in the last 168 hours. ?Coagulation Profile: ?Recent Labs  ?Lab 09/05/21 ?0228  ?INR 1.1  ? ?Cardiac Enzymes: ?No results for input(s): CKTOTAL, CKMB, CKMBINDEX, TROPONINI in the last 168 hours. ?BNP (last 3 results) ?No results for input(s): PROBNP in the last 8760 hours. ?HbA1C: ?Recent Labs  ?  09/07/21 ?0924  ?HGBA1C 7.5*  ? ?CBG: ?Recent Labs  ?Lab 09/08/21 ?1151 09/08/21 ?1631 09/08/21 ?2014 09/09/21 ?0354 09/09/21 ?1123  ?GLUCAP 154* 184* 192* 159* 216*  ? ?Lipid Profile: ?Recent Labs  ?  09/07/21 ?0924  ?CHOL 145  ?HDL 43  ?[place] 79  ?TRIG 113  ?CHOLHDL 3.4  ? ?Thyroid Function Tests: ?No results for input(s): TSH, T4TOTAL, FREET4, T3FREE, THYROIDAB in the last 72 hours. ?Anemia Panel: ?No results for input(s): VITAMINB12, FOLATE, FERRITIN, TIBC, IRON, RETICCTPCT in the last 72 hours. ?Sepsis Labs: ?No results for input(s): PROCALCITON, LATICACIDVEN in the last 168 hours. ? ?No results found for this or any previous visit (from the past 240 hour(s)).  ? ? ? ? ? ?Radiology Studies: ?CARDIAC CATHETERIZATION ? ?Result Date: 09/08/2021 ?  Mid LAD lesion is 20% stenosed.   Dist LAD lesion is 30% stenosed.   Prox RCA lesion is 50% stenosed.   Ost LAD lesion is 95% stenosed.   A drug-eluting stent was successfully placed using a STENT ONYX FRONTIER 4.0X15.   Post intervention, there is a 0% residual  stenosis.   The left ventricular systolic function is normal.   LV end diastolic pressure is mildly elevated.   The left ventricular ejection fraction is 55-65% by visual estimate. 1.  Severe one-vessel coronary artery disease with 95% ostial LAD stenosis likely due to plaque rupture given that this area was only 30% on previous cardiac catheterization in 2019.  Moderate proximal RCA disease does not seem to be different from before. 2.  Normal LV systolic function and mildly elevated left ventricular end-diastolic pressure. 3.  Moderate systolic LVOT gradient of 25 to 30 mmHg 4.  Successful IVUS guided PCI and drug-eluting stent placement to the ostial LAD. 5.  The procedure was done via the right ulnar artery given that the radial artery was very small in size. Recommendations: Dual antiplatelet therapy for at least 12 months. Aggressive treatment of risk factors.  ? ?[place] UPPER EXTREMITY ARTERIAL RIGHT LIMITED (GRAFT, SINGLE VESSEL) ? ?Result Date: 09/09/2021 ?CLINICAL DATA:  56 year old male status post cardiac catheterization with possible pseudoaneurysm or fistula EXAM: RIGHT UPPER EXTREMITY ARTERIAL DUPLEX SCAN TECHNIQUE: Gray-scale sonography as well as color Doppler and duplex  ultrasound was performed to evaluate the arteries of the upper extremity. COMPARISON:  None. FINDINGS: Duplex performed in the region of clinical concern in the right upper extremity. Triphasic waveforms of the ulnar artery. Radial artery was not imaged. Soft tissue swelling, with the largest component estimated 5.1 cm x 3.1 cm, superficial to the vasculature. Patency of the veins. No evidence of fistula or pseudoaneurysm. IMPRESSION: Hematoma within the superficial soft tissues of the right upper extremity. Duplex of the ulnar artery negative for pseudoaneurysm or fistula. The radial artery was not imaged. Signed, Dulcy Fanny. Dellia Nims, RPVI [facility] Radiology Electronically Signed   By:  Corrie Mckusick D.O.   On: 09/09/2021 11:27   ? ? ? ? ? ?Scheduled Meds: ? aspirin EC  81 mg Oral Daily  ? atorvastatin  40 mg Oral q1800  ? carvedilol  12.5 mg Oral BID  ? DULoxetine  60 mg Oral Daily  ? empa

## 2021-09-09 NOTE — Progress Notes (Addendum)
? ?Cardiology Progress Note  ? ?Patient Name: Brett Marshall ?Date of Encounter: 09/09/2021 ? ?Primary Cardiologist: [name], MD ? ?Subjective  ? ?Has noted significant improvement in activity tolerance.  Has been able to walk to and from the bathroom w/o chest pain for the first time since arrival.  No dyspnea.  R wrist and forearm are swollen and painful.  Mild intermittent numbness of his fingers on the right hand. ? ?Inpatient Medications  ?  ?Scheduled Meds: ? aspirin EC  81 mg Oral Daily  ? atorvastatin  40 mg Oral q1800  ? carvedilol  12.5 mg Oral BID  ? DULoxetine  60 mg Oral Daily  ? empagliflozin  25 mg Oral Daily  ? enalapril  10 mg Oral BID  ? enoxaparin (LOVENOX) injection  0.5 mg/kg Subcutaneous Q24H  ? famotidine  40 mg Oral Daily  ? glipiZIDE  5 mg Oral BID AC  ? insulin aspart  0-5 Units Subcutaneous QHS  ? insulin aspart  0-9 Units Subcutaneous TID WC  ? nystatin   Topical BID  ? sodium chloride flush  3 mL Intravenous Q12H  ? ticagrelor  90 mg Oral BID  ? ?Continuous Infusions: ? sodium chloride    ? ?PRN Meds: ?sodium chloride, acetaminophen **OR** acetaminophen, furosemide, hydrALAZINE, ipratropium-albuterol, magnesium hydroxide, metoprolol tartrate, nitroGLYCERIN, ondansetron **OR** ondansetron (ZOFRAN) IV, oxyCODONE, senna-docusate, sodium chloride flush, traZODone  ? ?Vital Signs  ?  ?Vitals:  ? 09/08/21 2015 09/08/21 2349 09/09/21 0532 09/09/21 0815  ?BP: 105/86 103/76 92/69 125/80  ?Pulse: (!) 101 97 87 88  ?Resp: 18 20 15 18   ?Temp: 98.6 ?F (37 ?C) 98 ?F (36.7 ?C) 98 ?F (36.7 ?C) 97.8 ?F (36.6 ?C)  ?TempSrc:  Oral Oral   ?SpO2: 99% 99% 100% 100%  ?Weight:      ?Height:      ? ? ?Intake/Output Summary (Last 24 hours) at 09/09/2021 0944 ?Last data filed at 09/08/2021 1600 ?Gross per 24 hour  ?Intake 690 ml  ?Output --  ?Net 690 ml  ? ?Filed Weights  ? 09/04/21 2323 09/08/21 0834  ?Weight: (!) 149.2 kg (!) 149.2 kg  ? ? ?Physical Exam  ? ?GEN: Obese, in no acute distress.  ?HEENT:  Grossly normal.  ?Neck: Supple, obese, difficult to gauge JVP.  No bruits or masses. ?Cardiac: RRR, 2/6 syst murmur throughout, no rubs or gallops. No clubbing, cyanosis, edema.  Radials absent on R, 2+ on left, DP/PT 2+ and equal bilaterally.  R wrist and forearm are swollen and tender.  Ecchymosis noted to supine surface (pt says this was present prior to cath). ?Respiratory:  Respirations regular and unlabored, clear to auscultation bilaterally. ?GI: Obese, soft, nontender, nondistended, BS + x 4. ?MS: no deformity or atrophy. ?Skin: warm and dry, no rash. ?Neuro:  Strength and sensation are intact. ?Psych: AAOx3.  Normal affect. ? ?Labs  ?  ?Chemistry ?Recent Labs  ?Lab 09/05/21 ?1007 09/08/21 ?0420 09/09/21 ?8453  ?NA 137 138 136  ?K 4.1 4.4 4.3  ?CL 102 104 105  ?CO2 28 28 26   ?GLUCOSE 118* 157* 154*  ?BUN 21* 19 17  ?CREATININE 1.06 1.17 1.16  ?CALCIUM 8.6* 8.9 8.7*  ?GFRNONAA >60 >60 >60  ?ANIONGAP 7 6 5   ?  ? ?Hematology ?Recent Labs  ?Lab 09/06/21 ?0401 09/08/21 ?6468 09/09/21 ?0321  ?WBC 8.3 8.6 8.7  ?RBC 4.66 4.87 4.82  ?HGB 12.9* 13.2 13.4  ?HCT 40.4 41.8 41.2  ?MCV 86.7 85.8 85.5  ?MCH 27.7  27.1 27.8  ?MCHC 31.9 31.6 32.5  ?RDW 14.6 14.6 14.8  ?PLT 171 187 186  ? ? ?Cardiac Enzymes  ?Recent Labs  ?Lab 09/04/21 ?2355 09/05/21 ?0116  ?TROPONINIHS 25* 28*  ?   ? ?BNP ?   ?Component Value Date/Time  ? BNP 44.3 09/04/2021 2355  ? ?DDimer  ?Recent Labs  ?Lab 09/04/21 ?2355  ?DDIMER 0.49  ?  ? ?Lipids  ?Lab Results  ?Component Value Date  ? CHOL 145 09/07/2021  ? HDL 43 09/07/2021  ? [place] 79 09/07/2021  ? TRIG 113 09/07/2021  ? CHOLHDL 3.4 09/07/2021  ? ? ?HbA1c  ?Lab Results  ?Component Value Date  ? HGBA1C 7.5 (H) 09/07/2021  ? ? ?Radiology  ?  ?-------------------- ? ?Telemetry  ?  ?RSR - Personally Reviewed ? ?ECG  ?  ?RSR, 85, no acute ST/T changes - Personally Reviewed ? ?Cardiac Studies  ? ?Cardiac Catheterization and Percutaneous Coronary Intervention 4.24.2023 ? ?Left Main  ?Vessel is  angiographically normal.  ?Left Anterior Descending  ?Ost LAD lesion is 95% stenosed. The lesion is type C.  ?     **s/p PCI/DES w/ 4.0 x 15 Onyx Frontier DES**  ?Mid LAD lesion is 20% stenosed.  ?Dist LAD lesion is 30% stenosed.  ?Second Diagonal Branch  ?There is mild disease in the vessel.  ?Ramus Intermedius  ?The vessel exhibits minimal luminal irregularities.  ?Left Circumflex  ?The vessel exhibits minimal luminal irregularities.  ?Right Coronary Artery  ?Vessel is small.  ?Prox RCA lesion is 50% stenosed.  ? ?EF 55-65% ?_____________  ? ?Nuclear stress test 09/06/21 ?  Findings are equivocal. The study is intermediate risk. ?  No ST deviation was noted. ?  LV perfusion is abnormal. Defect 1: There is a medium defect with moderate reduction in uptake present in the apical to mid anterior and anterolateral location(s) that is partially reversible. There is normal wall motion in the defect area. ?  Left ventricular function is normal. End diastolic cavity size is normal. End systolic cavity size is normal. ?  Prior study not available for comparison. ?  ?Body habitus, extracardiac activity affect both stress and rest images, but they appear to be worse on stress images. This was a 2 day study. There is a perfusion defect seen in the mid anterior to apical segments at rest. On stress imaging, there is a more pronounced perfusion defect in the mid to apical anterior and anterolateral wall. There is normal wall motion in this area. Overall cannot exclude ischemia; given image quality, cannot determine if ischemia vs. Artifact on stress imaging. ?  ?2D echo 09/05/2021: ?1. Left ventricular ejection fraction, by estimation, is 60 to 65%. The  ?left ventricle has normal function. The left ventricle has no regional  ?wall motion abnormalities. Left ventricular diastolic parameters are  ?consistent with Grade I diastolic  ?dysfunction (impaired relaxation).  ? 2. Right ventricular systolic function is normal. The right  ventricular  ?size is normal.  ? 3. The mitral valve is normal in structure. No evidence of mitral valve  ?regurgitation. No evidence of mitral stenosis.  ? 4. The aortic valve is normal in structure. Aortic valve regurgitation is  ?not visualized. Aortic valve sclerosis is present, with no evidence of  ?aortic valve stenosis.  ? 5. The inferior vena cava is normal in size with greater than 50%  ?respiratory variability, suggesting right atrial pressure of 3 mmHg.  ? 6. Trivial pericardial effusion is present. ?_____________  ?  ? ?  Patient Profile  ?   ?56 y.o. male with history of nonobstructive CAD by [place] in 2019, NICM, DM 2, HTN with orthostatic dizziness, morbid obesity, OSA on CPAP, and degenerative disc disease who was admitted 4/20 w/ unstable angina  Abnl stress test  S/p PCI/DES to the prox LAD. ? ?Assessment & Plan  ?  ?1. Unstable angina/CAD:  Admitted w~ 2 wk h/o constant c/p, sometimes worse w/ palpation, and also worse w/ exertion.  HsTrop minimally elevated @ 25  28.  Echo w/ nl EF.  MV w/ apical to mid anterior and anterolateral ischemia. Cath w/ severe proximal LAD dzs now s/p DES.  Post-pci, has noted improved activity intolerance w/o exertional chest pain or dyspnea.  Still some mild msk pain, but overall feels much better.  R wrist/forearm swollen and painful (see below).  Cont asa, statin, ? blocker, acei, and brilinta. ? ?2.  R wrist/forearm swelling:  pt w/ swelling, pain, and tenderness to the right wrist/forearm w/ intermittent numbness in his fingers.  No obvious hematoma, good distal flow.  Concern for post-procedure bleeding into forearm.  H/H stable.  Will arrange for R ulnar vasc u/s to r/o AVF/PSA.   ? ?3.  Essential HTN:  Stable on ? blocker and acei. ? ?4.  HL:  LDL 79. Cont statin rx. ? ?5.  DMII:  A1c 7.5.  Insulin mgmt per IM.  On jardiance.  Would benefit from GLP1 agonist - can consider as outpt. ? ?6.  Morbid obesity:  Will benefit from cardiac rehab.  Consider GLP1  agonist. ? ?7.  OSA:  uses CPAP. ? ?Signed, ?Murray Hodgkins, NP  ?09/09/2021, 9:44 AM   ? ?For questions or updates, please contact   ?Please consult [email or web address] for contact info under Cardiology/STEMI.  ?

## 2021-09-09 NOTE — Progress Notes (Signed)
?PROGRESS NOTE ? ? ? ?MONTRE HARBOR  EGB:[id number] DOB: 28-May-1965 DOA: 09/04/2021 ?PCP: [name], MD  ? ?Brief Narrative:  ?56 year old gentleman with history of nonobstructive coronary artery disease, nonischemic cardiomyopathy, type 2 diabetes on oral hypoglycemics, hypertension without sensory changes, obstructive sleep apnea not using CPAP, morbid obesity presented to the hospital with intermittent left precordial and midsternal chest pain.  Echocardiogram did not show wall motion abnormality, Lexiscan showed abnormal studies therefore cardiology has scheduled patient for left heart cath on 4/24.  Which showed progression of LAD stenosis requiring drug-eluting stent placement. ? ? ?Assessment & Plan: ? Principal Problem: ?  Chest pain ?Active Problems: ?  Chronic systolic congestive heart failure ([place]) ?  Controlled type 2 diabetes mellitus without complication, without long-term current use of insulin ([place]) ?  Essential hypertension ?  Unstable angina (HCC) ?  ?Typical chest pain, unstable angina ?History of nonobstructive CAD ?- Troponins and nonischemic, EKG does not show acute ST-T changes.  Echocardiogram did not show any wall motion abnormality, preserved ejection fraction.  Lexiscan showed abnormal studies showing perfusion defect therefore scheduled for left heart cath on 4/24.  Left heart cath showed progression of LAD requiring drug-eluting stent.  Recommended dual antiplatelet therapy for minimum 6 months and medical management. ?- Continue aspirin, statin, Coreg, enalapril and Jardiance. ?-Lipitor was increased to 40 mg daily. ? ?Type 2 diabetes ?-Currently on glipizide and Jardiance which should be help if confirmed by cardiology his LHC will be tomorrow and is NPO PMN.  ?- Sliding scale and Accu-Cheks. ?- Continue to hold metformin ? ?Essential hypertension ?-Continue Coreg, enalapril ?  ?Sleep apnea ?- Noncompliant with his CPAP ? ?Depression ?- Continue Cymbalta ?  ?Morbid obesity: Will  definitely benefit with weight loss and exercise, lifestyle changes.  Will need outpatient follow-up. ?  ? ? ? ?DVT prophylaxis: Lovenox ?Code Status: Full ?Family Communication: Significant other at bedside ? ?Maintain hospital stay for cardiac catheterization done today, discharge once cleared by cardiology. ? ? ? ? ? ? ?Subjective: ?Seen after his left heart cath, no issues.  Feeling well. ? ?Examination: ? ?General exam: Appears calm and comfortable  ?Respiratory system: Clear to auscultation. Respiratory effort normal. ?Cardiovascular system: S1 & S2 heard, RRR. No JVD, murmurs, rubs, gallops or clicks. No pedal edema. ?Gastrointestinal system: Abdomen is nondistended, soft and nontender. No organomegaly or masses felt. Normal bowel sounds heard. ?Central nervous system: Alert and oriented. No focal neurological deficits. ?Extremities: Symmetric 5 x 5 power. ?Skin: No rashes, lesions or ulcers ?Psychiatry: Judgement and insight appear normal. Mood & affect appropriate.  ? ? ? ?Objective: ?Vitals:  ? 09/08/21 2015 09/08/21 2349 09/09/21 0532 09/09/21 0815  ?BP: 105/86 103/76 92/69 125/80  ?Pulse: (!) 101 97 87 88  ?Resp: 18 20 15 18   ?Temp: 98.6 ?F (37 ?C) 98 ?F (36.7 ?C) 98 ?F (36.7 ?C) 97.8 ?F (36.6 ?C)  ?TempSrc:  Oral Oral   ?SpO2: 99% 99% 100% 100%  ?Weight:      ?Height:      ? ? ?Intake/Output Summary (Last 24 hours) at 09/09/2021 0815 ?Last data filed at 09/08/2021 1600 ?Gross per 24 hour  ?Intake 690 ml  ?Output --  ?Net 690 ml  ? ?Filed Weights  ? 09/04/21 2323 09/08/21 0834  ?Weight: (!) 149.2 kg (!) 149.2 kg  ? ? ? ?Data Reviewed:  ? ?CBC: ?Recent Labs  ?Lab 09/04/21 ?2355 09/05/21 ?1007 09/06/21 ?0401 09/08/21 ?0420 09/09/21 ?3710  ?WBC 10.0 7.8 8.3 8.6  8.7  ?HGB 13.1 13.1 12.9* 13.2 13.4  ?HCT 39.7 40.8 40.4 41.8 41.2  ?MCV 83.8 85.2 86.7 85.8 85.5  ?PLT 188 164 171 187 186  ? ?Basic Metabolic Panel: ?Recent Labs  ?Lab 09/04/21 ?2355 09/05/21 ?1007 09/08/21 ?0420 09/09/21 ?9147  ?NA 135 137 138 136   ?K 4.4 4.1 4.4 4.3  ?CL 100 102 104 105  ?CO2 26 28 28 26   ?GLUCOSE 151* 118* 157* 154*  ?BUN 21* 21* 19 17  ?CREATININE 1.22 1.06 1.17 1.16  ?CALCIUM 9.0 8.6* 8.9 8.7*  ?MG  --   --  2.2 2.3  ? ?GFR: ?Estimated Creatinine Clearance: 101.3 mL/min (by C-G formula based on SCr of 1.16 mg/dL). ?Liver Function Tests: ?No results for input(s): AST, ALT, ALKPHOS, BILITOT, PROT, ALBUMIN in the last 168 hours. ?No results for input(s): LIPASE, AMYLASE in the last 168 hours. ?No results for input(s): AMMONIA in the last 168 hours. ?Coagulation Profile: ?Recent Labs  ?Lab 09/05/21 ?0228  ?INR 1.1  ? ?Cardiac Enzymes: ?No results for input(s): CKTOTAL, CKMB, CKMBINDEX, TROPONINI in the last 168 hours. ?BNP (last 3 results) ?No results for input(s): PROBNP in the last 8760 hours. ?HbA1C: ?Recent Labs  ?  09/07/21 ?0924  ?HGBA1C 7.5*  ? ?CBG: ?Recent Labs  ?Lab 09/07/21 ?1612 09/07/21 ?2027 09/08/21 ?1151 09/08/21 ?1631 09/08/21 ?2014  ?GLUCAP 159* 115* 154* 184* 192*  ? ?Lipid Profile: ?Recent Labs  ?  09/07/21 ?0924  ?CHOL 145  ?HDL 43  ?[place] 79  ?TRIG 113  ?CHOLHDL 3.4  ? ?Thyroid Function Tests: ?No results for input(s): TSH, T4TOTAL, FREET4, T3FREE, THYROIDAB in the last 72 hours. ?Anemia Panel: ?No results for input(s): VITAMINB12, FOLATE, FERRITIN, TIBC, IRON, RETICCTPCT in the last 72 hours. ?Sepsis Labs: ?No results for input(s): PROCALCITON, LATICACIDVEN in the last 168 hours. ? ?No results found for this or any previous visit (from the past 240 hour(s)).  ? ? ? ? ? ?Radiology Studies: ?CARDIAC CATHETERIZATION ? ?Result Date: 09/08/2021 ?  Mid LAD lesion is 20% stenosed.   Dist LAD lesion is 30% stenosed.   Prox RCA lesion is 50% stenosed.   Ost LAD lesion is 95% stenosed.   A drug-eluting stent was successfully placed using a STENT ONYX FRONTIER 4.0X15.   Post intervention, there is a 0% residual stenosis.   The left ventricular systolic function is normal.   LV end diastolic pressure is mildly elevated.   The left  ventricular ejection fraction is 55-65% by visual estimate. 1.  Severe one-vessel coronary artery disease with 95% ostial LAD stenosis likely due to plaque rupture given that this area was only 30% on previous cardiac catheterization in 2019.  Moderate proximal RCA disease does not seem to be different from before. 2.  Normal LV systolic function and mildly elevated left ventricular end-diastolic pressure. 3.  Moderate systolic LVOT gradient of 25 to 30 mmHg 4.  Successful IVUS guided PCI and drug-eluting stent placement to the ostial LAD. 5.  The procedure was done via the right ulnar artery given that the radial artery was very small in size. Recommendations: Dual antiplatelet therapy for at least 12 months. Aggressive treatment of risk factors.   ? ? ? ? ? ?Scheduled Meds: ? aspirin EC  81 mg Oral Daily  ? atorvastatin  40 mg Oral q1800  ? carvedilol  12.5 mg Oral BID  ? DULoxetine  60 mg Oral Daily  ? empagliflozin  25 mg Oral Daily  ? enalapril  10 mg  Oral BID  ? enoxaparin (LOVENOX) injection  0.5 mg/kg Subcutaneous Q24H  ? famotidine  40 mg Oral Daily  ? glipiZIDE  5 mg Oral BID AC  ? insulin aspart  0-5 Units Subcutaneous QHS  ? insulin aspart  0-9 Units Subcutaneous TID WC  ? nystatin   Topical BID  ? sodium chloride flush  3 mL Intravenous Q12H  ? ticagrelor  90 mg Oral BID  ? ?Continuous Infusions: ? sodium chloride    ? ? ? LOS: 1 day  ? ?Time spent= 35 mins ? ? ? ?Brunilda Eble Arsenio Loader, MD ?[facility] ? ?If 7PM-7AM, please contact night-coverage ? ?09/09/2021, 8:15 AM  ? ?

## 2021-09-09 NOTE — Discharge Summary (Signed)
Physician Discharge Summary  ?Brett Marshall FTD:[id number] DOB: Apr 06, 1966 DOA: 09/04/2021 ? ?PCP: [name], MD ? ?Admit date: 09/04/2021 ?Discharge date: 09/09/2021 ? ?Admitted From: Home ?Disposition:  Home ? ?Recommendations for Outpatient Follow-up:  ?Follow up with PCP in 1-2 weeks ?Please obtain BMP/CBC in one week your next doctors visit.  ?Monitor RUE, if worsens advised to come to ED. Close Follow up will be arranged by Cardiology  ?Cont ASA and Brilinta.  ?Tylenol for pain ?Advised to elevated arm.  ?Lipitor 40 mg daily ? ?Home Health: None ?Equipment/Devices: ?Discharge Condition: Stable ?CODE STATUS:  ?Diet recommendation:  ? ?Brief/Interim Summary: ?56 year old gentleman with history of nonobstructive coronary artery disease, nonischemic cardiomyopathy, type 2 diabetes on oral hypoglycemics, hypertension without sensory changes, obstructive sleep apnea not using CPAP, morbid obesity presented to the hospital with intermittent left precordial and midsternal chest pain.  Echocardiogram did not show wall motion abnormality, Lexiscan showed abnormal studies therefore cardiology has scheduled patient for left heart cath on 4/24.  Which showed progression of LAD stenosis requiring drug-eluting stent placement.  Following day developed right upper extremity swelling, ultrasound was negative for pseudoaneurysm but did have significant soft tissue swelling.  Cardiology recommended discharging patient with close outpatient follow-up.  Patient has been advised to return to the ED if the right upper extremity swelling continues to worsen. ? ?  ?  ?Assessment & Plan: ? Principal Problem: ?  Chest pain ?Active Problems: ?  Chronic systolic congestive heart failure ([place]) ?  Controlled type 2 diabetes mellitus without complication, without long-term current use of insulin ([place]) ?  Essential hypertension ?  Unstable angina (HCC) ?  ?Typical chest pain, unstable angina ?History of nonobstructive CAD ?- Troponins and  nonischemic, EKG does not show acute ST-T changes.  Echocardiogram did not show any wall motion abnormality, preserved ejection fraction.  Lexiscan showed abnormal studies showing perfusion defect therefore scheduled for left heart cath on 4/24.  Left heart cath showed progression of LAD requiring drug-eluting stent.  Recommended dual antiplatelet therapy for minimum 6 months and medical management. ?- Continue aspirin, statin, Coreg, enalapril and Jardiance. ?-Lipitor was increased to 40 mg daily.  Outpatient cardiology follow-up. ?  ?Right upper extremity swelling ?- Ultrasound is negative for any pseudoaneurysm.  Cleared by cardiology for discharge.  Continue aspirin and Brilinta.  If this worsens he will need to return to the ER.  Close follow-up will be arranged by cardiology service. ?  ?Type 2 diabetes ?-Resume his home regimen.  Hemoglobin A1c 7.5.  Continue Jardiance, glipizide, metformin. ?  ?Essential hypertension ?-Continue Coreg, enalapril ?  ?Sleep apnea ?- Noncompliant with his CPAP ?  ?Depression ?- Continue Cymbalta ?  ?Morbid obesity: Will definitely benefit with weight loss and exercise, lifestyle changes.  Will need outpatient follow-up. ?  ? ? ? ?Discharge Diagnoses:  ?Principal Problem: ?  Chest pain ?Active Problems: ?  Chronic systolic congestive heart failure ([place]) ?  Controlled type 2 diabetes mellitus without complication, without long-term current use of insulin ([place]) ?  Essential hypertension ?  Unstable angina (HCC) ? ? ? ? ? ?Consultations: ?Cardiology ? ?Subjective: ?Has right upper extremity swelling at the catheterization site. ? ?Discharge Exam: ?Vitals:  ? 09/09/21 0815 09/09/21 1123  ?BP: 125/80 (!) 109/96  ?Pulse: 88 92  ?Resp: 18 18  ?Temp: 97.8 ?F (36.6 ?C) 97.6 ?F (36.4 ?C)  ?SpO2: 100% 100%  ? ?Vitals:  ? 09/08/21 2349 09/09/21 0532 09/09/21 0815 09/09/21 1123  ?BP: 103/76 92/69 125/80 Marland Kitchen)  109/96  ?Pulse: 97 87 88 92  ?Resp: 20 15 18 18   ?Temp: 98 ?F (36.7 ?C) 98 ?F (36.7  ?C) 97.8 ?F (36.6 ?C) 97.6 ?F (36.4 ?C)  ?TempSrc: Oral Oral    ?SpO2: 99% 100% 100% 100%  ?Weight:      ?Height:      ? ? ?General: Pt is alert, awake, not in acute distress ?Cardiovascular: RRR, S1/S2 +, no rubs, no gallops ?Respiratory: CTA bilaterally, no wheezing, no rhonchi ?Abdominal: Soft, NT, ND, bowel sounds + ?Extremities: Right upper extremity slight hematoma and swelling.  Sensation intact. ? ?Discharge Instructions ? ?Discharge Instructions   ? ? AMB Referral to Cardiac Rehabilitation - Phase II   Complete by: As directed ?  ? Diagnosis:  Coronary Stents ?Stable Angina  ?  ? After initial evaluation and assessments completed: Virtual Based Care may be provided alone or in conjunction with Phase 2 Cardiac Rehab based on patient barriers.: Yes  ? ?  ? ?Allergies as of 09/09/2021   ?No Known Allergies ?  ? ?  ?Medication List  ?  ? ?TAKE these medications   ? ?albuterol 108 (90 Base) MCG/ACT inhaler ?Commonly known as: VENTOLIN HFA ?Inhale 1-2 puffs into the lungs every 6 (six) hours as needed for wheezing or shortness of breath. ?  ?aspirin 81 MG EC tablet ?Take 1 tablet (81 mg total) by mouth daily. Swallow whole. ?Start taking on: September 10, 2021 ?  ?atorvastatin 40 MG tablet ?Commonly known as: LIPITOR ?Take 1 tablet (40 mg total) by mouth daily at 6 PM. ?What changed:  ?medication strength ?how much to take ?  ?carvedilol 12.5 MG tablet ?Commonly known as: COREG ?Take 1 tablet (12.5 mg total) by mouth 2 (two) times daily. ?  ?cyclobenzaprine 10 MG tablet ?Commonly known as: FLEXERIL ?Take 1 tablet (10 mg total) by mouth 3 (three) times daily as needed.For spasms ?  ?DULoxetine 30 MG capsule ?Commonly known as: CYMBALTA ?Take 2 capsules (60 mg total) by mouth daily. ?  ?enalapril 10 MG tablet ?Commonly known as: VASOTEC ?Take 1 tablet (10 mg total) by mouth 2 (two) times daily. ?  ?famotidine 40 MG tablet ?Commonly known as: PEPCID ?Take 40 mg by mouth daily. ?  ?furosemide 20 MG tablet ?Commonly known  as: LASIX ?Take 1 tablet (20 mg total) by mouth daily as needed (for SOB or weight gain of 3 lbs overnight). ?  ?glipiZIDE 5 MG tablet ?Commonly known as: GLUCOTROL ?Take 1 tablet (5 mg total) by mouth 2 (two) times daily before a meal. ?  ?glucose blood test strip ?Commonly known as: GE100 Blood Glucose Test ?Use as instructed ?  ?Jardiance 25 MG Tabs tablet ?Generic drug: empagliflozin ?Take 25 mg by mouth daily. ?  ?metFORMIN 1000 MG tablet ?Commonly known as: GLUCOPHAGE ?Take 1 tablet (1,000 mg total) by mouth 2 (two) times daily with a meal. ?  ?onetouch ultrasoft lancets ?Check sugar twice daily ?  ?ticagrelor 90 MG Tabs tablet ?Commonly known as: BRILINTA ?Take 1 tablet (90 mg total) by mouth 2 (two) times daily. ?  ? ?  ? ? Follow-up Information   ? ? [name], MD. Schedule an appointment as soon as possible for a visit in 1 month(s).   ?Specialty: Family Medicine ?Contact information: ?[address] ?[place] [address] ?[phone] ? ? ?  ?  ? ? Wellington Hampshire, MD .   ?Specialty: Cardiology ?Contact information: ?[address] ?[address] ?[place]  [address] ?[phone] ? ? ?  ?  ? ?  ?  ? ?  ? ?No Known Allergies ? ?You were cared for by a hospitalist during your hospital stay. If you have any questions about your discharge medications or the care you received while you were in the hospital after you are discharged, you can call the unit and asked to speak with the hospitalist on call if the hospitalist that took care of you is not available. Once you are discharged, your primary care physician will handle any further medical issues. Please note that no refills for any discharge medications will be authorized once you are discharged, as it is imperative that you return to your primary care physician (or establish a relationship with a primary care physician if you do not have one) for your aftercare needs so that they can reassess your need for medications and monitor your lab  values. ? ? ?Procedures/Studies: ?DG Chest 2 View ? ?Result Date: 09/04/2021 ?CLINICAL DATA:  Chest pain. EXAM: CHEST - 2 VIEW COMPARISON:  Chest radiograph dated 09/11/2020. FINDINGS: The heart size an

## 2021-09-10 ENCOUNTER — Encounter: Payer: Self-pay | Admitting: Emergency Medicine

## 2021-09-10 ENCOUNTER — Other Ambulatory Visit: Payer: Self-pay

## 2021-09-10 ENCOUNTER — Emergency Department: Payer: BLUE CROSS/BLUE SHIELD

## 2021-09-10 DIAGNOSIS — R200 Anesthesia of skin: Secondary | ICD-10-CM | POA: Diagnosis not present

## 2021-09-10 DIAGNOSIS — R202 Paresthesia of skin: Secondary | ICD-10-CM | POA: Diagnosis not present

## 2021-09-10 DIAGNOSIS — E119 Type 2 diabetes mellitus without complications: Secondary | ICD-10-CM | POA: Diagnosis not present

## 2021-09-10 DIAGNOSIS — I110 Hypertensive heart disease with heart failure: Secondary | ICD-10-CM | POA: Insufficient documentation

## 2021-09-10 DIAGNOSIS — R42 Dizziness and giddiness: Secondary | ICD-10-CM | POA: Insufficient documentation

## 2021-09-10 DIAGNOSIS — X58XXXA Exposure to other specified factors, initial encounter: Secondary | ICD-10-CM | POA: Insufficient documentation

## 2021-09-10 DIAGNOSIS — J45909 Unspecified asthma, uncomplicated: Secondary | ICD-10-CM | POA: Diagnosis not present

## 2021-09-10 DIAGNOSIS — M79604 Pain in right leg: Secondary | ICD-10-CM | POA: Diagnosis not present

## 2021-09-10 DIAGNOSIS — I25110 Atherosclerotic heart disease of native coronary artery with unstable angina pectoris: Secondary | ICD-10-CM | POA: Diagnosis not present

## 2021-09-10 DIAGNOSIS — S5011XA Contusion of right forearm, initial encounter: Secondary | ICD-10-CM | POA: Insufficient documentation

## 2021-09-10 DIAGNOSIS — S60211A Contusion of right wrist, initial encounter: Secondary | ICD-10-CM | POA: Insufficient documentation

## 2021-09-10 DIAGNOSIS — M79601 Pain in right arm: Secondary | ICD-10-CM | POA: Diagnosis not present

## 2021-09-10 DIAGNOSIS — I5022 Chronic systolic (congestive) heart failure: Secondary | ICD-10-CM | POA: Diagnosis not present

## 2021-09-10 DIAGNOSIS — R2231 Localized swelling, mass and lump, right upper limb: Secondary | ICD-10-CM | POA: Diagnosis not present

## 2021-09-10 DIAGNOSIS — M7989 Other specified soft tissue disorders: Secondary | ICD-10-CM | POA: Insufficient documentation

## 2021-09-10 DIAGNOSIS — S6991XA Unspecified injury of right wrist, hand and finger(s), initial encounter: Secondary | ICD-10-CM | POA: Diagnosis not present

## 2021-09-10 NOTE — ED Triage Notes (Signed)
Patient ambulatory to triage with steady gait, without difficulty or distress noted; pt reports stent placed Monday in rt arm; c/o pain & swelling since; u/s performed before d/c and told he had a hematoma but st swelling has increased; currently taking ASA and Brilinta ?

## 2021-09-11 ENCOUNTER — Emergency Department
Admission: EM | Admit: 2021-09-11 | Discharge: 2021-09-11 | Disposition: A | Discharge status: Home / Self Care | Payer: BLUE CROSS/BLUE SHIELD | Attending: Emergency Medicine | Admitting: Emergency Medicine

## 2021-09-11 ENCOUNTER — Emergency Department: Payer: BLUE CROSS/BLUE SHIELD

## 2021-09-11 DIAGNOSIS — R600 Localized edema: Secondary | ICD-10-CM

## 2021-09-11 DIAGNOSIS — M79601 Pain in right arm: Secondary | ICD-10-CM

## 2021-09-11 DIAGNOSIS — Z9889 Other specified postprocedural states: Secondary | ICD-10-CM

## 2021-09-11 DIAGNOSIS — M79604 Pain in right leg: Secondary | ICD-10-CM | POA: Diagnosis not present

## 2021-09-11 DIAGNOSIS — M7989 Other specified soft tissue disorders: Secondary | ICD-10-CM | POA: Diagnosis not present

## 2021-09-11 NOTE — ED Notes (Signed)
Per Dr. Starleen Blue to hold on blood work at this time. ?

## 2021-09-11 NOTE — ED Notes (Signed)
Pt to ED for arm swelling that started yesterday. Pt had a cardiac cath placed on Monday, incision made to R wrist. Pt states his fingers have been tingling and he has noticed increased arm swelling. Pt on blood thinners. R forearm tender upon palpation Pt denies CP, SOB ?

## 2021-09-11 NOTE — ED Provider Notes (Signed)
? ?[facility] ?Provider Note ? ? ? Event Date/Time  ? First MD Initiated Contact with Patient 09/11/21 0157   ?  (approximate) ? ? ?History  ? ?Arm Swelling ? ? ?HPI ? ?Brett Marshall is a 56 y.o. male  with pmh CAD, HFrEF, HTN, pulmonary HTN who presents with right arm swelling.patient underwent cardiac cath on 4/24 for chest pain.  The right ulnar artery was accessed.  After the procedure he had some increasing swelling and ultrasound was obtained which did not show any evidence of pseudoaneurysm but there was a superficial hematoma.  It was noted in the note from 4/25 that his sensation in the fingers was intact.  Patient tells me that the numbness started in the hospital.  Since discharge the swelling has increased.  Has also had some mild dizziness and lightheadedness. ? ?  ? ?Past Medical History:  ?Diagnosis Date  ? Asthma   ? CAD (coronary artery disease)   ? a. 08/2017 Cath: LM nl, lAD 30ost, 67m 30d, RI min irregs, LCX min irregs, RCA small, 40p, EF 45-50%, diff HK.  ? DDD (degenerative disc disease), lumbar   ? Depression   ? Diabetes mellitus without complication (HErwin   ? Essential hypertension, benign   ? HFrEF (heart failure with reduced ejection fraction) (H[place]ur[place]   ? a. 08/2017 LV gram: EF 45-50%, glob HK.  ? Leg swelling   ? Left  ? Low back pain   ? Morbid obesity (HRotonda   ? NICM (nonischemic cardiomyopathy) ([place]   ? a. 08/2017 LV gram: EF 45-50%, global HK.  ? OSA on CPAP   ? non compliant  ? Pulmonary hypertension (HTam[place]   ? ? ?Patient Active Problem List  ? Diagnosis Date Noted  ? Spontaneous hematoma of forearm   ? Unstable angina (HCC)   ? Chest pain 09/05/2021  ? Controlled type 2 diabetes mellitus without complication, without long-term current use of insulin (HMart[place] 09/05/2021  ? Dyspnea 10/21/2020  ? Hypercholesterolemia 06/05/2019  ? Encounter for screening colonoscopy   ? Polyp of sigmoid colon   ? Gastroesophageal reflux disease without esophagitis 04/10/2018  ? OSA on  CPAP 04/10/2018  ? Chronic systolic congestive heart failure (HHemlock 12/09/2017  ? Pulmonary hypertension (HR[place] 12/09/2017  ? NASH (nonalcoholic steatohepatitis) 11/07/2017  ? Essential hypertension 09/13/2016  ? DISH (diffuse idiopathic skeletal hyperostosis) 05/17/2016  ? DDD (degenerative disc disease), lumbar 05/17/2016  ? Type 2 diabetes mellitus without complication, without long-term current use of insulin (HMar[place] 02/08/2016  ? Gynecomastia 02/08/2016  ? Morbid obesity (HLamar 02/08/2016  ? ? ? ?Physical Exam  ?Triage Vital Signs: ?ED Triage Vitals  ?Enc Vitals Group  ?   BP 09/10/21 2306 103/69  ?   Pulse Rate 09/10/21 2306 88  ?   Resp 09/10/21 2306 18  ?   Temp 09/10/21 2306 98 ?F (36.7 ?C)  ?   Temp Source 09/10/21 2306 Oral  ?   SpO2 09/10/21 2306 95 %  ?   Weight 09/10/21 2303 (!) 320 lb (145.2 kg)  ?   Height 09/10/21 2303 5' 8"  (1.727 m)  ?   Head Circumference --   ?   Peak Flow --   ?   Pain Score 09/10/21 2303 2  ?   Pain Loc --   ?   Pain Edu? --   ?   Excl. in [place] --   ? ? ?Most recent vital signs: ?Vitals:  ? 09/11/21 0219 09/11/21  0423  ?BP: 93/67 100/62  ?Pulse: 92 94  ?Resp: 18 16  ?Temp:    ?SpO2: 96% 97%  ? ? ? ?General: Awake, no distress.  ?CV:  Good peripheral perfusion.  ?Resp:  Normal effort.  ?Abd:  No distention.  ?Neuro:             Awake, Alert, Oriented x 3  ?Other:  Right arm with ecchymosis of the wrist and forearm which is mildly swollen, compartment is soft, unable to palpate radial pulse bilaterally, decreased sensation subjectively to light touch over the first dorsal webspace, index finger and fifth digit ?Cap refill less than 2 seconds in the fingers ?The hand is warm ? ? ? ?ED Results / Procedures / Treatments  ?Labs ?(all labs ordered are listed, but only abnormal results are displayed) ?Labs Reviewed - No data to display ? ? ?EKG ? ? ? ? ?RADIOLOGY ?I reviewed the arterial ultrasound of the right arm which does not show any pseudoaneurysm ? ? ?PROCEDURES: ? ?Critical Care  performed: No ? ?Procedures ? ?The patient is on the cardiac monitor to evaluate for evidence of arrhythmia and/or significant heart rate changes. ? ? ?MEDICATIONS ORDERED IN ED: ?Medications - No data to display ? ? ?IMPRESSION / MDM / ASSESSMENT AND PLAN / ED COURSE  ?I reviewed the triage vital signs and the nursing notes. ?             ?               ? ?Differential diagnosis includes, but is not limited to, postoperative hematoma, pseudoaneurysm, compartment syndrome ? ?Patient is a 56 year old male presents with right arm swelling and pain after recent cardiac cath performed 3 days ago.  Ulnar artery was the access point.  He had developed swelling and pain after the procedure with some numbness.  Ultrasound at that time did not show evidence of pseudoaneurysm but there was significant soft tissue swelling.  Decision was made to manage conservatively.  Patient endorses ongoing numbness and tingling as well as worsening of the swelling which is why he presents tonight.  On exam there is ecchymosis over the right forearm and wrist, unable to palpate radial pulses on either side but the hand has good cap refill and is warm appears well perfused.  Does have subjective decrease sensation in median radial and ulnar distribution.  I repeated the ultrasound to ensure no pseudoaneurysm this is negative.  Plan to discuss with on-call cardiology to see if there are have any additional recommendations. ? ? ?Spoke with Dr. Acie Fredrickson with cardiology who agrees that patient is okay for discharge given no concern for vascular compromise to the hand.  Likely having compression of the nerves due to overlying swelling.  He is sent Dr. Cicero Duck a message and Dr. Cicero Duck is in the office today so I encouraged the patient to give them a phone call so that he can be seen. ? ?FINAL CLINICAL IMPRESSION(S) / ED DIAGNOSES  ? ?Final diagnoses:  ?Arm swelling  ? ? ? ?Rx / DC Orders  ? ?ED Discharge Orders   ? ? None  ? ?  ? ? ? ?Note:  This  document was prepared using Dragon voice recognition software and may include unintentional dictation errors. ?  ?Rada Hay, MD ?09/11/21 [phone] ? ?

## 2021-09-11 NOTE — Discharge Instructions (Signed)
Please call Dr. Tyrell Antonio office today so that you can be seen for repeat evaluation of your arm swelling. ?

## 2021-09-16 ENCOUNTER — Encounter: Payer: Self-pay | Admitting: Radiology

## 2021-09-16 ENCOUNTER — Encounter: Payer: Self-pay | Admitting: Nurse Practitioner

## 2021-09-16 ENCOUNTER — Emergency Department
Admission: EM | Admit: 2021-09-16 | Discharge: 2021-09-16 | Disposition: A | Discharge status: Home / Self Care | Payer: BLUE CROSS/BLUE SHIELD | Attending: Emergency Medicine | Admitting: Emergency Medicine

## 2021-09-16 ENCOUNTER — Ambulatory Visit: Payer: BLUE CROSS/BLUE SHIELD | Admitting: Nurse Practitioner

## 2021-09-16 ENCOUNTER — Emergency Department: Payer: BLUE CROSS/BLUE SHIELD

## 2021-09-16 ENCOUNTER — Other Ambulatory Visit
Admission: RE | Admit: 2021-09-16 | Discharge: 2021-09-16 | Disposition: A | Discharge status: Home / Self Care | Payer: BLUE CROSS/BLUE SHIELD | Source: Ambulatory Visit | Attending: Nurse Practitioner | Admitting: Nurse Practitioner

## 2021-09-16 ENCOUNTER — Other Ambulatory Visit: Payer: Self-pay

## 2021-09-16 VITALS — BP 93/67 | HR 106 | Ht 68.0 in | Wt 325.1 lb

## 2021-09-16 DIAGNOSIS — E785 Hyperlipidemia, unspecified: Secondary | ICD-10-CM | POA: Diagnosis not present

## 2021-09-16 DIAGNOSIS — R000 Tachycardia, unspecified: Secondary | ICD-10-CM | POA: Insufficient documentation

## 2021-09-16 DIAGNOSIS — I110 Hypertensive heart disease with heart failure: Secondary | ICD-10-CM | POA: Insufficient documentation

## 2021-09-16 DIAGNOSIS — I5022 Chronic systolic (congestive) heart failure: Secondary | ICD-10-CM | POA: Insufficient documentation

## 2021-09-16 DIAGNOSIS — E875 Hyperkalemia: Secondary | ICD-10-CM | POA: Insufficient documentation

## 2021-09-16 DIAGNOSIS — I951 Orthostatic hypotension: Secondary | ICD-10-CM

## 2021-09-16 DIAGNOSIS — I25110 Atherosclerotic heart disease of native coronary artery with unstable angina pectoris: Secondary | ICD-10-CM

## 2021-09-16 DIAGNOSIS — N179 Acute kidney failure, unspecified: Secondary | ICD-10-CM | POA: Insufficient documentation

## 2021-09-16 DIAGNOSIS — I2510 Atherosclerotic heart disease of native coronary artery without angina pectoris: Secondary | ICD-10-CM | POA: Diagnosis not present

## 2021-09-16 DIAGNOSIS — R0602 Shortness of breath: Secondary | ICD-10-CM | POA: Diagnosis not present

## 2021-09-16 DIAGNOSIS — E119 Type 2 diabetes mellitus without complications: Secondary | ICD-10-CM | POA: Diagnosis not present

## 2021-09-16 DIAGNOSIS — R079 Chest pain, unspecified: Secondary | ICD-10-CM | POA: Insufficient documentation

## 2021-09-16 DIAGNOSIS — R0789 Other chest pain: Secondary | ICD-10-CM | POA: Insufficient documentation

## 2021-09-16 DIAGNOSIS — I509 Heart failure, unspecified: Secondary | ICD-10-CM | POA: Diagnosis not present

## 2021-09-16 DIAGNOSIS — J45909 Unspecified asthma, uncomplicated: Secondary | ICD-10-CM | POA: Insufficient documentation

## 2021-09-16 DIAGNOSIS — I208 Other forms of angina pectoris: Secondary | ICD-10-CM | POA: Diagnosis not present

## 2021-09-16 DIAGNOSIS — I2699 Other pulmonary embolism without acute cor pulmonale: Secondary | ICD-10-CM | POA: Diagnosis not present

## 2021-09-16 DIAGNOSIS — I200 Unstable angina: Secondary | ICD-10-CM

## 2021-09-16 DIAGNOSIS — R0609 Other forms of dyspnea: Secondary | ICD-10-CM | POA: Diagnosis not present

## 2021-09-16 LAB — CBC
HCT: 46 % (ref 39.0–52.0)
Hemoglobin: 14.8 g/dL (ref 13.0–17.0)
MCH: 27.4 pg (ref 26.0–34.0)
MCHC: 32.2 g/dL (ref 30.0–36.0)
MCV: 85.2 fL (ref 80.0–100.0)
Platelets: 274 10*3/uL (ref 150–400)
RBC: 5.4 MIL/uL (ref 4.22–5.81)
RDW: 14.7 % (ref 11.5–15.5)
WBC: 13 10*3/uL — ABNORMAL HIGH (ref 4.0–10.5)
nRBC: 0 % (ref 0.0–0.2)

## 2021-09-16 LAB — D-DIMER, QUANTITATIVE: D-Dimer, Quant: 2.61 ug/mL-FEU — ABNORMAL HIGH (ref 0.00–0.50)

## 2021-09-16 LAB — BASIC METABOLIC PANEL
Anion gap: 10 (ref 5–15)
BUN: 28 mg/dL — ABNORMAL HIGH (ref 6–20)
CO2: 25 mmol/L (ref 22–32)
Calcium: 9.4 mg/dL (ref 8.9–10.3)
Chloride: 100 mmol/L (ref 98–111)
Creatinine, Ser: 1.41 mg/dL — ABNORMAL HIGH (ref 0.61–1.24)
GFR, Estimated: 58 mL/min — ABNORMAL LOW (ref >60)
Glucose, Bld: 211 mg/dL — ABNORMAL HIGH (ref 70–99)
Potassium: 5.3 mmol/L — ABNORMAL HIGH (ref 3.5–5.1)
Sodium: 135 mmol/L (ref 135–145)

## 2021-09-16 LAB — TROPONIN I (HIGH SENSITIVITY): Troponin I (High Sensitivity): 6 ng/L (ref <18)

## 2021-09-16 LAB — MAGNESIUM: Magnesium: 1.8 mg/dL (ref 1.7–2.4)

## 2021-09-16 LAB — BRAIN NATRIURETIC PEPTIDE: B Natriuretic Peptide: 22.4 pg/mL (ref 0.0–100.0)

## 2021-09-16 MED ORDER — SODIUM CHLORIDE 0.9 % IV BOLUS
1000.0000 mL | Freq: Once | INTRAVENOUS | Status: AC
  Administered 2021-09-16: 1000 mL via INTRAVENOUS

## 2021-09-16 MED ORDER — CARVEDILOL 12.5 MG PO TABS: 18.7500 mg | ORAL_TABLET | Freq: Two times a day (BID) | ORAL | 3 refills | Status: DC

## 2021-09-16 MED ORDER — IOHEXOL 350 MG/ML SOLN
75.0000 mL | Freq: Once | INTRAVENOUS | Status: AC | PRN
Start: 2021-09-16 — End: 2021-09-16
  Administered 2021-09-16: 75 mL via INTRAVENOUS

## 2021-09-16 NOTE — ED Triage Notes (Signed)
Pt here with CHF and abnormal labs. Pt was told to come to ED by cardiology. Pt having chest tightness and Sob. Pt denies pain. ?

## 2021-09-16 NOTE — ED Provider Notes (Signed)
? ?[facility] ?Provider Note ? ? ? Event Date/Time  ? First MD Initiated Contact with Patient 09/16/21 1514   ?  (approximate) ? ? ?History  ? ?Chest Pain ? ? ?HP ? ?Brett Marshall is a 56 y.o. male with pmh coronary artery disease, chronic heart failure with improved ejection fraction obesity, hypertension, hyperlipidemia, diabetes sleep apnea presents with chest pain. ? ?Patient was recently admitted with chest pain had PCI with drug-eluting stent to the ostial LAD via right ulnar approach and had improvement in his exertional symptoms.  Patient followed up with cardiology today and was noted to be having ongoing chest tightness worse with exertion, to what he experienced prior to having his stent placed.  In the office his EKG did not show any acute ST or T wave changes, labs were notable for a normal high-sensitivity troponin but elevated D-dimer mild hypokalemia and AKI with creatinine 1.4.  He was referred to the ED for CTA.  His enalapril and carvedilol were decreased. ? ?Patient tells me that over the last 4 days he has been having exertional chest tightness.  Starts relief from the beginning of walking takes several minutes to stop after he sits down.  Prior to coming to the hospital with NSTEMI he had chest tightness associated with significant chest pain.  Pain part is not there though he does have some exertional dyspnea.  Also feeling lightheaded when stands up.  Denies cough congestion fevers chills nausea vomiting or abdominal pain.  No lower extremity edema or pain with urination. ? ?  ? ?Past Medical History:  ?Diagnosis Date  ? Asthma   ? CAD (coronary artery disease)   ? a. 08/2017 Cath: LM nl, LAD 30ost, 38m 30d, RI min irregs, LCX min irregs, RCA small, 40p, EF 45-50%, diff HK; b. 08/2021 MV: Ant/apical ishemia; c. 08/2021 PCI: LM nl, LAD 95ost (4.0x15 Onyx Frontier DES), 238m30d, D2 min irregs, RI/LCX min irregs, RCA 50p, EF 55-65%.  ? DDD (degenerative disc disease),  lumbar   ? Depression   ? Diabetes mellitus without complication (HCGrosse Pointe  ? Essential hypertension, benign   ? HFimpEF (heart failure with improved ejection fraction) (HCKlondike  ? a. 08/2017 LV gram: EF 45-50%, glob HK; b. 08/2021 Echo: EF 60-65%, no rwma, GrI DD, nl RV fxn.  ? Leg swelling   ? Left  ? Low back pain   ? Mixed ICM & NICM   ? a. 08/2017 LV gram: EF 45-50%, glob HK; b. 08/2021 Echo: EF 60-65%.  ? Morbid obesity (HCLawndale  ? OSA on CPAP   ? non compliant  ? Pulmonary hypertension (HCMasury  ? ? ?Patient Active Problem List  ? Diagnosis Date Noted  ? Spontaneous hematoma of forearm   ? Unstable angina (HCC)   ? Chest pain 09/05/2021  ? Controlled type 2 diabetes mellitus without complication, without long-term current use of insulin (HCOntario04/21/2023  ? Dyspnea 10/21/2020  ? Hypercholesterolemia 06/05/2019  ? Encounter for screening colonoscopy   ? Polyp of sigmoid colon   ? Gastroesophageal reflux disease without esophagitis 04/10/2018  ? OSA on CPAP 04/10/2018  ? Chronic systolic congestive heart failure (HCSpiceland07/25/2019  ? Pulmonary hypertension (HCKeosauqua07/25/2019  ? NASH (nonalcoholic steatohepatitis) 11/07/2017  ? Essential hypertension 09/13/2016  ? DISH (diffuse idiopathic skeletal hyperostosis) 05/17/2016  ? DDD (degenerative disc disease), lumbar 05/17/2016  ? Type 2 diabetes mellitus without complication, without long-term current use of insulin (HCVinita Park09/23/2017  ?  Gynecomastia 02/08/2016  ? Morbid obesity ([place]) 02/08/2016  ? ? ? ?Physical Exam  ?Triage Vital Signs: ?ED Triage Vitals  ?Enc Vitals Group  ?   BP 09/16/21 1140 115/79  ?   Pulse Rate 09/16/21 1137 (!) 104  ?   Resp 09/16/21 1137 18  ?   Temp 09/16/21 1137 97.9 ?F (36.6 ?C)  ?   Temp Source 09/16/21 1137 Oral  ?   SpO2 09/16/21 1137 99 %  ?   Weight 09/16/21 1139 (!) 325 lb (147.4 kg)  ?   Height 09/16/21 1139 5' 8"  (1.727 m)  ?   Head Circumference --   ?   Peak Flow --   ?   Pain Score 09/16/21 1139 4  ?   Pain Loc --   ?   Pain Edu? --   ?    Excl. in [place]? --   ? ? ?Most recent vital signs: ?Vitals:  ? 09/16/21 1137 09/16/21 1140  ?BP:  115/79  ?Pulse: (!) 104   ?Resp: 18   ?Temp: 97.9 ?F (36.6 ?C)   ?SpO2: 99%   ? ? ? ?General: Awake, no distress.  ?CV:  Good peripheral perfusion. No LE edema ?Resp:  Normal effort. Lungs are clear ?Abd:  No distention. soft ?Neuro:             Awake, Alert, Oriented x 3  ?Other:   ? ? ?ED Results / Procedures / Treatments  ?Labs ?(all labs ordered are listed, but only abnormal results are displayed) ?Labs Reviewed  ?BRAIN NATRIURETIC PEPTIDE  ?MAGNESIUM  ? ? ? ?EKG ? ?EKG interpreted by myself shows sinus tachycardia, low voltage no acute ischemic changes ? ? ?RADIOLOGY ?I reviewed the CTA which is negative for pulmonary embolism or other acute process ? ? ?PROCEDURES: ? ?Critical Care performed: No ? ?.1-3 Lead EKG Interpretation ?Performed by: Rada Hay, MD ?Authorized by: Rada Hay, MD  ? ?  Interpretation: normal   ?  ECG rate assessment: normal   ?  Ectopy: none   ?  Conduction: normal   ? ?The patient is on the cardiac monitor to evaluate for evidence of arrhythmia and/or significant heart rate changes. ? ? ?MEDICATIONS ORDERED IN ED: ?Medications  ?iohexol (OMNIPAQUE) 350 MG/ML injection 75 mL (75 mLs Intravenous Contrast Given 09/16/21 1452)  ? ? ? ?IMPRESSION / MDM / ASSESSMENT AND PLAN / ED COURSE  ?I reviewed the triage vital signs and the nursing notes. ?             ?               ? ?Differential diagnosis includes, but is not limited to, pulmonary embolism, unstable angina, stable angina ? ?Patient is a 56 year old male with recent PCI to the LAD who presents from cardiology clinic for evaluation of pulmonary embolism.  Over the last 4 days has had exertional chest tightness but no chest pain.  Was also found to be somewhat orthostatic with AKI on outpatient labs and his blood pressure medication has been adjusted.  A D-dimer was sent from clinic and this is elevated at 2.6 and thus he was  referred to the ED for CTA.  Patient's vital signs are notable for mild tachycardia normal blood pressure otherwise within normal limits.  He appears well is in no acute distress no lower extremity edema.  CTA obtained is negative for acute PE or other acute process.  I reviewed his EKG which does  not show any ischemic changes. ? ?I reviewed the rest of the patient's labs from today.  Does have mild AKI and mild hyperkalemia with a potassium of 5.3 and his enalaprilat was discontinued which I agree with.  His blood pressure is okay in the ED today.  Per cardiology note, if work-up in the ED is negative patient can be seen as an outpatient.  I agree with plan for cardiology follow-up.  Discussed with the patient that if he has pain that is worsening or changing does not improve with rest that he return to the ED immediately. ? ?  ? ? ?FINAL CLINICAL IMPRESSION(S) / ED DIAGNOSES  ? ?Final diagnoses:  ?Chest pain, unspecified type  ? ? ? ?Rx / DC Orders  ? ?ED Discharge Orders   ? ? None  ? ?  ? ? ? ?Note:  This document was prepared using Dragon voice recognition software and may include unintentional dictation errors. ?  ?Rada Hay, MD ?09/16/21 1601 ? ?

## 2021-09-16 NOTE — Patient Instructions (Signed)
Medication Instructions:  ?Your physician has recommended you make the following change in your medication:  ? ?STOP Enalapril ?INCREASE Carvedilol to 1 & 1/2 tablet (18.75 mg) twice a day ? ?*If you need a refill on your cardiac medications before your next appointment, please call your pharmacy* ? ? ?Lab Work: ?CBC, BMET, Troponin, and D-Dimer today over at the [facility]. Check in at registration and they will direct you where to go.  ? ? ?If you have labs (blood work) drawn today and your tests are completely normal, you will receive your results only by: ?MyChart Message (if you have MyChart) OR ?A paper copy in the mail ?If you have any lab test that is abnormal or we need to change your treatment, we will call you to review the results. ? ? ?Testing/Procedures: ?None ? ? ?Follow-Up: ?At [facility], you and your health needs are our priority.  As part of our continuing mission to provide you with exceptional heart care, we have created designated Provider Care Teams.  These Care Teams include your primary Cardiologist (physician) and Advanced Practice Providers (APPs -  Physician Assistants and Nurse Practitioners) who all work together to provide you with the care you need, when you need it. ? ? ?Your next appointment:   ?1 week(s) ? ?The format for your next appointment:   ?In Person ? ?Provider:   ?[name], MD  ? ? ? ? ? ?Important Information About Sugar ? ? ? ? ?  ?

## 2021-09-16 NOTE — Progress Notes (Signed)
? ? ?Office Visit  ?  ?Patient Name: Brett Marshall ?Date of Encounter: 09/16/2021 ? ?Primary Care Provider:  [name], MD ?Primary Cardiologist:  [name], MD ? ?Chief Complaint  ?  ?56 year old male with a history of CAD, chronic heart failure with improved ejection fraction, mixed ischemic and nonischemic cardiomyopathy, LV outflow tract gradient, obesity, hyperlipidemia, diabetes, and sleep apnea, who presents for follow-up after recent hospitalization for chest pain and drug-eluting stent placement to the ostial LAD. ? ?Past Medical History  ?  ?Past Medical History:  ?Diagnosis Date  ? Asthma   ? CAD (coronary artery disease)   ? a. 08/2017 Cath: LM nl, LAD 30ost, 65m 30d, RI min irregs, LCX min irregs, RCA small, 40p, EF 45-50%, diff HK; b. 08/2021 MV: Ant/apical ishemia; c. 08/2021 PCI: LM nl, LAD 95ost (4.0x15 Onyx Frontier DES), 218m30d, D2 min irregs, RI/LCX min irregs, RCA 50p, EF 55-65%.  ? DDD (degenerative disc disease), lumbar   ? Depression   ? Diabetes mellitus without complication (HCFarmington  ? Essential hypertension, benign   ? HFimpEF (heart failure with improved ejection fraction) (HCPoint of Rocks  ? a. 08/2017 LV gram: EF 45-50%, glob HK; b. 08/2021 Echo: EF 60-65%, no rwma, GrI DD, nl RV fxn.  ? Leg swelling   ? Left  ? Low back pain   ? Mixed ICM & NICM   ? a. 08/2017 LV gram: EF 45-50%, glob HK; b. 08/2021 Echo: EF 60-65%.  ? Morbid obesity (HCDover  ? OSA on CPAP   ? non compliant  ? Pulmonary hypertension (HCPaden  ? ?Past Surgical History:  ?Procedure Laterality Date  ? CARDIAC CATHETERIZATION    ? [place] no stents.  ? COLONOSCOPY WITH PROPOFOL N/A 04/11/2019  ? Procedure: COLONOSCOPY WITH PROPOFOL;  Surgeon: WoLucilla LameMD;  [facility];  Service: Endoscopy;  Laterality: N/A;  ? CORONARY STENT INTERVENTION N/A 09/08/2021  ? Procedure: CORONARY STENT INTERVENTION;  Surgeon: ArWellington HampshireMD;  [place] [place];  Service: Cardiovascular;  Laterality: N/A;  ?  INTRAVASCULAR ULTRASOUND/IVUS N/A 09/08/2021  ? Procedure: Intravascular Ultrasound/IVUS;  Surgeon: ArWellington HampshireMD;  [facility];  Service: Cardiovascular;  Laterality: N/A;  ? LEFT HEART CATH AND CORONARY ANGIOGRAPHY N/A 09/08/2021  ? Procedure: LEFT HEART CATH AND CORONARY ANGIOGRAPHY;  Surgeon: ArWellington HampshireMD;  [place];  Service: Cardiovascular;  Laterality: N/A;  ? RIGHT/LEFT HEART CATH AND CORONARY ANGIOGRAPHY N/A 09/02/2017  ? Procedure: RIGHT/LEFT HEART CATH AND CORONARY ANGIOGRAPHY;  Surgeon: ArWellington HampshireMD;  [facility];  Service: Cardiovascular;  Laterality: N/A;  ? ? ?Allergies ? ?No Known Allergies ? ?History of Present Illness  ?  ?[age]ear old male with above complex past medical history including CAD, chronic heart failure with improved ejection fraction, mixed ischemic and nonischemic cardiomyopathy, LV outflow tract gradient, obesity, hyperlipidemia, diabetes, and sleep apnea.  He previously underwent diagnostic catheterization in April 2019, which showed mild, nonobstructive CAD with an EF of 45 to 50%.  He had mildly elevated pulmonary filling pressures and he was subsequently referred to pulmonology and has since been treated for sleep apnea.  [facility] setting of a 10-day history of constant chest pain that was somewhat reproducible with palpation but also worse with exertion.  Troponins were normal.  Stress testing was undertaken and showed anterior and apical  ischemia.  This was followed by diagnostic catheterization which showed a 95% stenosis in the ostial LAD, and otherwise nonobstructive disease.  He underwent successful PCI and drug-eluting stent placement to the ostial LAD via a right ulnar approach, with improvement in exertional symptoms.  Post PCI, he did have swelling and pain of his right forearm, with paresthesias of the fingers on his right hand.   Ultrasound was undertaken and showed a hematoma within the superficial soft tissues of the right upper extremity however, there was no evidence of pseudoaneurysm or AV fistula.  He was discharged home on April 25, but return to the emergency department on April 27, due to ongoing right forearm swelling and ecchymosis.  Repeat ultrasound again showed soft tissue hematoma, measuring up to 5.1 cm, which was largely unchanged, and without evidence of pseudoaneurysm or AV fistula.  Patient was discharged home. ? ?Patient notes that about 2 days after hospital discharge, he began experiencing recurrent constant, low-level chest tightness that worsens with exertion.  At this point, walking just 10 feet or so results in worsening chest tightness and dyspnea.  He identifies that the symptoms are similar to what he experienced prior to his hospitalization.  He notes compliance with all of his medications.  He has also been experiencing intermittent lightheadedness, especially with position changes.  Today, he is mildly tachycardic and orthostatic.  ECG was without acute ST or T changes.  He denies palpitations, PND, orthopnea, syncope, edema, or early satiety.  He continues to experience right forearm pain, tenderness, and swelling, though all have improved slightly since his most recent ED visit. ? ?Home Medications  ?  ?Current Outpatient Medications  ?Medication Sig Dispense Refill  ? albuterol (VENTOLIN HFA) 108 (90 Base) MCG/ACT inhaler Inhale 1-2 puffs into the lungs every 6 (six) hours as needed for wheezing or shortness of breath. 18 g 1  ? aspirin EC 81 MG EC tablet Take 1 tablet (81 mg total) by mouth daily. Swallow whole. 30 tablet 2  ? atorvastatin (LIPITOR) 40 MG tablet Take 1 tablet (40 mg total) by mouth daily at 6 PM. 30 tablet 2  ? cyclobenzaprine (FLEXERIL) 10 MG tablet Take 1 tablet (10 mg total) by mouth 3 (three) times daily as needed.For spasms 90 tablet 5  ? DULoxetine (CYMBALTA) 30 MG capsule Take 2  capsules (60 mg total) by mouth daily.    ? famotidine (PEPCID) 40 MG tablet Take 40 mg by mouth daily.     ? furosemide (LASIX) 20 MG tablet Take 1 tablet (20 mg total) by mouth daily as needed (for SOB or weight gain of 3 lbs overnight). 90 tablet 3  ? glipiZIDE (GLUCOTROL) 5 MG tablet Take 1 tablet (5 mg total) by mouth 2 (two) times daily before a meal. 180 tablet 1  ? glucose blood (GE100 BLOOD GLUCOSE TEST) test strip Use as instructed 100 each 12  ? JARDIANCE 25 MG TABS tablet Take 25 mg by mouth daily.    ? Lancets (ONETOUCH ULTRASOFT) lancets Check sugar twice daily 100 each 12  ? metFORMIN (GLUCOPHAGE) 1000 MG tablet Take 1 tablet (1,000 mg total) by mouth 2 (two) times daily with a meal. 180 tablet 1  ? ticagrelor (BRILINTA) 90 MG TABS tablet Take 1 tablet (90 mg total) by mouth 2 (two) times daily. 60 tablet 0  ? carvedilol (COREG) 12.5 MG tablet Take 1.5 tablets (18.75 mg total) by mouth 2 (two) times daily. 270 tablet 3  ? ?No current facility-administered medications  for this visit.  ?  ? ?Review of Systems  ?  ?As outlined above, he has been experiencing constant, low-level chest tightness that is exacerbated with exertion, and becomes associated with dyspnea.  He also has a right forearm pain, swelling, and tenderness, which overall has improved slightly.  He has been experiencing intermittent lightheadedness and orthostatic symptoms.  He denies palpitations, PND, orthopnea, syncope, edema, or early satiety.  All other systems reviewed and are otherwise negative except as noted above. ? ? ?Cardiac Rehabilitation Eligibility Assessment  ?The patient is NOT ready to start cardiac rehabilitation due to: ?The patient is experiencing unstable angina. ?  ? ?Physical Exam  ?  ?VS:  BP 93/67 (BP Location: Left Arm, Patient Position: Sitting, Cuff Size: Large)   Pulse (!) 106   Ht 5' 8"  (1.727 m)   Wt (!) 325 lb 2 oz (147.5 kg)   SpO2 99%   BMI 49.44 kg/m?  , BMI Body mass index is 49.44 kg/m?. ?    Orthostatic VS for the past 24 hrs: ? BP- Lying Pulse- Lying BP- Sitting Pulse- Sitting BP- Standing at 0 minutes Pulse- Standing at 0 minutes  ?09/16/21 0833 137/83 105 98/64 104 114/75 92  ? ? ?GEN: Obese, in no acu

## 2021-09-16 NOTE — Discharge Instructions (Addendum)
Your CAT scan did not show any blood clot.  Please follow-up with cardiology next week.  If your chest pain is worsening does not go away with rest or is changing in someway, please return to the emergency department. ?

## 2021-09-17 ENCOUNTER — Observation Stay
Admission: EM | Admit: 2021-09-17 | Discharge: 2021-09-18 | Disposition: A | Discharge status: Home / Self Care | Payer: BLUE CROSS/BLUE SHIELD | Attending: Internal Medicine | Admitting: Internal Medicine

## 2021-09-17 ENCOUNTER — Emergency Department: Payer: BLUE CROSS/BLUE SHIELD

## 2021-09-17 ENCOUNTER — Other Ambulatory Visit: Payer: Self-pay

## 2021-09-17 ENCOUNTER — Encounter
Admission: EM | Disposition: A | Discharge status: Home / Self Care | Payer: Self-pay | Source: Home / Self Care | Attending: Emergency Medicine

## 2021-09-17 ENCOUNTER — Telehealth: Payer: Self-pay | Admitting: Nurse Practitioner

## 2021-09-17 DIAGNOSIS — Z7984 Long term (current) use of oral hypoglycemic drugs: Secondary | ICD-10-CM | POA: Diagnosis not present

## 2021-09-17 DIAGNOSIS — J45909 Unspecified asthma, uncomplicated: Secondary | ICD-10-CM | POA: Diagnosis not present

## 2021-09-17 DIAGNOSIS — R079 Chest pain, unspecified: Secondary | ICD-10-CM | POA: Diagnosis not present

## 2021-09-17 DIAGNOSIS — I10 Essential (primary) hypertension: Secondary | ICD-10-CM | POA: Diagnosis not present

## 2021-09-17 DIAGNOSIS — I509 Heart failure, unspecified: Secondary | ICD-10-CM

## 2021-09-17 DIAGNOSIS — I200 Unstable angina: Secondary | ICD-10-CM | POA: Diagnosis not present

## 2021-09-17 DIAGNOSIS — Z7982 Long term (current) use of aspirin: Secondary | ICD-10-CM | POA: Diagnosis not present

## 2021-09-17 DIAGNOSIS — Z79899 Other long term (current) drug therapy: Secondary | ICD-10-CM | POA: Insufficient documentation

## 2021-09-17 DIAGNOSIS — I110 Hypertensive heart disease with heart failure: Secondary | ICD-10-CM | POA: Insufficient documentation

## 2021-09-17 DIAGNOSIS — I5032 Chronic diastolic (congestive) heart failure: Secondary | ICD-10-CM | POA: Insufficient documentation

## 2021-09-17 DIAGNOSIS — I25110 Atherosclerotic heart disease of native coronary artery with unstable angina pectoris: Secondary | ICD-10-CM | POA: Diagnosis not present

## 2021-09-17 DIAGNOSIS — Z9989 Dependence on other enabling machines and devices: Secondary | ICD-10-CM

## 2021-09-17 DIAGNOSIS — R0609 Other forms of dyspnea: Secondary | ICD-10-CM

## 2021-09-17 DIAGNOSIS — E119 Type 2 diabetes mellitus without complications: Secondary | ICD-10-CM | POA: Diagnosis not present

## 2021-09-17 DIAGNOSIS — I272 Pulmonary hypertension, unspecified: Secondary | ICD-10-CM | POA: Diagnosis present

## 2021-09-17 DIAGNOSIS — I202 Refractory angina pectoris: Secondary | ICD-10-CM

## 2021-09-17 DIAGNOSIS — N179 Acute kidney failure, unspecified: Secondary | ICD-10-CM | POA: Insufficient documentation

## 2021-09-17 DIAGNOSIS — I421 Obstructive hypertrophic cardiomyopathy: Secondary | ICD-10-CM | POA: Insufficient documentation

## 2021-09-17 DIAGNOSIS — I259 Chronic ischemic heart disease, unspecified: Secondary | ICD-10-CM | POA: Diagnosis not present

## 2021-09-17 DIAGNOSIS — E785 Hyperlipidemia, unspecified: Secondary | ICD-10-CM

## 2021-09-17 DIAGNOSIS — Z7901 Long term (current) use of anticoagulants: Secondary | ICD-10-CM | POA: Diagnosis not present

## 2021-09-17 DIAGNOSIS — K7581 Nonalcoholic steatohepatitis (NASH): Secondary | ICD-10-CM | POA: Diagnosis present

## 2021-09-17 DIAGNOSIS — G4733 Obstructive sleep apnea (adult) (pediatric): Secondary | ICD-10-CM

## 2021-09-17 DIAGNOSIS — Z6841 Body Mass Index (BMI) 40.0 and over, adult: Secondary | ICD-10-CM | POA: Insufficient documentation

## 2021-09-17 HISTORY — PX: LEFT HEART CATH AND CORONARY ANGIOGRAPHY: CATH118249

## 2021-09-17 LAB — BRAIN NATRIURETIC PEPTIDE: B Natriuretic Peptide: 26.4 pg/mL (ref 0.0–100.0)

## 2021-09-17 LAB — CBC
HCT: 44.2 % (ref 39.0–52.0)
HCT: 41 % (ref 39.0–52.0)
Hemoglobin: 14.2 g/dL (ref 13.0–17.0)
Hemoglobin: 13.2 g/dL (ref 13.0–17.0)
MCH: 27.4 pg (ref 26.0–34.0)
MCH: 27 pg (ref 26.0–34.0)
MCHC: 32.1 g/dL (ref 30.0–36.0)
MCHC: 32.2 g/dL (ref 30.0–36.0)
MCV: 85.2 fL (ref 80.0–100.0)
MCV: 84 fL (ref 80.0–100.0)
Platelets: 271 10*3/uL (ref 150–400)
Platelets: 222 10*3/uL (ref 150–400)
RBC: 5.19 MIL/uL (ref 4.22–5.81)
RBC: 4.88 MIL/uL (ref 4.22–5.81)
RDW: 14.7 % (ref 11.5–15.5)
RDW: 14.7 % (ref 11.5–15.5)
WBC: 11.6 10*3/uL — ABNORMAL HIGH (ref 4.0–10.5)
WBC: 10.3 10*3/uL (ref 4.0–10.5)
nRBC: 0 % (ref 0.0–0.2)
nRBC: 0 % (ref 0.0–0.2)

## 2021-09-17 LAB — BASIC METABOLIC PANEL
Anion gap: 11 (ref 5–15)
BUN: 27 mg/dL — ABNORMAL HIGH (ref 6–20)
CO2: 23 mmol/L (ref 22–32)
Calcium: 9.3 mg/dL (ref 8.9–10.3)
Chloride: 102 mmol/L (ref 98–111)
Creatinine, Ser: 1.44 mg/dL — ABNORMAL HIGH (ref 0.61–1.24)
GFR, Estimated: 57 mL/min — ABNORMAL LOW (ref >60)
Glucose, Bld: 197 mg/dL — ABNORMAL HIGH (ref 70–99)
Potassium: 5 mmol/L (ref 3.5–5.1)
Sodium: 136 mmol/L (ref 135–145)

## 2021-09-17 LAB — GLUCOSE, CAPILLARY
Glucose-Capillary: 102 mg/dL — ABNORMAL HIGH (ref 70–99)
Glucose-Capillary: 98 mg/dL (ref 70–99)
Glucose-Capillary: 136 mg/dL — ABNORMAL HIGH (ref 70–99)

## 2021-09-17 LAB — CREATININE, SERUM
Creatinine, Ser: 1.17 mg/dL (ref 0.61–1.24)
GFR, Estimated: >60 mL/min (ref >60)

## 2021-09-17 LAB — TROPONIN I (HIGH SENSITIVITY)
Troponin I (High Sensitivity): 5 ng/L (ref <18)
Troponin I (High Sensitivity): 4 ng/L (ref <18)

## 2021-09-17 SURGERY — LEFT HEART CATH AND CORONARY ANGIOGRAPHY
Anesthesia: Moderate Sedation

## 2021-09-17 MED ORDER — LABETALOL HCL 5 MG/ML IV SOLN: 10.0000 mg | INTRAVENOUS | Status: AC | PRN

## 2021-09-17 MED ORDER — HEPARIN SODIUM (PORCINE) 1000 UNIT/ML IJ SOLN
INTRAMUSCULAR | Status: AC
  Filled 2021-09-17: qty 10

## 2021-09-17 MED ORDER — SODIUM CHLORIDE 0.9 % WEIGHT BASED INFUSION
3.0000 mL/kg/h | INTRAVENOUS | Status: DC
  Administered 2021-09-17: 3 mL/kg/h via INTRAVENOUS

## 2021-09-17 MED ORDER — ENOXAPARIN SODIUM 40 MG/0.4ML IJ SOSY
40.0000 mg | PREFILLED_SYRINGE | INTRAMUSCULAR | Status: DC
  Administered 2021-09-18: 40 mg via SUBCUTANEOUS
  Filled 2021-09-17: qty 0.4

## 2021-09-17 MED ORDER — SODIUM CHLORIDE 0.9 % IV SOLN: INTRAVENOUS | Status: AC

## 2021-09-17 MED ORDER — SODIUM CHLORIDE 0.9 % IV SOLN: 250.0000 mL | INTRAVENOUS | Status: DC | PRN

## 2021-09-17 MED ORDER — IOHEXOL 300 MG/ML  SOLN
INTRAMUSCULAR | Status: DC | PRN
  Administered 2021-09-17: 32 mL

## 2021-09-17 MED ORDER — FENTANYL CITRATE (PF) 100 MCG/2ML IJ SOLN
INTRAMUSCULAR | Status: AC
  Filled 2021-09-17: qty 2

## 2021-09-17 MED ORDER — SODIUM CHLORIDE 0.9 % IV SOLN: INTRAVENOUS | Status: DC

## 2021-09-17 MED ORDER — HEPARIN (PORCINE) IN NACL 2000-0.9 UNIT/L-% IV SOLN
INTRAVENOUS | Status: DC | PRN
  Administered 2021-09-17: 1000 mL

## 2021-09-17 MED ORDER — ACETAMINOPHEN 325 MG PO TABS
650.0000 mg | ORAL_TABLET | ORAL | Status: DC | PRN
  Administered 2021-09-18: 650 mg via ORAL
  Filled 2021-09-17: qty 2

## 2021-09-17 MED ORDER — ONDANSETRON HCL 4 MG/2ML IJ SOLN: 4.0000 mg | Freq: Four times a day (QID) | INTRAMUSCULAR | Status: DC | PRN

## 2021-09-17 MED ORDER — LIDOCAINE HCL (PF) 1 % IJ SOLN
INTRAMUSCULAR | Status: DC | PRN
  Administered 2021-09-17: 2 mL

## 2021-09-17 MED ORDER — FENTANYL CITRATE (PF) 100 MCG/2ML IJ SOLN
INTRAMUSCULAR | Status: DC | PRN
  Administered 2021-09-17 (×2): 25 ug via INTRAVENOUS

## 2021-09-17 MED ORDER — ASPIRIN 81 MG PO CHEW
81.0000 mg | CHEWABLE_TABLET | ORAL | Status: DC
Start: 2021-09-18 — End: 2021-09-17

## 2021-09-17 MED ORDER — SODIUM CHLORIDE 0.9 % WEIGHT BASED INFUSION: 1.0000 mL/kg/h | INTRAVENOUS | Status: DC

## 2021-09-17 MED ORDER — SODIUM CHLORIDE 0.9% FLUSH: 3.0000 mL | INTRAVENOUS | Status: DC | PRN

## 2021-09-17 MED ORDER — VERAPAMIL HCL 2.5 MG/ML IV SOLN
INTRAVENOUS | Status: AC
  Filled 2021-09-17: qty 2

## 2021-09-17 MED ORDER — MIDAZOLAM HCL 2 MG/2ML IJ SOLN
INTRAMUSCULAR | Status: AC
  Filled 2021-09-17: qty 2

## 2021-09-17 MED ORDER — MIDAZOLAM HCL 2 MG/2ML IJ SOLN
INTRAMUSCULAR | Status: DC | PRN
  Administered 2021-09-17 (×2): 1 mg via INTRAVENOUS

## 2021-09-17 MED ORDER — SODIUM CHLORIDE 0.9% FLUSH
3.0000 mL | Freq: Two times a day (BID) | INTRAVENOUS | Status: DC
  Administered 2021-09-17: 3 mL via INTRAVENOUS

## 2021-09-17 MED ORDER — HEPARIN (PORCINE) IN NACL 1000-0.9 UT/500ML-% IV SOLN
INTRAVENOUS | Status: DC | PRN
  Administered 2021-09-17: 500 mL

## 2021-09-17 MED ORDER — HEPARIN (PORCINE) IN NACL 1000-0.9 UT/500ML-% IV SOLN
INTRAVENOUS | Status: AC
  Filled 2021-09-17: qty 1000

## 2021-09-17 MED ORDER — ENOXAPARIN SODIUM 40 MG/0.4ML IJ SOSY: 40.0000 mg | PREFILLED_SYRINGE | INTRAMUSCULAR | Status: DC

## 2021-09-17 MED ORDER — HEPARIN SODIUM (PORCINE) 1000 UNIT/ML IJ SOLN
INTRAMUSCULAR | Status: DC | PRN
  Administered 2021-09-17: 5000 [IU] via INTRAVENOUS

## 2021-09-17 MED ORDER — LIDOCAINE HCL 1 % IJ SOLN
INTRAMUSCULAR | Status: AC
  Filled 2021-09-17: qty 20

## 2021-09-17 MED ORDER — VERAPAMIL HCL 2.5 MG/ML IV SOLN
INTRAVENOUS | Status: DC | PRN
  Administered 2021-09-17 (×2): 2.5 mg via INTRAVENOUS

## 2021-09-17 SURGICAL SUPPLY — 13 items
CATH INFINITI 5FR MULTPACK ANG (CATHETERS) ×1 IMPLANT
CATH LANGSTON DUAL LUM PIG 6FR (CATHETERS) ×1 IMPLANT
DEVICE RAD COMP TR BAND LRG (VASCULAR PRODUCTS) ×1 IMPLANT
DRAPE BRACHIAL (DRAPES) ×1 IMPLANT
GLIDESHEATH SLEND SS 6F .021 (SHEATH) ×1 IMPLANT
GUIDEWIRE INQWIRE 1.5J.035X260 (WIRE) IMPLANT
INQWIRE 1.5J .035X260CM (WIRE) ×2
KIT RIGHT HEART ACIST (MISCELLANEOUS) ×1 IMPLANT
PACK CARDIAC CATH (CUSTOM PROCEDURE TRAY) ×2 IMPLANT
PANNUS RETENTION SYSTEM 2 PAD (MISCELLANEOUS) ×1 IMPLANT
PROTECTION STATION PRESSURIZED (MISCELLANEOUS) ×2
SET ATX SIMPLICITY (MISCELLANEOUS) ×1 IMPLANT
STATION PROTECTION PRESSURIZED (MISCELLANEOUS) IMPLANT

## 2021-09-17 NOTE — Brief Op Note (Signed)
BRIEF CARDIAC CATHETERIZATION NOTE ? ?DATE: 09/17/2021 ? ?TIME: 3:22 PM ? ?PATIENT:  Brett Marshall  56 y.o. male ? ?PRE-OPERATIVE DIAGNOSIS:  Unstable angina ? ?POST-OPERATIVE DIAGNOSIS:  LVOT gradient ? ?PROCEDURE:  Procedure(s): ?LEFT HEART CATH AND CORONARY ANGIOGRAPHY (N/A) ? ?SURGEON:  Surgeon(s) and Role: ?   Nelva Bush, MD - Primary ? ?FINDINGS: ?No significant CAD.  Ostial/proximal LAD stent is widely patent. ?Normal left ventricular filling pressure. ?Significantly elevated LVOT gradient of up to 80 mmHg at rest and 110 mmHg with provocation (post-PVC).  Findings suggestive of HOCM. ? ?RECOMMENDATIONS: ?Gentle hydration and escalation of beta blockade for target resting heart rate of 60 bpm. ?Continue 12 months of DAPT from recent PCI. ?Aggressive secondary prevention of CAD. ?Consider outpatient cardiac MRI if not precluded by body habitus. ? ?Nelva Bush, MD ?[facility] ? ?

## 2021-09-17 NOTE — ED Provider Notes (Signed)
? ?[facility] ?Provider Note ? ? Event Date/Time  ? First MD Initiated Contact with Patient 09/17/21 [phone]   ?  (approximate) ?History  ?Chest Pain ? ?HPI ?Brett Marshall is a 56 y.o. male with a past medical history of CAD status post stent placement on 09/08/2021 who presents for persistent chest pain.  Patient states that since having this stent placed, he has continued to have exertional chest pain/pressure as well his tachycardia.  Patient was seen for the symptoms in cardiology clinic yesterday and sent to our emergency department for a CT angiography of the chest after an increased troponin and D-dimer.  This evaluation was negative for any acute abnormalities and patient was sent home with changes to medications including discontinuation of enalapril and increase of patient's carvedilol dosing.  Patient states that he feels like his blood pressure is in much better control however he continues to have exertional, substernal, nonradiating chest pain.  Patient does endorse associated dyspnea on exertion as well.  Patient currently denies any vision changes, tinnitus, difficulty speaking, facial droop, sore throat, abdominal pain, nausea/vomiting/diarrhea, dysuria, or weakness/numbness/paresthesias in any extremity ?Physical Exam  ?Triage Vital Signs: ?ED Triage Vitals  ?Enc Vitals Group  ?   BP 09/17/21 0925 110/82  ?   Pulse Rate 09/17/21 0925 (!) 102  ?   Resp 09/17/21 0925 20  ?   Temp 09/17/21 0925 97.8 ?F (36.6 ?C)  ?   Temp Source 09/17/21 0925 Oral  ?   SpO2 09/17/21 0925 100 %  ?   Weight --   ?   Height --   ?   Head Circumference --   ?   Peak Flow --   ?   Pain Score 09/17/21 0924 5  ?   Pain Loc --   ?   Pain Edu? --   ?   Excl. in [place]? --   ? ?Most recent vital signs: ?Vitals:  ? 09/17/21 0925  ?BP: 110/82  ?Pulse: (!) 102  ?Resp: 20  ?Temp: 97.8 ?F (36.6 ?C)  ?SpO2: 100%  ? ?General: Awake, oriented x4. ?CV:  Good peripheral perfusion.  ?Resp:  Normal effort.  ?Abd:  No  distention.  ?Other:  Middle-aged morbidly obese Caucasian male laying in bed in no distress.  Mild bilateral lower extremity pitting edema ?ED Results / Procedures / Treatments  ?Labs ?(all labs ordered are listed, but only abnormal results are displayed) ?Labs Reviewed  ?BASIC METABOLIC PANEL - Abnormal; Notable for the following components:  ?    Result Value  ? Glucose, Bld 197 (*)   ? BUN 27 (*)   ? Creatinine, Ser 1.44 (*)   ? GFR, Estimated 57 (*)   ? All other components within normal limits  ?CBC - Abnormal; Notable for the following components:  ? WBC 11.6 (*)   ? All other components within normal limits  ?BRAIN NATRIURETIC PEPTIDE  ?TROPONIN I (HIGH SENSITIVITY)  ?TROPONIN I (HIGH SENSITIVITY)  ? ?EKG ?ED ECG REPORT ?I, Naaman Plummer, the attending physician, personally viewed and interpreted this ECG. ?Date: 09/17/2021 ?EKG Time: 0926 ?Rate: 100 ?Rhythm: Tachycardic sinus rhythm ?QRS Axis: normal ?Intervals: normal ?ST/T Wave abnormalities: normal ?Narrative Interpretation: Tachycardic sinus rhythm.  No evidence of acute ischemia ?RADIOLOGY ?ED MD interpretation: One-view portable chest x-ray interpreted by me shows no evidence of acute abnormalities including no pneumonia, pneumothorax, or widened mediastinum ?-Agree with radiology assessment ?Official radiology report(s): ?CT Angio Chest PE W and/or Wo  Contrast ? ?Result Date: 09/16/2021 ?CLINICAL DATA:  CHF, abnormal laboratory evaluation, chest tightness, short of breath EXAM: CT ANGIOGRAPHY CHEST WITH CONTRAST TECHNIQUE: Multidetector CT imaging of the chest was performed using the standard protocol during bolus administration of intravenous contrast. Multiplanar CT image reconstructions and MIPs were obtained to evaluate the vascular anatomy. RADIATION DOSE REDUCTION: This exam was performed according to the departmental dose-optimization program which includes automated exposure control, adjustment of the mA and/or kV according to patient size  and/or use of iterative reconstruction technique. CONTRAST:  44m OMNIPAQUE IOHEXOL 350 MG/ML SOLN COMPARISON:  09/04/2021 FINDINGS: Cardiovascular: This is a technically adequate evaluation of the pulmonary vasculature. No filling defects or pulmonary emboli. The heart is unremarkable without pericardial effusion. Mild coronary artery atherosclerosis, with stent in the LAD distribution. No evidence of thoracic aortic aneurysm or dissection. Mild calcification of the aortic valve. Mediastinum/Nodes: No enlarged mediastinal, hilar, or axillary lymph nodes. Thyroid gland, trachea, and esophagus demonstrate no significant findings. Lungs/Pleura: No acute airspace disease, effusion, or pneumothorax. Central airways are patent. Upper Abdomen: No acute abnormality. Musculoskeletal: No acute or destructive bony lesions. Reconstructed images demonstrate no additional findings. Review of the MIP images confirms the above findings. IMPRESSION: 1. No evidence of pulmonary embolus. 2. No acute intrathoracic process. 3. Coronary artery atherosclerosis. Electronically Signed   By: MRanda NgoM.D.   On: 09/16/2021 15:07  ? ?DG Chest Port 1 View ? ?Result Date: 09/17/2021 ?CLINICAL DATA:  Chest pain EXAM: PORTABLE CHEST 1 VIEW COMPARISON:  Chest x-ray dated September 04, 2021 FINDINGS: The heart size and mediastinal contours are within normal limits. Both lungs are clear. The visualized skeletal structures are unremarkable. IMPRESSION: No active disease. Electronically Signed   By: LYetta GlassmanM.D.   On: 09/17/2021 09:41   ?PROCEDURES: ?Critical Care performed: Yes, see critical care procedure note(s) ?.1-3 Lead EKG Interpretation ?Performed by: BNaaman Plummer MD ?Authorized by: BNaaman Plummer MD  ? ?  Interpretation: normal   ?  ECG rate:  104 ?  ECG rate assessment: tachycardic   ?  Rhythm: sinus tachycardia   ?  Ectopy: none   ?  Conduction: normal   ?CRITICAL CARE ?Performed by: ENaaman Plummer? ?Total critical care  time: 37 minutes ? ?Critical care time was exclusive of separately billable procedures and treating other patients. ? ?Critical care was necessary to treat or prevent imminent or life-threatening deterioration. ? ?Critical care was time spent personally by me on the following activities: development of treatment plan with patient and/or surrogate as well as nursing, discussions with consultants, evaluation of patient's response to treatment, examination of patient, obtaining history from patient or surrogate, ordering and performing treatments and interventions, ordering and review of laboratory studies, ordering and review of radiographic studies, pulse oximetry and re-evaluation of patient's condition. ? ?MEDICATIONS ORDERED IN ED: ?Medications - No data to display ?IMPRESSION / MDM / ASSESSMENT AND PLAN / ED COURSE  ?I reviewed the triage vital signs and the nursing notes. ?             ?               ?Patient a 56year old male with recent catheterization on 09/08/2021 who presents for continued exertional chest pain ?Workup: ECG, CXR, CBC, BMP, Troponin ?Findings: ?ECG: No overt evidence of STEMI. No evidence of Brugada?s sign, delta wave, epsilon wave, significantly prolonged QTc, or malignant arrhythmia ?HS Troponin: Negative x1 ?Other Labs unremarkable for emergent problems. ?CXR: Without  PTX, PNA, or widened mediastinum ?Last Stress Test:  09/06/2021 ?Last Heart Catheterization:  09/08/2021 ?HEART Score: 6 ? ?Given History, Exam, and Workup I have low suspicion for ACS, Pneumothorax, Pneumonia, Pulmonary Embolus, Tamponade, Aortic Dissection or other emergent problem as a cause for this presentation.  ?Consult: I spoke to Dr. Garen Lah in cardiology who recommends admission for likely catheterization given persistent anginal chest pain ?High Risk Chest Pain ?Patient at increased risk for Major Adverse Cardiac Event (AMI, PCI, CABG, death) ?Interventions: ?ASA 383m given prior to arrival ?Defer Heparin  drip as patient pain free at this time,  ? ?Disposition: Admit for continued cardiac monitoring and trending of troponins as well as further evaluation for potential inpatient stress testing vs cardiac catheterizat

## 2021-09-17 NOTE — H&P (Signed)
?History and Physical  ? ? ?Patient: Brett Marshall WUJ:[id number] DOB: 03/02/1966 ?DOA: 09/17/2021 ?DOS: the patient was seen and examined on 09/17/2021 ?PCP: [name], MD  ?Patient coming from: Home ? ?Chief Complaint:  ?Chief Complaint  ?Patient presents with  ? Chest Pain  ? ?HPI: Brett Marshall is a 56 y.o. male with medical history significant of coronary artery disease, type 2 diabetes, essential hypertension, chronic diastolic congestive heart failure, obstructive sleep apnea not compliant with CPAP, pulmonary hypertension, who present to the hospital with chest pain. ?Patient had unstable angina and had coronary angiogram on 09/08/2021, at that time, a drug-eluting stent was placed in LAD. ?Since the procedure, patient has been complaining of chest tightness on a daily basis, he also has some shortness of breath with exertion.  He woke up this morning started have chest pain, localized to mid chest, 5/10 in severity.  Chest pain seem to be worse with deep breath or cough.  He has a chronic cough which is not worsened.  Patient also had a headache this morning. ?Patient was brought to the Cath Lab today, coronary angiogram did not show any in-stent occlusion. ? ?Review of Systems: As mentioned in the history of present illness. All other systems reviewed and are negative. ?Past Medical History:  ?Diagnosis Date  ? Asthma   ? CAD (coronary artery disease)   ? a. 08/2017 Cath: LM nl, LAD 30ost, 16m 30d, RI min irregs, LCX min irregs, RCA small, 40p, EF 45-50%, diff HK; b. 08/2021 MV: Ant/apical ishemia; c. 08/2021 PCI: LM nl, LAD 95ost (4.0x15 Onyx Frontier DES), 260m30d, D2 min irregs, RI/LCX min irregs, RCA 50p, EF 55-65%.  ? DDD (degenerative disc disease), lumbar   ? Depression   ? Diabetes mellitus without complication ([place]  ? Essential hypertension, benign   ? HFimpEF (heart failure with improved ejection fraction) ([place]  ? a. 08/2017 LV gram: EF 45-50%, glob HK; b. 08/2021 Echo: EF 60-65%, no rwma,  GrI DD, nl RV fxn.  ? Leg swelling   ? Left  ? Low back pain   ? Mixed ICM & NICM   ? a. 08/2017 LV gram: EF 45-50%, glob HK; b. 08/2021 Echo: EF 60-65%.  ? Morbid obesity (HC[place]  ? OSA on CPAP   ? non compliant  ? Pulmonary hypertension ([facility]Nav[facility] [place]  ? ?Past Surgical History:  ?Procedure Laterality Date  ? CARDIAC CATHETERIZATION    ? [place] no stents.  ? COLONOSCOPY WITH PROPOFOL N/A 04/11/2019  ? Procedure: COLONOSCOPY WITH PROPOFOL;  Surgeon: WoLucilla LameMD;  [facility];  Service: Endoscopy;  Laterality: N/A;  ? CORONARY STENT INTERVENTION N/A 09/08/2021  ? Procedure: CORONARY STENT INTERVENTION;  Surgeon: ArWellington HampshireMD;  [facility];  Service: Cardiovascular;  Laterality: N/A;  ? INTRAVASCULAR ULTRASOUND/IVUS N/A 09/08/2021  ? Procedure: Intravascular Ultrasound/IVUS;  Surgeon: ArWellington HampshireMD;  [facility];  Service: Cardiovascular;  Laterality: N/A;  ? LEFT HEART CATH AND CORONARY ANGIOGRAPHY N/A 09/08/2021  ? Procedure: LEFT HEART CATH AND CORONARY ANGIOGRAPHY;  Surgeon: ArWellington HampshireMD;  [facility];  Service: Cardiovascular;  Laterality: N/A;  ? RIGHT/LEFT HEART CATH AND CORONARY ANGIOGRAPHY N/A 09/02/2017  ? Procedure: RIGHT/LEFT HEART CATH AND CORONARY ANGIOGRAPHY;  Surgeon: ArWellington HampshireMD;  [facility];  Service: Cardiovascular;  Laterality: N/A;  ? ?Social History:  reports that he has never smoked.  He has never used smokeless tobacco. He reports current alcohol use. He reports that he does not use drugs. ? ?No Known Allergies ? ?Family History  ?Problem Relation Age of Onset  ? Diabetes Mother   ? Asthma Mother   ? Heart disease Father   ? Heart attack Father   ? Sjogren's syndrome Sister   ? Heart disease Brother 41  ?     CAD/CABG  ? ? ?Prior to Admission medications   ?Medication Sig Start Date End Date Taking? Authorizing Provider  ?albuterol (VENTOLIN HFA) 108 (90 Base) MCG/ACT inhaler  Inhale 1-2 puffs into the lungs every 6 (six) hours as needed for wheezing or shortness of breath. 09/11/20   Martyn Ehrich, NP  ?aspirin EC 81 MG EC tablet Take 1 tablet (81 mg total) by mouth daily. Swallow whole. 09/10/21   Amin, Jeanella Flattery, MD  ?atorvastatin (LIPITOR) 40 MG tablet Take 1 tablet (40 mg total) by mouth daily at 6 PM. 09/09/21   Amin, Jeanella Flattery, MD  ?carvedilol (COREG) 12.5 MG tablet Take 1.5 tablets (18.75 mg total) by mouth 2 (two) times daily. 09/16/21 12/15/21  Theora Gianotti, NP  ?cyclobenzaprine (FLEXERIL) 10 MG tablet Take 1 tablet (10 mg total) by mouth 3 (three) times daily as needed.For spasms 11/20/17   [name], MD  ?DULoxetine (CYMBALTA) 30 MG capsule Take 2 capsules (60 mg total) by mouth daily. 09/09/21   Amin, Jeanella Flattery, MD  ?famotidine (PEPCID) 40 MG tablet Take 40 mg by mouth daily.  08/14/18   [provider]  ?furosemide (LASIX) 20 MG tablet Take 1 tablet (20 mg total) by mouth daily as needed (for SOB or weight gain of 3 lbs overnight). 07/14/21   Wellington Hampshire, MD  ?glipiZIDE (GLUCOTROL) 5 MG tablet Take 1 tablet (5 mg total) by mouth 2 (two) times daily before a meal. 12/09/17   [name], MD  ?glucose blood (GE100 BLOOD GLUCOSE TEST) test strip Use as instructed 02/03/16   [name], MD  ?JARDIANCE 25 MG TABS tablet Take 25 mg by mouth daily. 06/06/20   [provider]  ?Lancets Glory Rosebush ULTRASOFT) lancets Check sugar twice daily 01/11/16   [name], MD  ?metFORMIN (GLUCOPHAGE) 1000 MG tablet Take 1 tablet (1,000 mg total) by mouth 2 (two) times daily with a meal. 12/09/17   [name], MD  ?ticagrelor (BRILINTA) 90 MG TABS tablet Take 1 tablet (90 mg total) by mouth 2 (two) times daily. 09/09/21 10/09/21  Damita Lack, MD  ? ? ?Physical Exam: ?Vitals:  ? 09/17/21 1515 09/17/21 1530 09/17/21 1545 09/17/21 1600  ?BP: 126/84 128/79 (!) 125/113 (!) 130/96  ?Pulse: 99 99 99 98  ?Resp: 17 (!) 30 17 19   ?Temp:       ?TempSrc:      ?SpO2: 98% 97% 98% 99%  ? ?Physical Exam ?Constitutional:   ?   General: He is not in acute distress. ?   Appearance: He is obese.  ?HENT:  ?   Head: Normocephalic and atraumatic.  ?Eyes:  ?   Pupils: Pupils are equal, round, and reactive to light.  ?Neck:  ?   Thyroid: No thyromegaly.  ?Cardiovascular:  ?   Rate and Rhythm: Normal rate and regular rhythm.  ?   Heart sounds: Normal heart sounds. No murmur heard. ?Pulmonary:  ?   Effort: Pulmonary effort is normal. No respiratory distress.  ?Abdominal:  ?   General: Bowel sounds  are normal.  ?   Palpations: Abdomen is soft. There is no hepatomegaly.  ?   Tenderness: There is no abdominal tenderness.  ?Musculoskeletal:     ?   General: Normal range of motion.  ?   Cervical back: Normal range of motion and neck supple.  ?   Right lower leg: No edema.  ?   Left lower leg: No edema.  ?Skin: ?   General: Skin is warm and dry.  ?   Coloration: Skin is not cyanotic.  ?Neurological:  ?   General: No focal deficit present.  ?   Mental Status: He is alert and oriented to person, place, and time.  ?Psychiatric:     ?   Mood and Affect: Mood normal.     ?   Behavior: Behavior normal.  ?  ?Data Reviewed: ? ?Creatinine level 1.44, BNP 26.4.  Troponin 5.0. ?CT chest angiogram performed on 09/16/2021 did not show any pulm emboli. ? ?Assessment and Plan: ?No notes have been filed under this hospital service. ?Service: Hospitalist ? ?Chest pain. ?Hypertrophic obstructive cardiomyopathy. ?Chronic diastolic congestive heart failure. ?Patient had a normal troponin, no coronary stenosis on angiogram today.  Chest pain could be due to hypertrophic cardiomyopathy. ?Additionally, will obtain a limited echocardiogram to rule out pericarditis. ?Most recent echocardiogram showed ejection fraction 60 to 65%, currently patient does not have any evidence of volume overload. ? ?Morbid obesity. ?Obstructive sleep apnea. ?Pulmonary hypertension. ?Placed on CPAP tonight, patient was not  compliant at home. ? ?Acute kidney injury. ?Patient had multiple doses of IV contrast, will start gentle rehydration, recheck renal function tomorrow. ? ?Type 2 diabetes. ?Follow-up with PCP as outpatien

## 2021-09-17 NOTE — Consult Note (Signed)
? ?Cardiology Consult  ?  ?Patient ID: Brett Marshall ?MRN: [id number], DOB/AGE: 56/28/1967  ? ?Admit date: 09/17/2021 ?Date of Consult: 09/17/2021 ? ?Primary Physician: [name], MD ?Primary Cardiologist: [name], MD ?Requesting Provider: Elenora Gamma, MD ? ?Patient Profile  ?  ?Brett Marshall is a 56 y.o. 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 Roosevelt Locks. ? ?Past Medical History  ? ?Past Medical History:  ?Diagnosis Date  ? Asthma   ? CAD (coronary artery disease)   ? a. 08/2017 Cath: LM nl, LAD 30ost, 62m 30d, RI min irregs, LCX min irregs, RCA small, 40p, EF 45-50%, diff HK; b. 08/2021 MV: Ant/apical ishemia; c. 08/2021 PCI: LM nl, LAD 95ost (4.0x15 Onyx Frontier DES), 229m30d, D2 min irregs, RI/LCX min irregs, RCA 50p, EF 55-65%.  ? DDD (degenerative disc disease), lumbar   ? Depression   ? Diabetes mellitus without complication ([facility]  ? Essential hypertension, benign   ? HFimpEF (heart failure with improved ejection fraction) ([facility]  ? a. 08/2017 LV gram: EF 45-50%, glob HK; b. 08/2021 Echo: EF 60-65%, no rwma, GrI DD, nl RV fxn.  ? Leg swelling   ? Left  ? Low back pain   ? Mixed ICM & NICM   ? a. 08/2017 LV gram: EF 45-50%, glob HK; b. 08/2021 Echo: EF 60-65%.  ? Morbid obesity ([facility]  ? OSA on CPAP   ? non compliant  ? Pulmonary hypertension ([facility]  ?  ?Past Surgical History:  ?Procedure Laterality Date  ? CARDIAC CATHETERIZATION    ? [place] no stents.  ? COLONOSCOPY WITH PROPOFOL N/A 04/11/2019  ? Procedure: COLONOSCOPY WITH PROPOFOL;  Surgeon: WoLucilla LameMD;  [facility];  Service: Endoscopy;  Laterality: N/A;  ? CORONARY STENT INTERVENTION N/A 09/08/2021  ? Procedure:  CORONARY STENT INTERVENTION;  Surgeon: ArWellington HampshireMD;  [facility];  Service: Cardiovascular;  Laterality: N/A;  ? INTRAVASCULAR ULTRASOUND/IVUS N/A 09/08/2021  ? Procedure: Intravascular Ultrasound/IVUS;  Surgeon: ArWellington HampshireMD;  [facility]: [facility];  Service: Cardiovascular;  Laterality: N/A;  ? LEFT HEART CATH AND CORONARY ANGIOGRAPHY N/A 09/08/2021  ? Procedure: LEFT HEART CATH AND CORONARY ANGIOGRAPHY;  Surgeon: ArWellington HampshireMD;  [facility];  Service: Cardiovascular;  Laterality: N/A;  ? RIGHT/LEFT HEART CATH AND CORONARY ANGIOGRAPHY N/A 09/02/2017  ? Procedure: RIGHT/LEFT HEART CATH AND CORONARY ANGIOGRAPHY;  Surgeon: ArWellington HampshireMD;  [facility];  Service: Cardiovascular;  Laterality: N/A;  ?  ? ?Allergies ? ?No Known Allergies ? ?History of Present Illness  ?  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  He previously underwent diagnostic catheterization in April 2019, which showed mild, nonobstructive CAD with an EF of 45 to 50%.  He had mildly elevated filling pressures and was subsequently referred to pulmonology and has since been treated for sleep apnea.  [facility] constant chest pain that was somewhat reproducible with  palpation, but also worse with exertion.  Troponins were minimally elevated.  Stress testing was undertaken and showed anterior and apical ischemia.  Diagnostic catheterization revealed a 95% stenosis in the ostial LAD and otherwise nonobstructive disease.  He underwent successful PCI and drug-eluting stent placement to the ostial LAD via a right ulnar approach, with improvement in exertional symptoms.  Post PCI, he did have  swelling and pain of his right forearm, with paresthesias of the fingers of the right hand.  Ultrasound showed a hematoma within the superficial soft tissues of the right upper extremity however, there was no evidence of pseudoaneurysm or AV fistula.  He was discharged home April 25 but return to the ED April 27 due to ongoing right forearm swelling and ecchymosis.  Repeat ultrasound again showed soft tissue hematoma, measuring up to 5.1 cm, which was largely unchanged, and without evidence of pseudoaneurysm or AV fistula. ? ?Brett Marshall was seen in cardiology clinic on May 2 with a several day history of worsening exertional chest tightness, dyspnea, and sharp chest pain.  He was also mildly tachycardic (sinus) and reported orthostatic lightheadedness with documented orthostatic hypotension on examination.  Labs were notable for acute kidney injury with a BUN of 28/creatinine 1.41.  He was mildly hyperkalemic at 5.3.  White count was mildly elevated at 13.  D-dimer was elevated at 2.61.  Troponin was normal at 6.  Enalapril was discontinued and in the setting of elevated D-dimer, he was sent to the emergency department for hydration and CTA.  This was negative for pulmonary embolism or other acute intrathoracic process.  He continued to have constant chest tightness and was subsequently discharged home from the ED with plan for outpatient follow-up with Dr. Fletcher Anon next week.  Unfortunately, upon awakening this morning, he noted not only chest tightness and dyspnea but also sharp midsternal chest pain, similar to what he experienced prior to his last hospitalization.  As result, he presented back to the emergency department this morning.  Here ECG is without acute ST or T changes.  Troponin remains normal at 5.  Creatinine remains mildly elevated at 1.44.  He currently reports 5/10 chest pain.  He is hemodynamically stable. ? ?Home Medications  ?  ?Prior to Admission medications   ?Medication Sig Start Date End Date  Taking? Authorizing Provider  ?albuterol (VENTOLIN HFA) 108 (90 Base) MCG/ACT inhaler Inhale 1-2 puffs into the lungs every 6 (six) hours as needed for wheezing or shortness of breath. 09/11/20   Martyn Ehrich, NP  ?aspirin EC 81 MG EC tablet Take 1 tablet (81 mg total) by mouth daily. Swallow whole. 09/10/21   Amin, Jeanella Flattery, MD  ?atorvastatin (LIPITOR) 40 MG tablet Take 1 tablet (40 mg total) by mouth daily at 6 PM. 09/09/21   Amin, Jeanella Flattery, MD  ?carvedilol (COREG) 12.5 MG tablet Take 1.5 tablets (18.75 mg total) by mouth 2 (two) times daily. 09/16/21 12/15/21  Theora Gianotti, NP  ?cyclobenzaprine (FLEXERIL) 10 MG tablet Take 1 tablet (10 mg total) by mouth 3 (three) times daily as needed.For spasms 11/20/17   [name], MD  ?DULoxetine (CYMBALTA) 30 MG capsule Take 2 capsules (60 mg total) by mouth daily. 09/09/21   Amin, Jeanella Flattery, MD  ?famotidine (PEPCID) 40 MG tablet Take 40 mg by mouth daily.  08/14/18   [provider]  ?furosemide (LASIX) 20 MG tablet Take 1 tablet (20 mg total) by mouth daily as needed (for SOB or weight gain of 3  lbs overnight). 07/14/21   Wellington Hampshire, MD  ?glipiZIDE (GLUCOTROL) 5 MG tablet Take 1 tablet (5 mg total) by mouth 2 (two) times daily before a meal. 12/09/17   [name], MD  ?glucose blood (GE100 BLOOD GLUCOSE TEST) test strip Use as instructed 02/03/16   [name], MD  ?JARDIANCE 25 MG TABS tablet Take 25 mg by mouth daily. 06/06/20   [provider]  ?Lancets Glory Rosebush ULTRASOFT) lancets Check sugar twice daily 01/11/16   [name], MD  ?metFORMIN (GLUCOPHAGE) 1000 MG tablet Take 1 tablet (1,000 mg total) by mouth 2 (two) times daily with a meal. 12/09/17   [name], MD  ?ticagrelor (BRILINTA) 90 MG TABS tablet Take 1 tablet (90 mg total) by mouth 2 (two) times daily. 09/09/21 10/09/21  Damita Lack, MD  ?  ? ?Family History  ?  ?Family History  ?Problem Relation Age of Onset  ? Diabetes Mother   ? Asthma  Mother   ? Heart disease Father   ? Heart attack Father   ? Sjogren's syndrome Sister   ? Heart disease Brother 69  ?     CAD/CABG  ? ?He indicated that his mother is alive. He indicated that his father is

## 2021-09-17 NOTE — ED Triage Notes (Signed)
Pt comes pov with sharp chest pain. Was seen here yesterday and recently for same. Had full cardiac work up yesterday. States pain started around 745 this morning.  ?

## 2021-09-17 NOTE — Addendum Note (Signed)
Addended by: Britt Bottom on: 09/17/2021 11:32 AM ? ? Modules accepted: Orders ? ?

## 2021-09-17 NOTE — Telephone Encounter (Signed)
Call transferred directly to this RN from scheduling.  ?The patient reports he was seen in the office yesterday. ?His appointment was to follow up on his recent hospitalization / discharge. ? ?09/08/21- Left heart cath with PCI & DES to ostial LAD. ?The patient did have post cath swelling/ pain to the right arm.  ?Ultrasound was performed showing no evidence of pseudoaneurysm or AV fistula.  ? ?09/11/21- Returned to the ER for ongoing right arm swelling- another ultrasound performed without evidence of a clot. ?Discharged home.  ? ? ?At his office visit yesterday, he did report low-level chest tightness that occurred about 2 days post discharge that worsens with exertion.  ?Symptoms were reported as similar to prior to recent stenting. ?He also was experiencing intermittent lightheadedness and was mildly tachycardic in the office yesterday.  ?Troponin & D-dimer were obtained yesterday>> D-dimer was slightly elevated, so the patient was referred back to the ED for CTA to rule out a PE.  ?CTA chest was negative for PE. ? ?However, the patient called the office this morning stating he woke up with intermittent chest tightness in the center of his chest and is SOB. ?He wanted to let us know that his wife is currently driving him back to the [facility] ER for further evaluation and possible treatment. ? ?I advised the patient I will notify Dr. Eilleen Kempf, NP that he is currently en route to the ER.  ? ?The patient voices understanding and is agreeable. ? ? ? ?

## 2021-09-17 NOTE — ED Notes (Signed)
Pt back to room. Pt states CP went from 2/10 to 4/10 with walking back to room.  ?

## 2021-09-17 NOTE — ED Notes (Signed)
Pt brought to room 34. Cardiology NP at bedside. Per NP, pt does not need NTG at this time for 2/10 chest tightness as labs were normal. Pt will have cardiac cath today around 1400 and is NPO. Pt is to be given NS infusion at 442 mL/hr from pre cath orders. Pt is ambulatory to bathroom at this time for BM and urinal was provided to measure UO. Pt denies dizziness and has steady gait.  ?

## 2021-09-17 NOTE — Telephone Encounter (Signed)
New message: ? ? ? ?Patient called , on his way to the ER. He wanted to talk to  a nurse or Ignacia Bayley. ?

## 2021-09-18 ENCOUNTER — Encounter: Payer: Self-pay | Admitting: Internal Medicine

## 2021-09-18 DIAGNOSIS — N179 Acute kidney failure, unspecified: Secondary | ICD-10-CM | POA: Diagnosis not present

## 2021-09-18 DIAGNOSIS — Z9989 Dependence on other enabling machines and devices: Secondary | ICD-10-CM

## 2021-09-18 DIAGNOSIS — I272 Pulmonary hypertension, unspecified: Secondary | ICD-10-CM

## 2021-09-18 DIAGNOSIS — K7581 Nonalcoholic steatohepatitis (NASH): Secondary | ICD-10-CM

## 2021-09-18 DIAGNOSIS — I25110 Atherosclerotic heart disease of native coronary artery with unstable angina pectoris: Secondary | ICD-10-CM

## 2021-09-18 DIAGNOSIS — I10 Essential (primary) hypertension: Secondary | ICD-10-CM | POA: Diagnosis not present

## 2021-09-18 DIAGNOSIS — I202 Refractory angina pectoris: Secondary | ICD-10-CM

## 2021-09-18 DIAGNOSIS — R0609 Other forms of dyspnea: Secondary | ICD-10-CM

## 2021-09-18 DIAGNOSIS — E119 Type 2 diabetes mellitus without complications: Secondary | ICD-10-CM

## 2021-09-18 DIAGNOSIS — G4733 Obstructive sleep apnea (adult) (pediatric): Secondary | ICD-10-CM

## 2021-09-18 DIAGNOSIS — I259 Chronic ischemic heart disease, unspecified: Secondary | ICD-10-CM | POA: Diagnosis not present

## 2021-09-18 LAB — BASIC METABOLIC PANEL
Anion gap: 9 (ref 5–15)
BUN: 22 mg/dL — ABNORMAL HIGH (ref 6–20)
CO2: 22 mmol/L (ref 22–32)
Calcium: 8.8 mg/dL — ABNORMAL LOW (ref 8.9–10.3)
Chloride: 105 mmol/L (ref 98–111)
Creatinine, Ser: 1.21 mg/dL (ref 0.61–1.24)
GFR, Estimated: >60 mL/min (ref >60)
Glucose, Bld: 158 mg/dL — ABNORMAL HIGH (ref 70–99)
Potassium: 4.4 mmol/L (ref 3.5–5.1)
Sodium: 136 mmol/L (ref 135–145)

## 2021-09-18 LAB — GLUCOSE, CAPILLARY
Glucose-Capillary: 156 mg/dL — ABNORMAL HIGH (ref 70–99)
Glucose-Capillary: 228 mg/dL — ABNORMAL HIGH (ref 70–99)

## 2021-09-18 LAB — CBC
HCT: 41 % (ref 39.0–52.0)
Hemoglobin: 13.5 g/dL (ref 13.0–17.0)
MCH: 27.7 pg (ref 26.0–34.0)
MCHC: 32.9 g/dL (ref 30.0–36.0)
MCV: 84 fL (ref 80.0–100.0)
Platelets: 252 10*3/uL (ref 150–400)
RBC: 4.88 MIL/uL (ref 4.22–5.81)
RDW: 14.6 % (ref 11.5–15.5)
WBC: 9.1 10*3/uL (ref 4.0–10.5)
nRBC: 0 % (ref 0.0–0.2)

## 2021-09-18 LAB — MAGNESIUM: Magnesium: 1.9 mg/dL (ref 1.7–2.4)

## 2021-09-18 MED ORDER — METOPROLOL TARTRATE 50 MG PO TABS
100.0000 mg | ORAL_TABLET | Freq: Two times a day (BID) | ORAL | Status: DC
  Administered 2021-09-18: 100 mg via ORAL
  Filled 2021-09-18: qty 2

## 2021-09-18 MED ORDER — CYCLOBENZAPRINE HCL 10 MG PO TABS: 10.0000 mg | ORAL_TABLET | Freq: Three times a day (TID) | ORAL | Status: DC | PRN

## 2021-09-18 MED ORDER — GLIPIZIDE 5 MG PO TABS
5.0000 mg | ORAL_TABLET | Freq: Two times a day (BID) | ORAL | Status: DC
Start: 2021-09-18 — End: 2021-09-18
  Administered 2021-09-18: 5 mg via ORAL
  Filled 2021-09-18 (×2): qty 1

## 2021-09-18 MED ORDER — ASPIRIN EC 81 MG PO TBEC
81.0000 mg | DELAYED_RELEASE_TABLET | Freq: Every day | ORAL | Status: DC
  Administered 2021-09-18: 81 mg via ORAL
  Filled 2021-09-18: qty 1

## 2021-09-18 MED ORDER — FAMOTIDINE 20 MG PO TABS
40.0000 mg | ORAL_TABLET | Freq: Every day | ORAL | Status: DC
Start: 2021-09-18 — End: 2021-09-18
  Administered 2021-09-18: 40 mg via ORAL
  Filled 2021-09-18: qty 2

## 2021-09-18 MED ORDER — METOPROLOL TARTRATE 100 MG PO TABS: 100.0000 mg | ORAL_TABLET | Freq: Two times a day (BID) | ORAL | 0 refills | Status: DC

## 2021-09-18 MED ORDER — ATORVASTATIN CALCIUM 20 MG PO TABS: 40.0000 mg | ORAL_TABLET | Freq: Every day | ORAL | Status: DC

## 2021-09-18 MED ORDER — TICAGRELOR 90 MG PO TABS
90.0000 mg | ORAL_TABLET | Freq: Two times a day (BID) | ORAL | Status: DC
  Administered 2021-09-18: 90 mg via ORAL
  Filled 2021-09-18: qty 1

## 2021-09-18 MED ORDER — ALBUTEROL SULFATE (2.5 MG/3ML) 0.083% IN NEBU: 3.0000 mL | INHALATION_SOLUTION | Freq: Four times a day (QID) | RESPIRATORY_TRACT | Status: DC | PRN

## 2021-09-18 MED ORDER — DULOXETINE HCL 30 MG PO CPEP
60.0000 mg | ORAL_CAPSULE | Freq: Every day | ORAL | Status: DC
  Administered 2021-09-18: 60 mg via ORAL
  Filled 2021-09-18: qty 2

## 2021-09-18 NOTE — Progress Notes (Addendum)
? ?Cardiology Progress Note  ? ?Patient Name: Brett Marshall ?Date of Encounter: 09/18/2021 ? ?Primary Cardiologist: [name], MD ? ?Subjective  ? ?Reports feeling well this morning, no significant chest pain on exertion ?Went for cardiac catheterization yesterday, stable disease, concern for outflow tract obstruction noted ? ?Inpatient Medications  ?  ?Scheduled Meds: ? enoxaparin (LOVENOX) injection  40 mg Subcutaneous Q24H  ? sodium chloride flush  3 mL Intravenous Q12H  ? sodium chloride flush  3 mL Intravenous Q12H  ? ?Continuous Infusions: ? sodium chloride    ? ?PRN Meds: ?sodium chloride, acetaminophen, ondansetron (ZOFRAN) IV, sodium chloride flush  ? ?Vital Signs  ?  ?Vitals:  ? 09/17/21 1735 09/17/21 2101 09/17/21 2342 09/18/21 0610  ?BP: 126/70 (!) 148/89 137/78 129/78  ?Pulse: (!) 105 100 92 95  ?Resp: 20 18 18 18   ?Temp: 97.9 ?F (36.6 ?C) 98 ?F (36.7 ?C) 98.2 ?F (36.8 ?C) 98 ?F (36.7 ?C)  ?TempSrc:  Oral Oral   ?SpO2: 99% 98% 98% 99%  ? ? ?Intake/Output Summary (Last 24 hours) at 09/18/2021 0732 ?Last data filed at 09/17/2021 2243 ?Gross per 24 hour  ?Intake 618 ml  ?Output 25 ml  ?Net 593 ml  ? ?There were no vitals filed for this visit. ? ?Physical Exam  ? ?Constitutional:  oriented to person, place, and time. No distress.  Obese ?HENT:  ?Head: Grossly normal ?Eyes:  no discharge. No scleral icterus.  ?Neck: No JVD, no carotid bruits  ?Cardiovascular: Regular rate and rhythm, no murmurs appreciated ?Pulmonary/Chest: Clear to auscultation bilaterally, no wheezes or rails ?Abdominal: Soft.  no distension.  no tenderness.  ?Musculoskeletal: Normal range of motion ?Neurological:  normal muscle tone. Coordination normal. No atrophy ?Skin: Skin warm and dry ?Psychiatric: normal affect, pleasant ? ?Labs  ?  ?Chemistry ?Recent Labs  ?Lab 09/16/21 ?6440 09/17/21 ?3474 09/17/21 ?1739  ?NA 135 136  --   ?K 5.3* 5.0  --   ?CL 100 102  --   ?CO2 25 23  --   ?GLUCOSE 211* 197*  --   ?BUN 28* 27*  --    ?CREATININE 1.41* 1.44* 1.17  ?CALCIUM 9.4 9.3  --   ?GFRNONAA 58* 57* >60  ?ANIONGAP 10 11  --   ?  ? ?Hematology ?Recent Labs  ?Lab 09/16/21 ?2595 09/17/21 ?6387 09/17/21 ?1739  ?WBC 13.0* 11.6* 10.3  ?RBC 5.40 5.19 4.88  ?HGB 14.8 14.2 13.2  ?HCT 46.0 44.2 41.0  ?MCV 85.2 85.2 84.0  ?MCH 27.4 27.4 27.0  ?MCHC 32.2 32.1 32.2  ?RDW 14.7 14.7 14.7  ?PLT 274 271 222  ? ? ?Cardiac Enzymes  ?Recent Labs  ?Lab 09/04/21 ?2355 09/05/21 ?0116 09/16/21 ?5643 09/17/21 ?3295 09/17/21 ?1128  ?TROPONINIHS 25* 28* 6 5 4   ?   ? ?BNP ?   ?Component Value Date/Time  ? BNP 26.4 09/17/2021 0928  ? ? ?ProBNP ?No results found for: PROBNP ? ? ?DDimer  ?Recent Labs  ?Lab 09/16/21 ?0947  ?DDIMER 2.61*  ?  ? ?Lipids  ?Lab Results  ?Component Value Date  ? CHOL 145 09/07/2021  ? HDL 43 09/07/2021  ? [place] 79 09/07/2021  ? TRIG 113 09/07/2021  ? CHOLHDL 3.4 09/07/2021  ? ? ?HbA1c  ?Lab Results  ?Component Value Date  ? HGBA1C 7.5 (H) 09/07/2021  ? ? ?Radiology  ?  ?DG Chest Port 1 View ? ?Result Date: 09/17/2021 ?CLINICAL DATA:  Chest pain EXAM: PORTABLE CHEST 1 VIEW COMPARISON:  Chest x-ray dated  September 04, 2021 FINDINGS: The heart size and mediastinal contours are within normal limits. Both lungs are clear. The visualized skeletal structures are unremarkable. IMPRESSION: No active disease. Electronically Signed   By: Yetta Glassman M.D.   On: 09/17/2021 09:41   ? ?Telemetry  ?  ?Normal sinus rhythm- Personally Reviewed ? ?Cardiac Studies  ? ? 08/2017 Cath: LM nl, LAD 30ost, 42m 30d, RI min irregs, LCX min irregs, RCA small, 40p, EF 45-50%, diff HK. ? ?08/2021 MV: Ant/apical ishemia. ? ?08/2021 PCI: LM nl, LAD 95ost (4.0x15 Onyx Frontier DES), 270m30d, D2 min irregs, RI/LCX min irregs, RCA 50p, EF 55-65%.  ? ?Cardiac Catheterization  5.3.2023 ? ?Dominance: Co-dominant ? ?Left Ventricle LV end diastolic pressure is normal. LVEDP 10-15 mmHg. Dynamic LVOT gradient was observed: ?Resting gradient: 50-80 mmHg. ?Provoked gradient: 70-110 mmHg  (positive Brockenbrough-Braunwald-Morrow sign).  ? ?Patient Profile  ?   ?[age].o. 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  Relook cath w/ patent LAD stent.  LV pressures suggestive of HCM. ? ?Assessment & Plan  ?  ?1.  CAD/Chest tightness: ?Stent placed to ostial LAD September 08, 2021 ?Has had stuttering chest pain of various types since that time, presenting with symptoms concerning for angina ?-Cardiac catheterization yesterday with patent stent ?Additional measurements in the catheterization lab concerning for outflow tract gradient ?-Echocardiogram with normal LV function ?-Medication changes made this admission, started on metoprolol tartrate 100 twice daily ?Has had a dose this morning, denies symptoms.  Will monitor vitals over the next hour or so. ?He is agreeable to go home with close follow-up in clinic ?-Plan is for outpatient cardiac MRI to rule out hypertrophic cardiomyopathy ?-On aspirin Brilinta.  Ecchymotic bruising noted right forearm ? ?2.  Hypertrophic cardiomyopathy: ?Echocardiogram images reviewed, LV wall thickness is underwhelming ?-Elevated left ventricular outflow tract gradient in catheterization lab and with provocation, post PVC ?-Recommendation was made for cardiac MRI as outpatient ? ?3.  Essential HTN/Recent orthostatic hypotension: ?Started on metoprolol tartrate 100 twice daily ?ACE inhibitor held for now ? ?4.  HL: On Lipitor 40, goal LDL less than 70 preferably less than 60 ? ?5.  DMII: ?Lifestyle modification recommended ?Last A1c 7.52 weeks ago ?Needs to work closely with primary care ? ?6.  AKI: ?Stable renal function post catheterization ? ?7.  Sinus tachycardia ?On high-dose metoprolol tartrate  100 twice daily starting this morning. ?If rate continues to run high, potentially could add Cardizem ER 120 daily.  This could be done as outpatient if needed ? ? Total encounter time more than 50 minutes ? Greater than 50% was spent in counseling and coordination of care with the patient ? ? ?Signed, ?TiEsmond PlantsMD, Ph.D ?[facility] ? ?For questions or updates, please contact   ?Please consult [email or web address] for contact info under Cardiology/STEMI.  ?

## 2021-09-18 NOTE — Discharge Summary (Signed)
?Physician Discharge Summary ?  ?Patient: Brett Marshall MRN: [id number] DOB: 31-Mar-1966  ?Admit date:     09/17/2021  ?Discharge date: 09/18/21  ?Discharge Physician: Sharen Hones  ? ?PCP: [name], MD  ? ?Recommendations at discharge:  ? ?Follow-up with PCP in 1 week. ?Follow-up with cardiology in 2 weeks ? ?Discharge Diagnoses: ?Principal Problem: ?  Chest pain ?Active Problems: ?  Essential hypertension ?  Type 2 diabetes mellitus without complication, without long-term current use of insulin ([place]) ?  Morbid obesity ([place]) ?  NASH (nonalcoholic steatohepatitis) ?  Pulmonary hypertension ([place]) ?  OSA on CPAP ?  Coronary artery disease involving native coronary artery of native heart with unstable angina pectoris ([place]) ?  AKI (acute kidney injury) ([place]) ?  Obstructive hypertrophic cardiomyopathy ([place]) ? ?Resolved Problems: ?  * No resolved hospital problems. * ? ?Hospital Course: ?Brett Marshall is a 56 y.o. male with medical history significant of coronary artery disease, type 2 diabetes, essential hypertension, chronic diastolic congestive heart failure, obstructive sleep apnea not compliant with CPAP, pulmonary hypertension, who present to the hospital with chest pain. ?Patient had unstable angina and had coronary angiogram on 09/08/2021, at that time, a drug-eluting stent was placed in LAD. ?Since the procedure, patient has been complaining of chest tightness on a daily basis, he also has some shortness of breath with exertion.  He woke up this morning started have chest pain, localized to mid chest, 5/10 in severity.  Chest pain seem to be worse with deep breath or cough.  He has a chronic cough which is not worsened.  Patient also had a headache this morning. ?Patient was brought to the Cath Lab 5/3, coronary angiogram did not show any in-stent occlusion. ? ?Assessment and Plan: ? ?Chest pain. ?Hypertrophic obstructive cardiomyopathy. ?Chronic diastolic congestive heart failure. ?Patient had a normal  troponin, no coronary stenosis on angiogram today.  Chest pain could be due to hypertrophic cardiomyopathy. ?Most recent echocardiogram showed ejection fraction 60 to 65%, currently patient does not have any evidence of volume overload. ?Patient chest pain has resolved since beta-blocker was switched to metoprolol 100 mg twice a day.  Currently, patient has no chest pain, making pericarditis less likely. ?Followed by cardiology in 2 weeks. ?  ?Morbid obesity. ?Obstructive sleep apnea. ?Pulmonary hypertension. ?Patient is advised to wear CPAP at nighttime.  Diet exercise also advised. ?  ?Acute kidney injury. ?Renal function has improved ? ?Type 2 diabetes. ?Resume all home medicines ?  ? ? ?  ? ? ?Consultants: cardiology ?Procedures performed: cardiac cath  ?Disposition: Home ?Diet recommendation:  ?Discharge Diet Orders (From admission, onward)  ? ?  Start     Ordered  ? 09/18/21 0000  Diet - low sodium heart healthy       ? 09/18/21 1204  ? ?  ?  ? ?  ? ?Cardiac diet ?DISCHARGE MEDICATION: ?Allergies as of 09/18/2021   ?No Known Allergies ?  ? ?  ?Medication List  ?  ? ?STOP taking these medications   ? ?carvedilol 12.5 MG tablet ?Commonly known as: COREG ?  ? ?  ? ?TAKE these medications   ? ?albuterol 108 (90 Base) MCG/ACT inhaler ?Commonly known as: VENTOLIN HFA ?Inhale 1-2 puffs into the lungs every 6 (six) hours as needed for wheezing or shortness of breath. ?  ?aspirin 81 MG EC tablet ?Take 1 tablet (81 mg total) by mouth daily. Swallow whole. ?  ?atorvastatin 40 MG tablet ?Commonly known as: LIPITOR ?  Take 1 tablet (40 mg total) by mouth daily at 6 PM. ?  ?cyclobenzaprine 10 MG tablet ?Commonly known as: FLEXERIL ?Take 1 tablet (10 mg total) by mouth 3 (three) times daily as needed.For spasms ?  ?DULoxetine 30 MG capsule ?Commonly known as: CYMBALTA ?Take 2 capsules (60 mg total) by mouth daily. ?  ?famotidine 40 MG tablet ?Commonly known as: PEPCID ?Take 40 mg by mouth daily. ?  ?furosemide 20 MG  tablet ?Commonly known as: LASIX ?Take 1 tablet (20 mg total) by mouth daily as needed (for SOB or weight gain of 3 lbs overnight). ?  ?glipiZIDE 5 MG tablet ?Commonly known as: GLUCOTROL ?Take 1 tablet (5 mg total) by mouth 2 (two) times daily before a meal. ?  ?glucose blood test strip ?Commonly known as: GE100 Blood Glucose Test ?Use as instructed ?  ?Jardiance 25 MG Tabs tablet ?Generic drug: empagliflozin ?Take 25 mg by mouth daily. ?  ?metFORMIN 1000 MG tablet ?Commonly known as: GLUCOPHAGE ?Take 1 tablet (1,000 mg total) by mouth 2 (two) times daily with a meal. ?  ?metoprolol tartrate 100 MG tablet ?Commonly known as: LOPRESSOR ?Take 1 tablet (100 mg total) by mouth 2 (two) times daily. ?  ?onetouch ultrasoft lancets ?Check sugar twice daily ?  ?ticagrelor 90 MG Tabs tablet ?Commonly known as: BRILINTA ?Take 1 tablet (90 mg total) by mouth 2 (two) times daily. ?  ?traZODone 50 MG tablet ?Commonly known as: DESYREL ?Take 50-100 mg by mouth at bedtime. ?  ? ?  ? ? Follow-up Information   ? ? [name], MD Follow up in 1 week(s).   ?Specialty: Family Medicine ?Contact information: ?[address] ?[place] [address] ?[phone] ? ? ?  ?  ? ? Wellington Hampshire, MD Follow up in 2 week(s).   ?Specialty: Cardiology ?Contact information: ?[address] ?[address] ?[place] [address] ?[phone] ? ? ?  ?  ? ?  ?  ? ?  ? ?Discharge Exam: ?There were no vitals filed for this visit. ?General exam: Appears calm and comfortable  ?Respiratory system: Clear to auscultation. Respiratory effort normal. ?Cardiovascular system: S1 & S2 heard, RRR. No JVD, murmurs, rubs, gallops or clicks. No pedal edema. ?Gastrointestinal system: Abdomen is nondistended, soft and nontender. No organomegaly or masses felt. Normal bowel sounds heard. ?Central nervous system: Alert and oriented. No focal neurological deficits. ?Extremities: Symmetric 5 x 5 power. ?Skin: No rashes, lesions or ulcers ?Psychiatry: Judgement  and insight appear normal. Mood & affect appropriate.  ? ? ?Condition at discharge: good ? ?The results of significant diagnostics from this hospitalization (including imaging, microbiology, ancillary and laboratory) are listed below for reference.  ? ?Imaging Studies: ?DG Chest 2 View ? ?Result Date: 09/04/2021 ?CLINICAL DATA:  Chest pain. EXAM: CHEST - 2 VIEW COMPARISON:  Chest radiograph dated 09/11/2020. FINDINGS: The heart size and mediastinal contours are within normal limits. Both lungs are clear. The visualized skeletal structures are unremarkable. IMPRESSION: No active cardiopulmonary disease. Electronically Signed   By: Anner Crete M.D.   On: 09/04/2021 23:49  ? ?CT Angio Chest PE W and/or Wo Contrast ? ?Result Date: 09/16/2021 ?CLINICAL DATA:  CHF, abnormal laboratory evaluation, chest tightness, short of breath EXAM: CT ANGIOGRAPHY CHEST WITH CONTRAST TECHNIQUE: Multidetector CT imaging of the chest was performed using the standard protocol during bolus administration of intravenous contrast. Multiplanar CT image reconstructions and MIPs were obtained to evaluate the vascular anatomy. RADIATION DOSE REDUCTION: This exam was performed  according to the departmental dose-optimization program which includes automated exposure control, adjustment of the mA and/or kV according to patient size and/or use of iterative reconstruction technique. CONTRAST:  38m OMNIPAQUE IOHEXOL 350 MG/ML SOLN COMPARISON:  09/04/2021 FINDINGS: Cardiovascular: This is a technically adequate evaluation of the pulmonary vasculature. No filling defects or pulmonary emboli. The heart is unremarkable without pericardial effusion. Mild coronary artery atherosclerosis, with stent in the LAD distribution. No evidence of thoracic aortic aneurysm or dissection. Mild calcification of the aortic valve. Mediastinum/Nodes: No enlarged mediastinal, hilar, or axillary lymph nodes. Thyroid gland, trachea, and esophagus demonstrate no significant  findings. Lungs/Pleura: No acute airspace disease, effusion, or pneumothorax. Central airways are patent. Upper Abdomen: No acute abnormality. Musculoskeletal: No acute or destructive bony lesions. Reconstructed ima

## 2021-09-18 NOTE — Progress Notes (Signed)
Nsg Discharge Note ? ?Admit Date:  09/17/2021 ?Discharge date: 09/18/2021 ?  ?Brett Marshall to be D/C'd Home per MD order.  AVS completed. Patient/caregiver able to verbalize understanding. ? ?Discharge Medication: ?Allergies as of 09/18/2021   ?No Known Allergies ?  ? ?  ?Medication List  ?  ? ?STOP taking these medications   ? ?carvedilol 12.5 MG tablet ?Commonly known as: COREG ?  ? ?  ? ?TAKE these medications   ? ?albuterol 108 (90 Base) MCG/ACT inhaler ?Commonly known as: VENTOLIN HFA ?Inhale 1-2 puffs into the lungs every 6 (six) hours as needed for wheezing or shortness of breath. ?  ?aspirin 81 MG EC tablet ?Take 1 tablet (81 mg total) by mouth daily. Swallow whole. ?  ?atorvastatin 40 MG tablet ?Commonly known as: LIPITOR ?Take 1 tablet (40 mg total) by mouth daily at 6 PM. ?  ?cyclobenzaprine 10 MG tablet ?Commonly known as: FLEXERIL ?Take 1 tablet (10 mg total) by mouth 3 (three) times daily as needed.For spasms ?  ?DULoxetine 30 MG capsule ?Commonly known as: CYMBALTA ?Take 2 capsules (60 mg total) by mouth daily. ?  ?famotidine 40 MG tablet ?Commonly known as: PEPCID ?Take 40 mg by mouth daily. ?  ?furosemide 20 MG tablet ?Commonly known as: LASIX ?Take 1 tablet (20 mg total) by mouth daily as needed (for SOB or weight gain of 3 lbs overnight). ?  ?glipiZIDE 5 MG tablet ?Commonly known as: GLUCOTROL ?Take 1 tablet (5 mg total) by mouth 2 (two) times daily before a meal. ?  ?glucose blood test strip ?Commonly known as: GE100 Blood Glucose Test ?Use as instructed ?  ?Jardiance 25 MG Tabs tablet ?Generic drug: empagliflozin ?Take 25 mg by mouth daily. ?  ?metFORMIN 1000 MG tablet ?Commonly known as: GLUCOPHAGE ?Take 1 tablet (1,000 mg total) by mouth 2 (two) times daily with a meal. ?  ?metoprolol tartrate 100 MG tablet ?Commonly known as: LOPRESSOR ?Take 1 tablet (100 mg total) by mouth 2 (two) times daily. ?  ?onetouch ultrasoft lancets ?Check sugar twice daily ?  ?ticagrelor 90 MG Tabs tablet ?Commonly  known as: BRILINTA ?Take 1 tablet (90 mg total) by mouth 2 (two) times daily. ?  ?traZODone 50 MG tablet ?Commonly known as: DESYREL ?Take 50-100 mg by mouth at bedtime. ?  ? ?  ? ? ?Discharge Assessment: ?Vitals:  ? 09/18/21 0750 09/18/21 1141  ?BP: 126/77 114/65  ?Pulse: 93 87  ?Resp: 18 18  ?Temp: 97.8 ?F (36.6 ?C) 98.3 ?F (36.8 ?C)  ?SpO2: 97% 98%  ? Skin clean, dry and intact without evidence of skin break down, no evidence of skin tears noted. ?IV catheter discontinued intact. Site without signs and symptoms of complications - no redness or edema noted at insertion site, patient denies c/o pain - only slight tenderness at site.  Dressing with slight pressure applied. ? ?D/c Instructions-Education: ?Discharge instructions given to patient/family with verbalized understanding. ?D/c education completed with patient/family including follow up instructions, medication list, d/c activities limitations if indicated, with other d/c instructions as indicated by MD - patient able to verbalize understanding, all questions fully answered. ?Patient instructed to return to ED, call 911, or call MD for any changes in condition.  ?Patient escorted via [place], and D/C home via private auto. ? ?Albertha Beattie, Jolene Schimke, RN ?09/18/2021 1:53 PM  ?

## 2021-09-18 NOTE — TOC Initial Note (Signed)
Transition of Care (TOC) - Initial/Assessment Note  ? ? ?Patient Details  ?Name: Brett Marshall ?MRN: [id number] ?Date of Birth: 1966-02-18 ? ?Transition of Care (TOC) CM/SW Contact:    ?Laurena Slimmer, RN ?Phone Number: ?09/18/2021, 12:09 PM ? ?Clinical Narrative:                 ? ?Transition of Care (TOC) Screening Note ? ? ?Patient Details  ?Name: Brett Marshall ?Date of Birth: Feb 03, 1966 ? ? ?Transition of Care (TOC) CM/SW Contact:    ?Laurena Slimmer, RN ?Phone Number: ?09/18/2021, 12:09 PM ? ? ? ?Transition of Care Department [facility]) has reviewed patient and no TOC needs have been identified at this time. We will continue to monitor patient advancement through interdisciplinary progression rounds. If new patient transition needs arise, please place a TOC consult. ? ? ? ?  ?  ? ? ?Patient Goals and CMS Choice ?  ?  ?  ? ?Expected Discharge Plan and Services ?  ?  ?  ?  ?  ?Expected Discharge Date: 09/18/21               ?  ?  ?  ?  ?  ?  ?  ?  ?  ?  ? ?Prior Living Arrangements/Services ?  ?  ?  ?       ?  ?  ?  ?  ? ?Activities of Daily Living ?Home Assistive Devices/Equipment: CPAP ?ADL Screening (condition at time of admission) ?Patient's cognitive ability adequate to safely complete daily activities?: Yes ?Is the patient deaf or have difficulty hearing?: No ?Does the patient have difficulty seeing, even when wearing glasses/contacts?: No ?Does the patient have difficulty concentrating, remembering, or making decisions?: No ?Patient able to express need for assistance with ADLs?: Yes ?Does the patient have difficulty dressing or bathing?: No ?Independently performs ADLs?: Yes (appropriate for developmental age) ?Does the patient have difficulty walking or climbing stairs?: No ?Weakness of Legs: None ?Weakness of Arms/Hands: None ? ?Permission Sought/Granted ?  ?  ?   ?   ?   ?   ? ?Emotional Assessment ?  ?  ?  ?  ?  ?  ? 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 Diagnosis Date Noted  ? AKI (acute kidney injury) ([place]) 09/17/2021  ? Obstructive hypertrophic cardiomyopathy ([place]) 09/17/2021  ? Coronary artery disease involving native coronary artery of native heart with unstable angina pectoris ([place])   ? Spontaneous hematoma of forearm   ? Unstable angina ([facility])   ? Chest pain 09/05/2021  ? Controlled type 2 diabetes mellitus without complication, without long-term current use of insulin ([place]) 09/05/2021  ? Dyspnea 10/21/2020  ? Hypercholesterolemia 06/05/2019  ? Encounter for screening colonoscopy   ? Polyp of sigmoid colon   ? Gastroesophageal reflux disease without esophagitis 04/10/2018  ? OSA on CPAP 04/10/2018  ? Chronic systolic congestive heart failure ([place]) 12/09/2017  ? Pulmonary hypertension ([place]) 12/09/2017  ? NASH (nonalcoholic steatohepatitis) 11/07/2017  ? Essential hypertension 09/13/2016  ? DISH (diffuse idiopathic skeletal hyperostosis) 05/17/2016  ? DDD (degenerative disc disease), lumbar 05/17/2016  ? Type 2 diabetes mellitus without complication, without long-term current use of insulin ([place]) 02/08/2016  ? Gynecomastia 02/08/2016  ? Morbid obesity ([place]) 02/08/2016  ? ?PCP:  [name], MD ?Pharmacy:   ?[facility]  [place] [place] - [address] ?[address] ?[place] [address] ?Phone: [phone] Fax: [phone] ? ? ? ? ?Social Determinants of Health (SDOH) Interventions ?  ? ?Readmission Risk Interventions ?   ? View : No data to display.  ?  ?  ?  ? ? ? ?

## 2021-09-19 LAB — LIPOPROTEIN A (LPA): Lipoprotein (a): 44.1 nmol/L — ABNORMAL HIGH (ref <75.0)

## 2021-09-23 ENCOUNTER — Ambulatory Visit: Payer: BLUE CROSS/BLUE SHIELD | Admitting: Cardiovascular Disease

## 2021-09-23 ENCOUNTER — Encounter: Payer: Self-pay | Admitting: Cardiovascular Disease

## 2021-09-23 VITALS — BP 104/64 | HR 70 | Ht 68.0 in | Wt 330.4 lb

## 2021-09-23 DIAGNOSIS — I25118 Atherosclerotic heart disease of native coronary artery with other forms of angina pectoris: Secondary | ICD-10-CM

## 2021-09-23 DIAGNOSIS — E785 Hyperlipidemia, unspecified: Secondary | ICD-10-CM

## 2021-09-23 DIAGNOSIS — I422 Other hypertrophic cardiomyopathy: Secondary | ICD-10-CM

## 2021-09-23 DIAGNOSIS — Z0279 Encounter for issue of other medical certificate: Secondary | ICD-10-CM

## 2021-09-23 DIAGNOSIS — I10 Essential (primary) hypertension: Secondary | ICD-10-CM

## 2021-09-23 DIAGNOSIS — G4730 Sleep apnea, unspecified: Secondary | ICD-10-CM

## 2021-09-23 NOTE — Patient Instructions (Addendum)
Medication Instructions:  ?Your physician recommends that you continue on your current medications as directed. Please refer to the Current Medication list given to you today. ? ?*If you need a refill on your cardiac medications before your next appointment, please call your pharmacy* ? ? ?Lab Work: ?None ordered ?If you have labs (blood work) drawn today and your tests are completely normal, you will receive your results only by: ?MyChart Message (if you have MyChart) OR ?A paper copy in the mail ?If you have any lab test that is abnormal or we need to change your treatment, we will call you to review the results. ? ? ?Testing/Procedures: ?Your physician has requested that you have a cardiac MRI. Cardiac MRI uses a computer to create images of your heart as its beating, producing both still and moving pictures of your heart and major blood vessels. For further information please visit [email or web address] Please follow the instruction sheet given to you today for more information.  ? ? ?Follow-Up: ?At [facility], you and your health needs are our priority.  As part of our continuing mission to provide you with exceptional heart care, we have created designated Provider Care Teams.  These Care Teams include your primary Cardiologist (physician) and Advanced Practice Providers (APPs -  Physician Assistants and Nurse Practitioners) who all work together to provide you with the care you need, when you need it. ? ?We recommend signing up for the patient portal called "MyChart".  Sign up information is provided on this After Visit Summary.  MyChart is used to connect with patients for Virtual Visits (Telemedicine).  Patients are able to view lab/test results, encounter notes, upcoming appointments, etc.  Non-urgent messages can be sent to your provider as well.   ?To learn more about what you can do with MyChart, go to [email or web address].   ? ?Your next appointment:   ?4 week(s) needs to be after the Cardiac  MRI ? ?The format for your next appointment:   ?In Person ? ?Provider:   ?You may see [name], MD or one of the following Advanced Practice Providers on your designated Care Team:   ?Murray Hodgkins, NP ?Christell Faith, PA-C ?Cadence Kathlen Mody, PA-C{ ? ?You have been referred to [facility]. They will call you to schedule.  ? ? ?Other Instructions ? ?They will call you to scheduled the Cardiac MRI ?You are scheduled for Cardiac MRI on TBD. Please arrive at the [facility] at TBD (30-45 minutes prior to test start time). ?  ?[facility] ?[address] ?[place], [place] ?Please stop at the Registration desk to check in  ?Magnetic resonance imaging (MRI) is a painless test that produces images of the inside of the body without using X-rays. During an MRI, strong magnets and radio waves work together in a [employer], political party to form detailed images. MRI images may provide more details about a medical condition than X-rays, CT scans, and ultrasounds can provide.  ?You may be given earphones to listen for instructions.  ?You may eat a light breakfast and take medications as ordered with the exception of Metformin, Glipizide and Lasix   ?If a contrast material will be used, an IV will be inserted into one of your veins. Contrast material will be injected into your IV.  ?You will be asked to remove all metal, including: Watch, jewelry, and other metal objects including hearing aids, hair pieces and dentures. (Braces and fillings normally are not a problem.)  ?If contrast material was used:  ?  It will leave your body through your urine within a day. You may be told to drink plenty of fluids to help flush the contrast material out of your system.  ?TEST WILL TAKE APPROXIMATELY 1 HOUR  ?PLEASE NOTIFY SCHEDULING AT LEAST 24 HOURS IN ADVANCE IF YOU ARE UNABLE TO KEEP YOUR APPOINTMENT.    ? ?Important Information About Sugar ? ? ? ? ? ? ?

## 2021-09-23 NOTE — Telephone Encounter (Signed)
ROI completed and $29 payment received via check.  ? ? ?Patient given completed forms at check in.  ?

## 2021-09-23 NOTE — Progress Notes (Signed)
?  ?Cardiology Office Note ? ? ?Date:  09/23/2021  ? ?ID:  AREG BIALAS, DOB [birth year], MRN [id number] ? ?PCP:  [name], MD  ?Cardiologist:   [name], MD  ? ?Chief Complaint  ?Patient presents with  ? Other  ?  Post cath c/o sob, dizziness and right wrist/elbow pain. Meds reviewed verbally with pt.  ? ?  ?History of Present Illness: ?Brett Marshall is a 56 y.o. male who is here today for a follow-up visit regarding coronary artery disease and suspected hypertrophic cardiomyopathy.   ?The patient has multiple chronic medical conditions that include type 2 diabetes, essential hypertension, morbid obesity, obstructive sleep apnea on CPAP and degenerative disc disease. ?  ?Right and left cardiac catheterization done in April of 2019 showed mild nonobstructive coronary artery disease, mildly reduced LV systolic function with an EF of 45 to 50% with global hypokinesis.  Right heart catheterization showed moderately elevated filling pressures with a wedge pressure of 22 mmHg, mild pulmonary hypertension and high cardiac output.  ?  ?He had a repeat echocardiogram done in September 2021 which showed an EF of 60 to 65% with grade 1 diastolic dysfunction. ? ?He was hospitalized in April with unstable angina.  Lexiscan Myoview showed evidence of anterior wall ischemia.  I proceeded with cardiac catheterization via the right ulnar artery which showed 95% ostial LAD stenosis due to plaque rupture and moderate proximal RCA disease that was stable from before.  LVEDP was mildly elevated.  There was moderate LVOT gradient of 25 to 30 mmHg.  I performed successful IVUS guided PCI and drug-eluting stent placement to the ostial LAD.  He had ecchymosis and small hematoma in the right arm.  Ultrasound showed no evidence of AV fistula or pseudoaneurysm.  He returned with recurrent shortness of breath and chest pain.  Repeat cardiac catheterization was done last week by Dr. Saunders Revel via the left radial artery which showed  patent LAD stent with no significant restenosis.  LVEDP was in the high normal range but there was significant LVOT gradient up to 80 mmHg at rest and 110 post PVC.  Based on this, carvedilol and enalapril were discontinued and the patient was started on metoprolol.  He feels better since then.  No recurrent chest pain.  He has not started cardiac rehab yet. ? ?Past Medical History:  ?Diagnosis Date  ? Asthma   ? CAD (coronary artery disease)   ? a. 08/2017 Cath: LM nl, LAD 30ost, 20m 30d, RI min irregs, LCX min irregs, RCA small, 40p, EF 45-50%, diff HK; b. 08/2021 MV: Ant/apical ishemia; c. 08/2021 PCI: LM nl, LAD 95ost (4.0x15 Onyx Frontier DES), 2110m30d, D2 min irregs, RI/LCX min irregs, RCA 50p, EF 55-65%.  ? DDD (degenerative disc disease), lumbar   ? Depression   ? Diabetes mellitus without complication ([facility]  ? Essential hypertension, benign   ? HFimpEF (heart failure with improved ejection fraction) ([facility]  ? a. 08/2017 LV gram: EF 45-50%, glob HK; b. 08/2021 Echo: EF 60-65%, no rwma, GrI DD, nl RV fxn.  ? Leg swelling   ? Left  ? Low back pain   ? Mixed ICM & NICM   ? a. 08/2017 LV gram: EF 45-50%, glob HK; b. 08/2021 Echo: EF 60-65%.  ? Morbid obesity ([facility]  ? OSA on CPAP   ? non compliant  ? Pulmonary hypertension ([facility]  ? ? ?Past Surgical History:  ?Procedure Laterality Date  ? CARDIAC  CATHETERIZATION    ? [place] no stents.  ? COLONOSCOPY WITH PROPOFOL N/A 04/11/2019  ? Procedure: COLONOSCOPY WITH PROPOFOL;  Surgeon: [name], MD;  Location: [facility] ENDOSCOPY;  Service: Endoscopy;  Laterality: N/A;  ? CORONARY STENT INTERVENTION N/A 09/08/2021  ? Procedure: CORONARY STENT INTERVENTION;  Surgeon: Wellington Hampshire, MD;  Location: [place] CV LAB;  Service: Cardiovascular;  Laterality: N/A;  ? INTRAVASCULAR ULTRASOUND/IVUS N/A 09/08/2021  ? Procedure: Intravascular Ultrasound/IVUS;  Surgeon: Wellington Hampshire, MD;  Location: [place] CV LAB;  Service: Cardiovascular;  Laterality: N/A;  ? LEFT  HEART CATH AND CORONARY ANGIOGRAPHY N/A 09/08/2021  ? Procedure: LEFT HEART CATH AND CORONARY ANGIOGRAPHY;  Surgeon: Wellington Hampshire, MD;  Location: [place] CV LAB;  Service: Cardiovascular;  Laterality: N/A;  ? LEFT HEART CATH AND CORONARY ANGIOGRAPHY N/A 09/17/2021  ? Procedure: LEFT HEART CATH AND CORONARY ANGIOGRAPHY;  Surgeon: Nelva Bush, MD;  Location: [place] CV LAB;  Service: Cardiovascular;  Laterality: N/A;  ? RIGHT/LEFT HEART CATH AND CORONARY ANGIOGRAPHY N/A 09/02/2017  ? Procedure: RIGHT/LEFT HEART CATH AND CORONARY ANGIOGRAPHY;  Surgeon: Wellington Hampshire, MD;  Location: [place] CV LAB;  Service: Cardiovascular;  Laterality: N/A;  ? ? ? ?Current Outpatient Medications  ?Medication Sig Dispense Refill  ? albuterol (VENTOLIN HFA) 108 (90 Base) MCG/ACT inhaler Inhale 1-2 puffs into the lungs every 6 (six) hours as needed for wheezing or shortness of breath. 18 g 1  ? aspirin EC 81 MG EC tablet Take 1 tablet (81 mg total) by mouth daily. Swallow whole. 30 tablet 2  ? atorvastatin (LIPITOR) 40 MG tablet Take 1 tablet (40 mg total) by mouth daily at 6 PM. 30 tablet 2  ? cyclobenzaprine (FLEXERIL) 10 MG tablet Take 1 tablet (10 mg total) by mouth 3 (three) times daily as needed.For spasms 90 tablet 5  ? DULoxetine (CYMBALTA) 30 MG capsule Take 2 capsules (60 mg total) by mouth daily.    ? famotidine (PEPCID) 40 MG tablet Take 40 mg by mouth daily.     ? furosemide (LASIX) 20 MG tablet Take 1 tablet (20 mg total) by mouth daily as needed (for SOB or weight gain of 3 lbs overnight). 90 tablet 3  ? glipiZIDE (GLUCOTROL) 5 MG tablet Take 1 tablet (5 mg total) by mouth 2 (two) times daily before a meal. 180 tablet 1  ? glucose blood (GE100 BLOOD GLUCOSE TEST) test strip Use as instructed 100 each 12  ? JARDIANCE 25 MG TABS tablet Take 25 mg by mouth daily.    ? Lancets (ONETOUCH ULTRASOFT) lancets Check sugar twice daily 100 each 12  ? metFORMIN (GLUCOPHAGE) 1000 MG tablet Take 1 tablet  (1,000 mg total) by mouth 2 (two) times daily with a meal. 180 tablet 1  ? metoprolol tartrate (LOPRESSOR) 100 MG tablet Take 1 tablet (100 mg total) by mouth 2 (two) times daily. 60 tablet 0  ? ticagrelor (BRILINTA) 90 MG TABS tablet Take 1 tablet (90 mg total) by mouth 2 (two) times daily. 60 tablet 0  ? traZODone (DESYREL) 50 MG tablet Take 50-100 mg by mouth at bedtime.    ? ?No current facility-administered medications for this visit.  ? ? ?Allergies:   Patient has no known allergies.  ? ? ?Social History:  The patient  reports that he has never smoked. He has never used smokeless tobacco. He reports current alcohol use. He reports that he does not use drugs.  ? ?Family History:  The patient's family history includes Asthma in his mother; Diabetes in his mother; Heart attack in his father; Heart disease in his father; Heart disease (age of onset: 78) in his brother; Sjogren's syndrome in his sister.  ? ? ?ROS:  Please see the history of present illness.   Otherwise, review of systems are positive for none.   All other systems are reviewed and negative.  ? ?PHYSICAL EXAM: ?VS:  BP 104/64 (BP Location: Left Arm, Patient Position: Sitting, Cuff Size: Large)   Pulse 70   Ht 5' 8"  (1.727 m)   Wt (!) 330 lb 6 oz (149.9 kg)   SpO2 99%   BMI 50.23 kg/m?  , BMI Body mass index is 50.23 kg/m?. ?GEN: Well nourished, well developed, in no acute distress  ?HEENT: normal  ?Neck: no JVD or masses.  No carotid bruits. ?Cardiac: RRR; norubs, or gallops.  1/6 systolic murmur at the aortic area.  Mild bilateral leg edema. ?Respiratory:  clear to auscultation bilaterally, normal work of breathing ?GI: soft, nontender, nondistended, + BS ?MS: no deformity or atrophy  ?Skin: warm and dry, no rash ?Neuro:  Strength and sensation are intact ?Psych: euthymic mood, full affect ?Ecchymosis involving the right forearm but no significant hematoma.  Normal left radial pulse with mild ecchymosis. ? ?EKG:  EKG is ordered today. ?The ekg  ordered today demonstrates normal sinus rhythm with low voltage. ? ? ?Recent Labs: ?09/17/2021: B Natriuretic Peptide 26.4 ?09/18/2021: BUN 22; Creatinine, Ser 1.21; Hemoglobin 13.5; Magnesium 1.9; Platelets 252; [place]

## 2021-09-24 ENCOUNTER — Telehealth: Payer: Self-pay | Admitting: Cardiovascular Disease

## 2021-09-24 NOTE — Telephone Encounter (Signed)
Per checkout patient needs 4 week fu but needs to be after testing .   ? ?MRI pending .  Needs fu .  ?

## 2021-09-25 NOTE — Telephone Encounter (Signed)
Holding appt with Sharolyn Douglas on 6/7 at 245  ?

## 2021-09-25 NOTE — Telephone Encounter (Signed)
Mri still pending .  ?

## 2021-09-26 NOTE — Telephone Encounter (Signed)
Scheduled

## 2021-09-29 ENCOUNTER — Emergency Department: Payer: BLUE CROSS/BLUE SHIELD

## 2021-09-29 ENCOUNTER — Other Ambulatory Visit: Payer: Self-pay

## 2021-09-29 ENCOUNTER — Encounter: Payer: Self-pay | Admitting: Emergency Medicine

## 2021-09-29 ENCOUNTER — Emergency Department
Admission: EM | Admit: 2021-09-29 | Discharge: 2021-09-29 | Disposition: A | Discharge status: Home / Self Care | Payer: BLUE CROSS/BLUE SHIELD | Attending: Emergency Medicine | Admitting: Emergency Medicine

## 2021-09-29 DIAGNOSIS — R0789 Other chest pain: Secondary | ICD-10-CM | POA: Insufficient documentation

## 2021-09-29 DIAGNOSIS — R079 Chest pain, unspecified: Secondary | ICD-10-CM

## 2021-09-29 DIAGNOSIS — I10 Essential (primary) hypertension: Secondary | ICD-10-CM | POA: Diagnosis not present

## 2021-09-29 DIAGNOSIS — E119 Type 2 diabetes mellitus without complications: Secondary | ICD-10-CM | POA: Diagnosis not present

## 2021-09-29 DIAGNOSIS — I2510 Atherosclerotic heart disease of native coronary artery without angina pectoris: Secondary | ICD-10-CM | POA: Diagnosis not present

## 2021-09-29 LAB — BASIC METABOLIC PANEL
Anion gap: 11 (ref 5–15)
BUN: 24 mg/dL — ABNORMAL HIGH (ref 6–20)
CO2: 21 mmol/L — ABNORMAL LOW (ref 22–32)
Calcium: 9.2 mg/dL (ref 8.9–10.3)
Chloride: 105 mmol/L (ref 98–111)
Creatinine, Ser: 1.21 mg/dL (ref 0.61–1.24)
GFR, Estimated: >60 mL/min (ref >60)
Glucose, Bld: 181 mg/dL — ABNORMAL HIGH (ref 70–99)
Potassium: 4.3 mmol/L (ref 3.5–5.1)
Sodium: 137 mmol/L (ref 135–145)

## 2021-09-29 LAB — CBC
HCT: 42.9 % (ref 39.0–52.0)
Hemoglobin: 14 g/dL (ref 13.0–17.0)
MCH: 27.6 pg (ref 26.0–34.0)
MCHC: 32.6 g/dL (ref 30.0–36.0)
MCV: 84.6 fL (ref 80.0–100.0)
Platelets: 247 10*3/uL (ref 150–400)
RBC: 5.07 MIL/uL (ref 4.22–5.81)
RDW: 15 % (ref 11.5–15.5)
WBC: 10.3 10*3/uL (ref 4.0–10.5)
nRBC: 0 % (ref 0.0–0.2)

## 2021-09-29 LAB — TROPONIN I (HIGH SENSITIVITY): Troponin I (High Sensitivity): 3 ng/L (ref <18)

## 2021-09-29 MED ORDER — TRAMADOL HCL 50 MG PO TABS: 50.0000 mg | ORAL_TABLET | Freq: Four times a day (QID) | ORAL | 0 refills | Status: AC | PRN

## 2021-09-29 NOTE — ED Triage Notes (Signed)
Pt to ED via POV for chest pain that started yesterday. Pt states that he was at home relaxing when the pain started. Pt reports pain in the center to left chest. Pt also states that he has had shortness of breath but states that this is an ongoing issues and that this is not worse than it has been. Pt reports that this morning that pain started to radiate into his left arm pit. Pt is currently in NAD and vital signs are stable.  ?

## 2021-09-29 NOTE — ED Provider Notes (Signed)
? ?[facility] ?Provider Note ? ? ? Event Date/Time  ? First MD Initiated Contact with Patient 09/29/21 1002   ?  (approximate) ? ? ?History  ? ?Chest Pain ? ? ?HPI ? ?Brett Marshall is a 56 y.o. male with history of CAD, diabetes, hypertension who presents with complaints of chest discomfort.  Patient reports over 24 hours of sharp chest discomfort in the center of his chest.  He does have a history of CAD, had a stent placed several weeks ago by Dr. Eliseo Squires and subsequently who had a repeat heart cath later which demonstrated reassuring findings. ? ?Denies shortness of breath.  No fevers or chills.  No calf pain or swelling ?  ? ? ?Physical Exam  ? ?Triage Vital Signs: ?ED Triage Vitals  ?Enc Vitals Group  ?   BP 09/29/21 0833 116/67  ?   Pulse Rate 09/29/21 0833 77  ?   Resp 09/29/21 0833 16  ?   Temp 09/29/21 0833 98.2 ?F (36.8 ?C)  ?   Temp Source 09/29/21 0833 Oral  ?   SpO2 09/29/21 0833 100 %  ?   Weight 09/29/21 0834 (!) 149.2 kg (329 lb)  ?   Height 09/29/21 0834 1.727 m (5' 8" )  ?   Head Circumference --   ?   Peak Flow --   ?   Pain Score 09/29/21 0833 5  ?   Pain Loc --   ?   Pain Edu? --   ?   Excl. in [place]? --   ? ? ?Most recent vital signs: ?Vitals:  ? 09/29/21 0833 09/29/21 1109  ?BP: 116/67 100/67  ?Pulse: 77 69  ?Resp: 16 18  ?Temp: 98.2 ?F (36.8 ?C) 97.8 ?F (36.6 ?C)  ?SpO2: 100% 96%  ? ? ? ?General: Awake, no distress.  ?CV:  Good peripheral perfusion.  ?Resp:  Normal effort.  ?Abd:  No distention.  ?Other:   ? ? ?ED Results / Procedures / Treatments  ? ?Labs ?(all labs ordered are listed, but only abnormal results are displayed) ?Labs Reviewed  ?BASIC METABOLIC PANEL - Abnormal; Notable for the following components:  ?    Result Value  ? CO2 21 (*)   ? Glucose, Bld 181 (*)   ? BUN 24 (*)   ? All other components within normal limits  ?CBC  ?TROPONIN I (HIGH SENSITIVITY)  ? ? ? ?EKG ? ?ED ECG REPORT ?I, Lavonia Drafts, the attending physician, personally viewed and interpreted  this ECG. ? ?Date: 09/29/2021 ? ?Rhythm: normal sinus rhythm ?QRS Axis: normal ?Intervals: normal ?ST/T Wave abnormalities: normal ?Narrative Interpretation: no evidence of acute ischemia ? ? ? ?RADIOLOGY ?Chest x-ray interpreted by me, no acute abnormality ? ? ? ?PROCEDURES: ? ?Critical Care performed:  ? ?Procedures ? ? ?MEDICATIONS ORDERED IN ED: ?Medications - No data to display ? ? ?IMPRESSION / MDM / ASSESSMENT AND PLAN / ED COURSE  ?I reviewed the triage vital signs and the nursing notes. ? ?Patient presents with chest pain as above. ? ?Differential includes ACS, angina, musculoskeletal pain, pneumonia ? ?EKG is reassuring, high sensitive troponin is normal.  Lab work is otherwise unremarkable.  Chest x-ray without pneumothorax or pneumonia ? ? ? ?Considered admission and discussed with Dr. Fletcher Anon of cardiology but we agreed that given reassuring work-up, recent reassuring heart catheterization no indication for admission at this time. ? ?We will discharge the patient on brief course of tramadol for pain, he will follow-up  with cardiology ? ? ? ? ? ?  ? ? ?FINAL CLINICAL IMPRESSION(S) / ED DIAGNOSES  ? ?Final diagnoses:  ?Nonspecific chest pain  ? ? ? ?Rx / DC Orders  ? ?ED Discharge Orders   ? ?      Ordered  ?  traMADol (ULTRAM) 50 MG tablet  Every 6 hours PRN       ? 09/29/21 1103  ? ?  ?  ? ?  ? ? ? ?Note:  This document was prepared using Dragon voice recognition software and may include unintentional dictation errors. ?  ?Lavonia Drafts, MD ?09/29/21 1427 ? ?

## 2021-09-29 NOTE — ED Provider Triage Note (Signed)
Emergency Medicine Provider Triage Evaluation Note ? ?[name] , a 56 y.o. male  was evaluated in triage.  Pt complains of acute on chronic chest pain.  Chronic dyspnea.  Multiple recent left heart cath.  Stent to the LAD 3 weeks ago.  Pain is reproducible.. ? ?Review of Systems  ?Positive:  ?Negative:  ? ?Physical Exam  ?BP 116/67 (BP Location: Left Arm)   Pulse 77   Temp 98.2 ?F (36.8 ?C) (Oral)   Resp 16   Ht 5' 8"  (1.727 m)   Wt (!) 149.2 kg   SpO2 100%   BMI 50.02 kg/m?  ?Gen:   Awake, no distress   ?Resp:  Normal effort  ?MSK:   Moves extremities without difficulty  ?Other:   ? ?Medical Decision Making  ?Medically screening exam initiated at 8:39 AM.  Appropriate orders placed.  JEMEL ONO was informed that the remainder of the evaluation will be completed by another provider, this initial triage assessment does not replace that evaluation, and the importance of remaining in the ED until their evaluation is complete. ? ? ?  ?Vladimir Crofts, MD ?09/29/21 [phone] ? ?

## 2021-10-02 ENCOUNTER — Other Ambulatory Visit: Payer: Self-pay | Admitting: Cardiovascular Disease

## 2021-10-02 DIAGNOSIS — I421 Obstructive hypertrophic cardiomyopathy: Secondary | ICD-10-CM | POA: Diagnosis not present

## 2021-10-02 DIAGNOSIS — I5022 Chronic systolic (congestive) heart failure: Secondary | ICD-10-CM | POA: Diagnosis not present

## 2021-10-02 DIAGNOSIS — R0789 Other chest pain: Secondary | ICD-10-CM | POA: Diagnosis not present

## 2021-10-02 DIAGNOSIS — Z23 Encounter for immunization: Secondary | ICD-10-CM | POA: Diagnosis not present

## 2021-10-02 DIAGNOSIS — I25110 Atherosclerotic heart disease of native coronary artery with unstable angina pectoris: Secondary | ICD-10-CM | POA: Diagnosis not present

## 2021-10-03 ENCOUNTER — Other Ambulatory Visit: Payer: Self-pay

## 2021-10-03 MED ORDER — TICAGRELOR 90 MG PO TABS: 90.0000 mg | ORAL_TABLET | Freq: Two times a day (BID) | ORAL | 0 refills | Status: DC

## 2021-10-08 ENCOUNTER — Encounter: Payer: Self-pay | Admitting: *Deleted

## 2021-10-08 ENCOUNTER — Encounter: Payer: BLUE CROSS/BLUE SHIELD | Attending: Cardiovascular Disease | Admitting: *Deleted

## 2021-10-08 DIAGNOSIS — Z955 Presence of coronary angioplasty implant and graft: Secondary | ICD-10-CM

## 2021-10-08 NOTE — Progress Notes (Signed)
Virtual orientation call completed today. he has an appointment on Date: 10/21/2021  for EP eval and gym Orientation.  Documentation of diagnosis can be found in [facility] Date: 09/04/2021 .

## 2021-10-14 ENCOUNTER — Telehealth (HOSPITAL_COMMUNITY): Payer: Self-pay | Admitting: *Deleted

## 2021-10-14 NOTE — Telephone Encounter (Signed)
Reaching out to patient to offer assistance regarding upcoming cardiac imaging study; pt verbalizes understanding of appt date/time, parking situation and where to check in, and verified current allergies; name and call back number provided for further questions should they arise  Gordy Clement RN [facility] [phone] office [phone] cell  Patient states he's had MRI's before without any issues. Reports a stent in his heart.

## 2021-10-15 ENCOUNTER — Ambulatory Visit
Admission: RE | Admit: 2021-10-15 | Discharge: 2021-10-15 | Disposition: A | Discharge status: Home / Self Care | Payer: BLUE CROSS/BLUE SHIELD | Source: Ambulatory Visit | Attending: Cardiovascular Disease | Admitting: Cardiovascular Disease

## 2021-10-15 DIAGNOSIS — I422 Other hypertrophic cardiomyopathy: Secondary | ICD-10-CM | POA: Diagnosis not present

## 2021-10-15 MED ORDER — GADOBUTROL 1 MMOL/ML IV SOLN
18.0000 mL | Freq: Once | INTRAVENOUS | Status: AC | PRN
  Administered 2021-10-15: 10 mL via INTRAVENOUS

## 2021-10-21 ENCOUNTER — Encounter: Payer: BLUE CROSS/BLUE SHIELD | Attending: Cardiovascular Disease | Admitting: *Deleted

## 2021-10-21 VITALS — Ht 69.5 in | Wt 329.5 lb

## 2021-10-21 DIAGNOSIS — Z955 Presence of coronary angioplasty implant and graft: Secondary | ICD-10-CM | POA: Insufficient documentation

## 2021-10-21 NOTE — Progress Notes (Signed)
Cardiac Individual Treatment Plan  Patient Details  Name: Brett Marshall MRN: [id number] Date of Birth: Feb 13, 1966 Referring Provider:   Flowsheet Row Cardiac Rehab from 10/21/2021 in [facility] Cardiac and Pulmonary Rehab  Referring Provider [name] MD       Initial Encounter Date:  Flowsheet Row Cardiac Rehab from 10/21/2021 in [facility] Cardiac and Pulmonary Rehab  Date 10/21/21       Visit Diagnosis: Status post coronary artery stent placement  Patient's Home Medications on Admission:  Current Outpatient Medications:    albuterol (VENTOLIN HFA) 108 (90 Base) MCG/ACT inhaler, Inhale 1-2 puffs into the lungs every 6 (six) hours as needed for wheezing or shortness of breath., Disp: 18 g, Rfl: 1   aspirin EC 81 MG EC tablet, Take 1 tablet (81 mg total) by mouth daily. Swallow whole., Disp: 30 tablet, Rfl: 2   atorvastatin (LIPITOR) 40 MG tablet, Take 1 tablet (40 mg total) by mouth daily at 6 PM., Disp: 30 tablet, Rfl: 2   cyclobenzaprine (FLEXERIL) 10 MG tablet, Take 1 tablet (10 mg total) by mouth 3 (three) times daily as needed.For spasms, Disp: 90 tablet, Rfl: 5   DULoxetine (CYMBALTA) 30 MG capsule, Take 2 capsules (60 mg total) by mouth daily., Disp: , Rfl:    famotidine (PEPCID) 40 MG tablet, Take 40 mg by mouth daily. , Disp: , Rfl:    furosemide (LASIX) 20 MG tablet, Take 1 tablet (20 mg total) by mouth daily as needed (for SOB or weight gain of 3 lbs overnight)., Disp: 90 tablet, Rfl: 3   glipiZIDE (GLUCOTROL) 5 MG tablet, Take 1 tablet (5 mg total) by mouth 2 (two) times daily before a meal., Disp: 180 tablet, Rfl: 1   glucose blood (GE100 BLOOD GLUCOSE TEST) test strip, Use as instructed, Disp: 100 each, Rfl: 12   JARDIANCE 25 MG TABS tablet, Take 25 mg by mouth daily., Disp: , Rfl:    Lancets (ONETOUCH ULTRASOFT) lancets, Check sugar twice daily, Disp: 100 each, Rfl: 12   metFORMIN (GLUCOPHAGE) 1000 MG tablet, Take 1 tablet (1,000 mg total) by mouth 2 (two) times daily with a  meal., Disp: 180 tablet, Rfl: 1   metoprolol tartrate (LOPRESSOR) 100 MG tablet, Take 1 tablet (100 mg total) by mouth 2 (two) times daily., Disp: 60 tablet, Rfl: 0   ticagrelor (BRILINTA) 90 MG TABS tablet, Take 1 tablet (90 mg total) by mouth 2 (two) times daily., Disp: 60 tablet, Rfl: 0   traMADol (ULTRAM) 50 MG tablet, Take 1 tablet (50 mg total) by mouth every 6 (six) hours as needed., Disp: 20 tablet, Rfl: 0   traZODone (DESYREL) 50 MG tablet, Take 50-100 mg by mouth at bedtime., Disp: , Rfl:   Past Medical History: Past Medical History:  Diagnosis Date   Asthma    CAD (coronary artery disease)    a. 08/2017 Cath: LM nl, LAD 30ost, 15m 30d, RI min irregs, LCX min irregs, RCA small, 40p, EF 45-50%, diff HK; b. 08/2021 MV: Ant/apical ishemia; c. 08/2021 PCI: LM nl, LAD 95ost (4.0x15 Onyx Frontier DES), 255m30d, D2 min irregs, RI/LCX min irregs, RCA 50p, EF 55-65%.   DDD (degenerative disc disease), lumbar    Depression    Diabetes mellitus without complication ([facility]   Essential hypertension, benign    HFimpEF (heart failure with improved ejection fraction) (HCTatums   a. 08/2017 LV gram: EF 45-50%, glob HK; b. 08/2021 Echo: EF 60-65%, no rwma, GrI DD, nl RV fxn.  Leg swelling    Left   Low back pain    Mixed ICM & NICM    a. 08/2017 LV gram: EF 45-50%, glob HK; b. 08/2021 Echo: EF 60-65%.   Morbid obesity (HCC)    OSA on CPAP    non compliant   Pulmonary hypertension (HCC)     Tobacco Use: Social History   Tobacco Use  Smoking Status Never  Smokeless Tobacco Never    Labs: Review Flowsheet        Latest Ref Rng & Units 02/15/2017 05/24/2017 08/23/2017 12/08/2017  Labs for ITP Cardiac and Pulmonary Rehab  Cholestrol 0 - 200 mg/dL    176    LDL (calc) 0 - 99 mg/dL    103    HDL-C >40 mg/dL    44    Trlycerides <150 mg/dL    143    Hemoglobin A1c 4.8 - 5.6 % 8.1   8.1  C 6.6   6.9       09/07/2021  Labs for ITP Cardiac and Pulmonary Rehab  Cholestrol 145    LDL (calc) 79     HDL-C 43    Trlycerides 113    Hemoglobin A1c 7.5       C Corrected result         Exercise Target Goals: Exercise Program Goal: Individual exercise prescription set using results from initial 6 min walk test and THRR while considering  patient's activity barriers and safety.   Exercise Prescription Goal: Initial exercise prescription builds to 30-45 minutes a day of aerobic activity, 2-3 days per week.  Home exercise guidelines will be given to patient during program as part of exercise prescription that the participant will acknowledge.   Education: Aerobic Exercise: - Group verbal and visual presentation on the components of exercise prescription. Introduces F.I.T.T principle from ACSM for exercise prescriptions.  Reviews F.I.T.T. principles of aerobic exercise including progression. Written material given at graduation.   Education: Resistance Exercise: - Group verbal and visual presentation on the components of exercise prescription. Introduces F.I.T.T principle from ACSM for exercise prescriptions  Reviews F.I.T.T. principles of resistance exercise including progression. Written material given at graduation.    Education: Exercise & Equipment Safety: - Individual verbal instruction and demonstration of equipment use and safety with use of the equipment. Flowsheet Row Cardiac Rehab from 10/21/2021 in [facility] Cardiac and Pulmonary Rehab  Date 10/21/21  Educator [facility]  Instruction Review Code 1- Verbalizes Understanding       Education: Exercise Physiology & General Exercise Guidelines: - Group verbal and written instruction with models to review the exercise physiology of the cardiovascular system and associated critical values. Provides general exercise guidelines with specific guidelines to those with heart or lung disease.    Education: Flexibility, Balance, Mind/Body Relaxation: - Group verbal and visual presentation with interactive activity on the components of exercise  prescription. Introduces F.I.T.T principle from ACSM for exercise prescriptions. Reviews F.I.T.T. principles of flexibility and balance exercise training including progression. Also discusses the mind body connection.  Reviews various relaxation techniques to help reduce and manage stress (i.e. Deep breathing, progressive muscle relaxation, and visualization). Balance handout provided to take home. Written material given at graduation.   Activity Barriers & Risk Stratification:  Activity Barriers & Cardiac Risk Stratification - 10/21/21 1028       Activity Barriers & Cardiac Risk Stratification   Activity Barriers Deconditioning;Muscular Weakness;Shortness of Breath;Balance Concerns;Neck/Spine Problems;Joint Problems   DDD in neck and spine, L shoulder limited  Cardiac Risk Stratification Moderate             6 Minute Walk:  6 Minute Walk     Row Name 10/21/21 1027         6 Minute Walk   Phase Initial     Distance 1060 feet     Walk Time 6 minutes     # of Rest Breaks 0     MPH 2     METS 2.16     RPE 14     Perceived Dyspnea  3     VO2 Peak 7.56     Symptoms Yes (comment)     Comments chest tightness 6/10, SOB     Resting HR 70 bpm     Resting BP 124/64     Resting Oxygen Saturation  98 %     Exercise Oxygen Saturation  during 6 min walk 100 %     Max Ex. HR 109 bpm     Max Ex. BP 128/64     2 Minute Post BP 114/66              Oxygen Initial Assessment:   Oxygen Re-Evaluation:   Oxygen Discharge (Final Oxygen Re-Evaluation):   Initial Exercise Prescription:  Initial Exercise Prescription - 10/21/21 1000       Date of Initial Exercise RX and Referring Provider   Date 10/21/21    Referring Provider [name] MD      Oxygen   Maintain Oxygen Saturation 88% or higher      Treadmill   MPH 1.8    Grade 0.5    Minutes 15    METs 2.52      REL-XR   Level 1    Speed 50    Minutes 15    METs 2.5      T5 Nustep   Level 2    SPM 80     Minutes 15    METs 2.5      Biostep-RELP   Level 2    SPM 50    Minutes 15    METs 2      Track   Laps 28    Minutes 15    METs 2.52      Prescription Details   Frequency (times per week) 2    Duration Progress to 30 minutes of continuous aerobic without signs/symptoms of physical distress      Intensity   THRR 40-80% of Max Heartrate 108-145    Ratings of Perceived Exertion 11-13    Perceived Dyspnea 0-4      Progression   Progression Continue to progress workloads to maintain intensity without signs/symptoms of physical distress.      Resistance Training   Training Prescription Yes    Weight 4 lb    Reps 10-15             Perform Capillary Blood Glucose checks as needed.  Exercise Prescription Changes:   Exercise Prescription Changes     Row Name 10/21/21 1000             Response to Exercise   Blood Pressure (Admit) 124/64       Blood Pressure (Exercise) 128/64       Blood Pressure (Exit) 114/66       Heart Rate (Admit) 70 bpm       Heart Rate (Exercise) 109 bpm       Heart Rate (Exit) 80 bpm  Oxygen Saturation (Admit) 98 %       Oxygen Saturation (Exercise) 100 %       Rating of Perceived Exertion (Exercise) 14       Perceived Dyspnea (Exercise) 3       Symptoms SOB, chest tightness 6/10       Comments walk test results                Exercise Comments:   Exercise Goals and Review:   Exercise Goals     Row Name 10/21/21 1031             Exercise Goals   Increase Physical Activity Yes       Intervention Provide advice, education, support and counseling about physical activity/exercise needs.;Develop an individualized exercise prescription for aerobic and resistive training based on initial evaluation findings, risk stratification, comorbidities and participant's personal goals.       Expected Outcomes Long Term: Add in home exercise to make exercise part of routine and to increase amount of physical activity.;Short Term:  Attend rehab on a regular basis to increase amount of physical activity.;Long Term: Exercising regularly at least 3-5 days a week.       Increase Strength and Stamina Yes       Intervention Provide advice, education, support and counseling about physical activity/exercise needs.;Develop an individualized exercise prescription for aerobic and resistive training based on initial evaluation findings, risk stratification, comorbidities and participant's personal goals.       Expected Outcomes Short Term: Increase workloads from initial exercise prescription for resistance, speed, and METs.;Short Term: Perform resistance training exercises routinely during rehab and add in resistance training at home;Long Term: Improve cardiorespiratory fitness, muscular endurance and strength as measured by increased METs and functional capacity (6MWT)       Able to understand and use rate of perceived exertion (RPE) scale Yes       Intervention Provide education and explanation on how to use RPE scale       Expected Outcomes Short Term: Able to use RPE daily in rehab to express subjective intensity level;Long Term:  Able to use RPE to guide intensity level when exercising independently       Able to understand and use Dyspnea scale Yes       Intervention Provide education and explanation on how to use Dyspnea scale       Expected Outcomes Short Term: Able to use Dyspnea scale daily in rehab to express subjective sense of shortness of breath during exertion;Long Term: Able to use Dyspnea scale to guide intensity level when exercising independently       Knowledge and understanding of Target Heart Rate Range (THRR) Yes       Intervention Provide education and explanation of THRR including how the numbers were predicted and where they are located for reference       Expected Outcomes Short Term: Able to state/look up THRR;Short Term: Able to use daily as guideline for intensity in rehab;Long Term: Able to use THRR to govern  intensity when exercising independently       Able to check pulse independently Yes       Intervention Provide education and demonstration on how to check pulse in carotid and radial arteries.;Review the importance of being able to check your own pulse for safety during independent exercise       Expected Outcomes Short Term: Able to explain why pulse checking is important during independent exercise;Long Term: Able to check pulse  independently and accurately       Understanding of Exercise Prescription Yes       Intervention Provide education, explanation, and written materials on patient's individual exercise prescription       Expected Outcomes Short Term: Able to explain program exercise prescription;Long Term: Able to explain home exercise prescription to exercise independently                Exercise Goals Re-Evaluation :   Discharge Exercise Prescription (Final Exercise Prescription Changes):  Exercise Prescription Changes - 10/21/21 1000       Response to Exercise   Blood Pressure (Admit) 124/64    Blood Pressure (Exercise) 128/64    Blood Pressure (Exit) 114/66    Heart Rate (Admit) 70 bpm    Heart Rate (Exercise) 109 bpm    Heart Rate (Exit) 80 bpm    Oxygen Saturation (Admit) 98 %    Oxygen Saturation (Exercise) 100 %    Rating of Perceived Exertion (Exercise) 14    Perceived Dyspnea (Exercise) 3    Symptoms SOB, chest tightness 6/10    Comments walk test results             Nutrition:  Target Goals: Understanding of nutrition guidelines, daily intake of sodium <1571m, cholesterol <2077m calories 30% from fat and 7% or less from saturated fats, daily to have 5 or more servings of fruits and vegetables.  Education: All About Nutrition: -Group instruction provided by verbal, written material, interactive activities, discussions, models, and posters to present general guidelines for heart healthy nutrition including fat, fiber, MyPlate, the role of sodium in  heart healthy nutrition, utilization of the nutrition label, and utilization of this knowledge for meal planning. Follow up email sent as well. Written material given at graduation. Flow[facility]  Education need identified 10/21/21       Biometrics:  Pre Biometrics - 10/21/21 1031       Pre Biometrics   Height 5' 9.5" (1.765 m)    Weight 329 lb 8 oz (149.5 kg)    BMI (Calculated) 47.98    Single Leg Stand 3.5 seconds              Nutrition Therapy Plan and Nutrition Goals:  Nutrition Therapy & Goals - 10/21/21 1032       Intervention Plan   Intervention Prescribe, educate and counsel regarding individualized specific dietary modifications aiming towards targeted core components such as weight, hypertension, lipid management, diabetes, heart failure and other comorbidities.    Expected Outcomes Long Term Goal: Adherence to prescribed nutrition plan.;Short Term Goal: A plan has been developed with personal nutrition goals set during dietitian appointment.;Short Term Goal: Understand basic principles of dietary content, such as calories, fat, sodium, cholesterol and nutrients.             Nutrition Assessments:  MEDIFICTS Score Key: ?70 Need to make dietary changes  40-70 Heart Healthy Diet ? 40 Therapeutic Level Cholesterol Diet  [facility]  Picture Your Plate Total Score on Admission 53      Picture Your Plate Scores: <4<09nhealthy dietary pattern with much room for improvement. 41-50 Dietary pattern unlikely to meet recommendations for good health and room for improvement. 51-60 More healthful dietary pattern, with some room for improvement.  >60 Healthy dietary pattern, although there may be some specific behaviors that could be improved.  Nutrition Goals Re-Evaluation:   Nutrition Goals Discharge (Final Nutrition Goals  Re-Evaluation):   Psychosocial: Target Goals: Acknowledge presence or absence of significant depression and/or stress, maximize coping skills, provide positive support system. Participant is able to verbalize types and ability to use techniques and skills needed for reducing stress and depression.   Education: Stress, Anxiety, and Depression - Group verbal and visual presentation to define topics covered.  Reviews how body is impacted by stress, anxiety, and depression.  Also discusses healthy ways to reduce stress and to treat/manage anxiety and depression.  Written material given at graduation.   Education: Sleep Hygiene -Provides group verbal and written instruction about how sleep can affect your health.  Define sleep hygiene, discuss sleep cycles and impact of sleep habits. Review good sleep hygiene tips.    Initial Review & Psychosocial Screening:  Initial Psych Review & Screening - 10/08/21 1343       Initial Review   Current issues with None Identified      Family Dynamics   Good Support System? Yes   Wife,friends, church friends     Barriers   Psychosocial barriers to participate in program There are no identifiable barriers or psychosocial needs.      Screening Interventions   Interventions Encouraged to exercise;To provide support and resources with identified psychosocial needs;Provide feedback about the scores to participant    Expected Outcomes Short Term goal: Utilizing psychosocial counselor, staff and physician to assist with identification of specific Stressors or current issues interfering with healing process. Setting desired goal for each stressor or current issue identified.;Long Term Goal: Stressors or current issues are controlled or eliminated.;Short Term goal: Identification and review with participant of any Quality of Life or Depression concerns found by scoring the questionnaire.;Long Term goal: The participant improves quality of Life and PHQ9 Scores as seen  by post scores and/or verbalization of changes             Quality of Life Scores:   Quality of Life - 10/21/21 1032       Quality of Life   Select Quality of Life      Quality of Life Scores   Health/Function Pre 12.2 %    Socioeconomic Pre 22.93 %    Psych/Spiritual Pre 16.29 %    Family Pre 22.5 %    GLOBAL Pre 16.59 %            Scores of 19 and below usually indicate a poorer quality of life in these areas.  A difference of  2-3 points is a clinically meaningful difference.  A difference of 2-3 points in the total score of the Quality of Life Index has been associated with significant improvement in overall quality of life, self-image, physical symptoms, and general health in studies assessing change in quality of life.  PHQ-9: Review Flowsheet        10/21/2021 12/08/2017 08/23/2017 05/24/2017 02/15/2017  Depression screen PHQ 2/9  Decreased Interest 2 0 0 0 0  Down, Depressed, Hopeless 0 0 0 0 0  PHQ - 2 Score 2 0 0 0 0  Altered sleeping 2      Tired, decreased energy 3      Change in appetite 2      Feeling bad or failure about yourself  0      Trouble concentrating 0      Moving slowly or fidgety/restless 1      Suicidal thoughts 0      PHQ-9 Score  10      Difficult doing work/chores Somewhat difficult             Interpretation of Total Score  Total Score Depression Severity:  1-4 = Minimal depression, 5-9 = Mild depression, 10-14 = Moderate depression, 15-19 = Moderately severe depression, 20-27 = Severe depression   Psychosocial Evaluation and Intervention:  Psychosocial Evaluation - 10/08/21 1401       Psychosocial Evaluation & Interventions   Interventions Encouraged to exercise with the program and follow exercise prescription    Comments Berish has no barriers to attending the program. He lives with his wife,. His wife, friends and church friends are his support team. Kaci is working on weight loss. He has lost over 100 lbs and is continuing the  weight loss journey. He wants to learn how to manage an exercise program and to continue with his weight loss. HIs long term goal is to reduce the anmount of medicine he is taking.    Expected Outcomes STG Tou is able to attend all scheduled sessions, he works with EP and RD to continue his weight loss journey, and he manages to progress to a routine exercise program LTG Raphael continues to progress with exercise and weight loss after discharge. He is able to in the longterm to decrease his medication needs    Continue Psychosocial Services  Follow up required by staff             Psychosocial Re-Evaluation:   Psychosocial Discharge (Final Psychosocial Re-Evaluation):   Vocational Rehabilitation: Provide vocational rehab assistance to qualifying candidates.   Vocational Rehab Evaluation & Intervention:   Education: Education Goals: Education classes will be provided on a variety of topics geared toward better understanding of heart health and risk factor modification. Participant will state understanding/return demonstration of topics presented as noted by education test scores.  Learning Barriers/Preferences:   General Cardiac Education Topics:  AED/CPR: - Group verbal and written instruction with the use of models to demonstrate the basic use of the AED with the basic ABC's of resuscitation.   Anatomy and Cardiac Procedures: - Group verbal and visual presentation and models provide information about basic cardiac anatomy and function. Reviews the testing methods done to diagnose heart disease and the outcomes of the test results. Describes the treatment choices: Medical Management, Angioplasty, or Coronary Bypass Surgery for treating various heart conditions including Myocardial Infarction, Angina, Valve Disease, and Cardiac Arrhythmias.  Written material given at graduation. Flowsheet Row Cardiac Rehab from 10/21/2021 in [facility] Cardiac and Pulmonary Rehab  Education need  identified 10/21/21       Medication Safety: - Group verbal and visual instruction to review commonly prescribed medications for heart and lung disease. Reviews the medication, class of the drug, and side effects. Includes the steps to properly store meds and maintain the prescription regimen.  Written material given at graduation.   Intimacy: - Group verbal instruction through game format to discuss how heart and lung disease can affect sexual intimacy. Written material given at graduation..   Know Your Numbers and Heart Failure: - Group verbal and visual instruction to discuss disease risk factors for cardiac and pulmonary disease and treatment options.  Reviews associated critical values for Overweight/Obesity, Hypertension, Cholesterol, and Diabetes.  Discusses basics of heart failure: signs/symptoms and treatments.  Introduces Heart Failure Zone chart for action plan for heart failure.  Written material given at graduation.   Infection Prevention: - Provides verbal and written material to individual with discussion of infection  control including proper hand washing and proper equipment cleaning during exercise session. Flowsheet Row Cardiac Rehab from 10/21/2021 in [facility] Cardiac and Pulmonary Rehab  Date 10/21/21  Educator [facility]  Instruction Review Code 1- Verbalizes Understanding       Falls Prevention: - Provides verbal and written material to individual with discussion of falls prevention and safety. Flowsheet Row Cardiac Rehab from 10/21/2021 in [facility] Cardiac and Pulmonary Rehab  Date 10/08/21  Educator [facility]  Instruction Review Code 1- Verbalizes Understanding       Other: -Provides group and verbal instruction on various topics (see comments)   Knowledge Questionnaire Score:  Knowledge Questionnaire Score - 10/21/21 1033       Knowledge Questionnaire Score   Pre Score 24/26             Core Components/Risk Factors/Patient Goals at Admission:  Personal Goals and  Risk Factors at Admission - 10/21/21 1033       Core Components/Risk Factors/Patient Goals on Admission    Weight Management Yes;Obesity;Weight Loss    Intervention Weight Management: Develop a combined nutrition and exercise program designed to reach desired caloric intake, while maintaining appropriate intake of nutrient and fiber, sodium and fats, and appropriate energy expenditure required for the weight goal.;Weight Management/Obesity: Establish reasonable short term and long term weight goals.;Obesity: Provide education and appropriate resources to help participant work on and attain dietary goals.;Weight Management: Provide education and appropriate resources to help participant work on and attain dietary goals.    Admit Weight 329 lb 8 oz (149.5 kg)    Goal Weight: Short Term 324 lb (147 kg)    Goal Weight: Long Term 225 lb (102.1 kg)    Expected Outcomes Short Term: Continue to assess and modify interventions until short term weight is achieved;Long Term: Adherence to nutrition and physical activity/exercise program aimed toward attainment of established weight goal;Weight Loss: Understanding of general recommendations for a balanced deficit meal plan, which promotes 1-2 lb weight loss per week and includes a negative energy balance of [phone] kcal/d;Understanding recommendations for meals to include 15-35% energy as protein, 25-35% energy from fat, 35-60% energy from carbohydrates, less than 273m of dietary cholesterol, 20-35 gm of total fiber daily;Understanding of distribution of calorie intake throughout the day with the consumption of 4-5 meals/snacks    Diabetes Yes    Intervention Provide education about signs/symptoms and action to take for hypo/hyperglycemia.;Provide education about proper nutrition, including hydration, and aerobic/resistive exercise prescription along with prescribed medications to achieve blood glucose in normal ranges: Fasting glucose 65-99 mg/dL    Expected  Outcomes Short Term: Participant verbalizes understanding of the signs/symptoms and immediate care of hyper/hypoglycemia, proper foot care and importance of medication, aerobic/resistive exercise and nutrition plan for blood glucose control.;Long Term: Attainment of HbA1C < 7%.    Heart Failure Yes    Intervention Provide a combined exercise and nutrition program that is supplemented with education, support and counseling about heart failure. Directed toward relieving symptoms such as shortness of breath, decreased exercise tolerance, and extremity edema.    Expected Outcomes Short term: Attendance in program 2-3 days a week with increased exercise capacity. Reported lower sodium intake. Reported increased fruit and vegetable intake. Reports medication compliance.;Improve functional capacity of life;Short term: Daily weights obtained and reported for increase. Utilizing diuretic protocols set by physician.;Long term: Adoption of self-care skills and reduction of barriers for early signs and symptoms recognition and intervention leading to self-care maintenance.    Hypertension Yes    Intervention  Provide education on lifestyle modifcations including regular physical activity/exercise, weight management, moderate sodium restriction and increased consumption of fresh fruit, vegetables, and low fat dairy, alcohol moderation, and smoking cessation.;Monitor prescription use compliance.    Expected Outcomes Short Term: Continued assessment and intervention until BP is < 140/63m HG in hypertensive participants. < 130/888mHG in hypertensive participants with diabetes, heart failure or chronic kidney disease.;Long Term: Maintenance of blood pressure at goal levels.    Lipids Yes    Intervention Provide education and support for participant on nutrition & aerobic/resistive exercise along with prescribed medications to achieve LDL <7037mHDL >62m25m  Expected Outcomes Short Term: Participant states understanding of  desired cholesterol values and is compliant with medications prescribed. Participant is following exercise prescription and nutrition guidelines.;Long Term: Cholesterol controlled with medications as prescribed, with individualized exercise RX and with personalized nutrition plan. Value goals: LDL < 70mg75mL > 40 mg.             Education:Diabetes - Individual verbal and written instruction to review signs/symptoms of diabetes, desired ranges of glucose level fasting, after meals and with exercise. Acknowledge that pre and post exercise glucose checks will be done for 3 sessions at entry of program. [facility]  Date 10/21/21  Educator JH  [facility] Review Code 1- Verbalizes Understanding       Core Components/Risk Factors/Patient Goals Review:    Core Components/Risk Factors/Patient Goals at Discharge (Final Review):    ITP Comments:  ITP Comments     Row NDel Aire 10/08/21 1401 10/21/21 1027         ITP Comments Virtual orientation call completed today. he has an appointment on Date: 10/21/2021  for EP eval and gym Orientation.  [facility]: 09/04/2021 . Completed 6MWT and gym orientation. Initial ITP created and sent for review to Dr. Mark Emily Filbertical Director.               Comments: Initial ITP

## 2021-10-21 NOTE — Patient Instructions (Signed)
Patient Instructions  Patient Details  Name: Brett Marshall MRN: [id number] Date of Birth: 28-Aug-1965 Referring Provider:  Wellington Hampshire, MD  Below are your personal goals for exercise, nutrition, and risk factors. Our goal is to help you stay on track towards obtaining and maintaining these goals. We will be discussing your progress on these goals with you throughout the program.  Initial Exercise Prescription:  Initial Exercise Prescription - 10/21/21 1000       Date of Initial Exercise RX and Referring Provider   Date 10/21/21    Referring Provider [name] MD      Oxygen   Maintain Oxygen Saturation 88% or higher      Treadmill   MPH 1.8    Grade 0.5    Minutes 15    METs 2.52      REL-XR   Level 1    Speed 50    Minutes 15    METs 2.5      T5 Nustep   Level 2    SPM 80    Minutes 15    METs 2.5      Biostep-RELP   Level 2    SPM 50    Minutes 15    METs 2      Track   Laps 28    Minutes 15    METs 2.52      Prescription Details   Frequency (times per week) 2    Duration Progress to 30 minutes of continuous aerobic without signs/symptoms of physical distress      Intensity   THRR 40-80% of Max Heartrate 108-145    Ratings of Perceived Exertion 11-13    Perceived Dyspnea 0-4      Progression   Progression Continue to progress workloads to maintain intensity without signs/symptoms of physical distress.      Resistance Training   Training Prescription Yes    Weight 4 lb    Reps 10-15             Exercise Goals: Frequency: Be able to perform aerobic exercise two to three times per week in program working toward 2-5 days per week of home exercise.  Intensity: Work with a perceived exertion of 11 (fairly light) - 15 (hard) while following your exercise prescription.  We will make changes to your prescription with you as you progress through the program.   Duration: Be able to do 30 to 45 minutes of continuous aerobic exercise in  addition to a 5 minute warm-up and a 5 minute cool-down routine.   Nutrition Goals: Your personal nutrition goals will be established when you do your nutrition analysis with the dietician.  The following are general nutrition guidelines to follow: Cholesterol < 268m/day Sodium < 15058mday Fiber: Men over 50 yrs - 30 grams per day  Personal Goals:  Personal Goals and Risk Factors at Admission - 10/21/21 1033       Core Components/Risk Factors/Patient Goals on Admission    Weight Management Yes;Obesity;Weight Loss    Intervention Weight Management: Develop a combined nutrition and exercise program designed to reach desired caloric intake, while maintaining appropriate intake of nutrient and fiber, sodium and fats, and appropriate energy expenditure required for the weight goal.;Weight Management/Obesity: Establish reasonable short term and long term weight goals.;Obesity: Provide education and appropriate resources to help participant work on and attain dietary goals.;Weight Management: Provide education and appropriate resources to help participant work on and attain dietary goals.    Admit  Weight 329 lb 8 oz (149.5 kg)    Goal Weight: Short Term 324 lb (147 kg)    Goal Weight: Long Term 225 lb (102.1 kg)    Expected Outcomes Short Term: Continue to assess and modify interventions until short term weight is achieved;Long Term: Adherence to nutrition and physical activity/exercise program aimed toward attainment of established weight goal;Weight Loss: Understanding of general recommendations for a balanced deficit meal plan, which promotes 1-2 lb weight loss per week and includes a negative energy balance of [phone] kcal/d;Understanding recommendations for meals to include 15-35% energy as protein, 25-35% energy from fat, 35-60% energy from carbohydrates, less than 274m of dietary cholesterol, 20-35 gm of total fiber daily;Understanding of distribution of calorie intake throughout the day with  the consumption of 4-5 meals/snacks    Diabetes Yes    Intervention Provide education about signs/symptoms and action to take for hypo/hyperglycemia.;Provide education about proper nutrition, including hydration, and aerobic/resistive exercise prescription along with prescribed medications to achieve blood glucose in normal ranges: Fasting glucose 65-99 mg/dL    Expected Outcomes Short Term: Participant verbalizes understanding of the signs/symptoms and immediate care of hyper/hypoglycemia, proper foot care and importance of medication, aerobic/resistive exercise and nutrition plan for blood glucose control.;Long Term: Attainment of HbA1C < 7%.    Heart Failure Yes    Intervention Provide a combined exercise and nutrition program that is supplemented with education, support and counseling about heart failure. Directed toward relieving symptoms such as shortness of breath, decreased exercise tolerance, and extremity edema.    Expected Outcomes Short term: Attendance in program 2-3 days a week with increased exercise capacity. Reported lower sodium intake. Reported increased fruit and vegetable intake. Reports medication compliance.;Improve functional capacity of life;Short term: Daily weights obtained and reported for increase. Utilizing diuretic protocols set by physician.;Long term: Adoption of self-care skills and reduction of barriers for early signs and symptoms recognition and intervention leading to self-care maintenance.    Hypertension Yes    Intervention Provide education on lifestyle modifcations including regular physical activity/exercise, weight management, moderate sodium restriction and increased consumption of fresh fruit, vegetables, and low fat dairy, alcohol moderation, and smoking cessation.;Monitor prescription use compliance.    Expected Outcomes Short Term: Continued assessment and intervention until BP is < 140/928mHG in hypertensive participants. < 130/8030mG in hypertensive  participants with diabetes, heart failure or chronic kidney disease.;Long Term: Maintenance of blood pressure at goal levels.    Lipids Yes    Intervention Provide education and support for participant on nutrition & aerobic/resistive exercise along with prescribed medications to achieve LDL <55m90mDL >40mg59m Expected Outcomes Short Term: Participant states understanding of desired cholesterol values and is compliant with medications prescribed. Participant is following exercise prescription and nutrition guidelines.;Long Term: Cholesterol controlled with medications as prescribed, with individualized exercise RX and with personalized nutrition plan. Value goals: LDL < 55mg,50m > 40 mg.             Tobacco Use Initial Evaluation: Social History   Tobacco Use  Smoking Status Never  Smokeless Tobacco Never    Exercise Goals and Review:  Exercise Goals     Row Name 10/21/21 1031             Exercise Goals   Increase Physical Activity Yes       Intervention Provide advice, education, support and counseling about physical activity/exercise needs.;Develop an individualized exercise prescription for aerobic and resistive training based on initial evaluation findings, risk  stratification, comorbidities and participant's personal goals.       Expected Outcomes Long Term: Add in home exercise to make exercise part of routine and to increase amount of physical activity.;Short Term: Attend rehab on a regular basis to increase amount of physical activity.;Long Term: Exercising regularly at least 3-5 days a week.       Increase Strength and Stamina Yes       Intervention Provide advice, education, support and counseling about physical activity/exercise needs.;Develop an individualized exercise prescription for aerobic and resistive training based on initial evaluation findings, risk stratification, comorbidities and participant's personal goals.       Expected Outcomes Short Term: Increase  workloads from initial exercise prescription for resistance, speed, and METs.;Short Term: Perform resistance training exercises routinely during rehab and add in resistance training at home;Long Term: Improve cardiorespiratory fitness, muscular endurance and strength as measured by increased METs and functional capacity (6MWT)       Able to understand and use rate of perceived exertion (RPE) scale Yes       Intervention Provide education and explanation on how to use RPE scale       Expected Outcomes Short Term: Able to use RPE daily in rehab to express subjective intensity level;Long Term:  Able to use RPE to guide intensity level when exercising independently       Able to understand and use Dyspnea scale Yes       Intervention Provide education and explanation on how to use Dyspnea scale       Expected Outcomes Short Term: Able to use Dyspnea scale daily in rehab to express subjective sense of shortness of breath during exertion;Long Term: Able to use Dyspnea scale to guide intensity level when exercising independently       Knowledge and understanding of Target Heart Rate Range (THRR) Yes       Intervention Provide education and explanation of THRR including how the numbers were predicted and where they are located for reference       Expected Outcomes Short Term: Able to state/look up THRR;Short Term: Able to use daily as guideline for intensity in rehab;Long Term: Able to use THRR to govern intensity when exercising independently       Able to check pulse independently Yes       Intervention Provide education and demonstration on how to check pulse in carotid and radial arteries.;Review the importance of being able to check your own pulse for safety during independent exercise       Expected Outcomes Short Term: Able to explain why pulse checking is important during independent exercise;Long Term: Able to check pulse independently and accurately       Understanding of Exercise Prescription Yes        Intervention Provide education, explanation, and written materials on patient's individual exercise prescription       Expected Outcomes Short Term: Able to explain program exercise prescription;Long Term: Able to explain home exercise prescription to exercise independently                Copy of goals given to participant.

## 2021-10-22 ENCOUNTER — Ambulatory Visit: Payer: BLUE CROSS/BLUE SHIELD | Admitting: Nurse Practitioner

## 2021-10-22 ENCOUNTER — Encounter: Payer: Self-pay | Admitting: Nurse Practitioner

## 2021-10-22 VITALS — BP 114/70 | HR 82 | Ht 68.0 in | Wt 330.4 lb

## 2021-10-22 DIAGNOSIS — I25118 Atherosclerotic heart disease of native coronary artery with other forms of angina pectoris: Secondary | ICD-10-CM

## 2021-10-22 DIAGNOSIS — I10 Essential (primary) hypertension: Secondary | ICD-10-CM | POA: Diagnosis not present

## 2021-10-22 DIAGNOSIS — E785 Hyperlipidemia, unspecified: Secondary | ICD-10-CM

## 2021-10-22 DIAGNOSIS — G4730 Sleep apnea, unspecified: Secondary | ICD-10-CM

## 2021-10-22 NOTE — Patient Instructions (Signed)
Medication Instructions:  No changes at this time.   *If you need a refill on your cardiac medications before your next appointment, please call your pharmacy*   Lab Work: None  If you have labs (blood work) drawn today and your tests are completely normal, you will receive your results only by: [place] (if you have MyChart) OR A paper copy in the mail If you have any lab test that is abnormal or we need to change your treatment, we will call you to review the results.   Testing/Procedures: None   Follow-Up: At [facility], you and your health needs are our priority.  As part of our continuing mission to provide you with exceptional heart care, we have created designated Provider Care Teams.  These Care Teams include your primary Cardiologist (physician) and Advanced Practice Providers (APPs -  Physician Assistants and Nurse Practitioners) who all work together to provide you with the care you need, when you need it.  We recommend signing up for the patient portal called "MyChart".  Sign up information is provided on this After Visit Summary.  MyChart is used to connect with patients for Virtual Visits (Telemedicine).  Patients are able to view lab/test results, encounter notes, upcoming appointments, etc.  Non-urgent messages can be sent to your provider as well.   To learn more about what you can do with MyChart, go to [email or web address].    Your next appointment:   3 month(s)  The format for your next appointment:   In Person  Provider:   [name], MD or Murray Hodgkins, NP    Other Instructions Referral placed for pulmonary with regards to sleep apnea  Important Information About Sugar

## 2021-10-22 NOTE — Progress Notes (Signed)
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  Past Medical History    Past Medical History:  Diagnosis Date   Asthma    CAD (coronary artery disease)    a. 08/2017 Cath: Diff nonobs dzs, EF 45-50%, diff HK; b. 08/2021 MV: Ant/apical ishemia; c. 08/2021 PCI: LAD 95ost (4.0x15 Onyx Frontier DES), EF 55-65%; c. 09/2021 Cath: LM nl, LAD patent stent, 26m 30d, RI Marshall irregs, LCX Marshall irregs, RCA 50p. LVOT grad 50-824mg (rest), 70-11023m (provoked).   DDD (degenerative disc disease), lumbar    Depression    Diabetes mellitus without complication ([place]  Essential hypertension, benign    HFimpEF (heart failure with improved ejection fraction) ([place]  a. 08/2017 LV gram: EF 45-50%, glob HK; b. 08/2021 Echo: EF 60-65%, no rwma, GrI DD, nl RV fxn; c. 09/2021 cMRI: EF 60%, no LVOT obstruction/gradient. Mild conc LVH, mild AS. No myocardial scar/fibrosis. No evidence of HCM.   Leg swelling    Left   Low back pain    Mixed ICM & NICM    a. 08/2017 LV gram: EF 45-50%, glob HK; b. 08/2021 Echo: EF 60-65%.   Morbid obesity (HCC)    OSA on CPAP    non compliant   Pulmonary hypertension ([place]CC[place]  Past Surgical History:  Procedure Laterality Date   CARDIAC CATHETERIZATION     [place] no stents.   COLONOSCOPY WITH PROPOFOL N/A 04/11/2019   Procedure: COLONOSCOPY WITH PROPOFOL;  Surgeon: WohLucilla LameD;  [facility];  Service: Endoscopy;  Laterality: N/A;   CORONARY STENT INTERVENTION N/A 09/08/2021   Procedure: CORONARY STENT INTERVENTION;  Surgeon: AriWellington HampshireD;  [facility];  Service: Cardiovascular;  Laterality: N/A;   INTRAVASCULAR ULTRASOUND/IVUS N/A 09/08/2021   Procedure: Intravascular Ultrasound/IVUS;  Surgeon: AriWellington HampshireD;  [facility];  Service: Cardiovascular;  Laterality: N/A;   LEFT HEART CATH AND CORONARY ANGIOGRAPHY N/A 09/08/2021   Procedure: LEFT HEART CATH AND CORONARY ANGIOGRAPHY;  Surgeon: AriWellington HampshireD;  [facility];  Service: Cardiovascular;  Laterality: N/A;   LEFT HEART CATH AND CORONARY ANGIOGRAPHY N/A 09/17/2021   Procedure: LEFT HEART CATH AND CORONARY ANGIOGRAPHY;  Surgeon: EndNelva BushD;  [facility];  Service: Cardiovascular;  Laterality: N/A;   RIGHT/LEFT HEART CATH AND CORONARY ANGIOGRAPHY N/A 09/02/2017   Procedure: RIGHT/LEFT HEART CATH AND CORONARY ANGIOGRAPHY;  Surgeon: AriWellington HampshireD;  [facility];  Service: Cardiovascular;  Laterality: N/A;    Allergies  No Known Allergies  History of Present Illness    [age]ar old male with the above complex past medical history including CAD, 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  He previously underwent diagnostic catheterization in April 2019, which showed mild, nonobstructive CAD with an EF of 45 to 50%.  He had mildly elevated pulmonary filling pressures and has since been treated for sleep apnea.  In April 2023,  he was admitted to [facility] with a 10-day history of constant chest pain that was somewhat reproducible with palpation but also worse with exertion.  Troponins were normal.  Stress testing showed anterior and apical ischemia.  Diagnostic catheterization showed 95% stenosis in the ostial LAD, and otherwise nonobstructive disease.  The LAD was successfully intervened upon  with a drug-eluting stent, with improvement in exertional symptoms.  Catheterization was performed via the right ulnar approach, and he did have swelling and pain of his right forearm with paresthesias of the fingers on his right hand postprocedure.  Ultrasound showed a hematoma within the superficial soft tissues of the right upper extremity however, no evidence of pseudoaneurysm or AV fistula.  He returned to the ED 2 days later due to ongoing right forearm swelling and ecchymosis.  Repeat ultrasound again showed soft tissue hematoma measuring up to 5.1 cm, which was largely unchanged, and he was discharged home.  At office follow-up on May 2, he reported recurrent constant, low-level chest tightness that worsens with exertion, similar to what he experienced prior to stenting.  He was tachycardic and labs showed mild acute kidney injury with a creatinine of 1.41, mild leukocytosis with a WBC of 13.0, and an elevated D-dimer.  He was referred to the emergency department for hydration and CTA, which was negative for PE.  Carvedilol was titrated to 18.75 mg twice daily.  He was admitted a day later with refractory angina and underwent diagnostic catheterization which showed patent LAD stent and otherwise nonobstructive disease.  It was noted that he had an LVOT gradient of 50 to 80 mmHg at rest and 7200 mmHg with provocation.  He was stable at office follow-up May 9 however, reported to the ED again on May 15 due to chest pain with unremarkable lab and radiologic findings.  He subsequently underwent cardiac MRI on May 31 which showed normal LV function without evidence for hypertrophic cardiomyopathy or LVOT gradient.  Since his last office visit, he notes persistent dyspnea on exertion with minimal activity however, he is able to continue walking without progression of dyspnea.  Overall, he feels that this symptom has been stable.  He has not experienced chest pain in some time.  He denies palpitations, PND,  orthopnea, dizziness, syncope, edema, or early satiety.  He does often awaken in the middle of the night feeling like he is gasping for air or breathing quickly.  He does have history of sleep apnea but does not wear CPAP.  Home Medications    Current Outpatient Medications  Medication Sig Dispense Refill   albuterol (VENTOLIN HFA) 108 (90 Base) MCG/ACT inhaler Inhale 1-2 puffs into the lungs every 6 (six) hours as needed for wheezing or shortness of breath. 18 g 1   aspirin EC 81 MG EC tablet Take 1 tablet (81 mg total) by mouth daily. Swallow whole. 30 tablet 2   atorvastatin (LIPITOR) 40 MG tablet Take 1 tablet (40 mg total) by mouth daily at 6 PM. 30 tablet 2   cyclobenzaprine (FLEXERIL) 10 MG tablet Take 1 tablet (10 mg total) by mouth 3 (three) times daily as needed.For spasms 90 tablet 5   DULoxetine (CYMBALTA) 30 MG capsule Take 2 capsules (60 mg total) by mouth daily.     famotidine (PEPCID) 40 MG tablet Take 40 mg by mouth daily.      furosemide (LASIX) 20 MG tablet Take 1 tablet (20 mg total) by mouth daily as needed (for SOB or weight gain of 3  lbs overnight). 90 tablet 3   glipiZIDE (GLUCOTROL) 5 MG tablet Take 1 tablet (5 mg total) by mouth 2 (two) times daily before a meal. 180 tablet 1   glucose blood (GE100 BLOOD GLUCOSE TEST) test strip Use as instructed 100 each 12   JARDIANCE 25 MG TABS tablet Take 25 mg by mouth daily.     Lancets (ONETOUCH ULTRASOFT) lancets Check sugar twice daily 100 each 12   metFORMIN (GLUCOPHAGE) 1000 MG tablet Take 1 tablet (1,000 mg total) by mouth 2 (two) times daily with a meal. 180 tablet 1   metoprolol tartrate (LOPRESSOR) 100 MG tablet Take 1 tablet (100 mg total) by mouth 2 (two) times daily. 60 tablet 0   ticagrelor (BRILINTA) 90 MG TABS tablet Take 1 tablet (90 mg total) by mouth 2 (two) times daily. 60 tablet 0   traMADol (ULTRAM) 50 MG tablet Take 1 tablet (50 mg total) by mouth every 6 (six) hours as needed. 20 tablet 0   traZODone  (DESYREL) 50 MG tablet Take 50-100 mg by mouth at bedtime.     No current facility-administered medications for this visit.     Review of Systems    Persistent dyspnea on exertion.  Frequently awakes at night feeling tachypneic or gasping for air in the setting of noncompliance with CPAP.  Denies chest pain, palpitations, PND, orthopnea, dizziness, syncope, edema, or early satiety.  All other systems reviewed and are otherwise negative except as noted above.    Physical Exam    VS:  BP 114/70 (BP Location: Left Arm, Patient Position: Sitting, Cuff Size: Large)   Pulse 82   Ht 5' 8"  (1.727 m)   Wt (!) 330 lb 6.4 oz (149.9 kg)   SpO2 99%   BMI 50.24 kg/m  , BMI Body mass index is 50.24 kg/m.     GEN: Obese, in no acute distress. HEENT: normal. Neck: Supple, obese, difficult to gauge JVP.  No bruits or masses.   Cardiac: RRR, 2/6 systolic murmur loudest at the upper sternal borders, no rubs or gallops. No clubbing, cyanosis, edema.  Radials/PT 2+ and equal bilaterally.  Respiratory:  Respirations regular and unlabored, clear to auscultation bilaterally. GI: Obese, soft, nontender, nondistended, BS + x 4. MS: no deformity or atrophy. Skin: warm and dry, no rash. Neuro:  Strength and sensation are intact. Psych: Normal affect.  Accessory Clinical Findings    ECG personally reviewed by me today -regular sinus rhythm, 82- no acute changes.  Lab Results  Component Value Date   WBC 10.3 09/29/2021   HGB 14.0 09/29/2021   HCT 42.9 09/29/2021   MCV 84.6 09/29/2021   PLT 247 09/29/2021   Lab Results  Component Value Date   CREATININE 1.21 09/29/2021   BUN 24 (H) 09/29/2021   NA 137 09/29/2021   K 4.3 09/29/2021   CL 105 09/29/2021   CO2 21 (L) 09/29/2021   Lab Results  Component Value Date   ALT 32 12/08/2017   AST 22 12/08/2017   ALKPHOS 85 12/08/2017   BILITOT 0.2 12/08/2017   Lab Results  Component Value Date   CHOL 145 09/07/2021   HDL 43 09/07/2021   LDLCALC  79 09/07/2021   TRIG 113 09/07/2021   CHOLHDL 3.4 09/07/2021    Lab Results  Component Value Date   HGBA1C 7.5 (H) 09/07/2021    Assessment & Plan    1.  Coronary artery disease: Status post recent PCI and drug-eluting stent placement to the ostial  LAD with follow-up diagnostic catheterization in the setting of ongoing symptoms in early May revealing patent stent and otherwise nonobstructive disease.  He has been medically managed.  He has not been experiencing any chest pain but notes ongoing dyspnea on exertion.  He will start cardiac rehabilitation tomorrow and I think that this process will be very helpful to him.  We did discuss that if symptoms persist despite improved fitness, we could consider long-acting nitrate therapy versus ranolazine.  Otherwise plan to continue current doses of aspirin, statin, beta-blocker, and Brilinta therapy.  2.  Suspected hypertrophic cardiomyopathy: Noted to have significant LVOT gradient on recent diagnostic catheterization however, cardiac MRI did not show any evidence of LVOT gradient, scar, fibrosis, or hypertrophic cardiomyopathy.  Discussed with patient today.  Reassurance provided.  3.  Essential hypertension: Stable on beta-blocker therapy.  4.  Hyperlipidemia: LDL of 79 in April, and atorvastatin was titrated at that time.  Goal of less than 70.  His LP(a) was evaluated and not elevated.  5.  Morbid obesity: He will begin exercising at cardiac rehab tomorrow.  6.  Obstructive sleep apnea: Patient with prior history of sleep apnea but is not able to use CPAP because he sleeps on his side.  He is interested in discussing alternate modalities besides the mask and we will refer back to pulmonology, whom he he saw previously.  7.  Disposition: Follow-up in clinic in 3 months or sooner if necessary.   Murray Hodgkins, NP 10/22/2021, 3:44 PM

## 2021-10-23 DIAGNOSIS — Z955 Presence of coronary angioplasty implant and graft: Secondary | ICD-10-CM

## 2021-10-23 LAB — GLUCOSE, CAPILLARY
Glucose-Capillary: 160 mg/dL — ABNORMAL HIGH (ref 70–99)
Glucose-Capillary: 175 mg/dL — ABNORMAL HIGH (ref 70–99)

## 2021-10-23 NOTE — Progress Notes (Signed)
Daily Session Note  Patient Details  Name: Brett Marshall MRN: [id number] Date of Birth: 1966/05/07 Referring Provider:   Flowsheet Row Cardiac Rehab from 10/21/2021 in [facility] Cardiac and Pulmonary Rehab  Referring Provider [name] MD       Encounter Date: 10/23/2021  Check In:  Session Check In - 10/23/21 0742       Check-In   Supervising physician immediately available to respond to emergencies See telemetry face sheet for immediately available ER MD    Location [facility]-Cardiac & Pulmonary Rehab    Staff Present Coralie Keens, MS, ASCM CEP, Exercise Physiologist;Joseph Karie Fetch, MPA, RN    Virtual Visit No    Medication changes reported     No    Fall or balance concerns reported    No    Warm-up and Cool-down Performed on first and last piece of equipment    Resistance Training Performed Yes    VAD Patient? No    PAD/SET Patient? No      Pain Assessment   Currently in Pain? No/denies                Social History   Tobacco Use  Smoking Status Never  Smokeless Tobacco Never    Goals Met:  Independence with exercise equipment Exercise tolerated well No report of concerns or symptoms today Strength training completed today  Goals Unmet:  Not Applicable  Comments: First full day of exercise!  Patient was oriented to gym and equipment including functions, settings, policies, and procedures.  Patient's individual exercise prescription and treatment plan were reviewed.  All starting workloads were established based on the results of the 6 minute walk test done at initial orientation visit.  The plan for exercise progression was also introduced and progression will be customized based on patient's performance and goals.    Dr. Emily Filbert is Medical Director for [facility].  Dr. Ottie Glazier is Medical Director for [facility] Pulmonary Rehabilitation.

## 2021-10-27 ENCOUNTER — Encounter: Payer: BLUE CROSS/BLUE SHIELD | Admitting: *Deleted

## 2021-10-27 DIAGNOSIS — Z955 Presence of coronary angioplasty implant and graft: Secondary | ICD-10-CM

## 2021-10-27 LAB — GLUCOSE, CAPILLARY
Glucose-Capillary: 123 mg/dL — ABNORMAL HIGH (ref 70–99)
Glucose-Capillary: 151 mg/dL — ABNORMAL HIGH (ref 70–99)
Glucose-Capillary: 157 mg/dL — ABNORMAL HIGH (ref 70–99)

## 2021-10-27 NOTE — Progress Notes (Signed)
Daily Session Note  Patient Details  Name: LEOVANNI BJORKMAN MRN: [id number] Date of Birth: [birth year] Referring Provider:   Flowsheet Row Cardiac Rehab from 10/21/2021 in [facility] Cardiac and Pulmonary Rehab  Referring Provider [name] MD       Encounter Date: 10/27/2021  Check In:  Session Check In - 10/27/21 0814       Check-In   Supervising physician immediately available to respond to emergencies See telemetry face sheet for immediately available ER MD    Location [facility]-Cardiac & Pulmonary Rehab    Staff Present Earlean Shawl, BS, ACSM CEP, Exercise Physiologist;Jessica Buffalo, MA, RCEP, CCRP, CCET;Joseph [place], [place];Heath Lark, RN, BSN, CCRP    Virtual Visit No    Medication changes reported     No    Fall or balance concerns reported    No    Warm-up and Cool-down Performed on first and last piece of equipment    Resistance Training Performed Yes    VAD Patient? No    PAD/SET Patient? No      Pain Assessment   Currently in Pain? No/denies                Social History   Tobacco Use  Smoking Status Never  Smokeless Tobacco Never    Goals Met:  Independence with exercise equipment Exercise tolerated well No report of concerns or symptoms today  Goals Unmet:  Not Applicable  Comments: Pt able to follow exercise prescription today without complaint.  Will continue to monitor for progression.    Dr. Emily Filbert is Medical Director for [place].  Dr. Ottie Glazier is Medical Director for [facility] Pulmonary Rehabilitation.

## 2021-10-29 ENCOUNTER — Encounter: Payer: Self-pay | Admitting: *Deleted

## 2021-10-29 DIAGNOSIS — Z955 Presence of coronary angioplasty implant and graft: Secondary | ICD-10-CM

## 2021-10-29 NOTE — Progress Notes (Signed)
Cardiac Individual Treatment Plan  Patient Details  Name: Brett Marshall MRN: [id number] Date of Birth: [birth year] Referring Provider:   Flowsheet Row Cardiac Rehab from 10/21/2021 in [facility] Cardiac and Pulmonary Rehab  Referring Provider [name] MD       Initial Encounter Date:  Flowsheet Row Cardiac Rehab from 10/21/2021 in [facility] Cardiac and Pulmonary Rehab  Date 10/21/21       Visit Diagnosis: Status post coronary artery stent placement  Patient's Home Medications on Admission:  Current Outpatient Medications:    albuterol (VENTOLIN HFA) 108 (90 Base) MCG/ACT inhaler, Inhale 1-2 puffs into the lungs every 6 (six) hours as needed for wheezing or shortness of breath., Disp: 18 g, Rfl: 1   aspirin EC 81 MG EC tablet, Take 1 tablet (81 mg total) by mouth daily. Swallow whole., Disp: 30 tablet, Rfl: 2   atorvastatin (LIPITOR) 40 MG tablet, Take 1 tablet (40 mg total) by mouth daily at 6 PM., Disp: 30 tablet, Rfl: 2   cyclobenzaprine (FLEXERIL) 10 MG tablet, Take 1 tablet (10 mg total) by mouth 3 (three) times daily as needed.For spasms, Disp: 90 tablet, Rfl: 5   DULoxetine (CYMBALTA) 30 MG capsule, Take 2 capsules (60 mg total) by mouth daily., Disp: , Rfl:    famotidine (PEPCID) 40 MG tablet, Take 40 mg by mouth daily. , Disp: , Rfl:    furosemide (LASIX) 20 MG tablet, Take 1 tablet (20 mg total) by mouth daily as needed (for SOB or weight gain of 3 lbs overnight)., Disp: 90 tablet, Rfl: 3   glipiZIDE (GLUCOTROL) 5 MG tablet, Take 1 tablet (5 mg total) by mouth 2 (two) times daily before a meal., Disp: 180 tablet, Rfl: 1   glucose blood (GE100 BLOOD GLUCOSE TEST) test strip, Use as instructed, Disp: 100 each, Rfl: 12   JARDIANCE 25 MG TABS tablet, Take 25 mg by mouth daily., Disp: , Rfl:    Lancets (ONETOUCH ULTRASOFT) lancets, Check sugar twice daily, Disp: 100 each, Rfl: 12   metFORMIN (GLUCOPHAGE) 1000 MG tablet, Take 1 tablet (1,000 mg total) by mouth 2 (two) times daily with a  meal., Disp: 180 tablet, Rfl: 1   metoprolol tartrate (LOPRESSOR) 100 MG tablet, Take 1 tablet (100 mg total) by mouth 2 (two) times daily., Disp: 60 tablet, Rfl: 0   ticagrelor (BRILINTA) 90 MG TABS tablet, Take 1 tablet (90 mg total) by mouth 2 (two) times daily., Disp: 60 tablet, Rfl: 0   traMADol (ULTRAM) 50 MG tablet, Take 1 tablet (50 mg total) by mouth every 6 (six) hours as needed., Disp: 20 tablet, Rfl: 0   traZODone (DESYREL) 50 MG tablet, Take 50-100 mg by mouth at bedtime., Disp: , Rfl:   Past Medical History: Past Medical History:  Diagnosis Date   Asthma    CAD (coronary artery disease)    a. 08/2017 Cath: Diff nonobs dzs, EF 45-50%, diff HK; b. 08/2021 MV: Ant/apical ishemia; c. 08/2021 PCI: LAD 95ost (4.0x15 Onyx Frontier DES), EF 55-65%; c. 09/2021 Cath: LM nl, LAD patent stent, 57m 30d, RI min irregs, LCX min irregs, RCA 50p. LVOT grad 50-851mg (rest), 70-1108m (provoked).   DDD (degenerative disc disease), lumbar    Depression    Di[place] without complication [place]  Essential hypertension, benign    HFimpEF (heart failure with improved ejection fraction) ([facility]  a. 08/2017 LV gram: EF 45-50%, glob HK; b. 08/2021 Echo: EF 60-65%, no rwma, GrI DD, nl RV fxn; c.  09/2021 cMRI: EF 60%, no LVOT obstruction/gradient. Mild conc LVH, mild AS. No myocardial scar/fibrosis. No evidence of HCM.   Leg swelling    Left   Low back pain    Mixed ICM & NICM    a. 08/2017 LV gram: EF 45-50%, glob HK; b. 08/2021 Echo: EF 60-65%.   Morbid obesity ([place])    OSA on CPAP    non compliant   Pulmonary hypertension (HCC)     Tobacco Use: Social History   Tobacco Use  Smoking Status Never  Smokeless Tobacco Never    Labs: Review Flowsheet  More data exists      Latest Ref Rng & Units 02/15/2017 05/24/2017 08/23/2017 12/08/2017  Labs for ITP Cardiac and Pulmonary Rehab  Cholestrol 0 - 200 mg/dL - - - 176   LDL (calc) 0 - 99 mg/dL - - - 103   HDL-C >40 mg/dL - - - 44   Trlycerides  <150 mg/dL - - - 143   Hemoglobin A1c 4.8 - 5.6 % 8.1  8.1  C 6.6  6.9       09/07/2021  Labs for ITP Cardiac and Pulmonary Rehab  Cholestrol 145   LDL (calc) 79   HDL-C 43   Trlycerides 113   Hemoglobin A1c 7.5     Details      C Corrected result          Exercise Target Goals: Exercise Program Goal: Individual exercise prescription set using results from initial 6 min walk test and THRR while considering  patient's activity barriers and safety.   Exercise Prescription Goal: Initial exercise prescription builds to 30-45 minutes a day of aerobic activity, 2-3 days per week.  Home exercise guidelines will be given to patient during program as part of exercise prescription that the participant will acknowledge.   Education: Aerobic Exercise: - Group verbal and visual presentation on the components of exercise prescription. Introduces F.I.T.T principle from ACSM for exercise prescriptions.  Reviews F.I.T.T. principles of aerobic exercise including progression. Written material given at graduation.   Education: Resistance Exercise: - Group verbal and visual presentation on the components of exercise prescription. Introduces F.I.T.T principle from ACSM for exercise prescriptions  Reviews F.I.T.T. principles of resistance exercise including progression. Written material given at graduation.    Education: Exercise & Equipment Safety: - Individual verbal instruction and demonstration of equipment use and safety with use of the equipment. Flowsheet Row Cardiac Rehab from 10/21/2021 in [facility] Cardiac and Pulmonary Rehab  Date 10/21/21  Educator [facility]  Instruction Review Code 1- Verbalizes Understanding       Education: Exercise Physiology & General Exercise Guidelines: - Group verbal and written instruction with models to review the exercise physiology of the cardiovascular system and associated critical values. Provides general exercise guidelines with specific guidelines to those  with heart or lung disease.    Education: Flexibility, Balance, Mind/Body Relaxation: - Group verbal and visual presentation with interactive activity on the components of exercise prescription. Introduces F.I.T.T principle from ACSM for exercise prescriptions. Reviews F.I.T.T. principles of flexibility and balance exercise training including progression. Also discusses the mind body connection.  Reviews various relaxation techniques to help reduce and manage stress (i.e. Deep breathing, progressive muscle relaxation, and visualization). Balance handout provided to take home. Written material given at graduation.   Activity Barriers & Risk Stratification:  Activity Barriers & Cardiac Risk Stratification - 10/21/21 1028       Activity Barriers & Cardiac Risk Stratification   Activity Barriers  Deconditioning;Muscular Weakness;Shortness of Breath;Balance Concerns;Neck/Spine Problems;Joint Problems   DDD in neck and spine, L shoulder limited   Cardiac Risk Stratification Moderate             6 Minute Walk:  6 Minute Walk     Row Name 10/21/21 1027         6 Minute Walk   Phase Initial     Distance 1060 feet     Walk Time 6 minutes     # of Rest Breaks 0     MPH 2     METS 2.16     RPE 14     Perceived Dyspnea  3     VO2 Peak 7.56     Symptoms Yes (comment)     Comments chest tightness 6/10, SOB     Resting HR 70 bpm     Resting BP 124/64     Resting Oxygen Saturation  98 %     Exercise Oxygen Saturation  during 6 min walk 100 %     Max Ex. HR 109 bpm     Max Ex. BP 128/64     2 Minute Post BP 114/66              Oxygen Initial Assessment:   Oxygen Re-Evaluation:   Oxygen Discharge (Final Oxygen Re-Evaluation):   Initial Exercise Prescription:  Initial Exercise Prescription - 10/21/21 1000       Date of Initial Exercise RX and Referring Provider   Date 10/21/21    Referring Provider [name] MD      Oxygen   Maintain Oxygen Saturation 88% or  higher      Treadmill   MPH 1.8    Grade 0.5    Minutes 15    METs 2.52      REL-XR   Level 1    Speed 50    Minutes 15    METs 2.5      T5 Nustep   Level 2    SPM 80    Minutes 15    METs 2.5      Biostep-RELP   Level 2    SPM 50    Minutes 15    METs 2      Track   Laps 28    Minutes 15    METs 2.52      Prescription Details   Frequency (times per week) 2    Duration Progress to 30 minutes of continuous aerobic without signs/symptoms of physical distress      Intensity   THRR 40-80% of Max Heartrate 108-145    Ratings of Perceived Exertion 11-13    Perceived Dyspnea 0-4      Progression   Progression Continue to progress workloads to maintain intensity without signs/symptoms of physical distress.      Resistance Training   Training Prescription Yes    Weight 4 lb    Reps 10-15             Perform Capillary Blood Glucose checks as needed.  Exercise Prescription Changes:   Exercise Prescription Changes     Row Name 10/21/21 1000             Response to Exercise   Blood Pressure (Admit) 124/64       Blood Pressure (Exercise) 128/64       Blood Pressure (Exit) 114/66       Heart Rate (Admit) 70 bpm  Heart Rate (Exercise) 109 bpm       Heart Rate (Exit) 80 bpm       Oxygen Saturation (Admit) 98 %       Oxygen Saturation (Exercise) 100 %       Rating of Perceived Exertion (Exercise) 14       Perceived Dyspnea (Exercise) 3       Symptoms SOB, chest tightness 6/10       Comments walk test results                Exercise Comments:   Exercise Comments     Row Name 10/23/21 0800           Exercise Comments First full day of exercise!  Patient was oriented to gym and equipment including functions, settings, policies, and procedures.  Patient's individual exercise prescription and treatment plan were reviewed.  All starting workloads were established based on the results of the 6 minute walk test done at initial orientation  visit.  The plan for exercise progression was also introduced and progression will be customized based on patient's performance and goals.                Exercise Goals and Review:   Exercise Goals     Row Name 10/21/21 1031             Exercise Goals   Increase Physical Activity Yes       Intervention Provide advice, education, support and counseling about physical activity/exercise needs.;Develop an individualized exercise prescription for aerobic and resistive training based on initial evaluation findings, risk stratification, comorbidities and participant's personal goals.       Expected Outcomes Long Term: Add in home exercise to make exercise part of routine and to increase amount of physical activity.;Short Term: Attend rehab on a regular basis to increase amount of physical activity.;Long Term: Exercising regularly at least 3-5 days a week.       Increase Strength and Stamina Yes       Intervention Provide advice, education, support and counseling about physical activity/exercise needs.;Develop an individualized exercise prescription for aerobic and resistive training based on initial evaluation findings, risk stratification, comorbidities and participant's personal goals.       Expected Outcomes Short Term: Increase workloads from initial exercise prescription for resistance, speed, and METs.;Short Term: Perform resistance training exercises routinely during rehab and add in resistance training at home;Long Term: Improve cardiorespiratory fitness, muscular endurance and strength as measured by increased METs and functional capacity (6MWT)       Able to understand and use rate of perceived exertion (RPE) scale Yes       Intervention Provide education and explanation on how to use RPE scale       Expected Outcomes Short Term: Able to use RPE daily in rehab to express subjective intensity level;Long Term:  Able to use RPE to guide intensity level when exercising independently        Able to understand and use Dyspnea scale Yes       Intervention Provide education and explanation on how to use Dyspnea scale       Expected Outcomes Short Term: Able to use Dyspnea scale daily in rehab to express subjective sense of shortness of breath during exertion;Long Term: Able to use Dyspnea scale to guide intensity level when exercising independently       Knowledge and understanding of Target Heart Rate Range (THRR) Yes       Intervention Provide  education and explanation of THRR including how the numbers were predicted and where they are located for reference       Expected Outcomes Short Term: Able to state/look up THRR;Short Term: Able to use daily as guideline for intensity in rehab;Long Term: Able to use THRR to govern intensity when exercising independently       Able to check pulse independently Yes       Intervention Provide education and demonstration on how to check pulse in carotid and radial arteries.;Review the importance of being able to check your own pulse for safety during independent exercise       Expected Outcomes Short Term: Able to explain why pulse checking is important during independent exercise;Long Term: Able to check pulse independently and accurately       Understanding of Exercise Prescription Yes       Intervention Provide education, explanation, and written materials on patient's individual exercise prescription       Expected Outcomes Short Term: Able to explain program exercise prescription;Long Term: Able to explain home exercise prescription to exercise independently                Exercise Goals Re-Evaluation :  Exercise Goals Re-Evaluation     Row Name 10/23/21 0800             Exercise Goal Re-Evaluation   Exercise Goals Review Able to understand and use rate of perceived exertion (RPE) scale;Knowledge and understanding of Target Heart Rate Range (THRR);Understanding of Exercise Prescription;Increase Physical Activity;Increase Strength and  Stamina;Able to understand and use Dyspnea scale;Able to check pulse independently       Comments Reviewed RPE and dyspnea scales, THR and program prescription with pt today.  Pt voiced understanding and was given a copy of goals to take home.       Expected Outcomes Short: Use RPE daily to regulate intensity. Long: Follow program prescription in THR.                Discharge Exercise Prescription (Final Exercise Prescription Changes):  Exercise Prescription Changes - 10/21/21 1000       Response to Exercise   Blood Pressure (Admit) 124/64    Blood Pressure (Exercise) 128/64    Blood Pressure (Exit) 114/66    Heart Rate (Admit) 70 bpm    Heart Rate (Exercise) 109 bpm    Heart Rate (Exit) 80 bpm    Oxygen Saturation (Admit) 98 %    Oxygen Saturation (Exercise) 100 %    Rating of Perceived Exertion (Exercise) 14    Perceived Dyspnea (Exercise) 3    Symptoms SOB, chest tightness 6/10    Comments walk test results             Nutrition:  Target Goals: Understanding of nutrition guidelines, daily intake of sodium <1513m, cholesterol <2070m calories 30% from fat and 7% or less from saturated fats, daily to have 5 or more servings of fruits and vegetables.  Education: All About Nutrition: -Group instruction provided by verbal, written material, interactive activities, discussions, models, and posters to present general guidelines for heart healthy nutrition including fat, fiber, MyPlate, the role of sodium in heart healthy nutrition, utilization of the nutrition label, and utilization of this knowledge for meal planning. Follow up email sent as well. Written material given at graduation. [facility]  Education need identified 10/21/21       Biometrics:  Pre Biometrics - 10/21/21 1031  Pre Biometrics   Height 5' 9.5" (1.765 m)    Weight 329 lb 8 oz (149.5 kg)    BMI (Calculated) 47.98    Single Leg  Stand 3.5 seconds              Nutrition Therapy Plan and Nutrition Goals:  Nutrition Therapy & Goals - 10/21/21 1032       Intervention Plan   Intervention Prescribe, educate and counsel regarding individualized specific dietary modifications aiming towards targeted core components such as weight, hypertension, lipid management, diabetes, heart failure and other comorbidities.    Expected Outcomes Long Term Goal: Adherence to prescribed nutrition plan.;Short Term Goal: A plan has been developed with personal nutrition goals set during dietitian appointment.;Short Term Goal: Understand basic principles of dietary content, such as calories, fat, sodium, cholesterol and nutrients.             Nutrition Assessments:  MEDIFICTS Score Key: ?70 Need to make dietary changes  40-70 Heart Healthy Diet ? 40 Therapeutic Level Cholesterol Diet  Flowsheet Row Cardiac Rehab from 10/21/2021 in [facility] Cardiac and Pulmonary Rehab  Picture Your Plate Total Score on Admission 53      Picture Your Plate Scores: <16 Unhealthy dietary pattern with much room for improvement. 41-50 Dietary pattern unlikely to meet recommendations for good health and room for improvement. 51-60 More healthful dietary pattern, with some room for improvement.  >60 Healthy dietary pattern, although there may be some specific behaviors that could be improved.    Nutrition Goals Re-Evaluation:   Nutrition Goals Discharge (Final Nutrition Goals Re-Evaluation):   Psychosocial: Target Goals: Acknowledge presence or absence of significant depression and/or stress, maximize coping skills, provide positive support system. Participant is able to verbalize types and ability to use techniques and skills needed for reducing stress and depression.   Education: Stress, Anxiety, and Depression - Group verbal and visual presentation to define topics covered.  Reviews how body is impacted by stress, anxiety, and depression.  Also  discusses healthy ways to reduce stress and to treat/manage anxiety and depression.  Written material given at graduation.   Education: Sleep Hygiene -Provides group verbal and written instruction about how sleep can affect your health.  Define sleep hygiene, discuss sleep cycles and impact of sleep habits. Review good sleep hygiene tips.    Initial Review & Psychosocial Screening:  Initial Psych Review & Screening - 10/08/21 1343       Initial Review   Current issues with None Identified      Family Dynamics   Good Support System? Yes   Wife,friends, church friends     Barriers   Psychosocial barriers to participate in program There are no identifiable barriers or psychosocial needs.      Screening Interventions   Interventions Encouraged to exercise;To provide support and resources with identified psychosocial needs;Provide feedback about the scores to participant    Expected Outcomes Short Term goal: Utilizing psychosocial counselor, staff and physician to assist with identification of specific Stressors or current issues interfering with healing process. Setting desired goal for each stressor or current issue identified.;Long Term Goal: Stressors or current issues are controlled or eliminated.;Short Term goal: Identification and review with participant of any Quality of Life or Depression concerns found by scoring the questionnaire.;Long Term goal: The participant improves quality of Life and PHQ9 Scores as seen by post scores and/or verbalization of changes             Quality of Life Scores:  Quality of Life - 10/21/21 1032       Quality of Life   Select Quality of Life      Quality of Life Scores   Health/Function Pre 12.2 %    Socioeconomic Pre 22.93 %    Psych/Spiritual Pre 16.29 %    Family Pre 22.5 %    GLOBAL Pre 16.59 %            Scores of 19 and below usually indicate a poorer quality of life in these areas.  A difference of  2-3 points is a clinically  meaningful difference.  A difference of 2-3 points in the total score of the Quality of Life Index has been associated with significant improvement in overall quality of life, self-image, physical symptoms, and general health in studies assessing change in quality of life.  PHQ-9: Review Flowsheet  More data exists      10/21/2021 12/08/2017 08/23/2017 05/24/2017 02/15/2017  Depression screen PHQ 2/9  Decreased Interest 2 0 0 0 0  Down, Depressed, Hopeless 0 0 0 0 0  PHQ - 2 Score 2 0 0 0 0  Altered sleeping 2 - - - -  Tired, decreased energy 3 - - - -  Change in appetite 2 - - - -  Feeling bad or failure about yourself  0 - - - -  Trouble concentrating 0 - - - -  Moving slowly or fidgety/restless 1 - - - -  Suicidal thoughts 0 - - - -  PHQ-9 Score 10 - - - -  Difficult doing work/chores Somewhat difficult - - - -   Interpretation of Total Score  Total Score Depression Severity:  1-4 = Minimal depression, 5-9 = Mild depression, 10-14 = Moderate depression, 15-19 = Moderately severe depression, 20-27 = Severe depression   Psychosocial Evaluation and Intervention:  Psychosocial Evaluation - 10/08/21 1401       Psychosocial Evaluation & Interventions   Interventions Encouraged to exercise with the program and follow exercise prescription    Comments Cline has no barriers to attending the program. He lives with his wife,. His wife, friends and church friends are his support team. Babyboy is working on weight loss. He has lost over 100 lbs and is continuing the weight loss journey. He wants to learn how to manage an exercise program and to continue with his weight loss. HIs long term goal is to reduce the anmount of medicine he is taking.    Expected Outcomes STG Travus is able to attend all scheduled sessions, he works with EP and RD to continue his weight loss journey, and he manages to progress to a routine exercise program LTG Lovell continues to progress with exercise and weight loss after  discharge. He is able to in the longterm to decrease his medication needs    Continue Psychosocial Services  Follow up required by staff             Psychosocial Re-Evaluation:   Psychosocial Discharge (Final Psychosocial Re-Evaluation):   Vocational Rehabilitation: Provide vocational rehab assistance to qualifying candidates.   Vocational Rehab Evaluation & Intervention:   Education: Education Goals: Education classes will be provided on a variety of topics geared toward better understanding of heart health and risk factor modification. Participant will state understanding/return demonstration of topics presented as noted by education test scores.  Learning Barriers/Preferences:   General Cardiac Education Topics:  AED/CPR: - Group verbal and written instruction with the use of models to demonstrate the basic  use of the AED with the basic ABC's of resuscitation.   Anatomy and Cardiac Procedures: - Group verbal and visual presentation and models provide information about basic cardiac anatomy and function. Reviews the testing methods done to diagnose heart disease and the outcomes of the test results. Describes the treatment choices: Medical Management, Angioplasty, or Coronary Bypass Surgery for treating various heart conditions including Myocardial Infarction, Angina, Valve Disease, and Cardiac Arrhythmias.  Written material given at graduation. Flowsheet Row Cardiac Rehab from 10/21/2021 in [facility] Cardiac and Pulmonary Rehab  Education need identified 10/21/21       Medication Safety: - Group verbal and visual instruction to review commonly prescribed medications for heart and lung disease. Reviews the medication, class of the drug, and side effects. Includes the steps to properly store meds and maintain the prescription regimen.  Written material given at graduation.   Intimacy: - Group verbal instruction through game format to discuss how heart and lung disease can  affect sexual intimacy. Written material given at graduation..   Know Your Numbers and Heart Failure: - Group verbal and visual instruction to discuss disease risk factors for cardiac and pulmonary disease and treatment options.  Reviews associated critical values for Overweight/Obesity, Hypertension, Cholesterol, and Diabetes.  Discusses basics of heart failure: signs/symptoms and treatments.  Introduces Heart Failure Zone chart for action plan for heart failure.  Written material given at graduation.   Infection Prevention: - Provides verbal and written material to individual with discussion of infection control including proper hand washing and proper equipment cleaning during exercise session. Flowsheet Row Cardiac Rehab from 10/21/2021 in [facility] Cardiac and Pulmonary Rehab  Date 10/21/21  Educator [facility]  Instruction Review Code 1- Verbalizes Understanding       Falls Prevention: - Provides verbal and written material to individual with discussion of falls prevention and safety. Flowsheet Row Cardiac Rehab from 10/21/2021 in [facility] Cardiac and Pulmonary Rehab  Date 10/08/21  Educator SB  Instruction Review Code 1- Verbalizes Understanding       Other: -Provides group and verbal instruction on various topics (see comments)   Knowledge Questionnaire Score:  Knowledge Questionnaire Score - 10/21/21 1033       Knowledge Questionnaire Score   Pre Score 24/26             Core Components/Risk Factors/Patient Goals at Admission:  Personal Goals and Risk Factors at Admission - 10/21/21 1033       Core Components/Risk Factors/Patient Goals on Admission    Weight Management Yes;Obesity;Weight Loss    Intervention Weight Management: Develop a combined nutrition and exercise program designed to reach desired caloric intake, while maintaining appropriate intake of nutrient and fiber, sodium and fats, and appropriate energy expenditure required for the weight goal.;Weight  Management/Obesity: Establish reasonable short term and long term weight goals.;Obesity: Provide education and appropriate resources to help participant work on and attain dietary goals.;Weight Management: Provide education and appropriate resources to help participant work on and attain dietary goals.    Admit Weight 329 lb 8 oz (149.5 kg)    Goal Weight: Short Term 324 lb (147 kg)    Goal Weight: Long Term 225 lb (102.1 kg)    Expected Outcomes Short Term: Continue to assess and modify interventions until short term weight is achieved;Long Term: Adherence to nutrition and physical activity/exercise program aimed toward attainment of established weight goal;Weight Loss: Understanding of general recommendations for a balanced deficit meal plan, which promotes 1-2 lb weight loss per week and includes a  negative energy balance of [phone] kcal/d;Understanding recommendations for meals to include 15-35% energy as protein, 25-35% energy from fat, 35-60% energy from carbohydrates, less than 224m of dietary cholesterol, 20-35 gm of total fiber daily;Understanding of distribution of calorie intake throughout the day with the consumption of 4-5 meals/snacks    Diabetes Yes    Intervention Provide education about signs/symptoms and action to take for hypo/hyperglycemia.;Provide education about proper nutrition, including hydration, and aerobic/resistive exercise prescription along with prescribed medications to achieve blood glucose in normal ranges: Fasting glucose 65-99 mg/dL    Expected Outcomes Short Term: Participant verbalizes understanding of the signs/symptoms and immediate care of hyper/hypoglycemia, proper foot care and importance of medication, aerobic/resistive exercise and nutrition plan for blood glucose control.;Long Term: Attainment of HbA1C < 7%.    Heart Failure Yes    Intervention Provide a combined exercise and nutrition program that is supplemented with education, support and counseling about  heart failure. Directed toward relieving symptoms such as shortness of breath, decreased exercise tolerance, and extremity edema.    Expected Outcomes Short term: Attendance in program 2-3 days a week with increased exercise capacity. Reported lower sodium intake. Reported increased fruit and vegetable intake. Reports medication compliance.;Improve functional capacity of life;Short term: Daily weights obtained and reported for increase. Utilizing diuretic protocols set by physician.;Long term: Adoption of self-care skills and reduction of barriers for early signs and symptoms recognition and intervention leading to self-care maintenance.    Hypertension Yes    Intervention Provide education on lifestyle modifcations including regular physical activity/exercise, weight management, moderate sodium restriction and increased consumption of fresh fruit, vegetables, and low fat dairy, alcohol moderation, and smoking cessation.;Monitor prescription use compliance.    Expected Outcomes Short Term: Continued assessment and intervention until BP is < 140/998mHG in hypertensive participants. < 130/8061mG in hypertensive participants with diabetes, heart failure or chronic kidney disease.;Long Term: Maintenance of blood pressure at goal levels.    Lipids Yes    Intervention Provide education and support for participant on nutrition & aerobic/resistive exercise along with prescribed medications to achieve LDL <24m37mDL >40mg48m Expected Outcomes Short Term: Participant states understanding of desired cholesterol values and is compliant with medications prescribed. Participant is following exercise prescription and nutrition guidelines.;Long Term: Cholesterol controlled with medications as prescribed, with individualized exercise RX and with personalized nutrition plan. Value goals: LDL < 24mg,79m > 40 mg.             Education:Diabetes - Individual verbal and written instruction to review signs/symptoms of  diabetes, desired ranges of glucose level fasting, after meals and with exercise. Acknowledge that pre and post exercise glucose checks will be done for 3 sessions at entry of program. [facility]  Date 10/21/21  Educator JH  [facility] Code 1- Verbalizes Understanding       Core Components/Risk Factors/Patient Goals Review:    Core Components/Risk Factors/Patient Goals at Discharge (Final Review):    ITP Comments:  ITP Comments     Row Name 10/08/21 1401 10/21/21 1027 10/23/21 0800 10/29/21 1258     ITP Comments Virtual orientation call completed today. he has an appointment on Date: 10/21/2021  for EP eval and gym Orientation.  [facility] . Completed 6MWT and gym orientation. Initial ITP created and sent for review to Dr. Mark MEmily Filbertcal Director. First full day of exercise!  Patient was  oriented to gym and equipment including functions, settings, policies, and procedures.  Patient's individual exercise prescription and treatment plan were reviewed.  All starting workloads were established based on the results of the 6 minute walk test done at initial orientation visit.  The plan for exercise progression was also introduced and progression will be customized based on patient's performance and goals. 30 Day review completed. Medical Director ITP review done, changes made as directed, and signed approval by Medical Director.             Comments:

## 2021-10-30 ENCOUNTER — Other Ambulatory Visit: Payer: Self-pay | Admitting: Cardiovascular Disease

## 2021-10-30 DIAGNOSIS — Z955 Presence of coronary angioplasty implant and graft: Secondary | ICD-10-CM

## 2021-10-30 LAB — GLUCOSE, CAPILLARY
Glucose-Capillary: 113 mg/dL — ABNORMAL HIGH (ref 70–99)
Glucose-Capillary: 148 mg/dL — ABNORMAL HIGH (ref 70–99)

## 2021-10-30 NOTE — Progress Notes (Signed)
Daily Session Note  Patient Details  Name: Brett Marshall MRN: [id number] Date of Birth: 09-Jan-1966 Referring Provider:   Flowsheet Row Cardiac Rehab from 10/21/2021 in [facility] Cardiac and Pulmonary Rehab  Referring Provider [name] MD       Encounter Date: 10/30/2021  Check In:  Session Check In - 10/30/21 0700       Check-In   Supervising physician immediately available to respond to emergencies See telemetry face sheet for immediately available ER MD    Location [facility]-Cardiac & Pulmonary Rehab    Staff Present Vida Rigger, RN, BSN    Virtual Visit No    Medication changes reported     No    Fall or balance concerns reported    No    Warm-up and Cool-down Performed on first and last piece of equipment    Resistance Training Performed Yes    VAD Patient? No    PAD/SET Patient? No      Pain Assessment   Currently in Pain? No/denies                Social History   Tobacco Use  Smoking Status Never  Smokeless Tobacco Never    Goals Met:  Independence with exercise equipment Exercise tolerated well No report of concerns or symptoms today Strength training completed today  Goals Unmet:  Not Applicable  Comments: Pt able to follow exercise prescription today without complaint.  Will continue to monitor for progression.   Dr. Emily Filbert is Medical Director for [place].  Dr. Ottie Glazier is Medical Director for [facility] Pulmonary Rehabilitation.

## 2021-11-06 DIAGNOSIS — Z955 Presence of coronary angioplasty implant and graft: Secondary | ICD-10-CM | POA: Diagnosis not present

## 2021-11-06 NOTE — Progress Notes (Signed)
Daily Session Note  Patient Details  Name: Brett Marshall MRN: [id number] Date of Birth: 1965-07-22 Referring Provider:   Flowsheet Row Cardiac Rehab from 10/21/2021 in [facility] Cardiac and Pulmonary Rehab  Referring Provider [name] MD       Encounter Date: 11/06/2021  Check In:  Session Check In - 11/06/21 0715       Check-In   Supervising physician immediately available to respond to emergencies See telemetry face sheet for immediately available ER MD    Location [facility]-Cardiac & Pulmonary Rehab    Staff Present Birdie Sons, MPA, RN;Laureen Owens Shark, BS, RRT, CPFT;Joseph [place], [place]    Virtual Visit No    Medication changes reported     No    Fall or balance concerns reported    No    Warm-up and Cool-down Performed on first and last piece of equipment    Resistance Training Performed Yes    VAD Patient? No    PAD/SET Patient? No      Pain Assessment   Currently in Pain? No/denies                Social History   Tobacco Use  Smoking Status Never  Smokeless Tobacco Never    Goals Met:  Independence with exercise equipment Exercise tolerated well No report of concerns or symptoms today Strength training completed today  Goals Unmet:  Not Applicable  Comments: Pt able to follow exercise prescription today without complaint.  Will continue to monitor for progression.    Dr. Emily Filbert is Medical Director for [place].  Dr. Ottie Glazier is Medical Director for [facility] Pulmonary Rehabilitation.

## 2021-11-10 ENCOUNTER — Encounter: Payer: BLUE CROSS/BLUE SHIELD | Admitting: *Deleted

## 2021-11-11 DIAGNOSIS — Z955 Presence of coronary angioplasty implant and graft: Secondary | ICD-10-CM

## 2021-11-13 DIAGNOSIS — Z955 Presence of coronary angioplasty implant and graft: Secondary | ICD-10-CM | POA: Diagnosis not present

## 2021-11-13 NOTE — Progress Notes (Signed)
Daily Session Note  Patient Details  Name: Brett Marshall MRN: [id number] Date of Birth: 16-Dec-1965 Referring Provider:   Flowsheet Row Cardiac Rehab from 10/21/2021 in [facility] Cardiac and Pulmonary Rehab  Referring Provider [name] MD       Encounter Date: 11/13/2021  Check In:  Session Check In - 11/13/21 0717       Check-In   Supervising physician immediately available to respond to emergencies See telemetry face sheet for immediately available ER MD    Location [facility]-Cardiac & Pulmonary Rehab    Staff Present Alberteen Sam, MA, RCEP, CCRP, CCET;Shantele Reller [place], MPA, RN;Joseph [place], RCP,RRT,BSRT;Melissa Smithers, [place], LDN    Virtual Visit No    Medication changes reported     No    Fall or balance concerns reported    No    Warm-up and Cool-down Performed on first and last piece of equipment    Resistance Training Performed Yes    VAD Patient? No    PAD/SET Patient? No      Pain Assessment   Currently in Pain? No/denies                Social History   Tobacco Use  Smoking Status Never  Smokeless Tobacco Never    Goals Met:  Independence with exercise equipment Exercise tolerated well No report of concerns or symptoms today Strength training completed today  Goals Unmet:  Not Applicable  Comments: Pt able to follow exercise prescription today without complaint.  Will continue to monitor for progression.  Reviewed home exercise with pt today.  Pt plans to walk and use staff videos at home for exercise.  Reviewed THR, pulse, RPE, sign and symptoms, pulse oximetery and when to call 911 or MD.  Also discussed weather considerations and indoor options.  Pt voiced understanding.   Dr. Emily Filbert is Medical Director for [place].  Dr. Ottie Glazier is Medical Director for [facility] Pulmonary Rehabilitation.

## 2021-11-17 ENCOUNTER — Encounter: Payer: BLUE CROSS/BLUE SHIELD | Attending: Cardiovascular Disease | Admitting: *Deleted

## 2021-11-17 DIAGNOSIS — Z48812 Encounter for surgical aftercare following surgery on the circulatory system: Secondary | ICD-10-CM | POA: Insufficient documentation

## 2021-11-17 DIAGNOSIS — Z955 Presence of coronary angioplasty implant and graft: Secondary | ICD-10-CM | POA: Insufficient documentation

## 2021-11-17 NOTE — Progress Notes (Signed)
Daily Session Note  Patient Details  Name: Brett Marshall MRN: [id number] Date of Birth: 11-27-1965 Referring Provider:   Flowsheet Row Cardiac Rehab from 10/21/2021 in [facility] Cardiac and Pulmonary Rehab  Referring Provider [name] MD       Encounter Date: 11/17/2021  Check In:  Session Check In - 11/17/21 0807       Check-In   Supervising physician immediately available to respond to emergencies See telemetry face sheet for immediately available ER MD    Location [facility]-Cardiac & Pulmonary Rehab    Staff Present Heath Lark, RN, BSN, CCRP;Laureen Owens Shark, BS, RRT, CPFT;Joseph [place], [place]    Virtual Visit No    Medication changes reported     No    Fall or balance concerns reported    No    Warm-up and Cool-down Performed on first and last piece of equipment    Resistance Training Performed Yes    VAD Patient? No    PAD/SET Patient? No      Pain Assessment   Currently in Pain? No/denies                Social History   Tobacco Use  Smoking Status Never  Smokeless Tobacco Never    Goals Met:  Independence with exercise equipment Exercise tolerated well No report of concerns or symptoms today  Goals Unmet:  Not Applicable  Comments: Pt able to follow exercise prescription today without complaint.  Will continue to monitor for progression.    Dr. Emily Filbert is Medical Director for [place].  Dr. Ottie Glazier is Medical Director for [facility] Pulmonary Rehabilitation.

## 2021-11-24 ENCOUNTER — Encounter: Payer: BLUE CROSS/BLUE SHIELD | Admitting: *Deleted

## 2021-11-24 DIAGNOSIS — Z955 Presence of coronary angioplasty implant and graft: Secondary | ICD-10-CM

## 2021-11-24 DIAGNOSIS — Z48812 Encounter for surgical aftercare following surgery on the circulatory system: Secondary | ICD-10-CM | POA: Diagnosis not present

## 2021-11-24 NOTE — Progress Notes (Signed)
Daily Session Note  Patient Details  Name: Brett Marshall MRN: [id number] Date of Birth: 30-Aug-1965 Referring Provider:   Flowsheet Row Cardiac Rehab from 10/21/2021 in [facility] Cardiac and Pulmonary Rehab  Referring Provider [name] MD       Encounter Date: 11/24/2021  Check In:  Session Check In - 11/24/21 0819       Check-In   Supervising physician immediately available to respond to emergencies See telemetry face sheet for immediately available ER MD    Location [facility]-Cardiac & Pulmonary Rehab    Staff Present Heath Lark, RN, BSN, [place], BS, ACSM CEP, Exercise Physiologist;Joseph [place], [place]    Virtual Visit No    Medication changes reported     No    Fall or balance concerns reported    No    Warm-up and Cool-down Performed on first and last piece of equipment    Resistance Training Performed Yes    VAD Patient? No    PAD/SET Patient? No      Pain Assessment   Currently in Pain? No/denies                Social History   Tobacco Use  Smoking Status Never  Smokeless Tobacco Never    Goals Met:  Independence with exercise equipment Exercise tolerated well No report of concerns or symptoms today  Goals Unmet:  Not Applicable  Comments: Pt able to follow exercise prescription today without complaint.  Will continue to monitor for progression.    Dr. Emily Filbert is Medical Director for [place].  Dr. Ottie Glazier is Medical Director for [facility] Pulmonary Rehabilitation.

## 2021-11-26 ENCOUNTER — Encounter: Payer: Self-pay | Admitting: *Deleted

## 2021-11-26 DIAGNOSIS — Z955 Presence of coronary angioplasty implant and graft: Secondary | ICD-10-CM

## 2021-11-26 NOTE — Progress Notes (Signed)
Cardiac Individual Treatment Plan  Patient Details  Name: Brett Marshall MRN: [id number] Date of Birth: 28-Jul-1965 Referring Provider:   Flowsheet Row Cardiac Rehab from 10/21/2021 in [facility] Cardiac and Pulmonary Rehab  Referring Provider [name] MD       Initial Encounter Date:  Flowsheet Row Cardiac Rehab from 10/21/2021 in [facility] Cardiac and Pulmonary Rehab  Date 10/21/21       Visit Diagnosis: Status post coronary artery stent placement  Patient's Home Medications on Admission:  Current Outpatient Medications:    albuterol (VENTOLIN HFA) 108 (90 Base) MCG/ACT inhaler, Inhale 1-2 puffs into the lungs every 6 (six) hours as needed for wheezing or shortness of breath., Disp: 18 g, Rfl: 1   aspirin EC 81 MG EC tablet, Take 1 tablet (81 mg total) by mouth daily. Swallow whole., Disp: 30 tablet, Rfl: 2   atorvastatin (LIPITOR) 40 MG tablet, Take 1 tablet (40 mg total) by mouth daily at 6 PM., Disp: 30 tablet, Rfl: 2   BRILINTA 90 MG TABS tablet, Take 1 tablet (90 mg total) by mouth 2 (two) times daily., Disp: 60 tablet, Rfl: 3   cyclobenzaprine (FLEXERIL) 10 MG tablet, Take 1 tablet (10 mg total) by mouth 3 (three) times daily as needed.For spasms, Disp: 90 tablet, Rfl: 5   DULoxetine (CYMBALTA) 30 MG capsule, Take 2 capsules (60 mg total) by mouth daily., Disp: , Rfl:    famotidine (PEPCID) 40 MG tablet, Take 40 mg by mouth daily. , Disp: , Rfl:    furosemide (LASIX) 20 MG tablet, Take 1 tablet (20 mg total) by mouth daily as needed (for SOB or weight gain of 3 lbs overnight)., Disp: 90 tablet, Rfl: 3   glipiZIDE (GLUCOTROL) 5 MG tablet, Take 1 tablet (5 mg total) by mouth 2 (two) times daily before a meal., Disp: 180 tablet, Rfl: 1   glucose blood (GE100 BLOOD GLUCOSE TEST) test strip, Use as instructed, Disp: 100 each, Rfl: 12   JARDIANCE 25 MG TABS tablet, Take 25 mg by mouth daily., Disp: , Rfl:    Lancets (ONETOUCH ULTRASOFT) lancets, Check sugar twice daily, Disp: 100 each,  Rfl: 12   metFORMIN (GLUCOPHAGE) 1000 MG tablet, Take 1 tablet (1,000 mg total) by mouth 2 (two) times daily with a meal., Disp: 180 tablet, Rfl: 1   metoprolol tartrate (LOPRESSOR) 100 MG tablet, Take 1 tablet (100 mg total) by mouth 2 (two) times daily., Disp: 60 tablet, Rfl: 3   traMADol (ULTRAM) 50 MG tablet, Take 1 tablet (50 mg total) by mouth every 6 (six) hours as needed., Disp: 20 tablet, Rfl: 0   traZODone (DESYREL) 50 MG tablet, Take 50-100 mg by mouth at bedtime., Disp: , Rfl:   Past Medical History: Past Medical History:  Diagnosis Date   Asthma    CAD (coronary artery disease)    a. 08/2017 Cath: Diff nonobs dzs, EF 45-50%, diff HK; b. 08/2021 MV: Ant/apical ishemia; c. 08/2021 PCI: LAD 95ost (4.0x15 Onyx Frontier DES), EF 55-65%; c. 09/2021 Cath: LM nl, LAD patent stent, 19m 30d, RI min irregs, LCX min irregs, RCA 50p. LVOT grad 50-890mg (rest), 70-11063m (provoked).   DDD (degenerative disc disease), lumbar    Depression    Diabetes mellitus without complication ([place]  Essential hypertension, benign    HFimpEF (heart failure with improved ejection fraction) ([place]  a. 08/2017 LV gram: EF 45-50%, glob HK; b. 08/2021 Echo: EF 60-65%, no rwma, GrI DD, nl RV fxn; c. 09/2021  cMRI: EF 60%, no LVOT obstruction/gradient. Mild conc LVH, mild AS. No myocardial scar/fibrosis. No evidence of HCM.   Leg swelling    Left   Low back pain    Mixed ICM & NICM    a. 08/2017 LV gram: EF 45-50%, glob HK; b. 08/2021 Echo: EF 60-65%.   Morbid obesity ([place])    OSA on CPAP    non compliant   Pulmonary hypertension (HCC)     Tobacco Use: Social History   Tobacco Use  Smoking Status Never  Smokeless Tobacco Never    Labs: Review Flowsheet  More data exists      Latest Ref Rng & Units 02/15/2017 05/24/2017 08/23/2017 12/08/2017 09/07/2021  Labs for ITP Cardiac and Pulmonary Rehab  Cholestrol 0 - 200 mg/dL - - - 176  145   LDL (calc) 0 - 99 mg/dL - - - 103  79   HDL-C >40 mg/dL - - - 44  43    Trlycerides <150 mg/dL - - - 143  113   Hemoglobin A1c 4.8 - 5.6 % 8.1  8.1  C 6.6  6.9  7.5     Details      C Corrected result          Exercise Target Goals: Exercise Program Goal: Individual exercise prescription set using results from initial 6 min walk test and THRR while considering  patient's activity barriers and safety.   Exercise Prescription Goal: Initial exercise prescription builds to 30-45 minutes a day of aerobic activity, 2-3 days per week.  Home exercise guidelines will be given to patient during program as part of exercise prescription that the participant will acknowledge.   Education: Aerobic Exercise: - Group verbal and visual presentation on the components of exercise prescription. Introduces F.I.T.T principle from ACSM for exercise prescriptions.  Reviews F.I.T.T. principles of aerobic exercise including progression. Written material given at graduation.   Education: Resistance Exercise: - Group verbal and visual presentation on the components of exercise prescription. Introduces F.I.T.T principle from ACSM for exercise prescriptions  Reviews F.I.T.T. principles of resistance exercise including progression. Written material given at graduation.    Education: Exercise & Equipment Safety: - Individual verbal instruction and demonstration of equipment use and safety with use of the equipment. Flowsheet Row Cardiac Rehab from 11/13/2021 in [facility] Cardiac and Pulmonary Rehab  Date 10/21/21  Educator [facility]  Instruction Review Code 1- Verbalizes Understanding       Education: Exercise Physiology & General Exercise Guidelines: - Group verbal and written instruction with models to review the exercise physiology of the cardiovascular system and associated critical values. Provides general exercise guidelines with specific guidelines to those with heart or lung disease.  Flowsheet Row Cardiac Rehab from 11/13/2021 in [facility] Cardiac and Pulmonary Rehab  Date 11/13/21   Educator NT  Instruction Review Code 1- [employer] Understanding       Education: Flexibility, Balance, Mind/Body Relaxation: - Group verbal and visual presentation with interactive activity on the components of exercise prescription. Introduces F.I.T.T principle from ACSM for exercise prescriptions. Reviews F.I.T.T. principles of flexibility and balance exercise training including progression. Also discusses the mind body connection.  Reviews various relaxation techniques to help reduce and manage stress (i.e. Deep breathing, progressive muscle relaxation, and visualization). Balance handout provided to take home. Written material given at graduation.   Activity Barriers & Risk Stratification:  Activity Barriers & Cardiac Risk Stratification - 10/21/21 1028       Activity Barriers & Cardiac Risk Stratification  Activity Barriers Deconditioning;Muscular Weakness;Shortness of Breath;Balance Concerns;Neck/Spine Problems;Joint Problems   DDD in neck and spine, L shoulder limited   Cardiac Risk Stratification Moderate             6 Minute Walk:  6 Minute Walk     Row Name 10/21/21 1027         6 Minute Walk   Phase Initial     Distance 1060 feet     Walk Time 6 minutes     # of Rest Breaks 0     MPH 2     METS 2.16     RPE 14     Perceived Dyspnea  3     VO2 Peak 7.56     Symptoms Yes (comment)     Comments chest tightness 6/10, SOB     Resting HR 70 bpm     Resting BP 124/64     Resting Oxygen Saturation  98 %     Exercise Oxygen Saturation  during 6 min walk 100 %     Max Ex. HR 109 bpm     Max Ex. BP 128/64     2 Minute Post BP 114/66              Oxygen Initial Assessment:   Oxygen Re-Evaluation:   Oxygen Discharge (Final Oxygen Re-Evaluation):   Initial Exercise Prescription:  Initial Exercise Prescription - 10/21/21 1000       Date of Initial Exercise RX and Referring Provider   Date 10/21/21    Referring Provider [name] MD       Oxygen   Maintain Oxygen Saturation 88% or higher      Treadmill   MPH 1.8    Grade 0.5    Minutes 15    METs 2.52      REL-XR   Level 1    Speed 50    Minutes 15    METs 2.5      T5 Nustep   Level 2    SPM 80    Minutes 15    METs 2.5      Biostep-RELP   Level 2    SPM 50    Minutes 15    METs 2      Track   Laps 28    Minutes 15    METs 2.52      Prescription Details   Frequency (times per week) 2    Duration Progress to 30 minutes of continuous aerobic without signs/symptoms of physical distress      Intensity   THRR 40-80% of Max Heartrate 108-145    Ratings of Perceived Exertion 11-13    Perceived Dyspnea 0-4      Progression   Progression Continue to progress workloads to maintain intensity without signs/symptoms of physical distress.      Resistance Training   Training Prescription Yes    Weight 4 lb    Reps 10-15             Perform Capillary Blood Glucose checks as needed.  Exercise Prescription Changes:   Exercise Prescription Changes     Row Name 10/21/21 1000 11/03/21 1600           Response to Exercise   Blood Pressure (Admit) 124/64 110/66      Blood Pressure (Exercise) 128/64 138/68      Blood Pressure (Exit) 114/66 108/68      Heart Rate (Admit) 70 bpm 72 bpm  Heart Rate (Exercise) 109 bpm 115 bpm      Heart Rate (Exit) 80 bpm 87 bpm      Oxygen Saturation (Admit) 98 % --      Oxygen Saturation (Exercise) 100 % --      Rating of Perceived Exertion (Exercise) 14 14      Perceived Dyspnea (Exercise) 3 --      Symptoms SOB, chest tightness 6/10 none      Comments walk test results 3rd full day of exercise      Duration -- Progress to 30 minutes of  aerobic without signs/symptoms of physical distress      Intensity -- THRR unchanged        Progression   Progression -- Continue to progress workloads to maintain intensity without signs/symptoms of physical distress.      Average METs -- 2.19        Resistance Training    Training Prescription -- Yes      Weight -- 4 lb      Reps -- 10-15        Interval Training   Interval Training -- No        Treadmill   MPH -- 1.8      Grade -- 0.5      Minutes -- 15      METs -- 2.5        REL-XR   Level -- 1      Minutes -- 15      METs -- 1.3        T5 Nustep   Level -- 2      Minutes -- 15      METs -- 2.4        Biostep-RELP   Level -- 2      Minutes -- 15      METs -- 2        Oxygen   Maintain Oxygen Saturation -- 88% or higher               Exercise Comments:   Exercise Comments     Row Name 10/23/21 0800           Exercise Comments First full day of exercise!  Patient was oriented to gym and equipment including functions, settings, policies, and procedures.  Patient's individual exercise prescription and treatment plan were reviewed.  All starting workloads were established based on the results of the 6 minute walk test done at initial orientation visit.  The plan for exercise progression was also introduced and progression will be customized based on patient's performance and goals.                Exercise Goals and Review:   Exercise Goals     Row Name 10/21/21 1031             Exercise Goals   Increase Physical Activity Yes       Intervention Provide advice, education, support and counseling about physical activity/exercise needs.;Develop an individualized exercise prescription for aerobic and resistive training based on initial evaluation findings, risk stratification, comorbidities and participant's personal goals.       Expected Outcomes Long Term: Add in home exercise to make exercise part of routine and to increase amount of physical activity.;Short Term: Attend rehab on a regular basis to increase amount of physical activity.;Long Term: Exercising regularly at least 3-5 days a week.       Increase Strength and Stamina Yes  Intervention Provide advice, education, support and counseling about physical  activity/exercise needs.;Develop an individualized exercise prescription for aerobic and resistive training based on initial evaluation findings, risk stratification, comorbidities and participant's personal goals.       Expected Outcomes Short Term: Increase workloads from initial exercise prescription for resistance, speed, and METs.;Short Term: Perform resistance training exercises routinely during rehab and add in resistance training at home;Long Term: Improve cardiorespiratory fitness, muscular endurance and strength as measured by increased METs and functional capacity (6MWT)       Able to understand and use rate of perceived exertion (RPE) scale Yes       Intervention Provide education and explanation on how to use RPE scale       Expected Outcomes Short Term: Able to use RPE daily in rehab to express subjective intensity level;Long Term:  Able to use RPE to guide intensity level when exercising independently       Able to understand and use Dyspnea scale Yes       Intervention Provide education and explanation on how to use Dyspnea scale       Expected Outcomes Short Term: Able to use Dyspnea scale daily in rehab to express subjective sense of shortness of breath during exertion;Long Term: Able to use Dyspnea scale to guide intensity level when exercising independently       Knowledge and understanding of Target Heart Rate Range (THRR) Yes       Intervention Provide education and explanation of THRR including how the numbers were predicted and where they are located for reference       Expected Outcomes Short Term: Able to state/look up THRR;Short Term: Able to use daily as guideline for intensity in rehab;Long Term: Able to use THRR to govern intensity when exercising independently       Able to check pulse independently Yes       Intervention Provide education and demonstration on how to check pulse in carotid and radial arteries.;Review the importance of being able to check your own pulse for  safety during independent exercise       Expected Outcomes Short Term: Able to explain why pulse checking is important during independent exercise;Long Term: Able to check pulse independently and accurately       Understanding of Exercise Prescription Yes       Intervention Provide education, explanation, and written materials on patient's individual exercise prescription       Expected Outcomes Short Term: Able to explain program exercise prescription;Long Term: Able to explain home exercise prescription to exercise independently                Exercise Goals Re-Evaluation :  Exercise Goals Re-Evaluation     Row Name 10/23/21 0800 11/03/21 1619 11/13/21 0732         Exercise Goal Re-Evaluation   Exercise Goals Review Able to understand and use rate of perceived exertion (RPE) scale;Knowledge and understanding of Target Heart Rate Range (THRR);Understanding of Exercise Prescription;Increase Physical Activity;Increase Strength and Stamina;Able to understand and use Dyspnea scale;Able to check pulse independently Increase Physical Activity;Increase Strength and Stamina;Understanding of Exercise Prescription Increase Physical Activity;Increase Strength and Stamina;Understanding of Exercise Prescription;Able to understand and use rate of perceived exertion (RPE) scale;Able to understand and use Dyspnea scale;Knowledge and understanding of Target Heart Rate Range (THRR);Able to check pulse independently     Comments Reviewed RPE and dyspnea scales, THR and program prescription with pt today.  Pt voiced understanding and was given a copy  of goals to take home. Brett Marshall is doing well for the first couple of sessions that he has been here. He was able to follow his initial exercise prescription. He had a hypertensive BP response on his first day but was re-checked and back to normal range. We will continue to monitor as he progresses in the program. Reviewed home exercise with pt today.  Pt plans to walk  and use staff videos at home for exercise.  Reviewed THR, pulse, RPE, sign and symptoms, pulse oximetery and when to call 911 or MD.  Also discussed weather considerations and indoor options.  Pt voiced understanding.  Brett Marshall mentioned today that he is having chest tightness today like pressure on his chest.  It started early this morning.  He was encourged to call his doctor.     Expected Outcomes Short: Use RPE daily to regulate intensity. Long: Follow program prescription in THR. Short: Follow current exercise prescription Long: Increase overall strength and MET level Short: Call doctor about chest pressure Long: Conitnue to exercise independently              Discharge Exercise Prescription (Final Exercise Prescription Changes):  Exercise Prescription Changes - 11/03/21 1600       Response to Exercise   Blood Pressure (Admit) 110/66    Blood Pressure (Exercise) 138/68    Blood Pressure (Exit) 108/68    Heart Rate (Admit) 72 bpm    Heart Rate (Exercise) 115 bpm    Heart Rate (Exit) 87 bpm    Rating of Perceived Exertion (Exercise) 14    Symptoms none    Comments 3rd full day of exercise    Duration Progress to 30 minutes of  aerobic without signs/symptoms of physical distress    Intensity THRR unchanged      Progression   Progression Continue to progress workloads to maintain intensity without signs/symptoms of physical distress.    Average METs 2.19      Resistance Training   Training Prescription Yes    Weight 4 lb    Reps 10-15      Interval Training   Interval Training No      Treadmill   MPH 1.8    Grade 0.5    Minutes 15    METs 2.5      REL-XR   Level 1    Minutes 15    METs 1.3      T5 Nustep   Level 2    Minutes 15    METs 2.4      Biostep-RELP   Level 2    Minutes 15    METs 2      Oxygen   Maintain Oxygen Saturation 88% or higher             Nutrition:  Target Goals: Understanding of nutrition guidelines, daily intake of sodium  <1560m, cholesterol <2067m calories 30% from fat and 7% or less from saturated fats, daily to have 5 or more servings of fruits and vegetables.  Education: All About Nutrition: -Group instruction provided by verbal, written material, interactive activities, discussions, models, and posters to present general guidelines for heart healthy nutrition including fat, fiber, MyPlate, the role of sodium in heart healthy nutrition, utilization of the nutrition label, and utilization of this knowledge for meal planning. Follow up email sent as well. Written material given at graduation. [facility]  Education need identified 10/21/21  Biometrics:  Pre Biometrics - 10/21/21 1031       Pre Biometrics   Height 5' 9.5" (1.765 m)    Weight 329 lb 8 oz (149.5 kg)    BMI (Calculated) 47.98    Single Leg Stand 3.5 seconds              Nutrition Therapy Plan and Nutrition Goals:  Nutrition Therapy & Goals - 11/11/21 1329       Nutrition Therapy   Diet Heart Healthy, T2DM MNT    Drug/Food Interactions Statins/Certain Fruits    Protein (specify units) 80g    Fiber 30 grams    Whole Grain Foods 3 servings    Saturated Fats 16 max. grams    Fruits and Vegetables 8 servings/day    Sodium 2 grams      Personal Nutrition Goals   Nutrition Goal ST: swap out some grains with whole grains - try whole grain bread, brainstorm easy to prep meals  LT: Limit eating out <3x/week, follow MyPlate guidelines, limit Na <2g    Comments 56 y.o. M admitted to cardiac rehab s/p coronary artery stent placement. PMHx includes HTN, HLD, chronic systolic congestive heart failure, mixed ischemic and nonischemic cardiomyopathy, CAD, T2DM, GERD. Relevant medications include lipitor, cymbalta, pepcid, lasix, glipizide, jardiance, metformin, tramadol, trazodone. PYP Score: 53. Vegetables & Fruits 6/12. Breads, Grains & Cereals 6/12. Red & Processed  Meat 8/12. Poultry 0/2. Fish & Shellfish 0/4. B: Bagel (cinnamon raisin - thomas's) with cream cheese or toast (italian white bread with peanut butter) and sometimes bacon, egg and cheese biscuit - with unsweetened tea or OJ S: granola bar with peanuts in it or some fruit L: sandwich ([place] with mayo and american cheese on white bread) or chicken sandwich (roll or bun) or sometimes pizza on the weekend or sometimes house salad ([place] or caesar). S: usually no snack, but if he does he will have sun chips (he likes the crunch factor). D: house salad, door dash (salads from [employer] with chicken, hamburger, hotdog, steak, pizza, spaghetti, [place] food, Hi-Nella (brown rice with order)).  S: sun chips sometimes. Drinks: orange soda with no added sugar or gaterade sometimes, not usually water. He will occasionally get heartburn. Discussed general heart healthy eating and diabetes friendly eating.      Intervention Plan   Intervention Prescribe, educate and counsel regarding individualized specific dietary modifications aiming towards targeted core components such as weight, hypertension, lipid management, diabetes, heart failure and other comorbidities.    Expected Outcomes Long Term Goal: Adherence to prescribed nutrition plan.;Short Term Goal: A plan has been developed with personal nutrition goals set during dietitian appointment.;Short Term Goal: Understand basic principles of dietary content, such as calories, fat, sodium, cholesterol and nutrients.             Nutrition Assessments:  MEDIFICTS Score Key: ?70 Need to make dietary changes  40-70 Heart Healthy Diet ? 40 Therapeutic Level Cholesterol Diet  Flowsheet Row Cardiac Rehab from 10/21/2021 in [facility] Cardiac and Pulmonary Rehab  Picture Your Plate Total Score on Admission 53      Picture Your Plate Scores: <38 Unhealthy dietary pattern with much room for improvement. 41-50 Dietary pattern unlikely to meet recommendations for  good health and room for improvement. 51-60 More healthful dietary pattern, with some room for improvement.  >60 Healthy dietary pattern, although there may be some specific behaviors that could be improved.    Nutrition Goals Re-Evaluation:   Nutrition Goals Discharge (  Final Nutrition Goals Re-Evaluation):   Psychosocial: Target Goals: Acknowledge presence or absence of significant depression and/or stress, maximize coping skills, provide positive support system. Participant is able to verbalize types and ability to use techniques and skills needed for reducing stress and depression.   Education: Stress, Anxiety, and Depression - Group verbal and visual presentation to define topics covered.  Reviews how body is impacted by stress, anxiety, and depression.  Also discusses healthy ways to reduce stress and to treat/manage anxiety and depression.  Written material given at graduation. Flowsheet Row Cardiac Rehab from 11/13/2021 in [facility] Cardiac and Pulmonary Rehab  Date 11/06/21  Educator [facility]  Instruction Review Code 1- [employer] Understanding       Education: Sleep Hygiene -Provides group verbal and written instruction about how sleep can affect your health.  Define sleep hygiene, discuss sleep cycles and impact of sleep habits. Review good sleep hygiene tips.    Initial Review & Psychosocial Screening:  Initial Psych Review & Screening - 10/08/21 1343       Initial Review   Current issues with None Identified      Family Dynamics   Good Support System? Yes   Wife,friends, church friends     Barriers   Psychosocial barriers to participate in program There are no identifiable barriers or psychosocial needs.      Screening Interventions   Interventions Encouraged to exercise;To provide support and resources with identified psychosocial needs;Provide feedback about the scores to participant    Expected Outcomes Short Term goal: Utilizing psychosocial counselor, staff and  physician to assist with identification of specific Stressors or current issues interfering with healing process. Setting desired goal for each stressor or current issue identified.;Long Term Goal: Stressors or current issues are controlled or eliminated.;Short Term goal: Identification and review with participant of any Quality of Life or Depression concerns found by scoring the questionnaire.;Long Term goal: The participant improves quality of Life and PHQ9 Scores as seen by post scores and/or verbalization of changes             Quality of Life Scores:   Quality of Life - 10/21/21 1032       Quality of Life   Select Quality of Life      Quality of Life Scores   Health/Function Pre 12.2 %    Socioeconomic Pre 22.93 %    Psych/Spiritual Pre 16.29 %    Family Pre 22.5 %    GLOBAL Pre 16.59 %            Scores of 19 and below usually indicate a poorer quality of life in these areas.  A difference of  2-3 points is a clinically meaningful difference.  A difference of 2-3 points in the total score of the Quality of Life Index has been associated with significant improvement in overall quality of life, self-image, physical symptoms, and general health in studies assessing change in quality of life.  PHQ-9: Review Flowsheet  More data exists      11/13/2021 10/21/2021 12/08/2017 08/23/2017 05/24/2017  Depression screen PHQ 2/9  Decreased Interest 1 2 0 0 0  Down, Depressed, Hopeless 0 0 0 0 0  PHQ - 2 Score 1 2 0 0 0  Altered sleeping 2 2 - - -  Tired, decreased energy 2 3 - - -  Change in appetite 1 2 - - -  Feeling bad or failure about yourself  0 0 - - -  Trouble concentrating 0 0 - - -  Moving slowly or fidgety/restless 0 1 - - -  Suicidal thoughts 0 0 - - -  PHQ-9 Score 6 10 - - -  Difficult doing work/chores Somewhat difficult Somewhat difficult - - -   Interpretation of Total Score  Total Score Depression Severity:  1-4 = Minimal depression, 5-9 = Mild depression, 10-14 =  Moderate depression, 15-19 = Moderately severe depression, 20-27 = Severe depression   Psychosocial Evaluation and Intervention:  Psychosocial Evaluation - 10/08/21 1401       Psychosocial Evaluation & Interventions   Interventions Encouraged to exercise with the program and follow exercise prescription    Comments Brett Marshall has no barriers to attending the program. He lives with his wife,. His wife, friends and church friends are his support team. Brett Marshall is working on weight loss. He has lost over 100 lbs and is continuing the weight loss journey. He wants to learn how to manage an exercise program and to continue with his weight loss. HIs long term goal is to reduce the anmount of medicine he is taking.    Expected Outcomes [facility] Resean is able to attend all scheduled sessions, he works with EP and RD to continue his weight loss journey, and he manages to progress to a routine exercise program LTG Brett Marshall continues to progress with exercise and weight loss after discharge. He is able to in the longterm to decrease his medication needs    Continue Psychosocial Services  Follow up required by staff             Psychosocial Re-Evaluation:  Psychosocial Re-Evaluation     [place] Name 11/13/21 0735             Psychosocial Re-Evaluation   Current issues with Current Stress Concerns;Current Sleep Concerns       Comments Brett Marshall's PHQ has improved to 6 down from 10.  He is still having difficulty with sleep and energy levels.  He is also having chest tightness today.  He was encouraged to talk to the doctor about chest tightness.  He has not had a recent sleep study but will see a pulmonologist in August and wants to get a new CPAP mask as his does not stay on very well.  Thus his CPAP compliance is not the greatest as it usually falls off during the night.  These are his biggest sources of stressors.  He has noticed the bruising popping up from his blood thinner that sometimes catches him off guard.        Expected Outcomes Short: Talk to doctor about chest tightness Long: Continue to work on sleep       Interventions Encouraged to attend Cardiac Rehabilitation for the exercise       Continue Psychosocial Services  Follow up required by staff                Psychosocial Discharge (Final Psychosocial Re-Evaluation):  Psychosocial Re-Evaluation - 11/13/21 0735       Psychosocial Re-Evaluation   Current issues with Current Stress Concerns;Current Sleep Concerns    Comments Brett Marshall's PHQ has improved to 6 down from 10.  He is still having difficulty with sleep and energy levels.  He is also having chest tightness today.  He was encouraged to talk to the doctor about chest tightness.  He has not had a recent sleep study but will see a pulmonologist in August and wants to get a new CPAP mask as his does not stay on very well.  Thus his  CPAP compliance is not the greatest as it usually falls off during the night.  These are his biggest sources of stressors.  He has noticed the bruising popping up from his blood thinner that sometimes catches him off guard.    Expected Outcomes Short: Talk to doctor about chest tightness Long: Continue to work on sleep    Interventions Encouraged to attend Cardiac Rehabilitation for the exercise    Continue Psychosocial Services  Follow up required by staff             Vocational Rehabilitation: Provide vocational rehab assistance to qualifying candidates.   Vocational Rehab Evaluation & Intervention:   Education: Education Goals: Education classes will be provided on a variety of topics geared toward better understanding of heart health and risk factor modification. Participant will state understanding/return demonstration of topics presented as noted by education test scores.  Learning Barriers/Preferences:   General Cardiac Education Topics:  AED/CPR: - Group verbal and written instruction with the use of models to demonstrate the basic use of the  AED with the basic ABC's of resuscitation.   Anatomy and Cardiac Procedures: - Group verbal and visual presentation and models provide information about basic cardiac anatomy and function. Reviews the testing methods done to diagnose heart disease and the outcomes of the test results. Describes the treatment choices: Medical Management, Angioplasty, or Coronary Bypass Surgery for treating various heart conditions including Myocardial Infarction, Angina, Valve Disease, and Cardiac Arrhythmias.  Written material given at graduation. Flowsheet Row Cardiac Rehab from 11/13/2021 in [facility] Cardiac and Pulmonary Rehab  Education need identified 10/21/21       Medication Safety: - Group verbal and visual instruction to review commonly prescribed medications for heart and lung disease. Reviews the medication, class of the drug, and side effects. Includes the steps to properly store meds and maintain the prescription regimen.  Written material given at graduation.   Intimacy: - Group verbal instruction through game format to discuss how heart and lung disease can affect sexual intimacy. Written material given at graduation..   Know Your Numbers and Heart Failure: - Group verbal and visual instruction to discuss disease risk factors for cardiac and pulmonary disease and treatment options.  Reviews associated critical values for Overweight/Obesity, Hypertension, Cholesterol, and Diabetes.  Discusses basics of heart failure: signs/symptoms and treatments.  Introduces Heart Failure Zone chart for action plan for heart failure.  Written material given at graduation.   Infection Prevention: - Provides verbal and written material to individual with discussion of infection control including proper hand washing and proper equipment cleaning during exercise session. Flowsheet Row Cardiac Rehab from 11/13/2021 in [facility] Cardiac and Pulmonary Rehab  Date 10/21/21  Educator [facility]  Instruction Review Code 1- Verbalizes  Understanding       Falls Prevention: - Provides verbal and written material to individual with discussion of falls prevention and safety. Flowsheet Row Cardiac Rehab from 11/13/2021 in [facility] Cardiac and Pulmonary Rehab  Date 10/08/21  Educator SB  Instruction Review Code 1- Verbalizes Understanding       Other: -Provides group and verbal instruction on various topics (see comments)   Knowledge Questionnaire Score:  Knowledge Questionnaire Score - 10/21/21 1033       Knowledge Questionnaire Score   Pre Score 24/26             Core Components/Risk Factors/Patient Goals at Admission:  Personal Goals and Risk Factors at Admission - 10/21/21 1033       Core Components/Risk Factors/Patient Goals  on Admission    Weight Management Yes;Obesity;Weight Loss    Intervention Weight Management: Develop a combined nutrition and exercise program designed to reach desired caloric intake, while maintaining appropriate intake of nutrient and fiber, sodium and fats, and appropriate energy expenditure required for the weight goal.;Weight Management/Obesity: Establish reasonable short term and long term weight goals.;Obesity: Provide education and appropriate resources to help participant work on and attain dietary goals.;Weight Management: Provide education and appropriate resources to help participant work on and attain dietary goals.    Admit Weight 329 lb 8 oz (149.5 kg)    Goal Weight: Short Term 324 lb (147 kg)    Goal Weight: Long Term 225 lb (102.1 kg)    Expected Outcomes Short Term: Continue to assess and modify interventions until short term weight is achieved;Long Term: Adherence to nutrition and physical activity/exercise program aimed toward attainment of established weight goal;Weight Loss: Understanding of general recommendations for a balanced deficit meal plan, which promotes 1-2 lb weight loss per week and includes a negative energy balance of [phone] kcal/d;Understanding  recommendations for meals to include 15-35% energy as protein, 25-35% energy from fat, 35-60% energy from carbohydrates, less than 246m of dietary cholesterol, 20-35 gm of total fiber daily;Understanding of distribution of calorie intake throughout the day with the consumption of 4-5 meals/snacks    Diabetes Yes    Intervention Provide education about signs/symptoms and action to take for hypo/hyperglycemia.;Provide education about proper nutrition, including hydration, and aerobic/resistive exercise prescription along with prescribed medications to achieve blood glucose in normal ranges: Fasting glucose 65-99 mg/dL    Expected Outcomes Short Term: Participant verbalizes understanding of the signs/symptoms and immediate care of hyper/hypoglycemia, proper foot care and importance of medication, aerobic/resistive exercise and nutrition plan for blood glucose control.;Long Term: Attainment of HbA1C < 7%.    Heart Failure Yes    Intervention Provide a combined exercise and nutrition program that is supplemented with education, support and counseling about heart failure. Directed toward relieving symptoms such as shortness of breath, decreased exercise tolerance, and extremity edema.    Expected Outcomes Short term: Attendance in program 2-3 days a week with increased exercise capacity. Reported lower sodium intake. Reported increased fruit and vegetable intake. Reports medication compliance.;Improve functional capacity of life;Short term: Daily weights obtained and reported for increase. Utilizing diuretic protocols set by physician.;Long term: Adoption of self-care skills and reduction of barriers for early signs and symptoms recognition and intervention leading to self-care maintenance.    Hypertension Yes    Intervention Provide education on lifestyle modifcations including regular physical activity/exercise, weight management, moderate sodium restriction and increased consumption of fresh fruit, vegetables,  and low fat dairy, alcohol moderation, and smoking cessation.;Monitor prescription use compliance.    Expected Outcomes Short Term: Continued assessment and intervention until BP is < 140/938mHG in hypertensive participants. < 130/8056mG in hypertensive participants with diabetes, heart failure or chronic kidney disease.;Long Term: Maintenance of blood pressure at goal levels.    Lipids Yes    Intervention Provide education and support for participant on nutrition & aerobic/resistive exercise along with prescribed medications to achieve LDL <58m33mDL >40mg20m Expected Outcomes Short Term: Participant states understanding of desired cholesterol values and is compliant with medications prescribed. Participant is following exercise prescription and nutrition guidelines.;Long Term: Cholesterol controlled with medications as prescribed, with individualized exercise RX and with personalized nutrition plan. Value goals: LDL < 58mg,90m > 40 mg.  Education:Diabetes - Individual verbal and written instruction to review signs/symptoms of diabetes, desired ranges of glucose level fasting, after meals and with exercise. Acknowledge that pre and post exercise glucose checks will be done for 3 sessions at entry of program. [place] from 11/13/2021 in [facility] Cardiac and Pulmonary Rehab  Date 10/21/21  Educator [facility]  Instruction Review Code 1- Verbalizes Understanding       Core Components/Risk Factors/Patient Goals Review:   Goals and Risk Factor Review     Row Name 11/13/21 0741             Core Components/Risk Factors/Patient Goals Review   Personal Goals Review Weight Management/Obesity;Hypertension;Diabetes;Lipids       Review Brett Marshall is doing well in rehab.  His weight has crept up some to 333 lb today.  He is not eating more and just met with dietitian this week.  We also reviewed home exercise today, so adding that it will help with calorie burn.  His pressures are  doing well and checks them at home with a wrist monitor.  He also checks his sugars reguarly at home, today they were 140 and stay around that range.  He has been having chest pain and was encouraged to stop by office to talk to nurse or doctor.       Expected Outcomes Short: Continue to work on weight loss Long: Continue to monitor risk factors                Core Components/Risk Factors/Patient Goals at Discharge (Final Review):   Goals and Risk Factor Review - 11/13/21 0741       Core Components/Risk Factors/Patient Goals Review   Personal Goals Review Weight Management/Obesity;Hypertension;Diabetes;Lipids    Review Brett Marshall is doing well in rehab.  His weight has crept up some to 333 lb today.  He is not eating more and just met with dietitian this week.  We also reviewed home exercise today, so adding that it will help with calorie burn.  His pressures are doing well and checks them at home with a wrist monitor.  He also checks his sugars reguarly at home, today they were 140 and stay around that range.  He has been having chest pain and was encouraged to stop by office to talk to nurse or doctor.    Expected Outcomes Short: Continue to work on weight loss Long: Continue to monitor risk factors             ITP Comments:  ITP Comments     Row Name 10/08/21 1401 10/21/21 1027 10/23/21 0800 10/29/21 1258 11/11/21 1422   ITP Comments Virtual orientation call completed today. he has an appointment on Date: 10/21/2021  for EP eval and gym Orientation.  Documentation of diagnosis can be found in [facility] Date: 09/04/2021 . Completed 6MWT and gym orientation. Initial ITP created and sent for review to Dr. Emily Filbert, Medical Director. First full day of exercise!  Patient was oriented to gym and equipment including functions, settings, policies, and procedures.  Patient's individual exercise prescription and treatment plan were reviewed.  All starting workloads were established based on the results  of the 6 minute walk test done at initial orientation visit.  The plan for exercise progression was also introduced and progression will be customized based on patient's performance and goals. 30 Day review completed. Medical Director ITP review done, changes made as directed, and signed approval by Medical Director. Completed initial RD consultation    Row  Name 11/26/21 0942           ITP Comments 30 Day review completed. Medical Director ITP review done, changes made as directed, and signed approval by Medical Director.                Comments:

## 2021-11-27 DIAGNOSIS — Z48812 Encounter for surgical aftercare following surgery on the circulatory system: Secondary | ICD-10-CM | POA: Diagnosis not present

## 2021-11-27 DIAGNOSIS — Z955 Presence of coronary angioplasty implant and graft: Secondary | ICD-10-CM

## 2021-11-27 NOTE — Progress Notes (Signed)
Daily Session Note  Patient Details  Name: Brett Marshall MRN: [id number] Date of Birth: 1965/10/04 Referring Provider:   Flowsheet Row Cardiac Rehab from 10/21/2021 in [facility] Cardiac and Pulmonary Rehab  Referring Provider [name] MD       Encounter Date: 11/27/2021  Check In:  Session Check In - 11/27/21 0717       Check-In   Supervising physician immediately available to respond to emergencies See telemetry face sheet for immediately available ER MD    Location [facility]-Cardiac & Pulmonary Rehab    Staff Present Justin Mend, RCP,RRT,BSRT;Mylz Yuan, MPA, RN;Melissa [place], RDN, LDN;Jessica [place], MA, RCEP, CCRP, CCET    Virtual Visit No    Medication changes reported     No    Fall or balance concerns reported    No    Warm-up and Cool-down Performed on first and last piece of equipment    Resistance Training Performed Yes    VAD Patient? No    PAD/SET Patient? No      Pain Assessment   Currently in Pain? No/denies                Social History   Tobacco Use  Smoking Status Never  Smokeless Tobacco Never    Goals Met:  Independence with exercise equipment Exercise tolerated well No report of concerns or symptoms today Strength training completed today  Goals Unmet:  Not Applicable  Comments: Pt able to follow exercise prescription today without complaint.  Will continue to monitor for progression.    Dr. Emily Filbert is Medical Director for [place].  Dr. Ottie Glazier is Medical Director for [facility] Pulmonary Rehabilitation.

## 2021-12-01 ENCOUNTER — Other Ambulatory Visit: Payer: Self-pay

## 2021-12-02 ENCOUNTER — Encounter: Payer: Self-pay | Admitting: Cardiovascular Disease

## 2021-12-02 MED ORDER — ASPIRIN 81 MG PO TBEC: 81.0000 mg | DELAYED_RELEASE_TABLET | Freq: Every day | ORAL | 6 refills | Status: DC

## 2021-12-02 NOTE — Telephone Encounter (Signed)
Please advise if ok to refill Aspirin last filled by:  [place] [facility] Ordering/Authorizing: Damita Lack, MD

## 2021-12-08 ENCOUNTER — Encounter: Payer: BLUE CROSS/BLUE SHIELD | Admitting: *Deleted

## 2021-12-08 DIAGNOSIS — Z955 Presence of coronary angioplasty implant and graft: Secondary | ICD-10-CM | POA: Diagnosis not present

## 2021-12-08 DIAGNOSIS — Z48812 Encounter for surgical aftercare following surgery on the circulatory system: Secondary | ICD-10-CM | POA: Diagnosis not present

## 2021-12-08 LAB — GLUCOSE, CAPILLARY: Glucose-Capillary: 245 mg/dL — ABNORMAL HIGH (ref 70–99)

## 2021-12-08 NOTE — Progress Notes (Signed)
Daily Session Note  Patient Details  Name: Brett Marshall MRN: [id number] Date of Birth: 05-01-1966 Referring Provider:   Flowsheet Row Cardiac Rehab from 10/21/2021 in [facility] Cardiac and Pulmonary Rehab  Referring Provider [name] MD       Encounter Date: 12/08/2021  Check In:  Session Check In - 12/08/21 0806       Check-In   Supervising physician immediately available to respond to emergencies See telemetry face sheet for immediately available ER MD    Location [facility]-Cardiac & Pulmonary Rehab    Staff Present Heath Lark, RN, BSN, CCRP;Jessica [place], MA, RCEP, CCRP, [place], BS, ACSM CEP, Exercise Physiologist    Virtual Visit No    Medication changes reported     No    Fall or balance concerns reported    No    Warm-up and Cool-down Performed on first and last piece of equipment    Resistance Training Performed Yes    VAD Patient? No    PAD/SET Patient? No      Pain Assessment   Currently in Pain? No/denies                Social History   Tobacco Use  Smoking Status Never  Smokeless Tobacco Never    Goals Met:  Independence with exercise equipment Exercise tolerated well No report of concerns or symptoms today  Goals Unmet:  Not Applicable  Comments: Pt able to follow exercise prescription today without complaint.  Will continue to monitor for progression. Feeling shaky at end of session. Blood Sugarcheck 282m/Dl  Bp in normal range.   Dr. [place]mily Filbertis Medical Director [facility]  Dr. FOttie Glazieris Medical Director [facility].

## 2021-12-11 ENCOUNTER — Encounter: Payer: BLUE CROSS/BLUE SHIELD | Admitting: *Deleted

## 2021-12-11 DIAGNOSIS — Z48812 Encounter for surgical aftercare following surgery on the circulatory system: Secondary | ICD-10-CM | POA: Diagnosis not present

## 2021-12-11 DIAGNOSIS — Z955 Presence of coronary angioplasty implant and graft: Secondary | ICD-10-CM | POA: Diagnosis not present

## 2021-12-11 NOTE — Progress Notes (Signed)
Daily Session Note  Patient Details  Name: Brett Marshall MRN: [id number] Date of Birth: 08-17-1965 Referring Provider:   Flowsheet Row Cardiac Rehab from 10/21/2021 in [facility] Cardiac and Pulmonary Rehab  Referring Provider [name] MD       Encounter Date: 12/11/2021  Check In:  Session Check In - 12/11/21 0814       Check-In   Supervising physician immediately available to respond to emergencies See telemetry face sheet for immediately available ER MD    Location [facility]-Cardiac & Pulmonary Rehab    Staff Present Heath Lark, RN, BSN, CCRP;Jessica [place], MA, RCEP, CCRP, Marylynn Pearson, MS, ASCM CEP, Exercise Physiologist    Virtual Visit No    Medication changes reported     No    Fall or balance concerns reported    No    Warm-up and Cool-down Performed on first and last piece of equipment    Resistance Training Performed Yes    VAD Patient? No    PAD/SET Patient? No      Pain Assessment   Currently in Pain? No/denies                Social History   Tobacco Use  Smoking Status Never  Smokeless Tobacco Never    Goals Met:  Independence with exercise equipment Exercise tolerated well No report of concerns or symptoms today  Goals Unmet:  Not Applicable  Comments: Pt able to follow exercise prescription today without complaint.  Will continue to monitor for progression.    Dr. Emily Filbert is Medical Director for [place].  Dr. Ottie Glazier is Medical Director for [facility] Pulmonary Rehabilitation.

## 2021-12-15 ENCOUNTER — Encounter: Payer: BLUE CROSS/BLUE SHIELD | Admitting: *Deleted

## 2021-12-15 DIAGNOSIS — Z955 Presence of coronary angioplasty implant and graft: Secondary | ICD-10-CM | POA: Diagnosis not present

## 2021-12-15 DIAGNOSIS — Z48812 Encounter for surgical aftercare following surgery on the circulatory system: Secondary | ICD-10-CM | POA: Diagnosis not present

## 2021-12-15 NOTE — Progress Notes (Signed)
Daily Session Note  Patient Details  Name: Brett Marshall MRN: [id number] Date of Birth: 11/05/1965 Referring Provider:   Flowsheet Row Cardiac Rehab from 10/21/2021 in [facility] Cardiac and Pulmonary Rehab  Referring Provider Arida, Muhammad MD       Encounter Date: 12/15/2021  Check In:  Session Check In - 12/15/21 0835       Check-In   Supervising physician immediately available to respond to emergencies See telemetry face sheet for immediately available ER MD    Location [facility]-Cardiac & Pulmonary Rehab    Staff Present Susanne Bice, RN, BSN, CCRP;Kelly Hayes, BS, ACSM CEP, Exercise Physiologist;Noah Tickle, BS, Exercise Physiologist    Virtual Visit No    Medication changes reported     No    Fall or balance concerns reported    No    Warm-up and Cool-down Performed on first and last piece of equipment    Resistance Training Performed Yes    VAD Patient? No    PAD/SET Patient? No      Pain Assessment   Currently in Pain? No/denies                Social History   Tobacco Use  Smoking Status Never  Smokeless Tobacco Never    Goals Met:  Independence with exercise equipment Exercise tolerated well No report of concerns or symptoms today  Goals Unmet:  Not Applicable  Comments: Pt able to follow exercise prescription today without complaint.  Will continue to monitor for progression.    Dr. Mark Miller is Medical Director for [facility].  Dr. Fuad Aleskerov is Medical Director for [facility] Pulmonary Rehabilitation. 

## 2021-12-24 ENCOUNTER — Encounter: Payer: Self-pay | Admitting: *Deleted

## 2021-12-24 DIAGNOSIS — Z955 Presence of coronary angioplasty implant and graft: Secondary | ICD-10-CM

## 2021-12-24 NOTE — Progress Notes (Signed)
Cardiac Individual Treatment Plan  Patient Details  Name: MARCAS BOWSHER MRN: [id number] Date of Birth: 12-20-1965 Referring Provider:   Flowsheet Row Cardiac Rehab from 10/21/2021 in [facility] Cardiac and Pulmonary Rehab  Referring Provider [name] MD       Initial Encounter Date:  Flowsheet Row Cardiac Rehab from 10/21/2021 in [facility] Cardiac and Pulmonary Rehab  Date 10/21/21       Visit Diagnosis: Status post coronary artery stent placement  Patient's Home Medications on Admission:  Current Outpatient Medications:    albuterol (VENTOLIN HFA) 108 (90 Base) MCG/ACT inhaler, Inhale 1-2 puffs into the lungs every 6 (six) hours as needed for wheezing or shortness of breath., Disp: 18 g, Rfl: 1   aspirin EC 81 MG tablet, Take 1 tablet (81 mg total) by mouth daily. Swallow whole., Disp: 30 tablet, Rfl: 6   atorvastatin (LIPITOR) 40 MG tablet, Take 1 tablet (40 mg total) by mouth daily at 6 PM., Disp: 30 tablet, Rfl: 2   BRILINTA 90 MG TABS tablet, Take 1 tablet (90 mg total) by mouth 2 (two) times daily., Disp: 60 tablet, Rfl: 3   cyclobenzaprine (FLEXERIL) 10 MG tablet, Take 1 tablet (10 mg total) by mouth 3 (three) times daily as needed.For spasms, Disp: 90 tablet, Rfl: 5   DULoxetine (CYMBALTA) 30 MG capsule, Take 2 capsules (60 mg total) by mouth daily., Disp: , Rfl:    famotidine (PEPCID) 40 MG tablet, Take 40 mg by mouth daily. , Disp: , Rfl:    furosemide (LASIX) 20 MG tablet, Take 1 tablet (20 mg total) by mouth daily as needed (for SOB or weight gain of 3 lbs overnight)., Disp: 90 tablet, Rfl: 3   glipiZIDE (GLUCOTROL) 5 MG tablet, Take 1 tablet (5 mg total) by mouth 2 (two) times daily before a meal., Disp: 180 tablet, Rfl: 1   glucose blood (GE100 BLOOD GLUCOSE TEST) test strip, Use as instructed, Disp: 100 each, Rfl: 12   JARDIANCE 25 MG TABS tablet, Take 25 mg by mouth daily., Disp: , Rfl:    Lancets (ONETOUCH ULTRASOFT) lancets, Check sugar twice daily, Disp: 100 each, Rfl:  12   metFORMIN (GLUCOPHAGE) 1000 MG tablet, Take 1 tablet (1,000 mg total) by mouth 2 (two) times daily with a meal., Disp: 180 tablet, Rfl: 1   metoprolol tartrate (LOPRESSOR) 100 MG tablet, Take 1 tablet (100 mg total) by mouth 2 (two) times daily., Disp: 60 tablet, Rfl: 3   traMADol (ULTRAM) 50 MG tablet, Take 1 tablet (50 mg total) by mouth every 6 (six) hours as needed., Disp: 20 tablet, Rfl: 0   traZODone (DESYREL) 50 MG tablet, Take 50-100 mg by mouth at bedtime., Disp: , Rfl:   Past Medical History: Past Medical History:  Diagnosis Date   Asthma    CAD (coronary artery disease)    a. 08/2017 Cath: Diff nonobs dzs, EF 45-50%, diff HK; b. 08/2021 MV: Ant/apical ishemia; c. 08/2021 PCI: LAD 95ost (4.0x15 Onyx Frontier DES), EF 55-65%; c. 09/2021 Cath: LM nl, LAD patent stent, 66m 30d, RI min irregs, LCX min irregs, RCA 50p. LVOT grad 50-868mg (rest), 70-11071m (provoked).   DDD (degenerative disc disease), lumbar    Depression    Diabetes mellitus without complication ([place]  Essential hypertension, benign    HFimpEF (heart failure with improved ejection fraction) ([place]  a. 08/2017 LV gram: EF 45-50%, glob HK; b. 08/2021 Echo: EF 60-65%, no rwma, GrI DD, nl RV fxn; c. 09/2021 cMRI:  EF 60%, no LVOT obstruction/gradient. Mild conc LVH, mild AS. No myocardial scar/fibrosis. No evidence of HCM.   Leg swelling    Left   Low back pain    Mixed ICM & NICM    a. 08/2017 LV gram: EF 45-50%, glob HK; b. 08/2021 Echo: EF 60-65%.   Morbid obesity ([place])    OSA on CPAP    non compliant   Pulmonary hypertension (HCC)     Tobacco Use: Social History   Tobacco Use  Smoking Status Never  Smokeless Tobacco Never    Labs: Review Flowsheet  More data exists      Latest Ref Rng & Units 02/15/2017 05/24/2017 08/23/2017 12/08/2017 09/07/2021  Labs for ITP Cardiac and Pulmonary Rehab  Cholestrol 0 - 200 mg/dL - - - 176  145   LDL (calc) 0 - 99 mg/dL - - - 103  79   HDL-C >40 mg/dL - - - 44  43    Trlycerides <150 mg/dL - - - 143  113   Hemoglobin A1c 4.8 - 5.6 % 8.1  8.1  C 6.6  6.9  7.5     Details      C Corrected result          Exercise Target Goals: Exercise Program Goal: Individual exercise prescription set using results from initial 6 min walk test and THRR while considering  patient's activity barriers and safety.   Exercise Prescription Goal: Initial exercise prescription builds to 30-45 minutes a day of aerobic activity, 2-3 days per week.  Home exercise guidelines will be given to patient during program as part of exercise prescription that the participant will acknowledge.   Education: Aerobic Exercise: - Group verbal and visual presentation on the components of exercise prescription. Introduces F.I.T.T principle from ACSM for exercise prescriptions.  Reviews F.I.T.T. principles of aerobic exercise including progression. Written material given at graduation.   Education: Resistance Exercise: - Group verbal and visual presentation on the components of exercise prescription. Introduces F.I.T.T principle from ACSM for exercise prescriptions  Reviews F.I.T.T. principles of resistance exercise including progression. Written material given at graduation.    Education: Exercise & Equipment Safety: - Individual verbal instruction and demonstration of equipment use and safety with use of the equipment. Flowsheet Row Cardiac Rehab from 12/11/2021 in [facility] Cardiac and Pulmonary Rehab  Date 10/21/21  Educator [facility]  Instruction Review Code 1- Verbalizes Understanding       Education: Exercise Physiology & General Exercise Guidelines: - Group verbal and written instruction with models to review the exercise physiology of the cardiovascular system and associated critical values. Provides general exercise guidelines with specific guidelines to those with heart or lung disease.  Flowsheet Row Cardiac Rehab from 12/11/2021 in [facility] Cardiac and Pulmonary Rehab  Date 11/13/21   Educator NT  Instruction Review Code 1- [employer] Understanding       Education: Flexibility, Balance, Mind/Body Relaxation: - Group verbal and visual presentation with interactive activity on the components of exercise prescription. Introduces F.I.T.T principle from ACSM for exercise prescriptions. Reviews F.I.T.T. principles of flexibility and balance exercise training including progression. Also discusses the mind body connection.  Reviews various relaxation techniques to help reduce and manage stress (i.e. Deep breathing, progressive muscle relaxation, and visualization). Balance handout provided to take home. Written material given at graduation.   Activity Barriers & Risk Stratification:  Activity Barriers & Cardiac Risk Stratification - 10/21/21 1028       Activity Barriers & Cardiac Risk Stratification  Activity Barriers Deconditioning;Muscular Weakness;Shortness of Breath;Balance Concerns;Neck/Spine Problems;Joint Problems   DDD in neck and spine, L shoulder limited   Cardiac Risk Stratification Moderate             6 Minute Walk:  6 Minute Walk     Row Name 10/21/21 1027         6 Minute Walk   Phase Initial     Distance 1060 feet     Walk Time 6 minutes     # of Rest Breaks 0     MPH 2     METS 2.16     RPE 14     Perceived Dyspnea  3     VO2 Peak 7.56     Symptoms Yes (comment)     Comments chest tightness 6/10, SOB     Resting HR 70 bpm     Resting BP 124/64     Resting Oxygen Saturation  98 %     Exercise Oxygen Saturation  during 6 min walk 100 %     Max Ex. HR 109 bpm     Max Ex. BP 128/64     2 Minute Post BP 114/66              Oxygen Initial Assessment:   Oxygen Re-Evaluation:   Oxygen Discharge (Final Oxygen Re-Evaluation):   Initial Exercise Prescription:  Initial Exercise Prescription - 10/21/21 1000       Date of Initial Exercise RX and Referring Provider   Date 10/21/21    Referring Provider [name] MD       Oxygen   Maintain Oxygen Saturation 88% or higher      Treadmill   MPH 1.8    Grade 0.5    Minutes 15    METs 2.52      REL-XR   Level 1    Speed 50    Minutes 15    METs 2.5      T5 Nustep   Level 2    SPM 80    Minutes 15    METs 2.5      Biostep-RELP   Level 2    SPM 50    Minutes 15    METs 2      Track   Laps 28    Minutes 15    METs 2.52      Prescription Details   Frequency (times per week) 2    Duration Progress to 30 minutes of continuous aerobic without signs/symptoms of physical distress      Intensity   THRR 40-80% of Max Heartrate 108-145    Ratings of Perceived Exertion 11-13    Perceived Dyspnea 0-4      Progression   Progression Continue to progress workloads to maintain intensity without signs/symptoms of physical distress.      Resistance Training   Training Prescription Yes    Weight 4 lb    Reps 10-15             Perform Capillary Blood Glucose checks as needed.  Exercise Prescription Changes:   Exercise Prescription Changes     Row Name 10/21/21 1000 11/03/21 1600 12/16/21 1400         Response to Exercise   Blood Pressure (Admit) 124/64 110/66 124/72     Blood Pressure (Exercise) 128/64 138/68 144/64     Blood Pressure (Exit) 114/66 108/68 126/70     Heart Rate (Admit) 70 bpm 72 bpm 85 bpm  Heart Rate (Exercise) 109 bpm 115 bpm 121 bpm     Heart Rate (Exit) 80 bpm 87 bpm 86 bpm     Oxygen Saturation (Admit) 98 % -- --     Oxygen Saturation (Exercise) 100 % -- --     Rating of Perceived Exertion (Exercise) 14 14 14      Perceived Dyspnea (Exercise) 3 -- --     Symptoms SOB, chest tightness 6/10 none none     Comments walk test results 3rd full day of exercise --     Duration -- Progress to 30 minutes of  aerobic without signs/symptoms of physical distress Continue with 30 min of aerobic exercise without signs/symptoms of physical distress.     Intensity -- THRR unchanged THRR unchanged       Progression    Progression -- Continue to progress workloads to maintain intensity without signs/symptoms of physical distress. Continue to progress workloads to maintain intensity without signs/symptoms of physical distress.     Average METs -- 2.19 2.61       Resistance Training   Training Prescription -- Yes Yes     Weight -- 4 lb 5 lb     Reps -- 10-15 10-15       Interval Training   Interval Training -- No No       Treadmill   MPH -- 1.8 --     Grade -- 0.5 --     Minutes -- 15 --     METs -- 2.5 --       Recumbant Bike   Level -- -- 4     Watts -- -- 63     Minutes -- -- 15     METs -- -- 2.52       NuStep   Level -- -- 4     Minutes -- -- 15     METs -- -- 3.3       REL-XR   Level -- 1 --     Minutes -- 15 --     METs -- 1.3 --       T5 Nustep   Level -- 2 --     Minutes -- 15 --     METs -- 2.4 --       Biostep-RELP   Level -- 2 4     Minutes -- 15 15     METs -- 2 2       Track   Laps -- -- 30     Minutes -- -- 15     METs -- -- 2.63       Oxygen   Maintain Oxygen Saturation -- 88% or higher 88% or higher              Exercise Comments:   Exercise Comments     Row Name 10/23/21 0800           Exercise Comments First full day of exercise!  Patient was oriented to gym and equipment including functions, settings, policies, and procedures.  Patient's individual exercise prescription and treatment plan were reviewed.  All starting workloads were established based on the results of the 6 minute walk test done at initial orientation visit.  The plan for exercise progression was also introduced and progression will be customized based on patient's performance and goals.                Exercise Goals and Review:   Exercise Goals  [place] Name 10/21/21 1031             Exercise Goals   Increase Physical Activity Yes       Intervention Provide advice, education, support and counseling about physical activity/exercise needs.;Develop an individualized  exercise prescription for aerobic and resistive training based on initial evaluation findings, risk stratification, comorbidities and participant's personal goals.       Expected Outcomes Long Term: Add in home exercise to make exercise part of routine and to increase amount of physical activity.;Short Term: Attend rehab on a regular basis to increase amount of physical activity.;Long Term: Exercising regularly at least 3-5 days a week.       Increase Strength and Stamina Yes       Intervention Provide advice, education, support and counseling about physical activity/exercise needs.;Develop an individualized exercise prescription for aerobic and resistive training based on initial evaluation findings, risk stratification, comorbidities and participant's personal goals.       Expected Outcomes Short Term: Increase workloads from initial exercise prescription for resistance, speed, and METs.;Short Term: Perform resistance training exercises routinely during rehab and add in resistance training at home;Long Term: Improve cardiorespiratory fitness, muscular endurance and strength as measured by increased METs and functional capacity (6MWT)       Able to understand and use rate of perceived exertion (RPE) scale Yes       Intervention Provide education and explanation on how to use RPE scale       Expected Outcomes Short Term: Able to use RPE daily in rehab to express subjective intensity level;Long Term:  Able to use RPE to guide intensity level when exercising independently       Able to understand and use Dyspnea scale Yes       Intervention Provide education and explanation on how to use Dyspnea scale       Expected Outcomes Short Term: Able to use Dyspnea scale daily in rehab to express subjective sense of shortness of breath during exertion;Long Term: Able to use Dyspnea scale to guide intensity level when exercising independently       Knowledge and understanding of Target Heart Rate Range (THRR) Yes        Intervention Provide education and explanation of THRR including how the numbers were predicted and where they are located for reference       Expected Outcomes Short Term: Able to state/look up THRR;Short Term: Able to use daily as guideline for intensity in rehab;Long Term: Able to use THRR to govern intensity when exercising independently       Able to check pulse independently Yes       Intervention Provide education and demonstration on how to check pulse in carotid and radial arteries.;Review the importance of being able to check your own pulse for safety during independent exercise       Expected Outcomes Short Term: Able to explain why pulse checking is important during independent exercise;Long Term: Able to check pulse independently and accurately       Understanding of Exercise Prescription Yes       Intervention Provide education, explanation, and written materials on patient's individual exercise prescription       Expected Outcomes Short Term: Able to explain program exercise prescription;Long Term: Able to explain home exercise prescription to exercise independently                Exercise Goals Re-Evaluation :  Exercise Goals Re-Evaluation     Row Name 10/23/21 0800 11/03/21  1619 11/13/21 0732 12/08/21 0725 12/16/21 1422     Exercise Goal Re-Evaluation   Exercise Goals Review Able to understand and use rate of perceived exertion (RPE) scale;Knowledge and understanding of Target Heart Rate Range (THRR);Understanding of Exercise Prescription;Increase Physical Activity;Increase Strength and Stamina;Able to understand and use Dyspnea scale;Able to check pulse independently Increase Physical Activity;Increase Strength and Stamina;Understanding of Exercise Prescription Increase Physical Activity;Increase Strength and Stamina;Understanding of Exercise Prescription;Able to understand and use rate of perceived exertion (RPE) scale;Able to understand and use Dyspnea scale;Knowledge and  understanding of Target Heart Rate Range (THRR);Able to check pulse independently Increase Physical Activity;Increase Strength and Stamina;Understanding of Exercise Prescription Increase Physical Activity;Increase Strength and Stamina;Understanding of Exercise Prescription   Comments Reviewed RPE and dyspnea scales, THR and program prescription with pt today.  Pt voiced understanding and was given a copy of goals to take home. Erma is doing well for the first couple of sessions that he has been here. He was able to follow his initial exercise prescription. He had a hypertensive BP response on his first day but was re-checked and back to normal range. We will continue to monitor as he progresses in the program. Reviewed home exercise with pt today.  Pt plans to walk and use staff videos at home for exercise.  Reviewed THR, pulse, RPE, sign and symptoms, pulse oximetery and when to call 911 or MD.  Also discussed weather considerations and indoor options.  Pt voiced understanding.  Kannan mentioned today that he is having chest tightness today like pressure on his chest.  It started early this morning.  He was encourged to call his doctor. Patient reports that he has incorporated some exercise at home on off days of rehab. He comes to rehab 2 times a week and has added 2-3 days of walking at home. He reported that he does want to start progressing at home and do more walking as he continues to get stronger. He has communicated with his doctor about his chest pressure and he is activly monitoring his symptoms. [place] has been doing well in rehab. He recently improved his overall average METs to 2.61 METs. He also increased from 28 to 30 laps on the track. Asa improved to level 4 on the Biostep as well. He also began using 5 lb hand weights for resistance training as well. We will continue to monitor his progress in the program.   Expected Outcomes Short: Use RPE daily to regulate intensity. Long: Follow program  prescription in THR. Short: Follow current exercise prescription Long: Increase overall strength and MET level Short: Call doctor about chest pressure Long: Conitnue to exercise independently Short: continue to monitor chest pressure and be in communication with his doctor about any changes or concerns. Continue to progresss with home exercise as tolerated without symptoms. Long: continue to exercise independently. Short: Continue to push for more laps on the track. Long: Continue to increase strength and stamina.            Discharge Exercise Prescription (Final Exercise Prescription Changes):  Exercise Prescription Changes - 12/16/21 1400       Response to Exercise   Blood Pressure (Admit) 124/72    Blood Pressure (Exercise) 144/64    Blood Pressure (Exit) 126/70    Heart Rate (Admit) 85 bpm    Heart Rate (Exercise) 121 bpm    Heart Rate (Exit) 86 bpm    Rating of Perceived Exertion (Exercise) 14    Symptoms none    Duration Continue  with 30 min of aerobic exercise without signs/symptoms of physical distress.    Intensity THRR unchanged      Progression   Progression Continue to progress workloads to maintain intensity without signs/symptoms of physical distress.    Average METs 2.61      Resistance Training   Training Prescription Yes    Weight 5 lb    Reps 10-15      Interval Training   Interval Training No      Recumbant Bike   Level 4    Watts 63    Minutes 15    METs 2.52      NuStep   Level 4    Minutes 15    METs 3.3      Biostep-RELP   Level 4    Minutes 15    METs 2      Track   Laps 30    Minutes 15    METs 2.63      Oxygen   Maintain Oxygen Saturation 88% or higher             Nutrition:  Target Goals: Understanding of nutrition guidelines, daily intake of sodium <1564m, cholesterol <2014m calories 30% from fat and 7% or less from saturated fats, daily to have 5 or more servings of fruits and vegetables.  Education: All About  Nutrition: -Group instruction provided by verbal, written material, interactive activities, discussions, models, and posters to present general guidelines for heart healthy nutrition including fat, fiber, MyPlate, the role of sodium in heart healthy nutrition, utilization of the nutrition label, and utilization of this knowledge for meal planning. Follow up email sent as well. Written material given at graduation. [facility]  Education need identified 10/21/21  Date 12/11/21  Educator [place] Code 1- Verbalizes Understanding       Biometrics:  Pre Biometrics - 10/21/21 1031       Pre Biometrics   Height 5' 9.5" (1.765 m)    Weight 329 lb 8 oz (149.5 kg)    BMI (Calculated) 47.98    Single Leg Stand 3.5 seconds              Nutrition Therapy Plan and Nutrition Goals:  Nutrition Therapy & Goals - 11/11/21 1329       Nutrition Therapy   Diet Heart Healthy, T2DM MNT    Drug/Food Interactions Statins/Certain Fruits    Protein (specify units) 80g    Fiber 30 grams    Whole Grain Foods 3 servings    Saturated Fats 16 max. grams    Fruits and Vegetables 8 servings/day    Sodium 2 grams      Personal Nutrition Goals   Nutrition Goal ST: swap out some grains with whole grains - try whole grain bread, brainstorm easy to prep meals  LT: Limit eating out <3x/week, follow MyPlate guidelines, limit Na <2g    Comments [age].o. M admitted to cardiac rehab s/p coronary artery stent placement. PMHx includes HTN, HLD, chronic systolic congestive heart failure, mixed ischemic and nonischemic cardiomyopathy, CAD, T2DM, GERD. Relevant medications include lipitor, cymbalta, pepcid, lasix, glipizide, jardiance, metformin, tramadol, trazodone. PYP Score: 53. Vegetables & Fruits 6/12. Breads, Grains & Cereals 6/12. Red & Processed Meat 8/12. Poultry 0/2. Fish & Shellfish 0/4. B: Bagel (cinnamon raisin - thomas's) with  cream cheese or toast (italian white bread with peanut butter) and sometimes bacon, egg and cheese biscuit -  with unsweetened tea or OJ S: granola bar with peanuts in it or some fruit L: sandwich ([place] with mayo and american cheese on white bread) or chicken sandwich (roll or bun) or sometimes pizza on the weekend or sometimes house salad ([place] or caesar). S: usually no snack, but if he does he will have sun chips (he likes the crunch factor). D: house salad, door dash (salads from [employer] with chicken, hamburger, hotdog, steak, pizza, spaghetti, [place] food, [place] (brown rice with order)).  S: sun chips sometimes. Drinks: orange soda with no added sugar or gaterade sometimes, not usually water. He will occasionally get heartburn. Discussed general heart healthy eating and diabetes friendly eating.      Intervention Plan   Intervention Prescribe, educate and counsel regarding individualized specific dietary modifications aiming towards targeted core components such as weight, hypertension, lipid management, diabetes, heart failure and other comorbidities.    Expected Outcomes Long Term Goal: Adherence to prescribed nutrition plan.;Short Term Goal: A plan has been developed with personal nutrition goals set during dietitian appointment.;Short Term Goal: Understand basic principles of dietary content, such as calories, fat, sodium, cholesterol and nutrients.             Nutrition Assessments:  MEDIFICTS Score Key: ?70 Need to make dietary changes  40-70 Heart Healthy Diet ? 40 Therapeutic Level Cholesterol Diet  Flowsheet Row Cardiac Rehab from 10/21/2021 in [facility] Cardiac and Pulmonary Rehab  Picture Your Plate Total Score on Admission 53      Picture Your Plate Scores: <62 Unhealthy dietary pattern with much room for improvement. 41-50 Dietary pattern unlikely to meet recommendations for good health and room for improvement. 51-60 More healthful dietary pattern, with some room  for improvement.  >60 Healthy dietary pattern, although there may be some specific behaviors that could be improved.    Nutrition Goals Re-Evaluation:  Nutrition Goals Re-Evaluation     [place] Name 12/08/21 0735             Goals   Nutrition Goal ST: swap out some grains with whole grains - try whole grain bread, brainstorm easy to prep meals  LT: Limit eating out <3x/week, follow MyPlate guidelines, limit Na <2g       Comment Patient reports that he has been trying to switch from refined grains to whole grains, and has been reducing the amount of sodium consumption. He is incorporating more fruits and vegetables into his diet and drinking more water instead of other drinks. He still needs to work on brainstorming for easy meal prep ideas.       Expected Outcome Short: work on finding easy meal prep ideas. Long: help control cardiac risk factors with heart healthy eating.                Nutrition Goals Discharge (Final Nutrition Goals Re-Evaluation):  Nutrition Goals Re-Evaluation - 12/08/21 0735       Goals   Nutrition Goal ST: swap out some grains with whole grains - try whole grain bread, brainstorm easy to prep meals  LT: Limit eating out <3x/week, follow MyPlate guidelines, limit Na <2g    Comment Patient reports that he has been trying to switch from refined grains to whole grains, and has been reducing the amount of sodium consumption. He is incorporating more fruits and vegetables into his diet and drinking more water instead of other drinks. He still needs to work on brainstorming for easy meal prep ideas.    Expected Outcome  Short: work on finding easy meal prep ideas. Long: help control cardiac risk factors with heart healthy eating.             Psychosocial: Target Goals: Acknowledge presence or absence of significant depression and/or stress, maximize coping skills, provide positive support system. Participant is able to verbalize types and ability to use techniques  and skills needed for reducing stress and depression.   Education: Stress, Anxiety, and Depression - Group verbal and visual presentation to define topics covered.  Reviews how body is impacted by stress, anxiety, and depression.  Also discusses healthy ways to reduce stress and to treat/manage anxiety and depression.  Written material given at graduation. Flowsheet Row Cardiac Rehab from 12/11/2021 in [facility] Cardiac and Pulmonary Rehab  Date 11/06/21  Educator [facility]  Instruction Review Code 1- [employer] Understanding       Education: Sleep Hygiene -Provides group verbal and written instruction about how sleep can affect your health.  Define sleep hygiene, discuss sleep cycles and impact of sleep habits. Review good sleep hygiene tips.    Initial Review & Psychosocial Screening:  Initial Psych Review & Screening - 10/08/21 1343       Initial Review   Current issues with None Identified      Family Dynamics   Good Support System? Yes   Wife,friends, church friends     Barriers   Psychosocial barriers to participate in program There are no identifiable barriers or psychosocial needs.      Screening Interventions   Interventions Encouraged to exercise;To provide support and resources with identified psychosocial needs;Provide feedback about the scores to participant    Expected Outcomes Short Term goal: Utilizing psychosocial counselor, staff and physician to assist with identification of specific Stressors or current issues interfering with healing process. Setting desired goal for each stressor or current issue identified.;Long Term Goal: Stressors or current issues are controlled or eliminated.;Short Term goal: Identification and review with participant of any Quality of Life or Depression concerns found by scoring the questionnaire.;Long Term goal: The participant improves quality of Life and PHQ9 Scores as seen by post scores and/or verbalization of changes             Quality of  Life Scores:   Quality of Life - 10/21/21 1032       Quality of Life   Select Quality of Life      Quality of Life Scores   Health/Function Pre 12.2 %    Socioeconomic Pre 22.93 %    Psych/Spiritual Pre 16.29 %    Family Pre 22.5 %    GLOBAL Pre 16.59 %            Scores of 19 and below usually indicate a poorer quality of life in these areas.  A difference of  2-3 points is a clinically meaningful difference.  A difference of 2-3 points in the total score of the Quality of Life Index has been associated with significant improvement in overall quality of life, self-image, physical symptoms, and general health in studies assessing change in quality of life.  PHQ-9: Review Flowsheet  More data exists      12/08/2021 11/13/2021 10/21/2021 12/08/2017 08/23/2017  Depression screen PHQ 2/9  Decreased Interest 0 1 2 0 0  Down, Depressed, Hopeless 1 0 0 0 0  PHQ - 2 Score 1 1 2  0 0  Altered sleeping 2 2 2  - -  Tired, decreased energy 2 2 3  - -  Change in appetite  0 1 2 - -  Feeling bad or failure about yourself  0 0 0 - -  Trouble concentrating 1 0 0 - -  Moving slowly or fidgety/restless 1 0 1 - -  Suicidal thoughts 0 0 0 - -  PHQ-9 Score 7 6 10  - -  Difficult doing work/chores Somewhat difficult Somewhat difficult Somewhat difficult - -   Interpretation of Total Score  Total Score Depression Severity:  1-4 = Minimal depression, 5-9 = Mild depression, 10-14 = Moderate depression, 15-19 = Moderately severe depression, 20-27 = Severe depression   Psychosocial Evaluation and Intervention:  Psychosocial Evaluation - 10/08/21 1401       Psychosocial Evaluation & Interventions   Interventions Encouraged to exercise with the program and follow exercise prescription    Comments Kamarius has no barriers to attending the program. He lives with his wife,. His wife, friends and church friends are his support team. Mike is working on weight loss. He has lost over 100 lbs and is continuing the  weight loss journey. He wants to learn how to manage an exercise program and to continue with his weight loss. HIs long term goal is to reduce the anmount of medicine he is taking.    Expected Outcomes STG Leonidas is able to attend all scheduled sessions, he works with EP and RD to continue his weight loss journey, and he manages to progress to a routine exercise program LTG Darrold continues to progress with exercise and weight loss after discharge. He is able to in the longterm to decrease his medication needs    Continue Psychosocial Services  Follow up required by staff             Psychosocial Re-Evaluation:  Psychosocial Re-Evaluation     [place] Name 11/13/21 0735 12/08/21 0732 12/08/21 0738         Psychosocial Re-Evaluation   Current issues with Current Stress Concerns;Current Sleep Concerns Current Stress Concerns;Current Sleep Concerns Current Stress Concerns;Current Sleep Concerns     Comments Drelyn's PHQ has improved to 6 down from 10.  He is still having difficulty with sleep and energy levels.  He is also having chest tightness today.  He was encouraged to talk to the doctor about chest tightness.  He has not had a recent sleep study but will see a pulmonologist in August and wants to get a new CPAP mask as his does not stay on very well.  Thus his CPAP compliance is not the greatest as it usually falls off during the night.  These are his biggest sources of stressors.  He has noticed the bruising popping up from his blood thinner that sometimes catches him off guard. -- Jamorris's PHQ follow up was 7. This was slightly higher then last time but still lower then when he started. He does feel like he is doing better mentally then when he started the program. He still reports some sleep trouble but nothing new and has an appointment with the company to talk about his new CPAP machine scheduled for august. He has talked to his doctor about his chest tightness and  he is monitoring it and in  communication with his doctor about that.     Expected Outcomes Short: Talk to doctor about chest tightness Long: Continue to work on sleep -- Short: have appointment with CPAP company to get new machine. Long: Maintain godd mental health habits.     Interventions Encouraged to attend Cardiac Rehabilitation for the exercise -- Encouraged to  attend Cardiac Rehabilitation for the exercise     Continue Psychosocial Services  Follow up required by staff -- Follow up required by staff              Psychosocial Discharge (Final Psychosocial Re-Evaluation):  Psychosocial Re-Evaluation - 12/08/21 0738       Psychosocial Re-Evaluation   Current issues with Current Stress Concerns;Current Sleep Concerns    Comments Chrystopher's PHQ follow up was 7. This was slightly higher then last time but still lower then when he started. He does feel like he is doing better mentally then when he started the program. He still reports some sleep trouble but nothing new and has an appointment with the company to talk about his new CPAP machine scheduled for august. He has talked to his doctor about his chest tightness and  he is monitoring it and in communication with his doctor about that.    Expected Outcomes Short: have appointment with CPAP company to get new machine. Long: Maintain godd mental health habits.    Interventions Encouraged to attend Cardiac Rehabilitation for the exercise    Continue Psychosocial Services  Follow up required by staff             Vocational Rehabilitation: Provide vocational rehab assistance to qualifying candidates.   Vocational Rehab Evaluation & Intervention:   Education: Education Goals: Education classes will be provided on a variety of topics geared toward better understanding of heart health and risk factor modification. Participant will state understanding/return demonstration of topics presented as noted by education test scores.  Learning  Barriers/Preferences:   General Cardiac Education Topics:  AED/CPR: - Group verbal and written instruction with the use of models to demonstrate the basic use of the AED with the basic ABC's of resuscitation.   Anatomy and Cardiac Procedures: - Group verbal and visual presentation and models provide information about basic cardiac anatomy and function. Reviews the testing methods done to diagnose heart disease and the outcomes of the test results. Describes the treatment choices: Medical Management, Angioplasty, or Coronary Bypass Surgery for treating various heart conditions including Myocardial Infarction, Angina, Valve Disease, and Cardiac Arrhythmias.  Written material given at graduation. Flowsheet Row Cardiac Rehab from 12/11/2021 in [facility] Cardiac and Pulmonary Rehab  Education need identified 10/21/21  Date 11/27/21  Educator SB  Instruction Review Code 1- Verbalizes Understanding       Medication Safety: - Group verbal and visual instruction to review commonly prescribed medications for heart and lung disease. Reviews the medication, class of the drug, and side effects. Includes the steps to properly store meds and maintain the prescription regimen.  Written material given at graduation.   Intimacy: - Group verbal instruction through game format to discuss how heart and lung disease can affect sexual intimacy. Written material given at graduation..   Know Your Numbers and Heart Failure: - Group verbal and visual instruction to discuss disease risk factors for cardiac and pulmonary disease and treatment options.  Reviews associated critical values for Overweight/Obesity, Hypertension, Cholesterol, and Diabetes.  Discusses basics of heart failure: signs/symptoms and treatments.  Introduces Heart Failure Zone chart for action plan for heart failure.  Written material given at graduation.   Infection Prevention: - Provides verbal and written material to individual with discussion  of infection control including proper hand washing and proper equipment cleaning during exercise session. Flowsheet Row Cardiac Rehab from 12/11/2021 in [facility] Cardiac and Pulmonary Rehab  Date 10/21/21  Educator [facility]  Instruction Review Code 1-  Verbalizes Understanding       Falls Prevention: - Provides verbal and written material to individual with discussion of falls prevention and safety. Flowsheet Row Cardiac Rehab from 12/11/2021 in [facility] Cardiac and Pulmonary Rehab  Date 10/08/21  Educator SB  Instruction Review Code 1- Verbalizes Understanding       Other: -Provides group and verbal instruction on various topics (see comments)   Knowledge Questionnaire Score:  Knowledge Questionnaire Score - 10/21/21 1033       Knowledge Questionnaire Score   Pre Score 24/26             Core Components/Risk Factors/Patient Goals at Admission:  Personal Goals and Risk Factors at Admission - 10/21/21 1033       Core Components/Risk Factors/Patient Goals on Admission    Weight Management Yes;Obesity;Weight Loss    Intervention Weight Management: Develop a combined nutrition and exercise program designed to reach desired caloric intake, while maintaining appropriate intake of nutrient and fiber, sodium and fats, and appropriate energy expenditure required for the weight goal.;Weight Management/Obesity: Establish reasonable short term and long term weight goals.;Obesity: Provide education and appropriate resources to help participant work on and attain dietary goals.;Weight Management: Provide education and appropriate resources to help participant work on and attain dietary goals.    Admit Weight 329 lb 8 oz (149.5 kg)    Goal Weight: Short Term 324 lb (147 kg)    Goal Weight: Long Term 225 lb (102.1 kg)    Expected Outcomes Short Term: Continue to assess and modify interventions until short term weight is achieved;Long Term: Adherence to nutrition and physical activity/exercise program  aimed toward attainment of established weight goal;Weight Loss: Understanding of general recommendations for a balanced deficit meal plan, which promotes 1-2 lb weight loss per week and includes a negative energy balance of [phone] kcal/d;Understanding recommendations for meals to include 15-35% energy as protein, 25-35% energy from fat, 35-60% energy from carbohydrates, less than 290m of dietary cholesterol, 20-35 gm of total fiber daily;Understanding of distribution of calorie intake throughout the day with the consumption of 4-5 meals/snacks    Diabetes Yes    Intervention Provide education about signs/symptoms and action to take for hypo/hyperglycemia.;Provide education about proper nutrition, including hydration, and aerobic/resistive exercise prescription along with prescribed medications to achieve blood glucose in normal ranges: Fasting glucose 65-99 mg/dL    Expected Outcomes Short Term: Participant verbalizes understanding of the signs/symptoms and immediate care of hyper/hypoglycemia, proper foot care and importance of medication, aerobic/resistive exercise and nutrition plan for blood glucose control.;Long Term: Attainment of HbA1C < 7%.    Heart Failure Yes    Intervention Provide a combined exercise and nutrition program that is supplemented with education, support and counseling about heart failure. Directed toward relieving symptoms such as shortness of breath, decreased exercise tolerance, and extremity edema.    Expected Outcomes Short term: Attendance in program 2-3 days a week with increased exercise capacity. Reported lower sodium intake. Reported increased fruit and vegetable intake. Reports medication compliance.;Improve functional capacity of life;Short term: Daily weights obtained and reported for increase. Utilizing diuretic protocols set by physician.;Long term: Adoption of self-care skills and reduction of barriers for early signs and symptoms recognition and intervention leading  to self-care maintenance.    Hypertension Yes    Intervention Provide education on lifestyle modifcations including regular physical activity/exercise, weight management, moderate sodium restriction and increased consumption of fresh fruit, vegetables, and low fat dairy, alcohol moderation, and smoking cessation.;Monitor prescription use compliance.  Expected Outcomes Short Term: Continued assessment and intervention until BP is < 140/73m HG in hypertensive participants. < 130/843mHG in hypertensive participants with diabetes, heart failure or chronic kidney disease.;Long Term: Maintenance of blood pressure at goal levels.    Lipids Yes    Intervention Provide education and support for participant on nutrition & aerobic/resistive exercise along with prescribed medications to achieve LDL <7042mHDL >52m70m  Expected Outcomes Short Term: Participant states understanding of desired cholesterol values and is compliant with medications prescribed. Participant is following exercise prescription and nutrition guidelines.;Long Term: Cholesterol controlled with medications as prescribed, with individualized exercise RX and with personalized nutrition plan. Value goals: LDL < 70mg70mL > 40 mg.             Education:Diabetes - Individual verbal and written instruction to review signs/symptoms of diabetes, desired ranges of glucose level fasting, after meals and with exercise. Acknowledge that pre and post exercise glucose checks will be done for 3 sessions at entry of program. [place] [facility]  Date 10/21/21  Educator JH  [facility]ruction Review Code 1- Verbalizes Understanding       Core Components/Risk Factors/Patient Goals Review:   Goals and Risk Factor Review     Row Name 11/13/21 0741 12/08/21 0727           Core Components/Risk Factors/Patient Goals Review   Personal Goals Review Weight  Management/Obesity;Hypertension;Diabetes;Lipids Weight Management/Obesity;Hypertension;Diabetes;Lipids      Review ShawnJlenoing well in rehab.  His weight has crept up some to 333 lb today.  He is not eating more and just met with dietitian this week.  We also reviewed home exercise today, so adding that it will help with calorie burn.  His pressures are doing well and checks them at home with a wrist monitor.  He also checks his sugars reguarly at home, today they were 140 and stay around that range.  He has been having chest pain and was encouraged to stop by office to talk to nurse or doctor. Patient reports taking all medication as prescribed. He monitors his blood glucose and blood pressure at home and reports them to be in acceptable ranges. His weight as had ups and downs but her reports that he does not have fluid retention problems.      Expected Outcomes Short: Continue to work on weight loss Long: Continue to monitor risk factors Short: Continue to work on weight loss Long: Continue to monitor risk factors               Core Components/Risk Factors/Patient Goals at Discharge (Final Review):   Goals and Risk Factor Review - 12/08/21 0727       Core Components/Risk Factors/Patient Goals Review   Personal Goals Review Weight Management/Obesity;Hypertension;Diabetes;Lipids    Review Patient reports taking all medication as prescribed. He monitors his blood glucose and blood pressure at home and reports them to be in acceptable ranges. His weight as had ups and downs but her reports that he does not have fluid retention problems.    Expected Outcomes Short: Continue to work on weight loss Long: Continue to monitor risk factors             ITP Comments:  ITP Comments     Row Name 10/08/21 1401 10/21/21 1027 10/23/21 0800 10/29/21 1258 11/11/21 1422   ITP Comments Virtual orientation call completed today. he has an appointment on Date: 10/21/2021  for  EP eval and gym Orientation.   Documentation of diagnosis can be found in [facility] Date: 09/04/2021 . Completed 6MWT and gym orientation. Initial ITP created and sent for review to Dr. Emily Filbert, Medical Director. First full day of exercise!  Patient was oriented to gym and equipment including functions, settings, policies, and procedures.  Patient's individual exercise prescription and treatment plan were reviewed.  All starting workloads were established based on the results of the 6 minute walk test done at initial orientation visit.  The plan for exercise progression was also introduced and progression will be customized based on patient's performance and goals. 30 Day review completed. Medical Director ITP review done, changes made as directed, and signed approval by Medical Director. Completed initial RD consultation    [place] Name 11/26/21 0942 12/24/21 0743         ITP Comments 30 Day review completed. Medical Director ITP review done, changes made as directed, and signed approval by Medical Director. 30 Day review completed. Medical Director ITP review done, changes made as directed, and signed approval by Medical Director.               Comments:

## 2021-12-25 ENCOUNTER — Encounter: Payer: BLUE CROSS/BLUE SHIELD | Attending: Cardiovascular Disease

## 2021-12-25 DIAGNOSIS — Z955 Presence of coronary angioplasty implant and graft: Secondary | ICD-10-CM | POA: Insufficient documentation

## 2021-12-29 ENCOUNTER — Encounter: Payer: BLUE CROSS/BLUE SHIELD | Admitting: *Deleted

## 2021-12-29 DIAGNOSIS — Z955 Presence of coronary angioplasty implant and graft: Secondary | ICD-10-CM

## 2021-12-29 NOTE — Progress Notes (Signed)
Daily Session Note  Patient Details  Name: KAIYAN LUCZAK MRN: [id number] Date of Birth: [birth year] Referring Provider:   Flowsheet Row Cardiac Rehab from 10/21/2021 in [facility] Cardiac and Pulmonary Rehab  Referring Provider [name] MD       Encounter Date: 12/29/2021  Check In:  Session Check In - 12/29/21 0806       Check-In   Supervising physician immediately available to respond to emergencies See telemetry face sheet for immediately available ER MD    Location [facility]-Cardiac & Pulmonary Rehab    Staff Present Heath Lark, RN, BSN, CCRP;Jessica Tuluksak, MA, RCEP, CCRP, CCET;Laureen Stafford, BS, RRT, CPFT    Virtual Visit No    Medication changes reported     No    Fall or balance concerns reported    No    Warm-up and Cool-down Performed on first and last piece of equipment    Resistance Training Performed Yes    VAD Patient? No    PAD/SET Patient? No      Pain Assessment   Currently in Pain? No/denies                Social History   Tobacco Use  Smoking Status Never  Smokeless Tobacco Never    Goals Met:  Independence with exercise equipment Exercise tolerated well No report of concerns or symptoms today  Goals Unmet:  Not Applicable  Comments: Pt able to follow exercise prescription today without complaint.  Will continue to monitor for progression.    Dr. Emily Filbert is Medical Director for [place].  Dr. Ottie Glazier is Medical Director for [facility] Pulmonary Rehabilitation.

## 2022-01-01 ENCOUNTER — Telehealth: Payer: Self-pay

## 2022-01-01 NOTE — Telephone Encounter (Signed)
Lm for patient to request that he brings SD card to 01/02/2022 visit.

## 2022-01-01 NOTE — Telephone Encounter (Signed)
Noted.  Will close encounter.  

## 2022-01-02 ENCOUNTER — Ambulatory Visit: Payer: BLUE CROSS/BLUE SHIELD | Admitting: Primary Care

## 2022-01-02 ENCOUNTER — Encounter: Payer: Self-pay | Admitting: Primary Care

## 2022-01-02 VITALS — BP 118/74 | HR 84 | Temp 97.9°F | Ht 68.0 in | Wt 340.0 lb

## 2022-01-02 DIAGNOSIS — R0609 Other forms of dyspnea: Secondary | ICD-10-CM

## 2022-01-02 DIAGNOSIS — G4733 Obstructive sleep apnea (adult) (pediatric): Secondary | ICD-10-CM

## 2022-01-02 DIAGNOSIS — R197 Diarrhea, unspecified: Secondary | ICD-10-CM | POA: Diagnosis not present

## 2022-01-02 DIAGNOSIS — Z9989 Dependence on other enabling machines and devices: Secondary | ICD-10-CM | POA: Diagnosis not present

## 2022-01-02 DIAGNOSIS — Z1331 Encounter for screening for depression: Secondary | ICD-10-CM | POA: Diagnosis not present

## 2022-01-02 DIAGNOSIS — E7800 Pure hypercholesterolemia, unspecified: Secondary | ICD-10-CM | POA: Diagnosis not present

## 2022-01-02 DIAGNOSIS — Z125 Encounter for screening for malignant neoplasm of prostate: Secondary | ICD-10-CM | POA: Diagnosis not present

## 2022-01-02 DIAGNOSIS — Z0000 Encounter for general adult medical examination without abnormal findings: Secondary | ICD-10-CM | POA: Diagnosis not present

## 2022-01-02 DIAGNOSIS — Z23 Encounter for immunization: Secondary | ICD-10-CM | POA: Diagnosis not present

## 2022-01-02 DIAGNOSIS — E1142 Type 2 diabetes mellitus with diabetic polyneuropathy: Secondary | ICD-10-CM | POA: Diagnosis not present

## 2022-01-02 DIAGNOSIS — Z1330 Encounter for screening examination for mental health and behavioral disorders, unspecified: Secondary | ICD-10-CM | POA: Diagnosis not present

## 2022-01-02 MED ORDER — FLUTICASONE-SALMETEROL 250-50 MCG/ACT IN AEPB: 1.0000 | INHALATION_SPRAY | Freq: Two times a day (BID) | RESPIRATORY_TRACT | 0 refills | Status: DC

## 2022-01-02 NOTE — Patient Instructions (Addendum)
Recommendations:  We will repeat sleep study to re-assess sleep apnea, if still present will recommend we resume CPAP use  Trial Advair inhaler - take one puff morning and evening (rinse mouth after use) - Notify provider if you develop rash, muscle pain, increased heart rate/blood pressure, thrush, headache, worsening respiratory symptoms   Orders: Home sleep test  Follow-up: 2-4 weeks virtual visit with Beth NP to assess response to new inhaler    Fluticasone; Salmeterol Powder Inhaler What is this medication? FLUTICASONE; SALMETEROL (floo TIK a sone; sal ME te role) treats asthma and chronic obstructive pulmonary disease (COPD). It works by opening the airways of the lungs, making it easier to breathe. It is a combination of an inhaled steroid and a bronchodilator. It is often called a controller inhaler. Do not use it to treat a sudden asthma attack. This medicine may be used for other purposes; ask your health care provider or pharmacist if you have questions. COMMON BRAND NAME(S): Advair, AirDuo Digihaler, Airduo RespiClick What should I tell my care team before I take this medication? They need to know if you have any of these conditions: Diabetes (high blood sugar) Eye disease, such as glaucoma, cataracts, or blurred vision Heart disease High blood pressure Immune system problems Infections, such as tuberculosis (TB) or other bacterial, fungal, or viral infections Irregular heartbeat or rhythm Liver disease Osteoporosis, weak bones Seizures Taking other steroids like dexamethasone or prednisone Thyroid disease An unusual or allergic reaction to fluticasone, salmeterol, other corticosteroids, other medications, foods, dyes, or preservatives Pregnant or trying to get pregnant Breast-feeding How should I use this medication? This medication is inhaled through the mouth. Rinse your mouth with water after use. Make sure not to swallow the water. Take it as directed on the  prescription label at the same time every day. Do not use it more often than directed. This medication comes with INSTRUCTIONS FOR USE. Ask your pharmacist for directions on how to use this medication. Read the information carefully. Talk to your pharmacist or care team if you have questions. Talk to your care team about the use of this medication in children. While it may be prescribed for children as young as 4 years for selected conditions, precautions do apply. Overdosage: If you think you have taken too much of this medicine contact a poison control center or emergency room at once. NOTE: This medicine is only for you. Do not share this medicine with others. What if I miss a dose? If you miss a dose, use it as soon as you can. If it is almost time for your next dose, use only that dose. Do not use double or extra doses. What may interact with this medication? Do not take this medication with any of the following: MAOIs like Carbex, Eldepryl, Marplan, Nardil, and Parnate This medication may also interact with the following: Aminophylline or theophylline Antiviral medications for HIV or AIDS Beta-blockers like metoprolol and propranolol Certain antibiotics like clarithromycin, erythromycin, levofloxacin, linezolid, and telithromycin Certain medications for fungal infections like ketoconazole, itraconazole, posaconazole, voriconazole Conivaptan Diuretics Medications for colds Medications for depression or emotional conditions Nefazodone Vaccines This list may not describe all possible interactions. Give your health care provider a list of all the medicines, herbs, non-prescription drugs, or dietary supplements you use. Also tell them if you smoke, drink alcohol, or use illegal drugs. Some items may interact with your medicine. What should I watch for while using this medication? Visit your care team for regular checks on your  progress. Tell your care team if your symptoms do not start to  get better or if they get worse. Talk to your care team about how to treat an acute asthma attack or bronchospasm (wheezing). Be sure to always have a short-acting inhaler with you. If you use your short-acting inhaler and your symptoms do not get better or if they get worse, call your care team right away. If you have asthma, you and your care team should develop an Asthma Action Plan that is just for you. Be sure to know what to do if you are in the yellow (asthma is getting worse) or red (medical alert) zones. This medication can worsen breathing or cause wheezing right after you use it. Be sure you have a short-acting inhaler for acute attacks (wheezing) nearby. If this happens, stop using this medication right away and call your care team. This medication may increase your risk of dying from asthma-related problems. Talk to your care team if you have questions. This medication may increase your risk of getting an infection. Call your care team for advice if you get a fever, chills, sore throat, or other symptoms of a cold or flu. Do not treat yourself. Try to avoid being around people who are sick. If you have not had the measles or chickenpox vaccines, tell your care team right away if you are around someone with these viruses. This medication may slow your child's growth if it is taken for a long time at high doses. Your care team will monitor your child's growth. Using this medication for a long time may weaken your bones. The risk of bone fractures may be increased. Talk to your care team about your bone health. This medication may increase blood sugar. Ask your care team if changes in diet or medications are needed if you have diabetes. Do not treat yourself for coughs, colds or allergies without asking your care team for advice. Some nonprescription medications can affect this one. What side effects may I notice from receiving this medication? Side effects that you should report to your care  team as soon as possible: Allergic reactions--skin rash, itching, hives, swelling of the face, lips, tongue, or throat Flu-like symptoms--fever, chills, muscle pain, cough, headache, fatigue Heart rhythm changes--fast or irregular heartbeat, dizziness, feeling faint or lightheaded, chest pain, trouble breathing Increase in blood pressure Low adrenal gland function--nausea, vomiting, loss of appetite, unusual weakness or fatigue, dizziness Muscle pain or cramps Pain, tingling, or numbness in the hands or feet Sinus pain or pressure around the face or forehead Thrush--white patches in the mouth Wheezing or trouble breathing that is worse after use Side effects that usually do not require medical attention (report to your care team if they continue or are bothersome): Change in taste Cough Dry mouth Headache Hoarseness Sore throat Tremors or shaking Trouble sleeping This list may not describe all possible side effects. Call your doctor for medical advice about side effects. You may report side effects to FDA at 1-800-FDA-1088. Where should I keep my medication? Keep out of the reach of children and pets. Store at room temperature between 20 and 25 degrees C (68 and 77 degrees F). Keep inhaler away from extreme heat or humidity. Get rid of it 1 month after removing it from the foil pouch, when the dose counter reads "0" or after the expiration date, whichever is first. NOTE: This sheet is a summary. It may not cover all possible information. If you have questions about this medicine,  talk to your doctor, pharmacist, or health care provider.  2023 Elsevier/Gold Standard (2020-07-07 00:00:00)

## 2022-01-02 NOTE — Progress Notes (Signed)
@Patient  ID: Brett Marshall, male    DOB: 09-Sep-1965, 56 y.o.   MRN: [id number]  Chief Complaint  Patient presents with   Follow-up    SOB with exertion. Not wearing cpap.     Referring provider: Wilder Glade*  HPI: 56 year old male, never smoked.  Past medical history significant for hypertension, chronic systolic congestive heart failure, pulmonary hypertension, OSA on CPAP, GERD, NASH, type 2 diabetes, morbid obesity.  Former patient of Dr. Ashby Dawes for initial consult on 12/30/2017 for excessive daytime sleepiness.  HST 12/30/2017 showed moderate obstructive sleep apnea, AHI 20/hr with SPO2 low 78%. Patient had old CPAP machine and wanted to use this, ResMed series 8.  DME not able to accommodate settings.  Patient applied for CPAP assistance program.   Previous [facility] pulmonary encounter: 09/11/2020 Patient presents today for overdue follow-up OSA. Fromer Dr. Ashby Dawes patient. He has been off cpap for 6-8 months d/t mask fit issues. No recent download, pressure 5-20cm h20. Feels he is not getting enough pressure at times. He wears a full face mask, lays on his side which causes mask to slide off. He ordered supplies through [employer]. He also reports chest tightness with exertion or at rest for > 1 year. Associated dyspnea on exertion. No significant cough or wheezing. Associated post nasal drip. Works as Dance movement psychotherapist for [employer]. He has three cats and dog at home. Some exposure to black mold. He has lost 100 lbs in the last 2 years.   OSA on CPAP - Stopped wearing CPAP several months ago d.t mask issues  - Plan adjust CPAP pressure to 10-20cm h20 and recommend he look into getting CPAP pillow for side sleepers, if this does not work may need cpap mask desensitization study in labs  - Advised he aim to wear CPAP every night for min 4-6 hours or longer  Dyspnea - Patient reports dyspnea and chest tightness with exertion > 1 year. No significant cough or wheezing. He has  three cats and a dog at home and possible exposure to black mold.  - Needs CXR today and PFTs to further evaluated  - Sending in Albuterol rescue inhaler to use as needed q 6 hours for sob/wheezng  - FU in 4-8 weeks or sooner if needed/ may consider allergy panel   01/02/2022 Patient presents today for OV follow-up for OSA and DOE. He was last seen in April 2022. He continues to have persistent symptoms of dyspnea with exertion. He has a history of moderate obstructive sleep apnea but is not currently wearing CPAP. Home sleep test in August 2019, AHI 20/hr with SpO2 low 78%. He is a side or stomach sleeping. Mask was interfering with his sleep and tended to move at night. He last used CPAP over a year ago.  He had a recent normal CXR in May 2023. CTA imaging in May showed no airspace disease, effusion or pneumothorax. Central airways are patent. Pulmonary function testing in May 2022 was normal. No evidence of obstructive or restrictive lung disease.  Echocardiogram in April 2023 showed normal EF, grade 1 DD. He had a heart cath in April 2023 that showed severe one vessel coronary artery disease with 95% LAD stenosis. Ejection fraction 55-65% by visual estimate. Normal LV systolic function and mildly elevated left ventricular end diastolic pressure. Successful PCI and drug eluting sent placement to the ostial LAD. He had a second heart cath in May 2023 that showed mild-moderate, nonobstructive CAD similar to previous. Patent ostial/proximal LAD stent.  He has dyspnea and chest tightness when walking on treadmill or long distances. He has some morning congestion, mainly post nasal drip but occasionally he will cough up some phlegm. Albuterol does help when he uses it. He tells me that he had asthma as a child. He has never been on maintenance inhaler. He is attending pulmonary rehab. Denies wheezing or cough.    No Known Allergies  Immunization History  Administered Date(s) Administered    Influenza,inj,Quad PF,6+ Mos 02/21/2019, 04/01/2020   Influenza-Unspecified 08/04/2018   PFIZER Comirnaty(Gray Top)Covid-19 Tri-Sucrose Vaccine 04/01/2020   PFIZER(Purple Top)SARS-COV-2 Vaccination 08/08/2019, 08/29/2019   Pneumococcal Polysaccharide-23 02/21/2019   Tdap 01/20/2011   Zoster Recombinat (Shingrix) 02/21/2019, 05/30/2019    Past Medical History:  Diagnosis Date   Asthma    CAD (coronary artery disease)    a. 08/2017 Cath: Diff nonobs dzs, EF 45-50%, diff HK; b. 08/2021 MV: Ant/apical ishemia; c. 08/2021 PCI: LAD 95ost (4.0x15 Onyx Frontier DES), EF 55-65%; c. 09/2021 Cath: LM nl, LAD patent stent, 65m 30d, RI min irregs, LCX min irregs, RCA 50p. LVOT grad 50-813mg (rest), 70-11053m (provoked).   DDD (degenerative disc disease), lumbar    Depression    Diabetes mellitus without complication ([facility]  Essential hypertension, benign    HFimpEF (heart failure with improved ejection fraction) ([facility]  a. 08/2017 LV gram: EF 45-50%, glob HK; b. 08/2021 Echo: EF 60-65%, no rwma, GrI DD, nl RV fxn; c. 09/2021 cMRI: EF 60%, no LVOT obstruction/gradient. Mild conc LVH, mild AS. No myocardial scar/fibrosis. No evidence of HCM.   Leg swelling    Left   Low back pain    Mixed ICM & NICM    a. 08/2017 LV gram: EF 45-50%, glob HK; b. 08/2021 Echo: EF 60-65%.   Morbid obesity (HCC)    OSA on CPAP    non compliant   Pulmonary hypertension (HCC)     Tobacco History: Social History   Tobacco Use  Smoking Status Never  Smokeless Tobacco Never   Counseling given: Not Answered   Outpatient Medications Prior to Visit  Medication Sig Dispense Refill   albuterol (VENTOLIN HFA) 108 (90 Base) MCG/ACT inhaler Inhale 1-2 puffs into the lungs every 6 (six) hours as needed for wheezing or shortness of breath. 18 g 1   aspirin EC 81 MG tablet Take 1 tablet (81 mg total) by mouth daily. Swallow whole. 30 tablet 6   atorvastatin (LIPITOR) 40 MG tablet Take 1 tablet (40 mg total) by mouth daily at 6  PM. 30 tablet 2   BRILINTA 90 MG TABS tablet Take 1 tablet (90 mg total) by mouth 2 (two) times daily. 60 tablet 3   cyclobenzaprine (FLEXERIL) 10 MG tablet Take 1 tablet (10 mg total) by mouth 3 (three) times daily as needed.For spasms 90 tablet 5   DULoxetine (CYMBALTA) 30 MG capsule Take 2 capsules (60 mg total) by mouth daily.     famotidine (PEPCID) 40 MG tablet Take 40 mg by mouth daily.      furosemide (LASIX) 20 MG tablet Take 1 tablet (20 mg total) by mouth daily as needed (for SOB or weight gain of 3 lbs overnight). 90 tablet 3   glipiZIDE (GLUCOTROL) 5 MG tablet Take 1 tablet (5 mg total) by mouth 2 (two) times daily before a meal. 180 tablet 1   glucose blood (GE100 BLOOD GLUCOSE TEST) test strip Use as instructed 100 each 12   JARDIANCE 25 MG TABS tablet Take  25 mg by mouth daily.     Lancets (ONETOUCH ULTRASOFT) lancets Check sugar twice daily 100 each 12   metFORMIN (GLUCOPHAGE) 1000 MG tablet Take 1 tablet (1,000 mg total) by mouth 2 (two) times daily with a meal. 180 tablet 1   metoprolol tartrate (LOPRESSOR) 100 MG tablet Take 1 tablet (100 mg total) by mouth 2 (two) times daily. 60 tablet 3   traMADol (ULTRAM) 50 MG tablet Take 1 tablet (50 mg total) by mouth every 6 (six) hours as needed. 20 tablet 0   traZODone (DESYREL) 50 MG tablet Take 50-100 mg by mouth at bedtime.     No facility-administered medications prior to visit.    Review of Systems  Review of Systems  Constitutional: Negative.   HENT: Negative.    Respiratory:  Positive for chest tightness and shortness of breath. Negative for cough and wheezing.   Cardiovascular:  Negative for leg swelling.     Physical Exam  BP 118/74 (BP Location: Left Arm, Cuff Size: Large)   Pulse 84   Temp 97.9 F (36.6 C) (Temporal)   Ht 5' 8"  (1.727 m)   Wt (!) 340 lb (154.2 kg)   SpO2 99%   BMI 51.70 kg/m  Physical Exam Constitutional:      Appearance: Normal appearance. He is obese. He is not ill-appearing.  HENT:      Head: Normocephalic and atraumatic.  Cardiovascular:     Rate and Rhythm: Normal rate and regular rhythm.  Pulmonary:     Effort: Pulmonary effort is normal.     Breath sounds: Normal breath sounds.  Musculoskeletal:        General: Normal range of motion.  Skin:    General: Skin is warm and dry.  Neurological:     General: No focal deficit present.     Mental Status: He is alert and oriented to person, place, and time. Mental status is at baseline.  Psychiatric:        Mood and Affect: Mood normal.        Behavior: Behavior normal.        Thought Content: Thought content normal.        Judgment: Judgment normal.      Lab Results:  CBC    Component Value Date/Time   WBC 10.3 09/29/2021 0843   RBC 5.07 09/29/2021 0843   HGB 14.0 09/29/2021 0843   HGB 13.4 12/08/2017 1151   HCT 42.9 09/29/2021 0843   HCT 40.8 12/08/2017 1151   PLT 247 09/29/2021 0843   PLT 249 12/08/2017 1151   MCV 84.6 09/29/2021 0843   MCV 86 12/08/2017 1151   MCH 27.6 09/29/2021 0843   MCHC 32.6 09/29/2021 0843   RDW 15.0 09/29/2021 0843   RDW 14.2 12/08/2017 1151   LYMPHSABS 2.1 12/08/2017 1151   MONOABS 648 02/08/2016 1158   EOSABS 0.3 12/08/2017 1151   BASOSABS 0.0 12/08/2017 1151    BMET    Component Value Date/Time   NA 137 09/29/2021 0843   NA 139 12/08/2017 1150   K 4.3 09/29/2021 0843   CL 105 09/29/2021 0843   CO2 21 (L) 09/29/2021 0843   GLUCOSE 181 (H) 09/29/2021 0843   BUN 24 (H) 09/29/2021 0843   BUN 22 12/08/2017 1150   CREATININE 1.21 09/29/2021 0843   CREATININE 0.66 (L) 02/08/2016 1158   CALCIUM 9.2 09/29/2021 0843   GFRNONAA >60 09/29/2021 0843   GFRAA >60 03/29/2019 1337    BNP  Component Value Date/Time   BNP 26.4 09/17/2021 0928    ProBNP No results found for: "PROBNP"  Imaging: No results found.   Assessment & Plan:   OSA on CPAP - History of moderate OSA, AHI 20/hr. He is not currently wearing CPAP, last used > 1 year ago. Mask was  interfering with his sleep. Needs repeat sleep study due to break in therapy and re-establish diagnosis of OSA.   Dyspnea - Patient has symptoms of dyspnea and chest tightness with exertion.  Pulmonary function testing in May 2022 was normal.  No evidence of obstructive or restrictive lung disease. He has a history of childhood asthma. SABA improves symptoms. He has never been on maintenance inhaler.  Recommend trial of Advair 250-56mg1 puff every 12 hour. He is currently attending pulmonary rehab.      EMartyn Ehrich NP 01/22/2022

## 2022-01-21 ENCOUNTER — Encounter: Payer: Self-pay | Admitting: *Deleted

## 2022-01-21 DIAGNOSIS — Z955 Presence of coronary angioplasty implant and graft: Secondary | ICD-10-CM

## 2022-01-21 NOTE — Progress Notes (Signed)
Cardiac Individual Treatment Plan  Patient Details  Name: Brett Marshall MRN: [id number] Date of Birth: [birth year] Referring Provider:   Flowsheet Row Cardiac Rehab from 10/21/2021 in [facility] Cardiac and Pulmonary Rehab  Referring Provider [name] MD       Initial Encounter Date:  Flowsheet Row Cardiac Rehab from 10/21/2021 in [facility] Cardiac and Pulmonary Rehab  Date 10/21/21       Visit Diagnosis: Status post coronary artery stent placement  Patient's Home Medications on Admission:  Current Outpatient Medications:    albuterol (VENTOLIN HFA) 108 (90 Base) MCG/ACT inhaler, Inhale 1-2 puffs into the lungs every 6 (six) hours as needed for wheezing or shortness of breath., Disp: 18 g, Rfl: 1   aspirin EC 81 MG tablet, Take 1 tablet (81 mg total) by mouth daily. Swallow whole., Disp: 30 tablet, Rfl: 6   atorvastatin (LIPITOR) 40 MG tablet, Take 1 tablet (40 mg total) by mouth daily at 6 PM., Disp: 30 tablet, Rfl: 2   BRILINTA 90 MG TABS tablet, Take 1 tablet (90 mg total) by mouth 2 (two) times daily., Disp: 60 tablet, Rfl: 3   cyclobenzaprine (FLEXERIL) 10 MG tablet, Take 1 tablet (10 mg total) by mouth 3 (three) times daily as needed.For spasms, Disp: 90 tablet, Rfl: 5   DULoxetine (CYMBALTA) 30 MG capsule, Take 2 capsules (60 mg total) by mouth daily., Disp: , Rfl:    famotidine (PEPCID) 40 MG tablet, Take 40 mg by mouth daily. , Disp: , Rfl:    fluticasone-salmeterol (ADVAIR) 250-50 MCG/ACT AEPB, Inhale 1 puff into the lungs every 12 (twelve) hours., Disp: 60 each, Rfl: 0   furosemide (LASIX) 20 MG tablet, Take 1 tablet (20 mg total) by mouth daily as needed (for SOB or weight gain of 3 lbs overnight)., Disp: 90 tablet, Rfl: 3   glipiZIDE (GLUCOTROL) 5 MG tablet, Take 1 tablet (5 mg total) by mouth 2 (two) times daily before a meal., Disp: 180 tablet, Rfl: 1   glucose blood (GE100 BLOOD GLUCOSE TEST) test strip, Use as instructed, Disp: 100 each, Rfl: 12   JARDIANCE 25 MG TABS  tablet, Take 25 mg by mouth daily., Disp: , Rfl:    Lancets (ONETOUCH ULTRASOFT) lancets, Check sugar twice daily, Disp: 100 each, Rfl: 12   metFORMIN (GLUCOPHAGE) 1000 MG tablet, Take 1 tablet (1,000 mg total) by mouth 2 (two) times daily with a meal., Disp: 180 tablet, Rfl: 1   metoprolol tartrate (LOPRESSOR) 100 MG tablet, Take 1 tablet (100 mg total) by mouth 2 (two) times daily., Disp: 60 tablet, Rfl: 3   traMADol (ULTRAM) 50 MG tablet, Take 1 tablet (50 mg total) by mouth every 6 (six) hours as needed., Disp: 20 tablet, Rfl: 0   traZODone (DESYREL) 50 MG tablet, Take 50-100 mg by mouth at bedtime., Disp: , Rfl:   Past Medical History: Past Medical History:  Diagnosis Date   Asthma    CAD (coronary artery disease)    a. 08/2017 Cath: Diff nonobs dzs, EF 45-50%, diff HK; b. 08/2021 MV: Ant/apical ishemia; c. 08/2021 PCI: LAD 95ost (4.0x15 Onyx Frontier DES), EF 55-65%; c. 09/2021 Cath: LM nl, LAD patent stent, 79m 30d, RI min irregs, LCX min irregs, RCA 50p. LVOT grad 50-876mg (rest), 70-11014m (provoked).   DDD (degenerative disc disease), lumbar    Depression    Diabetes mellitus without complication (HCCE. [place]  Essential hypertension, benign    HFimpEF (heart failure with improved ejection fraction) (HCCExmore  a.  08/2017 LV gram: EF 45-50%, glob HK; b. 08/2021 Echo: EF 60-65%, no rwma, GrI DD, nl RV fxn; c. 09/2021 cMRI: EF 60%, no LVOT obstruction/gradient. Mild conc LVH, mild AS. No myocardial scar/fibrosis. No evidence of HCM.   Leg swelling    Left   Low back pain    Mixed ICM & NICM    a. 08/2017 LV gram: EF 45-50%, glob HK; b. 08/2021 Echo: EF 60-65%.   Morbid obesity ([place])    OSA on CPAP    non compliant   Pulmonary hypertension (HCC)     Tobacco Use: Social History   Tobacco Use  Smoking Status Never  Smokeless Tobacco Never    Labs: Review Flowsheet  More data exists      Latest Ref Rng & Units 02/15/2017 05/24/2017 08/23/2017 12/08/2017 09/07/2021  Labs for ITP Cardiac and  Pulmonary Rehab  Cholestrol 0 - 200 mg/dL - - - 176  145   LDL (calc) 0 - 99 mg/dL - - - 103  79   HDL-C >40 mg/dL - - - 44  43   Trlycerides <150 mg/dL - - - 143  113   Hemoglobin A1c 4.8 - 5.6 % 8.1  8.1  C 6.6  6.9  7.5     Details      C Corrected result          Exercise Target Goals: Exercise Program Goal: Individual exercise prescription set using results from initial 6 min walk test and THRR while considering  patient's activity barriers and safety.   Exercise Prescription Goal: Initial exercise prescription builds to 30-45 minutes a day of aerobic activity, 2-3 days per week.  Home exercise guidelines will be given to patient during program as part of exercise prescription that the participant will acknowledge.   Education: Aerobic Exercise: - Group verbal and visual presentation on the components of exercise prescription. Introduces F.I.T.T principle from ACSM for exercise prescriptions.  Reviews F.I.T.T. principles of aerobic exercise including progression. Written material given at graduation.   Education: Resistance Exercise: - Group verbal and visual presentation on the components of exercise prescription. Introduces F.I.T.T principle from ACSM for exercise prescriptions  Reviews F.I.T.T. principles of resistance exercise including progression. Written material given at graduation.    Education: Exercise & Equipment Safety: - Individual verbal instruction and demonstration of equipment use and safety with use of the equipment. Flowsheet Row Cardiac Rehab from 12/11/2021 in [facility] Cardiac and Pulmonary Rehab  Date 10/21/21  Educator [facility]  Instruction Review Code 1- Verbalizes Understanding       Education: Exercise Physiology & General Exercise Guidelines: - Group verbal and written instruction with models to review the exercise physiology of the cardiovascular system and associated critical values. Provides general exercise guidelines with specific guidelines to  those with heart or lung disease.  Flowsheet Row Cardiac Rehab from 12/11/2021 in [facility] Cardiac and Pulmonary Rehab  Date 11/13/21  Educator NT  Instruction Review Code 1- [employer] Understanding       Education: Flexibility, Balance, Mind/Body Relaxation: - Group verbal and visual presentation with interactive activity on the components of exercise prescription. Introduces F.I.T.T principle from ACSM for exercise prescriptions. Reviews F.I.T.T. principles of flexibility and balance exercise training including progression. Also discusses the mind body connection.  Reviews various relaxation techniques to help reduce and manage stress (i.e. Deep breathing, progressive muscle relaxation, and visualization). Balance handout provided to take home. Written material given at graduation.   Activity Barriers & Risk Stratification:  Activity  Barriers & Cardiac Risk Stratification - 10/21/21 1028       Activity Barriers & Cardiac Risk Stratification   Activity Barriers Deconditioning;Muscular Weakness;Shortness of Breath;Balance Concerns;Neck/Spine Problems;Joint Problems   DDD in neck and spine, L shoulder limited   Cardiac Risk Stratification Moderate             6 Minute Walk:  6 Minute Walk     Row Name 10/21/21 1027         6 Minute Walk   Phase Initial     Distance 1060 feet     Walk Time 6 minutes     # of Rest Breaks 0     MPH 2     METS 2.16     RPE 14     Perceived Dyspnea  3     VO2 Peak 7.56     Symptoms Yes (comment)     Comments chest tightness 6/10, SOB     Resting HR 70 bpm     Resting BP 124/64     Resting Oxygen Saturation  98 %     Exercise Oxygen Saturation  during 6 min walk 100 %     Max Ex. HR 109 bpm     Max Ex. BP 128/64     2 Minute Post BP 114/66              Oxygen Initial Assessment:   Oxygen Re-Evaluation:   Oxygen Discharge (Final Oxygen Re-Evaluation):   Initial Exercise Prescription:  Initial Exercise Prescription - 10/21/21  1000       Date of Initial Exercise RX and Referring Provider   Date 10/21/21    Referring Provider [name] MD      Oxygen   Maintain Oxygen Saturation 88% or higher      Treadmill   MPH 1.8    Grade 0.5    Minutes 15    METs 2.52      REL-XR   Level 1    Speed 50    Minutes 15    METs 2.5      T5 Nustep   Level 2    SPM 80    Minutes 15    METs 2.5      Biostep-RELP   Level 2    SPM 50    Minutes 15    METs 2      Track   Laps 28    Minutes 15    METs 2.52      Prescription Details   Frequency (times per week) 2    Duration Progress to 30 minutes of continuous aerobic without signs/symptoms of physical distress      Intensity   THRR 40-80% of Max Heartrate 108-145    Ratings of Perceived Exertion 11-13    Perceived Dyspnea 0-4      Progression   Progression Continue to progress workloads to maintain intensity without signs/symptoms of physical distress.      Resistance Training   Training Prescription Yes    Weight 4 lb    Reps 10-15             Perform Capillary Blood Glucose checks as needed.  Exercise Prescription Changes:   Exercise Prescription Changes     Row Name 10/21/21 1000 11/03/21 1600 12/16/21 1400 01/13/22 1500       Response to Exercise   Blood Pressure (Admit) 124/64 110/66 124/72 124/76    Blood Pressure (Exercise) 128/64 138/68 144/64 148/72  Blood Pressure (Exit) 114/66 108/68 126/70 124/62    Heart Rate (Admit) 70 bpm 72 bpm 85 bpm 87 bpm    Heart Rate (Exercise) 109 bpm 115 bpm 121 bpm 111 bpm    Heart Rate (Exit) 80 bpm 87 bpm 86 bpm 85 bpm    Oxygen Saturation (Admit) 98 % -- -- --    Oxygen Saturation (Exercise) 100 % -- -- --    Rating of Perceived Exertion (Exercise) 14 14 14 16     Perceived Dyspnea (Exercise) 3 -- -- --    Symptoms SOB, chest tightness 6/10 none none none    Comments walk test results 3rd full day of exercise -- --    Duration -- Progress to 30 minutes of  aerobic without  signs/symptoms of physical distress Continue with 30 min of aerobic exercise without signs/symptoms of physical distress. Continue with 30 min of aerobic exercise without signs/symptoms of physical distress.    Intensity -- THRR unchanged THRR unchanged THRR unchanged      Progression   Progression -- Continue to progress workloads to maintain intensity without signs/symptoms of physical distress. Continue to progress workloads to maintain intensity without signs/symptoms of physical distress. Continue to progress workloads to maintain intensity without signs/symptoms of physical distress.    Average METs -- 2.19 2.61 2.61      Resistance Training   Training Prescription -- Yes Yes Yes    Weight -- 4 lb 5 lb 5 lb    Reps -- 10-15 10-15 10-15      Interval Training   Interval Training -- No No No      Treadmill   MPH -- 1.8 -- --    Grade -- 0.5 -- --    Minutes -- 15 -- --    METs -- 2.5 -- --      Recumbant Bike   Level -- -- 4 4    Watts -- -- 63 --    Minutes -- -- 15 15    METs -- -- 2.52 2.52      NuStep   Level -- -- 4 4    Minutes -- -- 15 15    METs -- -- 3.3 2.7      REL-XR   Level -- 1 -- --    Minutes -- 15 -- --    METs -- 1.3 -- --      T5 Nustep   Level -- 2 -- --    Minutes -- 15 -- --    METs -- 2.4 -- --      Biostep-RELP   Level -- 2 4 --    Minutes -- 15 15 --    METs -- 2 2 --      Track   Laps -- -- 30 --    Minutes -- -- 15 --    METs -- -- 2.63 --      Oxygen   Maintain Oxygen Saturation -- 88% or higher 88% or higher 88% or higher             Exercise Comments:   Exercise Comments     Row Name 10/23/21 0800           Exercise Comments First full day of exercise!  Patient was oriented to gym and equipment including functions, settings, policies, and procedures.  Patient's individual exercise prescription and treatment plan were reviewed.  All starting workloads were established based on the results of the 6 minute walk test  done at initial orientation visit.  The plan for exercise progression was also introduced and progression will be customized based on patient's performance and goals.                Exercise Goals and Review:   Exercise Goals     Row Name 10/21/21 1031             Exercise Goals   Increase Physical Activity Yes       Intervention Provide advice, education, support and counseling about physical activity/exercise needs.;Develop an individualized exercise prescription for aerobic and resistive training based on initial evaluation findings, risk stratification, comorbidities and participant's personal goals.       Expected Outcomes Long Term: Add in home exercise to make exercise part of routine and to increase amount of physical activity.;Short Term: Attend rehab on a regular basis to increase amount of physical activity.;Long Term: Exercising regularly at least 3-5 days a week.       Increase Strength and Stamina Yes       Intervention Provide advice, education, support and counseling about physical activity/exercise needs.;Develop an individualized exercise prescription for aerobic and resistive training based on initial evaluation findings, risk stratification, comorbidities and participant's personal goals.       Expected Outcomes Short Term: Increase workloads from initial exercise prescription for resistance, speed, and METs.;Short Term: Perform resistance training exercises routinely during rehab and add in resistance training at home;Long Term: Improve cardiorespiratory fitness, muscular endurance and strength as measured by increased METs and functional capacity (6MWT)       Able to understand and use rate of perceived exertion (RPE) scale Yes       Intervention Provide education and explanation on how to use RPE scale       Expected Outcomes Short Term: Able to use RPE daily in rehab to express subjective intensity level;Long Term:  Able to use RPE to guide intensity level when  exercising independently       Able to understand and use Dyspnea scale Yes       Intervention Provide education and explanation on how to use Dyspnea scale       Expected Outcomes Short Term: Able to use Dyspnea scale daily in rehab to express subjective sense of shortness of breath during exertion;Long Term: Able to use Dyspnea scale to guide intensity level when exercising independently       Knowledge and understanding of Target Heart Rate Range (THRR) Yes       Intervention Provide education and explanation of THRR including how the numbers were predicted and where they are located for reference       Expected Outcomes Short Term: Able to state/look up THRR;Short Term: Able to use daily as guideline for intensity in rehab;Long Term: Able to use THRR to govern intensity when exercising independently       Able to check pulse independently Yes       Intervention Provide education and demonstration on how to check pulse in carotid and radial arteries.;Review the importance of being able to check your own pulse for safety during independent exercise       Expected Outcomes Short Term: Able to explain why pulse checking is important during independent exercise;Long Term: Able to check pulse independently and accurately       Understanding of Exercise Prescription Yes       Intervention Provide education, explanation, and written materials on patient's individual exercise prescription       Expected  Outcomes Short Term: Able to explain program exercise prescription;Long Term: Able to explain home exercise prescription to exercise independently                Exercise Goals Re-Evaluation :  Exercise Goals Re-Evaluation     Row Name 10/23/21 0800 11/03/21 1619 11/13/21 0732 12/08/21 0725 12/16/21 1422     Exercise Goal Re-Evaluation   Exercise Goals Review Able to understand and use rate of perceived exertion (RPE) scale;Knowledge and understanding of Target Heart Rate Range  (THRR);Understanding of Exercise Prescription;Increase Physical Activity;Increase Strength and Stamina;Able to understand and use Dyspnea scale;Able to check pulse independently Increase Physical Activity;Increase Strength and Stamina;Understanding of Exercise Prescription Increase Physical Activity;Increase Strength and Stamina;Understanding of Exercise Prescription;Able to understand and use rate of perceived exertion (RPE) scale;Able to understand and use Dyspnea scale;Knowledge and understanding of Target Heart Rate Range (THRR);Able to check pulse independently Increase Physical Activity;Increase Strength and Stamina;Understanding of Exercise Prescription Increase Physical Activity;Increase Strength and Stamina;Understanding of Exercise Prescription   Comments Reviewed RPE and dyspnea scales, THR and program prescription with pt today.  Pt voiced understanding and was given a copy of goals to take home. Johnhenry is doing well for the first couple of sessions that he has been here. He was able to follow his initial exercise prescription. He had a hypertensive BP response on his first day but was re-checked and back to normal range. We will continue to monitor as he progresses in the program. Reviewed home exercise with pt today.  Pt plans to walk and use staff videos at home for exercise.  Reviewed THR, pulse, RPE, sign and symptoms, pulse oximetery and when to call 911 or MD.  Also discussed weather considerations and indoor options.  Pt voiced understanding.  Christipher mentioned today that he is having chest tightness today like pressure on his chest.  It started early this morning.  He was encourged to call his doctor. Patient reports that he has incorporated some exercise at home on off days of rehab. He comes to rehab 2 times a week and has added 2-3 days of walking at home. He reported that he does want to start progressing at home and do more walking as he continues to get stronger. He has communicated with his  doctor about his chest pressure and he is activly monitoring his symptoms. Alarik has been doing well in rehab. He recently improved his overall average METs to 2.61 METs. He also increased from 28 to 30 laps on the track. Taras improved to level 4 on the Biostep as well. He also began using 5 lb hand weights for resistance training as well. We will continue to monitor his progress in the program.   Expected Outcomes Short: Use RPE daily to regulate intensity. Long: Follow program prescription in THR. Short: Follow current exercise prescription Long: Increase overall strength and MET level Short: Call doctor about chest pressure Long: Conitnue to exercise independently Short: continue to monitor chest pressure and be in communication with his doctor about any changes or concerns. Continue to progresss with home exercise as tolerated without symptoms. Long: continue to exercise independently. Short: Continue to push for more laps on the track. Long: Continue to increase strength and stamina.    Delta Name 12/29/21 0725 01/13/22 1509           Exercise Goal Re-Evaluation   Exercise Goals Review Increase Physical Activity;Increase Strength and Stamina;Understanding of Exercise Prescription Increase Physical Activity;Increase Strength and Stamina;Understanding of Exercise Prescription  Comments Kellin was out last week with a stomach bug.  He was not able to exercise while he was out.  He is walking some outside on his off days.  He will go up and down street.  He was starting to feel like his strength and stamina were recovering and now feels like being sick has definitely set him back some.  He wants to rebuild his stamina again. Porter has only attended rehab once since the last evaluation. His overall average MET level has been consistent at 2.61 METs. He has also done well with level 4 on the recumbent bike and T4. He has tolerated using 5 lb hand weights for resistance training. We will continue to  monitor his progress in the program.      Expected Outcomes Short: Get back to regular exercise Long: Continue to improve stamina Short: Attend rehab regularly. Long: Continue to improve strength and stamina               Discharge Exercise Prescription (Final Exercise Prescription Changes):  Exercise Prescription Changes - 01/13/22 1500       Response to Exercise   Blood Pressure (Admit) 124/76    Blood Pressure (Exercise) 148/72    Blood Pressure (Exit) 124/62    Heart Rate (Admit) 87 bpm    Heart Rate (Exercise) 111 bpm    Heart Rate (Exit) 85 bpm    Rating of Perceived Exertion (Exercise) 16    Symptoms none    Duration Continue with 30 min of aerobic exercise without signs/symptoms of physical distress.    Intensity THRR unchanged      Progression   Progression Continue to progress workloads to maintain intensity without signs/symptoms of physical distress.    Average METs 2.61      Resistance Training   Training Prescription Yes    Weight 5 lb    Reps 10-15      Interval Training   Interval Training No      Recumbant Bike   Level 4    Minutes 15    METs 2.52      NuStep   Level 4    Minutes 15    METs 2.7      Oxygen   Maintain Oxygen Saturation 88% or higher             Nutrition:  Target Goals: Understanding of nutrition guidelines, daily intake of sodium <1536m, cholesterol <2038m calories 30% from fat and 7% or less from saturated fats, daily to have 5 or more servings of fruits and vegetables.  Education: All About Nutrition: -Group instruction provided by verbal, written material, interactive activities, discussions, models, and posters to present general guidelines for heart healthy nutrition including fat, fiber, MyPlate, the role of sodium in heart healthy nutrition, utilization of the nutrition label, and utilization of this knowledge for meal planning. Follow up email sent as well. Written material given at graduation. Flowsheet Row  [facility]  Education need identified 10/21/21  Date 12/11/21  Educator [place]ohn[place] Park[place]ruction Review Code 1- Verbalizes Understanding       Biometrics:  Pre Biometrics - 10/21/21 1031       Pre Biometrics   Height 5' 9.5" (1.765 m)    Weight 329 lb 8 oz (149.5 kg)    BMI (Calculated) 47.98    Single Leg Stand 3.5 seconds              Nutrition  Therapy Plan and Nutrition Goals:  Nutrition Therapy & Goals - 11/11/21 1329       Nutrition Therapy   Diet Heart Healthy, T2DM MNT    Drug/Food Interactions Statins/Certain Fruits    Protein (specify units) 80g    Fiber 30 grams    Whole Grain Foods 3 servings    Saturated Fats 16 max. grams    Fruits and Vegetables 8 servings/day    Sodium 2 grams      Personal Nutrition Goals   Nutrition Goal ST: swap out some grains with whole grains - try whole grain bread, brainstorm easy to prep meals  LT: Limit eating out <3x/week, follow MyPlate guidelines, limit Na <2g    Comments 56 y.o. M admitted to cardiac rehab s/p coronary artery stent placement. PMHx includes HTN, HLD, chronic systolic congestive heart failure, mixed ischemic and nonischemic cardiomyopathy, CAD, T2DM, GERD. Relevant medications include lipitor, cymbalta, pepcid, lasix, glipizide, jardiance, metformin, tramadol, trazodone. PYP Score: 53. Vegetables & Fruits 6/12. Breads, Grains & Cereals 6/12. Red & Processed Meat 8/12. Poultry 0/2. Fish & Shellfish 0/4. B: Bagel (cinnamon raisin - thomas's) with cream cheese or toast (italian white bread with peanut butter) and sometimes bacon, egg and cheese biscuit - with unsweetened tea or OJ S: granola bar with peanuts in it or some fruit L: sandwich ([place] with mayo and american cheese on white bread) or chicken sandwich (roll or bun) or sometimes pizza on the weekend or sometimes house salad ([place] or caesar). S: usually no snack, but if he does he will have sun chips (he likes  the crunch factor). D: house salad, door dash (salads from [employer] with chicken, hamburger, hotdog, steak, pizza, spaghetti, [place] food, [place] (brown rice with order)).  S: sun chips sometimes. Drinks: orange soda with no added sugar or gaterade sometimes, not usually water. He will occasionally get heartburn. Discussed general heart healthy eating and diabetes friendly eating.      Intervention Plan   Intervention Prescribe, educate and counsel regarding individualized specific dietary modifications aiming towards targeted core components such as weight, hypertension, lipid management, diabetes, heart failure and other comorbidities.    Expected Outcomes Long Term Goal: Adherence to prescribed nutrition plan.;Short Term Goal: A plan has been developed with personal nutrition goals set during dietitian appointment.;Short Term Goal: Understand basic principles of dietary content, such as calories, fat, sodium, cholesterol and nutrients.             Nutrition Assessments:  MEDIFICTS Score Key: ?70 Need to make dietary changes  40-70 Heart Healthy Diet ? 40 Therapeutic Level Cholesterol Diet  Flowsheet Row Cardiac Rehab from 10/21/2021 in [facility] Cardiac and Pulmonary Rehab  Picture Your Plate Total Score on Admission 53      Picture Your Plate Scores: <81 Unhealthy dietary pattern with much room for improvement. 41-50 Dietary pattern unlikely to meet recommendations for good health and room for improvement. 51-60 More healthful dietary pattern, with some room for improvement.  >60 Healthy dietary pattern, although there may be some specific behaviors that could be improved.    Nutrition Goals Re-Evaluation:  Nutrition Goals Re-Evaluation     [place] Name 12/08/21 0735 12/29/21 0731           Goals   Nutrition Goal ST: swap out some grains with whole grains - try whole grain bread, brainstorm easy to prep meals  LT: Limit eating out <3x/week, follow MyPlate guidelines, limit  Na <2g Short: work on finding easy meal  prep ideas. Long: help control cardiac risk factors with heart healthy eating.      Comment Patient reports that he has been trying to switch from refined grains to whole grains, and has been reducing the amount of sodium consumption. He is incorporating more fruits and vegetables into his diet and drinking more water instead of other drinks. He still needs to work on brainstorming for easy meal prep ideas. Tryce is doing well with his diet.  Last week he had stomach bug and couldn't eat a whole lot.  But, prior to that, he was starting to work on meal prep.  He continues to try to find new options and plans ahead.  He is trying to get in more whole grains still.  He has not cut back on eating out eat, but he is trying to make more healthier options now.  He is trying to read labels more and is more aware of it.      Expected Outcome Short: work on finding easy meal prep ideas. Long: help control cardiac risk factors with heart healthy eating. Short: Continue to cut back on eating out Long: Conitnue to improve diet               Nutrition Goals Discharge (Final Nutrition Goals Re-Evaluation):  Nutrition Goals Re-Evaluation - 12/29/21 0731       Goals   Nutrition Goal Short: work on finding easy meal prep ideas. Long: help control cardiac risk factors with heart healthy eating.    Comment Iaan is doing well with his diet.  Last week he had stomach bug and couldn't eat a whole lot.  But, prior to that, he was starting to work on meal prep.  He continues to try to find new options and plans ahead.  He is trying to get in more whole grains still.  He has not cut back on eating out eat, but he is trying to make more healthier options now.  He is trying to read labels more and is more aware of it.    Expected Outcome Short: Continue to cut back on eating out Long: Conitnue to improve diet             Psychosocial: Target Goals: Acknowledge presence or  absence of significant depression and/or stress, maximize coping skills, provide positive support system. Participant is able to verbalize types and ability to use techniques and skills needed for reducing stress and depression.   Education: Stress, Anxiety, and Depression - Group verbal and visual presentation to define topics covered.  Reviews how body is impacted by stress, anxiety, and depression.  Also discusses healthy ways to reduce stress and to treat/manage anxiety and depression.  Written material given at graduation. Flowsheet Row Cardiac Rehab from 12/11/2021 in [facility] Cardiac and Pulmonary Rehab  Date 11/06/21  Educator [facility]  Instruction Review Code 1- [employer] Understanding       Education: Sleep Hygiene -Provides group verbal and written instruction about how sleep can affect your health.  Define sleep hygiene, discuss sleep cycles and impact of sleep habits. Review good sleep hygiene tips.    Initial Review & Psychosocial Screening:  Initial Psych Review & Screening - 10/08/21 1343       Initial Review   Current issues with None Identified      Family Dynamics   Good Support System? Yes   Wife,friends, church friends     Barriers   Psychosocial barriers to participate in program There are no identifiable barriers  or psychosocial needs.      Screening Interventions   Interventions Encouraged to exercise;To provide support and resources with identified psychosocial needs;Provide feedback about the scores to participant    Expected Outcomes Short Term goal: Utilizing psychosocial counselor, staff and physician to assist with identification of specific Stressors or current issues interfering with healing process. Setting desired goal for each stressor or current issue identified.;Long Term Goal: Stressors or current issues are controlled or eliminated.;Short Term goal: Identification and review with participant of any Quality of Life or Depression concerns found by scoring the  questionnaire.;Long Term goal: The participant improves quality of Life and PHQ9 Scores as seen by post scores and/or verbalization of changes             Quality of Life Scores:   Quality of Life - 10/21/21 1032       Quality of Life   Select Quality of Life      Quality of Life Scores   Health/Function Pre 12.2 %    Socioeconomic Pre 22.93 %    Psych/Spiritual Pre 16.29 %    Family Pre 22.5 %    GLOBAL Pre 16.59 %            Scores of 19 and below usually indicate a poorer quality of life in these areas.  A difference of  2-3 points is a clinically meaningful difference.  A difference of 2-3 points in the total score of the Quality of Life Index has been associated with significant improvement in overall quality of life, self-image, physical symptoms, and general health in studies assessing change in quality of life.  PHQ-9: Review Flowsheet  More data exists      12/29/2021 12/08/2021 11/13/2021 10/21/2021 12/08/2017  Depression screen PHQ 2/9  Decreased Interest 0 0 1 2 0  Down, Depressed, Hopeless 0 1 0 0 0  PHQ - 2 Score 0 1 1 2  0  Altered sleeping 1 2 2 2  -  Tired, decreased energy 2 2 2 3  -  Change in appetite 0 0 1 2 -  Feeling bad or failure about yourself  0 0 0 0 -  Trouble concentrating 0 1 0 0 -  Moving slowly or fidgety/restless 0 1 0 1 -  Suicidal thoughts 0 0 0 0 -  PHQ-9 Score 3 7 6 10  -  Difficult doing work/chores Somewhat difficult Somewhat difficult Somewhat difficult Somewhat difficult -   Interpretation of Total Score  Total Score Depression Severity:  1-4 = Minimal depression, 5-9 = Mild depression, 10-14 = Moderate depression, 15-19 = Moderately severe depression, 20-27 = Severe depression   Psychosocial Evaluation and Intervention:  Psychosocial Evaluation - 10/08/21 1401       Psychosocial Evaluation & Interventions   Interventions Encouraged to exercise with the program and follow exercise prescription    Comments Dayyan has no barriers  to attending the program. He lives with his wife,. His wife, friends and church friends are his support team. Adair is working on weight loss. He has lost over 100 lbs and is continuing the weight loss journey. He wants to learn how to manage an exercise program and to continue with his weight loss. HIs long term goal is to reduce the anmount of medicine he is taking.    Expected Outcomes STG Mccade is able to attend all scheduled sessions, he works with EP and RD to continue his weight loss journey, and he manages to progress to a routine exercise program LTG Ezekial  continues to progress with exercise and weight loss after discharge. He is able to in the longterm to decrease his medication needs    Continue Psychosocial Services  Follow up required by staff             Psychosocial Re-Evaluation:  Psychosocial Re-Evaluation     [place] Name 11/13/21 0735 12/08/21 0732 12/08/21 0738 12/29/21 0727       Psychosocial Re-Evaluation   Current issues with Current Stress Concerns;Current Sleep Concerns Current Stress Concerns;Current Sleep Concerns Current Stress Concerns;Current Sleep Concerns Current Stress Concerns;Current Sleep Concerns    Comments Ramey's PHQ has improved to 6 down from 10.  He is still having difficulty with sleep and energy levels.  He is also having chest tightness today.  He was encouraged to talk to the doctor about chest tightness.  He has not had a recent sleep study but will see a pulmonologist in August and wants to get a new CPAP mask as his does not stay on very well.  Thus his CPAP compliance is not the greatest as it usually falls off during the night.  These are his biggest sources of stressors.  He has noticed the bruising popping up from his blood thinner that sometimes catches him off guard. -- Tomy's PHQ follow up was 7. This was slightly higher then last time but still lower then when he started. He does feel like he is doing better mentally then when he started the  program. He still reports some sleep trouble but nothing new and has an appointment with the company to talk about his new CPAP machine scheduled for august. He has talked to his doctor about his chest tightness and  he is monitoring it and in communication with his doctor about that. Moua is doing well in rehab.  He was out last week with a stomach bug.  He was frustrated to feel held back with it.  Despite being sick, his PHQ has improved to a 3!!  He is his still struggling with sleep.  He wakes freqently at night and sometimes struggles to get back to sleep, but sometimes not able to return to sleep.  He has his fitting for a new CPAP this Friday.  Overall, he is feeling better.    Expected Outcomes Short: Talk to doctor about chest tightness Long: Continue to work on sleep -- Short: have appointment with CPAP company to get new machine. Long: Maintain godd mental health habits. Short: Start wearing CPAP again after appt Long: Continue to exercise for mental boost.    Interventions Encouraged to attend Cardiac Rehabilitation for the exercise -- Encouraged to attend Cardiac Rehabilitation for the exercise Encouraged to attend Cardiac Rehabilitation for the exercise;Stress management education    Continue Psychosocial Services  Follow up required by staff -- Follow up required by staff Follow up required by staff             Psychosocial Discharge (Final Psychosocial Re-Evaluation):  Psychosocial Re-Evaluation - 12/29/21 0727       Psychosocial Re-Evaluation   Current issues with Current Stress Concerns;Current Sleep Concerns    Comments Thiago is doing well in rehab.  He was out last week with a stomach bug.  He was frustrated to feel held back with it.  Despite being sick, his PHQ has improved to a 3!!  He is his still struggling with sleep.  He wakes freqently at night and sometimes struggles to get back to sleep, but sometimes not able to  return to sleep.  He has his fitting for a new CPAP  this Friday.  Overall, he is feeling better.    Expected Outcomes Short: Start wearing CPAP again after appt Long: Continue to exercise for mental boost.    Interventions Encouraged to attend Cardiac Rehabilitation for the exercise;Stress management education    Continue Psychosocial Services  Follow up required by staff             Vocational Rehabilitation: Provide vocational rehab assistance to qualifying candidates.   Vocational Rehab Evaluation & Intervention:   Education: Education Goals: Education classes will be provided on a variety of topics geared toward better understanding of heart health and risk factor modification. Participant will state understanding/return demonstration of topics presented as noted by education test scores.  Learning Barriers/Preferences:   General Cardiac Education Topics:  AED/CPR: - Group verbal and written instruction with the use of models to demonstrate the basic use of the AED with the basic ABC's of resuscitation.   Anatomy and Cardiac Procedures: - Group verbal and visual presentation and models provide information about basic cardiac anatomy and function. Reviews the testing methods done to diagnose heart disease and the outcomes of the test results. Describes the treatment choices: Medical Management, Angioplasty, or Coronary Bypass Surgery for treating various heart conditions including Myocardial Infarction, Angina, Valve Disease, and Cardiac Arrhythmias.  Written material given at graduation. Flowsheet Row Cardiac Rehab from 12/11/2021 in [facility] Cardiac and Pulmonary Rehab  Education need identified 10/21/21  Date 11/27/21  Educator SB  Instruction Review Code 1- Verbalizes Understanding       Medication Safety: - Group verbal and visual instruction to review commonly prescribed medications for heart and lung disease. Reviews the medication, class of the drug, and side effects. Includes the steps to properly store meds and  maintain the prescription regimen.  Written material given at graduation.   Intimacy: - Group verbal instruction through game format to discuss how heart and lung disease can affect sexual intimacy. Written material given at graduation..   Know Your Numbers and Heart Failure: - Group verbal and visual instruction to discuss disease risk factors for cardiac and pulmonary disease and treatment options.  Reviews associated critical values for Overweight/Obesity, Hypertension, Cholesterol, and Diabetes.  Discusses basics of heart failure: signs/symptoms and treatments.  Introduces Heart Failure Zone chart for action plan for heart failure.  Written material given at graduation.   Infection Prevention: - Provides verbal and written material to individual with discussion of infection control including proper hand washing and proper equipment cleaning during exercise session. Flowsheet Row Cardiac Rehab from 12/11/2021 in [facility] Cardiac and Pulmonary Rehab  Date 10/21/21  Educator [facility]  Instruction Review Code 1- Verbalizes Understanding       Falls Prevention: - Provides verbal and written material to individual with discussion of falls prevention and safety. Flowsheet Row Cardiac Rehab from 12/11/2021 in [facility] Cardiac and Pulmonary Rehab  Date 10/08/21  Educator SB  Instruction Review Code 1- Verbalizes Understanding       Other: -Provides group and verbal instruction on various topics (see comments)   Knowledge Questionnaire Score:  Knowledge Questionnaire Score - 10/21/21 1033       Knowledge Questionnaire Score   Pre Score 24/26             Core Components/Risk Factors/Patient Goals at Admission:  Personal Goals and Risk Factors at Admission - 10/21/21 1033       Core Components/Risk Factors/Patient Goals on Admission  Weight Management Yes;Obesity;Weight Loss    Intervention Weight Management: Develop a combined nutrition and exercise program designed to reach desired  caloric intake, while maintaining appropriate intake of nutrient and fiber, sodium and fats, and appropriate energy expenditure required for the weight goal.;Weight Management/Obesity: Establish reasonable short term and long term weight goals.;Obesity: Provide education and appropriate resources to help participant work on and attain dietary goals.;Weight Management: Provide education and appropriate resources to help participant work on and attain dietary goals.    Admit Weight 329 lb 8 oz (149.5 kg)    Goal Weight: Short Term 324 lb (147 kg)    Goal Weight: Long Term 225 lb (102.1 kg)    Expected Outcomes Short Term: Continue to assess and modify interventions until short term weight is achieved;Long Term: Adherence to nutrition and physical activity/exercise program aimed toward attainment of established weight goal;Weight Loss: Understanding of general recommendations for a balanced deficit meal plan, which promotes 1-2 lb weight loss per week and includes a negative energy balance of [phone] kcal/d;Understanding recommendations for meals to include 15-35% energy as protein, 25-35% energy from fat, 35-60% energy from carbohydrates, less than 234m of dietary cholesterol, 20-35 gm of total fiber daily;Understanding of distribution of calorie intake throughout the day with the consumption of 4-5 meals/snacks    Diabetes Yes    Intervention Provide education about signs/symptoms and action to take for hypo/hyperglycemia.;Provide education about proper nutrition, including hydration, and aerobic/resistive exercise prescription along with prescribed medications to achieve blood glucose in normal ranges: Fasting glucose 65-99 mg/dL    Expected Outcomes Short Term: Participant verbalizes understanding of the signs/symptoms and immediate care of hyper/hypoglycemia, proper foot care and importance of medication, aerobic/resistive exercise and nutrition plan for blood glucose control.;Long Term: Attainment of  HbA1C < 7%.    Heart Failure Yes    Intervention Provide a combined exercise and nutrition program that is supplemented with education, support and counseling about heart failure. Directed toward relieving symptoms such as shortness of breath, decreased exercise tolerance, and extremity edema.    Expected Outcomes Short term: Attendance in program 2-3 days a week with increased exercise capacity. Reported lower sodium intake. Reported increased fruit and vegetable intake. Reports medication compliance.;Improve functional capacity of life;Short term: Daily weights obtained and reported for increase. Utilizing diuretic protocols set by physician.;Long term: Adoption of self-care skills and reduction of barriers for early signs and symptoms recognition and intervention leading to self-care maintenance.    Hypertension Yes    Intervention Provide education on lifestyle modifcations including regular physical activity/exercise, weight management, moderate sodium restriction and increased consumption of fresh fruit, vegetables, and low fat dairy, alcohol moderation, and smoking cessation.;Monitor prescription use compliance.    Expected Outcomes Short Term: Continued assessment and intervention until BP is < 140/957mHG in hypertensive participants. < 130/8074mG in hypertensive participants with diabetes, heart failure or chronic kidney disease.;Long Term: Maintenance of blood pressure at goal levels.    Lipids Yes    Intervention Provide education and support for participant on nutrition & aerobic/resistive exercise along with prescribed medications to achieve LDL <25m35mDL >40mg40m Expected Outcomes Short Term: Participant states understanding of desired cholesterol values and is compliant with medications prescribed. Participant is following exercise prescription and nutrition guidelines.;Long Term: Cholesterol controlled with medications as prescribed, with individualized exercise RX and with personalized  nutrition plan. Value goals: LDL < 25mg,45m > 40 mg.             Education:Diabetes -  Individual verbal and written instruction to review signs/symptoms of diabetes, desired ranges of glucose level fasting, after meals and with exercise. Acknowledge that pre and post exercise glucose checks will be done for 3 sessions at entry of program. Haakon from 12/11/2021 in [facility] Cardiac and Pulmonary Rehab  Date 10/21/21  Educator [facility]  Instruction Review Code 1- Verbalizes Understanding       Core Components/Risk Factors/Patient Goals Review:   Goals and Risk Factor Review     Row Name 11/13/21 0741 12/08/21 0727 12/29/21 0733         Core Components/Risk Factors/Patient Goals Review   Personal Goals Review Weight Management/Obesity;Hypertension;Diabetes;Lipids Weight Management/Obesity;Hypertension;Diabetes;Lipids Weight Management/Obesity;Hypertension;Diabetes;Lipids     Review Yovany is doing well in rehab.  His weight has crept up some to 333 lb today.  He is not eating more and just met with dietitian this week.  We also reviewed home exercise today, so adding that it will help with calorie burn.  His pressures are doing well and checks them at home with a wrist monitor.  He also checks his sugars reguarly at home, today they were 140 and stay around that range.  He has been having chest pain and was encouraged to stop by office to talk to nurse or doctor. Patient reports taking all medication as prescribed. He monitors his blood glucose and blood pressure at home and reports them to be in acceptable ranges. His weight as had ups and downs but her reports that he does not have fluid retention problems. Mosie is doing well in rehab. His weight is staying pretty steady around 343 lb.  He is still trying to lose more.  His sugars are running in the 140s now.  This is an improvement from the 170-180s like when he started.  He will be getting his A1c checked on Friday.  He is  doing well with his pressures and continues to check them as well.     Expected Outcomes Short: Continue to work on weight loss Long: Continue to monitor risk factors Short: Continue to work on weight loss Long: Continue to monitor risk factors Short: Get updated A1c  Long: Conitnue to work on [employer] loss              Core Components/Risk Factors/Patient Goals at Discharge (Final Review):   Goals and Risk Factor Review - 12/29/21 0733       Core Components/Risk Factors/Patient Goals Review   Personal Goals Review Weight Management/Obesity;Hypertension;Diabetes;Lipids    Review Anna is doing well in rehab. His weight is staying pretty steady around 343 lb.  He is still trying to lose more.  His sugars are running in the 140s now.  This is an improvement from the 170-180s like when he started.  He will be getting his A1c checked on Friday.  He is doing well with his pressures and continues to check them as well.    Expected Outcomes Short: Get updated A1c  Long: Conitnue to work on weight loss             ITP Comments:  ITP Comments     Row Name 10/08/21 1401 10/21/21 1027 10/23/21 0800 10/29/21 1258 11/11/21 1422   ITP Comments Virtual orientation call completed today. he has an appointment on Date: 10/21/2021  for EP eval and gym Orientation.  Documentation of diagnosis can be found in [facility] Date: 09/04/2021 . Completed 6MWT and gym orientation. Initial ITP created and sent for review to Dr.  Emily Filbert, Medical Director. First full day of exercise!  Patient was oriented to gym and equipment including functions, settings, policies, and procedures.  Patient's individual exercise prescription and treatment plan were reviewed.  All starting workloads were established based on the results of the 6 minute walk test done at initial orientation visit.  The plan for exercise progression was also introduced and progression will be customized based on patient's performance and goals. 30 Day review  completed. Medical Director ITP review done, changes made as directed, and signed approval by Medical Director. Completed initial RD consultation    Row Name 11/26/21 0942 12/24/21 0743 01/21/22 1329       ITP Comments 30 Day review completed. Medical Director ITP review done, changes made as directed, and signed approval by Medical Director. 30 Day review completed. Medical Director ITP review done, changes made as directed, and signed approval by Medical Director. 30 Day review completed. Medical Director ITP review done, changes made as directed, and signed approval by Medical Director.              Comments:

## 2022-01-22 ENCOUNTER — Telehealth (INDEPENDENT_AMBULATORY_CARE_PROVIDER_SITE_OTHER): Payer: BLUE CROSS/BLUE SHIELD | Admitting: Primary Care

## 2022-01-22 DIAGNOSIS — J069 Acute upper respiratory infection, unspecified: Secondary | ICD-10-CM | POA: Diagnosis not present

## 2022-01-22 DIAGNOSIS — R0600 Dyspnea, unspecified: Secondary | ICD-10-CM | POA: Diagnosis not present

## 2022-01-22 DIAGNOSIS — G4733 Obstructive sleep apnea (adult) (pediatric): Secondary | ICD-10-CM

## 2022-01-22 DIAGNOSIS — I25119 Atherosclerotic heart disease of native coronary artery with unspecified angina pectoris: Secondary | ICD-10-CM

## 2022-01-22 DIAGNOSIS — R0609 Other forms of dyspnea: Secondary | ICD-10-CM

## 2022-01-22 NOTE — Assessment & Plan Note (Signed)
-   History of moderate OSA, AHI 20/hr. He is not currently wearing CPAP, last used > 1 year ago. Mask was interfering with his sleep. Needs repeat sleep study due to break in therapy and re-establish diagnosis of OSA.

## 2022-01-22 NOTE — Progress Notes (Signed)
Reviewed and agree with assessment/plan.   Chesley Mires, MD [facility] Pulmonary/Critical Care 01/22/2022, 11:18 AM Pager:  [phone]

## 2022-01-22 NOTE — Progress Notes (Signed)
Virtual Visit via Video Note  I connected with Brett Marshall on 01/22/22 at  9:00 AM EDT by a video enabled telemedicine application and verified that I am speaking with the correct person using two identifiers.  Location: Patient: Home Provider: Office    I discussed the limitations of evaluation and management by telemedicine and the availability of in person appointments. The patient expressed understanding and agreed to proceed.  History of Present Illness:  56 year old male, never smoked.  Past medical history significant for hypertension, chronic systolic congestive heart failure, pulmonary hypertension, OSA on CPAP, GERD, NASH, type 2 diabetes, morbid obesity.  Former patient of Dr. Ashby Dawes for initial consult on 12/30/2017 for excessive daytime sleepiness.  HST 12/30/2017 showed moderate obstructive sleep apnea, AHI 20/hr with SPO2 low 78%. Patient had old CPAP machine and wanted to use this, ResMed series 8.  DME not able to accommodate settings.  Patient applied for CPAP assistance program.   Previous [facility] pulmonary encounter: 09/11/2020 Patient presents today for overdue follow-up OSA. Fromer Dr. Ashby Dawes patient. He has been off cpap for 6-8 months d/t mask fit issues. No recent download, pressure 5-20cm h20. Feels he is not getting enough pressure at times. He wears a full face mask, lays on his side which causes mask to slide off. He ordered supplies through [employer]. He also reports chest tightness with exertion or at rest for > 1 year. Associated dyspnea on exertion. No significant cough or wheezing. Associated post nasal drip. Works as Dance movement psychotherapist for [employer]. He has three cats and dog at home. Some exposure to black mold. He has lost 100 lbs in the last 2 years.   OSA on CPAP - Stopped wearing CPAP several months ago d.t mask issues  - Plan adjust CPAP pressure to 10-20cm h20 and recommend he look into getting CPAP pillow for side sleepers, if this does not work may  need cpap mask desensitization study in labs  - Advised he aim to wear CPAP every night for min 4-6 hours or longer  Dyspnea - Patient reports dyspnea and chest tightness with exertion > 1 year. No significant cough or wheezing. He has three cats and a dog at home and possible exposure to black mold.  - Needs CXR today and PFTs to further evaluated  - Sending in Albuterol rescue inhaler to use as needed q 6 hours for sob/wheezng  - FU in 4-8 weeks or sooner if needed/ may consider allergy panel   01/02/2022 Patient presents today for OV follow-up for OSA and DOE. He was last seen in April 2022. He continues to have persistent symptoms of dyspnea with exertion. He has a history of moderate obstructive sleep apnea but is not currently wearing CPAP. Home sleep test in August 2019, AHI 20/hr with SpO2 low 78%. He is a side or stomach sleeping. Mask was interfering with his sleep and tended to move at night. He last used CPAP over a year ago.  He had a recent normal CXR in May 2023. CTA imaging in May showed no airspace disease, effusion or pneumothorax. Central airways are patent. Pulmonary function testing in May 2022 was normal. No evidence of obstructive or restrictive lung disease.  Echocardiogram in April 2023 showed normal EF, grade 1 DD. He had a heart cath in April 2023 that showed severe one vessel coronary artery disease with 95% LAD stenosis. Ejection fraction 55-65% by visual estimate. Normal LV systolic function and mildly elevated left ventricular end diastolic pressure. Successful PCI and  drug eluting sent placement to the ostial LAD. He had a second heart cath in May 2023 that showed mild-moderate, nonobstructive CAD similar to previous. Patent ostial/proximal LAD stent.   He has dyspnea and chest tightness when walking on treadmill or long distances. He has some morning congestion, mainly post nasal drip but occasionally he will cough up some phlegm. Albuterol does help when he uses it. He  tells me that he had asthma as a child. He has never been on maintenance inhaler. He is attending pulmonary rehab.  01/22/2022 - Interim hx  Patient contacted today for a tooth we 3-week follow-up.  Home sleep study has yet to be scheduled, our patient care coordinator will reach out to him to set up timing for testing.  Patient was given a sample of Advair at her last visit. He has not noticed a huge difference since starting. He does report some mild improvement in dyspnea symptoms and feels his shortness of breath is not as bad as previously was, he would like to continue to use maintenance inhaler for now.  He develops a cough in the last day which is productive with clear to yellow mucus.  He feels somewhat fatigued today.  He has not checked home COVID test.  He is afebrile.    Observations/Objective:  Appears well without overt respiratory symptoms  Assessment and Plan:  OSA: -Home sleep study in August 2019 showed moderate obstructive sleep apnea, AHI 20 an hour.  Patient stopped wearing CPAP over 1 year ago.  We have ordered repeat home sleep test to break in therapy to reestablish diagnosis of OSA.  Our [facility] team will reach out to patient to schedule in the next 2 to 4 weeks hopefully.  Viral URI -Patient developed productive cough 1 to 2 days ago.  Associated fatigue and malaise.  Advise he check home COVID swab.  Recommend pushing oral fluids and taking Tylenol as needed every 4-6 hours for aches/fever.  If symptoms do not improve in the next 3 to 5 days patient to notify our office and may consider additional testing/treatment  Dyspnea - Patient has a history of childhood asthma.  He has never been on maintenance inhaler.  He has symptoms of dyspnea and chest tightness with exertion.   He reports benefit from [place] use.  Pulmonary function testing in the past without obstruction.  He is currently on Advair 250 50 mcg 1 puff twice daily.  He has noticed some mild improvement in shortness of  breath symptoms with ICS/LABA inhaler.  Recommend he continue as directed for now, can consider trial off if not clear benefit from use.  May need additional work-up.   CAD: - He had a heart cath in April 2023 that showed severe one vessel coronary artery disease with 95% LAD stenosis. Ejection fraction 55-65% by visual estimate. Normal LV systolic function and mildly elevated left ventricular end diastolic pressure. Successful PCI and drug eluting sent placement to the ostial LAD. He had a second heart cath in May 2023 that showed mild-moderate, nonobstructive CAD similar to previous. Patent ostial/proximal LAD stent. - Continue Brilinta, aspirin 81 mg daily and atorvastatin milligrams daily - Following with cardiology  Follow Up Instructions:  -  4 weeks with Eustaquio Maize NP  I discussed the assessment and treatment plan with the patient. The patient was provided an opportunity to ask questions and all were answered. The patient agreed with the plan and demonstrated an understanding of the instructions.   The patient was advised  to call back or seek an in-person evaluation if the symptoms worsen or if the condition fails to improve as anticipated.  I provided 22 minutes of non-face-to-face time during this encounter.   Martyn Ehrich, NP

## 2022-01-22 NOTE — Assessment & Plan Note (Signed)
-   Patient has symptoms of dyspnea and chest tightness with exertion.  Pulmonary function testing in May 2022 was normal.  No evidence of obstructive or restrictive lung disease. He has a history of childhood asthma. SABA improves symptoms. He has never been on maintenance inhaler.  Recommend trial of Advair 250-41mg1 puff every 12 hour. He is currently attending pulmonary rehab.

## 2022-01-26 ENCOUNTER — Encounter: Payer: BLUE CROSS/BLUE SHIELD | Attending: Cardiovascular Disease

## 2022-01-26 DIAGNOSIS — Z955 Presence of coronary angioplasty implant and graft: Secondary | ICD-10-CM | POA: Insufficient documentation

## 2022-01-29 ENCOUNTER — Other Ambulatory Visit: Payer: Self-pay

## 2022-01-29 MED ORDER — FLUTICASONE-SALMETEROL 250-50 MCG/ACT IN AEPB: 1.0000 | INHALATION_SPRAY | Freq: Two times a day (BID) | RESPIRATORY_TRACT | 6 refills | Status: AC

## 2022-02-10 ENCOUNTER — Ambulatory Visit: Payer: BLUE CROSS/BLUE SHIELD | Admitting: Nurse Practitioner

## 2022-02-10 ENCOUNTER — Telehealth: Payer: Self-pay | Admitting: Nurse Practitioner

## 2022-02-10 NOTE — Telephone Encounter (Signed)
LVM advising patient provider would prefer is he reschedule his appt if he wants to change it to a virtual appt.

## 2022-02-10 NOTE — Telephone Encounter (Signed)
Patient would like to switch his appt he has scheduled for today at 3:35pm to a virtual appt. Please advise.

## 2022-02-11 NOTE — Telephone Encounter (Signed)
Patient has rescheduled appointment

## 2022-02-11 NOTE — Telephone Encounter (Signed)
Provider would like patient to be seen in office. Will have scheduling give him a call to reschedule.

## 2022-02-12 ENCOUNTER — Telehealth: Payer: Self-pay

## 2022-02-12 DIAGNOSIS — Z955 Presence of coronary angioplasty implant and graft: Secondary | ICD-10-CM

## 2022-02-12 NOTE — Telephone Encounter (Signed)
Attempted to call patient regarding cardiac rehab to follow up as he has been out sick. Has not attended since 8/14. Left message asking for callback.

## 2022-02-12 NOTE — Progress Notes (Deleted)
Cardiology Office Note    Date:  02/12/2022   ID:  DARUS HERSHMAN, DOB 1965-10-12, MRN [id number]  PCP:  [name], MD  Cardiologist:  [name], MD  Electrophysiologist:  None   Chief Complaint: Follow-up  History of Present Illness:   Brett Marshall is a 56 y.o. 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  He previously underwent a diagnostic LHC in 08/2017, which showed mild nonobstructive CAD with an EF of 45 to 50%.  He had mildly elevated pulmonary filling pressures, and has since been treated for sleep apnea.  In 08/2021, he was admitted to the hospital with a 10-day history of constant chest pain that was somewhat reproducible to palpation, though worse with exertion.  Troponins were normal.  Stress testing showed anterior and apical ischemia.  Echo showed an EF of 60 to 65%, no regional wall motion abnormalities, grade 1 diastolic dysfunction, normal RV systolic function and ventricular cavity size, aortic valve sclerosis without evidence of stenosis, and an estimated right atrial pressure of 3 mmHg.  Subsequent diagnostic LHC showed 95% stenosis in the ostial LAD with otherwise nonobstructive disease.  He underwent successful PCI/DES to the LAD with improvement in exertional symptoms.  Cath was performed via the right ulnar approach with subsequent swelling and pain of his right forearm thereafter with paresthesias of the fingers postprocedure.  Ultrasound showed a hematoma within the superficial soft tissues of the right upper extremity, however no evidence of pseudoaneurysm or AV fistula.  He returned to the ED 2 days later due to ongoing right forearm swelling and ecchymosis.  Repeat ultrasound again showed soft tissue hematoma measuring up to 5.1 cm, which was largely unchanged, and he was discharged home.  In hospital follow-up  in 09/2021, he reported recurrent constant, low-level chest tightness that worsened with exertion similar to what he experienced prior to stenting.  He was tachycardic and labs showed mild AKI with a creatinine of 1.41, mild leukocytosis of 13.0, and an elevated D-dimer.  He was referred to the ED for hydration and CTA, which was negative for PE.  He was admitted a day later with refractory angina and underwent repeat diagnostic LHC which showed a patent LAD stent and otherwise nonobstructive disease.  It was noted that he had an LVOT gradient of 50 to 80 mmHg at rest and 7200 he was seen again in the ED on 09/29/2021 with chest pain with unremarkable lab and radiologic findings.  He underwent cardiac MRI on 10/15/2021 which showed normal LV systolic function without evidence for hypertrophic cardiomyopathy or LVOT gradient.  He was last seen in the office on 10/22/2021 and continued to note persistent dyspnea on exertion with minimal activity that he felt was stable.  He was without chest pain.  He reported waking in the middle of the night often gasping for air and was noted to not be adherent to his CPAP, due to side sleeping.  He was referred back to pulmonology for further discussion and treatment of his CPAP.  ***   Labs independently reviewed: 12/2021 - TC 119, TG 156, HDL 40, LDL 48, potassium 4.4, BUN 16, serum creatinine 1.2, albumin 3.5, AST/ALT not elevated, Hgb 13.9, PLT 235, TSH normal, A1c 7.8  Past Medical History:  Diagnosis Date   Asthma    CAD (coronary artery disease)  a. 08/2017 Cath: Diff nonobs dzs, EF 45-50%, diff HK; b. 08/2021 MV: Ant/apical ishemia; c. 08/2021 PCI: LAD 95ost (4.0x15 Onyx Frontier DES), EF 55-65%; c. 09/2021 Cath: LM nl, LAD patent stent, 70m 30d, RI min irregs, LCX min irregs, RCA 50p. LVOT grad 50-823mg (rest), 70-11071m (provoked).   DDD (degenerative disc disease), lumbar    Depression    Diabetes mellitus without complication ([place]  Essential  hypertension, benign    HFimpEF (heart failure with improved ejection fraction) ([place]  a. 08/2017 LV gram: EF 45-50%, glob HK; b. 08/2021 Echo: EF 60-65%, no rwma, GrI DD, nl RV fxn; c. 09/2021 cMRI: EF 60%, no LVOT obstruction/gradient. Mild conc LVH, mild AS. No myocardial scar/fibrosis. No evidence of HCM.   Leg swelling    Left   Low back pain    Mixed ICM & NICM    a. 08/2017 LV gram: EF 45-50%, glob HK; b. 08/2021 Echo: EF 60-65%.   Morbid obesity (HCC)    OSA on CPAP    non compliant   Pulmonary hypertension ([facility]   Past Surgical History:  Procedure Laterality Date   CARDIAC CATHETERIZATION     [place] no stents.   COLONOSCOPY WITH PROPOFOL N/A 04/11/2019   Procedure: COLONOSCOPY WITH PROPOFOL;  Surgeon: WohLucilla LameD;  [facility];  Service: Endoscopy;  Laterality: N/A;   CORONARY STENT INTERVENTION N/A 09/08/2021   Procedure: CORONARY STENT INTERVENTION;  Surgeon: AriWellington HampshireD;  [facility];  Service: Cardiovascular;  Laterality: N/A;   INTRAVASCULAR ULTRASOUND/IVUS N/A 09/08/2021   Procedure: Intravascular Ultrasound/IVUS;  Surgeon: AriWellington HampshireD;  [facility];  Service: Cardiovascular;  Laterality: N/A;   LEFT HEART CATH AND CORONARY ANGIOGRAPHY N/A 09/08/2021   Procedure: LEFT HEART CATH AND CORONARY ANGIOGRAPHY;  Surgeon: AriWellington HampshireD;  [facility];  Service: Cardiovascular;  Laterality: N/A;   LEFT HEART CATH AND CORONARY ANGIOGRAPHY N/A 09/17/2021   Procedure: LEFT HEART CATH AND CORONARY ANGIOGRAPHY;  Surgeon: EndNelva BushD;  [facility];  Service: Cardiovascular;  Laterality: N/A;   RIGHT/LEFT HEART CATH AND CORONARY ANGIOGRAPHY N/A 09/02/2017   Procedure: RIGHT/LEFT HEART CATH AND CORONARY ANGIOGRAPHY;  Surgeon: AriWellington HampshireD;  [facility];  Service: Cardiovascular;  Laterality: N/A;    Current Medications: No outpatient  medications have been marked as taking for the 02/13/22 encounter (Appointment) with DunRise MuA-C.    Allergies:   Patient has no known allergies.   Social History   Socioeconomic History   Marital status: Married    Spouse name: Not on file   Number of children: Not on file   Years of education: Not on file   Highest education level: Not on file  Occupational History   Occupation: comDance movement psychotherapist Employer: [employer]  Tobacco Use   Smoking status: Never   Smokeless tobacco: Never  Vaping Use   Vaping Use: Never used  Substance and Sexual Activity   Alcohol use: Yes    Comment: few times per year/ socially   Drug use: No   Sexual activity: Yes  Other Topics Concern   Not on file  Social History Narrative   Goes to gym.   Social Determinants of Health   Financial Resource Strain: Not on file  Food Insecurity: Not on file  Transportation Needs: Not on file  Physical Activity: Not  on file  Stress: Not on file  Social Connections: Not on file     Family History:  The patient's family history includes Asthma in his mother; Diabetes in his mother; Heart attack in his father; Heart disease in his father; Heart disease (age of onset: 9) in his brother; Sjogren's syndrome in his sister.  ROS:   ROS   EKGs/Labs/Other Studies Reviewed:    Studies reviewed were summarized above. The additional studies were reviewed today:  Cardiac MRI 10/15/2021: IMPRESSION: 1.  Normal LV size and function, LVEF= 60%. 2.  No LVOT obstruction or gradient noted. 3.  Mild concentric LV wall thickness. 4.  Mild aortic valve stenosis. 5.  No evidence for myocardial fibrosis or scar. 6.  No evidence for HCM. __________  [facility] 09/17/2021: Conclusions: Mild-moderate, nonobstructive coronary artery disease similar to the conclusion of last weeks catheterization. Widely patent ostial/proximal LAD stent. Normal left ventricular filling pressure (LVEDP 10-15 mmHg). Significant left  ventricular outflow gradient of up to 80 mmHg at rest and 110 mmHg with provocation (post PVC).  Findings are suspicious for hypertrophic cardiomyopathy.   Recommendations: Continue secondary prevention of coronary artery disease including 12 months of dual antiplatelet therapy with aspirin and ticagrelor from recent PCI to the LAD. Gentle post catheterization hydration with escalation of beta-blockade to help improve LVOT gradient.  Worsening symptoms may be due to a degree of intravascular volume depletion and tachycardia. Consider cardiac MRI for further evaluation of possible hypertrophic cardiomyopathy. __________  [facility] 09/08/2021:   Mid LAD lesion is 20% stenosed.   Dist LAD lesion is 30% stenosed.   Prox RCA lesion is 50% stenosed.   Ost LAD lesion is 95% stenosed.   A drug-eluting stent was successfully placed using a STENT ONYX FRONTIER 4.0X15.   Post intervention, there is a 0% residual stenosis.   The left ventricular systolic function is normal.   LV end diastolic pressure is mildly elevated.   The left ventricular ejection fraction is 55-65% by visual estimate.   1.  Severe one-vessel coronary artery disease with 95% ostial LAD stenosis likely due to plaque rupture given that this area was only 30% on previous cardiac catheterization in 2019.  Moderate proximal RCA disease does not seem to be different from before. 2.  Normal LV systolic function and mildly elevated left ventricular end-diastolic pressure. 3.  Moderate systolic LVOT gradient of 25 to 30 mmHg 4.  Successful IVUS guided PCI and drug-eluting stent placement to the ostial LAD. 5.  The procedure was done via the right ulnar artery given that the radial artery was very small in size.   Recommendations: Dual antiplatelet therapy for at least 12 months. Aggressive treatment of risk factors. __________  [facility] 09/05/2021:   Findings are equivocal. The study is intermediate risk.   No ST deviation was noted.    LV perfusion is abnormal. Defect 1: There is a medium defect with moderate reduction in uptake present in the apical to mid anterior and anterolateral location(s) that is partially reversible. There is normal wall motion in the defect area.   Left ventricular function is normal. End diastolic cavity size is normal. End systolic cavity size is normal.   Prior study not available for comparison.   Body habitus, extracardiac activity affect both stress and rest images, but they appear to be worse on stress images. This was a 2 day study. There is a perfusion defect seen in the mid anterior to apical segments at rest. On stress imaging,  there is a more pronounced perfusion defect in the mid to apical anterior and anterolateral wall. There is normal wall motion in this area. Overall cannot exclude ischemia; given image quality, cannot determine if ischemia vs. Artifact on stress imaging. __________  2D echo 09/05/2021: 1. Left ventricular ejection fraction, by estimation, is 60 to 65%. The  left ventricle has normal function. The left ventricle has no regional  wall motion abnormalities. Left ventricular diastolic parameters are  consistent with Grade I diastolic  dysfunction (impaired relaxation).   2. Right ventricular systolic function is normal. The right ventricular  size is normal.   3. The mitral valve is normal in structure. No evidence of mitral valve  regurgitation. No evidence of mitral stenosis.   4. The aortic valve is normal in structure. Aortic valve regurgitation is  not visualized. Aortic valve sclerosis is present, with no evidence of  aortic valve stenosis.   5. The inferior vena cava is normal in size with greater than 50%  respiratory variability, suggesting right atrial pressure of 3 mmHg.   6. Trivial pericardial effusion is present. __________  2D echo 01/19/2020: 1. Left ventricular ejection fraction, by estimation, is 60 to 65%. The  left ventricle has normal function.  The left ventricle has no regional  wall motion abnormalities. Left ventricular diastolic parameters are  consistent with Grade I diastolic  dysfunction (impaired relaxation).   2. Right ventricular systolic function is normal. The right ventricular  size is normal. Tricuspid regurgitation signal is inadequate for assessing  PA pressure. __________   [facility] 09/02/2017: Ost LAD lesion is 30% stenosed. Mid LAD lesion is 20% stenosed. Dist LAD lesion is 30% stenosed. Prox RCA lesion is 40% stenosed. The left ventricular systolic function is normal. LV end diastolic pressure is normal. The left ventricular ejection fraction is 45-50% by visual estimate.   1.  Mild nonobstructive coronary artery disease. 2.  Mildly reduced LV systolic function with an EF of 45-50% with global hypokinesis. 3.  Right heart catheterization showed moderately elevated filling pressures with a pulmonary wedge pressure of 22 mmHg, mild pulmonary hypertension and high cardiac output.   Recommendations: The patient seems to have mild nonischemic cardiomyopathy with evidence of volume overload.  I am switching hydrochlorothiazide to furosemide and adding carvedilol.  The patient needs to get his sleep apnea treated   EKG:  EKG is ordered today.  The EKG ordered today demonstrates ***  Recent Labs: 09/17/2021: B Natriuretic Peptide 26.4 09/18/2021: Magnesium 1.9 09/29/2021: BUN 24; Creatinine, Ser 1.21; Hemoglobin 14.0; Platelets 247; Potassium 4.3; Sodium 137  Recent Lipid Panel    Component Value Date/Time   CHOL 145 09/07/2021 0924   CHOL 176 12/08/2017 1150   TRIG 113 09/07/2021 0924   HDL 43 09/07/2021 0924   HDL 44 12/08/2017 1150   CHOLHDL 3.4 09/07/2021 0924   VLDL 23 09/07/2021 0924   LDLCALC 79 09/07/2021 0924   LDLCALC 103 (H) 12/08/2017 1150    PHYSICAL EXAM:    VS:  There were no vitals taken for this visit.  BMI: There is no height or weight on file to calculate BMI.  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  Pulmonology note indicates his symptoms have improved some with ICS/LABA inhaler.  Pulmonology considering additional work-up.  Morbid obesity with OSA:   {Are you ordering a CV Procedure (e.g. stress test, cath, DCCV, TEE, etc)?   Press F2        :[id number]}     Disposition: F/u with Dr. Fletcher Anon or an APP in ***.   Medication Adjustments/Labs and Tests Ordered: Current medicines are reviewed at length with the patient today.  Concerns regarding medicines are outlined above. Medication changes, Labs and Tests ordered today are summarized above and listed in the Patient Instructions accessible in Encounters.   SignedChristell Faith, PA-C 02/12/2022 10:02 AM     [place] [address] [place] [address] [place], [place] [address] [phone]

## 2022-02-13 ENCOUNTER — Ambulatory Visit: Payer: BLUE CROSS/BLUE SHIELD | Admitting: Physician Assistant

## 2022-02-16 ENCOUNTER — Encounter: Payer: BLUE CROSS/BLUE SHIELD | Attending: Cardiovascular Disease

## 2022-02-16 DIAGNOSIS — Z955 Presence of coronary angioplasty implant and graft: Secondary | ICD-10-CM | POA: Insufficient documentation

## 2022-02-17 ENCOUNTER — Ambulatory Visit: Payer: BLUE CROSS/BLUE SHIELD

## 2022-02-17 DIAGNOSIS — G4733 Obstructive sleep apnea (adult) (pediatric): Secondary | ICD-10-CM | POA: Diagnosis not present

## 2022-02-18 ENCOUNTER — Encounter: Payer: Self-pay | Admitting: *Deleted

## 2022-02-18 DIAGNOSIS — G4733 Obstructive sleep apnea (adult) (pediatric): Secondary | ICD-10-CM | POA: Diagnosis not present

## 2022-02-18 DIAGNOSIS — Z955 Presence of coronary angioplasty implant and graft: Secondary | ICD-10-CM

## 2022-02-18 NOTE — Progress Notes (Signed)
Cardiac Individual Treatment Plan  Patient Details  Name: Brett Marshall MRN: [id number] Date of Birth: August 17, 1965 Referring Provider:   Flowsheet Row Cardiac Rehab from 10/21/2021 in [facility] Cardiac and Pulmonary Rehab  Referring Provider [name] MD       Initial Encounter Date:  Flowsheet Row Cardiac Rehab from 10/21/2021 in [facility] Cardiac and Pulmonary Rehab  Date 10/21/21       Visit Diagnosis: Status post coronary artery stent placement  Patient's Home Medications on Admission:  Current Outpatient Medications:    albuterol (VENTOLIN HFA) 108 (90 Base) MCG/ACT inhaler, Inhale 1-2 puffs into the lungs every 6 (six) hours as needed for wheezing or shortness of breath. (Patient not taking: Reported on 01/22/2022), Disp: 18 g, Rfl: 1   aspirin EC 81 MG tablet, Take 1 tablet (81 mg total) by mouth daily. Swallow whole., Disp: 30 tablet, Rfl: 6   atorvastatin (LIPITOR) 40 MG tablet, Take 1 tablet (40 mg total) by mouth daily at 6 PM., Disp: 30 tablet, Rfl: 2   BRILINTA 90 MG TABS tablet, Take 1 tablet (90 mg total) by mouth 2 (two) times daily., Disp: 60 tablet, Rfl: 3   cyclobenzaprine (FLEXERIL) 10 MG tablet, Take 1 tablet (10 mg total) by mouth 3 (three) times daily as needed.For spasms, Disp: 90 tablet, Rfl: 5   DULoxetine (CYMBALTA) 30 MG capsule, Take 2 capsules (60 mg total) by mouth daily., Disp: , Rfl:    famotidine (PEPCID) 40 MG tablet, Take 40 mg by mouth daily. , Disp: , Rfl:    fluticasone-salmeterol (ADVAIR) 250-50 MCG/ACT AEPB, Inhale 1 puff into the lungs every 12 (twelve) hours., Disp: 60 each, Rfl: 6   furosemide (LASIX) 20 MG tablet, Take 1 tablet (20 mg total) by mouth daily as needed (for SOB or weight gain of 3 lbs overnight)., Disp: 90 tablet, Rfl: 3   glipiZIDE (GLUCOTROL) 5 MG tablet, Take 1 tablet (5 mg total) by mouth 2 (two) times daily before a meal., Disp: 180 tablet, Rfl: 1   glucose blood (GE100 BLOOD GLUCOSE TEST) test strip, Use as instructed, Disp: 100  each, Rfl: 12   JARDIANCE 25 MG TABS tablet, Take 25 mg by mouth daily., Disp: , Rfl:    Lancets (ONETOUCH ULTRASOFT) lancets, Check sugar twice daily, Disp: 100 each, Rfl: 12   metFORMIN (GLUCOPHAGE) 1000 MG tablet, Take 1 tablet (1,000 mg total) by mouth 2 (two) times daily with a meal., Disp: 180 tablet, Rfl: 1   metoprolol tartrate (LOPRESSOR) 100 MG tablet, Take 1 tablet (100 mg total) by mouth 2 (two) times daily., Disp: 60 tablet, Rfl: 3   traMADol (ULTRAM) 50 MG tablet, Take 1 tablet (50 mg total) by mouth every 6 (six) hours as needed., Disp: 20 tablet, Rfl: 0   traZODone (DESYREL) 50 MG tablet, Take 50-100 mg by mouth at bedtime., Disp: , Rfl:   Past Medical History: Past Medical History:  Diagnosis Date   Asthma    CAD (coronary artery disease)    a. 08/2017 Cath: Diff nonobs dzs, EF 45-50%, diff HK; b. 08/2021 MV: Ant/apical ishemia; c. 08/2021 PCI: LAD 95ost (4.0x15 Onyx Frontier DES), EF 55-65%; c. 09/2021 Cath: LM nl, LAD patent stent, 54m 30d, RI min irregs, LCX min irregs, RCA 50p. LVOT grad 50-866mg (rest), 70-11022m (provoked).   DDD (degenerative disc disease), lumbar    Depression    Di[place] complication [place]  Essential hypertension, benign    HFimpEF (heart failure with improved ejection  fraction) ([place])    a. 08/2017 LV gram: EF 45-50%, glob HK; b. 08/2021 Echo: EF 60-65%, no rwma, GrI DD, nl RV fxn; c. 09/2021 cMRI: EF 60%, no LVOT obstruction/gradient. Mild conc LVH, mild AS. No myocardial scar/fibrosis. No evidence of HCM.   Leg swelling    Left   Low back pain    Mixed ICM & NICM    a. 08/2017 LV gram: EF 45-50%, glob HK; b. 08/2021 Echo: EF 60-65%.   Morbid obesity ([place])    OSA on CPAP    non compliant   Pulmonary hypertension (HCC)     Tobacco Use: Social History   Tobacco Use  Smoking Status Never  Smokeless Tobacco Never    Labs: Review Flowsheet  More data exists      Latest Ref Rng & Units 02/15/2017 05/24/2017 08/23/2017 12/08/2017  09/07/2021  Labs for ITP Cardiac and Pulmonary Rehab  Cholestrol 0 - 200 mg/dL - - - 176  145   LDL (calc) 0 - 99 mg/dL - - - 103  79   HDL-C >40 mg/dL - - - 44  43   Trlycerides <150 mg/dL - - - 143  113   Hemoglobin A1c 4.8 - 5.6 % 8.1  8.1  C 6.6  6.9  7.5     Details      C Corrected result          Exercise Target Goals: Exercise Program Goal: Individual exercise prescription set using results from initial 6 min walk test and THRR while considering  patient's activity barriers and safety.   Exercise Prescription Goal: Initial exercise prescription builds to 30-45 minutes a day of aerobic activity, 2-3 days per week.  Home exercise guidelines will be given to patient during program as part of exercise prescription that the participant will acknowledge.   Education: Aerobic Exercise: - Group verbal and visual presentation on the components of exercise prescription. Introduces F.I.T.T principle from ACSM for exercise prescriptions.  Reviews F.I.T.T. principles of aerobic exercise including progression. Written material given at graduation.   Education: Resistance Exercise: - Group verbal and visual presentation on the components of exercise prescription. Introduces F.I.T.T principle from ACSM for exercise prescriptions  Reviews F.I.T.T. principles of resistance exercise including progression. Written material given at graduation.    Education: Exercise & Equipment Safety: - Individual verbal instruction and demonstration of equipment use and safety with use of the equipment. Flowsheet Row Cardiac Rehab from 12/11/2021 in [facility] Cardiac and Pulmonary Rehab  Date 10/21/21  Educator [facility]  Instruction Review Code 1- Verbalizes Understanding       Education: Exercise Physiology & General Exercise Guidelines: - Group verbal and written instruction with models to review the exercise physiology of the cardiovascular system and associated critical values. Provides general exercise  guidelines with specific guidelines to those with heart or lung disease.  Flowsheet Row Cardiac Rehab from 12/11/2021 in [facility] Cardiac and Pulmonary Rehab  Date 11/13/21  Educator NT  Instruction Review Code 1- [employer] Understanding       Education: Flexibility, Balance, Mind/Body Relaxation: - Group verbal and visual presentation with interactive activity on the components of exercise prescription. Introduces F.I.T.T principle from ACSM for exercise prescriptions. Reviews F.I.T.T. principles of flexibility and balance exercise training including progression. Also discusses the mind body connection.  Reviews various relaxation techniques to help reduce and manage stress (i.e. Deep breathing, progressive muscle relaxation, and visualization). Balance handout provided to take home. Written material given at graduation.   Activity  Barriers & Risk Stratification:  Activity Barriers & Cardiac Risk Stratification - 10/21/21 1028       Activity Barriers & Cardiac Risk Stratification   Activity Barriers Deconditioning;Muscular Weakness;Shortness of Breath;Balance Concerns;Neck/Spine Problems;Joint Problems   DDD in neck and spine, L shoulder limited   Cardiac Risk Stratification Moderate             6 Minute Walk:  6 Minute Walk     Row Name 10/21/21 1027         6 Minute Walk   Phase Initial     Distance 1060 feet     Walk Time 6 minutes     # of Rest Breaks 0     MPH 2     METS 2.16     RPE 14     Perceived Dyspnea  3     VO2 Peak 7.56     Symptoms Yes (comment)     Comments chest tightness 6/10, SOB     Resting HR 70 bpm     Resting BP 124/64     Resting Oxygen Saturation  98 %     Exercise Oxygen Saturation  during 6 min walk 100 %     Max Ex. HR 109 bpm     Max Ex. BP 128/64     2 Minute Post BP 114/66              Oxygen Initial Assessment:   Oxygen Re-Evaluation:   Oxygen Discharge (Final Oxygen Re-Evaluation):   Initial Exercise Prescription:   Initial Exercise Prescription - 10/21/21 1000       Date of Initial Exercise RX and Referring Provider   Date 10/21/21    Referring Provider [name] MD      Oxygen   Maintain Oxygen Saturation 88% or higher      Treadmill   MPH 1.8    Grade 0.5    Minutes 15    METs 2.52      REL-XR   Level 1    Speed 50    Minutes 15    METs 2.5      T5 Nustep   Level 2    SPM 80    Minutes 15    METs 2.5      Biostep-RELP   Level 2    SPM 50    Minutes 15    METs 2      Track   Laps 28    Minutes 15    METs 2.52      Prescription Details   Frequency (times per week) 2    Duration Progress to 30 minutes of continuous aerobic without signs/symptoms of physical distress      Intensity   THRR 40-80% of Max Heartrate 108-145    Ratings of Perceived Exertion 11-13    Perceived Dyspnea 0-4      Progression   Progression Continue to progress workloads to maintain intensity without signs/symptoms of physical distress.      Resistance Training   Training Prescription Yes    Weight 4 lb    Reps 10-15             Perform Capillary Blood Glucose checks as needed.  Exercise Prescription Changes:   Exercise Prescription Changes     Row Name 10/21/21 1000 11/03/21 1600 12/16/21 1400 01/13/22 1500       Response to Exercise   Blood Pressure (Admit) 124/64 110/66 124/72 124/76    Blood  Pressure (Exercise) 128/64 138/68 144/64 148/72    Blood Pressure (Exit) 114/66 108/68 126/70 124/62    Heart Rate (Admit) 70 bpm 72 bpm 85 bpm 87 bpm    Heart Rate (Exercise) 109 bpm 115 bpm 121 bpm 111 bpm    Heart Rate (Exit) 80 bpm 87 bpm 86 bpm 85 bpm    Oxygen Saturation (Admit) 98 % -- -- --    Oxygen Saturation (Exercise) 100 % -- -- --    Rating of Perceived Exertion (Exercise) 14 14 14 16     Perceived Dyspnea (Exercise) 3 -- -- --    Symptoms SOB, chest tightness 6/10 none none none    Comments walk test results 3rd full day of exercise -- --    Duration -- Progress  to 30 minutes of  aerobic without signs/symptoms of physical distress Continue with 30 min of aerobic exercise without signs/symptoms of physical distress. Continue with 30 min of aerobic exercise without signs/symptoms of physical distress.    Intensity -- THRR unchanged THRR unchanged THRR unchanged      Progression   Progression -- Continue to progress workloads to maintain intensity without signs/symptoms of physical distress. Continue to progress workloads to maintain intensity without signs/symptoms of physical distress. Continue to progress workloads to maintain intensity without signs/symptoms of physical distress.    Average METs -- 2.19 2.61 2.61      Resistance Training   Training Prescription -- Yes Yes Yes    Weight -- 4 lb 5 lb 5 lb    Reps -- 10-15 10-15 10-15      Interval Training   Interval Training -- No No No      Treadmill   MPH -- 1.8 -- --    Grade -- 0.5 -- --    Minutes -- 15 -- --    METs -- 2.5 -- --      Recumbant Bike   Level -- -- 4 4    Watts -- -- 63 --    Minutes -- -- 15 15    METs -- -- 2.52 2.52      NuStep   Level -- -- 4 4    Minutes -- -- 15 15    METs -- -- 3.3 2.7      REL-XR   Level -- 1 -- --    Minutes -- 15 -- --    METs -- 1.3 -- --      T5 Nustep   Level -- 2 -- --    Minutes -- 15 -- --    METs -- 2.4 -- --      Biostep-RELP   Level -- 2 4 --    Minutes -- 15 15 --    METs -- 2 2 --      Track   Laps -- -- 30 --    Minutes -- -- 15 --    METs -- -- 2.63 --      Oxygen   Maintain Oxygen Saturation -- 88% or higher 88% or higher 88% or higher             Exercise Comments:   Exercise Comments     Row Name 10/23/21 0800           Exercise Comments First full day of exercise!  Patient was oriented to gym and equipment including functions, settings, policies, and procedures.  Patient's individual exercise prescription and treatment plan were reviewed.  All starting workloads were established based  on the  results of the 6 minute walk test done at initial orientation visit.  The plan for exercise progression was also introduced and progression will be customized based on patient's performance and goals.                Exercise Goals and Review:   Exercise Goals     Row Name 10/21/21 1031             Exercise Goals   Increase Physical Activity Yes       Intervention Provide advice, education, support and counseling about physical activity/exercise needs.;Develop an individualized exercise prescription for aerobic and resistive training based on initial evaluation findings, risk stratification, comorbidities and participant's personal goals.       Expected Outcomes Long Term: Add in home exercise to make exercise part of routine and to increase amount of physical activity.;Short Term: Attend rehab on a regular basis to increase amount of physical activity.;Long Term: Exercising regularly at least 3-5 days a week.       Increase Strength and Stamina Yes       Intervention Provide advice, education, support and counseling about physical activity/exercise needs.;Develop an individualized exercise prescription for aerobic and resistive training based on initial evaluation findings, risk stratification, comorbidities and participant's personal goals.       Expected Outcomes Short Term: Increase workloads from initial exercise prescription for resistance, speed, and METs.;Short Term: Perform resistance training exercises routinely during rehab and add in resistance training at home;Long Term: Improve cardiorespiratory fitness, muscular endurance and strength as measured by increased METs and functional capacity (6MWT)       Able to understand and use rate of perceived exertion (RPE) scale Yes       Intervention Provide education and explanation on how to use RPE scale       Expected Outcomes Short Term: Able to use RPE daily in rehab to express subjective intensity level;Long Term:  Able to use RPE  to guide intensity level when exercising independently       Able to understand and use Dyspnea scale Yes       Intervention Provide education and explanation on how to use Dyspnea scale       Expected Outcomes Short Term: Able to use Dyspnea scale daily in rehab to express subjective sense of shortness of breath during exertion;Long Term: Able to use Dyspnea scale to guide intensity level when exercising independently       Knowledge and understanding of Target Heart Rate Range (THRR) Yes       Intervention Provide education and explanation of THRR including how the numbers were predicted and where they are located for reference       Expected Outcomes Short Term: Able to state/look up THRR;Short Term: Able to use daily as guideline for intensity in rehab;Long Term: Able to use THRR to govern intensity when exercising independently       Able to check pulse independently Yes       Intervention Provide education and demonstration on how to check pulse in carotid and radial arteries.;Review the importance of being able to check your own pulse for safety during independent exercise       Expected Outcomes Short Term: Able to explain why pulse checking is important during independent exercise;Long Term: Able to check pulse independently and accurately       Understanding of Exercise Prescription Yes       Intervention Provide education, explanation, and written materials on patient's  individual exercise prescription       Expected Outcomes Short Term: Able to explain program exercise prescription;Long Term: Able to explain home exercise prescription to exercise independently                Exercise Goals Re-Evaluation :  Exercise Goals Re-Evaluation     Row Name 10/23/21 0800 11/03/21 1619 11/13/21 0732 12/08/21 0725 12/16/21 1422     Exercise Goal Re-Evaluation   Exercise Goals Review Able to understand and use rate of perceived exertion (RPE) scale;Knowledge and understanding of Target Heart  Rate Range (THRR);Understanding of Exercise Prescription;Increase Physical Activity;Increase Strength and Stamina;Able to understand and use Dyspnea scale;Able to check pulse independently Increase Physical Activity;Increase Strength and Stamina;Understanding of Exercise Prescription Increase Physical Activity;Increase Strength and Stamina;Understanding of Exercise Prescription;Able to understand and use rate of perceived exertion (RPE) scale;Able to understand and use Dyspnea scale;Knowledge and understanding of Target Heart Rate Range (THRR);Able to check pulse independently Increase Physical Activity;Increase Strength and Stamina;Understanding of Exercise Prescription Increase Physical Activity;Increase Strength and Stamina;Understanding of Exercise Prescription   Comments Reviewed RPE and dyspnea scales, THR and program prescription with pt today.  Pt voiced understanding and was given a copy of goals to take home. Cleotis is doing well for the first couple of sessions that he has been here. He was able to follow his initial exercise prescription. He had a hypertensive BP response on his first day but was re-checked and back to normal range. We will continue to monitor as he progresses in the program. Reviewed home exercise with pt today.  Pt plans to walk and use staff videos at home for exercise.  Reviewed THR, pulse, RPE, sign and symptoms, pulse oximetery and when to call 911 or MD.  Also discussed weather considerations and indoor options.  Pt voiced understanding.  Mishael mentioned today that he is having chest tightness today like pressure on his chest.  It started early this morning.  He was encourged to call his doctor. Patient reports that he has incorporated some exercise at home on off days of rehab. He comes to rehab 2 times a week and has added 2-3 days of walking at home. He reported that he does want to start progressing at home and do more walking as he continues to get stronger. He has  communicated with his doctor about his chest pressure and he is activly monitoring his symptoms. Estus has been doing well in rehab. He recently improved his overall average METs to 2.61 METs. He also increased from 28 to 30 laps on the track. Coree improved to level 4 on the Biostep as well. He also began using 5 lb hand weights for resistance training as well. We will continue to monitor his progress in the program.   Expected Outcomes Short: Use RPE daily to regulate intensity. Long: Follow program prescription in THR. Short: Follow current exercise prescription Long: Increase overall strength and MET level Short: Call doctor about chest pressure Long: Conitnue to exercise independently Short: continue to monitor chest pressure and be in communication with his doctor about any changes or concerns. Continue to progresss with home exercise as tolerated without symptoms. Long: continue to exercise independently. Short: Continue to push for more laps on the track. Long: Continue to increase strength and stamina.    [place] Name 12/29/21 0725 01/13/22 1509 01/27/22 1444         Exercise Goal Re-Evaluation   Exercise Goals Review Increase Physical Activity;Increase Strength and Stamina;Understanding of Exercise Prescription  Increase Physical Activity;Increase Strength and Stamina;Understanding of Exercise Prescription --     Comments Curren was out last week with a stomach bug.  He was not able to exercise while he was out.  He is walking some outside on his off days.  He will go up and down street.  He was starting to feel like his strength and stamina were recovering and now feels like being sick has definitely set him back some.  He wants to rebuild his stamina again. Thijs has only attended rehab once since the last evaluation. His overall average MET level has been consistent at 2.61 METs. He has also done well with level 4 on the recumbent bike and T4. He has tolerated using 5 lb hand weights for resistance  training. We will continue to monitor his progress in the program. Out since last review.  Last attended on 12/29/21.     Expected Outcomes Short: Get back to regular exercise Long: Continue to improve stamina Short: Attend rehab regularly. Long: Continue to improve strength and stamina --              Discharge Exercise Prescription (Final Exercise Prescription Changes):  Exercise Prescription Changes - 01/13/22 1500       Response to Exercise   Blood Pressure (Admit) 124/76    Blood Pressure (Exercise) 148/72    Blood Pressure (Exit) 124/62    Heart Rate (Admit) 87 bpm    Heart Rate (Exercise) 111 bpm    Heart Rate (Exit) 85 bpm    Rating of Perceived Exertion (Exercise) 16    Symptoms none    Duration Continue with 30 min of aerobic exercise without signs/symptoms of physical distress.    Intensity THRR unchanged      Progression   Progression Continue to progress workloads to maintain intensity without signs/symptoms of physical distress.    Average METs 2.61      Resistance Training   Training Prescription Yes    Weight 5 lb    Reps 10-15      Interval Training   Interval Training No      Recumbant Bike   Level 4    Minutes 15    METs 2.52      NuStep   Level 4    Minutes 15    METs 2.7      Oxygen   Maintain Oxygen Saturation 88% or higher             Nutrition:  Target Goals: Understanding of nutrition guidelines, daily intake of sodium <1554m, cholesterol <2075m calories 30% from fat and 7% or less from saturated fats, daily to have 5 or more servings of fruits and vegetables.  Education: All About Nutrition: -Group instruction provided by verbal, written material, interactive activities, discussions, models, and posters to present general guidelines for heart healthy nutrition including fat, fiber, MyPlate, the role of sodium in heart healthy nutrition, utilization of the nutrition label, and utilization of this knowledge for meal planning. Follow  up email sent as well. Written material given at graduation. [facility]  Education need identified 10/21/21  Date 12/11/21  Educator MCEast ButlerInstruction Review Code 1- Verbalizes Understanding       Biometrics:  Pre Biometrics - 10/21/21 1031       Pre Biometrics   Height 5' 9.5" (1.765 m)    Weight 329 lb 8 oz (149.5 kg)    BMI (Calculated) 47.98  Single Leg Stand 3.5 seconds              Nutrition Therapy Plan and Nutrition Goals:  Nutrition Therapy & Goals - 11/11/21 1329       Nutrition Therapy   Diet Heart Healthy, T2DM MNT    Drug/Food Interactions Statins/Certain Fruits    Protein (specify units) 80g    Fiber 30 grams    Whole Grain Foods 3 servings    Saturated Fats 16 max. grams    Fruits and Vegetables 8 servings/day    Sodium 2 grams      Personal Nutrition Goals   Nutrition Goal ST: swap out some grains with whole grains - try whole grain bread, brainstorm easy to prep meals  LT: Limit eating out <3x/week, follow MyPlate guidelines, limit Na <2g    Comments 56 y.o. M admitted to cardiac rehab s/p coronary artery stent placement. PMHx includes HTN, HLD, chronic systolic congestive heart failure, mixed ischemic and nonischemic cardiomyopathy, CAD, T2DM, GERD. Relevant medications include lipitor, cymbalta, pepcid, lasix, glipizide, jardiance, metformin, tramadol, trazodone. PYP Score: 53. Vegetables & Fruits 6/12. Breads, Grains & Cereals 6/12. Red & Processed Meat 8/12. Poultry 0/2. Fish & Shellfish 0/4. B: Bagel (cinnamon raisin - thomas's) with cream cheese or toast (italian white bread with peanut butter) and sometimes bacon, egg and cheese biscuit - with unsweetened tea or OJ S: granola bar with peanuts in it or some fruit L: sandwich ([place] with mayo and american cheese on white bread) or chicken sandwich (roll or bun) or sometimes pizza on the weekend or sometimes house salad ([place] or  caesar). S: usually no snack, but if he does he will have sun chips (he likes the crunch factor). D: house salad, door dash (salads from [employer] with chicken, hamburger, hotdog, steak, pizza, spaghetti, [place] food, [place] (brown rice with order)).  S: sun chips sometimes. Drinks: orange soda with no added sugar or gaterade sometimes, not usually water. He will occasionally get heartburn. Discussed general heart healthy eating and diabetes friendly eating.      Intervention Plan   Intervention Prescribe, educate and counsel regarding individualized specific dietary modifications aiming towards targeted core components such as weight, hypertension, lipid management, diabetes, heart failure and other comorbidities.    Expected Outcomes Long Term Goal: Adherence to prescribed nutrition plan.;Short Term Goal: A plan has been developed with personal nutrition goals set during dietitian appointment.;Short Term Goal: Understand basic principles of dietary content, such as calories, fat, sodium, cholesterol and nutrients.             Nutrition Assessments:  MEDIFICTS Score Key: ?70 Need to make dietary changes  40-70 Heart Healthy Diet ? 40 Therapeutic Level Cholesterol Diet  Flowsheet Row Cardiac Rehab from 10/21/2021 in [facility] Cardiac and Pulmonary Rehab  Picture Your Plate Total Score on Admission 53      Picture Your Plate Scores: <30 Unhealthy dietary pattern with much room for improvement. 41-50 Dietary pattern unlikely to meet recommendations for good health and room for improvement. 51-60 More healthful dietary pattern, with some room for improvement.  >60 Healthy dietary pattern, although there may be some specific behaviors that could be improved.    Nutrition Goals Re-Evaluation:  Nutrition Goals Re-Evaluation     [place] Name 12/08/21 0735 12/29/21 0731           Goals   Nutrition Goal ST: swap out some grains with whole grains - try whole grain bread, brainstorm easy to  prep  meals  LT: Limit eating out <3x/week, follow MyPlate guidelines, limit Na <2g Short: work on finding easy meal prep ideas. Long: help control cardiac risk factors with heart healthy eating.      Comment Patient reports that he has been trying to switch from refined grains to whole grains, and has been reducing the amount of sodium consumption. He is incorporating more fruits and vegetables into his diet and drinking more water instead of other drinks. He still needs to work on brainstorming for easy meal prep ideas. Luay is doing well with his diet.  Last week he had stomach bug and couldn't eat a whole lot.  But, prior to that, he was starting to work on meal prep.  He continues to try to find new options and plans ahead.  He is trying to get in more whole grains still.  He has not cut back on eating out eat, but he is trying to make more healthier options now.  He is trying to read labels more and is more aware of it.      Expected Outcome Short: work on finding easy meal prep ideas. Long: help control cardiac risk factors with heart healthy eating. Short: Continue to cut back on eating out Long: Conitnue to improve diet               Nutrition Goals Discharge (Final Nutrition Goals Re-Evaluation):  Nutrition Goals Re-Evaluation - 12/29/21 0731       Goals   Nutrition Goal Short: work on finding easy meal prep ideas. Long: help control cardiac risk factors with heart healthy eating.    Comment Kongmeng is doing well with his diet.  Last week he had stomach bug and couldn't eat a whole lot.  But, prior to that, he was starting to work on meal prep.  He continues to try to find new options and plans ahead.  He is trying to get in more whole grains still.  He has not cut back on eating out eat, but he is trying to make more healthier options now.  He is trying to read labels more and is more aware of it.    Expected Outcome Short: Continue to cut back on eating out Long: Conitnue to improve diet              Psychosocial: Target Goals: Acknowledge presence or absence of significant depression and/or stress, maximize coping skills, provide positive support system. Participant is able to verbalize types and ability to use techniques and skills needed for reducing stress and depression.   Education: Stress, Anxiety, and Depression - Group verbal and visual presentation to define topics covered.  Reviews how body is impacted by stress, anxiety, and depression.  Also discusses healthy ways to reduce stress and to treat/manage anxiety and depression.  Written material given at graduation. Flowsheet Row Cardiac Rehab from 12/11/2021 in [facility] Cardiac and Pulmonary Rehab  Date 11/06/21  Educator [facility]  Instruction Review Code 1- [employer] Understanding       Education: Sleep Hygiene -Provides group verbal and written instruction about how sleep can affect your health.  Define sleep hygiene, discuss sleep cycles and impact of sleep habits. Review good sleep hygiene tips.    Initial Review & Psychosocial Screening:  Initial Psych Review & Screening - 10/08/21 1343       Initial Review   Current issues with None Identified      Family Dynamics   Good Support System? Yes   Wife,friends, church  friends     Barriers   Psychosocial barriers to participate in program There are no identifiable barriers or psychosocial needs.      Screening Interventions   Interventions Encouraged to exercise;To provide support and resources with identified psychosocial needs;Provide feedback about the scores to participant    Expected Outcomes Short Term goal: Utilizing psychosocial counselor, staff and physician to assist with identification of specific Stressors or current issues interfering with healing process. Setting desired goal for each stressor or current issue identified.;Long Term Goal: Stressors or current issues are controlled or eliminated.;Short Term goal: Identification and review with  participant of any Quality of Life or Depression concerns found by scoring the questionnaire.;Long Term goal: The participant improves quality of Life and PHQ9 Scores as seen by post scores and/or verbalization of changes             Quality of Life Scores:   Quality of Life - 10/21/21 1032       Quality of Life   Select Quality of Life      Quality of Life Scores   Health/Function Pre 12.2 %    Socioeconomic Pre 22.93 %    Psych/Spiritual Pre 16.29 %    Family Pre 22.5 %    GLOBAL Pre 16.59 %            Scores of 19 and below usually indicate a poorer quality of life in these areas.  A difference of  2-3 points is a clinically meaningful difference.  A difference of 2-3 points in the total score of the Quality of Life Index has been associated with significant improvement in overall quality of life, self-image, physical symptoms, and general health in studies assessing change in quality of life.  PHQ-9: Review Flowsheet  More data exists      12/29/2021 12/08/2021 11/13/2021 10/21/2021 12/08/2017  Depression screen PHQ 2/9  Decreased Interest 0 0 1 2 0  Down, Depressed, Hopeless 0 1 0 0 0  PHQ - 2 Score 0 1 1 2  0  Altered sleeping 1 2 2 2  -  Tired, decreased energy 2 2 2 3  -  Change in appetite 0 0 1 2 -  Feeling bad or failure about yourself  0 0 0 0 -  Trouble concentrating 0 1 0 0 -  Moving slowly or fidgety/restless 0 1 0 1 -  Suicidal thoughts 0 0 0 0 -  PHQ-9 Score 3 7 6 10  -  Difficult doing work/chores Somewhat difficult Somewhat difficult Somewhat difficult Somewhat difficult -   Interpretation of Total Score  Total Score Depression Severity:  1-4 = Minimal depression, 5-9 = Mild depression, 10-14 = Moderate depression, 15-19 = Moderately severe depression, 20-27 = Severe depression   Psychosocial Evaluation and Intervention:  Psychosocial Evaluation - 10/08/21 1401       Psychosocial Evaluation & Interventions   Interventions Encouraged to exercise with  the program and follow exercise prescription    Comments Joron has no barriers to attending the program. He lives with his wife,. His wife, friends and church friends are his support team. Mumin is working on weight loss. He has lost over 100 lbs and is continuing the weight loss journey. He wants to learn how to manage an exercise program and to continue with his weight loss. HIs long term goal is to reduce the anmount of medicine he is taking.    Expected Outcomes STG Loy is able to attend all scheduled sessions, he works with EP and  RD to continue his weight loss journey, and he manages to progress to a routine exercise program LTG Andrej continues to progress with exercise and weight loss after discharge. He is able to in the longterm to decrease his medication needs    Continue Psychosocial Services  Follow up required by staff             Psychosocial Re-Evaluation:  Psychosocial Re-Evaluation     [place] Name 11/13/21 0735 12/08/21 0732 12/08/21 0738 12/29/21 0727       Psychosocial Re-Evaluation   Current issues with Current Stress Concerns;Current Sleep Concerns Current Stress Concerns;Current Sleep Concerns Current Stress Concerns;Current Sleep Concerns Current Stress Concerns;Current Sleep Concerns    Comments Samantha's PHQ has improved to 6 down from 10.  He is still having difficulty with sleep and energy levels.  He is also having chest tightness today.  He was encouraged to talk to the doctor about chest tightness.  He has not had a recent sleep study but will see a pulmonologist in August and wants to get a new CPAP mask as his does not stay on very well.  Thus his CPAP compliance is not the greatest as it usually falls off during the night.  These are his biggest sources of stressors.  He has noticed the bruising popping up from his blood thinner that sometimes catches him off guard. -- Chad's PHQ follow up was 7. This was slightly higher then last time but still lower then when he  started. He does feel like he is doing better mentally then when he started the program. He still reports some sleep trouble but nothing new and has an appointment with the company to talk about his new CPAP machine scheduled for august. He has talked to his doctor about his chest tightness and  he is monitoring it and in communication with his doctor about that. Mart is doing well in rehab.  He was out last week with a stomach bug.  He was frustrated to feel held back with it.  Despite being sick, his PHQ has improved to a 3!!  He is his still struggling with sleep.  He wakes freqently at night and sometimes struggles to get back to sleep, but sometimes not able to return to sleep.  He has his fitting for a new CPAP this Friday.  Overall, he is feeling better.    Expected Outcomes Short: Talk to doctor about chest tightness Long: Continue to work on sleep -- Short: have appointment with CPAP company to get new machine. Long: Maintain godd mental health habits. Short: Start wearing CPAP again after appt Long: Continue to exercise for mental boost.    Interventions Encouraged to attend Cardiac Rehabilitation for the exercise -- Encouraged to attend Cardiac Rehabilitation for the exercise Encouraged to attend Cardiac Rehabilitation for the exercise;Stress management education    Continue Psychosocial Services  Follow up required by staff -- Follow up required by staff Follow up required by staff             Psychosocial Discharge (Final Psychosocial Re-Evaluation):  Psychosocial Re-Evaluation - 12/29/21 0727       Psychosocial Re-Evaluation   Current issues with Current Stress Concerns;Current Sleep Concerns    Comments Leaman is doing well in rehab.  He was out last week with a stomach bug.  He was frustrated to feel held back with it.  Despite being sick, his PHQ has improved to a 3!!  He is his still struggling with sleep.  He wakes freqently at night and sometimes struggles to get back to sleep,  but sometimes not able to return to sleep.  He has his fitting for a new CPAP this Friday.  Overall, he is feeling better.    Expected Outcomes Short: Start wearing CPAP again after appt Long: Continue to exercise for mental boost.    Interventions Encouraged to attend Cardiac Rehabilitation for the exercise;Stress management education    Continue Psychosocial Services  Follow up required by staff             Vocational Rehabilitation: Provide vocational rehab assistance to qualifying candidates.   Vocational Rehab Evaluation & Intervention:   Education: Education Goals: Education classes will be provided on a variety of topics geared toward better understanding of heart health and risk factor modification. Participant will state understanding/return demonstration of topics presented as noted by education test scores.  Learning Barriers/Preferences:   General Cardiac Education Topics:  AED/CPR: - Group verbal and written instruction with the use of models to demonstrate the basic use of the AED with the basic ABC's of resuscitation.   Anatomy and Cardiac Procedures: - Group verbal and visual presentation and models provide information about basic cardiac anatomy and function. Reviews the testing methods done to diagnose heart disease and the outcomes of the test results. Describes the treatment choices: Medical Management, Angioplasty, or Coronary Bypass Surgery for treating various heart conditions including Myocardial Infarction, Angina, Valve Disease, and Cardiac Arrhythmias.  Written material given at graduation. Flowsheet Row Cardiac Rehab from 12/11/2021 in [facility] Cardiac and Pulmonary Rehab  Education need identified 10/21/21  Date 11/27/21  Educator SB  Instruction Review Code 1- Verbalizes Understanding       Medication Safety: - Group verbal and visual instruction to review commonly prescribed medications for heart and lung disease. Reviews the medication, class of the  drug, and side effects. Includes the steps to properly store meds and maintain the prescription regimen.  Written material given at graduation.   Intimacy: - Group verbal instruction through game format to discuss how heart and lung disease can affect sexual intimacy. Written material given at graduation..   Know Your Numbers and Heart Failure: - Group verbal and visual instruction to discuss disease risk factors for cardiac and pulmonary disease and treatment options.  Reviews associated critical values for Overweight/Obesity, Hypertension, Cholesterol, and Diabetes.  Discusses basics of heart failure: signs/symptoms and treatments.  Introduces Heart Failure Zone chart for action plan for heart failure.  Written material given at graduation.   Infection Prevention: - Provides verbal and written material to individual with discussion of infection control including proper hand washing and proper equipment cleaning during exercise session. Flowsheet Row Cardiac Rehab from 12/11/2021 in [facility] Cardiac and Pulmonary Rehab  Date 10/21/21  Educator [facility]  Instruction Review Code 1- Verbalizes Understanding       Falls Prevention: - Provides verbal and written material to individual with discussion of falls prevention and safety. Flowsheet Row Cardiac Rehab from 12/11/2021 in [facility] Cardiac and Pulmonary Rehab  Date 10/08/21  Educator SB  Instruction Review Code 1- Verbalizes Understanding       Other: -Provides group and verbal instruction on various topics (see comments)   Knowledge Questionnaire Score:  Knowledge Questionnaire Score - 10/21/21 1033       Knowledge Questionnaire Score   Pre Score 24/26             Core Components/Risk Factors/Patient Goals at Admission:  Personal Goals and Risk Factors at  Admission - 10/21/21 1033       Core Components/Risk Factors/Patient Goals on Admission    Weight Management Yes;Obesity;Weight Loss    Intervention Weight Management:  Develop a combined nutrition and exercise program designed to reach desired caloric intake, while maintaining appropriate intake of nutrient and fiber, sodium and fats, and appropriate energy expenditure required for the weight goal.;Weight Management/Obesity: Establish reasonable short term and long term weight goals.;Obesity: Provide education and appropriate resources to help participant work on and attain dietary goals.;Weight Management: Provide education and appropriate resources to help participant work on and attain dietary goals.    Admit Weight 329 lb 8 oz (149.5 kg)    Goal Weight: Short Term 324 lb (147 kg)    Goal Weight: Long Term 225 lb (102.1 kg)    Expected Outcomes Short Term: Continue to assess and modify interventions until short term weight is achieved;Long Term: Adherence to nutrition and physical activity/exercise program aimed toward attainment of established weight goal;Weight Loss: Understanding of general recommendations for a balanced deficit meal plan, which promotes 1-2 lb weight loss per week and includes a negative energy balance of [id number] kcal/d;Understanding recommendations for meals to include 15-35% energy as protein, 25-35% energy from fat, 35-60% energy from carbohydrates, less than 254m of dietary cholesterol, 20-35 gm of total fiber daily;Understanding of distribution of calorie intake throughout the day with the consumption of 4-5 meals/snacks    Diabetes Yes    Intervention Provide education about signs/symptoms and action to take for hypo/hyperglycemia.;Provide education about proper nutrition, including hydration, and aerobic/resistive exercise prescription along with prescribed medications to achieve blood glucose in normal ranges: Fasting glucose 65-99 mg/dL    Expected Outcomes Short Term: Participant verbalizes understanding of the signs/symptoms and immediate care of hyper/hypoglycemia, proper foot care and importance of medication, aerobic/resistive  exercise and nutrition plan for blood glucose control.;Long Term: Attainment of HbA1C < 7%.    Heart Failure Yes    Intervention Provide a combined exercise and nutrition program that is supplemented with education, support and counseling about heart failure. Directed toward relieving symptoms such as shortness of breath, decreased exercise tolerance, and extremity edema.    Expected Outcomes Short term: Attendance in program 2-3 days a week with increased exercise capacity. Reported lower sodium intake. Reported increased fruit and vegetable intake. Reports medication compliance.;Improve functional capacity of life;Short term: Daily weights obtained and reported for increase. Utilizing diuretic protocols set by physician.;Long term: Adoption of self-care skills and reduction of barriers for early signs and symptoms recognition and intervention leading to self-care maintenance.    Hypertension Yes    Intervention Provide education on lifestyle modifcations including regular physical activity/exercise, weight management, moderate sodium restriction and increased consumption of fresh fruit, vegetables, and low fat dairy, alcohol moderation, and smoking cessation.;Monitor prescription use compliance.    Expected Outcomes Short Term: Continued assessment and intervention until BP is < 140/951mHG in hypertensive participants. < 130/8069mG in hypertensive participants with diabetes, heart failure or chronic kidney disease.;Long Term: Maintenance of blood pressure at goal levels.    Lipids Yes    Intervention Provide education and support for participant on nutrition & aerobic/resistive exercise along with prescribed medications to achieve LDL <72m16mDL >40mg24m Expected Outcomes Short Term: Participant states understanding of desired cholesterol values and is compliant with medications prescribed. Participant is following exercise prescription and nutrition guidelines.;Long Term: Cholesterol controlled with  medications as prescribed, with individualized exercise RX and with personalized nutrition plan. Value goals: LDL <  41m, HDL > 40 mg.             Education:Diabetes - Individual verbal and written instruction to review signs/symptoms of diabetes, desired ranges of glucose level fasting, after meals and with exercise. Acknowledge that pre and post exercise glucose checks will be done for 3 sessions at entry of program. [facility]  Date 10/21/21  [facility] Instruction Review Code 1- Verbalizes Understanding       Core Components/Risk Factors/Patient Goals Review:   Goals and Risk Factor Review     Row Name 11/13/21 0741 12/08/21 0727 12/29/21 0733         Core Components/Risk Factors/Patient Goals Review   Personal Goals Review Weight Management/Obesity;Hypertension;Diabetes;Lipids Weight Management/Obesity;Hypertension;Diabetes;Lipids Weight Management/Obesity;Hypertension;Diabetes;Lipids     Review SMarseanis doing well in rehab.  His weight has crept up some to 333 lb today.  He is not eating more and just met with dietitian this week.  We also reviewed home exercise today, so adding that it will help with calorie burn.  His pressures are doing well and checks them at home with a wrist monitor.  He also checks his sugars reguarly at home, today they were 140 and stay around that range.  He has been having chest pain and was encouraged to stop by office to talk to nurse or doctor. Patient reports taking all medication as prescribed. He monitors his blood glucose and blood pressure at home and reports them to be in acceptable ranges. His weight as had ups and downs but her reports that he does not have fluid retention problems. SDenveris doing well in rehab. His weight is staying pretty steady around 343 lb.  He is still trying to lose more.  His sugars are running in the 140s now.  This is an improvement from the 170-180s  like when he started.  He will be getting his A1c checked on Friday.  He is doing well with his pressures and continues to check them as well.     Expected Outcomes Short: Continue to work on weight loss Long: Continue to monitor risk factors Short: Continue to work on weight loss Long: Continue to monitor risk factors Short: Get updated A1c  Long: Conitnue to work on wLockheed Martinloss              Core Components/Risk Factors/Patient Goals at Discharge (Final Review):   Goals and Risk Factor Review - 12/29/21 0733       Core Components/Risk Factors/Patient Goals Review   Personal Goals Review Weight Management/Obesity;Hypertension;Diabetes;Lipids    Review SDaleyis doing well in rehab. His weight is staying pretty steady around 343 lb.  He is still trying to lose more.  His sugars are running in the 140s now.  This is an improvement from the 170-180s like when he started.  He will be getting his A1c checked on Friday.  He is doing well with his pressures and continues to check them as well.    Expected Outcomes Short: Get updated A1c  Long: Conitnue to work on weight loss             ITP Comments:  ITP Comments     Row Name 10/08/21 1401 10/21/21 1027 10/23/21 0800 10/29/21 1258 11/11/21 1422   ITP Comments Virtual orientation call completed today. he has an appointment on Date: 10/21/2021  for EP eval and gym Orientation.  Documentation of diagnosis can be found  in [facility] Date: 09/04/2021 . Completed 6MWT and gym orientation. Initial ITP created and sent for review to Dr. Emily Filbert, Medical Director. First full day of exercise!  Patient was oriented to gym and equipment including functions, settings, policies, and procedures.  Patient's individual exercise prescription and treatment plan were reviewed.  All starting workloads were established based on the results of the 6 minute walk test done at initial orientation visit.  The plan for exercise progression was also introduced and progression  will be customized based on patient's performance and goals. 30 Day review completed. Medical Director ITP review done, changes made as directed, and signed approval by Medical Director. Completed initial RD consultation    Row Name 11/26/21 0942 12/24/21 0743 01/21/22 1329 01/27/22 1443 02/12/22 1129   ITP Comments 30 Day review completed. Medical Director ITP review done, changes made as directed, and signed approval by Medical Director. 30 Day review completed. Medical Director ITP review done, changes made as directed, and signed approval by Medical Director. 30 Day review completed. Medical Director ITP review done, changes made as directed, and signed approval by Medical Director. Pt has been out since 12/29/21.  He has been out with stomach bug and upper respiratory infection. Attempted to call patient regarding cardiac rehab to follow up as he has been out sick. Has not attended since 8/14. Left message asking for callback.    [place] Name 02/18/22 0728           ITP Comments 30 Day review completed. Medical Director ITP review done, changes made as directed, and signed approval by Medical Director.   Remains out                Comments:

## 2022-02-18 NOTE — Progress Notes (Unsigned)
Cardiology Office Note:    Date:  02/19/2022   ID:  Brett Marshall, DOB August 01, 1965, MRN [id number]  PCP:  [name], MD   [place] Providers Cardiologist:  [name], MD     Referring MD: [name], MD   Chief Complaint  Patient presents with   Follow-up    3 month follow up. Patient states that he is doing well today. Meds reviewed with patient.     History of Present Illness:    Brett Marshall is a 56 y.o. male with a hx of the following:  CAD, Hx of unstable angina HTN Chronic systolic CHF, with improved EF Pulmonary HTN OSA on CPAP GERD NASH T2DM Obesity   Previous history of LV outflow tract gradient on catheterization.  Underwent diagnostic cath in 2019 which revealed mild, nonobstructive CAD, EF approximately 45 to 50%.  He also had elevated pulmonary pressures and has been treated for OSA.  In 2023 he was admitted to [facility] with previous 10-day history of CP, somewhat reproducible with palpation but worse with exertion.  Stress testing revealed anterior and apical ischemia.  Troponins normal.  Cath revealed 95% stenosis in ostial LAD, otherwise normal.  Received DES to LAD.  Post-cath he did have swelling and pain of the right forearm with paresthesias of fingers on his right hand.  Hematoma was diagnosed within the superficial soft tissues of right upper extremity, no evidence of AV fistula or pseudoaneurysm.  2 days later, he returned to the ED complaining of ongoing swelling in his right forearm and bruising.  Ultrasound showed soft tissue hematoma, 1.5 cm, largely unchanged and was discharged home.  At follow-up appointment he reported recurrent exertional chest tightness, low-level and similar to what he experienced before.  Labs revealed AKI he was tachycardic, had an elevated D-dimer, and mild leukocytosis.  Was referred to ED and CTA was negative for PE.  Received some hydration.  Admitted 1 day later with refractory angina and cath  revealed patent LAD stent, otherwise no obstructive disease.  LVOT gradient noted around 50 to 80 mmHg at rest, 7200 mmHg with provocation.  Reported to ED on May 15 for chest pain.  Radiological tests and labs were unremarkable.  Underwent cardiac MRI later that month that showed normal LV function, no evidence of hypertrophic cardiomyopathy or LVOT gradient.  Last seen by Ignacia Bayley, NP in the office on 10/22/2021. Reported persistent DOE with minimal activity, overall stable. Denied any CP. Reported awaking in night gasping for air. Overall denied any other cardiac complaints or concerns. Was going to start cardiac rehab the next day. If symptoms continued, would consider long acting nitrate therapy.   Today he presents for follow-up. He is doing well. Says he has had to recently stop cardiac rehab d/t loose bowel movements that has been occurring for the past 2 weeks. Not sure what is causing this. Denies any fever, chills, N/V. Says he is due for repeat colonoscopy. Overall he is doing well from a cardiac perspective. Initially had some CP when starting cardiac rehab, but soon resolved after continuing cardiac rehab. Denies any recent CP. Breathing has improved. Says when he was doing cardiac rehab, he was able to go faster in speed and able to tolerate heavier weights. BP well controlled at home. Denies any palpitations, syncope, presyncope, dizziness, orthopnea, PND, significant weight changes or significant swelling, claudication, or bleeding.  Says he has not been wearing his CPAP lately because the mask is not  fitting appropriately, he is getting this fixed.  Denies any other questions or concerns.   Past Medical History:  Diagnosis Date   Asthma    CAD (coronary artery disease)    a. 08/2017 Cath: Diff nonobs dzs, EF 45-50%, diff HK; b. 08/2021 MV: Ant/apical ishemia; c. 08/2021 PCI: LAD 95ost (4.0x15 Onyx Frontier DES), EF 55-65%; c. 09/2021 Cath: LM nl, LAD patent stent, 78m 30d, RI min irregs,  LCX min irregs, RCA 50p. LVOT grad 50-83mg (rest), 70-11079m (provoked).   DDD (degenerative disc disease), lumbar    Depression    Diabetes mellitus without complication ([facility]  Essential hypertension, benign    HFimpEF (heart failure with improved ejection fraction) ([facility]  a. 08/2017 LV gram: EF 45-50%, glob HK; b. 08/2021 Echo: EF 60-65%, no rwma, GrI DD, nl RV fxn; c. 09/2021 cMRI: EF 60%, no LVOT obstruction/gradient. Mild conc LVH, mild AS. No myocardial scar/fibrosis. No evidence of HCM.   Leg swelling    Left   Low back pain    Mixed ICM & NICM    a. 08/2017 LV gram: EF 45-50%, glob HK; b. 08/2021 Echo: EF 60-65%.   Morbid obesity (HCC)    OSA on CPAP    non compliant   Pulmonary hypertension ([facility]   Past Surgical History:  Procedure Laterality Date   CARDIAC CATHETERIZATION     [place] no stents.   COLONOSCOPY WITH PROPOFOL N/A 04/11/2019   Procedure: COLONOSCOPY WITH PROPOFOL;  Surgeon: WohLucilla LameD;  [facility];  Service: Endoscopy;  Laterality: N/A;   CORONARY STENT INTERVENTION N/A 09/08/2021   Procedure: CORONARY STENT INTERVENTION;  Surgeon: AriWellington HampshireD;  [facility];  Service: Cardiovascular;  Laterality: N/A;   INTRAVASCULAR ULTRASOUND/IVUS N/A 09/08/2021   Procedure: Intravascular Ultrasound/IVUS;  Surgeon: AriWellington HampshireD;  [facility];  Service: Cardiovascular;  Laterality: N/A;   LEFT HEART CATH AND CORONARY ANGIOGRAPHY N/A 09/08/2021   Procedure: LEFT HEART CATH AND CORONARY ANGIOGRAPHY;  Surgeon: AriWellington HampshireD;  [facility];  Service: Cardiovascular;  Laterality: N/A;   LEFT HEART CATH AND CORONARY ANGIOGRAPHY N/A 09/17/2021   Procedure: LEFT HEART CATH AND CORONARY ANGIOGRAPHY;  Surgeon: EndNelva BushD;  [facility];  Service: Cardiovascular;  Laterality: N/A;   RIGHT/LEFT HEART CATH AND CORONARY ANGIOGRAPHY N/A 09/02/2017   Procedure:  RIGHT/LEFT HEART CATH AND CORONARY ANGIOGRAPHY;  Surgeon: AriWellington HampshireD;  [facility];  Service: Cardiovascular;  Laterality: N/A;    Current Medications: Current Meds  Medication Sig   albuterol (VENTOLIN HFA) 108 (90 Base) MCG/ACT inhaler Inhale 1-2 puffs into the lungs every 6 (six) hours as needed for wheezing or shortness of breath.   aspirin EC 81 MG tablet Take 1 tablet (81 mg total) by mouth daily. Swallow whole.   atorvastatin (LIPITOR) 40 MG tablet Take 1 tablet (40 mg total) by mouth daily at 6 PM.   BRILINTA 90 MG TABS tablet Take 1 tablet (90 mg total) by mouth 2 (two) times daily.   cyclobenzaprine (FLEXERIL) 10 MG tablet Take 1 tablet (10 mg total) by mouth 3 (three) times daily as needed.For spasms   DULoxetine (CYMBALTA) 30 MG capsule Take 2 capsules (60 mg total) by mouth daily.   famotidine (PEPCID) 40 MG tablet Take 40 mg by mouth daily.    fluticasone-salmeterol (ADVAIR) 250-50 MCG/ACT AEPB Inhale 1 puff  into the lungs every 12 (twelve) hours.   furosemide (LASIX) 20 MG tablet Take 1 tablet (20 mg total) by mouth daily as needed (for SOB or weight gain of 3 lbs overnight).   glipiZIDE (GLUCOTROL) 5 MG tablet Take 1 tablet (5 mg total) by mouth 2 (two) times daily before a meal.   glucose blood (GE100 BLOOD GLUCOSE TEST) test strip Use as instructed   JARDIANCE 25 MG TABS tablet Take 25 mg by mouth daily.   Lancets (ONETOUCH ULTRASOFT) lancets Check sugar twice daily   metFORMIN (GLUCOPHAGE) 1000 MG tablet Take 1 tablet (1,000 mg total) by mouth 2 (two) times daily with a meal.   metoprolol tartrate (LOPRESSOR) 100 MG tablet Take 1 tablet (100 mg total) by mouth 2 (two) times daily.   traMADol (ULTRAM) 50 MG tablet Take 1 tablet (50 mg total) by mouth every 6 (six) hours as needed.   traZODone (DESYREL) 50 MG tablet Take 50-100 mg by mouth at bedtime.     Allergies:   Patient has no known allergies.   Social History   Socioeconomic History    Marital status: Married    Spouse name: Not on file   Number of children: Not on file   Years of education: Not on file   Highest education level: Not on file  Occupational History   Occupation: Dance movement psychotherapist    Employer: [employer]  Tobacco Use   Smoking status: Never   Smokeless tobacco: Never  Vaping Use   Vaping Use: Never used  Substance and Sexual Activity   Alcohol use: Yes    Comment: few times per year/ socially   Drug use: No   Sexual activity: Yes  Other Topics Concern   Not on file  Social History Narrative   Goes to gym.   Social Determinants of Health   Financial Resource Strain: Not on file  Food Insecurity: Not on file  Transportation Needs: Not on file  Physical Activity: Not on file  Stress: Not on file  Social Connections: Not on file     Family History: The patient's family history includes Asthma in his mother; Diabetes in his mother; Heart attack in his father; Heart disease in his father; Heart disease (age of onset: 22) in his brother; Sjogren's syndrome in his sister.  ROS:   Review of Systems  Constitutional: Negative.   HENT: Negative.    Eyes: Negative.   Respiratory: Negative.    Cardiovascular:  Positive for leg swelling. Negative for chest pain, palpitations, orthopnea, claudication and PND.       Occasional dependent leg swelling when sitting at desk at home doing computer work.  This is at his baseline.  Gastrointestinal:  Positive for diarrhea. Negative for abdominal pain, blood in stool, constipation, heartburn, melena, nausea and vomiting.  Genitourinary: Negative.   Musculoskeletal: Negative.   Skin: Negative.   Neurological: Negative.   Endo/Heme/Allergies: Negative.   Psychiatric/Behavioral: Negative.      Please see the history of present illness.    All other systems reviewed and are negative.  EKGs/Labs/Other Studies Reviewed:    The following studies were reviewed today:   EKG:  EKG is ordered today.  The ekg  ordered today demonstrates NSR, 88 bpm, otherwise no acute changes.   Recent Labs: 09/17/2021: B Natriuretic Peptide 26.4 09/18/2021: Magnesium 1.9 09/29/2021: BUN 24; Creatinine, Ser 1.21; Hemoglobin 14.0; Platelets 247; Potassium 4.3; Sodium 137  Recent Lipid Panel    Component Value Date/Time   CHOL 145  09/07/2021 0924   CHOL 176 12/08/2017 1150   TRIG 113 09/07/2021 0924   HDL 43 09/07/2021 0924   HDL 44 12/08/2017 1150   CHOLHDL 3.4 09/07/2021 0924   VLDL 23 09/07/2021 0924   LDLCALC 79 09/07/2021 0924   LDLCALC 103 (H) 12/08/2017 1150    Physical Exam:    VS:  BP 118/80 (BP Location: Left Arm, Patient Position: Sitting, Cuff Size: Large)   Pulse 89   Ht 5' 8"  (1.727 m)   Wt (!) 337 lb 6.4 oz (153 kg)   SpO2 98%   BMI 51.30 kg/m     Wt Readings from Last 3 Encounters:  02/19/22 (!) 337 lb 6.4 oz (153 kg)  01/02/22 (!) 340 lb (154.2 kg)  10/22/21 (!) 330 lb 6.4 oz (149.9 kg)     GEN: Morbidly obese, 56 y.o. Caucasian male in NAD  HEENT: Normal NECK: No JVD; No carotid bruits CARDIAC: S1/S2, RRR, no murmurs, rubs, gallops; 2+ radial pulses, strong and equal bilaterally; 1+ peripheral PT, equal bilaterally RESPIRATORY:  Clear and diminished to auscultation without rales, wheezing or rhonchi  ABDOMEN: Soft, non-tender, non-distended MUSCULOSKELETAL: 1+ dependent edema along BLE, otherwise normal; No deformity  SKIN: Warm and dry NEUROLOGIC:  Alert and oriented x 3 PSYCHIATRIC:  Normal affect   ASSESSMENT:    1. Coronary artery disease of native artery of native heart with stable angina pectoris ([place])   2. Hyperlipidemia LDL goal <70   3. Cardiomyopathy, unspecified type ([place])   4. Hypertension, unspecified type   5. Morbid obesity ([place])   6. OSA (obstructive sleep apnea)   7. Diarrhea, unspecified type    PLAN:    In order of problems listed above:  CAD, s/p PCI DES to ostial LAD Left heart cath in 2023 revealed mild to moderate, nonobstructive CAD was  similar to previous catheterization.  Widely patent ostial/proximal LAD stent.  Normal LVEDP.  Significant LV outflow gradient of up to 80 mmHg at rest and 910 mmHg with provocation.  Findings are suspicious for hypertrophic cardiomyopathy. Denies any anginal symptoms. EKG does not show any ischemic changes. Continue 12 months of DAPT ASA and Brilinta and continue Lipitor, and metoprolol. Resume cardiac rehab after f/u with PCP about diarrhea and symptoms have resolved.   2. HLD LDL in August 2023 was 48, total cholesterol 119.  Continue Lipitor. Heart healthy diet and regular cardiovascular exercise encouraged.   3. Cardiomyopathy (HFimpEF) Cardiac catheter showed significant LV outflow gradient of up to 80 mmHg at rest and 110 mmHg provocation.  Findings were suspicious for hypertrophic cardiomyopathy.  Cardiac MRI revealed no LVOT obstruction or gradient noted.  LVEF 60%. Euvolemic and well compensated on exam.  Low sodium diet, fluid restriction <2L, and daily weights encouraged. Educated to contact our office for weight gain of 2 lbs overnight or 5 lbs in one week. Continue current medication management. Will obtain BMET today.   4. HTN BP today, 118/80.  BP well controlled at home. Continue metoprolol. Heart healthy diet and regular cardiovascular exercise encouraged.   5. Morbid obesity BMI today 51.30.  His his weight is down 3 pounds from 12/2021. Weight loss via diet and exercise encouraged. Discussed the impact being overweight would have on cardiovascular risk.   6. OSA on CPAP Encouraged continued compliance. Stated will resume once mask is fixed.   7. Diarrhea Ongoing x 2 weeks. Etiology unknown. Does not believe it is due to something he ate. Continue to follow with PCP and  GI. Will obtain BMET and Magnesium level today.   8. Disposition: Follow-up with Dr. Fletcher Anon in 3 months or sooner if anything changes.    Medication Adjustments/Labs and Tests Ordered: Current medicines are  reviewed at length with the patient today.  Concerns regarding medicines are outlined above.  Orders Placed This Encounter  Procedures   Basic metabolic panel   Magnesium   EKG 12-Lead   No orders of the defined types were placed in this encounter.   Patient Instructions  Medication Instructions:  Your physician recommends that you continue on your current medications as directed. Please refer to the Current Medication list given to you today.  *If you need a refill on your cardiac medications before your next appointment, please call your pharmacy*   Lab Work: BMET and Mag today If you have labs (blood work) drawn today and your tests are completely normal, you will receive your results only by: [place] (if you have MyChart) OR A paper copy in the mail If you have any lab test that is abnormal or we need to change your treatment, we will call you to review the results.   Follow-Up: At [facility], you and your health needs are our priority.  As part of our continuing mission to provide you with exceptional heart care, we have created designated Provider Care Teams.  These Care Teams include your primary Cardiologist (physician) and Advanced Practice Providers (APPs -  Physician Assistants and Nurse Practitioners) who all work together to provide you with the care you need, when you need it.  Your next appointment:   3 month(s)  The format for your next appointment:   In Person  Provider:   [name], MD    Important Information About Sugar         Signed, Finis Bud, NP  02/19/2022 4:35 PM    [place]

## 2022-02-19 ENCOUNTER — Ambulatory Visit: Payer: BLUE CROSS/BLUE SHIELD | Attending: Physician Assistant | Admitting: Nurse Practitioner

## 2022-02-19 ENCOUNTER — Encounter: Payer: Self-pay | Admitting: Nurse Practitioner

## 2022-02-19 ENCOUNTER — Other Ambulatory Visit
Admission: RE | Admit: 2022-02-19 | Discharge: 2022-02-19 | Disposition: A | Discharge status: Home / Self Care | Payer: BLUE CROSS/BLUE SHIELD | Source: Ambulatory Visit | Attending: Nurse Practitioner | Admitting: Nurse Practitioner

## 2022-02-19 VITALS — BP 118/80 | HR 89 | Ht 68.0 in | Wt 337.4 lb

## 2022-02-19 DIAGNOSIS — I25118 Atherosclerotic heart disease of native coronary artery with other forms of angina pectoris: Secondary | ICD-10-CM | POA: Diagnosis not present

## 2022-02-19 DIAGNOSIS — I10 Essential (primary) hypertension: Secondary | ICD-10-CM

## 2022-02-19 DIAGNOSIS — E785 Hyperlipidemia, unspecified: Secondary | ICD-10-CM

## 2022-02-19 DIAGNOSIS — I429 Cardiomyopathy, unspecified: Secondary | ICD-10-CM | POA: Diagnosis not present

## 2022-02-19 DIAGNOSIS — G4733 Obstructive sleep apnea (adult) (pediatric): Secondary | ICD-10-CM

## 2022-02-19 DIAGNOSIS — R197 Diarrhea, unspecified: Secondary | ICD-10-CM

## 2022-02-19 LAB — BASIC METABOLIC PANEL
Anion gap: 10 (ref 5–15)
BUN: 17 mg/dL (ref 6–20)
CO2: 22 mmol/L (ref 22–32)
Calcium: 8.8 mg/dL — ABNORMAL LOW (ref 8.9–10.3)
Chloride: 104 mmol/L (ref 98–111)
Creatinine, Ser: 1.02 mg/dL (ref 0.61–1.24)
GFR, Estimated: >60 mL/min (ref >60)
Glucose, Bld: 238 mg/dL — ABNORMAL HIGH (ref 70–99)
Potassium: 4 mmol/L (ref 3.5–5.1)
Sodium: 136 mmol/L (ref 135–145)

## 2022-02-19 LAB — MAGNESIUM: Magnesium: 1.8 mg/dL (ref 1.7–2.4)

## 2022-02-19 NOTE — Patient Instructions (Signed)
Medication Instructions:  Your physician recommends that you continue on your current medications as directed. Please refer to the Current Medication list given to you today.  *If you need a refill on your cardiac medications before your next appointment, please call your pharmacy*   Lab Work: BMET and Mag today If you have labs (blood work) drawn today and your tests are completely normal, you will receive your results only by: [place] (if you have MyChart) OR A paper copy in the mail If you have any lab test that is abnormal or we need to change your treatment, we will call you to review the results.   Follow-Up: At [facility], you and your health needs are our priority.  As part of our continuing mission to provide you with exceptional heart care, we have created designated Provider Care Teams.  These Care Teams include your primary Cardiologist (physician) and Advanced Practice Providers (APPs -  Physician Assistants and Nurse Practitioners) who all work together to provide you with the care you need, when you need it.  Your next appointment:   3 month(s)  The format for your next appointment:   In Person  Provider:   [name], MD    Important Information About Sugar

## 2022-02-23 ENCOUNTER — Other Ambulatory Visit: Payer: Self-pay | Admitting: Internal Medicine

## 2022-02-23 NOTE — Telephone Encounter (Signed)
Rx refill sent to pharmacy. 

## 2022-02-26 ENCOUNTER — Encounter: Payer: Self-pay | Admitting: *Deleted

## 2022-02-26 DIAGNOSIS — Z955 Presence of coronary angioplasty implant and graft: Secondary | ICD-10-CM

## 2022-02-26 NOTE — Progress Notes (Signed)
Cardiac Individual Treatment Plan  Patient Details  Name: KRISS ISHLER MRN: [id number] Date of Birth: Oct 13, 1965 Referring Provider:   Flowsheet Row Cardiac Rehab from 10/21/2021 in [facility] Cardiac and Pulmonary Rehab  Referring Provider [name] MD       Initial Encounter Date:  Flowsheet Row Cardiac Rehab from 10/21/2021 in [facility] Cardiac and Pulmonary Rehab  Date 10/21/21       Visit Diagnosis: Status post coronary artery stent placement  Patient's Home Medications on Admission:  Current Outpatient Medications:    albuterol (VENTOLIN HFA) 108 (90 Base) MCG/ACT inhaler, Inhale 1-2 puffs into the lungs every 6 (six) hours as needed for wheezing or shortness of breath., Disp: 18 g, Rfl: 1   aspirin EC 81 MG tablet, Take 1 tablet (81 mg total) by mouth daily. Swallow whole., Disp: 30 tablet, Rfl: 6   atorvastatin (LIPITOR) 40 MG tablet, Take 1 tablet (40 mg total) by mouth daily at 6 PM., Disp: 30 tablet, Rfl: 2   cyclobenzaprine (FLEXERIL) 10 MG tablet, Take 1 tablet (10 mg total) by mouth 3 (three) times daily as needed.For spasms, Disp: 90 tablet, Rfl: 5   DULoxetine (CYMBALTA) 30 MG capsule, Take 2 capsules (60 mg total) by mouth daily., Disp: , Rfl:    famotidine (PEPCID) 40 MG tablet, Take 40 mg by mouth daily. , Disp: , Rfl:    fluticasone-salmeterol (ADVAIR) 250-50 MCG/ACT AEPB, Inhale 1 puff into the lungs every 12 (twelve) hours., Disp: 60 each, Rfl: 6   furosemide (LASIX) 20 MG tablet, Take 1 tablet (20 mg total) by mouth daily as needed (for SOB or weight gain of 3 lbs overnight)., Disp: 90 tablet, Rfl: 3   glipiZIDE (GLUCOTROL) 5 MG tablet, Take 1 tablet (5 mg total) by mouth 2 (two) times daily before a meal., Disp: 180 tablet, Rfl: 1   glucose blood (GE100 BLOOD GLUCOSE TEST) test strip, Use as instructed, Disp: 100 each, Rfl: 12   JARDIANCE 25 MG TABS tablet, Take 25 mg by mouth daily., Disp: , Rfl:    Lancets (ONETOUCH ULTRASOFT) lancets, Check sugar twice daily,  Disp: 100 each, Rfl: 12   metFORMIN (GLUCOPHAGE) 1000 MG tablet, Take 1 tablet (1,000 mg total) by mouth 2 (two) times daily with a meal., Disp: 180 tablet, Rfl: 1   metoprolol tartrate (LOPRESSOR) 100 MG tablet, Take 1 tablet (100 mg total) by mouth 2 (two) times daily., Disp: 60 tablet, Rfl: 2   ticagrelor (BRILINTA) 90 MG TABS tablet, Take 1 tablet (90 mg total) by mouth 2 (two) times daily., Disp: 60 tablet, Rfl: 2   traMADol (ULTRAM) 50 MG tablet, Take 1 tablet (50 mg total) by mouth every 6 (six) hours as needed., Disp: 20 tablet, Rfl: 0   traZODone (DESYREL) 50 MG tablet, Take 50-100 mg by mouth at bedtime., Disp: , Rfl:   Past Medical History: Past Medical History:  Diagnosis Date   Asthma    CAD (coronary artery disease)    a. 08/2017 Cath: Diff nonobs dzs, EF 45-50%, diff HK; b. 08/2021 MV: Ant/apical ishemia; c. 08/2021 PCI: LAD 95ost (4.0x15 Onyx Frontier DES), EF 55-65%; c. 09/2021 Cath: LM nl, LAD patent stent, 20m 30d, RI min irregs, LCX min irregs, RCA 50p. LVOT grad 50-8101mg (rest), 70-11038m (provoked).   DDD (degenerative disc disease), lumbar    Depression    [place]  Diarrhea    Essential hypertension, benign    HFimpEF (heart failure with improved ejection fraction) (  [place])    a. 08/2017 LV gram: EF 45-50%, glob HK; b. 08/2021 Echo: EF 60-65%, no rwma, GrI DD, nl RV fxn; c. 09/2021 cMRI: EF 60%, no LVOT obstruction/gradient. Mild conc LVH, mild AS. No myocardial scar/fibrosis. No evidence of HCM.   Hyperlipidemia    Leg swelling    Left   Low back pain    Mixed ICM & NICM    a. 08/2017 LV gram: EF 45-50%, glob HK; b. 08/2021 Echo: EF 60-65%.   Morbid obesity ([place])    OSA on CPAP    non compliant   Pulmonary hypertension (HCC)     Tobacco Use: Social History   Tobacco Use  Smoking Status Never  Smokeless Tobacco Never    Labs: Review Flowsheet  More data exists      Latest Ref Rng & Units 02/15/2017 05/24/2017 08/23/2017  12/08/2017 09/07/2021  Labs for ITP Cardiac and Pulmonary Rehab  Cholestrol 0 - 200 mg/dL - - - 176  145   LDL (calc) 0 - 99 mg/dL - - - 103  79   HDL-C >40 mg/dL - - - 44  43   Trlycerides <150 mg/dL - - - 143  113   Hemoglobin A1c 4.8 - 5.6 % 8.1  8.1  C 6.6  6.9  7.5     Details      C Corrected result          Exercise Target Goals: Exercise Program Goal: Individual exercise prescription set using results from initial 6 min walk test and THRR while considering  patient's activity barriers and safety.   Exercise Prescription Goal: Initial exercise prescription builds to 30-45 minutes a day of aerobic activity, 2-3 days per week.  Home exercise guidelines will be given to patient during program as part of exercise prescription that the participant will acknowledge.   Education: Aerobic Exercise: - Group verbal and visual presentation on the components of exercise prescription. Introduces F.I.T.T principle from ACSM for exercise prescriptions.  Reviews F.I.T.T. principles of aerobic exercise including progression. Written material given at graduation.   Education: Resistance Exercise: - Group verbal and visual presentation on the components of exercise prescription. Introduces F.I.T.T principle from ACSM for exercise prescriptions  Reviews F.I.T.T. principles of resistance exercise including progression. Written material given at graduation.    Education: Exercise & Equipment Safety: - Individual verbal instruction and demonstration of equipment use and safety with use of the equipment. Flowsheet Row Cardiac Rehab from 12/11/2021 in [facility] Cardiac and Pulmonary Rehab  Date 10/21/21  Educator [facility]  Instruction Review Code 1- Verbalizes Understanding       Education: Exercise Physiology & General Exercise Guidelines: - Group verbal and written instruction with models to review the exercise physiology of the cardiovascular system and associated critical values. Provides general  exercise guidelines with specific guidelines to those with heart or lung disease.  Flowsheet Row Cardiac Rehab from 12/11/2021 in [facility] Cardiac and Pulmonary Rehab  Date 11/13/21  Educator NT  Instruction Review Code 1- [employer] Understanding       Education: Flexibility, Balance, Mind/Body Relaxation: - Group verbal and visual presentation with interactive activity on the components of exercise prescription. Introduces F.I.T.T principle from ACSM for exercise prescriptions. Reviews F.I.T.T. principles of flexibility and balance exercise training including progression. Also discusses the mind body connection.  Reviews various relaxation techniques to help reduce and manage stress (i.e. Deep breathing, progressive muscle relaxation, and visualization). Balance handout provided to take home. Written material given at graduation.  Activity Barriers & Risk Stratification:  Activity Barriers & Cardiac Risk Stratification - 10/21/21 1028       Activity Barriers & Cardiac Risk Stratification   Activity Barriers Deconditioning;Muscular Weakness;Shortness of Breath;Balance Concerns;Neck/Spine Problems;Joint Problems   DDD in neck and spine, L shoulder limited   Cardiac Risk Stratification Moderate             6 Minute Walk:  6 Minute Walk     Row Name 10/21/21 1027         6 Minute Walk   Phase Initial     Distance 1060 feet     Walk Time 6 minutes     # of Rest Breaks 0     MPH 2     METS 2.16     RPE 14     Perceived Dyspnea  3     VO2 Peak 7.56     Symptoms Yes (comment)     Comments chest tightness 6/10, SOB     Resting HR 70 bpm     Resting BP 124/64     Resting Oxygen Saturation  98 %     Exercise Oxygen Saturation  during 6 min walk 100 %     Max Ex. HR 109 bpm     Max Ex. BP 128/64     2 Minute Post BP 114/66              Oxygen Initial Assessment:   Oxygen Re-Evaluation:   Oxygen Discharge (Final Oxygen Re-Evaluation):   Initial Exercise  Prescription:  Initial Exercise Prescription - 10/21/21 1000       Date of Initial Exercise RX and Referring Provider   Date 10/21/21    Referring Provider [name] MD      Oxygen   Maintain Oxygen Saturation 88% or higher      Treadmill   MPH 1.8    Grade 0.5    Minutes 15    METs 2.52      REL-XR   Level 1    Speed 50    Minutes 15    METs 2.5      T5 Nustep   Level 2    SPM 80    Minutes 15    METs 2.5      Biostep-RELP   Level 2    SPM 50    Minutes 15    METs 2      Track   Laps 28    Minutes 15    METs 2.52      Prescription Details   Frequency (times per week) 2    Duration Progress to 30 minutes of continuous aerobic without signs/symptoms of physical distress      Intensity   THRR 40-80% of Max Heartrate 108-145    Ratings of Perceived Exertion 11-13    Perceived Dyspnea 0-4      Progression   Progression Continue to progress workloads to maintain intensity without signs/symptoms of physical distress.      Resistance Training   Training Prescription Yes    Weight 4 lb    Reps 10-15             Perform Capillary Blood Glucose checks as needed.  Exercise Prescription Changes:   Exercise Prescription Changes     Row Name 10/21/21 1000 11/03/21 1600 12/16/21 1400 01/13/22 1500       Response to Exercise   Blood Pressure (Admit) 124/64 110/66 124/72 124/76  Blood Pressure (Exercise) 128/64 138/68 144/64 148/72    Blood Pressure (Exit) 114/66 108/68 126/70 124/62    Heart Rate (Admit) 70 bpm 72 bpm 85 bpm 87 bpm    Heart Rate (Exercise) 109 bpm 115 bpm 121 bpm 111 bpm    Heart Rate (Exit) 80 bpm 87 bpm 86 bpm 85 bpm    Oxygen Saturation (Admit) 98 % -- -- --    Oxygen Saturation (Exercise) 100 % -- -- --    Rating of Perceived Exertion (Exercise) _0 Perceived Dyspnea (Exercise) 3 -- -- --    Symptoms SOB, chest tightness 6/10 none none none    Comments walk test results 3rd full day of exercise -- --     Duration -- Progress to 30 minutes of  aerobic without signs/symptoms of physical distress Continue with 30 min of aerobic exercise without signs/symptoms of physical distress. Continue with 30 min of aerobic exercise without signs/symptoms of physical distress.    Intensity -- THRR unchanged THRR unchanged THRR unchanged      Progression   Progression -- Continue to progress workloads to maintain intensity without signs/symptoms of physical distress. Continue to progress workloads to maintain intensity without signs/symptoms of physical distress. Continue to progress workloads to maintain intensity without signs/symptoms of physical distress.    Average METs -- 2.19 2.61 2.61      Resistance Training   Training Prescription -- Yes Yes Yes    Weight -- 4 lb 5 lb 5 lb    Reps -- 10-15 10-15 10-15      Interval Training   Interval Training -- No No No      Treadmill   MPH -- 1.8 -- --    Grade -- 0.5 -- --    Minutes -- 15 -- --    METs -- 2.5 -- --      Recumbant Bike   Level -- -- 4 4    Watts -- -- 63 --    Minutes -- -- 15 15    METs -- -- 2.52 2.52      NuStep   Level -- -- 4 4    Minutes -- -- 15 15    METs -- -- 3.3 2.7      REL-XR   Level -- 1 -- --    Minutes -- 15 -- --    METs -- 1.3 -- --      T5 Nustep   Level -- 2 -- --    Minutes -- 15 -- --    METs -- 2.4 -- --      Biostep-RELP   Level -- 2 4 --    Minutes -- 15 15 --    METs -- 2 2 --      Track   Laps -- -- 30 --    Minutes -- -- 15 --    METs -- -- 2.63 --      Oxygen   Maintain Oxygen Saturation -- 88% or higher 88% or higher 88% or higher             Exercise Comments:   Exercise Comments     Row Name 10/23/21 0800           Exercise Comments First full day of exercise!  Patient was oriented to gym and equipment including functions, settings, policies, and procedures.  Patient's individual exercise prescription and treatment plan were reviewed.  All starting workloads were  established based on the results of the 6 minute walk test done at initial orientation visit.  The plan for exercise progression was also introduced and progression will be customized based on patient's performance and goals.                Exercise Goals and Review:   Exercise Goals     Row Name 10/21/21 1031             Exercise Goals   Increase Physical Activity Yes       Intervention Provide advice, education, support and counseling about physical activity/exercise needs.;Develop an individualized exercise prescription for aerobic and resistive training based on initial evaluation findings, risk stratification, comorbidities and participant's personal goals.       Expected Outcomes Long Term: Add in home exercise to make exercise part of routine and to increase amount of physical activity.;Short Term: Attend rehab on a regular basis to increase amount of physical activity.;Long Term: Exercising regularly at least 3-5 days a week.       Increase Strength and Stamina Yes       Intervention Provide advice, education, support and counseling about physical activity/exercise needs.;Develop an individualized exercise prescription for aerobic and resistive training based on initial evaluation findings, risk stratification, comorbidities and participant's personal goals.       Expected Outcomes Short Term: Increase workloads from initial exercise prescription for resistance, speed, and METs.;Short Term: Perform resistance training exercises routinely during rehab and add in resistance training at home;Long Term: Improve cardiorespiratory fitness, muscular endurance and strength as measured by increased METs and functional capacity (6MWT)       Able to understand and use rate of perceived exertion (RPE) scale Yes       Intervention Provide education and explanation on how to use RPE scale       Expected Outcomes Short Term: Able to use RPE daily in rehab to express subjective intensity  level;Long Term:  Able to use RPE to guide intensity level when exercising independently       Able to understand and use Dyspnea scale Yes       Intervention Provide education and explanation on how to use Dyspnea scale       Expected Outcomes Short Term: Able to use Dyspnea scale daily in rehab to express subjective sense of shortness of breath during exertion;Long Term: Able to use Dyspnea scale to guide intensity level when exercising independently       Knowledge and understanding of Target Heart Rate Range (THRR) Yes       Intervention Provide education and explanation of THRR including how the numbers were predicted and where they are located for reference       Expected Outcomes Short Term: Able to state/look up THRR;Short Term: Able to use daily as guideline for intensity in rehab;Long Term: Able to use THRR to govern intensity when exercising independently       Able to check pulse independently Yes       Intervention Provide education and demonstration on how to check pulse in carotid and radial arteries.;Review the importance of being able to check your own pulse for safety during independent exercise       Expected Outcomes Short Term: Able to explain why pulse checking is important during independent exercise;Long Term: Able to check pulse independently and accurately       Understanding of Exercise Prescription Yes       Intervention Provide education, explanation, and written materials on  patient's individual exercise prescription       Expected Outcomes Short Term: Able to explain program exercise prescription;Long Term: Able to explain home exercise prescription to exercise independently                Exercise Goals Re-Evaluation :  Exercise Goals Re-Evaluation     Row Name 10/23/21 0800 11/03/21 1619 11/13/21 0732 12/08/21 0725 12/16/21 1422     Exercise Goal Re-Evaluation   Exercise Goals Review Able to understand and use rate of perceived exertion (RPE) scale;Knowledge  and understanding of Target Heart Rate Range (THRR);Understanding of Exercise Prescription;Increase Physical Activity;Increase Strength and Stamina;Able to understand and use Dyspnea scale;Able to check pulse independently Increase Physical Activity;Increase Strength and Stamina;Understanding of Exercise Prescription Increase Physical Activity;Increase Strength and Stamina;Understanding of Exercise Prescription;Able to understand and use rate of perceived exertion (RPE) scale;Able to understand and use Dyspnea scale;Knowledge and understanding of Target Heart Rate Range (THRR);Able to check pulse independently Increase Physical Activity;Increase Strength and Stamina;Understanding of Exercise Prescription Increase Physical Activity;Increase Strength and Stamina;Understanding of Exercise Prescription   Comments Reviewed RPE and dyspnea scales, THR and program prescription with pt today.  Pt voiced understanding and was given a copy of goals to take home. Waylen is doing well for the first couple of sessions that he has been here. He was able to follow his initial exercise prescription. He had a hypertensive BP response on his first day but was re-checked and back to normal range. We will continue to monitor as he progresses in the program. Reviewed home exercise with pt today.  Pt plans to walk and use staff videos at home for exercise.  Reviewed THR, pulse, RPE, sign and symptoms, pulse oximetery and when to call 911 or MD.  Also discussed weather considerations and indoor options.  Pt voiced understanding.  Dondrell mentioned today that he is having chest tightness today like pressure on his chest.  It started early this morning.  He was encourged to call his doctor. Patient reports that he has incorporated some exercise at home on off days of rehab. He comes to rehab 2 times a week and has added 2-3 days of walking at home. He reported that he does want to start progressing at home and do more walking as he continues  to get stronger. He has communicated with his doctor about his chest pressure and he is activly monitoring his symptoms. Courvoisier has been doing well in rehab. He recently improved his overall average METs to 2.61 METs. He also increased from 28 to 30 laps on the track. Marlen improved to level 4 on the Biostep as well. He also began using 5 lb hand weights for resistance training as well. We will continue to monitor his progress in the program.   Expected Outcomes Short: Use RPE daily to regulate intensity. Long: Follow program prescription in THR. Short: Follow current exercise prescription Long: Increase overall strength and MET level Short: Call doctor about chest pressure Long: Conitnue to exercise independently Short: continue to monitor chest pressure and be in communication with his doctor about any changes or concerns. Continue to progresss with home exercise as tolerated without symptoms. Long: continue to exercise independently. Short: Continue to push for more laps on the track. Long: Continue to increase strength and stamina.    [place] Name 12/29/21 0725 01/13/22 1509 01/27/22 1444 02/25/22 1333       Exercise Goal Re-Evaluation   Exercise Goals Review Increase Physical Activity;Increase Strength and Stamina;Understanding of Exercise  Prescription Increase Physical Activity;Increase Strength and Stamina;Understanding of Exercise Prescription -- Increase Physical Activity;Increase Strength and Stamina;Understanding of Exercise Prescription    Comments [place] was out last week with a stomach bug.  He was not able to exercise while he was out.  He is walking some outside on his off days.  He will go up and down street.  He was starting to feel like his strength and stamina were recovering and now feels like being sick has definitely set him back some.  He wants to rebuild his stamina again. Jas has only attended rehab once since the last evaluation. His overall average MET level has been consistent at  2.61 METs. He has also done well with level 4 on the recumbent bike and T4. He has tolerated using 5 lb hand weights for resistance training. We will continue to monitor his progress in the program. Out since last review.  Last attended on 12/29/21. Patient has not attended rehab since 8/14 and has not returned since. He has been experiencing diarrhea and is supposed to follow up with his PCP about it. He has not returned our calls back and his 18 weeks will be up soon. We will have to discuss possible discharge pending how patient feels as well as testing results.    Expected Outcomes Short: Get back to regular exercise Long: Continue to improve stamina Short: Attend rehab regularly. Long: Continue to improve strength and stamina -- Short: Get back to rehab and maintain good attendance Long: Graduate from [facility]             Discharge Exercise Prescription (Final Exercise Prescription Changes):  Exercise Prescription Changes - 01/13/22 1500       Response to Exercise   Blood Pressure (Admit) 124/76    Blood Pressure (Exercise) 148/72    Blood Pressure (Exit) 124/62    Heart Rate (Admit) 87 bpm    Heart Rate (Exercise) 111 bpm    Heart Rate (Exit) 85 bpm    Rating of Perceived Exertion (Exercise) 16    Symptoms none    Duration Continue with 30 min of aerobic exercise without signs/symptoms of physical distress.    Intensity THRR unchanged      Progression   Progression Continue to progress workloads to maintain intensity without signs/symptoms of physical distress.    Average METs 2.61      Resistance Training   Training Prescription Yes    Weight 5 lb    Reps 10-15      Interval Training   Interval Training No      Recumbant Bike   Level 4    Minutes 15    METs 2.52      NuStep   Level 4    Minutes 15    METs 2.7      Oxygen   Maintain Oxygen Saturation 88% or higher             Nutrition:  Target Goals: Understanding of nutrition guidelines, daily intake  of sodium <1574m, cholesterol <208m calories 30% from fat and 7% or less from saturated fats, daily to have 5 or more servings of fruits and vegetables.  Education: All About Nutrition: -Group instruction provided by verbal, written material, interactive activities, discussions, models, and posters to present general guidelines for heart healthy nutrition including fat, fiber, MyPlate, the role of sodium in heart healthy nutrition, utilization of the nutrition label, and utilization of this knowledge for meal planning. Follow up email sent as well.  Written material given at graduation. Flowsheet Row Cardiac Rehab from 12/11/2021 in [facility] Cardiac and Pulmonary Rehab  Education need identified 10/21/21  Date 12/11/21  Educator [place]  Instruction Review Code 1- Verbalizes Understanding       Biometrics:  Pre Biometrics - 10/21/21 1031       Pre Biometrics   Height 5' 9.5" (1.765 m)    Weight 329 lb 8 oz (149.5 kg)    BMI (Calculated) 47.98    Single Leg Stand 3.5 seconds              Nutrition Therapy Plan and Nutrition Goals:  Nutrition Therapy & Goals - 11/11/21 1329       Nutrition Therapy   Diet Heart Healthy, T2DM MNT    Drug/Food Interactions Statins/Certain Fruits    Protein (specify units) 80g    Fiber 30 grams    Whole Grain Foods 3 servings    Saturated Fats 16 max. grams    Fruits and Vegetables 8 servings/day    Sodium 2 grams      Personal Nutrition Goals   Nutrition Goal ST: swap out some grains with whole grains - try whole grain bread, brainstorm easy to prep meals  LT: Limit eating out <3x/week, follow MyPlate guidelines, limit Na <2g    Comments 56 y.o. M admitted to cardiac rehab s/p coronary artery stent placement. PMHx includes HTN, HLD, chronic systolic congestive heart failure, mixed ischemic and nonischemic cardiomyopathy, CAD, T2DM, GERD. Relevant medications include lipitor, cymbalta, pepcid, lasix, glipizide, jardiance, metformin, tramadol,  trazodone. PYP Score: 53. Vegetables & Fruits 6/12. Breads, Grains & Cereals 6/12. Red & Processed Meat 8/12. Poultry 0/2. Fish & Shellfish 0/4. B: Bagel (cinnamon raisin - thomas's) with cream cheese or toast (italian white bread with peanut butter) and sometimes bacon, egg and cheese biscuit - with unsweetened tea or OJ S: granola bar with peanuts in it or some fruit L: sandwich ([place] with mayo and american cheese on white bread) or chicken sandwich (roll or bun) or sometimes pizza on the weekend or sometimes house salad ([place] or caesar). S: usually no snack, but if he does he will have sun chips (he likes the crunch factor). D: house salad, door dash (salads from [employer] with chicken, hamburger, hotdog, steak, pizza, spaghetti, [place] food, [place] (brown rice with order)).  S: sun chips sometimes. Drinks: orange soda with no added sugar or gaterade sometimes, not usually water. He will occasionally get heartburn. Discussed general heart healthy eating and diabetes friendly eating.      Intervention Plan   Intervention Prescribe, educate and counsel regarding individualized specific dietary modifications aiming towards targeted core components such as weight, hypertension, lipid management, diabetes, heart failure and other comorbidities.    Expected Outcomes Long Term Goal: Adherence to prescribed nutrition plan.;Short Term Goal: A plan has been developed with personal nutrition goals set during dietitian appointment.;Short Term Goal: Understand basic principles of dietary content, such as calories, fat, sodium, cholesterol and nutrients.             Nutrition Assessments:  MEDIFICTS Score Key: ?70 Need to make dietary changes  40-70 Heart Healthy Diet ? 40 Therapeutic Level Cholesterol Diet  Flowsheet Row Cardiac Rehab from 10/21/2021 in [facility] Cardiac [facility] Rehab  Picture Your Plate Total Score on Admission 53      Picture Your Plate Scores: <32 Unhealthy dietary  pattern with much room for improvement. 41-50 Dietary pattern unlikely to meet recommendations for good health  and room for improvement. 51-60 More healthful dietary pattern, with some room for improvement.  >60 Healthy dietary pattern, although there may be some specific behaviors that could be improved.    Nutrition Goals Re-Evaluation:  Nutrition Goals Re-Evaluation     Row Name 12/08/21 0735 12/29/21 0731           Goals   Nutrition Goal ST: swap out some grains with whole grains - try whole grain bread, brainstorm easy to prep meals  LT: Limit eating out <3x/week, follow MyPlate guidelines, limit Na <2g Short: work on finding easy meal prep ideas. Long: help control cardiac risk factors with heart healthy eating.      Comment Patient reports that he has been trying to switch from refined grains to whole grains, and has been reducing the amount of sodium consumption. He is incorporating more fruits and vegetables into his diet and drinking more water instead of other drinks. He still needs to work on brainstorming for easy meal prep ideas. Brogen is doing well with his diet.  Last week he had stomach bug and couldn't eat a whole lot.  But, prior to that, he was starting to work on meal prep.  He continues to try to find new options and plans ahead.  He is trying to get in more whole grains still.  He has not cut back on eating out eat, but he is trying to make more healthier options now.  He is trying to read labels more and is more aware of it.      Expected Outcome Short: work on finding easy meal prep ideas. Long: help control cardiac risk factors with heart healthy eating. Short: Continue to cut back on eating out Long: Conitnue to improve diet               Nutrition Goals Discharge (Final Nutrition Goals Re-Evaluation):  Nutrition Goals Re-Evaluation - 12/29/21 0731       Goals   Nutrition Goal Short: work on finding easy meal prep ideas. Long: help control cardiac risk factors  with heart healthy eating.    Comment Cloys is doing well with his diet.  Last week he had stomach bug and couldn't eat a whole lot.  But, prior to that, he was starting to work on meal prep.  He continues to try to find new options and plans ahead.  He is trying to get in more whole grains still.  He has not cut back on eating out eat, but he is trying to make more healthier options now.  He is trying to read labels more and is more aware of it.    Expected Outcome Short: Continue to cut back on eating out Long: Conitnue to improve diet             Psychosocial: Target Goals: Acknowledge presence or absence of significant depression and/or stress, maximize coping skills, provide positive support system. Participant is able to verbalize types and ability to use techniques and skills needed for reducing stress and depression.   Education: Stress, Anxiety, and Depression - Group verbal and visual presentation to define topics covered.  Reviews how body is impacted by stress, anxiety, and depression.  Also discusses healthy ways to reduce stress and to treat/manage anxiety and depression.  Written material given at graduation. Flowsheet Row Cardiac Rehab from 12/11/2021 in [facility] Cardiac and Pulmonary Rehab  Date 11/06/21  Educator [facility]  Instruction Review Code 1- [employer]  Education: Sleep Hygiene -Provides group verbal and written instruction about how sleep can affect your health.  Define sleep hygiene, discuss sleep cycles and impact of sleep habits. Review good sleep hygiene tips.    Initial Review & Psychosocial Screening:  Initial Psych Review & Screening - 10/08/21 1343       Initial Review   Current issues with None Identified      Family Dynamics   Good Support System? Yes   Wife,friends, church friends     Barriers   Psychosocial barriers to participate in program There are no identifiable barriers or psychosocial needs.      Screening Interventions    Interventions Encouraged to exercise;To provide support and resources with identified psychosocial needs;Provide feedback about the scores to participant    Expected Outcomes Short Term goal: Utilizing psychosocial counselor, staff and physician to assist with identification of specific Stressors or current issues interfering with healing process. Setting desired goal for each stressor or current issue identified.;Long Term Goal: Stressors or current issues are controlled or eliminated.;Short Term goal: Identification and review with participant of any Quality of Life or Depression concerns found by scoring the questionnaire.;Long Term goal: The participant improves quality of Life and PHQ9 Scores as seen by post scores and/or verbalization of changes             Quality of Life Scores:   Quality of Life - 10/21/21 1032       Quality of Life   Select Quality of Life      Quality of Life Scores   Health/Function Pre 12.2 %    Socioeconomic Pre 22.93 %    Psych/Spiritual Pre 16.29 %    Family Pre 22.5 %    GLOBAL Pre 16.59 %            Scores of 19 and below usually indicate a poorer quality of life in these areas.  A difference of  2-3 points is a clinically meaningful difference.  A difference of 2-3 points in the total score of the Quality of Life Index has been associated with significant improvement in overall quality of life, self-image, physical symptoms, and general health in studies assessing change in quality of life.  PHQ-9: Review Flowsheet  More data exists      12/29/2021 12/08/2021 11/13/2021 10/21/2021 12/08/2017  Depression screen PHQ 2/9  Decreased Interest 0 0 1 2 0  Down, Depressed, Hopeless 0 1 0 0 0  PHQ - 2 Score 0 _0 0  Altered sleeping _1 -  Tired, decreased energy _2 -  Change in appetite 0 0 1 2 -  Feeling bad or failure about yourself  0 0 0 0 -  Trouble concentrating 0 1 0 0 -  Moving slowly or fidgety/restless 0 1 0 1 -  Suicidal  thoughts 0 0 0 0 -  PHQ-9 Score _3 -  Difficult doing work/chores Somewhat difficult Somewhat difficult Somewhat difficult Somewhat difficult -   Interpretation of Total Score  Total Score Depression Severity:  1-4 = Minimal depression, 5-9 = Mild depression, 10-14 = Moderate depression, 15-19 = Moderately severe depression, 20-27 = Severe depression   Psychosocial Evaluation and Intervention:  Psychosocial Evaluation - 10/08/21 1401       Psychosocial Evaluation & Interventions   Interventions Encouraged to exercise with the program and follow exercise prescription    Comments Alden has no barriers to attending the program. He lives  with his wife,. His wife, friends and church friends are his support team. Kennon is working on weight loss. He has lost over 100 lbs and is continuing the weight loss journey. He wants to learn how to manage an exercise program and to continue with his weight loss. HIs long term goal is to reduce the anmount of medicine he is taking.    Expected Outcomes STG Maaz is able to attend all scheduled sessions, he works with EP and RD to continue his weight loss journey, and he manages to progress to a routine exercise program LTG Arlyn continues to progress with exercise and weight loss after discharge. He is able to in the longterm to decrease his medication needs    Continue Psychosocial Services  Follow up required by staff             Psychosocial Re-Evaluation:  Psychosocial Re-Evaluation     [place] Name 11/13/21 0735 12/08/21 0732 12/08/21 0738 12/29/21 0727       Psychosocial Re-Evaluation   Current issues with Current Stress Concerns;Current Sleep Concerns Current Stress Concerns;Current Sleep Concerns Current Stress Concerns;Current Sleep Concerns Current Stress Concerns;Current Sleep Concerns    Comments Mykael's PHQ has improved to 6 down from 10.  He is still having difficulty with sleep and energy levels.  He is also having chest tightness  today.  He was encouraged to talk to the doctor about chest tightness.  He has not had a recent sleep study but will see a pulmonologist in August and wants to get a new CPAP mask as his does not stay on very well.  Thus his CPAP compliance is not the greatest as it usually falls off during the night.  These are his biggest sources of stressors.  He has noticed the bruising popping up from his blood thinner that sometimes catches him off guard. -- Dominique's PHQ follow up was 7. This was slightly higher then last time but still lower then when he started. He does feel like he is doing better mentally then when he started the program. He still reports some sleep trouble but nothing new and has an appointment with the company to talk about his new CPAP machine scheduled for august. He has talked to his doctor about his chest tightness and  he is monitoring it and in communication with his doctor about that. Judd is doing well in rehab.  He was out last week with a stomach bug.  He was frustrated to feel held back with it.  Despite being sick, his PHQ has improved to a 3!!  He is his still struggling with sleep.  He wakes freqently at night and sometimes struggles to get back to sleep, but sometimes not able to return to sleep.  He has his fitting for a new CPAP this Friday.  Overall, he is feeling better.    Expected Outcomes Short: Talk to doctor about chest tightness Long: Continue to work on sleep -- Short: have appointment with CPAP company to get new machine. Long: Maintain godd mental health habits. Short: Start wearing CPAP again after appt Long: Continue to exercise for mental boost.    Interventions Encouraged to attend Cardiac Rehabilitation for the exercise -- Encouraged to attend Cardiac Rehabilitation for the exercise Encouraged to attend Cardiac Rehabilitation for the exercise;Stress management education    Continue Psychosocial Services  Follow up required by staff -- Follow up required by staff Follow  up required by staff  Psychosocial Discharge (Final Psychosocial Re-Evaluation):  Psychosocial Re-Evaluation - 12/29/21 0727       Psychosocial Re-Evaluation   Current issues with Current Stress Concerns;Current Sleep Concerns    Comments Layson is doing well in rehab.  He was out last week with a stomach bug.  He was frustrated to feel held back with it.  Despite being sick, his PHQ has improved to a 3!!  He is his still struggling with sleep.  He wakes freqently at night and sometimes struggles to get back to sleep, but sometimes not able to return to sleep.  He has his fitting for a new CPAP this Friday.  Overall, he is feeling better.    Expected Outcomes Short: Start wearing CPAP again after appt Long: Continue to exercise for mental boost.    Interventions Encouraged to attend Cardiac Rehabilitation for the exercise;Stress management education    Continue Psychosocial Services  Follow up required by staff             Vocational Rehabilitation: Provide vocational rehab assistance to qualifying candidates.   Vocational Rehab Evaluation & Intervention:   Education: Education Goals: Education classes will be provided on a variety of topics geared toward better understanding of heart health and risk factor modification. Participant will state understanding/return demonstration of topics presented as noted by education test scores.  Learning Barriers/Preferences:   General Cardiac Education Topics:  AED/CPR: - Group verbal and written instruction with the use of models to demonstrate the basic use of the AED with the basic ABC's of resuscitation.   Anatomy and Cardiac Procedures: - Group verbal and visual presentation and models provide information about basic cardiac anatomy and function. Reviews the testing methods done to diagnose heart disease and the outcomes of the test results. Describes the treatment choices: Medical Management, Angioplasty, or Coronary  Bypass Surgery for treating various heart conditions including Myocardial Infarction, Angina, Valve Disease, and Cardiac Arrhythmias.  Written material given at graduation. Flowsheet Row Cardiac Rehab from 12/11/2021 in [facility] Cardiac and Pulmonary Rehab  Education need identified 10/21/21  Date 11/27/21  Educator SB  Instruction Review Code 1- Verbalizes Understanding       Medication Safety: - Group verbal and visual instruction to review commonly prescribed medications for heart and lung disease. Reviews the medication, class of the drug, and side effects. Includes the steps to properly store meds and maintain the prescription regimen.  Written material given at graduation.   Intimacy: - Group verbal instruction through game format to discuss how heart and lung disease can affect sexual intimacy. Written material given at graduation..   Know Your Numbers and Heart Failure: - Group verbal and visual instruction to discuss disease risk factors for cardiac and pulmonary disease and treatment options.  Reviews associated critical values for Overweight/Obesity, Hypertension, Cholesterol, and Diabetes.  Discusses basics of heart failure: signs/symptoms and treatments.  Introduces Heart Failure Zone chart for action plan for heart failure.  Written material given at graduation.   Infection Prevention: - Provides verbal and written material to individual with discussion of infection control including proper hand washing and proper equipment cleaning during exercise session. Flowsheet Row Cardiac Rehab from 12/11/2021 in [facility] Cardiac and Pulmonary Rehab  Date 10/21/21  Educator [facility]  Instruction Review Code 1- Verbalizes Understanding       Falls Prevention: - Provides verbal and written material to individual with discussion of falls prevention and safety. Flowsheet Row Cardiac Rehab from 12/11/2021 in [facility] Cardiac and Pulmonary Rehab  Date 10/08/21  Educator SB  Instruction Review Code 1-  Verbalizes Understanding       Other: -Provides group and verbal instruction on various topics (see comments)   Knowledge Questionnaire Score:  Knowledge Questionnaire Score - 10/21/21 1033       Knowledge Questionnaire Score   Pre Score 24/26             Core Components/Risk Factors/Patient Goals at Admission:  Personal Goals and Risk Factors at Admission - 10/21/21 1033       Core Components/Risk Factors/Patient Goals on Admission    Weight Management Yes;Obesity;Weight Loss    Intervention Weight Management: Develop a combined nutrition and exercise program designed to reach desired caloric intake, while maintaining appropriate intake of nutrient and fiber, sodium and fats, and appropriate energy expenditure required for the weight goal.;Weight Management/Obesity: Establish reasonable short term and long term weight goals.;Obesity: Provide education and appropriate resources to help participant work on and attain dietary goals.;Weight Management: Provide education and appropriate resources to help participant work on and attain dietary goals.    Admit Weight 329 lb 8 oz (149.5 kg)    Goal Weight: Short Term 324 lb (147 kg)    Goal Weight: Long Term 225 lb (102.1 kg)    Expected Outcomes Short Term: Continue to assess and modify interventions until short term weight is achieved;Long Term: Adherence to nutrition and physical activity/exercise program aimed toward attainment of established weight goal;Weight Loss: Understanding of general recommendations for a balanced deficit meal plan, which promotes 1-2 lb weight loss per week and includes a negative energy balance of [phone] kcal/d;Understanding recommendations for meals to include 15-35% energy as protein, 25-35% energy from fat, 35-60% energy from carbohydrates, less than 258m of dietary cholesterol, 20-35 gm of total fiber daily;Understanding of distribution of calorie intake throughout the day with the consumption of 4-5  meals/snacks    Diabetes Yes    Intervention Provide education about signs/symptoms and action to take for hypo/hyperglycemia.;Provide education about proper nutrition, including hydration, and aerobic/resistive exercise prescription along with prescribed medications to achieve blood glucose in normal ranges: Fasting glucose 65-99 mg/dL    Expected Outcomes Short Term: Participant verbalizes understanding of the signs/symptoms and immediate care of hyper/hypoglycemia, proper foot care and importance of medication, aerobic/resistive exercise and nutrition plan for blood glucose control.;Long Term: Attainment of HbA1C < 7%.    Heart Failure Yes    Intervention Provide a combined exercise and nutrition program that is supplemented with education, support and counseling about heart failure. Directed toward relieving symptoms such as shortness of breath, decreased exercise tolerance, and extremity edema.    Expected Outcomes Short term: Attendance in program 2-3 days a week with increased exercise capacity. Reported lower sodium intake. Reported increased fruit and vegetable intake. Reports medication compliance.;Improve functional capacity of life;Short term: Daily weights obtained and reported for increase. Utilizing diuretic protocols set by physician.;Long term: Adoption of self-care skills and reduction of barriers for early signs and symptoms recognition and intervention leading to self-care maintenance.    Hypertension Yes    Intervention Provide education on lifestyle modifcations including regular physical activity/exercise, weight management, moderate sodium restriction and increased consumption of fresh fruit, vegetables, and low fat dairy, alcohol moderation, and smoking cessation.;Monitor prescription use compliance.    Expected Outcomes Short Term: Continued assessment and intervention until BP is < 140/952mHG in hypertensive participants. < 130/8065mG in hypertensive participants with diabetes,  heart failure or chronic kidney disease.;Long Term: Maintenance of blood pressure at goal levels.  Lipids Yes    Intervention Provide education and support for participant on nutrition & aerobic/resistive exercise along with prescribed medications to achieve LDL <17m, HDL >454m    Expected Outcomes Short Term: Participant states understanding of desired cholesterol values and is compliant with medications prescribed. Participant is following exercise prescription and nutrition guidelines.;Long Term: Cholesterol controlled with medications as prescribed, with individualized exercise RX and with personalized nutrition plan. Value goals: LDL < 7045mHDL > 40 mg.             Education:Diabetes - Individual verbal and written instruction to review signs/symptoms of diabetes, desired ranges of glucose level fasting, after meals and with exercise. Acknowledge that pre and post exercise glucose checks will be done for 3 sessions at entry of program. [facility]  Date 10/21/21  [facility] Code 1- Verbalizes Understanding       Core Components/Risk Factors/Patient Goals Review:   Goals and Risk Factor Review     Row Name 11/13/21 0741 12/08/21 0727 12/29/21 0733         Core Components/Risk Factors/Patient Goals Review   Personal Goals Review Weight Management/Obesity;Hypertension;Diabetes;Lipids Weight Management/Obesity;Hypertension;Diabetes;Lipids Weight Management/Obesity;Hypertension;Diabetes;Lipids     Review ShaDon doing well in rehab.  His weight has crept up some to 333 lb today.  He is not eating more and just met with dietitian this week.  We also reviewed home exercise today, so adding that it will help with calorie burn.  His pressures are doing well and checks them at home with a wrist monitor.  He also checks his sugars reguarly at home, today they were 140 and stay around that range.  He  has been having chest pain and was encouraged to stop by office to talk to nurse or doctor. Patient reports taking all medication as prescribed. He monitors his blood glucose and blood pressure at home and reports them to be in acceptable ranges. His weight as had ups and downs but her reports that he does not have fluid retention problems. ShaSircharles doing well in rehab. His weight is staying pretty steady around 343 lb.  He is still trying to lose more.  His sugars are running in the 140s now.  This is an improvement from the 170-180s like when he started.  He will be getting his A1c checked on Friday.  He is doing well with his pressures and continues to check them as well.     Expected Outcomes Short: Continue to work on weight loss Long: Continue to monitor risk factors Short: Continue to work on weight loss Long: Continue to monitor risk factors Short: Get updated A1c  Long: Conitnue to work on [employer]              Core Components/Risk Factors/Patient Goals at Discharge (Final Review):   Goals and Risk Factor Review - 12/29/21 0733       Core Components/Risk Factors/Patient Goals Review   Personal Goals Review Weight Management/Obesity;Hypertension;Diabetes;Lipids    Review ShaMiran doing well in rehab. His weight is staying pretty steady around 343 lb.  He is still trying to lose more.  His sugars are running in the 140s now.  This is an improvement from the 170-180s like when he started.  He will be getting his A1c checked on Friday.  He is doing well with his pressures and continues to check them as well.    Expected Outcomes  Short: Get updated A1c  Long: Conitnue to work on weight loss             ITP Comments:  ITP Comments     Row Name 10/08/21 1401 10/21/21 1027 10/23/21 0800 10/29/21 1258 11/11/21 1422   ITP Comments Virtual orientation call completed today. he has an appointment on Date: 10/21/2021  for EP eval and gym Orientation.  Documentation of diagnosis can be found  in [facility] Date: 09/04/2021 . Completed 6MWT and gym orientation. Initial ITP created and sent for review to Dr. Emily Filbert, Medical Director. First full day of exercise!  Patient was oriented to gym and equipment including functions, settings, policies, and procedures.  Patient's individual exercise prescription and treatment plan were reviewed.  All starting workloads were established based on the results of the 6 minute walk test done at initial orientation visit.  The plan for exercise progression was also introduced and progression will be customized based on patient's performance and goals. 30 Day review completed. Medical Director ITP review done, changes made as directed, and signed approval by Medical Director. Completed initial RD consultation    Row Name 11/26/21 0942 12/24/21 0743 01/21/22 1329 01/27/22 1443 02/12/22 1129   ITP Comments 30 Day review completed. Medical Director ITP review done, changes made as directed, and signed approval by Medical Director. 30 Day review completed. Medical Director ITP review done, changes made as directed, and signed approval by Medical Director. 30 Day review completed. Medical Director ITP review done, changes made as directed, and signed approval by Medical Director. Pt has been out since 12/29/21.  He has been out with stomach bug and upper respiratory infection. Attempted to call patient regarding cardiac rehab to follow up as he has been out sick. Has not attended since 8/14. Left message asking for callback.    [place] Name 02/18/22 0728 02/26/22 1437         ITP Comments 30 Day review completed. Medical Director ITP review done, changes made as directed, and signed approval by Medical Director.   Remains out [place] has not been able to attend rehab since 01/01/22 for various reasons.  We will discharge him at this time.               Comments: Discharge ITP

## 2022-02-26 NOTE — Progress Notes (Signed)
Brett Marshall was discharged from rehab today.  He attended  19 sessions since 11/03/21, but has not attend since 01/01/22.    6 Minute Walk     Row Name 10/21/21 1027         6 Minute Walk   Phase Initial     Distance 1060 feet     Walk Time 6 minutes     # of Rest Breaks 0     MPH 2     METS 2.16     RPE 14     Perceived Dyspnea  3     VO2 Peak 7.56     Symptoms Yes (comment)     Comments chest tightness 6/10, SOB     Resting HR 70 bpm     Resting BP 124/64     Resting Oxygen Saturation  98 %     Exercise Oxygen Saturation  during 6 min walk 100 %     Max Ex. HR 109 bpm     Max Ex. BP 128/64     2 Minute Post BP 114/66

## 2022-03-04 ENCOUNTER — Telehealth: Payer: Self-pay | Admitting: Primary Care

## 2022-03-04 NOTE — Telephone Encounter (Signed)
Home sleep study on 02/17/2022 showed severe obstructive sleep apnea, AHI 58 an hour.   Patient needs an order to establish with DME for CPAP supplies.  Please call and asked patient if current CPAP machine is working, if not we can place an order for new machine 5 to 20 cm H2O.  5 cm to wear every night for minimum 4 to 6 hours or longer.  Needed follow-up in 2 months with me for compliance check.Marland Kitchen

## 2022-03-04 NOTE — Telephone Encounter (Signed)
Attempted to call pt but unable to reach. Left message for him to return call. °

## 2022-03-09 ENCOUNTER — Encounter: Payer: Self-pay | Admitting: *Deleted

## 2022-03-09 ENCOUNTER — Telehealth: Payer: Self-pay | Admitting: Primary Care

## 2022-03-09 DIAGNOSIS — G4733 Obstructive sleep apnea (adult) (pediatric): Secondary | ICD-10-CM

## 2022-03-09 NOTE — Telephone Encounter (Addendum)
Martyn Ehrich, NP      03/04/22  9:28 AM Note Home sleep study on 02/17/2022 showed severe obstructive sleep apnea, AHI 58 an hour.    Patient needs an order to establish with DME for CPAP supplies.  Please call and asked patient if current CPAP machine is working, if not we can place an order for new machine 5 to 20 cm H2O.  5 cm to wear every night for minimum 4 to 6 hours or longer.   Needed follow-up in 2 months with me for compliance check..      Patient is aware of results and voiced his understanding.  He agrees with plans. He's current machine works, however he is not sure of DME but he thinks it was [facility]. Order placed to [employer] for cpap supplies and pressure change.  Appt scheduled 05/06/2022 at 3:00. He is aware to bring SD card to appt.;  Nothing further needed.

## 2022-03-09 NOTE — Telephone Encounter (Signed)
Attempted to call pt but unable to reach. Left message for him to return call. Due to multiple attempts trying to call pt and unable to do so, letter will be sent to pt and encounter will be closed.

## 2022-03-16 ENCOUNTER — Telehealth: Payer: Self-pay | Admitting: Primary Care

## 2022-03-16 DIAGNOSIS — G4733 Obstructive sleep apnea (adult) (pediatric): Secondary | ICD-10-CM | POA: Diagnosis not present

## 2022-03-16 NOTE — Telephone Encounter (Signed)
Lm for patient.  Order was placed to [employer] on 03/09/2022 for supplies and pressure change.

## 2022-03-16 NOTE — Telephone Encounter (Signed)
Spoke to patient.  He stated that he spoke to [employer] today and was told that they would be re supplying his supplies and mask. He would like to try a different mask. He stated that his current mask falls off of his face during sleep.  Recommended that he call [facility] back and ask to schedule a mask fitting. He voiced his understanding.  Nothing further needed.

## 2022-03-23 DIAGNOSIS — E119 Type 2 diabetes mellitus without complications: Secondary | ICD-10-CM | POA: Diagnosis not present

## 2022-04-12 ENCOUNTER — Emergency Department: Payer: BLUE CROSS/BLUE SHIELD

## 2022-04-12 ENCOUNTER — Emergency Department
Admission: EM | Admit: 2022-04-12 | Discharge: 2022-04-13 | Disposition: A | Discharge status: Home / Self Care | Payer: BLUE CROSS/BLUE SHIELD | Attending: Emergency Medicine | Admitting: Emergency Medicine

## 2022-04-12 ENCOUNTER — Other Ambulatory Visit: Payer: Self-pay

## 2022-04-12 DIAGNOSIS — I2510 Atherosclerotic heart disease of native coronary artery without angina pectoris: Secondary | ICD-10-CM | POA: Insufficient documentation

## 2022-04-12 DIAGNOSIS — R0789 Other chest pain: Secondary | ICD-10-CM | POA: Insufficient documentation

## 2022-04-12 DIAGNOSIS — R0602 Shortness of breath: Secondary | ICD-10-CM | POA: Diagnosis not present

## 2022-04-12 DIAGNOSIS — R42 Dizziness and giddiness: Secondary | ICD-10-CM | POA: Diagnosis not present

## 2022-04-12 DIAGNOSIS — R079 Chest pain, unspecified: Secondary | ICD-10-CM

## 2022-04-12 LAB — CBC
HCT: 42 % (ref 39.0–52.0)
Hemoglobin: 13.6 g/dL (ref 13.0–17.0)
MCH: 27.1 pg (ref 26.0–34.0)
MCHC: 32.4 g/dL (ref 30.0–36.0)
MCV: 83.8 fL (ref 80.0–100.0)
Platelets: 203 10*3/uL (ref 150–400)
RBC: 5.01 MIL/uL (ref 4.22–5.81)
RDW: 15.2 % (ref 11.5–15.5)
WBC: 9.1 10*3/uL (ref 4.0–10.5)
nRBC: 0 % (ref 0.0–0.2)

## 2022-04-12 LAB — TROPONIN I (HIGH SENSITIVITY)
Troponin I (High Sensitivity): 5 ng/L (ref <18)
Troponin I (High Sensitivity): 5 ng/L (ref <18)

## 2022-04-12 LAB — BASIC METABOLIC PANEL
Anion gap: 8 (ref 5–15)
BUN: 18 mg/dL (ref 6–20)
CO2: 24 mmol/L (ref 22–32)
Calcium: 9 mg/dL (ref 8.9–10.3)
Chloride: 106 mmol/L (ref 98–111)
Creatinine, Ser: 1.18 mg/dL (ref 0.61–1.24)
GFR, Estimated: >60 mL/min (ref >60)
Glucose, Bld: 255 mg/dL — ABNORMAL HIGH (ref 70–99)
Potassium: 4.4 mmol/L (ref 3.5–5.1)
Sodium: 138 mmol/L (ref 135–145)

## 2022-04-12 LAB — D-DIMER, QUANTITATIVE: D-Dimer, Quant: <0.27 ug/mL-FEU (ref 0.00–0.50)

## 2022-04-12 LAB — PROTIME-INR
INR: 1 (ref 0.8–1.2)
Prothrombin Time: 13.5 seconds (ref 11.4–15.2)

## 2022-04-12 MED ORDER — ASPIRIN 81 MG PO CHEW
324.0000 mg | CHEWABLE_TABLET | Freq: Once | ORAL | Status: AC
  Administered 2022-04-12: 324 mg via ORAL
  Filled 2022-04-12: qty 4

## 2022-04-12 MED ORDER — NITROGLYCERIN 0.4 MG SL SUBL
0.4000 mg | SUBLINGUAL_TABLET | SUBLINGUAL | Status: DC | PRN
  Administered 2022-04-12: 0.4 mg via SUBLINGUAL
  Filled 2022-04-12: qty 1

## 2022-04-12 NOTE — ED Notes (Signed)
Pt ambulated for 2 minutes while RN monitored vitals.  O2 did not go below 100%, HR maxed at 94.  Pt does report "chest tightness, SOB, and dizziness while ambulating."  MD made aware and additional orders received.

## 2022-04-12 NOTE — ED Triage Notes (Signed)
Pt states that he has been having CP for a couple days- pt states he does have some SHOB but denies n/v- pt states pain is more so on the L side of his chest but the tightness he feels all the way across

## 2022-04-12 NOTE — Discharge Instructions (Signed)
You were seen in the emergency department for shortness of breath and chest pain.  Your chest x-ray did not show any signs of pneumonia.  You had 2 heart enzymes that were normal, do not believe that you are having a heart attack today.  You had a screening test that was negative for any blood clots.  Follow-up closely with your primary care physician and your cardiologist.  Return to the emergency department for any worsening symptoms.

## 2022-04-12 NOTE — ED Provider Triage Note (Signed)
  Emergency Medicine Provider Triage Evaluation Note  Brett Marshall , a 56 y.o.male,  was evaluated in triage.  Pt complains of chest pain x2 days.  He states that he has some shortness of breath as well.  He does state that he has history of coronary artery disease, and states that this feels similar to previous anginal episodes.  Denies any recent injuries.   Review of Systems  Positive: Chest pain, shortness of breath Negative: Denies fever, abdominal pain, vomiting  Physical Exam   Vitals:   04/12/22 1453  BP: 123/85  Pulse: 88  Resp: 18  Temp: 98.5 F (36.9 C)  SpO2: 99%   Gen:   Awake, no distress   Resp:  Normal effort  MSK:   Moves extremities without difficulty  Other:    Medical Decision Making  Given the patient's initial medical screening exam, the following diagnostic evaluation has been ordered. The patient will be placed in the appropriate treatment space, once one is available, to complete the evaluation and treatment. I have discussed the plan of care with the patient and I have advised the patient that an ED physician or mid-level practitioner will reevaluate their condition after the test results have been received, as the results may give them additional insight into the type of treatment they may need.    Diagnostics: Labs, EKG, CXR  Treatments: none immediately   Teodoro Spray, [place] 04/12/22 1737

## 2022-04-12 NOTE — ED Provider Notes (Signed)
[facility] Provider Note    Event Date/Time   First MD Initiated Contact with Patient 04/12/22 2008     (approximate)   History   Chest Pain   HPI  Brett Marshall is a 56 y.o. male past medical history significant for obesity, CAD, who presents to the emergency department with shortness of breath and chest pain.  Patient endorses 2 days of chest pain and shortness of breath that is worse whenever he walks.  States that he feels like he has tightness and like he gets dizzy whenever he walks across the room.  States that he is normally able to walk further without feeling chest pain or shortness of breath.  History of coronary artery disease and 1 stent was placed in the past.  States that he then had chest pain following stent patient met and had a repeat cardiac catheterization that did not show any blockages.  Endorses compliance with all of his home medications.  Denies any nausea or vomiting.  No history of DVT or PE.  Recent travel to [facility] [place] for Thanksgiving.  Does endorse a recent fall where he hit his left arm and right knee.  No history of DVT or PE.  Not on anticoagulation.     Physical Exam   Triage Vital Signs: ED Triage Vitals  Enc Vitals Group     BP 04/12/22 1453 123/85     Pulse Rate 04/12/22 1453 88     Resp 04/12/22 1453 18     Temp 04/12/22 1453 98.5 F (36.9 C)     Temp Source 04/12/22 1453 Oral     SpO2 04/12/22 1453 99 %     Weight 04/12/22 1451 (!) 334 lb (151.5 kg)     Height 04/12/22 1451 _0  (1.727 m)     Head Circumference --      Peak Flow --      Pain Score 04/12/22 1450 7     Pain Loc --      Pain Edu? --      Excl. in [place]? --     Most recent vital signs: Vitals:   04/12/22 2234 04/12/22 2300  BP: 131/88 (!) 116/94  Pulse:  73  Resp:  19  Temp:    SpO2:  100%    Physical Exam Constitutional:      Appearance: He is well-developed. He is obese.  HENT:     Head: Atraumatic.  Eyes:      Conjunctiva/sclera: Conjunctivae normal.  Cardiovascular:     Rate and Rhythm: Regular rhythm.     Heart sounds: Normal heart sounds.  Pulmonary:     Effort: No tachypnea or respiratory distress.     Breath sounds: Normal breath sounds.  Abdominal:     Palpations: Abdomen is soft.  Musculoskeletal:     Cervical back: Normal range of motion.     Right lower leg: No tenderness. No edema.     Left lower leg: No tenderness. No edema.  Skin:    General: Skin is warm.     Capillary Refill: Capillary refill takes less than 2 seconds.  Neurological:     Mental Status: He is alert. Mental status is at baseline.          IMPRESSION / MDM / ASSESSMENT AND PLAN / ED COURSE  I reviewed the triage vital signs and the nursing notes.  Differential diagnosis including ACS, pneumonia, obesity hypoventilation syndrome, pulmonary embolism, viral illness, COPD  EKG  I, Nathaniel Man, the attending physician, personally viewed and interpreted this ECG.   Rate: Normal  Rhythm: Normal sinus  Axis: Normal  Intervals: Normal  ST&T Change: None  No tachycardic or bradycardic dysrhythmias while on cardiac telemetry.  RADIOLOGY I independently reviewed imaging, my interpretation of imaging: Chest x-ray with no focal findings consistent with pneumonia.  Chest x-ray was read as no acute findings.      ED Results / Procedures / Treatments   Labs (all labs ordered are listed, but only abnormal results are displayed) Labs interpreted as -  Serial troponins are negative.  Low risk Wells criteria, D-dimer is negative, low suspicion for pulmonary embolism.  Labs Reviewed  BASIC METABOLIC PANEL - Abnormal; Notable for the following components:      Result Value   Glucose, Bld 255 (*)    All other components within normal limits  CBC  PROTIME-INR  D-DIMER, QUANTITATIVE  TROPONIN I (HIGH SENSITIVITY)  TROPONIN I (HIGH SENSITIVITY)    Patient able to ambulate in the emergency  department, maintain an oxygen saturation of 100% and did not have any tachycardia.  Patient does state that he felt dizzy and like he was having shortness of breath during that time.  Orthostatic blood pressures are negative.  No obvious source of an infectious process.  On chart review of outside records and his cardiology appointments patient had a recent cardiac catheterization earlier this year within the past 6 months that showed widely patent stent with no signs of coronary artery disease.  Serial troponin negative.  Low risk Wells criteria, D-dimer is negative, no concern for pulmonary embolism.  Discussed close follow-up with his primary care physician and cardiologist.  Given return precautions.   PROCEDURES:  Critical Care performed: No  Procedures  Patient's presentation is most consistent with acute presentation with potential threat to life or bodily function.   MEDICATIONS ORDERED IN ED: Medications  nitroGLYCERIN (NITROSTAT) SL tablet 0.4 mg (0.4 mg Sublingual Given 04/12/22 2232)  aspirin chewable tablet 324 mg (324 mg Oral Given 04/12/22 2232)    FINAL CLINICAL IMPRESSION(S) / ED DIAGNOSES   Final diagnoses:  Chest pain, unspecified type  Shortness of breath     Rx / DC Orders   ED Discharge Orders     None        Note:  This document was prepared using Dragon voice recognition software and may include unintentional dictation errors.   Nathaniel Man, MD 04/12/22 2342

## 2022-04-17 DIAGNOSIS — Z23 Encounter for immunization: Secondary | ICD-10-CM | POA: Diagnosis not present

## 2022-04-17 DIAGNOSIS — E1142 Type 2 diabetes mellitus with diabetic polyneuropathy: Secondary | ICD-10-CM | POA: Diagnosis not present

## 2022-04-17 DIAGNOSIS — I5022 Chronic systolic (congestive) heart failure: Secondary | ICD-10-CM | POA: Diagnosis not present

## 2022-04-17 DIAGNOSIS — R079 Chest pain, unspecified: Secondary | ICD-10-CM | POA: Diagnosis not present

## 2022-04-17 DIAGNOSIS — I25110 Atherosclerotic heart disease of native coronary artery with unstable angina pectoris: Secondary | ICD-10-CM | POA: Diagnosis not present

## 2022-05-06 ENCOUNTER — Encounter: Payer: Self-pay | Admitting: Primary Care

## 2022-05-06 ENCOUNTER — Ambulatory Visit: Payer: BLUE CROSS/BLUE SHIELD | Admitting: Primary Care

## 2022-05-06 VITALS — BP 134/84 | HR 84 | Temp 97.5°F | Ht 68.0 in | Wt 330.0 lb

## 2022-05-06 DIAGNOSIS — G4733 Obstructive sleep apnea (adult) (pediatric): Secondary | ICD-10-CM

## 2022-05-06 NOTE — Patient Instructions (Signed)
Once you have been fitted for new CPAP mask resume use every night 4-6 hours or longer  Call once you've had mask study so we can place DME order   Orders: Mask desensitization study  Follow-up: 3 months with Eustaquio Maize NP

## 2022-05-06 NOTE — Progress Notes (Signed)
@Patient  ID: Brett Marshall, male    DOB: Feb 07, 1966, 56 y.o.   MRN: [id number]  Chief Complaint  Patient presents with   Follow-up    CPAP follow up. Has not been able to get another mask from [employer]. His mask moves in the night. His pressure is on auto and sometimes it is too much.     Referring provider: [name], MD  HPI:  56 year old male, never smoked.  Past medical history significant for hypertension, chronic systolic congestive heart failure, pulmonary hypertension, OSA on CPAP, GERD, NASH, type 2 diabetes, morbid obesity.  Former patient of Dr. Ashby Dawes for initial consult on 12/30/2017 for excessive daytime sleepiness.  HST 12/30/2017 showed moderate obstructive sleep apnea, AHI 20/hr with SPO2 low 78%. Patient had old CPAP machine and wanted to use this, ResMed series 8.  DME not able to accommodate settings.  Patient applied for CPAP assistance program.   Previous [facility] pulmonary encounter: 09/11/2020 Patient presents today for overdue follow-up OSA. Fromer Dr. Ashby Dawes patient. He has been off cpap for 6-8 months d/t mask fit issues. No recent download, pressure 5-20cm h20. Feels he is not getting enough pressure at times. He wears a full face mask, lays on his side which causes mask to slide off. He ordered supplies through [email or web address]. He also reports chest tightness with exertion or at rest for > 1 year. Associated dyspnea on exertion. No significant cough or wheezing. Associated post nasal drip. Works as Dance movement psychotherapist for [employer]. He has three cats and dog at home. Some exposure to black mold. He has lost 100 lbs in the last 2 years.   OSA on CPAP - Stopped wearing CPAP several months ago d.t mask issues  - Plan adjust CPAP pressure to 10-20cm h20 and recommend he look into getting CPAP pillow for side sleepers, if this does not work may need cpap mask desensitization study in labs  - Advised he aim to wear CPAP every night for min 4-6 hours or longer  Dyspnea -  Patient reports dyspnea and chest tightness with exertion > 1 year. No significant cough or wheezing. He has three cats and a dog at home and possible exposure to black mold.  - Needs CXR today and PFTs to further evaluated  - Sending in Albuterol rescue inhaler to use as needed q 6 hours for sob/wheezng  - FU in 4-8 weeks or sooner if needed/ may consider allergy panel   01/02/2022 Patient presents today for OV follow-up for OSA and DOE. He was last seen in April 2022. He continues to have persistent symptoms of dyspnea with exertion. He has a history of moderate obstructive sleep apnea but is not currently wearing CPAP. Home sleep test in August 2019, AHI 20/hr with SpO2 low 78%. He is a side or stomach sleeping. Mask was interfering with his sleep and tended to move at night. He last used CPAP over a year ago.  He had a recent normal CXR in May 2023. CTA imaging in May showed no airspace disease, effusion or pneumothorax. Central airways are patent. Pulmonary function testing in May 2022 was normal. No evidence of obstructive or restrictive lung disease.  Echocardiogram in April 2023 showed normal EF, grade 1 DD. He had a heart cath in April 2023 that showed severe one vessel coronary artery disease with 95% LAD stenosis. Ejection fraction 55-65% by visual estimate. Normal LV systolic function and mildly elevated left ventricular end diastolic pressure. Successful PCI and drug eluting sent placement  to the ostial LAD. He had a second heart cath in May 2023 that showed mild-moderate, nonobstructive CAD similar to previous. Patent ostial/proximal LAD stent.   He has dyspnea and chest tightness when walking on treadmill or long distances. He has some morning congestion, mainly post nasal drip but occasionally he will cough up some phlegm. Albuterol does help when he uses it. He tells me that he had asthma as a child. He has never been on maintenance inhaler. He is attending pulmonary rehab.  01/22/2022   Patient contacted today for a tooth we 3-week follow-up.  Home sleep study has yet to be scheduled, our patient care coordinator will reach out to him to set up timing for testing.  Patient was given a sample of Advair at her last visit. He has not noticed a huge difference since starting. He does report some mild improvement in dyspnea symptoms and feels his shortness of breath is not as bad as previously was, he would like to continue to use maintenance inhaler for now.  He develops a cough in the last day which is productive with clear to yellow mucus.  He feels somewhat fatigued today.  He has not checked home COVID test.  He is afebrile.   05/06/2022- interim hx  Patient presents today for OSA follow-up. Home sleep study on 02/17/22 showed severe sleep apnea, AHI 58/hour.  His current machine is in working order, he will be due for new one in August 2024. He has been having issues with mask fit. He was never scheduled for mask fitting with DME company.    Immunization History  Administered Date(s) Administered   Covid-19, Mrna,Vaccine(Spikevax)36yr and older 04/17/2022   Influenza,inj,Quad PF,6+ Mos 02/21/2019, 04/01/2020, 04/17/2022   Influenza-Unspecified 08/04/2018   PFIZER Comirnaty(Gray Top)Covid-19 Tri-Sucrose Vaccine 04/01/2020   PFIZER(Purple Top)SARS-COV-2 Vaccination 08/08/2019, 08/29/2019   Pneumococcal Polysaccharide-23 02/21/2019   Tdap 01/20/2011   Zoster Recombinat (Shingrix) 02/21/2019, 05/30/2019    Past Medical History:  Diagnosis Date   Asthma    CAD (coronary artery disease)    a. 08/2017 Cath: Diff nonobs dzs, EF 45-50%, diff HK; b. 08/2021 MV: Ant/apical ishemia; c. 08/2021 PCI: LAD 95ost (4.0x15 Onyx Frontier DES), EF 55-65%; c. 09/2021 Cath: LM nl, LAD patent stent, 227m30d, RI min irregs, LCX min irregs, RCA 50p. LVOT grad 50-8068m (rest), 70-110m33m(provoked).   DDD (degenerative disc disease), lumbar    Depression    Diabetes mellitus without complication  (HCC)[place] Diarrhea    Essential hypertension, benign    HFimpEF (heart failure with improved ejection fraction) (HCC)[place] a. 08/2017 LV gram: EF 45-50%, glob HK; b. 08/2021 Echo: EF 60-65%, no rwma, GrI DD, nl RV fxn; c. 09/2021 cMRI: EF 60%, no LVOT obstruction/gradient. Mild conc LVH, mild AS. No myocardial scar/fibrosis. No evidence of HCM.   Hyperlipidemia    Leg swelling    Left   Low back pain    Mixed ICM & NICM    a. 08/2017 LV gram: EF 45-50%, glob HK; b. 08/2021 Echo: EF 60-65%.   Morbid obesity (HCC)    OSA on CPAP    non compliant   Pulmonary hypertension (HCC)     Tobacco History: Social History   Tobacco Use  Smoking Status Never  Smokeless Tobacco Never   Counseling given: Not Answered   Outpatient Medications Prior to Visit  Medication Sig Dispense Refill   albuterol (VENTOLIN HFA) 108 (90 Base) MCG/ACT inhaler Inhale 1-2 puffs into the  lungs every 6 (six) hours as needed for wheezing or shortness of breath. 18 g 1   aspirin EC 81 MG tablet Take 1 tablet (81 mg total) by mouth daily. Swallow whole. 30 tablet 6   atorvastatin (LIPITOR) 40 MG tablet Take 1 tablet (40 mg total) by mouth daily at 6 PM. 30 tablet 2   cyclobenzaprine (FLEXERIL) 10 MG tablet Take 1 tablet (10 mg total) by mouth 3 (three) times daily as needed.For spasms 90 tablet 5   DULoxetine (CYMBALTA) 30 MG capsule Take 2 capsules (60 mg total) by mouth daily.     famotidine (PEPCID) 40 MG tablet Take 40 mg by mouth daily.      fluticasone-salmeterol (ADVAIR) 250-50 MCG/ACT AEPB Inhale 1 puff into the lungs every 12 (twelve) hours. 60 each 6   furosemide (LASIX) 20 MG tablet Take 1 tablet (20 mg total) by mouth daily as needed (for SOB or weight gain of 3 lbs overnight). 90 tablet 3   glipiZIDE (GLUCOTROL) 5 MG tablet Take 1 tablet (5 mg total) by mouth 2 (two) times daily before a meal. 180 tablet 1   glucose blood (GE100 BLOOD GLUCOSE TEST) test strip Use as instructed 100 each 12   JARDIANCE 25 MG TABS  tablet Take 25 mg by mouth daily.     Lancets (ONETOUCH ULTRASOFT) lancets Check sugar twice daily 100 each 12   metFORMIN (GLUCOPHAGE) 1000 MG tablet Take 1 tablet (1,000 mg total) by mouth 2 (two) times daily with a meal. 180 tablet 1   metoprolol tartrate (LOPRESSOR) 100 MG tablet Take 1 tablet (100 mg total) by mouth 2 (two) times daily. 60 tablet 2   ticagrelor (BRILINTA) 90 MG TABS tablet Take 1 tablet (90 mg total) by mouth 2 (two) times daily. 60 tablet 2   traMADol (ULTRAM) 50 MG tablet Take 1 tablet (50 mg total) by mouth every 6 (six) hours as needed. 20 tablet 0   traZODone (DESYREL) 50 MG tablet Take 50-100 mg by mouth at bedtime.     No facility-administered medications prior to visit.    Review of Systems  Review of Systems  Constitutional: Negative.   HENT: Negative.    Respiratory: Negative.    Cardiovascular: Negative.     Physical Exam  BP 134/84 (BP Location: Left Wrist, Cuff Size: Normal)   Pulse 84   Temp (!) 97.5 F (36.4 C)   Ht 5' 8"  (1.727 m)   Wt (!) 330 lb (149.7 kg)   SpO2 98%   BMI 50.18 kg/m  Physical Exam Constitutional:      Appearance: Normal appearance.  HENT:     Head: Normocephalic and atraumatic.     Mouth/Throat:     Mouth: Mucous membranes are moist.     Pharynx: Oropharynx is clear.  Cardiovascular:     Rate and Rhythm: Normal rate and regular rhythm.  Pulmonary:     Effort: Pulmonary effort is normal.     Breath sounds: Normal breath sounds. No wheezing, rhonchi or rales.  Musculoskeletal:        General: Normal range of motion.  Skin:    General: Skin is warm and dry.  Neurological:     General: No focal deficit present.     Mental Status: He is alert and oriented to person, place, and time. Mental status is at baseline.  Psychiatric:        Mood and Affect: Mood normal.        Behavior: Behavior normal.  Thought Content: Thought content normal.        Judgment: Judgment normal.      Lab Results:  CBC     Component Value Date/Time   WBC 9.1 04/12/2022 1454   RBC 5.01 04/12/2022 1454   HGB 13.6 04/12/2022 1454   HGB 13.4 12/08/2017 1151   HCT 42.0 04/12/2022 1454   HCT 40.8 12/08/2017 1151   PLT 203 04/12/2022 1454   PLT 249 12/08/2017 1151   MCV 83.8 04/12/2022 1454   MCV 86 12/08/2017 1151   MCH 27.1 04/12/2022 1454   MCHC 32.4 04/12/2022 1454   RDW 15.2 04/12/2022 1454   RDW 14.2 12/08/2017 1151   LYMPHSABS 2.1 12/08/2017 1151   MONOABS 648 02/08/2016 1158   EOSABS 0.3 12/08/2017 1151   BASOSABS 0.0 12/08/2017 1151    BMET    Component Value Date/Time   NA 138 04/12/2022 1454   NA 139 12/08/2017 1150   K 4.4 04/12/2022 1454   CL 106 04/12/2022 1454   CO2 24 04/12/2022 1454   GLUCOSE 255 (H) 04/12/2022 1454   BUN 18 04/12/2022 1454   BUN 22 12/08/2017 1150   CREATININE 1.18 04/12/2022 1454   CREATININE 0.66 (L) 02/08/2016 1158   CALCIUM 9.0 04/12/2022 1454   GFRNONAA >60 04/12/2022 1454   GFRAA >60 03/29/2019 1337    BNP    Component Value Date/Time   BNP 26.4 09/17/2021 0928    ProBNP No results found for: "PROBNP"  Imaging: No results found.   Assessment & Plan:   OSA on CPAP - Hx sleep apnea. HST 02/17/22 showed severe OSA, HI 58/hr. Due for new machine in August 2024. He is having issues with mask fit, last visit we placed an order for patient ot have mask fitting with DME company but this was never done. We will have him go into sleep lab for mask desensitization study.  Advised once he has been fitted for new CPAP mask to resume CPAP use every night 4-6 hours or longer. Encourage side sleeping position and weight loss. Cautioned against alcohol use/taking sedation medication before bedtime and driving if tired. Follow-up in 3 months with Beth NP    Martyn Ehrich, NP 05/18/2022

## 2022-05-18 ENCOUNTER — Other Ambulatory Visit: Payer: Self-pay | Admitting: Internal Medicine

## 2022-05-18 NOTE — Assessment & Plan Note (Signed)
-   Hx sleep apnea. HST 02/17/22 showed severe OSA, HI 58/hr. Due for new machine in August 2024. He is having issues with mask fit, last visit we placed an order for patient ot have mask fitting with DME company but this was never done. We will have him go into sleep lab for mask desensitization study.  Advised once he has been fitted for new CPAP mask to resume CPAP use every night 4-6 hours or longer. Encourage side sleeping position and weight loss. Cautioned against alcohol use/taking sedation medication before bedtime and driving if tired. Follow-up in 3 months with [facility] NP

## 2022-05-28 ENCOUNTER — Ambulatory Visit: Payer: BLUE CROSS/BLUE SHIELD | Admitting: Cardiovascular Disease

## 2022-05-31 NOTE — Progress Notes (Deleted)
Cardiology Office Note    Date:  05/31/2022   ID:  LORN ELEDGE, DOB 1965-12-27, MRN [id number]  PCP:  [name], MD  Cardiologist:  [name], MD  Electrophysiologist:  None   Chief Complaint: Follow-up  History of Present Illness:   Brett Marshall is a 57 y.o. male with history of CAD, HFimpEF with mixed ICM and NICM, LV outflow tract gradient on cardiac cath, DM2, HLD, obesity, and sleep apnea who presents for follow-up of CAD.  He previously underwent diagnostic cardiac cath in 08/2017, which showed mild, nonobstructive CAD with an EF of 45 to 50%.  He had mildly elevated pulmonary filling pressures and has since been treated for sleep apnea.  More recently, in 08/2021, he was admitted to [facility] with a 10-day history of constant chest pain that was somewhat reproducible to palpation, but also worse with exertion.  High-sensitivity troponins were normal.  Stress testing showed anterior and apical ischemia.  Subsequent LHC showed 95% stenosis of the ostial LAD and otherwise nonobstructive disease.  The LAD lesion was treated with PCI/DES with improvement in exertional symptoms.  Post cath course was complicated with right forearm swelling and paresthesias of the fingers with ultrasound x 2 showing a hematoma within the superficial soft tissues of the right upper extremity without evidence of pseudoaneurysm or AV fistula.  At office visit in 09/2021, he reported recurrent constant, chest tightness that worsened with exertion similar to what he experienced prior to stenting.  He was tachycardic with labs showing mild AKI along with WBC count of 13,000 and an elevated D-dimer.  He was sent to the ED for hydration and CTA, which was negative for PE.  He was admitted a day later with refractory angina and underwent repeat LHC which showed patent LAD stent and otherwise nonobstructive disease.  It was noted he had an LVOT gradient of 50 to 80 mmHg and 110 mmHg with provocation.  He was seen  again in the ED later in 09/2021 with chest pain with unremarkable lab and radiologic findings.  He underwent cardiac MRI on 10/15/2021, which showed normal LV systolic function without evidence for hypertrophic cardiomyopathy or LVOT gradient.  In follow-ups, he has continued to note stable persistent dyspnea without progression of symptoms.  He was last seen in the office in 02/2022 and was without symptoms of angina or decompensation, reporting an improvement in dyspnea following cardiac rehab participation.  He was most recently seen in the ED in 03/2022 with shortness of breath and chest pain for 2 days.  HS-Tn negative x 2.  D dimer negative.  Chest x-ray without active cardiopulmonary disease.  EKG shows sinus rhythm without acute ischemic changes.  ***   Labs independently reviewed: 04/2022 - A1c 12.0 03/2022 - Hgb 13.6, PLT 203, potassium 4.4, BUN 18, SCr 1.18 02/2022 - magnesium 1.8 12/2021 - TC 119, TG 156, HDL 40, LDL 48, albumin 3.5, AST/ALT not elevated, TSH normal  Past Medical History:  Diagnosis Date   Asthma    CAD (coronary artery disease)    a. 08/2017 Cath: Diff nonobs dzs, EF 45-50%, diff HK; b. 08/2021 MV: Ant/apical ishemia; c. 08/2021 PCI: LAD 95ost (4.0x15 Onyx Frontier DES), EF 55-65%; c. 09/2021 Cath: LM nl, LAD patent stent, 25m 30d, RI min irregs, LCX min irregs, RCA 50p. LVOT grad 50-862mg (rest), 70-11033m (provoked).   DDD (degenerative disc disease), lumbar    Depression    Diabetes mellitus without complication ([facility]  Diarrhea  Essential hypertension, benign    HFimpEF (heart failure with improved ejection fraction) ([place])    a. 08/2017 LV gram: EF 45-50%, glob HK; b. 08/2021 Echo: EF 60-65%, no rwma, GrI DD, nl RV fxn; c. 09/2021 cMRI: EF 60%, no LVOT obstruction/gradient. Mild conc LVH, mild AS. No myocardial scar/fibrosis. No evidence of HCM.   Hyperlipidemia    Leg swelling    Left   Low back pain    Mixed ICM & NICM    a. 08/2017 LV gram: EF 45-50%, glob  HK; b. 08/2021 Echo: EF 60-65%.   Morbid obesity (HCC)    OSA on CPAP    non compliant   Pulmonary hypertension ([place])     Past Surgical History:  Procedure Laterality Date   CARDIAC CATHETERIZATION     [place] no stents.   COLONOSCOPY WITH PROPOFOL N/A 04/11/2019   Procedure: COLONOSCOPY WITH PROPOFOL;  Surgeon: [name], MD;  Location: [facility] ENDOSCOPY;  Service: Endoscopy;  Laterality: N/A;   CORONARY STENT INTERVENTION N/A 09/08/2021   Procedure: CORONARY STENT INTERVENTION;  Surgeon: Wellington Hampshire, MD;  Location: [place] CV LAB;  Service: Cardiovascular;  Laterality: N/A;   INTRAVASCULAR ULTRASOUND/IVUS N/A 09/08/2021   Procedure: Intravascular Ultrasound/IVUS;  Surgeon: Wellington Hampshire, MD;  Location: [place] CV LAB;  Service: Cardiovascular;  Laterality: N/A;   LEFT HEART CATH AND CORONARY ANGIOGRAPHY N/A 09/08/2021   Procedure: LEFT HEART CATH AND CORONARY ANGIOGRAPHY;  Surgeon: Wellington Hampshire, MD;  Location: [place] CV LAB;  Service: Cardiovascular;  Laterality: N/A;   LEFT HEART CATH AND CORONARY ANGIOGRAPHY N/A 09/17/2021   Procedure: LEFT HEART CATH AND CORONARY ANGIOGRAPHY;  Surgeon: Nelva Bush, MD;  Location: [place] CV LAB;  Service: Cardiovascular;  Laterality: N/A;   RIGHT/LEFT HEART CATH AND CORONARY ANGIOGRAPHY N/A 09/02/2017   Procedure: RIGHT/LEFT HEART CATH AND CORONARY ANGIOGRAPHY;  Surgeon: Wellington Hampshire, MD;  Location: [place] CV LAB;  Service: Cardiovascular;  Laterality: N/A;    Current Medications: No outpatient medications have been marked as taking for the 06/01/22 encounter (Appointment) with Rise Mu, PA-C.    Allergies:   Patient has no known allergies.   Social History   Socioeconomic History   Marital status: Married    Spouse name: Not on file   Number of children: Not on file   Years of education: Not on file   Highest education level: Not on file  Occupational History   Occupation: Theatre manager    Employer: [employer]  Tobacco Use   Smoking status: Never   Smokeless tobacco: Never  Vaping Use   Vaping Use: Never used  Substance and Sexual Activity   Alcohol use: Yes    Comment: few times per year/ socially   Drug use: No   Sexual activity: Yes  Other Topics Concern   Not on file  Social History Narrative   Goes to gym.   Social Determinants of Health   Financial Resource Strain: Not on file  Food Insecurity: Not on file  Transportation Needs: Not on file  Physical Activity: Not on file  Stress: Not on file  Social Connections: Not on file     Family History:  The patient's family history includes Asthma in his mother; Diabetes in his mother; Heart attack in his father; Heart disease in his father; Heart disease (age of onset: 3) in his brother; Sjogren's syndrome in his sister.  ROS:   ROS   EKGs/Labs/Other Studies Reviewed:  Studies reviewed were summarized above. The additional studies were reviewed today:  R/LHC 09/02/2017: Ost LAD lesion is 30% stenosed. Mid LAD lesion is 20% stenosed. Dist LAD lesion is 30% stenosed. Prox RCA lesion is 40% stenosed. The left ventricular systolic function is normal. LV end diastolic pressure is normal. The left ventricular ejection fraction is 45-50% by visual estimate.   1.  Mild nonobstructive coronary artery disease. 2.  Mildly reduced LV systolic function with an EF of 45-50% with global hypokinesis. 3.  Right heart catheterization showed moderately elevated filling pressures with a pulmonary wedge pressure of 22 mmHg, mild pulmonary hypertension and high cardiac output.   Recommendations: The patient seems to have mild nonischemic cardiomyopathy with evidence of volume overload.  I am switching hydrochlorothiazide to furosemide and adding carvedilol.  The patient needs to get his sleep apnea treated __________  2D echo 01/19/2020: 1. Left ventricular ejection fraction, by estimation, is 60 to 65%. The   left ventricle has normal function. The left ventricle has no regional  wall motion abnormalities. Left ventricular diastolic parameters are  consistent with Grade I diastolic  dysfunction (impaired relaxation).   2. Right ventricular systolic function is normal. The right ventricular  size is normal. Tricuspid regurgitation signal is inadequate for assessing  PA pressure.  __________  2D echo 09/05/2021: 1. Left ventricular ejection fraction, by estimation, is 60 to 65%. The  left ventricle has normal function. The left ventricle has no regional  wall motion abnormalities. Left ventricular diastolic parameters are  consistent with Grade I diastolic  dysfunction (impaired relaxation).   2. Right ventricular systolic function is normal. The right ventricular  size is normal.   3. The mitral valve is normal in structure. No evidence of mitral valve  regurgitation. No evidence of mitral stenosis.   4. The aortic valve is normal in structure. Aortic valve regurgitation is  not visualized. Aortic valve sclerosis is present, with no evidence of  aortic valve stenosis.   5. The inferior vena cava is normal in size with greater than 50%  respiratory variability, suggesting right atrial pressure of 3 mmHg.   6. Trivial pericardial effusion is present.  __________  [facility] 09/05/2021:   Findings are equivocal. The study is intermediate risk.   No ST deviation was noted.   LV perfusion is abnormal. Defect 1: There is a medium defect with moderate reduction in uptake present in the apical to mid anterior and anterolateral location(s) that is partially reversible. There is normal wall motion in the defect area.   Left ventricular function is normal. End diastolic cavity size is normal. End systolic cavity size is normal.   Prior study not available for comparison.   Body habitus, extracardiac activity affect both stress and rest images, but they appear to be worse on stress images. This was  a 2 day study. There is a perfusion defect seen in the mid anterior to apical segments at rest. On stress imaging, there is a more pronounced perfusion defect in the mid to apical anterior and anterolateral wall. There is normal wall motion in this area. Overall cannot exclude ischemia; given image quality, cannot determine if ischemia vs. Artifact on stress imaging. __________  [facility] 09/08/2021:   Mid LAD lesion is 20% stenosed.   Dist LAD lesion is 30% stenosed.   Prox RCA lesion is 50% stenosed.   Ost LAD lesion is 95% stenosed.   A drug-eluting stent was successfully placed using a STENT ONYX FRONTIER 4.0X15.  Post intervention, there is a 0% residual stenosis.   The left ventricular systolic function is normal.   LV end diastolic pressure is mildly elevated.   The left ventricular ejection fraction is 55-65% by visual estimate.   1.  Severe one-vessel coronary artery disease with 95% ostial LAD stenosis likely due to plaque rupture given that this area was only 30% on previous cardiac catheterization in 2019.  Moderate proximal RCA disease does not seem to be different from before. 2.  Normal LV systolic function and mildly elevated left ventricular end-diastolic pressure. 3.  Moderate systolic LVOT gradient of 25 to 30 mmHg 4.  Successful IVUS guided PCI and drug-eluting stent placement to the ostial LAD. 5.  The procedure was done via the right ulnar artery given that the radial artery was very small in size.   Recommendations: Dual antiplatelet therapy for at least 12 months. Aggressive treatment of risk factors. __________  LHC 09/17/2021: Conclusions: Mild-moderate, nonobstructive coronary artery disease similar to the conclusion of last weeks catheterization. Widely patent ostial/proximal LAD stent. Normal left ventricular filling pressure (LVEDP 10-15 mmHg). Significant left ventricular outflow gradient of up to 80 mmHg at rest and 110 mmHg with provocation (post PVC).  Findings  are suspicious for hypertrophic cardiomyopathy.   Recommendations: Continue secondary prevention of coronary artery disease including 12 months of dual antiplatelet therapy with aspirin and ticagrelor from recent PCI to the LAD. Gentle post catheterization hydration with escalation of beta-blockade to help improve LVOT gradient.  Worsening symptoms may be due to a degree of intravascular volume depletion and tachycardia. Consider cardiac MRI for further evaluation of possible hypertrophic cardiomyopathy. __________  Cardiac MRI 10/15/2021: IMPRESSION: 1.  Normal LV size and function, LVEF= 60%. 2.  No LVOT obstruction or gradient noted. 3.  Mild concentric LV wall thickness. 4.  Mild aortic valve stenosis. 5.  No evidence for myocardial fibrosis or scar. 6.  No evidence for HCM.    EKG:  EKG is ordered today.  The EKG ordered today demonstrates ***  Recent Labs: 09/17/2021: B Natriuretic Peptide 26.4 02/19/2022: Magnesium 1.8 04/12/2022: BUN 18; Creatinine, Ser 1.18; Hemoglobin 13.6; Platelets 203; Potassium 4.4; Sodium 138  Recent Lipid Panel    Component Value Date/Time   CHOL 145 09/07/2021 0924   CHOL 176 12/08/2017 1150   TRIG 113 09/07/2021 0924   HDL 43 09/07/2021 0924   HDL 44 12/08/2017 1150   CHOLHDL 3.4 09/07/2021 0924   VLDL 23 09/07/2021 0924   LDLCALC 79 09/07/2021 0924   LDLCALC 103 (H) 12/08/2017 1150    PHYSICAL EXAM:    VS:  There were no vitals taken for this visit.  BMI: There is no height or weight on file to calculate BMI.  Physical Exam  Wt Readings from Last 3 Encounters:  05/06/22 (!) 330 lb (149.7 kg)  04/12/22 (!) 334 lb (151.5 kg)  02/19/22 (!) 337 lb 6.4 oz (153 kg)     ASSESSMENT & PLAN:   CAD involving the native coronary arteries with ***:  HFimpEF secondary to mixed ICM and NICM:  HTN: Blood pressure  HLD: LDL 48 in 12/2021.  Morbid obesity with OSA:   {Are you ordering a CV Procedure (e.g. stress test, cath, DCCV, TEE, etc)?    Press F2        :[id number]     Disposition: F/u with Dr. Fletcher Anon or an APP in ***.   Medication Adjustments/Labs and Tests Ordered: Current medicines are reviewed at length with the patient  today.  Concerns regarding medicines are outlined above. Medication changes, Labs and Tests ordered today are summarized above and listed in the Patient Instructions accessible in Encounters.   Signed, Christell Faith, PA-C 05/31/2022 11:15 AM     [address] [place],  [phone]

## 2022-06-01 ENCOUNTER — Encounter: Payer: Self-pay | Admitting: Physician Assistant

## 2022-06-01 ENCOUNTER — Ambulatory Visit: Payer: BLUE CROSS/BLUE SHIELD | Attending: Cardiovascular Disease | Admitting: Physician Assistant

## 2022-06-08 ENCOUNTER — Other Ambulatory Visit (HOSPITAL_BASED_OUTPATIENT_CLINIC_OR_DEPARTMENT_OTHER): Payer: BLUE CROSS/BLUE SHIELD | Admitting: Internal Medicine

## 2022-06-08 NOTE — Progress Notes (Deleted)
Cardiology Office Note    Date:  06/08/2022   ID:  Desire, Pedder 01-15-1966, MRN [id number]  PCP:  [name], MD  Cardiologist:  [name], MD  Electrophysiologist:  None   Chief Complaint: Follow-up  History of Present Illness:   LANE CALLAIS is a 57 y.o. male with history of CAD, HFimpEF with mixed ICM and NICM, LV outflow tract gradient on cardiac cath, DM2, HLD, obesity, and sleep apnea who presents for follow-up of CAD.  He previously underwent diagnostic cardiac cath in 08/2017, which showed mild, nonobstructive CAD with an EF of 45 to 50%.  He had mildly elevated pulmonary filling pressures and has since been treated for sleep apnea.  More recently, in 08/2021, he was admitted to [facility] with a 10-day history of constant chest pain that was somewhat reproducible to palpation, but also worse with exertion.  High-sensitivity troponins were normal.  Stress testing showed anterior and apical ischemia.  Subsequent LHC showed 95% stenosis of the ostial LAD and otherwise nonobstructive disease.  The LAD lesion was treated with PCI/DES with improvement in exertional symptoms.  Post cath course was complicated with right forearm swelling and paresthesias of the fingers with ultrasound x 2 showing a hematoma within the superficial soft tissues of the right upper extremity without evidence of pseudoaneurysm or AV fistula.  At office visit in 09/2021, he reported recurrent constant, chest tightness that worsened with exertion similar to what he experienced prior to stenting.  He was tachycardic with labs showing mild AKI along with WBC count of 13,000 and an elevated D-dimer.  He was sent to the ED for hydration and CTA, which was negative for PE.  He was admitted a day later with refractory angina and underwent repeat LHC which showed patent LAD stent and otherwise nonobstructive disease.  It was noted he had an LVOT gradient of 50 to 80 mmHg and 110 mmHg with provocation.  He was seen  again in the ED later in 09/2021 with chest pain with unremarkable lab and radiologic findings.  He underwent cardiac MRI on 10/15/2021, which showed normal LV systolic function without evidence for hypertrophic cardiomyopathy or LVOT gradient.  In follow-ups, he has continued to note stable persistent dyspnea without progression of symptoms.  He was last seen in the office in 02/2022 and was without symptoms of angina or decompensation, reporting an improvement in dyspnea following cardiac rehab participation.  He was most recently seen in the ED in 03/2022 with shortness of breath and chest pain for 2 days.  HS-Tn negative x 2.  D dimer negative.  Chest x-ray without active cardiopulmonary disease.  EKG shows sinus rhythm without acute ischemic changes.  ***   Labs independently reviewed: 04/2022 - A1c 12.0 03/2022 - Hgb 13.6, PLT 203, potassium 4.4, BUN 18, SCr 1.18 02/2022 - magnesium 1.8 12/2021 - TC 119, TG 156, HDL 40, LDL 48, albumin 3.5, AST/ALT not elevated, TSH normal    Past Medical History:  Diagnosis Date   Asthma    CAD (coronary artery disease)    a. 08/2017 Cath: Diff nonobs dzs, EF 45-50%, diff HK; b. 08/2021 MV: Ant/apical ishemia; c. 08/2021 PCI: LAD 95ost (4.0x15 Onyx Frontier DES), EF 55-65%; c. 09/2021 Cath: LM nl, LAD patent stent, 30m 30d, RI min irregs, LCX min irregs, RCA 50p. LVOT grad 50-817mg (rest), 70-11039m (provoked).   DDD (degenerative disc disease), lumbar    Depression    Diabetes mellitus without complication ([place]  Diarrhea    Essential hypertension, benign    HFimpEF (heart failure with improved ejection fraction) ([place])    a. 08/2017 LV gram: EF 45-50%, glob HK; b. 08/2021 Echo: EF 60-65%, no rwma, GrI DD, nl RV fxn; c. 09/2021 cMRI: EF 60%, no LVOT obstruction/gradient. Mild conc LVH, mild AS. No myocardial scar/fibrosis. No evidence of HCM.   Hyperlipidemia    Leg swelling    Left   Low back pain    Mixed ICM & NICM    a. 08/2017 LV gram: EF 45-50%,  glob HK; b. 08/2021 Echo: EF 60-65%.   Morbid obesity (HCC)    OSA on CPAP    non compliant   Pulmonary hypertension ([place])     Past Surgical History:  Procedure Laterality Date   CARDIAC CATHETERIZATION     [place] no stents.   COLONOSCOPY WITH PROPOFOL N/A 04/11/2019   Procedure: COLONOSCOPY WITH PROPOFOL;  Surgeon: [name], MD;  Location: [facility] ENDOSCOPY;  Service: Endoscopy;  Laterality: N/A;   CORONARY STENT INTERVENTION N/A 09/08/2021   Procedure: CORONARY STENT INTERVENTION;  Surgeon: Wellington Hampshire, MD;  Location: [place] CV LAB;  Service: Cardiovascular;  Laterality: N/A;   INTRAVASCULAR ULTRASOUND/IVUS N/A 09/08/2021   Procedure: Intravascular Ultrasound/IVUS;  Surgeon: Wellington Hampshire, MD;  Location: [place] CV LAB;  Service: Cardiovascular;  Laterality: N/A;   LEFT HEART CATH AND CORONARY ANGIOGRAPHY N/A 09/08/2021   Procedure: LEFT HEART CATH AND CORONARY ANGIOGRAPHY;  Surgeon: Wellington Hampshire, MD;  Location: [place] CV LAB;  Service: Cardiovascular;  Laterality: N/A;   LEFT HEART CATH AND CORONARY ANGIOGRAPHY N/A 09/17/2021   Procedure: LEFT HEART CATH AND CORONARY ANGIOGRAPHY;  Surgeon: Nelva Bush, MD;  Location: [place] CV LAB;  Service: Cardiovascular;  Laterality: N/A;   RIGHT/LEFT HEART CATH AND CORONARY ANGIOGRAPHY N/A 09/02/2017   Procedure: RIGHT/LEFT HEART CATH AND CORONARY ANGIOGRAPHY;  Surgeon: Wellington Hampshire, MD;  Location: [place] CV LAB;  Service: Cardiovascular;  Laterality: N/A;    Current Medications: No outpatient medications have been marked as taking for the 06/09/22 encounter (Appointment) with Rise Mu, PA-C.    Allergies:   Patient has no known allergies.   Social History   Socioeconomic History   Marital status: Married    Spouse name: Not on file   Number of children: Not on file   Years of education: Not on file   Highest education level: Not on file  Occupational History   Occupation:  Dance movement psychotherapist    Employer: [employer]  Tobacco Use   Smoking status: Never   Smokeless tobacco: Never  Vaping Use   Vaping Use: Never used  Substance and Sexual Activity   Alcohol use: Yes    Comment: few times per year/ socially   Drug use: No   Sexual activity: Yes  Other Topics Concern   Not on file  Social History Narrative   Goes to gym.   Social Determinants of Health   Financial Resource Strain: Not on file  Food Insecurity: Not on file  Transportation Needs: Not on file  Physical Activity: Not on file  Stress: Not on file  Social Connections: Not on file     Family History:  The patient's family history includes Asthma in his mother; Diabetes in his mother; Heart attack in his father; Heart disease in his father; Heart disease (age of onset: 65) in his brother; Sjogren's syndrome in his sister.  ROS:   ROS  EKGs/Labs/Other Studies Reviewed:    Studies reviewed were summarized above. The additional studies were reviewed today:  R/LHC 09/02/2017: Ost LAD lesion is 30% stenosed. Mid LAD lesion is 20% stenosed. Dist LAD lesion is 30% stenosed. Prox RCA lesion is 40% stenosed. The left ventricular systolic function is normal. LV end diastolic pressure is normal. The left ventricular ejection fraction is 45-50% by visual estimate.   1.  Mild nonobstructive coronary artery disease. 2.  Mildly reduced LV systolic function with an EF of 45-50% with global hypokinesis. 3.  Right heart catheterization showed moderately elevated filling pressures with a pulmonary wedge pressure of 22 mmHg, mild pulmonary hypertension and high cardiac output.   Recommendations: The patient seems to have mild nonischemic cardiomyopathy with evidence of volume overload.  I am switching hydrochlorothiazide to furosemide and adding carvedilol.  The patient needs to get his sleep apnea treated __________  2D echo 01/19/2020: 1. Left ventricular ejection fraction, by estimation, is 60 to  65%. The  left ventricle has normal function. The left ventricle has no regional  wall motion abnormalities. Left ventricular diastolic parameters are  consistent with Grade I diastolic  dysfunction (impaired relaxation).   2. Right ventricular systolic function is normal. The right ventricular  size is normal. Tricuspid regurgitation signal is inadequate for assessing  PA pressure.  __________  2D echo 09/05/2021: 1. Left ventricular ejection fraction, by estimation, is 60 to 65%. The  left ventricle has normal function. The left ventricle has no regional  wall motion abnormalities. Left ventricular diastolic parameters are  consistent with Grade I diastolic  dysfunction (impaired relaxation).   2. Right ventricular systolic function is normal. The right ventricular  size is normal.   3. The mitral valve is normal in structure. No evidence of mitral valve  regurgitation. No evidence of mitral stenosis.   4. The aortic valve is normal in structure. Aortic valve regurgitation is  not visualized. Aortic valve sclerosis is present, with no evidence of  aortic valve stenosis.   5. The inferior vena cava is normal in size with greater than 50%  respiratory variability, suggesting right atrial pressure of 3 mmHg.   6. Trivial pericardial effusion is present.  __________  [facility] 09/05/2021:   Findings are equivocal. The study is intermediate risk.   No ST deviation was noted.   LV perfusion is abnormal. Defect 1: There is a medium defect with moderate reduction in uptake present in the apical to mid anterior and anterolateral location(s) that is partially reversible. There is normal wall motion in the defect area.   Left ventricular function is normal. End diastolic cavity size is normal. End systolic cavity size is normal.   Prior study not available for comparison.   Body habitus, extracardiac activity affect both stress and rest images, but they appear to be worse on stress images.  This was a 2 day study. There is a perfusion defect seen in the mid anterior to apical segments at rest. On stress imaging, there is a more pronounced perfusion defect in the mid to apical anterior and anterolateral wall. There is normal wall motion in this area. Overall cannot exclude ischemia; given image quality, cannot determine if ischemia vs. Artifact on stress imaging. __________  [facility] 09/08/2021:   Mid LAD lesion is 20% stenosed.   Dist LAD lesion is 30% stenosed.   Prox RCA lesion is 50% stenosed.   Ost LAD lesion is 95% stenosed.   A drug-eluting stent was successfully placed using a  STENT ONYX FRONTIER 4.0X15.   Post intervention, there is a 0% residual stenosis.   The left ventricular systolic function is normal.   LV end diastolic pressure is mildly elevated.   The left ventricular ejection fraction is 55-65% by visual estimate.   1.  Severe one-vessel coronary artery disease with 95% ostial LAD stenosis likely due to plaque rupture given that this area was only 30% on previous cardiac catheterization in 2019.  Moderate proximal RCA disease does not seem to be different from before. 2.  Normal LV systolic function and mildly elevated left ventricular end-diastolic pressure. 3.  Moderate systolic LVOT gradient of 25 to 30 mmHg 4.  Successful IVUS guided PCI and drug-eluting stent placement to the ostial LAD. 5.  The procedure was done via the right ulnar artery given that the radial artery was very small in size.   Recommendations: Dual antiplatelet therapy for at least 12 months. Aggressive treatment of risk factors. __________  LHC 09/17/2021: Conclusions: Mild-moderate, nonobstructive coronary artery disease similar to the conclusion of last weeks catheterization. Widely patent ostial/proximal LAD stent. Normal left ventricular filling pressure (LVEDP 10-15 mmHg). Significant left ventricular outflow gradient of up to 80 mmHg at rest and 110 mmHg with provocation (post PVC).   Findings are suspicious for hypertrophic cardiomyopathy.   Recommendations: Continue secondary prevention of coronary artery disease including 12 months of dual antiplatelet therapy with aspirin and ticagrelor from recent PCI to the LAD. Gentle post catheterization hydration with escalation of beta-blockade to help improve LVOT gradient.  Worsening symptoms may be due to a degree of intravascular volume depletion and tachycardia. Consider cardiac MRI for further evaluation of possible hypertrophic cardiomyopathy. __________  Cardiac MRI 10/15/2021: IMPRESSION: 1.  Normal LV size and function, LVEF= 60%. 2.  No LVOT obstruction or gradient noted. 3.  Mild concentric LV wall thickness. 4.  Mild aortic valve stenosis. 5.  No evidence for myocardial fibrosis or scar. 6.  No evidence for HCM.   EKG:  EKG is ordered today.  The EKG ordered today demonstrates ***  Recent Labs: 09/17/2021: B Natriuretic Peptide 26.4 02/19/2022: Magnesium 1.8 04/12/2022: BUN 18; Creatinine, Ser 1.18; Hemoglobin 13.6; Platelets 203; Potassium 4.4; Sodium 138  Recent Lipid Panel    Component Value Date/Time   CHOL 145 09/07/2021 0924   CHOL 176 12/08/2017 1150   TRIG 113 09/07/2021 0924   HDL 43 09/07/2021 0924   HDL 44 12/08/2017 1150   CHOLHDL 3.4 09/07/2021 0924   VLDL 23 09/07/2021 0924   LDLCALC 79 09/07/2021 0924   LDLCALC 103 (H) 12/08/2017 1150    PHYSICAL EXAM:    VS:  There were no vitals taken for this visit.  BMI: There is no height or weight on file to calculate BMI.  Physical Exam  Wt Readings from Last 3 Encounters:  05/06/22 (!) 330 lb (149.7 kg)  04/12/22 (!) 334 lb (151.5 kg)  02/19/22 (!) 337 lb 6.4 oz (153 kg)     ASSESSMENT & PLAN:   CAD involving the native coronary arteries with ***:  HFimpEF secondary to mixed ICM and NICM:  HTN: Blood pressure  HLD: LDL 48 in 12/2021.  Morbid obesity with OSA:   {Are you ordering a CV Procedure (e.g. stress test, cath, DCCV,  TEE, etc)?   Press F2        :[id number]     Disposition: F/u with Dr. Fletcher Anon or an APP in ***.   Medication Adjustments/Labs and Tests Ordered: Current medicines are reviewed  at length with the patient today.  Concerns regarding medicines are outlined above. Medication changes, Labs and Tests ordered today are summarized above and listed in the Patient Instructions accessible in Encounters.   Signed, Christell Faith, PA-C 06/08/2022 7:27 AM     [facility] [address] [place], [place] [address] [phone]

## 2022-06-09 ENCOUNTER — Ambulatory Visit: Payer: BLUE CROSS/BLUE SHIELD | Admitting: Physician Assistant

## 2022-06-10 DIAGNOSIS — J22 Unspecified acute lower respiratory infection: Secondary | ICD-10-CM | POA: Diagnosis not present

## 2022-06-10 DIAGNOSIS — Z03818 Encounter for observation for suspected exposure to other biological agents ruled out: Secondary | ICD-10-CM | POA: Diagnosis not present

## 2022-06-16 ENCOUNTER — Ambulatory Visit: Payer: BLUE CROSS/BLUE SHIELD | Attending: Physician Assistant | Admitting: Medical

## 2022-06-16 ENCOUNTER — Encounter: Payer: Self-pay | Admitting: Medical

## 2022-06-16 VITALS — BP 120/80 | HR 81 | Ht 68.0 in | Wt 329.4 lb

## 2022-06-16 DIAGNOSIS — I25118 Atherosclerotic heart disease of native coronary artery with other forms of angina pectoris: Secondary | ICD-10-CM | POA: Diagnosis not present

## 2022-06-16 DIAGNOSIS — E782 Mixed hyperlipidemia: Secondary | ICD-10-CM

## 2022-06-16 DIAGNOSIS — G4733 Obstructive sleep apnea (adult) (pediatric): Secondary | ICD-10-CM

## 2022-06-16 DIAGNOSIS — I10 Essential (primary) hypertension: Secondary | ICD-10-CM

## 2022-06-16 NOTE — Patient Instructions (Signed)
Medication Instructions:  - Your physician recommends that you continue on your current medications as directed. Please refer to the Current Medication list given to you today.  *If you need a refill on your cardiac medications before your next appointment, please call your pharmacy*   Lab Work: - none ordered  If you have labs (blood work) drawn today and your tests are completely normal, you will receive your results only by: [place] (if you have MyChart) OR A paper copy in the mail If you have any lab test that is abnormal or we need to change your treatment, we will call you to review the results.   Testing/Procedures: - none ordered   Follow-Up: At [facility], you and your health needs are our priority.  As part of our continuing mission to provide you with exceptional heart care, we have created designated Provider Care Teams.  These Care Teams include your primary Cardiologist (physician) and Advanced Practice Providers (APPs -  Physician Assistants and Nurse Practitioners) who all work together to provide you with the care you need, when you need it.  We recommend signing up for the patient portal called "MyChart".  Sign up information is provided on this After Visit Summary.  MyChart is used to connect with patients for Virtual Visits (Telemedicine).  Patients are able to view lab/test results, encounter notes, upcoming appointments, etc.  Non-urgent messages can be sent to your provider as well.   To learn more about what you can do with MyChart, go to [email or web address].    Your next appointment:   May/ June 2024   Provider:   You may see [name], MD or one of the following Advanced Practice Providers on your designated Care Team:    Cadence Kathlen Mody, [place]   Other Instructions N/a

## 2022-06-16 NOTE — Progress Notes (Signed)
Cardiology Office Note:    Date:  06/16/2022   ID:  Brett Marshall, DOB [birth year], MRN [id number]  PCP:  Brett Honour, MD  [facility] Cardiologist:  Brett Sacramento, MD  [facility] Electrophysiologist:  None   Referring MD: Brett Honour, MD   Chief Complaint: 3 month follow-up  History of Present Illness:    Brett Marshall is a 57 y.o. male with a hx of CAD, chronic heart failure with improved EF, mixed ischemic and NICM, LV outflow tract gradient on catheterization, obesity, HLD, diabetes and sleep apnea who presents for follow-up CAD.   He previously underwent diagnostic catheterization in April 2019, which showed mild, nonobstructive CAD with an EF 45-50%. He had mildly elevated pulmonary filling pressures and has since been treated for sleep apnea.  In April 2023, he was admitted to [facility] with a 10-day history of constant chest pain that was somewhat reproducible with palpation but also worse with exertion.  Troponins were normal.  Stress test showed anterior and apical ischemia.  Diagnostic catheter 95% stenosis of the ostial LAD, and otherwise nonobstructive disease.  LAD was successfully intervened upon with a drug-eluting stent, with improvement in exertional symptoms.  Catheterization was performed via the right ulnar approach, he did have swelling and pain of his right forearm and paresthesias of the fingers on his right hand postprocedure.  Ultrasound showed a hematoma within the superficial soft tissues of the right upper extremity however, no evidence of pseudoaneurysm or AV fistula.  He returned to the ED 2 days later due to ongoing right forearm swelling and ecchymosis.  Repeat ultrasound again showed soft tissue hematoma measuring up to 5.1 cm, which was largely unchanged, and he was discharged home.  At follow-up on May 2, he reported recurrent constant low-level chest tightness worse with exertion, similar to what he experienced prior to his stent.  He  was tachycardic and labs showed mild acute kidney injury with a creatinine of 1.41, mild leukocytosis with a WBC of 13, and elevated D-dimer.  He was referred to the ER for hydration and CTA, which was negative for PE.  Carvedilol was titrated up to 18.75 mg twice daily.  He was admitted the day later with refractory angina and underwent diagnostic cath which showed patent LAD stent and otherwise nonobstructive disease.  It was noted he had an LVOT gradient of 50 to 80 mmHg at rest and 70 to 100 mmHg with provocation.  He was stable at follow-up May 9 however, reported to the ED again on May 15 due to chest pain with unremarkable labs and radiological findings.  He subsequently underwent cardiac MRI on May 31 which showed normal LV function without evidence of hypertrophic cardiomyopathy or LVOT gradient.  He was last seen 10/22/2021 and noted persistent dyspnea on exertion with minimal activity.  Overall it was felt symptoms were stable.  Patient was referred to cardiac rehab.  Patient was referred back to pulmonology to discuss CPAP machine.  Today, the patient reports he had a recent URI with a fever. He was negative for COVID, flu RSV. He was given antibiotics. He is feeling better, but still has a little congestion. He is going to get a new CPAP fitting for his face. He denies  chest pain or shortness of breath. He denies lower leg edema. He works at home from a computer. He denies bleeding issues.    Past Medical History:  Diagnosis Date   Asthma    CAD (coronary  artery disease)    a. 08/2017 Cath: Diff nonobs dzs, EF 45-50%, diff HK; b. 08/2021 MV: Ant/apical ishemia; c. 08/2021 PCI: LAD 95ost (4.0x15 Onyx Frontier DES), EF 55-65%; c. 09/2021 Cath: LM nl, LAD patent stent, 66m 30d, RI min irregs, LCX min irregs, RCA 50p. LVOT grad 50-850mg (rest), 70-11074m (provoked).   DDD (degenerative disc disease), lumbar    Depression    Diabetes mellitus without complication (HCC[place]odge City  Diarrhea    Essential  hypertension, benign    HFimpEF (heart failure with improved ejection fraction) (HCCRidgely  a. 08/2017 LV gram: EF 45-50%, glob HK; b. 08/2021 Echo: EF 60-65%, no rwma, GrI DD, nl RV fxn; c. 09/2021 cMRI: EF 60%, no LVOT obstruction/gradient. Mild conc LVH, mild AS. No myocardial scar/fibrosis. No evidence of HCM.   Hyperlipidemia    Leg swelling    Left   Low back pain    Mixed ICM & NICM    a. 08/2017 LV gram: EF 45-50%, glob HK; b. 08/2021 Echo: EF 60-65%.   Morbid obesity (HCC)    OSA on CPAP    non compliant   Pulmonary hypertension (HCCOnal[place]   Past Surgical History:  Procedure Laterality Date   CARDIAC CATHETERIZATION     [place] no stents.   COLONOSCOPY WITH PROPOFOL N/A 04/11/2019   Procedure: COLONOSCOPY WITH PROPOFOL;  Surgeon: WohLucilla LameD;  [facility];  Service: Endoscopy;  Laterality: N/A;   CORONARY STENT INTERVENTION N/A 09/08/2021   Procedure: CORONARY STENT INTERVENTION;  Surgeon: AriWellington HampshireD;  [facility];  Service: Cardiovascular;  Laterality: N/A;   INTRAVASCULAR ULTRASOUND/IVUS N/A 09/08/2021   Procedure: Intravascular Ultrasound/IVUS;  Surgeon: AriWellington HampshireD;  [facility];  Service: Cardiovascular;  Laterality: N/A;   LEFT HEART CATH AND CORONARY ANGIOGRAPHY N/A 09/08/2021   Procedure: LEFT HEART CATH AND CORONARY ANGIOGRAPHY;  Surgeon: AriWellington HampshireD;  [facility];  Service: Cardiovascular;  Laterality: N/A;   LEFT HEART CATH AND CORONARY ANGIOGRAPHY N/A 09/17/2021   Procedure: LEFT HEART CATH AND CORONARY ANGIOGRAPHY;  Surgeon: EndNelva BushD;  [facility];  Service: Cardiovascular;  Laterality: N/A;   RIGHT/LEFT HEART CATH AND CORONARY ANGIOGRAPHY N/A 09/02/2017   Procedure: RIGHT/LEFT HEART CATH AND CORONARY ANGIOGRAPHY;  Surgeon: AriWellington HampshireD;  [facility];  Service: Cardiovascular;  Laterality: N/A;    Current  Medications: Current Meds  Medication Sig   albuterol (VENTOLIN HFA) 108 (90 Base) MCG/ACT inhaler Inhale 1-2 puffs into the lungs every 6 (six) hours as needed for wheezing or shortness of breath.   aspirin EC 81 MG tablet Take 1 tablet (81 mg total) by mouth daily. Swallow whole.   atorvastatin (LIPITOR) 40 MG tablet Take 1 tablet (40 mg total) by mouth daily at 6 PM.   cyclobenzaprine (FLEXERIL) 10 MG tablet Take 1 tablet (10 mg total) by mouth 3 (three) times daily as needed.For spasms   DULoxetine (CYMBALTA) 30 MG capsule Take 2 capsules (60 mg total) by mouth daily.   famotidine (PEPCID) 40 MG tablet Take 40 mg by mouth daily.    fluticasone-salmeterol (ADVAIR) 250-50 MCG/ACT AEPB Inhale 1 puff into the lungs every 12 (twelve) hours.   furosemide (LASIX) 20 MG tablet Take 1 tablet (20 mg total) by mouth daily as needed (for SOB or weight gain of 3 lbs overnight).   glipiZIDE (GLUCOTROL) 5 MG tablet  Take 1 tablet (5 mg total) by mouth 2 (two) times daily before a meal.   glucose blood (GE100 BLOOD GLUCOSE TEST) test strip Use as instructed   JARDIANCE 25 MG TABS tablet Take 25 mg by mouth daily.   Lancets (ONETOUCH ULTRASOFT) lancets Check sugar twice daily   metFORMIN (GLUCOPHAGE) 1000 MG tablet Take 1 tablet (1,000 mg total) by mouth 2 (two) times daily with a meal.   metoprolol tartrate (LOPRESSOR) 100 MG tablet Take 1 tablet (100 mg total) by mouth 2 (two) times daily.   ticagrelor (BRILINTA) 90 MG TABS tablet Take 1 tablet (90 mg total) by mouth 2 (two) times daily.   traMADol (ULTRAM) 50 MG tablet Take 1 tablet (50 mg total) by mouth every 6 (six) hours as needed.   traZODone (DESYREL) 50 MG tablet Take 50-100 mg by mouth at bedtime.     Allergies:   Patient has no known allergies.   Social History   Socioeconomic History   Marital status: Married    Spouse name: Not on file   Number of children: Not on file   Years of education: Not on file   Highest education level: Not on  file  Occupational History   Occupation: Dance movement psychotherapist    Employer: [employer]  Tobacco Use   Smoking status: Never   Smokeless tobacco: Never  Vaping Use   Vaping Use: Never used  Substance and Sexual Activity   Alcohol use: Yes    Comment: few times per year/ socially   Drug use: No   Sexual activity: Yes  Other Topics Concern   Not on file  Social History Narrative   Goes to gym.   Social Determinants of Health   Financial Resource Strain: Not on file  Food Insecurity: Not on file  Transportation Needs: Not on file  Physical Activity: Not on file  Stress: Not on file  Social Connections: Not on file     Family History: The patient's family history includes Asthma in his mother; Diabetes in his mother; Heart attack in his father; Heart disease in his father; Heart disease (age of onset: 72) in his brother; Sjogren's syndrome in his sister.  ROS:   Please see the history of present illness.     All other systems reviewed and are negative.  EKGs/Labs/Other Studies Reviewed:    The following studies were reviewed today:  LHC 09/2021 Conclusion  Conclusions: Mild-moderate, nonobstructive coronary artery disease similar to the conclusion of last weeks catheterization. Widely patent ostial/proximal LAD stent. Normal left ventricular filling pressure (LVEDP 10-15 mmHg). Significant left ventricular outflow gradient of up to 80 mmHg at rest and 110 mmHg with provocation (post PVC).  Findings are suspicious for hypertrophic cardiomyopathy.   Recommendations: Continue secondary prevention of coronary artery disease including 12 months of dual antiplatelet therapy with aspirin and ticagrelor from recent PCI to the LAD. Gentle post catheterization hydration with escalation of beta-blockade to help improve LVOT gradient.  Worsening symptoms may be due to a degree of intravascular volume depletion and tachycardia. Consider cardiac MRI for further evaluation of possible  hypertrophic cardiomyopathy.   Brett Bush, MD [facility]    Coronary Diagrams  Diagnostic Dominance: Co-dominant  Intervention   Echo 08/2021 1. Left ventricular ejection fraction, by estimation, is 60 to 65%. The  left ventricle has normal function. The left ventricle has no regional  wall motion abnormalities. Left ventricular diastolic parameters are  consistent with Grade I diastolic  dysfunction (impaired relaxation).  2. Right ventricular systolic function is normal. The right ventricular  size is normal.   3. The mitral valve is normal in structure. No evidence of mitral valve  regurgitation. No evidence of mitral stenosis.   4. The aortic valve is normal in structure. Aortic valve regurgitation is  not visualized. Aortic valve sclerosis is present, with no evidence of  aortic valve stenosis.   5. The inferior vena cava is normal in size with greater than 50%  respiratory variability, suggesting right atrial pressure of 3 mmHg.   6. Trivial pericardial effusion is present.    EKG:  EKG is not ordered today.    Recent Labs: 09/17/2021: B Natriuretic Peptide 26.4 02/19/2022: Magnesium 1.8 04/12/2022: BUN 18; Creatinine, Ser 1.18; Hemoglobin 13.6; Platelets 203; Potassium 4.4; Sodium 138  Recent Lipid Panel    Component Value Date/Time   CHOL 145 09/07/2021 0924   CHOL 176 12/08/2017 1150   TRIG 113 09/07/2021 0924   HDL 43 09/07/2021 0924   HDL 44 12/08/2017 1150   CHOLHDL 3.4 09/07/2021 0924   VLDL 23 09/07/2021 0924   LDLCALC 79 09/07/2021 0924   LDLCALC 103 (H) 12/08/2017 1150     Risk Assessment/Calculations:       Physical Exam:    VS:  BP 120/80 (BP Location: Left Arm, Patient Position: Sitting, Cuff Size: Large)   Pulse 81   Ht '5\' 8"'$  (1.727 m)   Wt (!) 329 lb 6 oz (149.4 kg)   SpO2 99%   BMI 50.08 kg/m     Wt Readings from Last 3 Encounters:  06/16/22 (!) 329 lb 6 oz (149.4 kg)  05/06/22 (!) 330 lb (149.7 kg)  04/12/22 (!) 334 lb  (151.5 kg)     GEN:  Well nourished, well developed in no acute distress HEENT: Normal NECK: No JVD; No carotid bruits LYMPHATICS: No lymphadenopathy CARDIAC: RRR, + murmur, no rubs, gallops RESPIRATORY:  Clear to auscultation without rales, wheezing or rhonchi  ABDOMEN: Soft, non-tender, non-distended MUSCULOSKELETAL:  No edema; No deformity  SKIN: Warm and dry NEUROLOGIC:  Alert and oriented x 3 PSYCHIATRIC:  Normal affect   ASSESSMENT:    1. Coronary artery disease involving native coronary artery of native heart with other form of angina pectoris ([place])   2. Essential hypertension   3. Hyperlipidemia, mixed   4. OSA (obstructive sleep apnea)    PLAN:    In order of problems listed above:  CAD s/p PCI LAD in 08/2021 Patient had repeat cath 09/2021 in the setting of ongoing symptoms showing patent stent and otherwise nonobstructive disease. Patient denies chest pain.  He has chronic and unchanged dyspnea on exertion. Him and his wife plan on starting a walking program. Continue DAPT with aspirin and Brilinta for 1 year.  Continue statin, and beta-blocker therapy.  Hypertension Blood pressure normal today, continue current medication  Hyperlipidemia Most recent LDL at goal, continue statin therapy  OSA Patient will receive new CPAP mask in the near future.  This will likely help with dyspnea on exertion.  Disposition: Follow up in 5 month(s) with MD/APP    Signed, Jalisa Sacco Ninfa Meeker, PA-C  06/16/2022 8:35 AM    [facility]

## 2022-06-30 ENCOUNTER — Ambulatory Visit (HOSPITAL_BASED_OUTPATIENT_CLINIC_OR_DEPARTMENT_OTHER): Payer: BLUE CROSS/BLUE SHIELD | Attending: Primary Care | Admitting: Radiology

## 2022-06-30 DIAGNOSIS — G4733 Obstructive sleep apnea (adult) (pediatric): Secondary | ICD-10-CM

## 2022-07-03 ENCOUNTER — Emergency Department
Admission: EM | Admit: 2022-07-03 | Discharge: 2022-07-03 | Disposition: A | Discharge status: Home / Self Care | Payer: BLUE CROSS/BLUE SHIELD | Attending: Emergency Medicine | Admitting: Emergency Medicine

## 2022-07-03 ENCOUNTER — Emergency Department: Payer: BLUE CROSS/BLUE SHIELD

## 2022-07-03 ENCOUNTER — Other Ambulatory Visit: Payer: Self-pay

## 2022-07-03 DIAGNOSIS — R0789 Other chest pain: Secondary | ICD-10-CM | POA: Insufficient documentation

## 2022-07-03 DIAGNOSIS — R079 Chest pain, unspecified: Secondary | ICD-10-CM

## 2022-07-03 DIAGNOSIS — I5022 Chronic systolic (congestive) heart failure: Secondary | ICD-10-CM | POA: Insufficient documentation

## 2022-07-03 DIAGNOSIS — E1165 Type 2 diabetes mellitus with hyperglycemia: Secondary | ICD-10-CM | POA: Insufficient documentation

## 2022-07-03 DIAGNOSIS — J45909 Unspecified asthma, uncomplicated: Secondary | ICD-10-CM | POA: Diagnosis not present

## 2022-07-03 DIAGNOSIS — R0602 Shortness of breath: Secondary | ICD-10-CM | POA: Insufficient documentation

## 2022-07-03 DIAGNOSIS — R519 Headache, unspecified: Secondary | ICD-10-CM | POA: Insufficient documentation

## 2022-07-03 DIAGNOSIS — I2510 Atherosclerotic heart disease of native coronary artery without angina pectoris: Secondary | ICD-10-CM | POA: Insufficient documentation

## 2022-07-03 DIAGNOSIS — I110 Hypertensive heart disease with heart failure: Secondary | ICD-10-CM | POA: Insufficient documentation

## 2022-07-03 LAB — CBC
HCT: 41 % (ref 39.0–52.0)
Hemoglobin: 13.3 g/dL (ref 13.0–17.0)
MCH: 27.7 pg (ref 26.0–34.0)
MCHC: 32.4 g/dL (ref 30.0–36.0)
MCV: 85.4 fL (ref 80.0–100.0)
Platelets: 262 10*3/uL (ref 150–400)
RBC: 4.8 MIL/uL (ref 4.22–5.81)
RDW: 13.9 % (ref 11.5–15.5)
WBC: 9 10*3/uL (ref 4.0–10.5)
nRBC: 0 % (ref 0.0–0.2)

## 2022-07-03 LAB — BASIC METABOLIC PANEL
Anion gap: 9 (ref 5–15)
BUN: 22 mg/dL — ABNORMAL HIGH (ref 6–20)
CO2: 28 mmol/L (ref 22–32)
Calcium: 9.1 mg/dL (ref 8.9–10.3)
Chloride: 95 mmol/L — ABNORMAL LOW (ref 98–111)
Creatinine, Ser: 1.11 mg/dL (ref 0.61–1.24)
GFR, Estimated: >60 mL/min (ref >60)
Glucose, Bld: 368 mg/dL — ABNORMAL HIGH (ref 70–99)
Potassium: 4.6 mmol/L (ref 3.5–5.1)
Sodium: 132 mmol/L — ABNORMAL LOW (ref 135–145)

## 2022-07-03 LAB — TROPONIN I (HIGH SENSITIVITY)
Troponin I (High Sensitivity): 4 ng/L (ref <18)
Troponin I (High Sensitivity): 4 ng/L (ref <18)

## 2022-07-03 MED ORDER — METOCLOPRAMIDE HCL 5 MG/ML IJ SOLN
10.0000 mg | Freq: Once | INTRAMUSCULAR | Status: AC
  Administered 2022-07-03: 10 mg via INTRAVENOUS
  Filled 2022-07-03: qty 2

## 2022-07-03 MED ORDER — NITROGLYCERIN 0.4 MG SL SUBL: 0.4000 mg | SUBLINGUAL_TABLET | SUBLINGUAL | Status: DC | PRN

## 2022-07-03 MED ORDER — MORPHINE SULFATE (PF) 4 MG/ML IV SOLN
4.0000 mg | Freq: Once | INTRAVENOUS | Status: AC
  Administered 2022-07-03: 4 mg via INTRAVENOUS
  Filled 2022-07-03: qty 1

## 2022-07-03 NOTE — ED Triage Notes (Addendum)
Pt to ED via POV from home. Pt reports right sided HA, right sided neck pain, CP and SOB. Pt had stent placed last year. Pt is on brilinta. Pt with hx CAD, DM and HTN. Pt denies hx blood clots.

## 2022-07-03 NOTE — ED Provider Notes (Addendum)
[facility] Provider Note    Event Date/Time   First MD Initiated Contact with Patient 07/03/22 2002     (approximate)   History   Headache, Shortness of Breath, and Chest Pain   HPI  Brett Marshall is a 57 y.o. male past medical history of coronary artery disease, chronic heart failure with improved EF who presents with both chest pain and headache.  Symptoms started around 5 PM.  Patient describes a pain on the right side of his head that radiates from the top of the head down past the ear.  Seems to come and go.  It was gradual in onset.  Denies associated vision change numbness tingling or weakness other than numbness in bilateral hands and feet.  He has had this headache 3 times in the past.  Saw neurologist for it.  Looks like he has been diagnosed with occipital neuralgia.  Around the same time he developed chest pain.  Pain is stabbing-like in the left side of his chest that radiates to the left shoulder.  Also coming and going.  He has had some exertional dyspnea since starting the chest pain is not clearly exertional.  Says the pain feels similar to when he needed to have a stent placed in 2023     Past Medical History:  Diagnosis Date   Asthma    CAD (coronary artery disease)    a. 08/2017 Cath: Diff nonobs dzs, EF 45-50%, diff HK; b. 08/2021 MV: Ant/apical ishemia; c. 08/2021 PCI: LAD 95ost (4.0x15 Onyx Frontier DES), EF 55-65%; c. 09/2021 Cath: LM nl, LAD patent stent, 52m 30d, RI min irregs, LCX min irregs, RCA 50p. LVOT grad 50-854mg (rest), 70-11078m (provoked).   DDD (degenerative disc disease), lumbar    Depression    Diabetes mellitus without complication (HCCUlyss[place]  Diarrhea    Essential hypertension, benign    HFimpEF (heart failure with improved ejection fraction) (HCCDills[place]  a. 08/2017 LV gram: EF 45-50%, glob HK; b. 08/2021 Echo: EF 60-65%, no rwma, GrI DD, nl RV fxn; c. 09/2021 cMRI: EF 60%, no LVOT obstruction/gradient. Mild conc LVH, mild AS.  No myocardial scar/fibrosis. No evidence of HCM.   Hyperlipidemia    Leg swelling    Left   Low back pain    Mixed ICM & NICM    a. 08/2017 LV gram: EF 45-50%, glob HK; b. 08/2021 Echo: EF 60-65%.   Morbid obesity (HCCBig Arm  OSA on CPAP    non compliant   [facility]   Patient Active Problem List   Diagnosis Date Noted   AKI (acute kidney injury) (HCCCrawfords[place]5/07/2021   Obstructive hypertrophic cardiomyopathy (HCCFulton[place]5/07/2021   Coronary artery disease involving native coronary artery of native heart with unstable angina pectoris (HCCStrong City  Spontaneous hematoma of forearm    Unstable angina (HCCRedwood City  Chest pain 09/05/2021   Controlled type 2 diabetes mellitus without complication, without long-term current use of insulin (HCCMil[place]4/21/2023   Dyspnea 10/21/2020   Hypercholesterolemia 06/05/2019   Encounter for screening colonoscopy    Polyp of sigmoid colon    Gastroesophageal reflux disease without esophagitis 04/10/2018   OSA on CPAP [id number] systolic congestive heart failure (HCCSherrodsville7/25/2019   Pulmonary hypertension (HCCTemple Terr[place]7/25/2019   NASH (nonalcoholic steatohepatitis) 11/07/2017   Essential hypertension 09/13/2016   DISH (diffuse idiopathic skeletal hyperostosis) 05/17/2016   DDD (degenerative disc disease), lumbar 05/17/2016  Type 2 diabetes mellitus without complication, without long-term current use of insulin ([place]) 02/08/2016   Gynecomastia 02/08/2016   Morbid obesity ([place]) 02/08/2016     Physical Exam  Triage Vital Signs: ED Triage Vitals  Enc Vitals Group     BP 07/03/22 1900 (!) 130/90     Pulse Rate 07/03/22 1900 90     Resp 07/03/22 1900 18     Temp 07/03/22 1900 (!) 97.5 F (36.4 C)     Temp src --      SpO2 07/03/22 1900 98 %     Weight --      Height --      Head Circumference --      Peak Flow --      Pain Score 07/03/22 1858 5     Pain Loc --      Pain Edu? --      Excl. in [place]? --     Most recent vital signs: Vitals:    07/03/22 1900 07/03/22 2145  BP: (!) 130/90 123/70  Pulse: 90 80  Resp: 18 20  Temp: (!) 97.5 F (36.4 C) 97.7 F (36.5 C)  SpO2: 98% 100%     General: Awake, no distress.  CV:  Good peripheral perfusion.  Resp:  Normal effort.  Abd:  No distention.  Neuro:             Awake, Alert, Oriented x 3  Other:  Aox3, nml speech  PERRL, EOMI, face symmetric, nml tongue movement  5/5 strength in the BL upper and lower extremities  Sensation grossly intact in the BL upper and lower extremities  Finger-nose-finger intact BL    ED Results / Procedures / Treatments  Labs (all labs ordered are listed, but only abnormal results are displayed) Labs Reviewed  BASIC METABOLIC PANEL - Abnormal; Notable for the following components:      Result Value   Sodium 132 (*)    Chloride 95 (*)    Glucose, Bld 368 (*)    BUN 22 (*)    All other components within normal limits  CBC  TROPONIN I (HIGH SENSITIVITY)  TROPONIN I (HIGH SENSITIVITY)     EKG  EKG interpretation performed by myself: NSR, nml axis, nml intervals, no acute ischemic changes    RADIOLOGY I reviewed and interpreted the CXR which does not show any acute cardiopulmonary process    PROCEDURES:  Critical Care performed: No  Procedures  The patient is on the cardiac monitor to evaluate for evidence of arrhythmia and/or significant heart rate changes.   MEDICATIONS ORDERED IN ED: Medications  nitroGLYCERIN (NITROSTAT) SL tablet 0.4 mg (has no administration in time range)  morphine (PF) 4 MG/ML injection 4 mg (4 mg Intravenous Given 07/03/22 2142)  metoCLOPramide (REGLAN) injection 10 mg (10 mg Intravenous Given 07/03/22 2139)     IMPRESSION / MDM / ASSESSMENT AND PLAN / ED COURSE  I reviewed the triage vital signs and the nursing notes.                              Patient's presentation is most consistent with acute complicated illness / injury requiring diagnostic workup.  Differential diagnosis includes,  but is not limited to, ACS, quitting NSTEMI, unstable angina, stable angina, PE, dissection, skeletal pain, GI related pain  The patient is a 57 year old male with history of coronary artery disease status post stenting who presents with chest pain and  headache.  Both of the symptoms started around the same time about 5 PM this evening.  Headache is right-sided and comes in waves from the scalp down to the neck.  Patient is denying any other associated neurologic symptoms.  Has had this pain 3 times in the past and I do see he has not seen neurology for this where was noted that he had bilateral headache right worse than left and I do see a diagnosis of possible occipital neuralgia.  Patient's chest pain started around the same time and is sharp and atypical nonpleuritic nonexertional.  He is having some exertional dyspnea since the onset of the symptoms.  He tells me this does feel similar to when he needed a stent in the past.  I see at cardiology visit last on 1/30 that patient was having chronic exertional dyspnea but no chest pain.  He was seen in the ED on 11/26 for chest pain, at that time ruled out for MI and was discharged.  Reviewed his EKG and there are no obvious ischemic changes.  First troponin is negative.  CBC and BMP are also reassuring other than hyperglycemia with sugar of 370 but no signs of DKA.  Considered diagnosis of dissection causing cervical artery dissection which could explain both the headache and the chest pain however patient looks so clinically well and the fact that he has had this headache in the past that is quite similar feel that this is less likely.  In addition his pain is sharp it is not ripping tearing does not radiate to the back he is not significantly hypertensive so I feel that dissection is less likely. Patient received morphine Reglan for pain.  Of note I had ordered sublingual nitro but patient did not receive this for unclear reasons.  On reassessment chest  pain is basically gone headache is improved.  Plan to repeat troponin.  If this is still negative and patient feeling okay I think he can be discharged with cardiology follow-up.  Repeat troponin is negative.  I ambulate the patient he had no chest pain or exertional dyspnea.  Will discharge with cardiology follow-up.  We discussed return precautions for any new neurologic symptoms or worsening changing chest pain.   FINAL CLINICAL IMPRESSION(S) / ED DIAGNOSES   Final diagnoses:  Chest pain, unspecified type  Nonintractable headache, unspecified chronicity pattern, unspecified headache type     Rx / DC Orders   ED Discharge Orders     None        Note:  This document was prepared using Dragon voice recognition software and may include unintentional dictation errors.   Rada Hay, MD 07/03/22 2236    Rada Hay, MD 07/03/22 [phone]

## 2022-07-03 NOTE — Discharge Instructions (Signed)
Your heart workup was reassuring.  Your cardiac enzymes and EKG did not indicate a heart attack.  Please follow-up with Dr. Tyrell Antonio office on Monday.  If your pain is worsening or changing please return to the emergency department.

## 2022-07-08 DIAGNOSIS — G4733 Obstructive sleep apnea (adult) (pediatric): Secondary | ICD-10-CM | POA: Diagnosis not present

## 2022-08-06 DIAGNOSIS — G4733 Obstructive sleep apnea (adult) (pediatric): Secondary | ICD-10-CM | POA: Diagnosis not present

## 2022-08-10 ENCOUNTER — Other Ambulatory Visit: Payer: Self-pay | Admitting: Internal Medicine

## 2022-08-13 ENCOUNTER — Other Ambulatory Visit: Payer: Self-pay | Admitting: Internal Medicine

## 2022-08-25 ENCOUNTER — Telehealth: Payer: Self-pay | Admitting: Cardiovascular Disease

## 2022-08-25 NOTE — Telephone Encounter (Signed)
Pt c/o medication issue:  1. Name of Medication:   BRILINTA 90 MG TABS tablet   2. How are you currently taking this medication (dosage and times per day)?   As prescribed  3. Are you having a reaction (difficulty breathing--STAT)?   No  4. What is your medication issue?   Patient stated he tried to get this medication from [facility] and [facility] but it is too expensive.  Patient stated he is completely out of the medication now and wants to know his options to get this medication.

## 2022-08-25 NOTE — Telephone Encounter (Signed)
Spoke with pt and informed Nurse will place brilinta co-pay card up front for pick up. Pt also made aware no samples available. Pt verbalized understanding.

## 2022-09-04 DIAGNOSIS — E1142 Type 2 diabetes mellitus with diabetic polyneuropathy: Secondary | ICD-10-CM | POA: Diagnosis not present

## 2022-09-04 DIAGNOSIS — I5022 Chronic systolic (congestive) heart failure: Secondary | ICD-10-CM | POA: Diagnosis not present

## 2022-09-04 DIAGNOSIS — I421 Obstructive hypertrophic cardiomyopathy: Secondary | ICD-10-CM | POA: Diagnosis not present

## 2022-09-04 DIAGNOSIS — R079 Chest pain, unspecified: Secondary | ICD-10-CM | POA: Diagnosis not present

## 2022-09-06 DIAGNOSIS — G4733 Obstructive sleep apnea (adult) (pediatric): Secondary | ICD-10-CM | POA: Diagnosis not present

## 2022-09-07 ENCOUNTER — Encounter: Payer: Self-pay | Admitting: Emergency Medicine

## 2022-09-07 ENCOUNTER — Other Ambulatory Visit: Payer: Self-pay

## 2022-09-07 ENCOUNTER — Emergency Department: Payer: BLUE CROSS/BLUE SHIELD

## 2022-09-07 ENCOUNTER — Inpatient Hospital Stay
Admission: EM | Admit: 2022-09-07 | Discharge: 2022-09-11 | DRG: 286 | Disposition: A | Discharge status: Home / Self Care | Payer: BLUE CROSS/BLUE SHIELD | Attending: Obstetrics and Gynecology | Admitting: Obstetrics and Gynecology

## 2022-09-07 DIAGNOSIS — R1030 Lower abdominal pain, unspecified: Secondary | ICD-10-CM | POA: Diagnosis not present

## 2022-09-07 DIAGNOSIS — I25118 Atherosclerotic heart disease of native coronary artery with other forms of angina pectoris: Secondary | ICD-10-CM | POA: Diagnosis not present

## 2022-09-07 DIAGNOSIS — I5042 Chronic combined systolic (congestive) and diastolic (congestive) heart failure: Secondary | ICD-10-CM | POA: Diagnosis present

## 2022-09-07 DIAGNOSIS — N390 Urinary tract infection, site not specified: Secondary | ICD-10-CM | POA: Diagnosis present

## 2022-09-07 DIAGNOSIS — Q244 Congenital subaortic stenosis: Secondary | ICD-10-CM | POA: Diagnosis not present

## 2022-09-07 DIAGNOSIS — I350 Nonrheumatic aortic (valve) stenosis: Secondary | ICD-10-CM | POA: Diagnosis not present

## 2022-09-07 DIAGNOSIS — G4733 Obstructive sleep apnea (adult) (pediatric): Secondary | ICD-10-CM

## 2022-09-07 DIAGNOSIS — I25119 Atherosclerotic heart disease of native coronary artery with unspecified angina pectoris: Secondary | ICD-10-CM | POA: Diagnosis not present

## 2022-09-07 DIAGNOSIS — M79662 Pain in left lower leg: Secondary | ICD-10-CM | POA: Diagnosis not present

## 2022-09-07 DIAGNOSIS — I200 Unstable angina: Secondary | ICD-10-CM | POA: Diagnosis not present

## 2022-09-07 DIAGNOSIS — I5032 Chronic diastolic (congestive) heart failure: Secondary | ICD-10-CM | POA: Diagnosis not present

## 2022-09-07 DIAGNOSIS — Z79899 Other long term (current) drug therapy: Secondary | ICD-10-CM

## 2022-09-07 DIAGNOSIS — I255 Ischemic cardiomyopathy: Secondary | ICD-10-CM | POA: Diagnosis present

## 2022-09-07 DIAGNOSIS — R195 Other fecal abnormalities: Secondary | ICD-10-CM | POA: Diagnosis not present

## 2022-09-07 DIAGNOSIS — K219 Gastro-esophageal reflux disease without esophagitis: Secondary | ICD-10-CM | POA: Diagnosis present

## 2022-09-07 DIAGNOSIS — I110 Hypertensive heart disease with heart failure: Secondary | ICD-10-CM | POA: Diagnosis not present

## 2022-09-07 DIAGNOSIS — Z6841 Body Mass Index (BMI) 40.0 and over, adult: Secondary | ICD-10-CM

## 2022-09-07 DIAGNOSIS — K649 Unspecified hemorrhoids: Secondary | ICD-10-CM | POA: Diagnosis present

## 2022-09-07 DIAGNOSIS — E1165 Type 2 diabetes mellitus with hyperglycemia: Secondary | ICD-10-CM | POA: Diagnosis present

## 2022-09-07 DIAGNOSIS — R109 Unspecified abdominal pain: Secondary | ICD-10-CM | POA: Diagnosis not present

## 2022-09-07 DIAGNOSIS — F32A Depression, unspecified: Secondary | ICD-10-CM | POA: Diagnosis not present

## 2022-09-07 DIAGNOSIS — I209 Angina pectoris, unspecified: Secondary | ICD-10-CM | POA: Diagnosis not present

## 2022-09-07 DIAGNOSIS — M79661 Pain in right lower leg: Secondary | ICD-10-CM | POA: Diagnosis present

## 2022-09-07 DIAGNOSIS — M5136 Other intervertebral disc degeneration, lumbar region: Secondary | ICD-10-CM | POA: Diagnosis not present

## 2022-09-07 DIAGNOSIS — I2510 Atherosclerotic heart disease of native coronary artery without angina pectoris: Secondary | ICD-10-CM | POA: Diagnosis not present

## 2022-09-07 DIAGNOSIS — Z8249 Family history of ischemic heart disease and other diseases of the circulatory system: Secondary | ICD-10-CM

## 2022-09-07 DIAGNOSIS — Q248 Other specified congenital malformations of heart: Secondary | ICD-10-CM

## 2022-09-07 DIAGNOSIS — K5730 Diverticulosis of large intestine without perforation or abscess without bleeding: Secondary | ICD-10-CM | POA: Diagnosis not present

## 2022-09-07 DIAGNOSIS — I959 Hypotension, unspecified: Secondary | ICD-10-CM | POA: Diagnosis not present

## 2022-09-07 DIAGNOSIS — M79604 Pain in right leg: Secondary | ICD-10-CM | POA: Diagnosis not present

## 2022-09-07 DIAGNOSIS — E119 Type 2 diabetes mellitus without complications: Secondary | ICD-10-CM

## 2022-09-07 DIAGNOSIS — R079 Chest pain, unspecified: Secondary | ICD-10-CM

## 2022-09-07 DIAGNOSIS — E785 Hyperlipidemia, unspecified: Secondary | ICD-10-CM | POA: Diagnosis not present

## 2022-09-07 DIAGNOSIS — I25110 Atherosclerotic heart disease of native coronary artery with unstable angina pectoris: Secondary | ICD-10-CM | POA: Diagnosis not present

## 2022-09-07 DIAGNOSIS — Z7984 Long term (current) use of oral hypoglycemic drugs: Secondary | ICD-10-CM

## 2022-09-07 DIAGNOSIS — J45909 Unspecified asthma, uncomplicated: Secondary | ICD-10-CM | POA: Diagnosis not present

## 2022-09-07 DIAGNOSIS — K921 Melena: Secondary | ICD-10-CM | POA: Diagnosis present

## 2022-09-07 DIAGNOSIS — Z833 Family history of diabetes mellitus: Secondary | ICD-10-CM

## 2022-09-07 DIAGNOSIS — I5022 Chronic systolic (congestive) heart failure: Secondary | ICD-10-CM | POA: Diagnosis not present

## 2022-09-07 DIAGNOSIS — M79605 Pain in left leg: Secondary | ICD-10-CM | POA: Diagnosis not present

## 2022-09-07 DIAGNOSIS — I10 Essential (primary) hypertension: Secondary | ICD-10-CM | POA: Diagnosis not present

## 2022-09-07 DIAGNOSIS — I509 Heart failure, unspecified: Secondary | ICD-10-CM | POA: Diagnosis not present

## 2022-09-07 DIAGNOSIS — I2583 Coronary atherosclerosis due to lipid rich plaque: Secondary | ICD-10-CM | POA: Diagnosis not present

## 2022-09-07 DIAGNOSIS — Z7951 Long term (current) use of inhaled steroids: Secondary | ICD-10-CM

## 2022-09-07 DIAGNOSIS — Z91199 Patient's noncompliance with other medical treatment and regimen due to unspecified reason: Secondary | ICD-10-CM

## 2022-09-07 DIAGNOSIS — E782 Mixed hyperlipidemia: Secondary | ICD-10-CM | POA: Diagnosis not present

## 2022-09-07 DIAGNOSIS — Z955 Presence of coronary angioplasty implant and graft: Secondary | ICD-10-CM | POA: Diagnosis not present

## 2022-09-07 DIAGNOSIS — Z7982 Long term (current) use of aspirin: Secondary | ICD-10-CM

## 2022-09-07 DIAGNOSIS — R0602 Shortness of breath: Secondary | ICD-10-CM | POA: Diagnosis not present

## 2022-09-07 LAB — URINALYSIS, ROUTINE W REFLEX MICROSCOPIC
Bilirubin Urine: NEGATIVE
Glucose, UA: >=500 mg/dL — AB
Ketones, ur: NEGATIVE mg/dL
Nitrite: NEGATIVE
Protein, ur: NEGATIVE mg/dL
Specific Gravity, Urine: >1.046 — ABNORMAL HIGH (ref 1.005–1.030)
WBC, UA: >50 WBC/hpf (ref 0–5)
pH: 5 (ref 5.0–8.0)

## 2022-09-07 LAB — CBC
HCT: 41.8 % (ref 39.0–52.0)
HCT: 39.6 % (ref 39.0–52.0)
HCT: 37.7 % — ABNORMAL LOW (ref 39.0–52.0)
Hemoglobin: 13.4 g/dL (ref 13.0–17.0)
Hemoglobin: 13.1 g/dL (ref 13.0–17.0)
Hemoglobin: 12.4 g/dL — ABNORMAL LOW (ref 13.0–17.0)
MCH: 26.9 pg (ref 26.0–34.0)
MCH: 27.8 pg (ref 26.0–34.0)
MCH: 27.7 pg (ref 26.0–34.0)
MCHC: 32.1 g/dL (ref 30.0–36.0)
MCHC: 33.1 g/dL (ref 30.0–36.0)
MCHC: 32.9 g/dL (ref 30.0–36.0)
MCV: 83.8 fL (ref 80.0–100.0)
MCV: 84.1 fL (ref 80.0–100.0)
MCV: 84.3 fL (ref 80.0–100.0)
Platelets: 223 10*3/uL (ref 150–400)
Platelets: 231 10*3/uL (ref 150–400)
Platelets: 220 10*3/uL (ref 150–400)
RBC: 4.99 MIL/uL (ref 4.22–5.81)
RBC: 4.71 MIL/uL (ref 4.22–5.81)
RBC: 4.47 MIL/uL (ref 4.22–5.81)
RDW: 13.9 % (ref 11.5–15.5)
RDW: 14 % (ref 11.5–15.5)
RDW: 14.1 % (ref 11.5–15.5)
WBC: 9.4 10*3/uL (ref 4.0–10.5)
WBC: 9.9 10*3/uL (ref 4.0–10.5)
WBC: 8.7 10*3/uL (ref 4.0–10.5)
nRBC: 0 % (ref 0.0–0.2)
nRBC: 0 % (ref 0.0–0.2)
nRBC: 0 % (ref 0.0–0.2)

## 2022-09-07 LAB — URINE DRUG SCREEN, QUALITATIVE (ARMC ONLY)
Amphetamines, Ur Screen: NOT DETECTED
Barbiturates, Ur Screen: NOT DETECTED
Benzodiazepine, Ur Scrn: NOT DETECTED
Cannabinoid 50 Ng, Ur ~~LOC~~: NOT DETECTED
Cocaine Metabolite,Ur ~~LOC~~: NOT DETECTED
MDMA (Ecstasy)Ur Screen: NOT DETECTED
Methadone Scn, Ur: NOT DETECTED
Opiate, Ur Screen: POSITIVE — AB
Phencyclidine (PCP) Ur S: NOT DETECTED

## 2022-09-07 LAB — LIPASE, BLOOD: Lipase: 22 U/L (ref 11–51)

## 2022-09-07 LAB — BASIC METABOLIC PANEL
Anion gap: 12 (ref 5–15)
BUN: 19 mg/dL (ref 6–20)
CO2: 23 mmol/L (ref 22–32)
Calcium: 8.5 mg/dL — ABNORMAL LOW (ref 8.9–10.3)
Chloride: 99 mmol/L (ref 98–111)
Creatinine, Ser: 1.27 mg/dL — ABNORMAL HIGH (ref 0.61–1.24)
GFR, Estimated: >60 mL/min (ref >60)
Glucose, Bld: 308 mg/dL — ABNORMAL HIGH (ref 70–99)
Potassium: 3.6 mmol/L (ref 3.5–5.1)
Sodium: 134 mmol/L — ABNORMAL LOW (ref 135–145)

## 2022-09-07 LAB — TROPONIN I (HIGH SENSITIVITY)
Troponin I (High Sensitivity): 6 ng/L (ref <18)
Troponin I (High Sensitivity): 5 ng/L (ref <18)
Troponin I (High Sensitivity): 5 ng/L (ref <18)
Troponin I (High Sensitivity): 4 ng/L (ref <18)
Troponin I (High Sensitivity): 5 ng/L (ref <18)

## 2022-09-07 LAB — TYPE AND SCREEN
ABO/RH(D): A POS
Antibody Screen: NEGATIVE

## 2022-09-07 LAB — HEPATIC FUNCTION PANEL
ALT: 18 U/L (ref 0–44)
AST: 21 U/L (ref 15–41)
Albumin: 3.1 g/dL — ABNORMAL LOW (ref 3.5–5.0)
Alkaline Phosphatase: 125 U/L (ref 38–126)
Bilirubin, Direct: 0.1 mg/dL (ref 0.0–0.2)
Indirect Bilirubin: 0.6 mg/dL (ref 0.3–0.9)
Total Bilirubin: 0.7 mg/dL (ref 0.3–1.2)
Total Protein: 7.3 g/dL (ref 6.5–8.1)

## 2022-09-07 LAB — APTT: aPTT: 27 seconds (ref 24–36)

## 2022-09-07 LAB — HEMOGLOBIN A1C
Hgb A1c MFr Bld: 11.7 % — ABNORMAL HIGH (ref 4.8–5.6)
Mean Plasma Glucose: 289.09 mg/dL

## 2022-09-07 LAB — CK: Total CK: 38 U/L — ABNORMAL LOW (ref 49–397)

## 2022-09-07 LAB — HIV ANTIBODY (ROUTINE TESTING W REFLEX): HIV Screen 4th Generation wRfx: NONREACTIVE

## 2022-09-07 LAB — PROTIME-INR
INR: 1.1 (ref 0.8–1.2)
Prothrombin Time: 14.4 seconds (ref 11.4–15.2)

## 2022-09-07 LAB — CBG MONITORING, ED
Glucose-Capillary: 246 mg/dL — ABNORMAL HIGH (ref 70–99)
Glucose-Capillary: 272 mg/dL — ABNORMAL HIGH (ref 70–99)

## 2022-09-07 LAB — BRAIN NATRIURETIC PEPTIDE: B Natriuretic Peptide: 39.4 pg/mL (ref 0.0–100.0)

## 2022-09-07 MED ORDER — SODIUM CHLORIDE 0.9 % IV SOLN: 250.0000 mL | INTRAVENOUS | Status: DC | PRN

## 2022-09-07 MED ORDER — TICAGRELOR 90 MG PO TABS: 90.0000 mg | ORAL_TABLET | Freq: Two times a day (BID) | ORAL | Status: DC

## 2022-09-07 MED ORDER — INSULIN ASPART 100 UNIT/ML IJ SOLN
0.0000 [IU] | Freq: Three times a day (TID) | INTRAMUSCULAR | Status: DC
  Administered 2022-09-07: 3 [IU] via SUBCUTANEOUS
  Administered 2022-09-08: 5 [IU] via SUBCUTANEOUS
  Administered 2022-09-08: 2 [IU] via SUBCUTANEOUS
  Administered 2022-09-09: 5 [IU] via SUBCUTANEOUS
  Administered 2022-09-09 (×2): 3 [IU] via SUBCUTANEOUS
  Administered 2022-09-10: 2 [IU] via SUBCUTANEOUS
  Administered 2022-09-10 (×2): 5 [IU] via SUBCUTANEOUS
  Administered 2022-09-11: 3 [IU] via SUBCUTANEOUS
  Filled 2022-09-07 (×10): qty 1

## 2022-09-07 MED ORDER — ACETAMINOPHEN 325 MG PO TABS
650.0000 mg | ORAL_TABLET | Freq: Four times a day (QID) | ORAL | Status: DC | PRN
  Administered 2022-09-07: 650 mg via ORAL
  Filled 2022-09-07: qty 2

## 2022-09-07 MED ORDER — CYCLOBENZAPRINE HCL 10 MG PO TABS: 10.0000 mg | ORAL_TABLET | Freq: Three times a day (TID) | ORAL | Status: DC | PRN

## 2022-09-07 MED ORDER — ASPIRIN 81 MG PO CHEW
324.0000 mg | CHEWABLE_TABLET | Freq: Once | ORAL | Status: AC
  Administered 2022-09-07: 324 mg via ORAL
  Filled 2022-09-07: qty 4

## 2022-09-07 MED ORDER — IOHEXOL 350 MG/ML SOLN
100.0000 mL | Freq: Once | INTRAVENOUS | Status: AC | PRN
  Administered 2022-09-07: 100 mL via INTRAVENOUS

## 2022-09-07 MED ORDER — HEPARIN (PORCINE) 25000 UT/250ML-% IV SOLN
1350.0000 [IU]/h | INTRAVENOUS | Status: DC
  Filled 2022-09-07: qty 250

## 2022-09-07 MED ORDER — NITROGLYCERIN 0.4 MG SL SUBL: 0.4000 mg | SUBLINGUAL_TABLET | SUBLINGUAL | Status: DC | PRN

## 2022-09-07 MED ORDER — OXYCODONE-ACETAMINOPHEN 5-325 MG PO TABS: 1.0000 | ORAL_TABLET | ORAL | Status: DC | PRN

## 2022-09-07 MED ORDER — PANTOPRAZOLE SODIUM 40 MG PO TBEC
40.0000 mg | DELAYED_RELEASE_TABLET | Freq: Two times a day (BID) | ORAL | Status: DC
  Administered 2022-09-07 – 2022-09-11 (×8): 40 mg via ORAL
  Filled 2022-09-07 (×8): qty 1

## 2022-09-07 MED ORDER — TICAGRELOR 90 MG PO TABS
90.0000 mg | ORAL_TABLET | Freq: Two times a day (BID) | ORAL | Status: DC
  Administered 2022-09-07 – 2022-09-09 (×3): 90 mg via ORAL
  Filled 2022-09-07 (×3): qty 1

## 2022-09-07 MED ORDER — DULOXETINE HCL 30 MG PO CPEP
120.0000 mg | ORAL_CAPSULE | Freq: Every day | ORAL | Status: DC
  Administered 2022-09-07 – 2022-09-11 (×4): 120 mg via ORAL
  Filled 2022-09-07 (×3): qty 4
  Filled 2022-09-07: qty 2

## 2022-09-07 MED ORDER — ATORVASTATIN CALCIUM 20 MG PO TABS
40.0000 mg | ORAL_TABLET | Freq: Every day | ORAL | Status: DC
  Administered 2022-09-07 – 2022-09-10 (×4): 40 mg via ORAL
  Filled 2022-09-07 (×4): qty 2

## 2022-09-07 MED ORDER — SODIUM CHLORIDE 0.9% FLUSH: 3.0000 mL | INTRAVENOUS | Status: DC | PRN

## 2022-09-07 MED ORDER — ISOSORBIDE MONONITRATE ER 30 MG PO TB24
30.0000 mg | ORAL_TABLET | Freq: Every day | ORAL | Status: DC
  Administered 2022-09-07 – 2022-09-10 (×3): 30 mg via ORAL
  Filled 2022-09-07 (×3): qty 1

## 2022-09-07 MED ORDER — ONDANSETRON HCL 4 MG/2ML IJ SOLN
4.0000 mg | Freq: Once | INTRAMUSCULAR | Status: AC
  Administered 2022-09-07: 4 mg via INTRAVENOUS
  Filled 2022-09-07: qty 2

## 2022-09-07 MED ORDER — SODIUM CHLORIDE 0.9 % WEIGHT BASED INFUSION
1.0000 mL/kg/h | INTRAVENOUS | Status: DC
  Administered 2022-09-08: 1 mL/kg/h via INTRAVENOUS

## 2022-09-07 MED ORDER — HEPARIN BOLUS VIA INFUSION
4000.0000 [IU] | Freq: Once | INTRAVENOUS | Status: DC
  Filled 2022-09-07: qty 4000

## 2022-09-07 MED ORDER — SODIUM CHLORIDE 0.9% FLUSH
3.0000 mL | Freq: Two times a day (BID) | INTRAVENOUS | Status: DC
  Administered 2022-09-07: 3 mL via INTRAVENOUS

## 2022-09-07 MED ORDER — MOMETASONE FURO-FORMOTEROL FUM 200-5 MCG/ACT IN AERO: 2.0000 | INHALATION_SPRAY | Freq: Two times a day (BID) | RESPIRATORY_TRACT | Status: DC

## 2022-09-07 MED ORDER — DM-GUAIFENESIN ER 30-600 MG PO TB12: 1.0000 | ORAL_TABLET | Freq: Two times a day (BID) | ORAL | Status: DC | PRN

## 2022-09-07 MED ORDER — ASPIRIN 81 MG PO TBEC
81.0000 mg | DELAYED_RELEASE_TABLET | Freq: Every day | ORAL | Status: DC
  Administered 2022-09-09 – 2022-09-11 (×3): 81 mg via ORAL
  Filled 2022-09-07 (×3): qty 1

## 2022-09-07 MED ORDER — ASPIRIN 81 MG PO CHEW
81.0000 mg | CHEWABLE_TABLET | ORAL | Status: AC
  Administered 2022-09-08: 81 mg via ORAL
  Filled 2022-09-07: qty 1

## 2022-09-07 MED ORDER — METOPROLOL TARTRATE 50 MG PO TABS
100.0000 mg | ORAL_TABLET | Freq: Two times a day (BID) | ORAL | Status: DC
  Administered 2022-09-08 – 2022-09-11 (×6): 100 mg via ORAL
  Filled 2022-09-07 (×6): qty 2

## 2022-09-07 MED ORDER — ONDANSETRON HCL 4 MG/2ML IJ SOLN: 4.0000 mg | Freq: Three times a day (TID) | INTRAMUSCULAR | Status: DC | PRN

## 2022-09-07 MED ORDER — HYDRALAZINE HCL 20 MG/ML IJ SOLN: 5.0000 mg | INTRAMUSCULAR | Status: DC | PRN

## 2022-09-07 MED ORDER — HYDROMORPHONE HCL 1 MG/ML IJ SOLN
0.5000 mg | Freq: Once | INTRAMUSCULAR | Status: AC
  Administered 2022-09-07: 0.5 mg via INTRAVENOUS
  Filled 2022-09-07: qty 0.5

## 2022-09-07 MED ORDER — TRAZODONE HCL 50 MG PO TABS
50.0000 mg | ORAL_TABLET | Freq: Every evening | ORAL | Status: DC | PRN
  Administered 2022-09-09: 50 mg via ORAL
  Filled 2022-09-07: qty 2

## 2022-09-07 MED ORDER — ALBUTEROL SULFATE (2.5 MG/3ML) 0.083% IN NEBU: 2.5000 mg | INHALATION_SOLUTION | RESPIRATORY_TRACT | Status: DC | PRN

## 2022-09-07 MED ORDER — HYDROMORPHONE HCL 1 MG/ML IJ SOLN: 1.0000 mg | INTRAMUSCULAR | Status: DC | PRN

## 2022-09-07 MED ORDER — MORPHINE SULFATE (PF) 4 MG/ML IV SOLN
4.0000 mg | Freq: Once | INTRAVENOUS | Status: AC
  Administered 2022-09-07: 4 mg via INTRAVENOUS
  Filled 2022-09-07: qty 1

## 2022-09-07 MED ORDER — SODIUM CHLORIDE 0.9 % WEIGHT BASED INFUSION
3.0000 mL/kg/h | INTRAVENOUS | Status: DC
  Administered 2022-09-08: 3 mL/kg/h via INTRAVENOUS

## 2022-09-07 MED ORDER — INSULIN ASPART 100 UNIT/ML IJ SOLN
0.0000 [IU] | Freq: Every day | INTRAMUSCULAR | Status: DC
  Administered 2022-09-07 – 2022-09-09 (×2): 3 [IU] via SUBCUTANEOUS
  Administered 2022-09-09: 2 [IU] via SUBCUTANEOUS
  Administered 2022-09-10: 3 [IU] via SUBCUTANEOUS
  Filled 2022-09-07 (×4): qty 1

## 2022-09-07 NOTE — H&P (View-Only) (Signed)
 Cardiology Consultation:   Patient ID: Brett Marshall; [id number]; 02/06/1966   Admit date: 09/07/2022 Date of Consult: 09/07/2022  Primary Care Provider: Smith, Brett M, MD Primary Cardiologist: Arida Primary Electrophysiologist:  None   Patient Profile:   Brett Marshall is a 57 y.o. 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  History of Present Illness:   Brett Marshall previously underwent LHC on 08/2017 which nonobstructive CAD with an EF 45 to 50%.  He had mildly elevated pulmonary filling pressures and has since been treated for sleep apnea.  In 08/2021, he was admitted to the hospital with a 10-day history of constant chest pain that was somewhat reproducible with palpation but also worse with exertion.  High-sensitivity troponins were normal.  Stress testing showed anterior and apical ischemia.  Diagnostic LHC showed 95% stenosis in the ostial LAD, and otherwise nonobstructive disease.  The LAD was treated successfully PCI/DES with improvement in exertional symptoms.  Cath was performed via right ulnar approach and complicated by swelling and pain of the right forearm with paresthesias of the fingers on the right hand post procedure.  Ultrasound showed a hematoma within the superficial soft tissues of the right upper extremity, however no evidence of pseudoaneurysm or AV fistula was noted.  He returned to the ED 2 days later with ongoing right forearm swelling and ecchymosis.  Repeat ultrasound again showed soft tissue hematoma measuring up to 5.1 cm and was largely unchanged.  He was seen in follow-up in 09/2021 with recurrent constant low-level chest tightness that worsened with exertion similar to what he experienced prior to stenting.  Outpatient labs were notable for  serum creatinine 1.41, mild leukocytosis with a WBC 13.0 and an elevated D-dimer.  In this setting, he was referred to the ED for hydration and CTA, which was negative for PE.  He was admitted a day later with refractory angina and underwent diagnostic LHC which showed patent LAD stent with otherwise nonobstructive disease.  It was noted he had an LVOT gradient of 50 to 80 mmHg at rest 110 mmHg provocation.  He was seen again in the ED later in 09/2021 with chest pain with unremarkable workup.  He subsequently underwent cardiac MRI in late 09/2021 which showed normal LV systolic function without evidence for hypertrophic cardiomyopathy or LVOT gradient.  In follow-up, he has continued to note persistent dyspnea with minimal exertion.  He was seen in the ED in 03/2022 with chest pain with unrevealing workup.  He was most recently seen in the ED in 06/2022 with chest pain with unrevealing workup.   He presented to [facility] on 09/07/2022 with a 1 week history of exertional fatigue and dyspnea occurring with ambulation of up to 10 to 20 feet along with intermittent bandlike chest discomfort that has been rated 5 out of 10 for the most part.  However, last evening this pain radiated up to his left shoulder and was 8 out of 10 persisting until the early morning hours this morning.  When he woke up, he continued to note this 8 out of 10 chest pain (pain did not wake the patient up).  Symptoms of pain and exertional fatigue/dyspnea feels similar to what he was experiencing leading up to his PCI last year.  He   also notes some left lower quadrant abdominal discomfort and pain/hyperpigmentation along the bilateral lower extremities along the medial portion above the ankles.  Lastly, he notes some intermittent BRBPR since undergoing colonoscopy 4 years ago with stool this morning that was considerably darker than what the typically has.  No BRBPR with stool this morning.  He did miss approximately 3 days of ticagrelor 1.5 weeks ago  as the pharmacy did not have it.  Given symptoms of chest pain and exertional dyspnea he presented to [facility] ED.  Upon arrival, he was hemodynamically stable with oxygen saturation 100% on room air.  Afebrile.  Negative high-sensitivity troponin x 3.  Chest x-ray without active cardiopulmonary disease.  Lower extremity venous duplex negative for DVT bilaterally.  CTA chest negative for PE with no acute or inflammatory process identified.  CT abdomen/pelvis showed no acute process with chronic thoracolumbar spinal ankylosis and diffuse idiopathic skeletal hyperostosis.  In the ED, he received ASA 324 mg x 1, 0.5 mg Dilaudid, 4 mg morphine, 4 mg of Zofran.  He continues to note to 7 out of 10 substernal chest pressure radiating to the left shoulder.    Past Medical History:  Diagnosis Date   Asthma    CAD (coronary artery disease)    a. 08/2017 Cath: Diff nonobs dzs, EF 45-50%, diff HK; b. 08/2021 MV: Ant/apical ishemia; c. 08/2021 PCI: LAD 95ost (4.0x15 Onyx Frontier DES), EF 55-65%; c. 09/2021 Cath: LM nl, LAD patent stent, 20m, 30d, RI min irregs, LCX min irregs, RCA 50p. LVOT grad 50-80mmHg (rest), 70-110mmHg (provoked).   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 08/2017 LV gram: EF 45-50%, glob HK; b. 08/2021 Echo: EF 60-65%, no rwma, GrI DD, nl RV fxn; c. 09/2021 cMRI: EF 60%, no LVOT obstruction/gradient. Mild conc LVH, mild AS. No myocardial scar/fibrosis. No evidence of HCM.   Hyperlipidemia    Leg swelling    Left   Low back pain    Mixed ICM & NICM    a. 08/2017 LV gram: EF 45-50%, glob HK; b. 08/2021 Echo: EF 60-65%.   Morbid obesity    OSA on CPAP    non compliant   Pulmonary hypertension     Past Surgical History:  Procedure Laterality Date   CARDIAC CATHETERIZATION     [place] no stents.   COLONOSCOPY WITH  PROPOFOL N/A 04/11/2019   Procedure: COLONOSCOPY WITH PROPOFOL;  Surgeon: Marshall, Darren, MD;  Location: [facility] ENDOSCOPY;  Service: Endoscopy;  Laterality: N/A;   CORONARY STENT INTERVENTION N/A 09/08/2021   Procedure: CORONARY STENT INTERVENTION;  Surgeon: Arida, Muhammad A, MD;  Location: [facility] INVASIVE CV LAB;  Service: Cardiovascular;  Laterality: N/A;   CORONARY ULTRASOUND/IVUS N/A 09/08/2021   Procedure: Intravascular Ultrasound/IVUS;  Surgeon: Arida, Muhammad A, MD;  Location: [facility] INVASIVE CV LAB;  Service: Cardiovascular;  Laterality: N/A;   LEFT HEART CATH AND CORONARY ANGIOGRAPHY N/A 09/08/2021   Procedure: LEFT HEART CATH AND CORONARY ANGIOGRAPHY;  Surgeon: Arida, Muhammad A, MD;  Location: [facility] INVASIVE CV LAB;  Service: Cardiovascular;  Laterality: N/A;   LEFT HEART CATH AND CORONARY ANGIOGRAPHY N/A 09/17/2021   Procedure: LEFT HEART CATH AND CORONARY ANGIOGRAPHY;  Surgeon: End, Christopher, MD;  Location: [facility] INVASIVE CV LAB;  Service: Cardiovascular;  Laterality: N/A;   RIGHT/LEFT HEART CATH AND   CORONARY ANGIOGRAPHY N/A 09/02/2017   Procedure: RIGHT/LEFT HEART CATH AND CORONARY ANGIOGRAPHY;  Surgeon: Arida, Muhammad A, MD;  Location: [facility] INVASIVE CV LAB;  Service: Cardiovascular;  Laterality: N/A;     Home Meds: Prior to Admission medications   Medication Sig Start Date End Date Taking? Authorizing Provider  albuterol (VENTOLIN HFA) 108 (90 Base) MCG/ACT inhaler Inhale 1-2 puffs into the lungs every 6 (six) hours as needed for wheezing or shortness of breath. 09/11/20  Yes Walsh, Elizabeth W, NP  aspirin EC 81 MG tablet Take 1 tablet (81 mg total) by mouth daily. Swallow whole. 12/02/21  Yes Berge, Christopher Ronald, NP  atorvastatin (LIPITOR) 40 MG tablet Take 1 tablet (40 mg total) by mouth daily at 6 PM. 09/09/21  Yes Amin, Ankit Chirag, MD  BRILINTA 90 MG TABS tablet Take 1 tablet (90 mg total) by mouth 2 (two) times daily. 08/13/22  Yes Arida, Muhammad A, MD  DULoxetine (CYMBALTA) 60  MG capsule Take 120 mg by mouth daily. 08/10/22  Yes [provider]  fluticasone-salmeterol (ADVAIR) 250-50 MCG/ACT AEPB Inhale 1 puff into the lungs every 12 (twelve) hours. 01/29/22  Yes Walsh, Elizabeth W, NP  furosemide (LASIX) 20 MG tablet Take 1 tablet (20 mg total) by mouth daily as needed (for SOB or weight gain of 3 lbs overnight). 07/14/21  Yes Arida, Muhammad A, MD  gabapentin (NEURONTIN) 300 MG capsule Take 1 capsule by mouth at bedtime. 09/04/22 09/04/23 Yes [provider]  glipiZIDE (GLUCOTROL) 5 MG tablet Take 1 tablet (5 mg total) by mouth 2 (two) times daily before a meal. 12/09/17  Yes Marshall, Brett M, MD  JARDIANCE 25 MG TABS tablet Take 25 mg by mouth daily. 06/06/20  Yes [provider]  metFORMIN (GLUCOPHAGE) 1000 MG tablet Take 1 tablet (1,000 mg total) by mouth 2 (two) times daily with a meal. 12/09/17  Yes Marshall, Brett M, MD  metoprolol tartrate (LOPRESSOR) 100 MG tablet Take 1 tablet (100 mg total) by mouth 2 (two) times daily. 08/10/22  Yes End, Christopher, MD  cyclobenzaprine (FLEXERIL) 10 MG tablet Take 1 tablet (10 mg total) by mouth 3 (three) times daily as needed.For spasms 11/20/17   Marshall, Brett M, MD  DULoxetine (CYMBALTA) 30 MG capsule Take 2 capsules (60 mg total) by mouth daily. Patient not taking: Reported on 09/07/2022 09/09/21   Amin, Ankit Chirag, MD  famotidine (PEPCID) 40 MG tablet Take 40 mg by mouth daily.  08/14/18   [provider]  glucose blood (GE100 BLOOD GLUCOSE TEST) test strip Use as instructed 02/03/16   Marshall, Brett M, MD  Lancets (ONETOUCH ULTRASOFT) lancets Check sugar twice daily 01/11/16   Marshall, Brett M, MD  traMADol (ULTRAM) 50 MG tablet Take 1 tablet (50 mg total) by mouth every 6 (six) hours as needed. 09/29/21 09/29/22  Kinner, Robert, MD  traZODone (DESYREL) 50 MG tablet Take 50-100 mg by mouth at bedtime. 08/19/21   [provider]    Inpatient Medications: Scheduled Meds:  [START ON 09/08/2022]  aspirin EC  81 mg Oral Daily   atorvastatin  40 mg Oral q1800   DULoxetine  120 mg Oral Daily   insulin aspart  0-5 Units Subcutaneous QHS   insulin aspart  0-9 Units Subcutaneous TID WC   metoprolol tartrate  100 mg Oral BID   [START ON 09/08/2022] mometasone-formoterol  2 puff Inhalation BID   pantoprazole  40 mg Oral BID   Continuous Infusions:  PRN Meds: acetaminophen, albuterol, cyclobenzaprine,   dextromethorphan-guaiFENesin, hydrALAZINE, HYDROmorphone (DILAUDID) injection, nitroGLYCERIN, ondansetron (ZOFRAN) IV, oxyCODONE-acetaminophen, traZODone  Allergies:  No Known Allergies  Social History:   Social History   Socioeconomic History   Marital status: Married    Spouse name: Not on file   Number of children: Not on file   Years of education: Not on file   Highest education level: Not on file  Occupational History   Occupation: computer programmer    Employer: [employer]  Tobacco Use   Smoking status: Never   Smokeless tobacco: Never  Vaping Use   Vaping Use: Never used  Substance and Sexual Activity   Alcohol use: Yes    Comment: few times per year/ socially   Drug use: No   Sexual activity: Yes  Other Topics Concern   Not on file  Social History Narrative   Goes to gym.   Social Determinants of Health   Financial Resource Strain: Not on file  Food Insecurity: No Food Insecurity (09/07/2022)   Hunger Vital Sign    Worried About Running Out of Food in the Last Year: Never true    Ran Out of Food in the Last Year: Never true  Transportation Needs: No Transportation Needs (09/07/2022)   PRAPARE - Transportation    Lack of Transportation (Medical): No    Lack of Transportation (Non-Medical): No  Physical Activity: Not on file  Stress: Not on file  Social Connections: Not on file  Intimate Partner Violence: Not At Risk (09/07/2022)   Humiliation, Afraid, Rape, and Kick questionnaire    Fear of Current or Ex-Partner: No    Emotionally Abused: No    Physically  Abused: No    Sexually Abused: No     Family History:   Family History  Problem Relation Age of Onset   Diabetes Mother    Asthma Mother    Heart disease Father    Heart attack Father    Sjogren's syndrome Sister    Heart disease Brother 68       CAD/CABG    ROS:  Review of Systems  Constitutional:  Positive for malaise/fatigue. Negative for chills, diaphoresis, fever and weight loss.  HENT:  Negative for congestion.   Eyes:  Negative for discharge and redness.  Respiratory:  Positive for shortness of breath. Negative for cough, sputum production and wheezing.   Cardiovascular:  Positive for chest pain and leg swelling. Negative for palpitations, orthopnea, claudication and PND.  Gastrointestinal:  Positive for melena. Negative for abdominal pain, blood in stool, heartburn, nausea and vomiting.  Musculoskeletal:  Negative for falls and myalgias.       Bilateral medial lower extremity discomfort   Skin:  Negative for rash.  Neurological:  Negative for dizziness, tingling, tremors, sensory change, speech change, focal weakness, loss of consciousness and weakness.  Endo/Heme/Allergies:  Does not bruise/bleed easily.  Psychiatric/Behavioral:  Negative for substance abuse. The patient is not nervous/anxious.   All other systems reviewed and are negative.     Physical Exam/Data:   Vitals:   09/07/22 1200 09/07/22 1230 09/07/22 1300 09/07/22 1400  BP: 116/75 115/66 101/71 107/70  Pulse: 88 90  92  Resp: 16 (!) 26  14  Temp:      TempSrc:      SpO2: 97% 94%  100%   No intake or output data in the 24 hours ending 09/07/22 1431 There were no vitals filed for this visit. There is no height or weight on file to calculate BMI.   Physical Exam:   General: Well developed, well nourished, in no acute distress. Head: Normocephalic, atraumatic, sclera non-icteric, no xanthomas, nares without discharge.  Neck: Negative for carotid bruits. JVD not elevated. Lungs: Clear bilaterally to  auscultation without wheezes, rales, or rhonchi. Breathing is unlabored. Heart: RRR with S1 S2. II/VI systolic murmur RUSB, no rubs, or gallops appreciated. Abdomen: Soft, non-tender, non-distended with normoactive bowel sounds. No hepatomegaly. No rebound/guarding. No obvious abdominal masses. Msk:  Strength and tone appear normal for age. Extremities: No clubbing or cyanosis. No edema. Distal pedal pulses are 2+ and equal bilaterally. Neuro: Alert and oriented X 3. No facial asymmetry. No focal deficit. Moves all extremities spontaneously. Psych:  Responds to questions appropriately with a normal affect.   EKG:  The EKG was personally reviewed and demonstrates: 09/07/2022, 08:07 NSR, 90 bpm, low voltage QRS, no acute ST-T changes.  09/07/2022, 10:39 NSR, 89 bpm, low voltage, no acute ST-T changes Telemetry:  Telemetry was personally reviewed and demonstrates: Sinus rhythm  Weights: There were no vitals filed for this visit.  Relevant CV Studies:  R/LHC 09/02/2017: Ost LAD lesion is 30% stenosed. Mid LAD lesion is 20% stenosed. Dist LAD lesion is 30% stenosed. Prox RCA lesion is 40% stenosed. The left ventricular systolic function is normal. LV end diastolic pressure is normal. The left ventricular ejection fraction is 45-50% by visual estimate.   1.  Mild nonobstructive coronary artery disease. 2.  Mildly reduced LV systolic function with an EF of 45-50% with global hypokinesis. 3.  Right heart catheterization showed moderately elevated filling pressures with a pulmonary wedge pressure of 22 mmHg, mild pulmonary hypertension and high cardiac output.   Recommendations: The patient seems to have mild nonischemic cardiomyopathy with evidence of volume overload.  I am switching hydrochlorothiazide to furosemide and adding carvedilol.  The patient needs to get his sleep apnea treated __________  2D echo 01/19/2020: 1. Left ventricular ejection fraction, by estimation, is 60 to 65%. The   left ventricle has normal function. The left ventricle has no regional  wall motion abnormalities. Left ventricular diastolic parameters are  consistent with Grade I diastolic  dysfunction (impaired relaxation).   2. Right ventricular systolic function is normal. The right ventricular  size is normal. Tricuspid regurgitation signal is inadequate for assessing  PA pressure.  __________  2D echo 09/05/2021: 1. Left ventricular ejection fraction, by estimation, is 60 to 65%. The  left ventricle has normal function. The left ventricle has no regional  wall motion abnormalities. Left ventricular diastolic parameters are  consistent with Grade I diastolic  dysfunction (impaired relaxation).   2. Right ventricular systolic function is normal. The right ventricular  size is normal.   3. The mitral valve is normal in structure. No evidence of mitral valve  regurgitation. No evidence of mitral stenosis.   4. The aortic valve is normal in structure. Aortic valve regurgitation is  not visualized. Aortic valve sclerosis is present, with no evidence of  aortic valve stenosis.   5. The inferior vena cava is normal in size with greater than 50%  respiratory variability, suggesting right atrial pressure of 3 mmHg.   6. Trivial pericardial effusion is present.  __________  Lexiscan MPI 09/05/2021:   Findings are equivocal. The study is intermediate risk.   No ST deviation was noted.   LV perfusion is abnormal. Defect 1: There is a medium defect with moderate reduction in uptake present in the apical to mid anterior and anterolateral location(s) that is partially reversible. There is normal wall   motion in the defect area.   Left ventricular function is normal. End diastolic cavity size is normal. End systolic cavity size is normal.   Prior study not available for comparison.   Body habitus, extracardiac activity affect both stress and rest images, but they appear to be worse on stress images. This was  a 2 day study. There is a perfusion defect seen in the mid anterior to apical segments at rest. On stress imaging, there is a more pronounced perfusion defect in the mid to apical anterior and anterolateral wall. There is normal wall motion in this area. Overall cannot exclude ischemia; given image quality, cannot determine if ischemia vs. Artifact on stress imaging. __________  LHC 09/08/2021:   Mid LAD lesion is 20% stenosed.   Dist LAD lesion is 30% stenosed.   Prox RCA lesion is 50% stenosed.   Ost LAD lesion is 95% stenosed.   A drug-eluting stent was successfully placed using a STENT ONYX FRONTIER 4.0X15.   Post intervention, there is a 0% residual stenosis.   The left ventricular systolic function is normal.   LV end diastolic pressure is mildly elevated.   The left ventricular ejection fraction is 55-65% by visual estimate.   1.  Severe one-vessel coronary artery disease with 95% ostial LAD stenosis likely due to plaque rupture given that this area was only 30% on previous cardiac catheterization in 2019.  Moderate proximal RCA disease does not seem to be different from before. 2.  Normal LV systolic function and mildly elevated left ventricular end-diastolic pressure. 3.  Moderate systolic LVOT gradient of 25 to 30 mmHg 4.  Successful IVUS guided PCI and drug-eluting stent placement to the ostial LAD. 5.  The procedure was done via the right ulnar artery given that the radial artery was very small in size.   Recommendations: Dual antiplatelet therapy for at least 12 months. Aggressive treatment of risk factors. __________  LHC 09/17/2021: Conclusions: Mild-moderate, nonobstructive coronary artery disease similar to the conclusion of last weeks catheterization. Widely patent ostial/proximal LAD stent. Normal left ventricular filling pressure (LVEDP 10-15 mmHg). Significant left ventricular outflow gradient of up to 80 mmHg at rest and 110 mmHg with provocation (post PVC).  Findings  are suspicious for hypertrophic cardiomyopathy.   Recommendations: Continue secondary prevention of coronary artery disease including 12 months of dual antiplatelet therapy with aspirin and ticagrelor from recent PCI to the LAD. Gentle post catheterization hydration with escalation of beta-blockade to help improve LVOT gradient.  Worsening symptoms may be due to a degree of intravascular volume depletion and tachycardia. Consider cardiac MRI for further evaluation of possible hypertrophic cardiomyopathy. __________  Cardiac MRI 10/15/2021: IMPRESSION: 1.  Normal LV size and function, LVEF= 60%. 2.  No LVOT obstruction or gradient noted. 3.  Mild concentric LV wall thickness. 4.  Mild aortic valve stenosis. 5.  No evidence for myocardial fibrosis or scar. 6.  No evidence for HCM.    Laboratory Data:  Chemistry Recent Labs  Lab 09/07/22 0827  NA 134*  K 3.6  CL 99  CO2 23  GLUCOSE 308*  BUN 19  CREATININE 1.27*  CALCIUM 8.5*  GFRNONAA >60  ANIONGAP 12    Recent Labs  Lab 09/07/22 0827  PROT 7.3  ALBUMIN 3.1*  AST 21  ALT 18  ALKPHOS 125  BILITOT 0.7   Hematology Recent Labs  Lab 09/07/22 0827  WBC 9.4  RBC 4.99  HGB 13.4  HCT 41.8  MCV 83.8  MCH 26.9    MCHC 32.1  RDW 13.9  PLT 223   Cardiac EnzymesNo results for input(s): "TROPONINI" in the last 168 hours. No results for input(s): "TROPIPOC" in the last 168 hours.  BNP Recent Labs  Lab 09/07/22 0827  BNP 39.4    DDimer No results for input(s): "DDIMER" in the last 168 hours.  Radiology/Studies:  CT ABDOMEN PELVIS W CONTRAST  Result Date: 09/07/2022 IMPRESSION: 1. No acute or inflammatory process identified in the abdomen or pelvis. Normal appendix. Diverticulosis of the distal colon without active inflammation. 2. CTA Chest reported separately. 3. Chronic thoracolumbar spinal ankylosis, Diffuse idiopathic skeletal hyperostosis (DISH). Electronically Signed   By: H  Hall M.D.   On: 09/07/2022 10:43    CT Angio Chest PE W and/or Wo Contrast  Result Date: 09/07/2022 IMPRESSION: 1. Mildly degraded by respiratory motion. No pulmonary embolus identified. 2. No acute or inflammatory process identified in the Chest. 3. CT Abdomen and Pelvis reported separately. 4. Thoracic spinal ankylosis, Diffuse idiopathic skeletal hyperostosis (DISH). Electronically Signed   By: H  Hall M.D.   On: 09/07/2022 10:38   US Venous Img Lower Bilateral  Result Date: 09/07/2022 IMPRESSION: No evidence of deep venous thrombosis in either lower extremity. Electronically Signed   By: Mark  Lukens M.D.   On: 09/07/2022 10:01   DG Chest 2 View  Result Date: 09/07/2022 IMPRESSION: No active cardiopulmonary disease. Electronically Signed   By: M.  Shick M.D.   On: 09/07/2022 08:34    Assessment and Plan:   CAD involving native coronary arteries with accelerating angina and exertional dyspnea concerning for anginal equivalent:  -Currently, continues to note 5-7 out of 10 substernal chest pressure that radiates towards the left shoulder -Ruled out  -Continue DAPT with ASA and ticagrelor -Given he has ruled out and in the context of possible dark stools earlier this morning, defer initiation of heparin drip -Patient with known history of chest pain and since undergoing PCI last spring with most recent cath 11 months ago showing patent LAD stent with otherwise nonobstructive disease  -Symptoms similar to what he was experiencing leading up to his PCI last year -Add Imdur 30 mg to achieve chest pain-free -N.p.o. at midnight -After discussion with interventional cardiology, plan for LHC 4/23 if hemoglobin remains stable -If LHC is shows stable anatomy, would consider escalation of medical therapy for antianginal effect   -Continue aggressive risk factor modification and secondary prevention including aspirin, ticagrelor, metoprolol tartrate, and atorvastatin   HTN:  -Blood pressure well-controlled  -Continue metoprolol    HLD:  -LDL 79 in 08/2021 with goal being at least less than 70  -Remains on atorvastatin 40 mg  -LP(a) previously evaluated and not elevated   Morbid obesity with OSA:  -Weight loss encouraged heartily diet and regular exercise  -CPAP adherence recommended   Dark stool:  -Patient reported an episode of dark stool  -Hgb stable and at baseline ED  -Management per primary service  -If workup is unrevealing and hemoglobin remained stable, may need to consider cardiac cath as outlined above   Shared Decision Making/Informed Consent{  The risks [stroke (1 in 1000), death (1 in 1000), kidney failure [usually temporary] (1 in 500), bleeding (1 in 200), allergic reaction [possibly serious] (1 in 200)], benefits (diagnostic support and management of coronary artery disease) and alternatives of a cardiac catheterization were discussed in detail with Mr. Noffke and he is willing to proceed.    For questions or updates, please contact [facility] Please consult [email or web address] for   contact info under Cardiology/STEMI.   Signed, Avi Archuleta, PA-C [facility] Pager: [phone] 09/07/2022, 2:31 PM    

## 2022-09-07 NOTE — ED Notes (Signed)
Pt independently ambulatory to bathroom and back to bed

## 2022-09-07 NOTE — ED Triage Notes (Signed)
Patient to ED with chest pain- intermittent x 1 week. States pain goes across the chest. Also states SOB and lower abd pain. Hx of stent placement last year.

## 2022-09-07 NOTE — ED Provider Notes (Signed)
[facility] Provider Note    Event Date/Time   First MD Initiated Contact with Patient 09/07/22 0815     (approximate)   History   Chest Pain   HPI  Brett Marshall is a 57 y.o. male with coronary artery disease, chronic heart failure with improved EF who comes in with concern for chest pain.  He does report last having a stent placed a year ago.  Patient reports over the past week having increasing chest discomfort.  He reports it is occasionally exertional in nature and associated with some shortness of breath.  He reports that his current pain is more severe this morning.  He states that he had it last night and is unsure if it ever went away but he did go to sleep with it and did wake up with a.  He denies any falls hitting his head, headaches.  He does report some left lower quadrant pain.  He states that these are not related does not feel like pain radiating into his stomach states that it feels more like sharp stabbing pain that started a few days ago.  Denies any history of diverticulitis.  He does report some increasing pain in his lower calfs that he is never had before and a family history of blood clots but denies any himself.  I reviewed the note back in February 2024.  I had negative workup and was told to follow-up with cardiology outpatient.   Physical Exam   Triage Vital Signs: ED Triage Vitals  Enc Vitals Group     BP 09/07/22 0805 (!) 133/98     Pulse Rate 09/07/22 0805 (!) 101     Resp 09/07/22 0805 18     Temp 09/07/22 0805 97.7 F (36.5 C)     Temp Source 09/07/22 0805 Oral     SpO2 09/07/22 0805 100 %     Weight --      Height --      Head Circumference --      Peak Flow --      Pain Score 09/07/22 0806 7     Pain Loc --      Pain Edu? --      Excl. in GC? --     Most recent vital signs: Vitals:   09/07/22 0805  BP: (!) 133/98  Pulse: (!) 101  Resp: 18  Temp: 97.7 F (36.5 C)  SpO2: 100%     General: Awake, no  distress.  CV:  Good peripheral perfusion.  Resp:  Normal effort.  Abd:  No distention.  Slight tenderness left lower quadrant  other:  Good distal pulses radially.  Good distal pulses DP.  No numbness, no tingling.   ED Results / Procedures / Treatments   Labs (all labs ordered are listed, but only abnormal results are displayed) Labs Reviewed  BASIC METABOLIC PANEL  CBC  TROPONIN I (HIGH SENSITIVITY)     EKG  My interpretation of EKG:  Normal sinus rate of 98 without any ST elevation or T wave inversions, normal intervals  Repeat EKG sinus rate of 89 without any ST elevation or T wave inversions, normal intervals  RADIOLOGY I have reviewed the xray personally and interpreted and patient has no pneumonia   PROCEDURES:  Critical Care performed: No  .1-3 Lead EKG Interpretation  Performed by: Concha Se, MD Authorized by: Concha Se, MD     Interpretation: normal     ECG rate:  80   ECG rate assessment: normal     Rhythm: sinus rhythm     Ectopy: none     Conduction: normal   .Critical Care  Performed by: Concha Se, MD Authorized by: Concha Se, MD   Critical care provider statement:    Critical care time (minutes):  30   Critical care was necessary to treat or prevent imminent or life-threatening deterioration of the following conditions:  Cardiac failure   Critical care was time spent personally by me on the following activities:  Development of treatment plan with patient or surrogate, discussions with consultants, evaluation of patient's response to treatment, examination of patient, ordering and review of laboratory studies, ordering and review of radiographic studies, ordering and performing treatments and interventions, pulse oximetry, re-evaluation of patient's condition and review of old charts    MEDICATIONS ORDERED IN ED: Medications  nitroGLYCERIN (NITROSTAT) SL tablet 0.4 mg (has no administration in time range)  HYDROmorphone  (DILAUDID) injection 1 mg (has no administration in time range)  oxyCODONE-acetaminophen (PERCOCET/ROXICET) 5-325 MG per tablet 1 tablet (has no administration in time range)  acetaminophen (TYLENOL) tablet 650 mg (has no administration in time range)  hydrALAZINE (APRESOLINE) injection 5 mg (has no administration in time range)  albuterol (PROVENTIL) (2.5 MG/3ML) 0.083% nebulizer solution 2.5 mg (has no administration in time range)  dextromethorphan-guaiFENesin (MUCINEX DM) 30-600 MG per 12 hr tablet 1 tablet (has no administration in time range)  ondansetron (ZOFRAN) injection 4 mg (has no administration in time range)  insulin aspart (novoLOG) injection 0-9 Units (has no administration in time range)  insulin aspart (novoLOG) injection 0-5 Units (has no administration in time range)  heparin bolus via infusion 4,000 Units (has no administration in time range)  heparin ADULT infusion 100 units/mL (25000 units/244mL) (has no administration in time range)  morphine (PF) 4 MG/ML injection 4 mg (4 mg Intravenous Given 09/07/22 0847)  ondansetron (ZOFRAN) injection 4 mg (4 mg Intravenous Given 09/07/22 0846)  iohexol (OMNIPAQUE) 350 MG/ML injection 100 mL (100 mLs Intravenous Contrast Given 09/07/22 1020)  HYDROmorphone (DILAUDID) injection 0.5 mg (0.5 mg Intravenous Given 09/07/22 1034)  aspirin chewable tablet 324 mg (324 mg Oral Given 09/07/22 1248)     IMPRESSION / MDM / ASSESSMENT AND PLAN / ED COURSE  I reviewed the triage vital signs and the nursing notes.   Patient's presentation is most consistent with acute presentation with potential threat to life or bodily function.   Patient comes in with some chest pain.  He has not followed up with cardiology recently did have a stent put in.  Will get EKG, cardiac markers to further evaluate and will get CT PE given he reports some exertional shortness of breath and family history of blood clots with new leg pain.  Will also get CT abdomen given  left lower quadrant pain.  Does not sound like dissection the pain is not starting at the same time is been ongoing for over a week, reassuring blood pressures good pulses and no numbness or tingling.  I be more worried about a separate process such as diverticulitis.  Patient given some IV morphine IV Zofran out with symptoms.  Patient reported some increasing chest pain so repeat EKG was done that was stable.  Troponins are negative x 2.  BMP shows creatinine that is slightly elevated.  Glucose is slightly elevated but he did not take his medications yet this morning.  CBC reassuring.  Hepatic function normal CK normal  CT abdomen was negative for acute pathology.  Incidental findings given to patient and discharge-CT PE negative.  Ultrasound DVT is negative bilaterally  11:31 AM patient reports having continued chest pain.  Troponins are negative but he states that this is exact pain that he had when he had to be admitted previously he reports exertional chest pain that is now getting worse with more pain at rest.  Will discuss with Dr. Kirke Corin   Discussed with Dr Hulan Fess concern for potential unstable angina will place on heparin, aspirin and consider possible cath given not a great candidate for stress test.   The patient is on the cardiac monitor to evaluate for evidence of arrhythmia and/or significant heart rate changes.      FINAL CLINICAL IMPRESSION(S) / ED DIAGNOSES   Final diagnoses:  Unstable angina     Rx / DC Orders   ED Discharge Orders     None        Note:  This document was prepared using Dragon voice recognition software and may include unintentional dictation errors.   Concha Se, MD 09/07/22 1352

## 2022-09-07 NOTE — ED Notes (Signed)
Patient transported to CT 

## 2022-09-07 NOTE — ED Notes (Signed)
Pt provided with diet ginger ale per request.  

## 2022-09-07 NOTE — Consult Note (Signed)
Cardiology Consultation:   Patient ID: Brett Marshall; [id number]; Mar 27, 1966   Admit date: 09/07/2022 Date of Consult: 09/07/2022  Primary Care Provider: Ethelda Chick, MD Primary Cardiologist: Kirke Corin Primary Electrophysiologist:  None   Patient Profile:   Brett Marshall is a 57 y.o. 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 Clyde Lundborg.  History of Present Illness:   Brett Marshall previously underwent LHC on 08/2017 which nonobstructive CAD with an EF 45 to 50%.  He had mildly elevated pulmonary filling pressures and has since been treated for sleep apnea.  In 08/2021, he was admitted to the hospital with a 10-day history of constant chest pain that was somewhat reproducible with palpation but also worse with exertion.  High-sensitivity troponins were normal.  Stress testing showed anterior and apical ischemia.  Diagnostic LHC showed 95% stenosis in the ostial LAD, and otherwise nonobstructive disease.  The LAD was treated successfully PCI/DES with improvement in exertional symptoms.  Cath was performed via right ulnar approach and complicated by swelling and pain of the right forearm with paresthesias of the fingers on the right hand post procedure.  Ultrasound showed a hematoma within the superficial soft tissues of the right upper extremity, however no evidence of pseudoaneurysm or AV fistula was noted.  He returned to the ED 2 days later with ongoing right forearm swelling and ecchymosis.  Repeat ultrasound again showed soft tissue hematoma measuring up to 5.1 cm and was largely unchanged.  He was seen in follow-up in 09/2021 with recurrent constant low-level chest tightness that worsened with exertion similar to what he experienced prior to stenting.  Outpatient labs were notable for  serum creatinine 1.41, mild leukocytosis with a WBC 13.0 and an elevated D-dimer.  In this setting, he was referred to the ED for hydration and CTA, which was negative for PE.  He was admitted a day later with refractory angina and underwent diagnostic LHC which showed patent LAD stent with otherwise nonobstructive disease.  It was noted he had an LVOT gradient of 50 to 80 mmHg at rest 110 mmHg provocation.  He was seen again in the ED later in 09/2021 with chest pain with unremarkable workup.  He subsequently underwent cardiac MRI in late 09/2021 which showed normal LV systolic function without evidence for hypertrophic cardiomyopathy or LVOT gradient.  In follow-up, he has continued to note persistent dyspnea with minimal exertion.  He was seen in the ED in 03/2022 with chest pain with unrevealing workup.  He was most recently seen in the ED in 06/2022 with chest pain with unrevealing workup.   He presented to [facility] on 09/07/2022 with a 1 week history of exertional fatigue and dyspnea occurring with ambulation of up to 10 to 20 feet along with intermittent bandlike chest discomfort that has been rated 5 out of 10 for the most part.  However, last evening this pain radiated up to his left shoulder and was 8 out of 10 persisting until the early morning hours this morning.  When he woke up, he continued to note this 8 out of 10 chest pain (pain did not wake the patient up).  Symptoms of pain and exertional fatigue/dyspnea feels similar to what he was experiencing leading up to his PCI last year.  He  also notes some left lower quadrant abdominal discomfort and pain/hyperpigmentation along the bilateral lower extremities along the medial portion above the ankles.  Lastly, he notes some intermittent BRBPR since undergoing colonoscopy 4 years ago with stool this morning that was considerably darker than what the typically has.  No BRBPR with stool this morning.  He did miss approximately 3 days of ticagrelor 1.5 weeks ago  as the pharmacy did not have it.  Given symptoms of chest pain and exertional dyspnea he presented to [facility] ED.  Upon arrival, he was hemodynamically stable with oxygen saturation 100% on room air.  Afebrile.  Negative high-sensitivity troponin x 3.  Chest x-ray without active cardiopulmonary disease.  Lower extremity venous duplex negative for DVT bilaterally.  CTA chest negative for PE with no acute or inflammatory process identified.  CT abdomen/pelvis showed no acute process with chronic thoracolumbar spinal ankylosis and diffuse idiopathic skeletal hyperostosis.  In the ED, he received ASA 324 mg x 1, 0.5 mg Dilaudid, 4 mg morphine, 4 mg of Zofran.  He continues to note to 7 out of 10 substernal chest pressure radiating to the left shoulder.    Past Medical History:  Diagnosis Date   Asthma    CAD (coronary artery disease)    a. 08/2017 Cath: Diff nonobs dzs, EF 45-50%, diff HK; b. 08/2021 MV: Ant/apical ishemia; c. 08/2021 PCI: LAD 95ost (4.0x15 Onyx Frontier DES), EF 55-65%; c. 09/2021 Cath: LM nl, LAD patent stent, 64m, 30d, RI min irregs, LCX min irregs, RCA 50p. LVOT grad 50-58mmHg (rest), 70-144mmHg (provoked).   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 08/2017 LV gram: EF 45-50%, glob HK; b. 08/2021 Echo: EF 60-65%, no rwma, GrI DD, nl RV fxn; c. 09/2021 cMRI: EF 60%, no LVOT obstruction/gradient. Mild conc LVH, mild AS. No myocardial scar/fibrosis. No evidence of HCM.   Hyperlipidemia    Leg swelling    Left   Low back pain    Mixed ICM & NICM    a. 08/2017 LV gram: EF 45-50%, glob HK; b. 08/2021 Echo: EF 60-65%.   Morbid obesity    OSA on CPAP    non compliant   Pulmonary hypertension     Past Surgical History:  Procedure Laterality Date   CARDIAC CATHETERIZATION     [place] no stents.   COLONOSCOPY WITH  PROPOFOL N/A 04/11/2019   Procedure: COLONOSCOPY WITH PROPOFOL;  Surgeon: Midge Minium, MD;  Location: [facility] ENDOSCOPY;  Service: Endoscopy;  Laterality: N/A;   CORONARY STENT INTERVENTION N/A 09/08/2021   Procedure: CORONARY STENT INTERVENTION;  Surgeon: [name], MD;  Location: [facility] INVASIVE CV LAB;  Service: Cardiovascular;  Laterality: N/A;   CORONARY ULTRASOUND/IVUS N/A 09/08/2021   Procedure: Intravascular Ultrasound/IVUS;  Surgeon: [name], MD;  Location: [facility] INVASIVE CV LAB;  Service: Cardiovascular;  Laterality: N/A;   LEFT HEART CATH AND CORONARY ANGIOGRAPHY N/A 09/08/2021   Procedure: LEFT HEART CATH AND CORONARY ANGIOGRAPHY;  Surgeon: [name], MD;  Location: [facility] INVASIVE CV LAB;  Service: Cardiovascular;  Laterality: N/A;   LEFT HEART CATH AND CORONARY ANGIOGRAPHY N/A 09/17/2021   Procedure: LEFT HEART CATH AND CORONARY ANGIOGRAPHY;  Surgeon: Yvonne Kendall, MD;  Location: [facility] INVASIVE CV LAB;  Service: Cardiovascular;  Laterality: N/A;   RIGHT/LEFT HEART CATH AND  CORONARY ANGIOGRAPHY N/A 09/02/2017   Procedure: RIGHT/LEFT HEART CATH AND CORONARY ANGIOGRAPHY;  Surgeon: [name], MD;  Location: [facility] INVASIVE CV LAB;  Service: Cardiovascular;  Laterality: N/A;     Home Meds: Prior to Admission medications   Medication Sig Start Date End Date Taking? Authorizing Provider  albuterol (VENTOLIN HFA) 108 (90 Base) MCG/ACT inhaler Inhale 1-2 puffs into the lungs every 6 (six) hours as needed for wheezing or shortness of breath. 09/11/20  Yes Glenford Bayley, NP  aspirin EC 81 MG tablet Take 1 tablet (81 mg total) by mouth daily. Swallow whole. 12/02/21  Yes Creig Hines, NP  atorvastatin (LIPITOR) 40 MG tablet Take 1 tablet (40 mg total) by mouth daily at 6 PM. 09/09/21  Yes Amin, Ankit Chirag, MD  BRILINTA 90 MG TABS tablet Take 1 tablet (90 mg total) by mouth 2 (two) times daily. 08/13/22  Yes [name], MD  DULoxetine (CYMBALTA) 60  MG capsule Take 120 mg by mouth daily. 08/10/22  Yes [provider]  fluticasone-salmeterol (ADVAIR) 250-50 MCG/ACT AEPB Inhale 1 puff into the lungs every 12 (twelve) hours. 01/29/22  Yes Glenford Bayley, NP  furosemide (LASIX) 20 MG tablet Take 1 tablet (20 mg total) by mouth daily as needed (for SOB or weight gain of 3 lbs overnight). 07/14/21  Yes [name], MD  gabapentin (NEURONTIN) 300 MG capsule Take 1 capsule by mouth at bedtime. 09/04/22 09/04/23 Yes [provider]  glipiZIDE (GLUCOTROL) 5 MG tablet Take 1 tablet (5 mg total) by mouth 2 (two) times daily before a meal. 12/09/17  Yes Ethelda Chick, MD  JARDIANCE 25 MG TABS tablet Take 25 mg by mouth daily. 06/06/20  Yes [provider]  metFORMIN (GLUCOPHAGE) 1000 MG tablet Take 1 tablet (1,000 mg total) by mouth 2 (two) times daily with a meal. 12/09/17  Yes Ethelda Chick, MD  metoprolol tartrate (LOPRESSOR) 100 MG tablet Take 1 tablet (100 mg total) by mouth 2 (two) times daily. 08/10/22  Yes End, Cristal Deer, MD  cyclobenzaprine (FLEXERIL) 10 MG tablet Take 1 tablet (10 mg total) by mouth 3 (three) times daily as needed.For spasms 11/20/17   Ethelda Chick, MD  DULoxetine (CYMBALTA) 30 MG capsule Take 2 capsules (60 mg total) by mouth daily. Patient not taking: Reported on 09/07/2022 09/09/21   Dimple Nanas, MD  famotidine (PEPCID) 40 MG tablet Take 40 mg by mouth daily.  08/14/18   [provider]  glucose blood (GE100 BLOOD GLUCOSE TEST) test strip Use as instructed 02/03/16   Ethelda Chick, MD  Lancets [facility] ULTRASOFT) lancets Check sugar twice daily 01/11/16   Ethelda Chick, MD  traMADol (ULTRAM) 50 MG tablet Take 1 tablet (50 mg total) by mouth every 6 (six) hours as needed. 09/29/21 09/29/22  Jene Every, MD  traZODone (DESYREL) 50 MG tablet Take 50-100 mg by mouth at bedtime. 08/19/21   [provider]    Inpatient Medications: Scheduled Meds:  [START ON 09/08/2022]  aspirin EC  81 mg Oral Daily   atorvastatin  40 mg Oral q1800   DULoxetine  120 mg Oral Daily   insulin aspart  0-5 Units Subcutaneous QHS   insulin aspart  0-9 Units Subcutaneous TID WC   metoprolol tartrate  100 mg Oral BID   [START ON 09/08/2022] mometasone-formoterol  2 puff Inhalation BID   pantoprazole  40 mg Oral BID   Continuous Infusions:  PRN Meds: acetaminophen, albuterol, cyclobenzaprine,  dextromethorphan-guaiFENesin, hydrALAZINE, HYDROmorphone (DILAUDID) injection, nitroGLYCERIN, ondansetron (ZOFRAN) IV, oxyCODONE-acetaminophen, traZODone  Allergies:  No Known Allergies  Social History:   Social History   Socioeconomic History   Marital status: Married    Spouse name: Not on file   Number of children: Not on file   Years of education: Not on file   Highest education level: Not on file  Occupational History   Occupation: Quarry manager    Employer: [employer]  Tobacco Use   Smoking status: Never   Smokeless tobacco: Never  Vaping Use   Vaping Use: Never used  Substance and Sexual Activity   Alcohol use: Yes    Comment: few times per year/ socially   Drug use: No   Sexual activity: Yes  Other Topics Concern   Not on file  Social History Narrative   Goes to gym.   Social Determinants of Health   Financial Resource Strain: Not on file  Food Insecurity: No Food Insecurity (09/07/2022)   Hunger Vital Sign    Worried About Running Out of Food in the Last Year: Never true    Ran Out of Food in the Last Year: Never true  Transportation Needs: No Transportation Needs (09/07/2022)   PRAPARE - Administrator, Civil Service (Medical): No    Lack of Transportation (Non-Medical): No  Physical Activity: Not on file  Stress: Not on file  Social Connections: Not on file  Intimate Partner Violence: Not At Risk (09/07/2022)   Humiliation, Afraid, Rape, and Kick questionnaire    Fear of Current or Ex-Partner: No    Emotionally Abused: No    Physically  Abused: No    Sexually Abused: No     Family History:   Family History  Problem Relation Age of Onset   Diabetes Mother    Asthma Mother    Heart disease Father    Heart attack Father    Sjogren's syndrome Sister    Heart disease Brother 42       CAD/CABG    ROS:  Review of Systems  Constitutional:  Positive for malaise/fatigue. Negative for chills, diaphoresis, fever and weight loss.  HENT:  Negative for congestion.   Eyes:  Negative for discharge and redness.  Respiratory:  Positive for shortness of breath. Negative for cough, sputum production and wheezing.   Cardiovascular:  Positive for chest pain and leg swelling. Negative for palpitations, orthopnea, claudication and PND.  Gastrointestinal:  Positive for melena. Negative for abdominal pain, blood in stool, heartburn, nausea and vomiting.  Musculoskeletal:  Negative for falls and myalgias.       Bilateral medial lower extremity discomfort   Skin:  Negative for rash.  Neurological:  Negative for dizziness, tingling, tremors, sensory change, speech change, focal weakness, loss of consciousness and weakness.  Endo/Heme/Allergies:  Does not bruise/bleed easily.  Psychiatric/Behavioral:  Negative for substance abuse. The patient is not nervous/anxious.   All other systems reviewed and are negative.     Physical Exam/Data:   Vitals:   09/07/22 1200 09/07/22 1230 09/07/22 1300 09/07/22 1400  BP: 116/75 115/66 101/71 107/70  Pulse: 88 90  92  Resp: 16 (!) 26  14  Temp:      TempSrc:      SpO2: 97% 94%  100%   No intake or output data in the 24 hours ending 09/07/22 1431 There were no vitals filed for this visit. There is no height or weight on file to calculate BMI.   Physical Exam:  General: Well developed, well nourished, in no acute distress. Head: Normocephalic, atraumatic, sclera non-icteric, no xanthomas, nares without discharge.  Neck: Negative for carotid bruits. JVD not elevated. Lungs: Clear bilaterally to  auscultation without wheezes, rales, or rhonchi. Breathing is unlabored. Heart: RRR with S1 S2. II/VI systolic murmur RUSB, no rubs, or gallops appreciated. Abdomen: Soft, non-tender, non-distended with normoactive bowel sounds. No hepatomegaly. No rebound/guarding. No obvious abdominal masses. Msk:  Strength and tone appear normal for age. Extremities: No clubbing or cyanosis. No edema. Distal pedal pulses are 2+ and equal bilaterally. Neuro: Alert and oriented X 3. No facial asymmetry. No focal deficit. Moves all extremities spontaneously. Psych:  Responds to questions appropriately with a normal affect.   EKG:  The EKG was personally reviewed and demonstrates: 09/07/2022, 08:07 NSR, 90 bpm, low voltage QRS, no acute ST-T changes.  09/07/2022, 10:39 NSR, 89 bpm, low voltage, no acute ST-T changes Telemetry:  Telemetry was personally reviewed and demonstrates: Sinus rhythm  Weights: There were no vitals filed for this visit.  Relevant CV Studies:  [facility] 09/02/2017: Ost LAD lesion is 30% stenosed. Mid LAD lesion is 20% stenosed. Dist LAD lesion is 30% stenosed. Prox RCA lesion is 40% stenosed. The left ventricular systolic function is normal. LV end diastolic pressure is normal. The left ventricular ejection fraction is 45-50% by visual estimate.   1.  Mild nonobstructive coronary artery disease. 2.  Mildly reduced LV systolic function with an EF of 45-50% with global hypokinesis. 3.  Right heart catheterization showed moderately elevated filling pressures with a pulmonary wedge pressure of 22 mmHg, mild pulmonary hypertension and high cardiac output.   Recommendations: The patient seems to have mild nonischemic cardiomyopathy with evidence of volume overload.  I am switching hydrochlorothiazide to furosemide and adding carvedilol.  The patient needs to get his sleep apnea treated __________  2D echo 01/19/2020: 1. Left ventricular ejection fraction, by estimation, is 60 to 65%. The   left ventricle has normal function. The left ventricle has no regional  wall motion abnormalities. Left ventricular diastolic parameters are  consistent with Grade I diastolic  dysfunction (impaired relaxation).   2. Right ventricular systolic function is normal. The right ventricular  size is normal. Tricuspid regurgitation signal is inadequate for assessing  PA pressure.  __________  2D echo 09/05/2021: 1. Left ventricular ejection fraction, by estimation, is 60 to 65%. The  left ventricle has normal function. The left ventricle has no regional  wall motion abnormalities. Left ventricular diastolic parameters are  consistent with Grade I diastolic  dysfunction (impaired relaxation).   2. Right ventricular systolic function is normal. The right ventricular  size is normal.   3. The mitral valve is normal in structure. No evidence of mitral valve  regurgitation. No evidence of mitral stenosis.   4. The aortic valve is normal in structure. Aortic valve regurgitation is  not visualized. Aortic valve sclerosis is present, with no evidence of  aortic valve stenosis.   5. The inferior vena cava is normal in size with greater than 50%  respiratory variability, suggesting right atrial pressure of 3 mmHg.   6. Trivial pericardial effusion is present.  __________  [facility] 09/05/2021:   Findings are equivocal. The study is intermediate risk.   No ST deviation was noted.   LV perfusion is abnormal. Defect 1: There is a medium defect with moderate reduction in uptake present in the apical to mid anterior and anterolateral location(s) that is partially reversible. There is normal wall  motion in the defect area.   Left ventricular function is normal. End diastolic cavity size is normal. End systolic cavity size is normal.   Prior study not available for comparison.   Body habitus, extracardiac activity affect both stress and rest images, but they appear to be worse on stress images. This was  a 2 day study. There is a perfusion defect seen in the mid anterior to apical segments at rest. On stress imaging, there is a more pronounced perfusion defect in the mid to apical anterior and anterolateral wall. There is normal wall motion in this area. Overall cannot exclude ischemia; given image quality, cannot determine if ischemia vs. Artifact on stress imaging. __________  [facility] 09/08/2021:   Mid LAD lesion is 20% stenosed.   Dist LAD lesion is 30% stenosed.   Prox RCA lesion is 50% stenosed.   Ost LAD lesion is 95% stenosed.   A drug-eluting stent was successfully placed using a STENT ONYX FRONTIER 4.0X15.   Post intervention, there is a 0% residual stenosis.   The left ventricular systolic function is normal.   LV end diastolic pressure is mildly elevated.   The left ventricular ejection fraction is 55-65% by visual estimate.   1.  Severe one-vessel coronary artery disease with 95% ostial LAD stenosis likely due to plaque rupture given that this area was only 30% on previous cardiac catheterization in 2019.  Moderate proximal RCA disease does not seem to be different from before. 2.  Normal LV systolic function and mildly elevated left ventricular end-diastolic pressure. 3.  Moderate systolic LVOT gradient of 25 to 30 mmHg 4.  Successful IVUS guided PCI and drug-eluting stent placement to the ostial LAD. 5.  The procedure was done via the right ulnar artery given that the radial artery was very small in size.   Recommendations: Dual antiplatelet therapy for at least 12 months. Aggressive treatment of risk factors. __________  LHC 09/17/2021: Conclusions: Mild-moderate, nonobstructive coronary artery disease similar to the conclusion of last weeks catheterization. Widely patent ostial/proximal LAD stent. Normal left ventricular filling pressure (LVEDP 10-15 mmHg). Significant left ventricular outflow gradient of up to 80 mmHg at rest and 110 mmHg with provocation (post PVC).  Findings  are suspicious for hypertrophic cardiomyopathy.   Recommendations: Continue secondary prevention of coronary artery disease including 12 months of dual antiplatelet therapy with aspirin and ticagrelor from recent PCI to the LAD. Gentle post catheterization hydration with escalation of beta-blockade to help improve LVOT gradient.  Worsening symptoms may be due to a degree of intravascular volume depletion and tachycardia. Consider cardiac MRI for further evaluation of possible hypertrophic cardiomyopathy. __________  Cardiac MRI 10/15/2021: IMPRESSION: 1.  Normal LV size and function, LVEF= 60%. 2.  No LVOT obstruction or gradient noted. 3.  Mild concentric LV wall thickness. 4.  Mild aortic valve stenosis. 5.  No evidence for myocardial fibrosis or scar. 6.  No evidence for HCM.    Laboratory Data:  Chemistry Recent Labs  Lab 09/07/22 0827  NA 134*  K 3.6  CL 99  CO2 23  GLUCOSE 308*  BUN 19  CREATININE 1.27*  CALCIUM 8.5*  GFRNONAA >60  ANIONGAP 12    Recent Labs  Lab 09/07/22 0827  PROT 7.3  ALBUMIN 3.1*  AST 21  ALT 18  ALKPHOS 125  BILITOT 0.7   Hematology Recent Labs  Lab 09/07/22 0827  WBC 9.4  RBC 4.99  HGB 13.4  HCT 41.8  MCV 83.8  MCH 26.9  MCHC 32.1  RDW 13.9  PLT 223   Cardiac EnzymesNo results for input(s): "TROPONINI" in the last 168 hours. No results for input(s): "TROPIPOC" in the last 168 hours.  BNP Recent Labs  Lab 09/07/22 0827  BNP 39.4    DDimer No results for input(s): "DDIMER" in the last 168 hours.  Radiology/Studies:  CT ABDOMEN PELVIS W CONTRAST  Result Date: 09/07/2022 IMPRESSION: 1. No acute or inflammatory process identified in the abdomen or pelvis. Normal appendix. Diverticulosis of the distal colon without active inflammation. 2. CTA Chest reported separately. 3. Chronic thoracolumbar spinal ankylosis, Diffuse idiopathic skeletal hyperostosis (DISH). Electronically Signed   By: Odessa Fleming M.D.   On: 09/07/2022 10:43    CT Angio Chest PE W and/or Wo Contrast  Result Date: 09/07/2022 IMPRESSION: 1. Mildly degraded by respiratory motion. No pulmonary embolus identified. 2. No acute or inflammatory process identified in the Chest. 3. CT Abdomen and Pelvis reported separately. 4. Thoracic spinal ankylosis, Diffuse idiopathic skeletal hyperostosis (DISH). Electronically Signed   By: Odessa Fleming M.D.   On: 09/07/2022 10:38   US Venous Img Lower Bilateral  Result Date: 09/07/2022 IMPRESSION: No evidence of deep venous thrombosis in either lower extremity. Electronically Signed   By: Alcide Clever M.D.   On: 09/07/2022 10:01   DG Chest 2 View  Result Date: 09/07/2022 IMPRESSION: No active cardiopulmonary disease. Electronically Signed   By: Judie Petit.  Shick M.D.   On: 09/07/2022 08:34    Assessment and Plan:   CAD involving native coronary arteries with accelerating angina and exertional dyspnea concerning for anginal equivalent:  -Currently, continues to note 5-7 out of 10 substernal chest pressure that radiates towards the left shoulder -Ruled out  -Continue DAPT with ASA and ticagrelor -Given he has ruled out and in the context of possible dark stools earlier this morning, defer initiation of heparin drip -Patient with known history of chest pain and since undergoing PCI last spring with most recent cath 11 months ago showing patent LAD stent with otherwise nonobstructive disease  -Symptoms similar to what he was experiencing leading up to his PCI last year -Add Imdur 30 mg to achieve chest pain-free -N.p.o. at midnight -After discussion with interventional cardiology, plan for [facility] 4/23 if hemoglobin remains stable -If LHC is shows stable anatomy, would consider escalation of medical therapy for antianginal effect   -Continue aggressive risk factor modification and secondary prevention including aspirin, ticagrelor, metoprolol tartrate, and atorvastatin   HTN:  -Blood pressure well-controlled  -Continue metoprolol    HLD:  -LDL 79 in 08/2021 with goal being at least less than 70  -Remains on atorvastatin 40 mg  -LP(a) previously evaluated and not elevated   Morbid obesity with OSA:  -Weight loss encouraged heartily diet and regular exercise  -CPAP adherence recommended   Dark stool:  -Patient reported an episode of dark stool  -Hgb stable and at baseline ED  -Management per primary service  -If workup is unrevealing and hemoglobin remained stable, may need to consider cardiac cath as outlined above   Shared Decision Making/Informed Consent{  The risks [stroke (1 in 1000), death (1 in 1000), kidney failure [usually temporary] (1 in 500), bleeding (1 in 200), allergic reaction [possibly serious] (1 in 200)], benefits (diagnostic support and management of coronary artery disease) and alternatives of a cardiac catheterization were discussed in detail with Brett Marshall and he is willing to proceed.    For questions or updates, please contact [facility] Please consult [email or web address] for  contact info under Cardiology/STEMI.   Signed, Eula Listen, PA-C [facility] Pager: [phone] 09/07/2022, 2:31 PM

## 2022-09-07 NOTE — Progress Notes (Signed)
ANTICOAGULATION CONSULT NOTE - Initial Consult  Pharmacy Consult for Heparin Drip Indication: chest pain/ACS  No Known Allergies  Patient Measurements: TBW: 144.1 kg (from 09/04/22 MD office visit notes) IBW: 68.4 kg  Heparin Dosing Weight: 103 kg   Vital Signs: Temp: 97.7 F (36.5 C) (04/22 0805) Temp Source: Oral (04/22 0805) BP: 115/66 (04/22 1230) Pulse Rate: 90 (04/22 1230)  Labs: Recent Labs    09/07/22 0827 09/07/22 1015  HGB 13.4  --   HCT 41.8  --   PLT 223  --   CREATININE 1.27*  --   CKTOTAL 38*  --   TROPONINIHS 6 5    CrCl cannot be calculated (Unknown ideal weight.).   Medical History: Past Medical History:  Diagnosis Date   Asthma    CAD (coronary artery disease)    a. 08/2017 Cath: Diff nonobs dzs, EF 45-50%, diff HK; b. 08/2021 MV: Ant/apical ishemia; c. 08/2021 PCI: LAD 95ost (4.0x15 Onyx Frontier DES), EF 55-65%; c. 09/2021 Cath: LM nl, LAD patent stent, 26m, 30d, RI min irregs, LCX min irregs, RCA 50p. LVOT grad 50-25mmHg (rest), 70-140mmHg (provoked).   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 08/2017 LV gram: EF 45-50%, glob HK; b. 08/2021 Echo: EF 60-65%, no rwma, GrI DD, nl RV fxn; c. 09/2021 cMRI: EF 60%, no LVOT obstruction/gradient. Mild conc LVH, mild AS. No myocardial scar/fibrosis. No evidence of HCM.   Hyperlipidemia    Leg swelling    Left   Low back pain    Mixed ICM & NICM    a. 08/2017 LV gram: EF 45-50%, glob HK; b. 08/2021 Echo: EF 60-65%.   Morbid obesity    OSA on CPAP    non compliant   Pulmonary hypertension    Assessment: Patient is a 57yo male pressenting to ED with chest pain. Pharmacy consulted for Heparin dosing. Reviewed prior to admission medications, no anticoagulants noted.  Goal of Therapy:  Heparin level 0.3-0.7 units/ml Monitor platelets by  anticoagulation protocol: Yes   Plan:  Give 4000 units bolus x 1 Start heparin infusion at 1350 units/hr Check anti-Xa level in 6 hours and daily while on heparin Continue to monitor H&H and platelets  Clovia Cuff, PharmD, BCPS 09/07/2022 1:22 PM

## 2022-09-07 NOTE — ED Notes (Signed)
Fuller Plan, MD, made aware of increase in chest pain. Rated at 8/10.

## 2022-09-07 NOTE — H&P (Signed)
History and Physical    Brett Marshall:[id number] DOB: 19-Apr-1966 DOA: 09/07/2022  Referring MD/NP/PA:   PCP: Ethelda Chick, MD   Patient coming from:  The patient is coming from home.    Chief Complaint: chest pain  HPI: Brett Marshall is a 57 y.o. male with medical history significant of CAD, s/p of stent placement, HTN, HLD, DM, asthma, dCHF, GERD, depression, obstructive hypertrophic cardiomyopathy, NASH, pulmonary hypertension, morbid obesity, OSA on CPAP, who presents with chest pain.  Patient states that he has intermittent chest pain for almost a week, which is located in substernal area, 8 out of 10 in severity today, exertional, aggravated by exertion sometimes, radiating to left shoulder and the neck, associated with mild short breath.  Patient has dry cough, no fever or chills.  He has nausea, no vomiting, diarrhea.  He reports mild left lower quadrant abdominal pain.  He states that his stool has been dark recently, every time he has bowel movement.  Also reports bilateral lower leg pain.  No symptoms of UTI.  Patient was given 324 mg of aspirin.   Data reviewed independently and ED Course: pt was found to have troponin 6  --> 5, WBC 9.4, Hgb stable 13.3 on 07/03/22 --> 13.4 today, GFR> 60, temperature normal, blood pressure 97/69, 116/75, heart rate 101, 88, RR 23, oxygen saturation 97% on room air.  Chest x-ray negative.  CT of abdomen/pelvis negative for acute intra-abdominal issues.  CTA negative is motion degraded test, but negative for PE. LE doppler is negative for PE. Patient is placed on telemetry bed for obs. Dr. Kirke Corin of cardiology is consulted.  CT-abd/pelvis: 1. No acute or inflammatory process identified in the abdomen or pelvis. Normal appendix. Diverticulosis of the distal colon without active inflammation. 3. Chronic thoracolumbar spinal ankylosis, Diffuse idiopathic skeletal hyperostosis (DISH).  CTA: 1. Mildly degraded by respiratory motion. No  pulmonary embolus identified. 2. No acute or inflammatory process identified in the Chest. 4. Thoracic spinal ankylosis, Diffuse idiopathic skeletal hyperostosis (DISH).   EKG: I have personally reviewed.  Sinus rhythm, QTc 439, LAE, low voltage, nonspecific T wave change   Review of Systems:   General: no fevers, chills, no body weight gain, fatigue HEENT: no blurry vision, hearing changes or sore throat Respiratory: has dyspnea, coughing, no wheezing CV: has chest pain, no palpitations GI: has nausea,  abdominal pain, dark stool, no diarrhea, constipation, vomiting, GU: no dysuria, burning on urination, increased urinary frequency, hematuria  Ext: has trace leg edema Neuro: no unilateral weakness, numbness, or tingling, no vision change or hearing loss Skin: no rash, no skin tear. MSK: No muscle spasm, no deformity, no limitation of range of movement in spin Heme: No easy bruising.  Travel history: No recent long distant travel.   Allergy: No Known Allergies  Past Medical History:  Diagnosis Date   Asthma    CAD (coronary artery disease)    a. 08/2017 Cath: Diff nonobs dzs, EF 45-50%, diff HK; b. 08/2021 MV: Ant/apical ishemia; c. 08/2021 PCI: LAD 95ost (4.0x15 Onyx Frontier DES), EF 55-65%; c. 09/2021 Cath: LM nl, LAD patent stent, 11m, 30d, RI min irregs, LCX min irregs, RCA 50p. LVOT grad 50-28mmHg (rest), 70-141mmHg (provoked).   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 08/2017 LV gram: EF  45-50%, glob HK; b. 08/2021 Echo: EF 60-65%, no rwma, GrI DD, nl RV fxn; c. 09/2021 cMRI: EF 60%, no LVOT obstruction/gradient. Mild conc LVH, mild AS. No myocardial scar/fibrosis. No evidence of HCM.   Hyperlipidemia    Leg swelling    Left   Low back pain    Mixed ICM & NICM    a. 08/2017 LV gram: EF 45-50%, glob HK; b.  08/2021 Echo: EF 60-65%.   Morbid obesity    OSA on CPAP    non compliant   Pulmonary hypertension     Past Surgical History:  Procedure Laterality Date   CARDIAC CATHETERIZATION     [place] no stents.   COLONOSCOPY WITH PROPOFOL N/A 04/11/2019   Procedure: COLONOSCOPY WITH PROPOFOL;  Surgeon: Midge Minium, MD;  Location: [facility] ENDOSCOPY;  Service: Endoscopy;  Laterality: N/A;   CORONARY STENT INTERVENTION N/A 09/08/2021   Procedure: CORONARY STENT INTERVENTION;  Surgeon: [name], MD;  Location: [facility] INVASIVE CV LAB;  Service: Cardiovascular;  Laterality: N/A;   CORONARY ULTRASOUND/IVUS N/A 09/08/2021   Procedure: Intravascular Ultrasound/IVUS;  Surgeon: [name], MD;  Location: [facility] INVASIVE CV LAB;  Service: Cardiovascular;  Laterality: N/A;   LEFT HEART CATH AND CORONARY ANGIOGRAPHY N/A 09/08/2021   Procedure: LEFT HEART CATH AND CORONARY ANGIOGRAPHY;  Surgeon: [name], MD;  Location: [facility] INVASIVE CV LAB;  Service: Cardiovascular;  Laterality: N/A;   LEFT HEART CATH AND CORONARY ANGIOGRAPHY N/A 09/17/2021   Procedure: LEFT HEART CATH AND CORONARY ANGIOGRAPHY;  Surgeon: Yvonne Kendall, MD;  Location: [facility] INVASIVE CV LAB;  Service: Cardiovascular;  Laterality: N/A;   RIGHT/LEFT HEART CATH AND CORONARY ANGIOGRAPHY N/A 09/02/2017   Procedure: RIGHT/LEFT HEART CATH AND CORONARY ANGIOGRAPHY;  Surgeon: [name], MD;  Location: [facility] INVASIVE CV LAB;  Service: Cardiovascular;  Laterality: N/A;    Social History:  reports that he has never smoked. He has never used smokeless tobacco. He reports current alcohol use. He reports that he does not use drugs.  Family History:  Family History  Problem Relation Age of Onset   Diabetes Mother    Asthma Mother    Heart disease Father    Heart attack Father    Sjogren's syndrome Sister    Heart disease Brother 38       CAD/CABG     Prior to Admission medications   Medication Sig Start Date End Date Taking?  Authorizing Provider  albuterol (VENTOLIN HFA) 108 (90 Base) MCG/ACT inhaler Inhale 1-2 puffs into the lungs every 6 (six) hours as needed for wheezing or shortness of breath. 09/11/20   Glenford Bayley, NP  aspirin EC 81 MG tablet Take 1 tablet (81 mg total) by mouth daily. Swallow whole. 12/02/21   Creig Hines, NP  atorvastatin (LIPITOR) 40 MG tablet Take 1 tablet (40 mg total) by mouth daily at 6 PM. 09/09/21   Amin, Loura Halt, MD  BRILINTA 90 MG TABS tablet Take 1 tablet (90 mg total) by mouth 2 (two) times daily. 08/13/22   [name], MD  cyclobenzaprine (FLEXERIL) 10 MG tablet Take 1 tablet (10 mg total) by mouth 3 (three) times daily as needed.For spasms 11/20/17   Ethelda Chick, MD  DULoxetine (CYMBALTA) 30 MG capsule Take 2 capsules (60 mg total) by mouth daily. 09/09/21   Amin, Loura Halt, MD  famotidine (PEPCID) 40 MG tablet Take 40 mg by mouth daily.  08/14/18   [provider]  fluticasone-salmeterol (ADVAIR)  250-50 MCG/ACT AEPB Inhale 1 puff into the lungs every 12 (twelve) hours. 01/29/22   Glenford Bayley, NP  furosemide (LASIX) 20 MG tablet Take 1 tablet (20 mg total) by mouth daily as needed (for SOB or weight gain of 3 lbs overnight). 07/14/21   [name], MD  glipiZIDE (GLUCOTROL) 5 MG tablet Take 1 tablet (5 mg total) by mouth 2 (two) times daily before a meal. 12/09/17   Ethelda Chick, MD  glucose blood (GE100 BLOOD GLUCOSE TEST) test strip Use as instructed 02/03/16   Ethelda Chick, MD  JARDIANCE 25 MG TABS tablet Take 25 mg by mouth daily. 06/06/20   [provider]  Lancets Letta Pate ULTRASOFT) lancets Check sugar twice daily 01/11/16   Ethelda Chick, MD  metFORMIN (GLUCOPHAGE) 1000 MG tablet Take 1 tablet (1,000 mg total) by mouth 2 (two) times daily with a meal. 12/09/17   Ethelda Chick, MD  metoprolol tartrate (LOPRESSOR) 100 MG tablet Take 1 tablet (100 mg total) by mouth 2 (two) times daily. 08/10/22   End, Cristal Deer,  MD  traMADol (ULTRAM) 50 MG tablet Take 1 tablet (50 mg total) by mouth every 6 (six) hours as needed. 09/29/21 09/29/22  Jene Every, MD  traZODone (DESYREL) 50 MG tablet Take 50-100 mg by mouth at bedtime. 08/19/21   [provider]    Physical Exam: Vitals:   09/07/22 1300 09/07/22 1400 09/07/22 1615 09/07/22 1746  BP: 101/71 107/70 111/75   Pulse:  92 100   Resp:  14 16   Temp:    98.8 F (37.1 C)  TempSrc:    Oral  SpO2:  100% 98%    General: Not in acute distress HEENT:       Eyes: PERRL, EOMI, no scleral icterus.       ENT: No discharge from the ears and nose, no pharynx injection, no tonsillar enlargement.        Neck: No JVD, no bruit, no mass felt. Heme: No neck lymph node enlargement. Cardiac: S1/S2, RRR, No murmurs, No gallops or rubs. Respiratory: No rales, wheezing, rhonchi or rubs. GI: Soft, nondistended, has mild tendness in LLQ, no rebound pain, no organomegaly, BS present. GU: No hematuria Ext: has trace leg edema bilaterally. 1+DP/PT pulse bilaterally. Musculoskeletal: No joint deformities, No joint redness or warmth, no limitation of ROM in spin. Skin: No rashes.  Neuro: Alert, oriented X3, cranial nerves II-XII grossly intact, moves all extremities normally.  Psych: Patient is not psychotic, no suicidal or hemocidal ideation.  Labs on Admission: I have personally reviewed following labs and imaging studies  CBC: Recent Labs  Lab 09/07/22 0827 09/07/22 1622  WBC 9.4 9.9  HGB 13.4 13.1  HCT 41.8 39.6  MCV 83.8 84.1  PLT 223 231   Basic Metabolic Panel: Recent Labs  Lab 09/07/22 0827  NA 134*  K 3.6  CL 99  CO2 23  GLUCOSE 308*  BUN 19  CREATININE 1.27*  CALCIUM 8.5*   GFR: CrCl cannot be calculated (Unknown ideal weight.). Liver Function Tests: Recent Labs  Lab 09/07/22 0827  AST 21  ALT 18  ALKPHOS 125  BILITOT 0.7  PROT 7.3  ALBUMIN 3.1*   Recent Labs  Lab 09/07/22 0827  LIPASE 22   No results for input(s):  "AMMONIA" in the last 168 hours. Coagulation Profile: Recent Labs  Lab 09/07/22 1308  INR 1.1   Cardiac Enzymes: Recent Labs  Lab 09/07/22 0827  CKTOTAL 38*  BNP (last 3 results) No results for input(s): "PROBNP" in the last 8760 hours. HbA1C: Recent Labs    09/07/22 1308  HGBA1C 11.7*   CBG: Recent Labs  Lab 09/07/22 1618  GLUCAP 246*   Lipid Profile: No results for input(s): "CHOL", "HDL", "LDLCALC", "TRIG", "CHOLHDL", "LDLDIRECT" in the last 72 hours. Thyroid Function Tests: No results for input(s): "TSH", "T4TOTAL", "FREET4", "T3FREE", "THYROIDAB" in the last 72 hours. Anemia Panel: No results for input(s): "VITAMINB12", "FOLATE", "FERRITIN", "TIBC", "IRON", "RETICCTPCT" in the last 72 hours. Urine analysis:    Component Value Date/Time   COLORURINE STRAW (A) 09/07/2022 1308   APPEARANCEUR HAZY (A) 09/07/2022 1308   APPEARANCEUR Clear 12/08/2017 1156   LABSPEC >1.046 (H) 09/07/2022 1308   PHURINE 5.0 09/07/2022 1308   GLUCOSEU >=500 (A) 09/07/2022 1308   HGBUR SMALL (A) 09/07/2022 1308   BILIRUBINUR NEGATIVE 09/07/2022 1308   BILIRUBINUR negative 12/08/2017 1217   BILIRUBINUR Negative 12/08/2017 1156   KETONESUR NEGATIVE 09/07/2022 1308   PROTEINUR NEGATIVE 09/07/2022 1308   UROBILINOGEN 0.2 12/08/2017 1217   NITRITE NEGATIVE 09/07/2022 1308   LEUKOCYTESUR LARGE (A) 09/07/2022 1308   Sepsis Labs: @LABRCNTIP (procalcitonin:4,lacticidven:4) )No results found for this or any previous visit (from the past 240 hour(s)).   Radiological Exams on Admission: CT ABDOMEN PELVIS W CONTRAST  Result Date: 09/07/2022 CLINICAL DATA:  57 year old male with chest pain. Shortness of breath. Lower abdominal pain. EXAM: CT ABDOMEN AND PELVIS WITH CONTRAST TECHNIQUE: Multidetector CT imaging of the abdomen and pelvis was performed using the standard protocol following bolus administration of intravenous contrast. RADIATION DOSE REDUCTION: This exam was performed according to  the departmental dose-optimization program which includes automated exposure control, adjustment of the mA and/or kV according to patient size and/or use of iterative reconstruction technique. CONTRAST:  OMNIPAQUE IOHEXOL 350 MG/ML SOLN COMPARISON:  CTA chest today reported separately. CT Abdomen and Pelvis 04/26/2018. FINDINGS: Lower chest: CTA chest is reported separately today. Hepatobiliary: Negative liver and gallbladder. Pancreas: Negative. Spleen: Negative. Adrenals/Urinary Tract: Normal adrenal glands. Stable and negative kidneys. No nephrolithiasis. Decompressed, symmetric ureters. Unremarkable bladder. Stomach/Bowel: Diverticulosis of the descending and sigmoid colon with no active inflammation. Mostly decompressed large bowel, mild retained stool. Negative terminal ileum. Normal appendix coronal image 48. No dilated small bowel. Decompressed stomach and duodenum. No free air, free fluid, or mesenteric inflammation identified. Vascular/Lymphatic: Suboptimal intravascular contrast timing but the major arterial structures appear patent and normal. Minimal calcified iliac artery atherosclerosis. Portal venous system appears to be patent. No lymphadenopathy. Reproductive: Negative. Other: No pelvis free fluid.  Incidental pelvic phleboliths. Musculoskeletal: Lower thoracic through upper lumbar chronic interbody ankylosis from bulky flowing endplate osteophytes. This has mildly progressed since 2019. superimposed lower lumbar degeneration. No acute osseous abnormality identified. SI joints remain patent. IMPRESSION: 1. No acute or inflammatory process identified in the abdomen or pelvis. Normal appendix. Diverticulosis of the distal colon without active inflammation. 2. CTA Chest reported separately. 3. Chronic thoracolumbar spinal ankylosis, Diffuse idiopathic skeletal hyperostosis (DISH). Electronically Signed   By: Odessa Fleming M.D.   On: 09/07/2022 10:43   CT Angio Chest PE W and/or Wo Contrast  Result  Date: 09/07/2022 CLINICAL DATA:  57 year old male with chest pain. Shortness of breath. Lower abdominal pain. EXAM: CT ANGIOGRAPHY CHEST WITH CONTRAST TECHNIQUE: Multidetector CT imaging of the chest was performed using the standard protocol during bolus administration of intravenous contrast. Multiplanar CT image reconstructions and MIPs were obtained to evaluate the vascular anatomy. RADIATION DOSE REDUCTION: This exam was  performed according to the departmental dose-optimization program which includes automated exposure control, adjustment of the mA and/or kV according to patient size and/or use of iterative reconstruction technique. CONTRAST:  OMNIPAQUE IOHEXOL 350 MG/ML SOLN COMPARISON:  Chest radiographs 0818 hours today. CT Abdomen and Pelvis reported separately. Previous chest CTA 09/16/2021. FINDINGS: Cardiovascular: Adequate contrast bolus timing in the pulmonary arterial tree. Mild respiratory motion. No central or lobar pulmonary artery filling defect. No convincing segmental or distal branch pulmonary artery thrombosis. Stable heart size, no cardiomegaly. Incidental mediastinal lipomatosis. No pericardial effusion. Thoracic aorta appears stable and negative. There is chronic left coronary artery stent. Mediastinum/Nodes: Negative. No mediastinal mass or lymphadenopathy. Lungs/Pleura: Stable lung volumes and mild atelectatic changes to the major airways which remain patent. No pleural effusion or consolidation. No suspicious pulmonary nodule or convincing pulmonary inflammation. Mild chronic scarring/atelectasis in the lingula and right middle lobe. Upper Abdomen: CT Abdomen and Pelvis reported separately today. Musculoskeletal: Widespread chronic thoracic spine interbody ankylosis from flowing endplate osteophytes, Diffuse idiopathic skeletal hyperostosis (DISH). T1-T2 spared as before. No acute osseous abnormality identified. Review of the MIP images confirms the above findings. IMPRESSION: 1.  Mildly degraded by respiratory motion. No pulmonary embolus identified. 2. No acute or inflammatory process identified in the Chest. 3. CT Abdomen and Pelvis reported separately. 4. Thoracic spinal ankylosis, Diffuse idiopathic skeletal hyperostosis (DISH). Electronically Signed   By: Odessa Fleming M.D.   On: 09/07/2022 10:38   US Venous Img Lower Bilateral  Result Date: 09/07/2022 CLINICAL DATA:  Lower extremity pain for 2-3 weeks EXAM: BILATERAL LOWER EXTREMITY VENOUS DOPPLER ULTRASOUND TECHNIQUE: Gray-scale sonography with graded compression, as well as color Doppler and duplex ultrasound were performed to evaluate the lower extremity deep venous systems from the level of the common femoral vein and including the common femoral, femoral, profunda femoral, popliteal and calf veins including the posterior tibial, peroneal and gastrocnemius veins when visible. The superficial great saphenous vein was also interrogated. Spectral Doppler was utilized to evaluate flow at rest and with distal augmentation maneuvers in the common femoral, femoral and popliteal veins. COMPARISON:  None Available. FINDINGS: RIGHT LOWER EXTREMITY Common Femoral Vein: No evidence of thrombus. Normal compressibility, respiratory phasicity and response to augmentation. Saphenofemoral Junction: No evidence of thrombus. Normal compressibility and flow on color Doppler imaging. Profunda Femoral Vein: No evidence of thrombus. Normal compressibility and flow on color Doppler imaging. Femoral Vein: No evidence of thrombus. Normal compressibility, respiratory phasicity and response to augmentation. Popliteal Vein: No evidence of thrombus. Normal compressibility, respiratory phasicity and response to augmentation. Calf Veins: No evidence of thrombus. Normal compressibility and flow on color Doppler imaging. Superficial Great Saphenous Vein: No evidence of thrombus. Normal compressibility. Venous Reflux:  None. Other Findings:  None. LEFT LOWER EXTREMITY  Common Femoral Vein: No evidence of thrombus. Normal compressibility, respiratory phasicity and response to augmentation. Saphenofemoral Junction: No evidence of thrombus. Normal compressibility and flow on color Doppler imaging. Profunda Femoral Vein: No evidence of thrombus. Normal compressibility and flow on color Doppler imaging. Femoral Vein: No evidence of thrombus. Normal compressibility, respiratory phasicity and response to augmentation. Popliteal Vein: No evidence of thrombus. Normal compressibility, respiratory phasicity and response to augmentation. Calf Veins: No evidence of thrombus. Normal compressibility and flow on color Doppler imaging. Superficial Great Saphenous Vein: No evidence of thrombus. Normal compressibility. Venous Reflux:  None. Other Findings:  None. IMPRESSION: No evidence of deep venous thrombosis in either lower extremity. Electronically Signed   By: Eulah Pont.D.  On: 09/07/2022 10:01   DG Chest 2 View  Result Date: 09/07/2022 CLINICAL DATA:  Chest pain EXAM: CHEST - 2 VIEW COMPARISON:  07/03/2022 FINDINGS: The heart size and mediastinal contours are within normal limits. Both lungs are clear. The visualized skeletal structures are unremarkable except for degenerative changes throughout the spine. IMPRESSION: No active cardiopulmonary disease. Electronically Signed   By: Judie Petit.  Shick M.D.   On: 09/07/2022 08:34      Assessment/Plan Principal Problem:   Chest pain Active Problems:   CAD (coronary artery disease)   Essential hypertension   Chronic diastolic CHF (congestive heart failure)   HLD (hyperlipidemia)   Type 2 diabetes mellitus without complication, without long-term current use of insulin   Abdominal pain   Dark stools   Depression   OSA on CPAP   Morbid obesity   Assessment and Plan:  Chest pain and hx of CAD: trop  6 --> 5.  Patient has persistent chest pain, concerning for unstable angina.  Initially IV heparin was initially ordered by EDP, but  the patient reports dark stool, IV heparin will be hold off.  Consulted Dr. Kirke Corin of cardiology  - place to tele bed for observation - Trend Trop - prn Nitroglycerin, dilaudid - Aspirin, Brilinta and lipitor - Risk factor stratification: will check FLP and A1C  - check UDS  Essential hypertension:  -IV hydralazine as needed -Metoprolol  Chronic diastolic CHF (congestive heart failure): 2D echo on 09/05/2021 showed EF 60 to 65% with grade 1 diastolic dysfunction.  Patient has trace leg edema, but no JVD.  BNP normal 39.4.  CHF is compensated. -Hold Lasix  HLD (hyperlipidemia) -Lipitor  Type 2 diabetes mellitus without complication, without long-term current use of insulin: Recent A1c 7.5, poorly controlled.  Patient taking metformin, Jardiance and glipizide -Sliding scale insulin  Abdominal pain: Etiology is not clear.  Lipase 22.  CT scan of abdomen/pelvis is negative. -Observe closely -pain control  Dark stools: Patient reports dark stool, but hemoglobin stable, 13.4 (30.3 on 07/03/2022) -Protonix 40 mg twice daily orally -Follow-up CBC every 6 hours -Check INR/PTT/type screen  OSA - on CPAP  Morbid obesity: Body weight 149.7 kg, BMI 50.18 -Encouraged losing weight -Exercise and healthy diet       DVT ppx: on  IV Heparin   Code Status: Full code  Family Communication:   Yes, patient's wife   at bed side.     Disposition Plan:  Anticipate discharge back to previous environment  Consults called: DR. Kirke Corin of card   Admission status and Level of care: Telemetry Cardiac:  for obs   Dispo: The patient is from: Home              Anticipated d/c is to: Home              Anticipated d/c date is: 1 day              Patient currently is not medically stable to d/c.    Severity of Illness:  The appropriate patient status for this patient is OBSERVATION. Observation status is judged to be reasonable and necessary in order to provide the required intensity of  service to ensure the patient's safety. The patient's presenting symptoms, physical exam findings, and initial radiographic and laboratory data in the context of their medical condition is felt to place them at decreased risk for further clinical deterioration. Furthermore, it is anticipated that the patient will be medically stable for discharge from the hospital  within 2 midnights of admission.        Date of Service 09/07/2022    Lorretta Harp [facility]   If 7PM-7AM, please contact night-coverage [email or web address] 09/07/2022, 6:40 PM

## 2022-09-08 ENCOUNTER — Encounter
Admission: EM | Disposition: A | Discharge status: Home / Self Care | Payer: Self-pay | Source: Home / Self Care | Attending: Obstetrics and Gynecology

## 2022-09-08 ENCOUNTER — Inpatient Hospital Stay (HOSPITAL_COMMUNITY)
Admit: 2022-09-08 | Discharge: 2022-09-08 | Disposition: A | Discharge status: Home / Self Care | Payer: BLUE CROSS/BLUE SHIELD | Attending: Internal Medicine | Admitting: Internal Medicine

## 2022-09-08 DIAGNOSIS — E1165 Type 2 diabetes mellitus with hyperglycemia: Secondary | ICD-10-CM | POA: Diagnosis present

## 2022-09-08 DIAGNOSIS — I959 Hypotension, unspecified: Secondary | ICD-10-CM | POA: Diagnosis not present

## 2022-09-08 DIAGNOSIS — I2583 Coronary atherosclerosis due to lipid rich plaque: Secondary | ICD-10-CM

## 2022-09-08 DIAGNOSIS — N390 Urinary tract infection, site not specified: Secondary | ICD-10-CM | POA: Diagnosis present

## 2022-09-08 DIAGNOSIS — I255 Ischemic cardiomyopathy: Secondary | ICD-10-CM | POA: Diagnosis present

## 2022-09-08 DIAGNOSIS — K649 Unspecified hemorrhoids: Secondary | ICD-10-CM | POA: Diagnosis present

## 2022-09-08 DIAGNOSIS — Z955 Presence of coronary angioplasty implant and graft: Secondary | ICD-10-CM | POA: Diagnosis not present

## 2022-09-08 DIAGNOSIS — Z8249 Family history of ischemic heart disease and other diseases of the circulatory system: Secondary | ICD-10-CM | POA: Diagnosis not present

## 2022-09-08 DIAGNOSIS — K219 Gastro-esophageal reflux disease without esophagitis: Secondary | ICD-10-CM | POA: Diagnosis present

## 2022-09-08 DIAGNOSIS — K921 Melena: Secondary | ICD-10-CM | POA: Diagnosis present

## 2022-09-08 DIAGNOSIS — Q248 Other specified congenital malformations of heart: Secondary | ICD-10-CM

## 2022-09-08 DIAGNOSIS — M79661 Pain in right lower leg: Secondary | ICD-10-CM | POA: Diagnosis present

## 2022-09-08 DIAGNOSIS — I350 Nonrheumatic aortic (valve) stenosis: Secondary | ICD-10-CM | POA: Diagnosis not present

## 2022-09-08 DIAGNOSIS — Q244 Congenital subaortic stenosis: Secondary | ICD-10-CM | POA: Diagnosis not present

## 2022-09-08 DIAGNOSIS — R079 Chest pain, unspecified: Secondary | ICD-10-CM

## 2022-09-08 DIAGNOSIS — I5042 Chronic combined systolic (congestive) and diastolic (congestive) heart failure: Secondary | ICD-10-CM | POA: Diagnosis present

## 2022-09-08 DIAGNOSIS — M79662 Pain in left lower leg: Secondary | ICD-10-CM | POA: Diagnosis present

## 2022-09-08 DIAGNOSIS — I209 Angina pectoris, unspecified: Secondary | ICD-10-CM

## 2022-09-08 DIAGNOSIS — I5032 Chronic diastolic (congestive) heart failure: Secondary | ICD-10-CM | POA: Diagnosis not present

## 2022-09-08 DIAGNOSIS — I5022 Chronic systolic (congestive) heart failure: Secondary | ICD-10-CM | POA: Diagnosis not present

## 2022-09-08 DIAGNOSIS — I200 Unstable angina: Secondary | ICD-10-CM | POA: Diagnosis present

## 2022-09-08 DIAGNOSIS — F32A Depression, unspecified: Secondary | ICD-10-CM | POA: Diagnosis not present

## 2022-09-08 DIAGNOSIS — I25119 Atherosclerotic heart disease of native coronary artery with unspecified angina pectoris: Secondary | ICD-10-CM | POA: Diagnosis present

## 2022-09-08 DIAGNOSIS — E119 Type 2 diabetes mellitus without complications: Secondary | ICD-10-CM | POA: Diagnosis not present

## 2022-09-08 DIAGNOSIS — I10 Essential (primary) hypertension: Secondary | ICD-10-CM

## 2022-09-08 DIAGNOSIS — G4733 Obstructive sleep apnea (adult) (pediatric): Secondary | ICD-10-CM | POA: Diagnosis present

## 2022-09-08 DIAGNOSIS — R109 Unspecified abdominal pain: Secondary | ICD-10-CM | POA: Diagnosis not present

## 2022-09-08 DIAGNOSIS — I25110 Atherosclerotic heart disease of native coronary artery with unstable angina pectoris: Secondary | ICD-10-CM | POA: Diagnosis not present

## 2022-09-08 DIAGNOSIS — J45909 Unspecified asthma, uncomplicated: Secondary | ICD-10-CM | POA: Diagnosis present

## 2022-09-08 DIAGNOSIS — E785 Hyperlipidemia, unspecified: Secondary | ICD-10-CM | POA: Diagnosis not present

## 2022-09-08 DIAGNOSIS — Z7984 Long term (current) use of oral hypoglycemic drugs: Secondary | ICD-10-CM | POA: Diagnosis not present

## 2022-09-08 DIAGNOSIS — Z833 Family history of diabetes mellitus: Secondary | ICD-10-CM | POA: Diagnosis not present

## 2022-09-08 DIAGNOSIS — M5136 Other intervertebral disc degeneration, lumbar region: Secondary | ICD-10-CM | POA: Diagnosis present

## 2022-09-08 DIAGNOSIS — I25118 Atherosclerotic heart disease of native coronary artery with other forms of angina pectoris: Secondary | ICD-10-CM | POA: Diagnosis not present

## 2022-09-08 DIAGNOSIS — R195 Other fecal abnormalities: Secondary | ICD-10-CM

## 2022-09-08 DIAGNOSIS — I110 Hypertensive heart disease with heart failure: Secondary | ICD-10-CM | POA: Diagnosis present

## 2022-09-08 DIAGNOSIS — Z6841 Body Mass Index (BMI) 40.0 and over, adult: Secondary | ICD-10-CM | POA: Diagnosis not present

## 2022-09-08 HISTORY — PX: LEFT HEART CATH AND CORONARY ANGIOGRAPHY: CATH118249

## 2022-09-08 LAB — LIPID PANEL
Cholesterol: 109 mg/dL (ref 0–200)
HDL: 31 mg/dL — ABNORMAL LOW (ref >40)
LDL Cholesterol: 50 mg/dL (ref 0–99)
Total CHOL/HDL Ratio: 3.5 RATIO
Triglycerides: 138 mg/dL (ref <150)
VLDL: 28 mg/dL (ref 0–40)

## 2022-09-08 LAB — GLUCOSE, CAPILLARY
Glucose-Capillary: 265 mg/dL — ABNORMAL HIGH (ref 70–99)
Glucose-Capillary: 232 mg/dL — ABNORMAL HIGH (ref 70–99)

## 2022-09-08 LAB — BASIC METABOLIC PANEL
Anion gap: 11 (ref 5–15)
BUN: 15 mg/dL (ref 6–20)
CO2: 23 mmol/L (ref 22–32)
Calcium: 8.6 mg/dL — ABNORMAL LOW (ref 8.9–10.3)
Chloride: 102 mmol/L (ref 98–111)
Creatinine, Ser: 1.23 mg/dL (ref 0.61–1.24)
GFR, Estimated: >60 mL/min (ref >60)
Glucose, Bld: 214 mg/dL — ABNORMAL HIGH (ref 70–99)
Potassium: 4.1 mmol/L (ref 3.5–5.1)
Sodium: 136 mmol/L (ref 135–145)

## 2022-09-08 LAB — CBC
HCT: 39 % (ref 39.0–52.0)
Hemoglobin: 12.8 g/dL — ABNORMAL LOW (ref 13.0–17.0)
MCH: 27.8 pg (ref 26.0–34.0)
MCHC: 32.8 g/dL (ref 30.0–36.0)
MCV: 84.6 fL (ref 80.0–100.0)
Platelets: 218 10*3/uL (ref 150–400)
RBC: 4.61 MIL/uL (ref 4.22–5.81)
RDW: 14.1 % (ref 11.5–15.5)
WBC: 8.4 10*3/uL (ref 4.0–10.5)
nRBC: 0 % (ref 0.0–0.2)

## 2022-09-08 LAB — CBG MONITORING, ED
Glucose-Capillary: 187 mg/dL — ABNORMAL HIGH (ref 70–99)
Glucose-Capillary: 206 mg/dL — ABNORMAL HIGH (ref 70–99)

## 2022-09-08 LAB — ECHOCARDIOGRAM COMPLETE

## 2022-09-08 SURGERY — LEFT HEART CATH AND CORONARY ANGIOGRAPHY
Anesthesia: Moderate Sedation

## 2022-09-08 MED ORDER — HEPARIN SODIUM (PORCINE) 1000 UNIT/ML IJ SOLN
INTRAMUSCULAR | Status: AC
  Filled 2022-09-08: qty 10

## 2022-09-08 MED ORDER — MIDAZOLAM HCL 2 MG/2ML IJ SOLN
INTRAMUSCULAR | Status: AC
  Filled 2022-09-08: qty 2

## 2022-09-08 MED ORDER — HEPARIN (PORCINE) IN NACL 2000-0.9 UNIT/L-% IV SOLN
INTRAVENOUS | Status: DC | PRN
  Administered 2022-09-08: 1000 mL

## 2022-09-08 MED ORDER — SODIUM CHLORIDE 0.9 % IV SOLN
1.0000 g | INTRAVENOUS | Status: DC
  Administered 2022-09-08 – 2022-09-09 (×2): 1 g via INTRAVENOUS
  Filled 2022-09-08 (×3): qty 10

## 2022-09-08 MED ORDER — HEPARIN (PORCINE) IN NACL 1000-0.9 UT/500ML-% IV SOLN
INTRAVENOUS | Status: AC
  Filled 2022-09-08: qty 1000

## 2022-09-08 MED ORDER — SODIUM CHLORIDE 0.9% FLUSH
3.0000 mL | Freq: Two times a day (BID) | INTRAVENOUS | Status: DC
  Administered 2022-09-09 – 2022-09-10 (×3): 3 mL via INTRAVENOUS

## 2022-09-08 MED ORDER — LABETALOL HCL 5 MG/ML IV SOLN: 10.0000 mg | INTRAVENOUS | Status: AC | PRN

## 2022-09-08 MED ORDER — SODIUM CHLORIDE 0.9% FLUSH: 3.0000 mL | INTRAVENOUS | Status: DC | PRN

## 2022-09-08 MED ORDER — VERAPAMIL HCL 2.5 MG/ML IV SOLN
INTRAVENOUS | Status: DC | PRN
  Administered 2022-09-08 (×2): 2.5 mg via INTRAVENOUS

## 2022-09-08 MED ORDER — MOMETASONE FURO-FORMOTEROL FUM 200-5 MCG/ACT IN AERO
2.0000 | INHALATION_SPRAY | Freq: Two times a day (BID) | RESPIRATORY_TRACT | Status: DC
  Administered 2022-09-09 – 2022-09-11 (×4): 2 via RESPIRATORY_TRACT
  Filled 2022-09-08: qty 8.8

## 2022-09-08 MED ORDER — DILTIAZEM HCL 30 MG PO TABS
30.0000 mg | ORAL_TABLET | Freq: Four times a day (QID) | ORAL | Status: DC
  Administered 2022-09-08 – 2022-09-11 (×8): 30 mg via ORAL
  Filled 2022-09-08 (×8): qty 1

## 2022-09-08 MED ORDER — IOHEXOL 300 MG/ML  SOLN
INTRAMUSCULAR | Status: DC | PRN
  Administered 2022-09-08: 55 mL

## 2022-09-08 MED ORDER — SODIUM CHLORIDE 0.9 % IV SOLN: 250.0000 mL | INTRAVENOUS | Status: DC | PRN

## 2022-09-08 MED ORDER — PERFLUTREN LIPID MICROSPHERE
1.0000 mL | INTRAVENOUS | Status: AC | PRN
  Administered 2022-09-08: 3 mL via INTRAVENOUS

## 2022-09-08 MED ORDER — FENTANYL CITRATE (PF) 100 MCG/2ML IJ SOLN
INTRAMUSCULAR | Status: AC
  Filled 2022-09-08: qty 2

## 2022-09-08 MED ORDER — MIDAZOLAM HCL 2 MG/2ML IJ SOLN
INTRAMUSCULAR | Status: DC | PRN
  Administered 2022-09-08: 1 mg via INTRAVENOUS

## 2022-09-08 MED ORDER — VERAPAMIL HCL 2.5 MG/ML IV SOLN
INTRAVENOUS | Status: AC
  Filled 2022-09-08: qty 2

## 2022-09-08 MED ORDER — LIDOCAINE HCL (PF) 1 % IJ SOLN
INTRAMUSCULAR | Status: DC | PRN
  Administered 2022-09-08: 10 mL

## 2022-09-08 MED ORDER — SODIUM CHLORIDE 0.9 % IV SOLN: INTRAVENOUS | Status: AC

## 2022-09-08 MED ORDER — LIDOCAINE HCL 1 % IJ SOLN
INTRAMUSCULAR | Status: AC
  Filled 2022-09-08: qty 20

## 2022-09-08 MED ORDER — HEPARIN SODIUM (PORCINE) 1000 UNIT/ML IJ SOLN
INTRAMUSCULAR | Status: DC | PRN
  Administered 2022-09-08: 5000 [IU] via INTRAVENOUS

## 2022-09-08 MED ORDER — ENOXAPARIN SODIUM 80 MG/0.8ML IJ SOSY
0.5000 mg/kg | PREFILLED_SYRINGE | INTRAMUSCULAR | Status: DC
  Administered 2022-09-09 – 2022-09-11 (×3): 70 mg via SUBCUTANEOUS
  Filled 2022-09-08 (×3): qty 0.8

## 2022-09-08 MED ORDER — FENTANYL CITRATE (PF) 100 MCG/2ML IJ SOLN
INTRAMUSCULAR | Status: DC | PRN
  Administered 2022-09-08: 25 ug via INTRAVENOUS
  Administered 2022-09-08: 50 ug via INTRAVENOUS

## 2022-09-08 SURGICAL SUPPLY — 10 items
CATH 5FR JL3.5 JR4 ANG PIG MP (CATHETERS) IMPLANT
DEVICE RAD TR BAND REGULAR (VASCULAR PRODUCTS) IMPLANT
DRAPE BRACHIAL (DRAPES) IMPLANT
GLIDESHEATH SLEND SS 6F .021 (SHEATH) IMPLANT
GUIDEWIRE INQWIRE 1.5J.035X260 (WIRE) IMPLANT
INQWIRE 1.5J .035X260CM (WIRE) ×1
PACK CARDIAC CATH (CUSTOM PROCEDURE TRAY) ×1 IMPLANT
PROTECTION STATION PRESSURIZED (MISCELLANEOUS) ×1
SET ATX-X65L (MISCELLANEOUS) IMPLANT
STATION PROTECTION PRESSURIZED (MISCELLANEOUS) IMPLANT

## 2022-09-08 NOTE — Progress Notes (Signed)
Rounding Note    Patient Name: Brett Marshall Date of Encounter: 09/08/2022  [facility] Cardiologist: Lorine Bears, MD   Subjective   Patient continues to have waxing and waning chest pain.  No significant dyspnea at this time though he has not been very active.  Inpatient Medications    Scheduled Meds:  aspirin EC  81 mg Oral Daily   atorvastatin  40 mg Oral q1800   diltiazem  30 mg Oral Q6H   DULoxetine  120 mg Oral Daily   [START ON 09/09/2022] enoxaparin (LOVENOX) injection  0.5 mg/kg Subcutaneous Q24H   insulin aspart  0-5 Units Subcutaneous QHS   insulin aspart  0-9 Units Subcutaneous TID WC   isosorbide mononitrate  30 mg Oral Daily   metoprolol tartrate  100 mg Oral BID   mometasone-formoterol  2 puff Inhalation BID   pantoprazole  40 mg Oral BID   sodium chloride flush  3 mL Intravenous Q12H   ticagrelor  90 mg Oral BID   Continuous Infusions:  sodium chloride 75 mL/hr at 09/08/22 1726   sodium chloride     cefTRIAXone (ROCEPHIN)  IV     PRN Meds: sodium chloride, acetaminophen, albuterol, cyclobenzaprine, dextromethorphan-guaiFENesin, hydrALAZINE, HYDROmorphone (DILAUDID) injection, labetalol, nitroGLYCERIN, ondansetron (ZOFRAN) IV, oxyCODONE-acetaminophen, sodium chloride flush, traZODone   Vital Signs    Vitals:   09/08/22 1400 09/08/22 1430 09/08/22 1500 09/08/22 1619  BP: 107/71 (!) 94/53 114/64 121/75  Pulse: (!) 110 (!) 112 (!) 110 (!) 112  Resp: 20 (!) 27 (!) 29 20  Temp:    97.8 F (36.6 C)  TempSrc:      SpO2: 97% 97% 95% 98%  Weight:       No intake or output data in the 24 hours ending 09/08/22 1746    09/08/2022   12:36 AM 06/30/2022    3:11 PM 06/16/2022    8:15 AM  Last 3 Weights  Weight (lbs) 311 lb 1.1 oz 330 lb 329 lb 6 oz  Weight (kg) 141.1 kg 149.687 kg 149.404 kg      Telemetry    Normal sinus rhythm and sinus tachycardia- Personally Reviewed  ECG    No new tracing.  Physical Exam   GEN: No acute  distress.   Neck: Unable to assess JVP due to body habitus. Cardiac: Tachycardia cardiac but regular with 2/6 systolic murmur.  No rubs or gallops. Respiratory: Clear to auscultation bilaterally. GI: Soft, nontender, non-distended  MS: No edema; No deformity. Neuro:  Nonfocal  Psych: Normal affect   Labs    High Sensitivity Troponin:   Recent Labs  Lab 09/07/22 0827 09/07/22 1015 09/07/22 1308 09/07/22 1622 09/07/22 2126  TROPONINIHS 6 5 5 4 5      Chemistry Recent Labs  Lab 09/07/22 0827 09/08/22 0453  NA 134* 136  K 3.6 4.1  CL 99 102  CO2 23 23  GLUCOSE 308* 214*  BUN 19 15  CREATININE 1.27* 1.23  CALCIUM 8.5* 8.6*  PROT 7.3  --   ALBUMIN 3.1*  --   AST 21  --   ALT 18  --   ALKPHOS 125  --   BILITOT 0.7  --   GFRNONAA >60 >60  ANIONGAP 12 11    Lipids  Recent Labs  Lab 09/08/22 0453  CHOL 109  TRIG 138  HDL 31*  LDLCALC 50  CHOLHDL 3.5    Hematology Recent Labs  Lab 09/07/22 1622 09/07/22 2126 09/08/22 0453  WBC  9.9 8.7 8.4  RBC 4.71 4.47 4.61  HGB 13.1 12.4* 12.8*  HCT 39.6 37.7* 39.0  MCV 84.1 84.3 84.6  MCH 27.8 27.7 27.8  MCHC 33.1 32.9 32.8  RDW 14.0 14.1 14.1  PLT 231 220 218   Thyroid No results for input(s): "TSH", "FREET4" in the last 168 hours.  BNP Recent Labs  Lab 09/07/22 0827  BNP 39.4    DDimer No results for input(s): "DDIMER" in the last 168 hours.   Radiology    CARDIAC CATHETERIZATION  Result Date: 09/08/2022 Conclusions: Mild-moderate, non-obstructive coronary artery disease.  No culprit lesion identified for the patient's chest pain. Normal left ventricular filling pressure. Severe left ventricular outflow tract gradient. Recommendations: Add diltiazem for improved heart rate control. Gentle post-catheterization hydration. Obtain echocardiogram and review last year's cardiac MRI with Dr. Azucena Cecil.  May need to consider TEE for further evaluation of LVOT gradient, as I suspect this is driving Mr. Agostino  symptoms. Aggressive secondary prevention of coronary artery disease.  Ticagrelor could be stopped tomorrow, at it will have been 12 months since PCI to ostial/proximal LAD. Yvonne Kendall, MD [facility]  CT ABDOMEN PELVIS W CONTRAST  Result Date: 09/07/2022 CLINICAL DATA:  57 year old male with chest pain. Shortness of breath. Lower abdominal pain. EXAM: CT ABDOMEN AND PELVIS WITH CONTRAST TECHNIQUE: Multidetector CT imaging of the abdomen and pelvis was performed using the standard protocol following bolus administration of intravenous contrast. RADIATION DOSE REDUCTION: This exam was performed according to the departmental dose-optimization program which includes automated exposure control, adjustment of the mA and/or kV according to patient size and/or use of iterative reconstruction technique. CONTRAST:  OMNIPAQUE IOHEXOL 350 MG/ML SOLN COMPARISON:  CTA chest today reported separately. CT Abdomen and Pelvis 04/26/2018. FINDINGS: Lower chest: CTA chest is reported separately today. Hepatobiliary: Negative liver and gallbladder. Pancreas: Negative. Spleen: Negative. Adrenals/Urinary Tract: Normal adrenal glands. Stable and negative kidneys. No nephrolithiasis. Decompressed, symmetric ureters. Unremarkable bladder. Stomach/Bowel: Diverticulosis of the descending and sigmoid colon with no active inflammation. Mostly decompressed large bowel, mild retained stool. Negative terminal ileum. Normal appendix coronal image 48. No dilated small bowel. Decompressed stomach and duodenum. No free air, free fluid, or mesenteric inflammation identified. Vascular/Lymphatic: Suboptimal intravascular contrast timing but the major arterial structures appear patent and normal. Minimal calcified iliac artery atherosclerosis. Portal venous system appears to be patent. No lymphadenopathy. Reproductive: Negative. Other: No pelvis free fluid.  Incidental pelvic phleboliths. Musculoskeletal: Lower thoracic through upper  lumbar chronic interbody ankylosis from bulky flowing endplate osteophytes. This has mildly progressed since 2019. superimposed lower lumbar degeneration. No acute osseous abnormality identified. SI joints remain patent. IMPRESSION: 1. No acute or inflammatory process identified in the abdomen or pelvis. Normal appendix. Diverticulosis of the distal colon without active inflammation. 2. CTA Chest reported separately. 3. Chronic thoracolumbar spinal ankylosis, Diffuse idiopathic skeletal hyperostosis (DISH). Electronically Signed   By: Odessa Fleming M.D.   On: 09/07/2022 10:43   CT Angio Chest PE W and/or Wo Contrast  Result Date: 09/07/2022 CLINICAL DATA:  57 year old male with chest pain. Shortness of breath. Lower abdominal pain. EXAM: CT ANGIOGRAPHY CHEST WITH CONTRAST TECHNIQUE: Multidetector CT imaging of the chest was performed using the standard protocol during bolus administration of intravenous contrast. Multiplanar CT image reconstructions and MIPs were obtained to evaluate the vascular anatomy. RADIATION DOSE REDUCTION: This exam was performed according to the departmental dose-optimization program which includes automated exposure control, adjustment of the mA and/or kV according to patient size and/or use  of iterative reconstruction technique. CONTRAST:  OMNIPAQUE IOHEXOL 350 MG/ML SOLN COMPARISON:  Chest radiographs 0818 hours today. CT Abdomen and Pelvis reported separately. Previous chest CTA 09/16/2021. FINDINGS: Cardiovascular: Adequate contrast bolus timing in the pulmonary arterial tree. Mild respiratory motion. No central or lobar pulmonary artery filling defect. No convincing segmental or distal branch pulmonary artery thrombosis. Stable heart size, no cardiomegaly. Incidental mediastinal lipomatosis. No pericardial effusion. Thoracic aorta appears stable and negative. There is chronic left coronary artery stent. Mediastinum/Nodes: Negative. No mediastinal mass or lymphadenopathy.  Lungs/Pleura: Stable lung volumes and mild atelectatic changes to the major airways which remain patent. No pleural effusion or consolidation. No suspicious pulmonary nodule or convincing pulmonary inflammation. Mild chronic scarring/atelectasis in the lingula and right middle lobe. Upper Abdomen: CT Abdomen and Pelvis reported separately today. Musculoskeletal: Widespread chronic thoracic spine interbody ankylosis from flowing endplate osteophytes, Diffuse idiopathic skeletal hyperostosis (DISH). T1-T2 spared as before. No acute osseous abnormality identified. Review of the MIP images confirms the above findings. IMPRESSION: 1. Mildly degraded by respiratory motion. No pulmonary embolus identified. 2. No acute or inflammatory process identified in the Chest. 3. CT Abdomen and Pelvis reported separately. 4. Thoracic spinal ankylosis, Diffuse idiopathic skeletal hyperostosis (DISH). Electronically Signed   By: Odessa Fleming M.D.   On: 09/07/2022 10:38   US Venous Img Lower Bilateral  Result Date: 09/07/2022 CLINICAL DATA:  Lower extremity pain for 2-3 weeks EXAM: BILATERAL LOWER EXTREMITY VENOUS DOPPLER ULTRASOUND TECHNIQUE: Gray-scale sonography with graded compression, as well as color Doppler and duplex ultrasound were performed to evaluate the lower extremity deep venous systems from the level of the common femoral vein and including the common femoral, femoral, profunda femoral, popliteal and calf veins including the posterior tibial, peroneal and gastrocnemius veins when visible. The superficial great saphenous vein was also interrogated. Spectral Doppler was utilized to evaluate flow at rest and with distal augmentation maneuvers in the common femoral, femoral and popliteal veins. COMPARISON:  None Available. FINDINGS: RIGHT LOWER EXTREMITY Common Femoral Vein: No evidence of thrombus. Normal compressibility, respiratory phasicity and response to augmentation. Saphenofemoral Junction: No evidence of thrombus.  Normal compressibility and flow on color Doppler imaging. Profunda Femoral Vein: No evidence of thrombus. Normal compressibility and flow on color Doppler imaging. Femoral Vein: No evidence of thrombus. Normal compressibility, respiratory phasicity and response to augmentation. Popliteal Vein: No evidence of thrombus. Normal compressibility, respiratory phasicity and response to augmentation. Calf Veins: No evidence of thrombus. Normal compressibility and flow on color Doppler imaging. Superficial Great Saphenous Vein: No evidence of thrombus. Normal compressibility. Venous Reflux:  None. Other Findings:  None. LEFT LOWER EXTREMITY Common Femoral Vein: No evidence of thrombus. Normal compressibility, respiratory phasicity and response to augmentation. Saphenofemoral Junction: No evidence of thrombus. Normal compressibility and flow on color Doppler imaging. Profunda Femoral Vein: No evidence of thrombus. Normal compressibility and flow on color Doppler imaging. Femoral Vein: No evidence of thrombus. Normal compressibility, respiratory phasicity and response to augmentation. Popliteal Vein: No evidence of thrombus. Normal compressibility, respiratory phasicity and response to augmentation. Calf Veins: No evidence of thrombus. Normal compressibility and flow on color Doppler imaging. Superficial Great Saphenous Vein: No evidence of thrombus. Normal compressibility. Venous Reflux:  None. Other Findings:  None. IMPRESSION: No evidence of deep venous thrombosis in either lower extremity. Electronically Signed   By: Alcide Clever M.D.   On: 09/07/2022 10:01   DG Chest 2 View  Result Date: 09/07/2022 CLINICAL DATA:  Chest pain EXAM: CHEST - 2 VIEW  COMPARISON:  07/03/2022 FINDINGS: The heart size and mediastinal contours are within normal limits. Both lungs are clear. The visualized skeletal structures are unremarkable except for degenerative changes throughout the spine. IMPRESSION: No active cardiopulmonary disease.  Electronically Signed   By: Judie Petit.  Shick M.D.   On: 09/07/2022 08:34    Cardiac Studies   Cardiac MRI (09/08/2022): 1.  Normal LV size and function, LVEF= 60%. 2.  No LVOT obstruction or gradient noted. 3.  Mild concentric LV wall thickness. 4.  Mild aortic valve stenosis. 5.  No evidence for myocardial fibrosis or scar. 6.  No evidence for HCM.  Patient Profile     57 y.o. 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  Assessment & Plan    Accelerating angina: Catheterization today did not show any obvious lesions to explain chest pain present at rest.  Ostial/proximal LAD stent placed a year ago is widely patent. -Continue indefinite aspirin 81 mg daily.  Okay to discontinue ticagrelor tomorrow. -Continue high intensity statin therapy.  LVOT gradient: I am concerned that severely elevated LV OT gradient demonstrated on today's catheterization again is driving Mr. Colborn symptoms.  We will repeat a transthoracic echocardiogram, though I am doubtful that image quality will allow [place] to assess for subaortic membrane.  I have asked Dr. Azucena Cecil to review the cardiac MRI from last year to see if there or any obvious findings to explain elevated LVOT gradient. -Add low-dose diltiazem for improved rate control.  Avoid aggressive escalation of negative inotropes as well as diuresis. -Repeat TTE.  May need TEE to better assess LVOT if cardiac MRI for review is not illuminating.  Heart failure with mildly reduced ejection fraction, improved: LVEDP normal on today's catheterization. -Repeat echocardiogram to reassess LVEF. -Maintain net even to slightly positive fluid balance.  For questions or updates, please contact [facility] Please consult [email or web address] for contact  info under [facility].     Signed, Yvonne Kendall, MD  09/08/2022, 5:46 PM

## 2022-09-08 NOTE — Inpatient Diabetes Management (Addendum)
Inpatient Diabetes Program Recommendations  AACE/ADA: New Consensus Statement on Inpatient Glycemic Control (2015)  Target Ranges:  Prepandial:   less than 140 mg/dL      Peak postprandial:   less than 180 mg/dL (1-2 hours)      Critically ill patients:  140 - 180 mg/dL    Latest Reference Range & Units 09/07/22 08:27  Glucose 70 - 99 mg/dL 161 (H)  (H): Data is abnormally high  Latest Reference Range & Units 09/07/21 09:24 09/07/22 13:08  Hemoglobin A1C 4.8 - 5.6 % 7.5 (H) 11.7 (H)  289 mg/dl    Latest Reference Range & Units 09/07/22 16:18 09/07/22 21:34 09/08/22 08:05  Glucose-Capillary 70 - 99 mg/dL 096 (H)  3 units Novolog 272 (H)  3 units Novolog 187 (H)  2 units Novolog   (H): Data is abnormally high   Admit with: CP/ CAD  History: DM2  Home DM Meds: Jardiance 25 mg Daily      Metformin 1000 mg BID       Glipizide 5 mg BID   Current Orders: Novolog Sensitive Correction Scale/ SSI (0-9 units) TID AC + HS    NPO today for Cardiac Cath    MD- Note Current A1c of 11.7% shows poor control at home.  Do you anticipate pt will need insulin for home?  Pt has been in Cath Lab for several hours this afternoon.  Will plan to see pt 4/25.  Will follow    --Will follow patient during hospitalization--  Ambrose Finland RN, MSN, CDCES Diabetes Coordinator Inpatient Glycemic Control Team Team Pager: [phone] (8a-5p)

## 2022-09-08 NOTE — Hospital Course (Addendum)
Brett Marshall is a 57 y.o. male with medical history significant of CAD, s/p of stent placement, HTN, HLD, DM, asthma, dCHF, GERD, depression, obstructive hypertrophic cardiomyopathy, NASH, pulmonary hypertension, morbid obesity, OSA on CPAP  HPI: to ED 09/07/22 w/ chest pain, intermittent, substernal, x1 week, exertional. Assoc w/ mild LLQ pain and dark stool.  04/22: in ED troponin 6  --> 5, Hgb stable 13.3 on 07/03/22 --> 13.4 today, GFR> 60, temperature normal, blood pressure 97/69, 116/75, heart rate 101, 88, RR 23, oxygen saturation 97% on room air.  Chest x-ray negative.  CT of abdomen/pelvis negative for acute intra-abdominal issues.  CTA Chest showing motion degraded test, but negative for PE. LE doppler is negative for DVT. Patient is placed on telemetry bed for obs r/o ACS. [facility] cardiology is consulted, recs for [facility] tomorrow.  04/23: UA(+) lg Leuk, WBC>50, meeting sepsis criteria w/ elevated HR and RR, started ceftriaxone pending cultures. LHC showed patent ostial/proximal LAD stent, moderate distal LAD and moderate-severe non-dominant RCA disease and severe resting LVOT gradient (~70 mmHg) --> plan TTE, consider TEE, continue secondary prevention measures.   Consultants:  Cardiology   Procedures: 09/08/22 LEFT HEART CATH AND CORONARY ANGIOGRAPHY (N/A)        ASSESSMENT & PLAN:   Principal Problem:   Chest pain Active Problems:   CAD (coronary artery disease)   Essential hypertension   Chronic diastolic CHF (congestive heart failure)   HLD (hyperlipidemia)   Type 2 diabetes mellitus without complication, without long-term current use of insulin   Abdominal pain   Dark stools   Depression   OSA on CPAP   Morbid obesity  Chest pain and hx of CAD concerning for unstable angina.   Given exam, suspect costochondritis  Trop  6 --> 5.   S/p LHC per cardiology - see above  Initially IV heparin was initially ordered by EDP, but the patient reports dark stool, IV heparin will be  held off.   Trend Trop prn Nitroglycerin, dilaudid Aspirin, Brilinta and lipitor Risk factor stratification: will check FLP and A1C  Cardiology following   Complicated UTI Sepsis criteria met w/ tachycardia, tachypnea though may be SIRS d/t chest pain  Likely cause of his abdominal pain given no other findings on CT/exam Ceftriaxone IV Follow UCx  Dark stool Monitor CBC  Essential hypertension:  IV hydralazine as needed Metoprolol   Chronic diastolic CHF (congestive heart failure):  2D echo on 09/05/2021 showed EF 60 to 65% with grade 1 diastolic dysfunction.  Patient has trace leg edema, but no JVD.  BNP normal 39.4.  CHF is compensated. Hold Lasix   HLD (hyperlipidemia) Lipitor   Type 2 diabetes mellitus without complication, without long-term current use of insulin: Recent A1c 7.5, poorly controlled.   Patient taking metformin, Jardiance and glipizide Sliding scale insulin   Dark stools:  Patient reports dark stool, but hemoglobin stable, 13.4 (30.3 on 07/03/2022) Protonix 40 mg twice daily orally Follow-up CBC every 6 hours Check INR/PTT/type screen   OSA on CPAP   Morbid obesity:  Body weight 149.7 kg, BMI 50.18 Encouraged losing weight Exercise and healthy diet      DVT prophylaxis: holding any anticoag/antiplatelet Rx Ppx other than ASA/Brilinta at this time watching CBC  Pertinent IV fluids/nutrition: no continuous IV fluids  Central lines / invasive devices: none  Code Status: FULL CODE  ACP documentation reviewed: none on file   Current Admission Status: inpatient   TOC needs / Dispo plan: anticipate home to previous  environment Barriers to discharge / significant pending items: pend urine culture, pend echo

## 2022-09-08 NOTE — Brief Op Note (Signed)
BRIEF CARDIAC CATHETERIZATION NOTE  09/08/2022  1:16 PM  PATIENT:  Lakoda R Rendell  57 y.o. male  PRE-OPERATIVE DIAGNOSIS:  Coronary artery disease involving the native coronary artery with accelerating angina  POST-OPERATIVE DIAGNOSIS:  Same  PROCEDURE:  Procedure(s): LEFT HEART CATH AND CORONARY ANGIOGRAPHY (N/A)  SURGEON:  Surgeon(s) and Role:    * Lucie Friedlander, Cristal Deer, MD - Primary  FINDINGS: Patent ostial/proximal LAD stent.  There is moderate distal LAD and moderate-severe non-dominant RCA disease. Severe resting LVOT gradient (~70 mmHg).  RECOMMENDATIONS: Obtain TTE; consider TEE or other imaging to exclude subaortic membrane. Secondary prevention of CAD.  Yvonne Kendall, MD [facility]

## 2022-09-08 NOTE — Progress Notes (Signed)
PROGRESS NOTE    Brett Marshall   ZOX:[id number] DOB: 05/31/1965  DOA: 09/07/2022 Date of Service: 09/08/22 PCP: Ethelda Chick, MD     Brief Narrative / Hospital Course:  Brett Marshall is a 57 y.o. male with medical history significant of CAD, s/p of stent placement, HTN, HLD, DM, asthma, dCHF, GERD, depression, obstructive hypertrophic cardiomyopathy, NASH, pulmonary hypertension, morbid obesity, OSA on CPAP  HPI: to ED 09/07/22 w/ chest pain, intermittent, substernal, x1 week, exertional. Assoc w/ mild LLQ pain and dark stool.  04/22: in ED troponin 6  --> 5, Hgb stable 13.3 on 07/03/22 --> 13.4 today, GFR> 60, temperature normal, blood pressure 97/69, 116/75, heart rate 101, 88, RR 23, oxygen saturation 97% on room air.  Chest x-ray negative.  CT of abdomen/pelvis negative for acute intra-abdominal issues.  CTA Chest showing motion degraded test, but negative for PE. LE doppler is negative for DVT. Patient is placed on telemetry bed for obs r/o ACS. [facility] cardiology is consulted, recs for [facility] tomorrow.  04/23: UA(+) lg Leuk, WBC>50, meeting sepsis criteria w/ elevated HR and RR, started ceftriaxone pending cultures. LHC showed patent ostial/proximal LAD stent, moderate distal LAD and moderate-severe non-dominant RCA disease and severe resting LVOT gradient (~70 mmHg) --> plan TTE, consider TEE, continue secondary prevention measures.   Consultants:  Cardiology   Procedures: 09/08/22 LEFT HEART CATH AND CORONARY ANGIOGRAPHY (N/A)        ASSESSMENT & PLAN:   Principal Problem:   Chest pain Active Problems:   CAD (coronary artery disease)   Essential hypertension   Chronic diastolic CHF (congestive heart failure)   HLD (hyperlipidemia)   Type 2 diabetes mellitus without complication, without long-term current use of insulin   Abdominal pain   Dark stools   Depression   OSA on CPAP   Morbid obesity  Chest pain and hx of CAD concerning for unstable angina.   Given exam,  suspect costochondritis  Trop  6 --> 5.   S/p LHC per cardiology - see above  Initially IV heparin was initially ordered by EDP, but the patient reports dark stool, IV heparin will be held off.   Trend Trop prn Nitroglycerin, dilaudid Aspirin, Brilinta and lipitor Risk factor stratification: will check FLP and A1C  Cardiology following   Complicated UTI Sepsis criteria met w/ tachycardia, tachypnea though may be SIRS d/t chest pain  Likely cause of his abdominal pain given no other findings on CT/exam Ceftriaxone IV Follow UCx  Dark stool Monitor CBC  Essential hypertension:  IV hydralazine as needed Metoprolol   Chronic diastolic CHF (congestive heart failure):  2D echo on 09/05/2021 showed EF 60 to 65% with grade 1 diastolic dysfunction.  Patient has trace leg edema, but no JVD.  BNP normal 39.4.  CHF is compensated. Hold Lasix   HLD (hyperlipidemia) Lipitor   Type 2 diabetes mellitus without complication, without long-term current use of insulin: Recent A1c 7.5, poorly controlled.   Patient taking metformin, Jardiance and glipizide Sliding scale insulin   Dark stools:  Patient reports dark stool, but hemoglobin stable, 13.4 (30.3 on 07/03/2022) Protonix 40 mg twice daily orally Follow-up CBC every 6 hours Check INR/PTT/type screen   OSA on CPAP   Morbid obesity:  Body weight 149.7 kg, BMI 50.18 Encouraged losing weight Exercise and healthy diet      DVT prophylaxis: holding any anticoag/antiplatelet Rx Ppx other than ASA/Brilinta at this time watching CBC  Pertinent IV fluids/nutrition: no continuous IV fluids  Central lines / invasive devices: none  Code Status: FULL CODE  ACP documentation reviewed: none on file   Current Admission Status: inpatient   TOC needs / Dispo plan: anticipate home to previous environment Barriers to discharge / significant pending items: pend urine culture, pend echo              Subjective / Brief ROS:  Patient  reports dyspnea is about the same  CP on R about the same  Pain controlled overall Denies new weakness.  Tolerating diet.  Reports no concerns w/ urination/defecation.   Family Communication: wife is at bedside on rounds     Objective Findings:  Vitals:   09/08/22 1400 09/08/22 1430 09/08/22 1500 09/08/22 1619  BP: 107/71 (!) 94/53 114/64 121/75  Pulse: (!) 110 (!) 112 (!) 110 (!) 112  Resp: 20 (!) 27 (!) 29 20  Temp:    97.8 F (36.6 C)  TempSrc:      SpO2: 97% 97% 95% 98%  Weight:       No intake or output data in the 24 hours ending 09/08/22 1638 Filed Weights   09/08/22 0036  Weight: (!) 141.1 kg    Examination:  Physical Exam Constitutional:      General: He is not in acute distress.    Appearance: He is obese. He is not toxic-appearing.  Cardiovascular:     Rate and Rhythm: Regular rhythm. Tachycardia present.     Heart sounds: Normal heart sounds.  Pulmonary:     Effort: Pulmonary effort is normal. No respiratory distress.     Breath sounds: Normal breath sounds.  Chest:     Chest wall: Tenderness (R sternal border / anterior chest on palpation) present.  Abdominal:     Palpations: Abdomen is soft.  Musculoskeletal:     Right lower leg: Edema (trace) present.     Left lower leg: Edema (trace) present.  Skin:    General: Skin is warm and dry.  Neurological:     General: No focal deficit present.     Mental Status: He is alert and oriented to person, place, and time.  Psychiatric:        Mood and Affect: Mood normal.        Behavior: Behavior normal.          Scheduled Medications:   aspirin EC  81 mg Oral Daily   atorvastatin  40 mg Oral q1800   DULoxetine  120 mg Oral Daily   insulin aspart  0-5 Units Subcutaneous QHS   insulin aspart  0-9 Units Subcutaneous TID WC   isosorbide mononitrate  30 mg Oral Daily   metoprolol tartrate  100 mg Oral BID   mometasone-formoterol  2 puff Inhalation BID   pantoprazole  40 mg Oral BID   ticagrelor   90 mg Oral BID    Continuous Infusions:  cefTRIAXone (ROCEPHIN)  IV      PRN Medications:  acetaminophen, albuterol, cyclobenzaprine, dextromethorphan-guaiFENesin, hydrALAZINE, HYDROmorphone (DILAUDID) injection, nitroGLYCERIN, ondansetron (ZOFRAN) IV, oxyCODONE-acetaminophen, traZODone  Antimicrobials from admission:  Anti-infectives (From admission, onward)    Start     Dose/Rate Route Frequency Ordered Stop   09/08/22 0900  cefTRIAXone (ROCEPHIN) 1 g in sodium chloride 0.9 % 100 mL IVPB        1 g 200 mL/hr over 30 Minutes Intravenous Every 24 hours 09/08/22 0849             Data Reviewed:  I have personally reviewed the following.Marland KitchenMarland Kitchen  CBC: Recent Labs  Lab 09/07/22 0827 09/07/22 1622 09/07/22 2126 09/08/22 0453  WBC 9.4 9.9 8.7 8.4  HGB 13.4 13.1 12.4* 12.8*  HCT 41.8 39.6 37.7* 39.0  MCV 83.8 84.1 84.3 84.6  PLT 223 231 220 218   Basic Metabolic Panel: Recent Labs  Lab 09/07/22 0827 09/08/22 0453  NA 134* 136  K 3.6 4.1  CL 99 102  CO2 23 23  GLUCOSE 308* 214*  BUN 19 15  CREATININE 1.27* 1.23  CALCIUM 8.5* 8.6*   GFR: Estimated Creatinine Clearance: 91.4 mL/min (by C-G formula based on SCr of 1.23 mg/dL). Liver Function Tests: Recent Labs  Lab 09/07/22 0827  AST 21  ALT 18  ALKPHOS 125  BILITOT 0.7  PROT 7.3  ALBUMIN 3.1*   Recent Labs  Lab 09/07/22 0827  LIPASE 22   No results for input(s): "AMMONIA" in the last 168 hours. Coagulation Profile: Recent Labs  Lab 09/07/22 1308  INR 1.1   Cardiac Enzymes: Recent Labs  Lab 09/07/22 0827  CKTOTAL 38*   BNP (last 3 results) No results for input(s): "PROBNP" in the last 8760 hours. HbA1C: Recent Labs    09/07/22 1308  HGBA1C 11.7*   CBG: Recent Labs  Lab 09/07/22 1618 09/07/22 2134 09/08/22 0805 09/08/22 0921 09/08/22 1621  GLUCAP 246* 272* 187* 206* 265*   Lipid Profile: Recent Labs    09/08/22 0453  CHOL 109  HDL 31*  LDLCALC 50  TRIG 161  CHOLHDL 3.5    Thyroid Function Tests: No results for input(s): "TSH", "T4TOTAL", "FREET4", "T3FREE", "THYROIDAB" in the last 72 hours. Anemia Panel: No results for input(s): "VITAMINB12", "FOLATE", "FERRITIN", "TIBC", "IRON", "RETICCTPCT" in the last 72 hours. Most Recent Urinalysis On File:     Component Value Date/Time   COLORURINE STRAW (A) 09/07/2022 1308   APPEARANCEUR HAZY (A) 09/07/2022 1308   APPEARANCEUR Clear 12/08/2017 1156   LABSPEC >1.046 (H) 09/07/2022 1308   PHURINE 5.0 09/07/2022 1308   GLUCOSEU >=500 (A) 09/07/2022 1308   HGBUR SMALL (A) 09/07/2022 1308   BILIRUBINUR NEGATIVE 09/07/2022 1308   BILIRUBINUR negative 12/08/2017 1217   BILIRUBINUR Negative 12/08/2017 1156   KETONESUR NEGATIVE 09/07/2022 1308   PROTEINUR NEGATIVE 09/07/2022 1308   UROBILINOGEN 0.2 12/08/2017 1217   NITRITE NEGATIVE 09/07/2022 1308   LEUKOCYTESUR LARGE (A) 09/07/2022 1308   Sepsis Labs: @LABRCNTIP (procalcitonin:4,lacticidven:4) Microbiology: No results found for this or any previous visit (from the past 240 hour(s)).    Radiology Studies last 3 days: CT ABDOMEN PELVIS W CONTRAST  Result Date: 09/07/2022 CLINICAL DATA:  57 year old male with chest pain. Shortness of breath. Lower abdominal pain. EXAM: CT ABDOMEN AND PELVIS WITH CONTRAST TECHNIQUE: Multidetector CT imaging of the abdomen and pelvis was performed using the standard protocol following bolus administration of intravenous contrast. RADIATION DOSE REDUCTION: This exam was performed according to the departmental dose-optimization program which includes automated exposure control, adjustment of the mA and/or kV according to patient size and/or use of iterative reconstruction technique. CONTRAST:  OMNIPAQUE IOHEXOL 350 MG/ML SOLN COMPARISON:  CTA chest today reported separately. CT Abdomen and Pelvis 04/26/2018. FINDINGS: Lower chest: CTA chest is reported separately today. Hepatobiliary: Negative liver and gallbladder. Pancreas:  Negative. Spleen: Negative. Adrenals/Urinary Tract: Normal adrenal glands. Stable and negative kidneys. No nephrolithiasis. Decompressed, symmetric ureters. Unremarkable bladder. Stomach/Bowel: Diverticulosis of the descending and sigmoid colon with no active inflammation. Mostly decompressed large bowel, mild retained stool. Negative terminal ileum. Normal appendix coronal image 48. No dilated small bowel.  Decompressed stomach and duodenum. No free air, free fluid, or mesenteric inflammation identified. Vascular/Lymphatic: Suboptimal intravascular contrast timing but the major arterial structures appear patent and normal. Minimal calcified iliac artery atherosclerosis. Portal venous system appears to be patent. No lymphadenopathy. Reproductive: Negative. Other: No pelvis free fluid.  Incidental pelvic phleboliths. Musculoskeletal: Lower thoracic through upper lumbar chronic interbody ankylosis from bulky flowing endplate osteophytes. This has mildly progressed since 2019. superimposed lower lumbar degeneration. No acute osseous abnormality identified. SI joints remain patent. IMPRESSION: 1. No acute or inflammatory process identified in the abdomen or pelvis. Normal appendix. Diverticulosis of the distal colon without active inflammation. 2. CTA Chest reported separately. 3. Chronic thoracolumbar spinal ankylosis, Diffuse idiopathic skeletal hyperostosis (DISH). Electronically Signed   By: Odessa Fleming M.D.   On: 09/07/2022 10:43   CT Angio Chest PE W and/or Wo Contrast  Result Date: 09/07/2022 CLINICAL DATA:  57 year old male with chest pain. Shortness of breath. Lower abdominal pain. EXAM: CT ANGIOGRAPHY CHEST WITH CONTRAST TECHNIQUE: Multidetector CT imaging of the chest was performed using the standard protocol during bolus administration of intravenous contrast. Multiplanar CT image reconstructions and MIPs were obtained to evaluate the vascular anatomy. RADIATION DOSE REDUCTION: This exam was performed  according to the departmental dose-optimization program which includes automated exposure control, adjustment of the mA and/or kV according to patient size and/or use of iterative reconstruction technique. CONTRAST:  OMNIPAQUE IOHEXOL 350 MG/ML SOLN COMPARISON:  Chest radiographs 0818 hours today. CT Abdomen and Pelvis reported separately. Previous chest CTA 09/16/2021. FINDINGS: Cardiovascular: Adequate contrast bolus timing in the pulmonary arterial tree. Mild respiratory motion. No central or lobar pulmonary artery filling defect. No convincing segmental or distal branch pulmonary artery thrombosis. Stable heart size, no cardiomegaly. Incidental mediastinal lipomatosis. No pericardial effusion. Thoracic aorta appears stable and negative. There is chronic left coronary artery stent. Mediastinum/Nodes: Negative. No mediastinal mass or lymphadenopathy. Lungs/Pleura: Stable lung volumes and mild atelectatic changes to the major airways which remain patent. No pleural effusion or consolidation. No suspicious pulmonary nodule or convincing pulmonary inflammation. Mild chronic scarring/atelectasis in the lingula and right middle lobe. Upper Abdomen: CT Abdomen and Pelvis reported separately today. Musculoskeletal: Widespread chronic thoracic spine interbody ankylosis from flowing endplate osteophytes, Diffuse idiopathic skeletal hyperostosis (DISH). T1-T2 spared as before. No acute osseous abnormality identified. Review of the MIP images confirms the above findings. IMPRESSION: 1. Mildly degraded by respiratory motion. No pulmonary embolus identified. 2. No acute or inflammatory process identified in the Chest. 3. CT Abdomen and Pelvis reported separately. 4. Thoracic spinal ankylosis, Diffuse idiopathic skeletal hyperostosis (DISH). Electronically Signed   By: Odessa Fleming M.D.   On: 09/07/2022 10:38   US Venous Img Lower Bilateral  Result Date: 09/07/2022 CLINICAL DATA:  Lower extremity pain for 2-3 weeks EXAM:  BILATERAL LOWER EXTREMITY VENOUS DOPPLER ULTRASOUND TECHNIQUE: Gray-scale sonography with graded compression, as well as color Doppler and duplex ultrasound were performed to evaluate the lower extremity deep venous systems from the level of the common femoral vein and including the common femoral, femoral, profunda femoral, popliteal and calf veins including the posterior tibial, peroneal and gastrocnemius veins when visible. The superficial great saphenous vein was also interrogated. Spectral Doppler was utilized to evaluate flow at rest and with distal augmentation maneuvers in the common femoral, femoral and popliteal veins. COMPARISON:  None Available. FINDINGS: RIGHT LOWER EXTREMITY Common Femoral Vein: No evidence of thrombus. Normal compressibility, respiratory phasicity and response to augmentation. Saphenofemoral Junction: No evidence of thrombus. Normal compressibility  and flow on color Doppler imaging. Profunda Femoral Vein: No evidence of thrombus. Normal compressibility and flow on color Doppler imaging. Femoral Vein: No evidence of thrombus. Normal compressibility, respiratory phasicity and response to augmentation. Popliteal Vein: No evidence of thrombus. Normal compressibility, respiratory phasicity and response to augmentation. Calf Veins: No evidence of thrombus. Normal compressibility and flow on color Doppler imaging. Superficial Great Saphenous Vein: No evidence of thrombus. Normal compressibility. Venous Reflux:  None. Other Findings:  None. LEFT LOWER EXTREMITY Common Femoral Vein: No evidence of thrombus. Normal compressibility, respiratory phasicity and response to augmentation. Saphenofemoral Junction: No evidence of thrombus. Normal compressibility and flow on color Doppler imaging. Profunda Femoral Vein: No evidence of thrombus. Normal compressibility and flow on color Doppler imaging. Femoral Vein: No evidence of thrombus. Normal compressibility, respiratory phasicity and response to  augmentation. Popliteal Vein: No evidence of thrombus. Normal compressibility, respiratory phasicity and response to augmentation. Calf Veins: No evidence of thrombus. Normal compressibility and flow on color Doppler imaging. Superficial Great Saphenous Vein: No evidence of thrombus. Normal compressibility. Venous Reflux:  None. Other Findings:  None. IMPRESSION: No evidence of deep venous thrombosis in either lower extremity. Electronically Signed   By: Alcide Clever M.D.   On: 09/07/2022 10:01   DG Chest 2 View  Result Date: 09/07/2022 CLINICAL DATA:  Chest pain EXAM: CHEST - 2 VIEW COMPARISON:  07/03/2022 FINDINGS: The heart size and mediastinal contours are within normal limits. Both lungs are clear. The visualized skeletal structures are unremarkable except for degenerative changes throughout the spine. IMPRESSION: No active cardiopulmonary disease. Electronically Signed   By: Judie Petit.  Shick M.D.   On: 09/07/2022 08:34             LOS: 0 days      Sunnie Nielsen, DO [facility] 09/08/2022, 4:38 PM    Dictation software may have been used to generate the above note. Typos may occur and escape review in typed/dictated notes. Please contact Dr Lyn Hollingshead directly for clarity if needed.  Staff may message me via secure chat in Epic  but this may not receive an immediate response,  please page me for urgent matters!  If 7PM-7AM, please contact night coverage [email or web address]

## 2022-09-08 NOTE — Interval H&P Note (Signed)
History and Physical Interval Note:  09/08/2022 9:45 AM  Brett Marshall  has presented today for surgery, with the diagnosis of coronary artery disease involving the native coronary artery with accelerating angina.  The various methods of treatment have been discussed with the patient and family. After consideration of risks, benefits and other options for treatment, the patient has consented to  Procedure(s): LEFT HEART CATH AND CORONARY ANGIOGRAPHY (N/A) as a surgical intervention.  The patient's history has been reviewed, patient examined, no change in status, stable for surgery.  I have reviewed the patient's chart and labs.  Questions were answered to the patient's satisfaction.    Cath Lab Visit (complete for each Cath Lab visit)  Clinical Evaluation Leading to the Procedure:   ACS: Yes.    Non-ACS:  N/A  Sarath Privott

## 2022-09-08 NOTE — ED Notes (Signed)
Pt ambulatory independently to bathroom with steady gait.

## 2022-09-09 ENCOUNTER — Encounter: Payer: Self-pay | Admitting: Internal Medicine

## 2022-09-09 ENCOUNTER — Other Ambulatory Visit (HOSPITAL_COMMUNITY): Payer: Self-pay

## 2022-09-09 DIAGNOSIS — G4733 Obstructive sleep apnea (adult) (pediatric): Secondary | ICD-10-CM | POA: Diagnosis not present

## 2022-09-09 DIAGNOSIS — E119 Type 2 diabetes mellitus without complications: Secondary | ICD-10-CM | POA: Diagnosis not present

## 2022-09-09 DIAGNOSIS — R079 Chest pain, unspecified: Secondary | ICD-10-CM | POA: Diagnosis not present

## 2022-09-09 DIAGNOSIS — I25118 Atherosclerotic heart disease of native coronary artery with other forms of angina pectoris: Secondary | ICD-10-CM

## 2022-09-09 LAB — ECHOCARDIOGRAM COMPLETE
Area-P 1/2: 5.9 cm2
S' Lateral: 2.7 cm
Weight: 4977.1 oz

## 2022-09-09 LAB — GLUCOSE, CAPILLARY
Glucose-Capillary: 203 mg/dL — ABNORMAL HIGH (ref 70–99)
Glucose-Capillary: 263 mg/dL — ABNORMAL HIGH (ref 70–99)
Glucose-Capillary: 213 mg/dL — ABNORMAL HIGH (ref 70–99)
Glucose-Capillary: 285 mg/dL — ABNORMAL HIGH (ref 70–99)

## 2022-09-09 LAB — URINE CULTURE

## 2022-09-09 MED ORDER — INSULIN GLARGINE-YFGN 100 UNIT/ML ~~LOC~~ SOLN
10.0000 [IU] | Freq: Every day | SUBCUTANEOUS | Status: DC
  Administered 2022-09-09 – 2022-09-10 (×2): 10 [IU] via SUBCUTANEOUS
  Filled 2022-09-09 (×2): qty 0.1

## 2022-09-09 MED ORDER — SODIUM CHLORIDE 0.9 % IV SOLN: INTRAVENOUS | Status: DC

## 2022-09-09 MED ORDER — POLYETHYLENE GLYCOL 3350 17 G PO PACK
17.0000 g | PACK | Freq: Every day | ORAL | Status: DC
  Administered 2022-09-09: 17 g via ORAL
  Filled 2022-09-09 (×3): qty 1

## 2022-09-09 NOTE — Inpatient Diabetes Management (Addendum)
Inpatient Diabetes Program Recommendations  AACE/ADA: New Consensus Statement on Inpatient Glycemic Control   Target Ranges:  Prepandial:   less than 140 mg/dL      Peak postprandial:   less than 180 mg/dL (1-2 hours)      Critically ill patients:  140 - 180 mg/dL    Latest Reference Range & Units 09/09/22 08:14 09/09/22 12:11  Glucose-Capillary 70 - 99 mg/dL 629 (H) 528 (H)    Latest Reference Range & Units 09/08/22 08:05 09/08/22 09:21 09/08/22 16:21 09/08/22 22:44  Glucose-Capillary 70 - 99 mg/dL 413 (H) 244 (H) 010 (H) 232 (H)    Latest Reference Range & Units 09/07/22 13:08  Hemoglobin A1C 4.8 - 5.6 % 11.7 (H)   Review of Glycemic Control  Diabetes history: DM2 Outpatient Diabetes medications: Jardiance 25 mg daily, Metformin 1000 mg BID, Glipizide 5 mg BID (seen PCP on 09/04/22 and was asked to increase Glipizide to 10 mg BID but pt has not been able to increase yet) Current orders for Inpatient glycemic control: Novolog 0-9 units TID with meals, Novolog 0-5 units QHS  Inpatient Diabetes Program Recommendations:    Insulin: Fasting CBG 203 and noon CB 263 mg/dl today. Please consider ordering Semglee 10 units Q24H.  HbgA1C: Current A1C 11.7% on 09/06/21 indicating an average glucose of 289 mg/dl over the past 2-3 months. Per Care Everywhere A1C 12.2% on 09/04/22.   Addendum 09/09/22@13 :35-Spoke with patient and wife at bedside about diabetes and home regimen for diabetes control. Patient reports being followed by PCP for diabetes management and was just seen on this past Friday. Patient reports that at PCP visit, he was asked to increase Glipizide 5 mg BID to 10 mg BID (has not been able to do so since he was feeling poorly and ended up coming to the hospital), continue Jardiance 25 mg daily and Metformin 1000 mg BID.  Patient reports not checking glucose very often at home.  Patient reports that his A1C was "off the chart" at PCP visit on Friday (per chart review it was 12.2% on  09/04/22).  Patient notes that he did have his A1C in the 7% range so he plans to make changes and get DM under better control.  Discussed A1C results (11.7% on 09/04/22) and explained that current A1C indicates an average glucose of 289 mg/dl over the past 2-3 months. Discussed glucose and A1C goals. Discussed importance of checking CBGs and maintaining good CBG control to prevent long-term and short-term complications. Stressed to the patient the importance of improving glycemic control to prevent further complications from uncontrolled diabetes. Discussed impact of nutrition, exercise, stress, sickness, and medications on diabetes control.  Patient reports that he has been drinking a lot of juice recently, eats crunch snacks (chips), and sometimes pasta and pizza. Encouraged patient to eliminate sugary beverages; discussed using diet juice or using sugar free flavor packs for water and limiting high carbohydrate snacks/foods; encouraged to use whole wheat bread and pasta.  Inquired about any prior use of continuous glucose monitoring sensor (CGM) and patient reports that he has not ever used a CGM but he would be interested in using one. Will ask attending provider for permission to give sample of CGM at time of discharge. Encouraged patient to be sure to increase Glipizide as recommended by PCP (unless told to do otherwise at discharge), to eliminate sugary beverages, start monitoring glucose at least 2-3 times a day, and follow up with PCP in the near future.  Patient verbalized understanding of  information discussed and reports no further questions at this time related to diabetes.  Thanks, Orlando Penner, RN, MSN, CDCES Diabetes Coordinator Inpatient Diabetes Program [phone] (Team Pager from 8am to 5pm)

## 2022-09-09 NOTE — Progress Notes (Signed)
PROGRESS NOTE    Brett Marshall   ZOX:[id number] DOB: Jan 01, 1966  DOA: 09/07/2022 Date of Service: 09/09/22 PCP: Ethelda Chick, MD     Brief Narrative / Hospital Course:   Brett Marshall is a 57 y.o. male with medical history significant of CAD, s/p of stent placement, HTN, HLD, DM, asthma, dCHF, GERD, depression, obstructive hypertrophic cardiomyopathy, NASH, pulmonary hypertension, morbid obesity, OSA on CPAP  HPI: to ED 09/07/22 w/ chest pain, intermittent, substernal, x1 week, exertional. Assoc w/ mild LLQ pain and dark stool.  04/22: in ED troponin 6  --> 5, Hgb stable 13.3 on 07/03/22 --> 13.4 today, GFR> 60, temperature normal, blood pressure 97/69, 116/75, heart rate 101, 88, RR 23, oxygen saturation 97% on room air.  Chest x-ray negative.  CT of abdomen/pelvis negative for acute intra-abdominal issues.  CTA Chest showing motion degraded test, but negative for PE. LE doppler is negative for DVT. Patient is placed on telemetry bed for obs r/o ACS. [facility] cardiology is consulted, recs for [facility] tomorrow.  04/23: UA(+) lg Leuk, WBC>50, meeting sepsis criteria w/ elevated HR and RR, started ceftriaxone pending cultures. LHC showed patent ostial/proximal LAD stent, moderate distal LAD and moderate-severe non-dominant RCA disease and severe resting LVOT gradient (~70 mmHg) --> plan TTE, consider TEE, continue secondary prevention measures.    Consultants:  Cardiology    Procedures: 09/08/22 LEFT HEART CATH AND CORONARY ANGIOGRAPHY (N/A)              ASSESSMENT & PLAN:   Principal Problem:   Chest pain Active Problems:   CAD (coronary artery disease)   Essential hypertension   Chronic diastolic CHF (congestive heart failure)   HLD (hyperlipidemia)   Type 2 diabetes mellitus without complication, without long-term current use of insulin   Abdominal pain   Dark stools   Depression   OSA on CPAP   Morbid obesity   Chest pain and hx of CAD concerning for unstable angina.    Given exam, suspect costochondritis  Trop  6 --> 5.   S/p LHC per cardiology - see above  Initially IV heparin was initially ordered by EDP, but the patient reports dark stool, IV heparin will be held off.   Aspirin, Brilinta and lipitor LHC mild/mod CAD, no culprit lesion CTA neg for PE  Elevated left ventricular outflow gradient TEE scheduled for tomorrow to further evaluate   Complicated UTI Sepsis criteria met w/ tachycardia, tachypnea though may be SIRS d/t chest pain  Likely cause of his abdominal pain given no other findings on CT/exam Ceftriaxone IV Follow UCx, result pending   Dark stool Monitor CBC, stable   Essential hypertension:  IV hydralazine as needed Metoprolol   Chronic diastolic CHF (congestive heart failure):  2D echo on 09/05/2021 showed EF 60 to 65% with grade 1 diastolic dysfunction.  Patient has trace leg edema, but no JVD.  BNP normal 39.4.  CHF is compensated.    HLD (hyperlipidemia) Lipitor   Type 2 diabetes mellitus without complication, without long-term current use of insulin: Recent A1c 7.5, poorly controlled.   Patient taking metformin, Jardiance and glipizide Sliding scale insulin   OSA on CPAP   Morbid obesity:  Body weight 149.7 kg, BMI 50.18 Encouraged losing weight Exercise and healthy diet          Subjective / Brief ROS:  Mild chest pain persists, no other complaints  Family Communication: wife is at bedside on rounds     Objective Findings:  Vitals:  09/08/22 2248 09/09/22 0044 09/09/22 0434 09/09/22 0813  BP: 107/66 105/79 111/83 (!) 120/90  Pulse: (!) 107 92 82 70  Resp: 17 20 20 20   Temp: 97.8 F (36.6 C) 97.6 F (36.4 C) 97.7 F (36.5 C) 97.6 F (36.4 C)  TempSrc: Oral Oral Oral   SpO2: 99% 99% 100% 99%  Weight:   (!) 143.1 kg     Intake/Output Summary (Last 24 hours) at 09/09/2022 1155 Last data filed at 09/09/2022 0900 Gross per 24 hour  Intake 460 ml  Output --  Net 460 ml   Filed Weights    09/08/22 0036 09/09/22 0434  Weight: (!) 141.1 kg (!) 143.1 kg    Examination:  Physical Exam Constitutional:      General: He is not in acute distress.    Appearance: He is obese. He is not toxic-appearing.  Cardiovascular:     Rate and Rhythm: Regular rhythm.     Heart sounds: Normal heart sounds.  Pulmonary:     Effort: Pulmonary effort is normal. No respiratory distress.     Breath sounds: Normal breath sounds.  Chest:     Chest wall: Tenderness (R sternal border / anterior chest on palpation) present.  Abdominal:     Palpations: Abdomen is soft.  Musculoskeletal:     Right lower leg: Edema (trace) present.     Left lower leg: Edema (trace) present.  Skin:    General: Skin is warm and dry.  Neurological:     General: No focal deficit present.     Mental Status: He is alert and oriented to person, place, and time.  Psychiatric:        Mood and Affect: Mood normal.        Behavior: Behavior normal.          Scheduled Medications:   aspirin EC  81 mg Oral Daily   atorvastatin  40 mg Oral q1800   diltiazem  30 mg Oral Q6H   DULoxetine  120 mg Oral Daily   enoxaparin (LOVENOX) injection  0.5 mg/kg Subcutaneous Q24H   insulin aspart  0-5 Units Subcutaneous QHS   insulin aspart  0-9 Units Subcutaneous TID WC   isosorbide mononitrate  30 mg Oral Daily   metoprolol tartrate  100 mg Oral BID   mometasone-formoterol  2 puff Inhalation BID   pantoprazole  40 mg Oral BID   sodium chloride flush  3 mL Intravenous Q12H   ticagrelor  90 mg Oral BID    Continuous Infusions:  sodium chloride     cefTRIAXone (ROCEPHIN)  IV Stopped (09/08/22 2153)    PRN Medications:  sodium chloride, acetaminophen, albuterol, cyclobenzaprine, dextromethorphan-guaiFENesin, hydrALAZINE, HYDROmorphone (DILAUDID) injection, nitroGLYCERIN, ondansetron (ZOFRAN) IV, oxyCODONE-acetaminophen, sodium chloride flush, traZODone  Antimicrobials from admission:  Anti-infectives (From admission,  onward)    Start     Dose/Rate Route Frequency Ordered Stop   09/08/22 0900  cefTRIAXone (ROCEPHIN) 1 g in sodium chloride 0.9 % 100 mL IVPB        1 g 200 mL/hr over 30 Minutes Intravenous Every 24 hours 09/08/22 0849             Data Reviewed:  I have personally reviewed the following...  CBC: Recent Labs  Lab 09/07/22 0827 09/07/22 1622 09/07/22 2126 09/08/22 0453  WBC 9.4 9.9 8.7 8.4  HGB 13.4 13.1 12.4* 12.8*  HCT 41.8 39.6 37.7* 39.0  MCV 83.8 84.1 84.3 84.6  PLT 223 231 220 218  Basic Metabolic Panel: Recent Labs  Lab 09/07/22 0827 09/08/22 0453  NA 134* 136  K 3.6 4.1  CL 99 102  CO2 23 23  GLUCOSE 308* 214*  BUN 19 15  CREATININE 1.27* 1.23  CALCIUM 8.5* 8.6*    GFR: Estimated Creatinine Clearance: 92.1 mL/min (by C-G formula based on SCr of 1.23 mg/dL). Liver Function Tests: Recent Labs  Lab 09/07/22 0827  AST 21  ALT 18  ALKPHOS 125  BILITOT 0.7  PROT 7.3  ALBUMIN 3.1*    Recent Labs  Lab 09/07/22 0827  LIPASE 22    No results for input(s): "AMMONIA" in the last 168 hours. Coagulation Profile: Recent Labs  Lab 09/07/22 1308  INR 1.1    Cardiac Enzymes: Recent Labs  Lab 09/07/22 0827  CKTOTAL 38*    BNP (last 3 results) No results for input(s): "PROBNP" in the last 8760 hours. HbA1C: Recent Labs    09/07/22 1308  HGBA1C 11.7*    CBG: Recent Labs  Lab 09/08/22 0805 09/08/22 0921 09/08/22 1621 09/08/22 2244 09/09/22 0814  GLUCAP 187* 206* 265* 232* 203*    Lipid Profile: Recent Labs    09/08/22 0453  CHOL 109  HDL 31*  LDLCALC 50  TRIG 409  CHOLHDL 3.5    Thyroid Function Tests: No results for input(s): "TSH", "T4TOTAL", "FREET4", "T3FREE", "THYROIDAB" in the last 72 hours. Anemia Panel: No results for input(s): "VITAMINB12", "FOLATE", "FERRITIN", "TIBC", "IRON", "RETICCTPCT" in the last 72 hours. Most Recent Urinalysis On File:     Component Value Date/Time   COLORURINE STRAW (A)  09/07/2022 1308   APPEARANCEUR HAZY (A) 09/07/2022 1308   APPEARANCEUR Clear 12/08/2017 1156   LABSPEC >1.046 (H) 09/07/2022 1308   PHURINE 5.0 09/07/2022 1308   GLUCOSEU >=500 (A) 09/07/2022 1308   HGBUR SMALL (A) 09/07/2022 1308   BILIRUBINUR NEGATIVE 09/07/2022 1308   BILIRUBINUR negative 12/08/2017 1217   BILIRUBINUR Negative 12/08/2017 1156   KETONESUR NEGATIVE 09/07/2022 1308   PROTEINUR NEGATIVE 09/07/2022 1308   UROBILINOGEN 0.2 12/08/2017 1217   NITRITE NEGATIVE 09/07/2022 1308   LEUKOCYTESUR LARGE (A) 09/07/2022 1308   Sepsis Labs: (procalcitonin:4,lacticidven:4) Microbiology: No results found for this or any previous visit (from the past 240 hour(s)).    Radiology Studies last 3 days: ECHOCARDIOGRAM COMPLETE  Result Date: 09/09/2022    ECHOCARDIOGRAM REPORT   Patient Name:   DEONDRA WIGGER [facility] Date of Exam: 09/08/2022 Medical Rec #:  [id number]       Height:       68.0 in Accession #:    [id number]      Weight:       311.1 lb Date of Birth:  04-May-1966       BSA:          2.466 m Patient Age:    57 years        BP:           116/68 mmHg Patient Gender: M               HR:           122 bpm. Exam Location:  [facility] Procedure: 2D Echo, Cardiac Doppler, Color Doppler and Intracardiac            Opacification Agent Indications:     R07.9 Chest pain  History:         Patient has no prior history of Echocardiogram examinations.  CAD; Risk Factors:Hypertension, Diabetes and Dyslipidemia.                  Obstructive sleep apnea-CPAP. Pulmonary hypertension.                  Nonischemic cardiomyopathy.  Sonographer:     Sedonia Small Rodgers-Jones RDCS Referring Phys:  [id number] CHRISTOPHER END Diagnosing Phys: Julien Nordmann MD  Sonographer Comments: Technically difficult study due to poor echo windows and patient is obese. IMPRESSIONS  1. Left ventricular ejection fraction, by estimation, is 60 to 65%. The left ventricle has normal function. The left ventricle has no regional  wall motion abnormalities. There is mild left ventricular hypertrophy. Left ventricular diastolic parameters are consistent with Grade I diastolic dysfunction (impaired relaxation).  2. Right ventricular systolic function is normal. The right ventricular size is normal.  3. The mitral valve is normal in structure. No evidence of mitral valve regurgitation. No evidence of mitral stenosis.  4. The aortic valve has an indeterminant number of cusps. Aortic valve regurgitation is not visualized. Aortic valve sclerosis/calcification is present, without any evidence of aortic stenosis.  5. The inferior vena cava is normal in size with greater than 50% respiratory variability, suggesting right atrial pressure of 3 mmHg. FINDINGS  Left Ventricle: Left ventricular ejection fraction, by estimation, is 60 to 65%. The left ventricle has normal function. The left ventricle has no regional wall motion abnormalities. Definity contrast agent was given IV to delineate the left ventricular  endocardial borders. The left ventricular internal cavity size was normal in size. There is mild left ventricular hypertrophy. Left ventricular diastolic parameters are consistent with Grade I diastolic dysfunction (impaired relaxation). Right Ventricle: The right ventricular size is normal. No increase in right ventricular wall thickness. Right ventricular systolic function is normal. Left Atrium: Left atrial size was normal in size. Right Atrium: Right atrial size was normal in size. Pericardium: There is no evidence of pericardial effusion. Mitral Valve: The mitral valve is normal in structure. No evidence of mitral valve regurgitation. No evidence of mitral valve stenosis. Tricuspid Valve: The tricuspid valve is normal in structure. Tricuspid valve regurgitation is not demonstrated. No evidence of tricuspid stenosis. Aortic Valve: The aortic valve has an indeterminant number of cusps. Aortic valve regurgitation is not visualized. Aortic valve  sclerosis/calcification is present, without any evidence of aortic stenosis. Pulmonic Valve: The pulmonic valve was normal in structure. Pulmonic valve regurgitation is not visualized. No evidence of pulmonic stenosis. Aorta: The aortic root is normal in size and structure. Venous: The inferior vena cava is normal in size with greater than 50% respiratory variability, suggesting right atrial pressure of 3 mmHg. IAS/Shunts: No atrial level shunt detected by color flow Doppler.  LEFT VENTRICLE PLAX 2D LVIDd:         3.80 cm   Diastology LVIDs:         2.70 cm   LV e' medial:    6.79 cm/s LV PW:         1.30 cm   LV E/e' medial:  9.8 LV IVS:        1.30 cm   LV e' lateral:   6.94 cm/s LVOT diam:     2.00 cm   LV E/e' lateral: 9.6 LVOT Area:     3.14 cm  RIGHT VENTRICLE RV Basal diam:  4.10 cm RV S prime:     19.35 cm/s TAPSE (M-mode): 1.7 cm LEFT ATRIUM         Index  RIGHT ATRIUM          Index LA diam:    3.60 cm 1.46 cm/m  RA Area:     8.41 cm                                 RA Volume:   18.10 ml 7.34 ml/m   AORTA Ao Root diam: 3.10 cm Ao Asc diam:  3.10 cm MITRAL VALVE MV Area (PHT): 5.90 cm    SHUNTS MV Decel Time: 129 msec    Systemic Diam: 2.00 cm MV E velocity: 66.70 cm/s MV A velocity: 87.40 cm/s MV E/A ratio:  0.76 Julien Nordmann MD Electronically signed by Julien Nordmann MD Signature Date/Time: 09/09/2022/7:49:50 AM    Final    CARDIAC CATHETERIZATION  Result Date: 09/08/2022 Conclusions: Mild-moderate, non-obstructive coronary artery disease.  No culprit lesion identified for the patient's chest pain. Normal left ventricular filling pressure. Severe left ventricular outflow tract gradient. Recommendations: Add diltiazem for improved heart rate control. Gentle post-catheterization hydration. Obtain echocardiogram and review last year's cardiac MRI with Dr. Azucena Cecil.  May need to consider TEE for further evaluation of LVOT gradient, as I suspect this is driving Mr. Vanhouten symptoms.  Aggressive secondary prevention of coronary artery disease.  Ticagrelor could be stopped tomorrow, at it will have been 12 months since PCI to ostial/proximal LAD. Yvonne Kendall, MD [facility]  CT ABDOMEN PELVIS W CONTRAST  Result Date: 09/07/2022 CLINICAL DATA:  57 year old male with chest pain. Shortness of breath. Lower abdominal pain. EXAM: CT ABDOMEN AND PELVIS WITH CONTRAST TECHNIQUE: Multidetector CT imaging of the abdomen and pelvis was performed using the standard protocol following bolus administration of intravenous contrast. RADIATION DOSE REDUCTION: This exam was performed according to the departmental dose-optimization program which includes automated exposure control, adjustment of the mA and/or kV according to patient size and/or use of iterative reconstruction technique. CONTRAST:  OMNIPAQUE IOHEXOL 350 MG/ML SOLN COMPARISON:  CTA chest today reported separately. CT Abdomen and Pelvis 04/26/2018. FINDINGS: Lower chest: CTA chest is reported separately today. Hepatobiliary: Negative liver and gallbladder. Pancreas: Negative. Spleen: Negative. Adrenals/Urinary Tract: Normal adrenal glands. Stable and negative kidneys. No nephrolithiasis. Decompressed, symmetric ureters. Unremarkable bladder. Stomach/Bowel: Diverticulosis of the descending and sigmoid colon with no active inflammation. Mostly decompressed large bowel, mild retained stool. Negative terminal ileum. Normal appendix coronal image 48. No dilated small bowel. Decompressed stomach and duodenum. No free air, free fluid, or mesenteric inflammation identified. Vascular/Lymphatic: Suboptimal intravascular contrast timing but the major arterial structures appear patent and normal. Minimal calcified iliac artery atherosclerosis. Portal venous system appears to be patent. No lymphadenopathy. Reproductive: Negative. Other: No pelvis free fluid.  Incidental pelvic phleboliths. Musculoskeletal: Lower thoracic through upper lumbar  chronic interbody ankylosis from bulky flowing endplate osteophytes. This has mildly progressed since 2019. superimposed lower lumbar degeneration. No acute osseous abnormality identified. SI joints remain patent. IMPRESSION: 1. No acute or inflammatory process identified in the abdomen or pelvis. Normal appendix. Diverticulosis of the distal colon without active inflammation. 2. CTA Chest reported separately. 3. Chronic thoracolumbar spinal ankylosis, Diffuse idiopathic skeletal hyperostosis (DISH). Electronically Signed   By: Odessa Fleming M.D.   On: 09/07/2022 10:43   CT Angio Chest PE W and/or Wo Contrast  Result Date: 09/07/2022 CLINICAL DATA:  57 year old male with chest pain. Shortness of breath. Lower abdominal pain. EXAM: CT ANGIOGRAPHY CHEST WITH CONTRAST TECHNIQUE: Multidetector CT imaging of the chest was  performed using the standard protocol during bolus administration of intravenous contrast. Multiplanar CT image reconstructions and MIPs were obtained to evaluate the vascular anatomy. RADIATION DOSE REDUCTION: This exam was performed according to the departmental dose-optimization program which includes automated exposure control, adjustment of the mA and/or kV according to patient size and/or use of iterative reconstruction technique. CONTRAST:  OMNIPAQUE IOHEXOL 350 MG/ML SOLN COMPARISON:  Chest radiographs 0818 hours today. CT Abdomen and Pelvis reported separately. Previous chest CTA 09/16/2021. FINDINGS: Cardiovascular: Adequate contrast bolus timing in the pulmonary arterial tree. Mild respiratory motion. No central or lobar pulmonary artery filling defect. No convincing segmental or distal branch pulmonary artery thrombosis. Stable heart size, no cardiomegaly. Incidental mediastinal lipomatosis. No pericardial effusion. Thoracic aorta appears stable and negative. There is chronic left coronary artery stent. Mediastinum/Nodes: Negative. No mediastinal mass or lymphadenopathy. Lungs/Pleura:  Stable lung volumes and mild atelectatic changes to the major airways which remain patent. No pleural effusion or consolidation. No suspicious pulmonary nodule or convincing pulmonary inflammation. Mild chronic scarring/atelectasis in the lingula and right middle lobe. Upper Abdomen: CT Abdomen and Pelvis reported separately today. Musculoskeletal: Widespread chronic thoracic spine interbody ankylosis from flowing endplate osteophytes, Diffuse idiopathic skeletal hyperostosis (DISH). T1-T2 spared as before. No acute osseous abnormality identified. Review of the MIP images confirms the above findings. IMPRESSION: 1. Mildly degraded by respiratory motion. No pulmonary embolus identified. 2. No acute or inflammatory process identified in the Chest. 3. CT Abdomen and Pelvis reported separately. 4. Thoracic spinal ankylosis, Diffuse idiopathic skeletal hyperostosis (DISH). Electronically Signed   By: Odessa Fleming M.D.   On: 09/07/2022 10:38   US Venous Img Lower Bilateral  Result Date: 09/07/2022 CLINICAL DATA:  Lower extremity pain for 2-3 weeks EXAM: BILATERAL LOWER EXTREMITY VENOUS DOPPLER ULTRASOUND TECHNIQUE: Gray-scale sonography with graded compression, as well as color Doppler and duplex ultrasound were performed to evaluate the lower extremity deep venous systems from the level of the common femoral vein and including the common femoral, femoral, profunda femoral, popliteal and calf veins including the posterior tibial, peroneal and gastrocnemius veins when visible. The superficial great saphenous vein was also interrogated. Spectral Doppler was utilized to evaluate flow at rest and with distal augmentation maneuvers in the common femoral, femoral and popliteal veins. COMPARISON:  None Available. FINDINGS: RIGHT LOWER EXTREMITY Common Femoral Vein: No evidence of thrombus. Normal compressibility, respiratory phasicity and response to augmentation. Saphenofemoral Junction: No evidence of thrombus. Normal  compressibility and flow on color Doppler imaging. Profunda Femoral Vein: No evidence of thrombus. Normal compressibility and flow on color Doppler imaging. Femoral Vein: No evidence of thrombus. Normal compressibility, respiratory phasicity and response to augmentation. Popliteal Vein: No evidence of thrombus. Normal compressibility, respiratory phasicity and response to augmentation. Calf Veins: No evidence of thrombus. Normal compressibility and flow on color Doppler imaging. Superficial Great Saphenous Vein: No evidence of thrombus. Normal compressibility. Venous Reflux:  None. Other Findings:  None. LEFT LOWER EXTREMITY Common Femoral Vein: No evidence of thrombus. Normal compressibility, respiratory phasicity and response to augmentation. Saphenofemoral Junction: No evidence of thrombus. Normal compressibility and flow on color Doppler imaging. Profunda Femoral Vein: No evidence of thrombus. Normal compressibility and flow on color Doppler imaging. Femoral Vein: No evidence of thrombus. Normal compressibility, respiratory phasicity and response to augmentation. Popliteal Vein: No evidence of thrombus. Normal compressibility, respiratory phasicity and response to augmentation. Calf Veins: No evidence of thrombus. Normal compressibility and flow on color Doppler imaging. Superficial Great Saphenous Vein: No evidence of thrombus. Normal  compressibility. Venous Reflux:  None. Other Findings:  None. IMPRESSION: No evidence of deep venous thrombosis in either lower extremity. Electronically Signed   By: Alcide Clever M.D.   On: 09/07/2022 10:01   DG Chest 2 View  Result Date: 09/07/2022 CLINICAL DATA:  Chest pain EXAM: CHEST - 2 VIEW COMPARISON:  07/03/2022 FINDINGS: The heart size and mediastinal contours are within normal limits. Both lungs are clear. The visualized skeletal structures are unremarkable except for degenerative changes throughout the spine. IMPRESSION: No active cardiopulmonary disease.  Electronically Signed   By: Judie Petit.  Shick M.D.   On: 09/07/2022 08:34             LOS: 1 day      Silvano Bilis, MD [facility] 09/09/2022, 11:55 AM    Staff may message me via secure chat in Epic  but this may not receive an immediate response,  please page me for urgent matters!  If 7PM-7AM, please contact night coverage [email or web address]

## 2022-09-09 NOTE — Progress Notes (Signed)
Cardiology Progress Note   Patient Name: Brett Marshall Date of Encounter: 09/09/2022  Primary Cardiologist: Lorine Bears, MD  Subjective   C/o constant 5/10 left chest pain.  As previously noted, he has a second, more exertional chest pain and dyspnea that occurs w/ minimal activity such as ambulating to the bathroom.  R wrist is mildly tender but otw looks good.  Wife at bedside.  Long discussion about role of TEE in working up possible subaortic membrane.  Inpatient Medications    Scheduled Meds:  aspirin EC  81 mg Oral Daily   atorvastatin  40 mg Oral q1800   diltiazem  30 mg Oral Q6H   DULoxetine  120 mg Oral Daily   enoxaparin (LOVENOX) injection  0.5 mg/kg Subcutaneous Q24H   insulin aspart  0-5 Units Subcutaneous QHS   insulin aspart  0-9 Units Subcutaneous TID WC   isosorbide mononitrate  30 mg Oral Daily   metoprolol tartrate  100 mg Oral BID   mometasone-formoterol  2 puff Inhalation BID   pantoprazole  40 mg Oral BID   sodium chloride flush  3 mL Intravenous Q12H   ticagrelor  90 mg Oral BID   Continuous Infusions:  sodium chloride     cefTRIAXone (ROCEPHIN)  IV Stopped (09/08/22 2153)   PRN Meds: sodium chloride, acetaminophen, albuterol, cyclobenzaprine, dextromethorphan-guaiFENesin, hydrALAZINE, HYDROmorphone (DILAUDID) injection, nitroGLYCERIN, ondansetron (ZOFRAN) IV, oxyCODONE-acetaminophen, sodium chloride flush, traZODone   Vital Signs    Vitals:   09/08/22 2248 09/09/22 0044 09/09/22 0434 09/09/22 0813  BP: 107/66 105/79 111/83 (!) 120/90  Pulse: (!) 107 92 82 70  Resp: Temp: 97.8 F (36.6 C) 97.6 F (36.4 C) 97.7 F (36.5 C) 97.6 F (36.4 C)  TempSrc: Oral Oral Oral   SpO2: 99% 99% 100% 99%  Weight:   (!) 143.1 kg     Intake/Output Summary (Last 24 hours) at 09/09/2022 1014 Last data filed at 09/09/2022 0900 Gross per 24 hour  Intake 460 ml  Output --  Net 460 ml   Filed Weights   09/08/22 0036 09/09/22 0434  Weight:  (!) 141.1 kg (!) 143.1 kg    Physical Exam   GEN: Obese, in no acute distress.  HEENT: Grossly normal.  Neck: Supple, obese, difficult to gauge JVP.  No bruits. Cardiac: RRR, 2/6 systolic ejection murmur at the upper sternal borders.  No rubs or gallops. No clubbing, cyanosis, edema.  Radials 2+, DP/PT 2+ and equal bilaterally.  Right radial cath site without bleeding, bruit, or hematoma. Respiratory:  Respirations regular and unlabored, clear to auscultation bilaterally. GI: Soft, nontender, nondistended, BS + x 4. MS: no deformity or atrophy. Skin: warm and dry, no rash. Neuro:  Strength and sensation are intact. Psych: AAOx3.  Normal affect.  Labs    Chemistry Recent Labs  Lab 09/07/22 0827 09/08/22 0453  NA 134* 136  K 3.6 4.1  CL 99 102  CO2 23 23  GLUCOSE 308* 214*  BUN 19 15  CREATININE 1.27* 1.23  CALCIUM 8.5* 8.6*  PROT 7.3  --   ALBUMIN 3.1*  --   AST 21  --   ALT 18  --   ALKPHOS 125  --   BILITOT 0.7  --   GFRNONAA >60 >60  ANIONGAP 12 11     Hematology Recent Labs  Lab 09/07/22 1622 09/07/22 2126 09/08/22 0453  WBC 9.9 8.7 8.4  RBC 4.71 4.47 4.61  HGB 13.1 12.4* 12.8*  HCT 39.6 37.7* 39.0  MCV 84.1 84.3 84.6  MCH 27.8 27.7 27.8  MCHC 33.1 32.9 32.8  RDW 14.0 14.1 14.1  PLT 231 220 218    Cardiac Enzymes  Recent Labs  Lab 09/07/22 0827 09/07/22 1015 09/07/22 1308 09/07/22 1622 09/07/22 2126  TROPONINIHS 6 5 5 4 5       BNP    Component Value Date/Time   BNP 39.4 09/07/2022 0827    Lipids  Lab Results  Component Value Date   CHOL 109 09/08/2022   HDL 31 (L) 09/08/2022   LDLCALC 50 09/08/2022   TRIG 138 09/08/2022   CHOLHDL 3.5 09/08/2022    HbA1c  Lab Results  Component Value Date   HGBA1C 11.7 (H) 09/07/2022    Radiology    CT ABDOMEN PELVIS W CONTRAST  Result Date: 09/07/2022 CLINICAL DATA:  57 year old male with chest pain. Shortness of breath. Lower abdominal pain. EXAM: CT ABDOMEN AND PELVIS WITH CONTRAST  TECHNIQUE: Multidetector CT imaging of the abdomen and pelvis was performed using the standard protocol following bolus administration of intravenous contrast. RADIATION DOSE REDUCTION: This exam was performed according to the departmental dose-optimization program which includes automated exposure control, adjustment of the mA and/or kV according to patient size and/or use of iterative reconstruction technique. CONTRAST:  OMNIPAQUE IOHEXOL 350 MG/ML SOLN COMPARISON:  CTA chest today reported separately. CT Abdomen and Pelvis 04/26/2018. FINDINGS: Lower chest: CTA chest is reported separately today. Hepatobiliary: Negative liver and gallbladder. Pancreas: Negative. Spleen: Negative. Adrenals/Urinary Tract: Normal adrenal glands. Stable and negative kidneys. No nephrolithiasis. Decompressed, symmetric ureters. Unremarkable bladder. Stomach/Bowel: Diverticulosis of the descending and sigmoid colon with no active inflammation. Mostly decompressed large bowel, mild retained stool. Negative terminal ileum. Normal appendix coronal image 48. No dilated small bowel. Decompressed stomach and duodenum. No free air, free fluid, or mesenteric inflammation identified. Vascular/Lymphatic: Suboptimal intravascular contrast timing but the major arterial structures appear patent and normal. Minimal calcified iliac artery atherosclerosis. Portal venous system appears to be patent. No lymphadenopathy. Reproductive: Negative. Other: No pelvis free fluid.  Incidental pelvic phleboliths. Musculoskeletal: Lower thoracic through upper lumbar chronic interbody ankylosis from bulky flowing endplate osteophytes. This has mildly progressed since 2019. superimposed lower lumbar degeneration. No acute osseous abnormality identified. SI joints remain patent. IMPRESSION: 1. No acute or inflammatory process identified in the abdomen or pelvis. Normal appendix. Diverticulosis of the distal colon without active inflammation. 2. CTA Chest  reported separately. 3. Chronic thoracolumbar spinal ankylosis, Diffuse idiopathic skeletal hyperostosis (DISH). Electronically Signed   By: Odessa Fleming M.D.   On: 09/07/2022 10:43   CT Angio Chest PE W and/or Wo Contrast  Result Date: 09/07/2022 CLINICAL DATA:  57 year old male with chest pain. Shortness of breath. Lower abdominal pain. EXAM: CT ANGIOGRAPHY CHEST WITH CONTRAST TECHNIQUE: Multidetector CT imaging of the chest was performed using the standard protocol during bolus administration of intravenous contrast. Multiplanar CT image reconstructions and MIPs were obtained to evaluate the vascular anatomy. RADIATION DOSE REDUCTION: This exam was performed according to the departmental dose-optimization program which includes automated exposure control, adjustment of the mA and/or kV according to patient size and/or use of iterative reconstruction technique. CONTRAST:  OMNIPAQUE IOHEXOL 350 MG/ML SOLN COMPARISON:  Chest radiographs 0818 hours today. CT Abdomen and Pelvis reported separately. Previous chest CTA 09/16/2021. FINDINGS: Cardiovascular: Adequate contrast bolus timing in the pulmonary arterial tree. Mild respiratory motion. No central or lobar pulmonary artery filling defect. No convincing segmental or distal branch pulmonary artery thrombosis.  Stable heart size, no cardiomegaly. Incidental mediastinal lipomatosis. No pericardial effusion. Thoracic aorta appears stable and negative. There is chronic left coronary artery stent. Mediastinum/Nodes: Negative. No mediastinal mass or lymphadenopathy. Lungs/Pleura: Stable lung volumes and mild atelectatic changes to the major airways which remain patent. No pleural effusion or consolidation. No suspicious pulmonary nodule or convincing pulmonary inflammation. Mild chronic scarring/atelectasis in the lingula and right middle lobe. Upper Abdomen: CT Abdomen and Pelvis reported separately today. Musculoskeletal: Widespread chronic thoracic spine interbody  ankylosis from flowing endplate osteophytes, Diffuse idiopathic skeletal hyperostosis (DISH). T1-T2 spared as before. No acute osseous abnormality identified. Review of the MIP images confirms the above findings. IMPRESSION: 1. Mildly degraded by respiratory motion. No pulmonary embolus identified. 2. No acute or inflammatory process identified in the Chest. 3. CT Abdomen and Pelvis reported separately. 4. Thoracic spinal ankylosis, Diffuse idiopathic skeletal hyperostosis (DISH). Electronically Signed   By: Odessa Fleming M.D.   On: 09/07/2022 10:38   US Venous Img Lower Bilateral  Result Date: 09/07/2022 CLINICAL DATA:  Lower extremity pain for 2-3 weeks EXAM: BILATERAL LOWER EXTREMITY VENOUS DOPPLER ULTRASOUND TECHNIQUE: Gray-scale sonography with graded compression, as well as color Doppler and duplex ultrasound were performed to evaluate the lower extremity deep venous systems from the level of the common femoral vein and including the common femoral, femoral, profunda femoral, popliteal and calf veins including the posterior tibial, peroneal and gastrocnemius veins when visible. The superficial great saphenous vein was also interrogated. Spectral Doppler was utilized to evaluate flow at rest and with distal augmentation maneuvers in the common femoral, femoral and popliteal veins. COMPARISON:  None Available. FINDINGS: RIGHT LOWER EXTREMITY Common Femoral Vein: No evidence of thrombus. Normal compressibility, respiratory phasicity and response to augmentation. Saphenofemoral Junction: No evidence of thrombus. Normal compressibility and flow on color Doppler imaging. Profunda Femoral Vein: No evidence of thrombus. Normal compressibility and flow on color Doppler imaging. Femoral Vein: No evidence of thrombus. Normal compressibility, respiratory phasicity and response to augmentation. Popliteal Vein: No evidence of thrombus. Normal compressibility, respiratory phasicity and response to augmentation. Calf Veins: No  evidence of thrombus. Normal compressibility and flow on color Doppler imaging. Superficial Great Saphenous Vein: No evidence of thrombus. Normal compressibility. Venous Reflux:  None. Other Findings:  None. LEFT LOWER EXTREMITY Common Femoral Vein: No evidence of thrombus. Normal compressibility, respiratory phasicity and response to augmentation. Saphenofemoral Junction: No evidence of thrombus. Normal compressibility and flow on color Doppler imaging. Profunda Femoral Vein: No evidence of thrombus. Normal compressibility and flow on color Doppler imaging. Femoral Vein: No evidence of thrombus. Normal compressibility, respiratory phasicity and response to augmentation. Popliteal Vein: No evidence of thrombus. Normal compressibility, respiratory phasicity and response to augmentation. Calf Veins: No evidence of thrombus. Normal compressibility and flow on color Doppler imaging. Superficial Great Saphenous Vein: No evidence of thrombus. Normal compressibility. Venous Reflux:  None. Other Findings:  None. IMPRESSION: No evidence of deep venous thrombosis in either lower extremity. Electronically Signed   By: Alcide Clever M.D.   On: 09/07/2022 10:01   DG Chest 2 View  Result Date: 09/07/2022 CLINICAL DATA:  Chest pain EXAM: CHEST - 2 VIEW COMPARISON:  07/03/2022 FINDINGS: The heart size and mediastinal contours are within normal limits. Both lungs are clear. The visualized skeletal structures are unremarkable except for degenerative changes throughout the spine. IMPRESSION: No active cardiopulmonary disease. Electronically Signed   By: Judie Petit.  Shick M.D.   On: 09/07/2022 08:34    Telemetry    Regular sinus rhythm  in the 80s to 90s to sinus tach in the low 100s yesterday- Personally Reviewed  Cardiac Studies   Cardiac Catheterization  4.23.2024  Left Main  Vessel is angiographically normal.    Left Anterior Descending  Non-stenotic Ost LAD lesion was previously treated.  Mid LAD lesion is 20% stenosed.   Dist LAD lesion is 60% stenosed.    Second Diagonal Branch  There is mild disease in the vessel.    Ramus Intermedius  The vessel exhibits minimal luminal irregularities.    Left Circumflex  The vessel exhibits minimal luminal irregularities.    Right Coronary Artery  Vessel is small.  Prox RCA lesion is 50% stenosed.     Left Ventricle LV end diastolic pressure is normal. LVEDP 10 mmHg.  Aortic Valve There is a severe LVOT gradient (peak-to-peak gradient 67 mmHg); it unclear if this is due to aortic valve stenosis or subaortic membrane.   2D Echocardiogram 4.23.2024  1. Left ventricular ejection fraction, by estimation, is 60 to 65%. The  left ventricle has normal function. The left ventricle has no regional  wall motion abnormalities. There is mild left ventricular hypertrophy.  Left ventricular diastolic parameters  are consistent with Grade I diastolic dysfunction (impaired relaxation).   2. Right ventricular systolic function is normal. The right ventricular  size is normal.   3. The mitral valve is normal in structure. No evidence of mitral valve  regurgitation. No evidence of mitral stenosis.   4. The aortic valve has an indeterminant number of cusps. Aortic valve  regurgitation is not visualized. Aortic valve sclerosis/calcification is  present, without any evidence of aortic stenosis.   5. The inferior vena cava is normal in size with greater than 50%  respiratory variability, suggesting right atrial pressure of 3 mmHg.  _____________   Patient Profile     57 y.o. 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   Assessment & Plan    1.  Accelerating angina/CAD: Patient presented with 2 distinct types of chest pain-1 that is constant, and 1 that is  exertional and associated with dyspnea, similar to his previous stent symptoms last year.  Echo this admission showed normal LV function no imaging was performed.  Diagnostic catheterization April 23 showed stable coronary anatomy with 6% distal LAD stenosis patent ostial LAD stent.  He was once again noted to have an LVOT gradient of 67 mm peak to peak (see below).  Plan for TEE tomorrow to further evaluate LVOT gradient.  Continue medical therapy for coronary artery disease including aspirin, statin, beta-blocker, and Brilinta.  As we have reached the 1 year mark since his PCI to the LAD, we will consider reducing his Brilinta to 60 mg twice daily.  2.  Subaortic Membrane/LVOT gradient: As noted above, patient with 67 mmHg peak to peak LVOT gradient during catheterization yesterday.  Similar symptoms last year with cardiac MRI unremarkable for hypertrophic cardiomyopathy.  In the setting of ongoing gradient, cardiac MRI was reevaluated by our noninvasive team today and the report was updated to indicate presence of a subaortic membrane.  Discussed with patient and wife in detail today.  Will plan on transesophageal echocardiogram to better evaluate in the a.m.  Anesthesia requested for case given body habitus and history of sleep apnea.  Oral diltiazem was added to his  regimen in the setting of elevated heart rates.  Continue beta-blocker.  Consider discontinuation of nitrate therapy.  3.  Chronic HFimpEF/ICM: EF 60 to 65% by echo on April 23.  EDP was normal at 10 mmHg.  Body habitus makes exam challenging.  Continue current medical regimen.  4.  HL: LDL of 50.  Continue statin therapy.  5.  DMII: A1c 11.7-poorly controlled.  Per medicine team.  Would likely benefit from GLP-1 agonist.  6.  OSA: Compliant with CPAP.  7.  Morbid Obesity: As above, would likely benefit from GLP-1 agonist.  Signed, Nicolasa Ducking, NP  09/09/2022, 10:14 AM    For questions or updates, please contact   Please  consult [email or web address] for contact info under Cardiology/STEMI.

## 2022-09-10 ENCOUNTER — Inpatient Hospital Stay: Payer: BLUE CROSS/BLUE SHIELD | Admitting: Anesthesiology

## 2022-09-10 ENCOUNTER — Inpatient Hospital Stay (HOSPITAL_COMMUNITY)
Admit: 2022-09-10 | Discharge: 2022-09-10 | Disposition: A | Discharge status: Home / Self Care | Payer: BLUE CROSS/BLUE SHIELD | Attending: Nurse Practitioner | Admitting: Nurse Practitioner

## 2022-09-10 ENCOUNTER — Encounter
Admission: EM | Disposition: A | Discharge status: Home / Self Care | Payer: Self-pay | Source: Home / Self Care | Attending: Obstetrics and Gynecology

## 2022-09-10 ENCOUNTER — Inpatient Hospital Stay: Admit: 2022-09-10 | Payer: BLUE CROSS/BLUE SHIELD

## 2022-09-10 DIAGNOSIS — G4733 Obstructive sleep apnea (adult) (pediatric): Secondary | ICD-10-CM | POA: Diagnosis not present

## 2022-09-10 DIAGNOSIS — Q248 Other specified congenital malformations of heart: Secondary | ICD-10-CM | POA: Diagnosis not present

## 2022-09-10 DIAGNOSIS — R079 Chest pain, unspecified: Secondary | ICD-10-CM | POA: Diagnosis not present

## 2022-09-10 DIAGNOSIS — E119 Type 2 diabetes mellitus without complications: Secondary | ICD-10-CM | POA: Diagnosis not present

## 2022-09-10 DIAGNOSIS — I350 Nonrheumatic aortic (valve) stenosis: Secondary | ICD-10-CM | POA: Diagnosis not present

## 2022-09-10 DIAGNOSIS — E782 Mixed hyperlipidemia: Secondary | ICD-10-CM

## 2022-09-10 HISTORY — PX: TEE WITHOUT CARDIOVERSION: SHX5443

## 2022-09-10 LAB — CBC
HCT: 38.9 % — ABNORMAL LOW (ref 39.0–52.0)
Hemoglobin: 12.5 g/dL — ABNORMAL LOW (ref 13.0–17.0)
MCH: 27.4 pg (ref 26.0–34.0)
MCHC: 32.1 g/dL (ref 30.0–36.0)
MCV: 85.3 fL (ref 80.0–100.0)
Platelets: 187 10*3/uL (ref 150–400)
RBC: 4.56 MIL/uL (ref 4.22–5.81)
RDW: 14 % (ref 11.5–15.5)
WBC: 7.1 10*3/uL (ref 4.0–10.5)
nRBC: 0 % (ref 0.0–0.2)

## 2022-09-10 LAB — GLUCOSE, CAPILLARY
Glucose-Capillary: 251 mg/dL — ABNORMAL HIGH (ref 70–99)
Glucose-Capillary: 194 mg/dL — ABNORMAL HIGH (ref 70–99)
Glucose-Capillary: 163 mg/dL — ABNORMAL HIGH (ref 70–99)
Glucose-Capillary: 273 mg/dL — ABNORMAL HIGH (ref 70–99)
Glucose-Capillary: 265 mg/dL — ABNORMAL HIGH (ref 70–99)

## 2022-09-10 LAB — URINE CULTURE: Culture: >=100000 — AB

## 2022-09-10 LAB — BASIC METABOLIC PANEL
Anion gap: 8 (ref 5–15)
BUN: 16 mg/dL (ref 6–20)
CO2: 22 mmol/L (ref 22–32)
Calcium: 8.4 mg/dL — ABNORMAL LOW (ref 8.9–10.3)
Chloride: 107 mmol/L (ref 98–111)
Creatinine, Ser: 1.17 mg/dL (ref 0.61–1.24)
GFR, Estimated: >60 mL/min (ref >60)
Glucose, Bld: 324 mg/dL — ABNORMAL HIGH (ref 70–99)
Potassium: 3.9 mmol/L (ref 3.5–5.1)
Sodium: 137 mmol/L (ref 135–145)

## 2022-09-10 LAB — ECHO TEE

## 2022-09-10 SURGERY — ECHOCARDIOGRAM, TRANSESOPHAGEAL
Anesthesia: General

## 2022-09-10 MED ORDER — PROPOFOL 10 MG/ML IV BOLUS
INTRAVENOUS | Status: DC | PRN
  Administered 2022-09-10: 20 mg via INTRAVENOUS
  Administered 2022-09-10 (×4): 10 mg via INTRAVENOUS
  Administered 2022-09-10: 70 mg via INTRAVENOUS
  Administered 2022-09-10 (×3): 10 mg via INTRAVENOUS

## 2022-09-10 MED ORDER — BUTAMBEN-TETRACAINE-BENZOCAINE 2-2-14 % EX AERO: 1.0000 | INHALATION_SPRAY | Freq: Once | CUTANEOUS | Status: AC

## 2022-09-10 MED ORDER — LIDOCAINE VISCOUS HCL 2 % MT SOLN: 15.0000 mL | Freq: Once | OROMUCOSAL | Status: AC

## 2022-09-10 MED ORDER — ISOSORBIDE MONONITRATE ER 30 MG PO TB24
15.0000 mg | ORAL_TABLET | Freq: Every day | ORAL | Status: DC
  Administered 2022-09-11: 15 mg via ORAL
  Filled 2022-09-10: qty 1

## 2022-09-10 MED ORDER — LIDOCAINE VISCOUS HCL 2 % MT SOLN
OROMUCOSAL | Status: AC
  Administered 2022-09-10: 15 mL via OROMUCOSAL
  Filled 2022-09-10: qty 15

## 2022-09-10 MED ORDER — CEPHALEXIN 500 MG PO CAPS
500.0000 mg | ORAL_CAPSULE | Freq: Two times a day (BID) | ORAL | Status: DC
  Administered 2022-09-10 – 2022-09-11 (×2): 500 mg via ORAL
  Filled 2022-09-10 (×2): qty 1

## 2022-09-10 MED ORDER — INSULIN GLARGINE-YFGN 100 UNIT/ML ~~LOC~~ SOLN
8.0000 [IU] | Freq: Once | SUBCUTANEOUS | Status: DC
  Filled 2022-09-10 (×2): qty 0.08

## 2022-09-10 MED ORDER — INSULIN GLARGINE-YFGN 100 UNIT/ML ~~LOC~~ SOLN
18.0000 [IU] | Freq: Every day | SUBCUTANEOUS | Status: DC
  Administered 2022-09-11: 18 [IU] via SUBCUTANEOUS
  Filled 2022-09-10: qty 0.18

## 2022-09-10 MED ORDER — LIDOCAINE HCL 2 % IJ SOLN: 0.1000 mL | Freq: Once | INTRAMUSCULAR | Status: DC

## 2022-09-10 MED ORDER — BUTAMBEN-TETRACAINE-BENZOCAINE 2-2-14 % EX AERO
INHALATION_SPRAY | CUTANEOUS | Status: AC
  Administered 2022-09-10: 1 via TOPICAL
  Filled 2022-09-10: qty 5

## 2022-09-10 NOTE — Progress Notes (Signed)
PROGRESS NOTE    PEYSON POSTEMA   ZHY:[id number] DOB: [birth year]  DOA: 09/07/2022 Date of Service: 09/10/22 PCP: Ethelda Chick, MD     Brief Narrative / Hospital Course:   ASHISH ROSSETTI is a 57 y.o. male with medical history significant of CAD, s/p of stent placement, HTN, HLD, DM, asthma, dCHF, GERD, depression, obstructive hypertrophic cardiomyopathy, NASH, pulmonary hypertension, morbid obesity, OSA on CPAP  HPI: to ED 09/07/22 w/ chest pain, intermittent, substernal, x1 week, exertional. Assoc w/ mild LLQ pain and dark stool.  04/22: in ED troponin 6  --> 5, Hgb stable 13.3 on 07/03/22 --> 13.4 today, GFR> 60, temperature normal, blood pressure 97/69, 116/75, heart rate 101, 88, RR 23, oxygen saturation 97% on room air.  Chest x-ray negative.  CT of abdomen/pelvis negative for acute intra-abdominal issues.  CTA Chest showing motion degraded test, but negative for PE. LE doppler is negative for DVT. Patient is placed on telemetry bed for obs r/o ACS. [facility] cardiology is consulted, recs for [facility] tomorrow.  04/23: UA(+) lg Leuk, WBC>50, meeting sepsis criteria w/ elevated HR and RR, started ceftriaxone pending cultures. LHC showed patent ostial/proximal LAD stent, moderate distal LAD and moderate-severe non-dominant RCA disease and severe resting LVOT gradient (~70 mmHg) --> plan TTE, consider TEE, continue secondary prevention measures.    Consultants:  Cardiology    Procedures: 09/08/22 LEFT HEART CATH AND CORONARY ANGIOGRAPHY (N/A)  4/25 TEE             ASSESSMENT & PLAN:   Principal Problem:   Chest pain Active Problems:   CAD (coronary artery disease)   Essential hypertension   Chronic diastolic CHF (congestive heart failure)   HLD (hyperlipidemia)   Type 2 diabetes mellitus without complication, without long-term current use of insulin   Abdominal pain   Dark stools   Depression   OSA on CPAP   Morbid obesity   Chest pain and hx of CAD concerning for unstable  angina.   Given exam, suspect costochondritis  Trop  6 --> 5.   S/p LHC per cardiology - see above  Initially IV heparin was initially ordered by EDP, but the patient reports dark stool, IV heparin will be held off.   Aspirin, Brilinta and lipitor LHC mild/mod CAD, no culprit lesion CTA neg for PE  Elevated left ventricular outflow gradient TEE non-diagnostic, plan now is outpt f/u, possibly gated ct scan   Complicated UTI Sepsis criteria met w/ tachycardia, tachypnea though may be SIRS d/t chest pain  Likely cause of his abdominal pain given no other findings on CT/exam. Culture with mssa and gbs Ceftriaxone IV > keflex, today is day 3 of abx   Dark stool Monitor CBC, stable   Hypotension Imdur dose decreased, cardiology advises monitoring overnight   Chronic diastolic CHF (congestive heart failure):  2D echo on 09/05/2021 showed EF 60 to 65% with grade 1 diastolic dysfunction.  Patient has trace leg edema, but no JVD.  BNP normal 39.4.  CHF is compensated.    HLD (hyperlipidemia) Lipitor   Type 2 diabetes mellitus without complication, without long-term current use of insulin: Recent A1c 7.5, poorly controlled.   Patient taking metformin, Jardiance and glipizide Sliding scale insulin   OSA on CPAP   Morbid obesity:  Body weight 149.7 kg, BMI 50.18 Encouraged losing weight Exercise and healthy dietNo notes on file          Subjective / Brief ROS:  Mild chest pain persists, no other  complaints  Family Communication: wife is at bedside on rounds     Objective Findings:  Vitals:   09/10/22 1253 09/10/22 1300 09/10/22 1315 09/10/22 1330  BP: 93/66 98/66 101/71 98/66  Pulse: 72 71 71 72  Resp: Temp:      TempSrc:      SpO2: 96% 94% 93% 94%  Weight:        Intake/Output Summary (Last 24 hours) at 09/10/2022 1542 Last data filed at 09/10/2022 1244 Gross per 24 hour  Intake 2161.67 ml  Output --  Net 2161.67 ml    Filed Weights    09/08/22 0036 09/09/22 0434 09/10/22 0326  Weight: (!) 141.1 kg (!) 143.1 kg (!) 144.5 kg    Examination:  Physical Exam Constitutional:      General: He is not in acute distress.    Appearance: He is obese. He is not toxic-appearing.  Cardiovascular:     Rate and Rhythm: Regular rhythm.     Heart sounds: Normal heart sounds.  Pulmonary:     Effort: Pulmonary effort is normal. No respiratory distress.     Breath sounds: Normal breath sounds.  Chest:     Chest wall: Tenderness (R sternal border / anterior chest on palpation) present.  Abdominal:     Palpations: Abdomen is soft.  Musculoskeletal:     Right lower leg: Edema (trace) present.     Left lower leg: Edema (trace) present.  Skin:    General: Skin is warm and dry.  Neurological:     General: No focal deficit present.     Mental Status: He is alert and oriented to person, place, and time.  Psychiatric:        Mood and Affect: Mood normal.        Behavior: Behavior normal.          Scheduled Medications:   aspirin EC  81 mg Oral Daily   atorvastatin  40 mg Oral q1800   diltiazem  30 mg Oral Q6H   DULoxetine  120 mg Oral Daily   enoxaparin (LOVENOX) injection  0.5 mg/kg Subcutaneous Q24H   insulin aspart  0-5 Units Subcutaneous QHS   insulin aspart  0-9 Units Subcutaneous TID WC   [START ON 09/11/2022] insulin glargine-yfgn  18 Units Subcutaneous Daily   insulin glargine-yfgn  8 Units Subcutaneous Once   [START ON 09/11/2022] isosorbide mononitrate  15 mg Oral Daily   metoprolol tartrate  100 mg Oral BID   mometasone-formoterol  2 puff Inhalation BID   pantoprazole  40 mg Oral BID   polyethylene glycol  17 g Oral Daily   sodium chloride flush  3 mL Intravenous Q12H    Continuous Infusions:  sodium chloride     cefTRIAXone (ROCEPHIN)  IV 1 g (09/09/22 1934)    PRN Medications:  sodium chloride, acetaminophen, albuterol, cyclobenzaprine, dextromethorphan-guaiFENesin, hydrALAZINE, nitroGLYCERIN, ondansetron  (ZOFRAN) IV, oxyCODONE-acetaminophen, sodium chloride flush, traZODone  Antimicrobials from admission:  Anti-infectives (From admission, onward)    Start     Dose/Rate Route Frequency Ordered Stop   09/08/22 0900  cefTRIAXone (ROCEPHIN) 1 g in sodium chloride 0.9 % 100 mL IVPB        1 g 200 mL/hr over 30 Minutes Intravenous Every 24 hours 09/08/22 0849             Data Reviewed:  I have personally reviewed the following...  CBC: Recent Labs  Lab 09/07/22 0827 09/07/22 1622 09/07/22 2126 09/08/22 0453  09/10/22 0621  WBC 9.4 9.9 8.7 8.4 7.1  HGB 13.4 13.1 12.4* 12.8* 12.5*  HCT 41.8 39.6 37.7* 39.0 38.9*  MCV 83.8 84.1 84.3 84.6 85.3  PLT 223 231 220 218 187    Basic Metabolic Panel: Recent Labs  Lab 09/07/22 0827 09/08/22 0453 09/10/22 0621  NA 134* 136 137  K 3.6 4.1 3.9  CL 99 102 107  CO2 23 23 22   GLUCOSE 308* 214* 324*  BUN 19 15 16   CREATININE 1.27* 1.23 1.17  CALCIUM 8.5* 8.6* 8.4*    GFR: Estimated Creatinine Clearance: 97.3 mL/min (by C-G formula based on SCr of 1.17 mg/dL). Liver Function Tests: Recent Labs  Lab 09/07/22 0827  AST 21  ALT 18  ALKPHOS 125  BILITOT 0.7  PROT 7.3  ALBUMIN 3.1*    Recent Labs  Lab 09/07/22 0827  LIPASE 22    No results for input(s): "AMMONIA" in the last 168 hours. Coagulation Profile: Recent Labs  Lab 09/07/22 1308  INR 1.1    Cardiac Enzymes: Recent Labs  Lab 09/07/22 0827  CKTOTAL 38*    BNP (last 3 results) No results for input(s): "PROBNP" in the last 8760 hours. HbA1C: No results for input(s): "HGBA1C" in the last 72 hours.  CBG: Recent Labs  Lab 09/09/22 1617 09/09/22 2113 09/10/22 0828 09/10/22 1143 09/10/22 1340  GLUCAP 213* 285* 251* 194* 163*    Lipid Profile: Recent Labs    09/08/22 0453  CHOL 109  HDL 31*  LDLCALC 50  TRIG 161  CHOLHDL 3.5    Thyroid Function Tests: No results for input(s): "TSH", "T4TOTAL", "FREET4", "T3FREE", "THYROIDAB" in the last  72 hours. Anemia Panel: No results for input(s): "VITAMINB12", "FOLATE", "FERRITIN", "TIBC", "IRON", "RETICCTPCT" in the last 72 hours. Most Recent Urinalysis On File:     Component Value Date/Time   COLORURINE STRAW (A) 09/07/2022 1308   APPEARANCEUR HAZY (A) 09/07/2022 1308   APPEARANCEUR Clear 12/08/2017 1156   LABSPEC >1.046 (H) 09/07/2022 1308   PHURINE 5.0 09/07/2022 1308   GLUCOSEU >=500 (A) 09/07/2022 1308   HGBUR SMALL (A) 09/07/2022 1308   BILIRUBINUR NEGATIVE 09/07/2022 1308   BILIRUBINUR negative 12/08/2017 1217   BILIRUBINUR Negative 12/08/2017 1156   KETONESUR NEGATIVE 09/07/2022 1308   PROTEINUR NEGATIVE 09/07/2022 1308   UROBILINOGEN 0.2 12/08/2017 1217   NITRITE NEGATIVE 09/07/2022 1308   LEUKOCYTESUR LARGE (A) 09/07/2022 1308   Sepsis Labs: @LABRCNTIP (procalcitonin:4,lacticidven:4) Microbiology: Recent Results (from the past 240 hour(s))  Urine Culture (for pregnant, neutropenic or urologic patients or patients with an indwelling urinary catheter)     Status: Abnormal   Collection Time: 09/07/22  1:08 PM   Specimen: Urine, Clean Catch  Result Value Ref Range Status   Specimen Description   Final    URINE, CLEAN CATCH Performed at [facility], [address], [place], [place] [address]    Special Requests   Final    NONE Performed at [facility], [address] [place], [place] [address]    Culture (A)  Final    >=100,000 COLONIES/mL STAPHYLOCOCCUS AUREUS >=100,000 COLONIES/mL GROUP B STREP(S.AGALACTIAE)ISOLATED TESTING AGAINST S. AGALACTIAE NOT ROUTINELY PERFORMED DUE TO PREDICTABILITY OF AMP/PEN/VAN SUSCEPTIBILITY. Performed at [facility], [address] [place], [place] [address]    Report Status 09/10/2022 FINAL  Final   Organism ID, Bacteria STAPHYLOCOCCUS AUREUS (A)  Final      Susceptibility   Staphylococcus aureus - MIC*    CIPROFLOXACIN 1 SENSITIVE Sensitive  GENTAMICIN <=0.5 SENSITIVE Sensitive      NITROFURANTOIN <=16 SENSITIVE Sensitive     OXACILLIN <=0.25 SENSITIVE Sensitive     TETRACYCLINE <=1 SENSITIVE Sensitive     VANCOMYCIN 1 SENSITIVE Sensitive     TRIMETH/SULFA <=10 SENSITIVE Sensitive     CLINDAMYCIN RESISTANT Resistant     RIFAMPIN <=0.5 SENSITIVE Sensitive     Inducible Clindamycin POSITIVE Resistant     * >=100,000 COLONIES/mL STAPHYLOCOCCUS AUREUS      Radiology Studies last 3 days: ECHO TEE  Result Date: 09/10/2022    TRANSESOPHOGEAL ECHO REPORT   Patient Name:   NAHSHON REICH [facility] Date of Exam: 09/10/2022 Medical Rec #:  [id number]       Height:       68.0 in Accession #:    [id number]      Weight:       318.6 lb Date of Birth:  [birth year]       BSA:          2.491 m Patient Age:    57 years        BP:           106/80 mmHg Patient Gender: M               HR:           77 bpm. Exam Location:  [facility] Procedure: Transesophageal Echo, Cardiac Doppler, Color Doppler and Saline            Contrast Bubble Study Indications:     Not listed on TEE check in sheet  History:         Patient has prior history of Echocardiogram examinations, most                  recent 09/08/2022. CHF, Angina and CAD, Pulmonary HTN,                  Signs/Symptoms:Chest Pain and Dyspnea; Risk                  Factors:Hypertension, Sleep Apnea, Diabetes and Dyslipidemia.  Sonographer:     Mikki Harbor Sonographer#2:   Cristela Blue Referring Phys:  [id number] CHRISTOPHER RONALD BERGE Diagnosing Phys: Julien Nordmann MD PROCEDURE: After discussion of the risks and benefits of a TEE, an informed consent was obtained from the patient. TEE procedure time was 30 minutes. The transesophogeal probe was passed without difficulty through the esophogus of the patient. Imaged were obtained with the patient in a left lateral decubitus position. Sedation performed by different physician. Image quality was good. The patient's vital signs; including heart rate, blood pressure, and oxygen saturation; remained stable throughout the   procedure. The patient developed no complications during the procedure.  IMPRESSIONS  1. Left ventricular ejection fraction, by estimation, is 60 to 65%. The left ventricle has normal function. The left ventricle has no regional wall motion abnormalities. There is mild concentric LVH with moderate asymmetric left ventricular hypertrophy of the basal-septal segment.  2. Right ventricular systolic function is normal. The right ventricular size is normal.  3. The mitral valve is normal in structure. No evidence of mitral valve regurgitation. No evidence of mitral stenosis.  4. The aortic valve is tricuspid. Select images suggesting mild valve prolapse LCC. There is mild calcification of the aortic valve. Aortic valve regurgitation is moderate. Visually with mild aortic valve stenosis. No subvalvular membrane noted.  5. The inferior vena cava is normal in size with greater than 50% respiratory  variability, suggesting right atrial pressure of 3 mmHg.  6. No left atrial/left atrial appendage thrombus was detected.  7. Agitated saline contrast bubble study was negative, with no evidence of any interatrial shunt. Conclusion(s)/Recommendation(s): Normal biventricular function without evidence of hemodynamically significant valvular heart disease. FINDINGS  Left Ventricle: Left ventricular ejection fraction, by estimation, is 60 to 65%. The left ventricle has normal function. The left ventricle has no regional wall motion abnormalities. The left ventricular internal cavity size was normal in size. There is  moderate asymmetric left ventricular hypertrophy of the basal-septal segment. Right Ventricle: The right ventricular size is normal. No increase in right ventricular wall thickness. Right ventricular systolic function is normal. Left Atrium: Left atrial size was normal in size. No left atrial/left atrial appendage thrombus was detected. Right Atrium: Right atrial size was normal in size. Pericardium: There is no evidence  of pericardial effusion. Mitral Valve: The mitral valve is normal in structure. No evidence of mitral valve regurgitation. No evidence of mitral valve stenosis. Tricuspid Valve: The tricuspid valve is normal in structure. Tricuspid valve regurgitation is not demonstrated. No evidence of tricuspid stenosis. Aortic Valve: The aortic valve is tricuspid. There is mild calcification of the aortic valve. Aortic valve regurgitation is moderate. Mild aortic stenosis is present. Pulmonic Valve: The pulmonic valve was normal in structure. Pulmonic valve regurgitation is not visualized. No evidence of pulmonic stenosis. Aorta: The aortic root is normal in size and structure. Venous: The inferior vena cava is normal in size with greater than 50% respiratory variability, suggesting right atrial pressure of 3 mmHg. IAS/Shunts: No atrial level shunt detected by color flow Doppler. Agitated saline contrast was given intravenously to evaluate for intracardiac shunting. Agitated saline contrast bubble study was negative, with no evidence of any interatrial shunt. Julien Nordmann MD Electronically signed by Julien Nordmann MD Signature Date/Time: 09/10/2022/3:05:49 PM    Final    ECHOCARDIOGRAM COMPLETE  Result Date: 09/09/2022    ECHOCARDIOGRAM REPORT   Patient Name:   CARDELL RACHEL [facility] Date of Exam: 09/08/2022 Medical Rec #:  [id number]       Height:       68.0 in Accession #:    [id number]      Weight:       311.1 lb Date of Birth:  05/29/1965       BSA:          2.466 m Patient Age:    57 years        BP:           116/68 mmHg Patient Gender: M               HR:           122 bpm. Exam Location:  [facility] Procedure: 2D Echo, Cardiac Doppler, Color Doppler and Intracardiac            Opacification Agent Indications:     R07.9 Chest pain  History:         Patient has no prior history of Echocardiogram examinations.                  CAD; Risk Factors:Hypertension, Diabetes and Dyslipidemia.                  Obstructive sleep apnea-CPAP.  Pulmonary hypertension.                  Nonischemic cardiomyopathy.  Sonographer:     Daphine Deutscher RDCS Referring Phys:  [phone]  CHRISTOPHER END Diagnosing Phys: Julien Nordmann MD  Sonographer Comments: Technically difficult study due to poor echo windows and patient is obese. IMPRESSIONS  1. Left ventricular ejection fraction, by estimation, is 60 to 65%. The left ventricle has normal function. The left ventricle has no regional wall motion abnormalities. There is mild left ventricular hypertrophy. Left ventricular diastolic parameters are consistent with Grade I diastolic dysfunction (impaired relaxation).  2. Right ventricular systolic function is normal. The right ventricular size is normal.  3. The mitral valve is normal in structure. No evidence of mitral valve regurgitation. No evidence of mitral stenosis.  4. The aortic valve has an indeterminant number of cusps. Aortic valve regurgitation is not visualized. Aortic valve sclerosis/calcification is present, without any evidence of aortic stenosis.  5. The inferior vena cava is normal in size with greater than 50% respiratory variability, suggesting right atrial pressure of 3 mmHg. FINDINGS  Left Ventricle: Left ventricular ejection fraction, by estimation, is 60 to 65%. The left ventricle has normal function. The left ventricle has no regional wall motion abnormalities. Definity contrast agent was given IV to delineate the left ventricular  endocardial borders. The left ventricular internal cavity size was normal in size. There is mild left ventricular hypertrophy. Left ventricular diastolic parameters are consistent with Grade I diastolic dysfunction (impaired relaxation). Right Ventricle: The right ventricular size is normal. No increase in right ventricular wall thickness. Right ventricular systolic function is normal. Left Atrium: Left atrial size was normal in size. Right Atrium: Right atrial size was normal in size. Pericardium: There is no  evidence of pericardial effusion. Mitral Valve: The mitral valve is normal in structure. No evidence of mitral valve regurgitation. No evidence of mitral valve stenosis. Tricuspid Valve: The tricuspid valve is normal in structure. Tricuspid valve regurgitation is not demonstrated. No evidence of tricuspid stenosis. Aortic Valve: The aortic valve has an indeterminant number of cusps. Aortic valve regurgitation is not visualized. Aortic valve sclerosis/calcification is present, without any evidence of aortic stenosis. Pulmonic Valve: The pulmonic valve was normal in structure. Pulmonic valve regurgitation is not visualized. No evidence of pulmonic stenosis. Aorta: The aortic root is normal in size and structure. Venous: The inferior vena cava is normal in size with greater than 50% respiratory variability, suggesting right atrial pressure of 3 mmHg. IAS/Shunts: No atrial level shunt detected by color flow Doppler.  LEFT VENTRICLE PLAX 2D LVIDd:         3.80 cm   Diastology LVIDs:         2.70 cm   LV e' medial:    6.79 cm/s LV PW:         1.30 cm   LV E/e' medial:  9.8 LV IVS:        1.30 cm   LV e' lateral:   6.94 cm/s LVOT diam:     2.00 cm   LV E/e' lateral: 9.6 LVOT Area:     3.14 cm  RIGHT VENTRICLE RV Basal diam:  4.10 cm RV S prime:     19.35 cm/s TAPSE (M-mode): 1.7 cm LEFT ATRIUM         Index       RIGHT ATRIUM          Index LA diam:    3.60 cm 1.46 cm/m  RA Area:     8.41 cm  RA Volume:   18.10 ml 7.34 ml/m   AORTA Ao Root diam: 3.10 cm Ao Asc diam:  3.10 cm MITRAL VALVE MV Area (PHT): 5.90 cm    SHUNTS MV Decel Time: 129 msec    Systemic Diam: 2.00 cm MV E velocity: 66.70 cm/s MV A velocity: 87.40 cm/s MV E/A ratio:  0.76 Julien Nordmann MD Electronically signed by Julien Nordmann MD Signature Date/Time: 09/09/2022/7:49:50 AM    Final    CARDIAC CATHETERIZATION  Result Date: 09/08/2022 Conclusions: Mild-moderate, non-obstructive coronary artery disease.  No culprit  lesion identified for the patient's chest pain. Normal left ventricular filling pressure. Severe left ventricular outflow tract gradient. Recommendations: Add diltiazem for improved heart rate control. Gentle post-catheterization hydration. Obtain echocardiogram and review last year's cardiac MRI with Dr. Azucena Cecil.  May need to consider TEE for further evaluation of LVOT gradient, as I suspect this is driving Mr. Sidle symptoms. Aggressive secondary prevention of coronary artery disease.  Ticagrelor could be stopped tomorrow, at it will have been 12 months since PCI to ostial/proximal LAD. Yvonne Kendall, MD [facility]  CT ABDOMEN PELVIS W CONTRAST  Result Date: 09/07/2022 CLINICAL DATA:  57 year old male with chest pain. Shortness of breath. Lower abdominal pain. EXAM: CT ABDOMEN AND PELVIS WITH CONTRAST TECHNIQUE: Multidetector CT imaging of the abdomen and pelvis was performed using the standard protocol following bolus administration of intravenous contrast. RADIATION DOSE REDUCTION: This exam was performed according to the departmental dose-optimization program which includes automated exposure control, adjustment of the mA and/or kV according to patient size and/or use of iterative reconstruction technique. CONTRAST:  OMNIPAQUE IOHEXOL 350 MG/ML SOLN COMPARISON:  CTA chest today reported separately. CT Abdomen and Pelvis 04/26/2018. FINDINGS: Lower chest: CTA chest is reported separately today. Hepatobiliary: Negative liver and gallbladder. Pancreas: Negative. Spleen: Negative. Adrenals/Urinary Tract: Normal adrenal glands. Stable and negative kidneys. No nephrolithiasis. Decompressed, symmetric ureters. Unremarkable bladder. Stomach/Bowel: Diverticulosis of the descending and sigmoid colon with no active inflammation. Mostly decompressed large bowel, mild retained stool. Negative terminal ileum. Normal appendix coronal image 48. No dilated small bowel. Decompressed stomach and duodenum.  No free air, free fluid, or mesenteric inflammation identified. Vascular/Lymphatic: Suboptimal intravascular contrast timing but the major arterial structures appear patent and normal. Minimal calcified iliac artery atherosclerosis. Portal venous system appears to be patent. No lymphadenopathy. Reproductive: Negative. Other: No pelvis free fluid.  Incidental pelvic phleboliths. Musculoskeletal: Lower thoracic through upper lumbar chronic interbody ankylosis from bulky flowing endplate osteophytes. This has mildly progressed since 2019. superimposed lower lumbar degeneration. No acute osseous abnormality identified. SI joints remain patent. IMPRESSION: 1. No acute or inflammatory process identified in the abdomen or pelvis. Normal appendix. Diverticulosis of the distal colon without active inflammation. 2. CTA Chest reported separately. 3. Chronic thoracolumbar spinal ankylosis, Diffuse idiopathic skeletal hyperostosis (DISH). Electronically Signed   By: Odessa Fleming M.D.   On: 09/07/2022 10:43   CT Angio Chest PE W and/or Wo Contrast  Result Date: 09/07/2022 CLINICAL DATA:  57 year old male with chest pain. Shortness of breath. Lower abdominal pain. EXAM: CT ANGIOGRAPHY CHEST WITH CONTRAST TECHNIQUE: Multidetector CT imaging of the chest was performed using the standard protocol during bolus administration of intravenous contrast. Multiplanar CT image reconstructions and MIPs were obtained to evaluate the vascular anatomy. RADIATION DOSE REDUCTION: This exam was performed according to the departmental dose-optimization program which includes automated exposure control, adjustment of the mA and/or kV according to patient size and/or use of iterative reconstruction technique. CONTRAST:  OMNIPAQUE  IOHEXOL 350 MG/ML SOLN COMPARISON:  Chest radiographs 0818 hours today. CT Abdomen and Pelvis reported separately. Previous chest CTA 09/16/2021. FINDINGS: Cardiovascular: Adequate contrast bolus timing in the pulmonary  arterial tree. Mild respiratory motion. No central or lobar pulmonary artery filling defect. No convincing segmental or distal branch pulmonary artery thrombosis. Stable heart size, no cardiomegaly. Incidental mediastinal lipomatosis. No pericardial effusion. Thoracic aorta appears stable and negative. There is chronic left coronary artery stent. Mediastinum/Nodes: Negative. No mediastinal mass or lymphadenopathy. Lungs/Pleura: Stable lung volumes and mild atelectatic changes to the major airways which remain patent. No pleural effusion or consolidation. No suspicious pulmonary nodule or convincing pulmonary inflammation. Mild chronic scarring/atelectasis in the lingula and right middle lobe. Upper Abdomen: CT Abdomen and Pelvis reported separately today. Musculoskeletal: Widespread chronic thoracic spine interbody ankylosis from flowing endplate osteophytes, Diffuse idiopathic skeletal hyperostosis (DISH). T1-T2 spared as before. No acute osseous abnormality identified. Review of the MIP images confirms the above findings. IMPRESSION: 1. Mildly degraded by respiratory motion. No pulmonary embolus identified. 2. No acute or inflammatory process identified in the Chest. 3. CT Abdomen and Pelvis reported separately. 4. Thoracic spinal ankylosis, Diffuse idiopathic skeletal hyperostosis (DISH). Electronically Signed   By: Odessa Fleming M.D.   On: 09/07/2022 10:38   US Venous Img Lower Bilateral  Result Date: 09/07/2022 CLINICAL DATA:  Lower extremity pain for 2-3 weeks EXAM: BILATERAL LOWER EXTREMITY VENOUS DOPPLER ULTRASOUND TECHNIQUE: Gray-scale sonography with graded compression, as well as color Doppler and duplex ultrasound were performed to evaluate the lower extremity deep venous systems from the level of the common femoral vein and including the common femoral, femoral, profunda femoral, popliteal and calf veins including the posterior tibial, peroneal and gastrocnemius veins when visible. The superficial great  saphenous vein was also interrogated. Spectral Doppler was utilized to evaluate flow at rest and with distal augmentation maneuvers in the common femoral, femoral and popliteal veins. COMPARISON:  None Available. FINDINGS: RIGHT LOWER EXTREMITY Common Femoral Vein: No evidence of thrombus. Normal compressibility, respiratory phasicity and response to augmentation. Saphenofemoral Junction: No evidence of thrombus. Normal compressibility and flow on color Doppler imaging. Profunda Femoral Vein: No evidence of thrombus. Normal compressibility and flow on color Doppler imaging. Femoral Vein: No evidence of thrombus. Normal compressibility, respiratory phasicity and response to augmentation. Popliteal Vein: No evidence of thrombus. Normal compressibility, respiratory phasicity and response to augmentation. Calf Veins: No evidence of thrombus. Normal compressibility and flow on color Doppler imaging. Superficial Great Saphenous Vein: No evidence of thrombus. Normal compressibility. Venous Reflux:  None. Other Findings:  None. LEFT LOWER EXTREMITY Common Femoral Vein: No evidence of thrombus. Normal compressibility, respiratory phasicity and response to augmentation. Saphenofemoral Junction: No evidence of thrombus. Normal compressibility and flow on color Doppler imaging. Profunda Femoral Vein: No evidence of thrombus. Normal compressibility and flow on color Doppler imaging. Femoral Vein: No evidence of thrombus. Normal compressibility, respiratory phasicity and response to augmentation. Popliteal Vein: No evidence of thrombus. Normal compressibility, respiratory phasicity and response to augmentation. Calf Veins: No evidence of thrombus. Normal compressibility and flow on color Doppler imaging. Superficial Great Saphenous Vein: No evidence of thrombus. Normal compressibility. Venous Reflux:  None. Other Findings:  None. IMPRESSION: No evidence of deep venous thrombosis in either lower extremity. Electronically Signed    By: Alcide Clever M.D.   On: 09/07/2022 10:01   DG Chest 2 View  Result Date: 09/07/2022 CLINICAL DATA:  Chest pain EXAM: CHEST - 2 VIEW COMPARISON:  07/03/2022 FINDINGS: The heart size  and mediastinal contours are within normal limits. Both lungs are clear. The visualized skeletal structures are unremarkable except for degenerative changes throughout the spine. IMPRESSION: No active cardiopulmonary disease. Electronically Signed   By: Judie Petit.  Shick M.D.   On: 09/07/2022 08:34             LOS: 2 days      Silvano Bilis, MD [facility] 09/10/2022, 3:42 PM    Staff may message me via secure chat in Epic  but this may not receive an immediate response,  please page me for urgent matters!  If 7PM-7AM, please contact night coverage [email or web address]

## 2022-09-10 NOTE — Progress Notes (Signed)
Transesophageal Echocardiogram :  Indication: Left ventricular outflow tract gradient, concern for subaortic membrane Requesting/ordering  physician: Wouk  Procedure: Benzocaine spray x2 and 2 mls x 2 of viscous lidocaine were given orally to provide local anesthesia to the oropharynx. The patient was positioned supine on the left side, bite block provided. The patient was moderately sedated with the doses of versed and fentanyl as detailed below.  Using digital technique an omniplane probe was advanced into the distal esophagus without incident.   Moderate sedation: 1. Sedation used: Per anesthesia, propofol given  See report in EPIC  for complete details: In brief, transgastric imaging revealed normal LV function with no RWMAs and no mural apical thrombus.  .  Mild concentric LVH with moderate septal hypertrophy, most notable in the basal septal region.  estimated ejection fraction was 55%.  Right sided cardiac chambers were normal with no evidence of pulmonary hypertension.  Imaging of the septum showed no ASD or VSD Bubble study was negative for shunt 2D and color flow confirmed no PFO  No subaortic valve membrane noted  The LA was well visualized in orthogonal views.  There was no spontaneous contrast and no thrombus in the LA and LA appendage   The descending thoracic aorta had no  mural aortic debris with no evidence of aneurysmal dilation or disection   Julien Nordmann 09/10/2022 1:38 PM

## 2022-09-10 NOTE — Progress Notes (Signed)
*  PRELIMINARY RESULTS* Echocardiogram Echocardiogram Transesophageal has been performed.  Carolyne Fiscal 09/10/2022, 1:18 PM

## 2022-09-10 NOTE — Anesthesia Preprocedure Evaluation (Signed)
Anesthesia Evaluation  Patient identified by MRN, date of birth, ID band Patient awake    Reviewed: Allergy & Precautions, NPO status , Patient's Chart, lab work & pertinent test results  History of Anesthesia Complications Negative for: history of anesthetic complications  Airway Mallampati: III  TM Distance: <3 FB Neck ROM: full    Dental  (+) Chipped, Poor Dentition, Missing   Pulmonary shortness of breath and with exertion, asthma , sleep apnea and Continuous Positive Airway Pressure Ventilation    Pulmonary exam normal        Cardiovascular Exercise Tolerance: Good hypertension, (-) angina + CAD, + Cardiac Stents and +CHF  Normal cardiovascular exam     Neuro/Psych  PSYCHIATRIC DISORDERS      negative neurological ROS     GI/Hepatic ,GERD  Controlled,,(+) Hepatitis -  Endo/Other  diabetes, Type 2    Renal/GU Renal disease  negative genitourinary   Musculoskeletal   Abdominal   Peds  Hematology negative hematology ROS (+)   Anesthesia Other Findings Past Medical History: No date: Asthma No date: CAD (coronary artery disease)     Comment:  a. 08/2017 Cath: Diff nonobs dzs, EF 45-50%, diff HK; b.               08/2021 MV: Ant/apical ishemia; c. 08/2021 PCI: LAD 95ost               (4.0x15 Onyx Frontier DES), EF 55-65%; c. 09/2021 Cath: LM              nl, LAD patent stent, 70m, 30d, RI min irregs, LCX min               irregs, RCA 50p. LVOT grad 50-54mmHg (rest), 70-114mmHg               (provoked). 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 08/2017 LV gram: EF 45-50%, glob HK; b. 08/2021 Echo:               EF 60-65%, no rwma, GrI DD, nl RV fxn; c. 09/2021 cMRI: EF              60%, no LVOT  obstruction/gradient. Mild conc LVH, mild               AS. No myocardial scar/fibrosis. No evidence of HCM. 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 08/2017 LV gram: EF 45-50%, glob HK; b. 08/2021 Echo:               EF 60-65%. No date: Morbid obesity No date: OSA on CPAP     Comment:  non compliant No date: Pulmonary hypertension  Past Surgical History: No date: CARDIAC CATHETERIZATION     Comment:  [place] no stents. 04/11/2019: COLONOSCOPY WITH PROPOFOL; N/A     Comment:  Procedure: COLONOSCOPY WITH PROPOFOL;  Surgeon: Midge Minium, MD;  Location: [facility];  Service:               Endoscopy;  Laterality: N/A; 09/08/2021: CORONARY STENT INTERVENTION; N/A     Comment:  Procedure: CORONARY STENT INTERVENTION;  Surgeon: [name], MD;  Location: [facility];                Service: Cardiovascular;  Laterality: N/A; 09/08/2021: CORONARY ULTRASOUND/IVUS; N/A     Comment:  Procedure: Intravascular Ultrasound/IVUS;  Surgeon:               [name], MD;  Location: [facility] CV [facility];                Service: Cardiovascular;  Laterality: N/A; 09/08/2021: LEFT HEART CATH AND CORONARY ANGIOGRAPHY; N/A     Comment:  Procedure: LEFT HEART CATH AND CORONARY ANGIOGRAPHY;                Surgeon: [name], MD;  Location: [facility] INVAS[facility]               CV LAB;  Service: Cardiovascular;  Laterality: N/A; 09/17/2021: LEFT HEART CATH AND CORONARY ANGIOGRAPHY; N/A     Comment:  Procedure: LEFT HEART CATH AND CORONARY ANGIOGRAPHY;                Surgeon: Yvonne Kendall, MD;  Location: [facility]               CV LAB;  Service: Cardiovascular;  Laterality: N/A; 09/08/2022: LEFT HEART CATH AND CORONARY ANGIOGRAPHY; N/A     Comment:  Procedure: LEFT HEART CATH AND CORONARY ANGIOGRAPHY;                Surgeon: Yvonne Kendall, MD;  Location: [facility]  INVASIVE               CV LAB;  Service: Cardiovascular;  Laterality: N/A; 09/02/2017: RIGHT/LEFT HEART CATH AND CORONARY ANGIOGRAPHY; N/A     Comment:  Procedure: RIGHT/LEFT HEART CATH AND CORONARY               ANGIOGRAPHY;  Surgeon: [name], MD;  Location:               [facility];  Service: Cardiovascular;                Laterality: N/A;  BMI    Body Mass Index: 48.44 kg/m      Reproductive/Obstetrics negative OB ROS                             Anesthesia Physical Anesthesia Plan  ASA: 3  Anesthesia Plan: General   Post-op Pain Management:    Induction: Intravenous  PONV Risk Score and Plan: Propofol infusion and TIVA  Airway Management Planned: Natural Airway and Nasal Cannula  Additional Equipment:   Intra-op Plan:   Post-operative Plan:   Informed Consent: I have reviewed the patients History and Physical, chart, labs and discussed the procedure including the risks, benefits and alternatives for the proposed anesthesia with the patient or authorized representative who has indicated his/her understanding and acceptance.     Dental Advisory Given  Plan Discussed with: Anesthesiologist, CRNA and Surgeon  Anesthesia Plan Comments: (Patient consented for risks of anesthesia including but not limited to:  - adverse reactions to medications - risk of airway placement if required - damage to eyes, teeth, lips or other oral mucosa - nerve damage due to positioning  - sore throat or hoarseness - Damage to heart, brain, nerves, lungs,  other parts of body or loss of life  Patient voiced understanding.)       Anesthesia Quick Evaluation

## 2022-09-10 NOTE — Progress Notes (Signed)
Rounding Note    Patient Name: Brett Marshall Date of Encounter: 09/10/2022  [facility] Cardiologist: Lorine Bears, MD   Subjective   No events overnight Transesophageal echo performed today Mild aortic valve stenosis, moderate AI Normal ejection fraction, no subvalvular membrane noted to explain left ventricular outflow tract gradient At least moderate basal septal thickening  Blood pressure running low on isosorbide 30 and metoprolol 25 twice daily Has not received any diltiazem secondary to hypotension  Inpatient Medications    Scheduled Meds:  aspirin EC  81 mg Oral Daily   atorvastatin  40 mg Oral q1800   diltiazem  30 mg Oral Q6H   DULoxetine  120 mg Oral Daily   enoxaparin (LOVENOX) injection  0.5 mg/kg Subcutaneous Q24H   insulin aspart  0-5 Units Subcutaneous QHS   insulin aspart  0-9 Units Subcutaneous TID WC   [START ON 09/11/2022] insulin glargine-yfgn  18 Units Subcutaneous Daily   insulin glargine-yfgn  8 Units Subcutaneous Once   [START ON 09/11/2022] isosorbide mononitrate  15 mg Oral Daily   metoprolol tartrate  100 mg Oral BID   mometasone-formoterol  2 puff Inhalation BID   pantoprazole  40 mg Oral BID   polyethylene glycol  17 g Oral Daily   sodium chloride flush  3 mL Intravenous Q12H   Continuous Infusions:  sodium chloride     cefTRIAXone (ROCEPHIN)  IV 1 g (09/09/22 1934)   PRN Meds: sodium chloride, acetaminophen, albuterol, cyclobenzaprine, dextromethorphan-guaiFENesin, hydrALAZINE, nitroGLYCERIN, ondansetron (ZOFRAN) IV, oxyCODONE-acetaminophen, sodium chloride flush, traZODone   Vital Signs    Vitals:   09/10/22 1253 09/10/22 1300 09/10/22 1315 09/10/22 1330  BP: 93/66 98/66 101/71 98/66  Pulse: 72 71 71 72  Resp: 14 17 19 18   Temp:      TempSrc:      SpO2: 96% 94% 93% 94%  Weight:        Intake/Output Summary (Last 24 hours) at 09/10/2022 1506 Last data filed at 09/10/2022 1244 Gross per 24 hour  Intake 2161.67 ml   Output --  Net 2161.67 ml      09/10/2022    3:26 AM 09/09/2022    4:34 AM 09/08/2022   12:36 AM  Last 3 Weights  Weight (lbs) 318 lb 9 oz 315 lb 6.4 oz 311 lb 1.1 oz  Weight (kg) 144.5 kg 143.065 kg 141.1 kg      Telemetry    Normal sinus rhythm- Personally Reviewed  ECG     - Personally Reviewed  Physical Exam   GEN: No acute distress.   Neck: No JVD Cardiac: RRR, 1/6 systolic ejection murmur right sternal border  No rubs, or gallops.  Respiratory: Clear to auscultation bilaterally. GI: Soft, nontender, non-distended  MS: No edema; No deformity. Neuro:  Nonfocal  Psych: Normal affect   Labs    High Sensitivity Troponin:   Recent Labs  Lab 09/07/22 0827 09/07/22 1015 09/07/22 1308 09/07/22 1622 09/07/22 2126  TROPONINIHS 6 5 5 4 5      Chemistry Recent Labs  Lab 09/07/22 0827 09/08/22 0453 09/10/22 0621  NA 134* 136 137  K 3.6 4.1 3.9  CL 99 102 107  CO2 23 23 22   GLUCOSE 308* 214* 324*  BUN 19 15 16   CREATININE 1.27* 1.23 1.17  CALCIUM 8.5* 8.6* 8.4*  PROT 7.3  --   --   ALBUMIN 3.1*  --   --   AST 21  --   --   ALT  18  --   --   ALKPHOS 125  --   --   BILITOT 0.7  --   --   GFRNONAA >60 >60 >60  ANIONGAP Lipids  Recent Labs  Lab 09/08/22 0453  CHOL 109  TRIG 138  HDL 31*  LDLCALC 50  CHOLHDL 3.5    Hematology Recent Labs  Lab 09/07/22 2126 09/08/22 0453 09/10/22 0621  WBC 8.7 8.4 7.1  RBC 4.47 4.61 4.56  HGB 12.4* 12.8* 12.5*  HCT 37.7* 39.0 38.9*  MCV 84.3 84.6 85.3  MCH 27.7 27.8 27.4  MCHC 32.9 32.8 32.1  RDW 14.1 14.1 14.0  PLT 220 218 187   Thyroid No results for input(s): "TSH", "FREET4" in the last 168 hours.  BNP Recent Labs  Lab 09/07/22 0827  BNP 39.4    DDimer No results for input(s): "DDIMER" in the last 168 hours.   Radiology    ECHO TEE  Result Date: 09/10/2022    TRANSESOPHOGEAL ECHO REPORT   Patient Name:   Brett Marshall [facility] Date of Exam: 09/10/2022 Medical Rec #:  [id number]        Height:       68.0 in Accession #:    [id number]      Weight:       318.6 lb Date of Birth:  30-Jun-1965       BSA:          2.491 m Patient Age:    57 years        BP:           106/80 mmHg Patient Gender: M               HR:           77 bpm. Exam Location:  [facility] Procedure: Transesophageal Echo, Cardiac Doppler, Color Doppler and Saline            Contrast Bubble Study Indications:     Not listed on TEE check in sheet  History:         Patient has prior history of Echocardiogram examinations, most                  recent 09/08/2022. CHF, Angina and CAD, Pulmonary HTN,                  Signs/Symptoms:Chest Pain and Dyspnea; Risk                  Factors:Hypertension, Sleep Apnea, Diabetes and Dyslipidemia.  Sonographer:     Mikki Harbor Sonographer#2:   Cristela Blue Referring Phys:  [id number] CHRISTOPHER RONALD BERGE Diagnosing Phys: Julien Nordmann MD PROCEDURE: After discussion of the risks and benefits of a TEE, an informed consent was obtained from the patient. TEE procedure time was 30 minutes. The transesophogeal probe was passed without difficulty through the esophogus of the patient. Imaged were obtained with the patient in a left lateral decubitus position. Sedation performed by different physician. Image quality was good. The patient's vital signs; including heart rate, blood pressure, and oxygen saturation; remained stable throughout the  procedure. The patient developed no complications during the procedure.  IMPRESSIONS  1. Left ventricular ejection fraction, by estimation, is 60 to 65%. The left ventricle has normal function. The left ventricle has no regional wall motion abnormalities. There is mild concentric LVH with moderate asymmetric left ventricular hypertrophy of the basal-septal segment.  2. Right ventricular systolic function is normal.  The right ventricular size is normal.  3. The mitral valve is normal in structure. No evidence of mitral valve regurgitation. No evidence of mitral stenosis.  4.  The aortic valve is tricuspid. Select images suggesting mild valve prolapse LCC. There is mild calcification of the aortic valve. Aortic valve regurgitation is moderate. Visually with mild aortic valve stenosis. No subvalvular membrane noted.  5. The inferior vena cava is normal in size with greater than 50% respiratory variability, suggesting right atrial pressure of 3 mmHg.  6. No left atrial/left atrial appendage thrombus was detected.  7. Agitated saline contrast bubble study was negative, with no evidence of any interatrial shunt. Conclusion(s)/Recommendation(s): Normal biventricular function without evidence of hemodynamically significant valvular heart disease. FINDINGS  Left Ventricle: Left ventricular ejection fraction, by estimation, is 60 to 65%. The left ventricle has normal function. The left ventricle has no regional wall motion abnormalities. The left ventricular internal cavity size was normal in size. There is  moderate asymmetric left ventricular hypertrophy of the basal-septal segment. Right Ventricle: The right ventricular size is normal. No increase in right ventricular wall thickness. Right ventricular systolic function is normal. Left Atrium: Left atrial size was normal in size. No left atrial/left atrial appendage thrombus was detected. Right Atrium: Right atrial size was normal in size. Pericardium: There is no evidence of pericardial effusion. Mitral Valve: The mitral valve is normal in structure. No evidence of mitral valve regurgitation. No evidence of mitral valve stenosis. Tricuspid Valve: The tricuspid valve is normal in structure. Tricuspid valve regurgitation is not demonstrated. No evidence of tricuspid stenosis. Aortic Valve: The aortic valve is tricuspid. There is mild calcification of the aortic valve. Aortic valve regurgitation is moderate. Mild aortic stenosis is present. Pulmonic Valve: The pulmonic valve was normal in structure. Pulmonic valve regurgitation is not visualized.  No evidence of pulmonic stenosis. Aorta: The aortic root is normal in size and structure. Venous: The inferior vena cava is normal in size with greater than 50% respiratory variability, suggesting right atrial pressure of 3 mmHg. IAS/Shunts: No atrial level shunt detected by color flow Doppler. Agitated saline contrast was given intravenously to evaluate for intracardiac shunting. Agitated saline contrast bubble study was negative, with no evidence of any interatrial shunt. Julien Nordmann MD Electronically signed by Julien Nordmann MD Signature Date/Time: 09/10/2022/3:05:49 PM    Final    ECHOCARDIOGRAM COMPLETE  Result Date: 09/09/2022    ECHOCARDIOGRAM REPORT   Patient Name:   Brett Marshall [facility] Date of Exam: 09/08/2022 Medical Rec #:  [id number]       Height:       68.0 in Accession #:    [id number]      Weight:       311.1 lb Date of Birth:  06-14-1965       BSA:          2.466 m Patient Age:    57 years        BP:           116/68 mmHg Patient Gender: M               HR:           122 bpm. Exam Location:  [facility] Procedure: 2D Echo, Cardiac Doppler, Color Doppler and Intracardiac            Opacification Agent Indications:     R07.9 Chest pain  History:         Patient has no prior history of Echocardiogram  examinations.                  CAD; Risk Factors:Hypertension, Diabetes and Dyslipidemia.                  Obstructive sleep apnea-CPAP. Pulmonary hypertension.                  Nonischemic cardiomyopathy.  Sonographer:     Sedonia Small Rodgers-Jones RDCS Referring Phys:  [id number] CHRISTOPHER END Diagnosing Phys: Julien Nordmann MD  Sonographer Comments: Technically difficult study due to poor echo windows and patient is obese. IMPRESSIONS  1. Left ventricular ejection fraction, by estimation, is 60 to 65%. The left ventricle has normal function. The left ventricle has no regional wall motion abnormalities. There is mild left ventricular hypertrophy. Left ventricular diastolic parameters are consistent with Grade I  diastolic dysfunction (impaired relaxation).  2. Right ventricular systolic function is normal. The right ventricular size is normal.  3. The mitral valve is normal in structure. No evidence of mitral valve regurgitation. No evidence of mitral stenosis.  4. The aortic valve has an indeterminant number of cusps. Aortic valve regurgitation is not visualized. Aortic valve sclerosis/calcification is present, without any evidence of aortic stenosis.  5. The inferior vena cava is normal in size with greater than 50% respiratory variability, suggesting right atrial pressure of 3 mmHg. FINDINGS  Left Ventricle: Left ventricular ejection fraction, by estimation, is 60 to 65%. The left ventricle has normal function. The left ventricle has no regional wall motion abnormalities. Definity contrast agent was given IV to delineate the left ventricular  endocardial borders. The left ventricular internal cavity size was normal in size. There is mild left ventricular hypertrophy. Left ventricular diastolic parameters are consistent with Grade I diastolic dysfunction (impaired relaxation). Right Ventricle: The right ventricular size is normal. No increase in right ventricular wall thickness. Right ventricular systolic function is normal. Left Atrium: Left atrial size was normal in size. Right Atrium: Right atrial size was normal in size. Pericardium: There is no evidence of pericardial effusion. Mitral Valve: The mitral valve is normal in structure. No evidence of mitral valve regurgitation. No evidence of mitral valve stenosis. Tricuspid Valve: The tricuspid valve is normal in structure. Tricuspid valve regurgitation is not demonstrated. No evidence of tricuspid stenosis. Aortic Valve: The aortic valve has an indeterminant number of cusps. Aortic valve regurgitation is not visualized. Aortic valve sclerosis/calcification is present, without any evidence of aortic stenosis. Pulmonic Valve: The pulmonic valve was normal in structure.  Pulmonic valve regurgitation is not visualized. No evidence of pulmonic stenosis. Aorta: The aortic root is normal in size and structure. Venous: The inferior vena cava is normal in size with greater than 50% respiratory variability, suggesting right atrial pressure of 3 mmHg. IAS/Shunts: No atrial level shunt detected by color flow Doppler.  LEFT VENTRICLE PLAX 2D LVIDd:         3.80 cm   Diastology LVIDs:         2.70 cm   LV e' medial:    6.79 cm/s LV PW:         1.30 cm   LV E/e' medial:  9.8 LV IVS:        1.30 cm   LV e' lateral:   6.94 cm/s LVOT diam:     2.00 cm   LV E/e' lateral: 9.6 LVOT Area:     3.14 cm  RIGHT VENTRICLE RV Basal diam:  4.10 cm RV S prime:  19.35 cm/s TAPSE (M-mode): 1.7 cm LEFT ATRIUM         Index       RIGHT ATRIUM          Index LA diam:    3.60 cm 1.46 cm/m  RA Area:     8.41 cm                                 RA Volume:   18.10 ml 7.34 ml/m   AORTA Ao Root diam: 3.10 cm Ao Asc diam:  3.10 cm MITRAL VALVE MV Area (PHT): 5.90 cm    SHUNTS MV Decel Time: 129 msec    Systemic Diam: 2.00 cm MV E velocity: 66.70 cm/s MV A velocity: 87.40 cm/s MV E/A ratio:  0.76 Julien Nordmann MD Electronically signed by Julien Nordmann MD Signature Date/Time: 09/09/2022/7:49:50 AM    Final     Cardiac Studies   Transesophageal echo Normal LV function, normal RV function Mild to moderate AI, mild calcification of trileaflet aortic valve Severe hypertrophy of basal septum 1.5 cm No subaortic valve membrane clearly noted  Patient Profile     57 y.o. 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   Assessment & Plan    Coronary artery disease with stable angina  stent placement to the LAD 1 year ago Catheterization performed 2 days ago with patent stent, nonobstructive disease  noted  Unable to exclude small vessel disease Decrease isosorbide down to 15 mg daily secondary to hypotension   Left ventricular outflow tract gradient left ventricular outflow tract gradient noted during catheterization Cardiac MRI reviewed from last year with concern for subaortic membrane Transesophageal echo today, with general anesthesia No subaortic membrane noted Basal septal wall thickening appreciated, moderate Etiology of outflow tract gradient unclear, discussed with team, will consider gated outpatient cardiac CTA   Chronic diastolic CHF Ejection fraction 60% Appears euvolemic   Diabetes type 2 with complications, poorly controlled A1c 11.7 Additional medications per medicine service   Morbid obesity We have encouraged continued exercise, careful diet management in an effort to lose weight.   Long discussion with team (Dr. Okey Dupre, dr. Myriam Forehand)  concerning MRI findings, transesophageal echo findings  Total encounter time more than 50 minutes  Greater than 50% was spent in counseling and coordination of care with the patient    For questions or updates, please contact [facility] Please consult [email or web address] for contact info under        Signed, Julien Nordmann, MD  09/10/2022, 3:06 PM

## 2022-09-10 NOTE — Transfer of Care (Signed)
Immediate Anesthesia Transfer of Care Note  Patient: Brett Marshall  Procedure(s) Performed: TRANSESOPHAGEAL ECHOCARDIOGRAM  Patient Location: PACU and Cath Lab  Anesthesia Type:General  Level of Consciousness: awake  Airway & Oxygen Therapy: Patient Spontanous Breathing and Patient connected to nasal cannula oxygen  Post-op Assessment: Report given to RN and Post -op Vital signs reviewed and stable  Post vital signs: Reviewed and stable  Last Vitals:  Vitals Value Taken Time  BP 95/79 09/10/22 1243  Temp    Pulse 73 09/10/22 1248  Resp 14 09/10/22 1248  SpO2 95 % 09/10/22 1248  Vitals shown include unvalidated device data.  Last Pain:  Vitals:   09/10/22 1201  TempSrc: Oral  PainSc: 0-No pain      Patients Stated Pain Goal: 0 (09/08/22 1620)  Complications: No notable events documented.

## 2022-09-10 NOTE — Inpatient Diabetes Management (Signed)
Inpatient Diabetes Program Recommendations  AACE/ADA: New Consensus Statement on Inpatient Glycemic Control (2015)  Target Ranges:  Prepandial:   less than 140 mg/dL      Peak postprandial:   less than 180 mg/dL (1-2 hours)      Critically ill patients:  140 - 180 mg/dL   Lab Results  Component Value Date   GLUCAP 251 (H) 09/10/2022   HGBA1C 11.7 (H) 09/07/2022    Review of Glycemic Control  Latest Reference Range & Units 09/09/22 08:14 09/09/22 12:11 09/09/22 16:17 09/09/22 21:13 09/10/22 08:28  Glucose-Capillary 70 - 99 mg/dL 865 (H) 784 (H) 696 (H) 285 (H) 251 (H)  (H): Data is abnormally high  Diabetes history: DM2 Outpatient Diabetes medications: Jardiance 25 mg daily, Metformin 1000 mg BID, Glipizide 5 mg BID (seen PCP on 09/04/22 and was asked to increase Glipizide to 10 mg BID but pt has not been able to increase yet) Current orders for Inpatient glycemic control: Novolog 0-9 units TID with meals, Novolog 0-5 units QHS, Semglee 10 units QHS  Inpatient Diabetes Program Recommendations:    Please consider:  Semglee 15 units QD  Will continue to follow while inpatient.  Thank you, Dulce Sellar, MSN, CDCES Diabetes Coordinator Inpatient Diabetes Program [phone] (team pager from 8a-5p)

## 2022-09-10 NOTE — Plan of Care (Signed)

## 2022-09-10 NOTE — Anesthesia Procedure Notes (Signed)
Procedure Name: MAC Date/Time: 09/10/2022 12:20 PM  Performed by: Cheral Bay, CRNAPre-anesthesia Checklist: Patient identified, Emergency Drugs available, Suction available, Patient being monitored and Timeout performed Patient Re-evaluated:Patient Re-evaluated prior to induction Oxygen Delivery Method: Supernova nasal CPAP Induction Type: IV induction Placement Confirmation: positive ETCO2 and CO2 detector

## 2022-09-11 ENCOUNTER — Encounter: Payer: Self-pay | Admitting: Cardiovascular Disease

## 2022-09-11 ENCOUNTER — Other Ambulatory Visit: Payer: Self-pay | Admitting: Medical

## 2022-09-11 ENCOUNTER — Encounter: Payer: Self-pay | Admitting: Nurse Practitioner

## 2022-09-11 DIAGNOSIS — E119 Type 2 diabetes mellitus without complications: Secondary | ICD-10-CM | POA: Diagnosis not present

## 2022-09-11 DIAGNOSIS — I5032 Chronic diastolic (congestive) heart failure: Secondary | ICD-10-CM | POA: Diagnosis not present

## 2022-09-11 DIAGNOSIS — I25118 Atherosclerotic heart disease of native coronary artery with other forms of angina pectoris: Secondary | ICD-10-CM | POA: Diagnosis not present

## 2022-09-11 DIAGNOSIS — R079 Chest pain, unspecified: Secondary | ICD-10-CM

## 2022-09-11 LAB — GLUCOSE, CAPILLARY: Glucose-Capillary: 245 mg/dL — ABNORMAL HIGH (ref 70–99)

## 2022-09-11 LAB — BASIC METABOLIC PANEL
Anion gap: 6 (ref 5–15)
BUN: 15 mg/dL (ref 6–20)
CO2: 23 mmol/L (ref 22–32)
Calcium: 8.3 mg/dL — ABNORMAL LOW (ref 8.9–10.3)
Chloride: 106 mmol/L (ref 98–111)
Creatinine, Ser: 1.13 mg/dL (ref 0.61–1.24)
GFR, Estimated: >60 mL/min (ref >60)
Glucose, Bld: 251 mg/dL — ABNORMAL HIGH (ref 70–99)
Potassium: 3.9 mmol/L (ref 3.5–5.1)
Sodium: 135 mmol/L (ref 135–145)

## 2022-09-11 LAB — CBC
HCT: 35.9 % — ABNORMAL LOW (ref 39.0–52.0)
Hemoglobin: 11.7 g/dL — ABNORMAL LOW (ref 13.0–17.0)
MCH: 27.5 pg (ref 26.0–34.0)
MCHC: 32.6 g/dL (ref 30.0–36.0)
MCV: 84.3 fL (ref 80.0–100.0)
Platelets: 192 10*3/uL (ref 150–400)
RBC: 4.26 MIL/uL (ref 4.22–5.81)
RDW: 14.2 % (ref 11.5–15.5)
WBC: 7.9 10*3/uL (ref 4.0–10.5)
nRBC: 0 % (ref 0.0–0.2)

## 2022-09-11 MED ORDER — CEPHALEXIN 500 MG PO CAPS: 500.0000 mg | ORAL_CAPSULE | Freq: Two times a day (BID) | ORAL | 0 refills | Status: DC

## 2022-09-11 MED ORDER — ISOSORBIDE MONONITRATE ER 30 MG PO TB24: 15.0000 mg | ORAL_TABLET | Freq: Every day | ORAL | 1 refills | Status: DC

## 2022-09-11 MED ORDER — MORPHINE SULFATE (PF) 2 MG/ML IV SOLN: 1.0000 mg | INTRAVENOUS | Status: DC | PRN

## 2022-09-11 MED ORDER — DILTIAZEM HCL 30 MG PO TABS: 30.0000 mg | ORAL_TABLET | Freq: Two times a day (BID) | ORAL | 1 refills | Status: DC

## 2022-09-11 MED ORDER — DILTIAZEM HCL 30 MG PO TABS: 30.0000 mg | ORAL_TABLET | Freq: Two times a day (BID) | ORAL | Status: DC

## 2022-09-11 NOTE — Anesthesia Postprocedure Evaluation (Signed)
Anesthesia Post Note  Patient: Brett Marshall  Procedure(s) Performed: TRANSESOPHAGEAL ECHOCARDIOGRAM  Patient location during evaluation: Specials Recovery Anesthesia Type: General Level of consciousness: awake and alert Pain management: pain level controlled Vital Signs Assessment: post-procedure vital signs reviewed and stable Respiratory status: spontaneous breathing, nonlabored ventilation, respiratory function stable and patient connected to nasal cannula oxygen Cardiovascular status: blood pressure returned to baseline and stable Postop Assessment: no apparent nausea or vomiting Anesthetic complications: no   No notable events documented.   Last Vitals:  Vitals:   09/11/22 0327 09/11/22 0912  BP: 119/74 110/75  Pulse: 76 78  Resp: 18 20  Temp: 36.9 C 36.5 C  SpO2: 100% 100%    Last Pain:  Vitals:   09/11/22 1000  TempSrc:   PainSc: 0-No pain                 Cleda Mccreedy Laurren Lepkowski

## 2022-09-11 NOTE — Inpatient Diabetes Management (Addendum)
Inpatient Diabetes Program Recommendations  AACE/ADA: New Consensus Statement on Inpatient Glycemic Control (2015)  Target Ranges:  Prepandial:   less than 140 mg/dL      Peak postprandial:   less than 180 mg/dL (1-2 hours)      Critically ill patients:  140 - 180 mg/dL   Lab Results  Component Value Date   GLUCAP 245 (H) 09/11/2022   HGBA1C 11.7 (H) 09/07/2022    NOTE:  Patient discharging today.  Discussed the Freestyle Libre 3 CGM.  He downloaded the App. Educated on how to apply, monitor serum glucose using his IPhone, water proof, and how to remove. He will ask his PCP for prescriptions.  Placed FSL 3 to the back of his left arm.  CGM warming up for 60 mins.  Provided him with an extra FSL 3 for when this one expires.    Thank you, Dulce Sellar, MSN, CDCES Diabetes Coordinator Inpatient Diabetes Program [phone] (team pager from 8a-5p)

## 2022-09-11 NOTE — Discharge Summary (Signed)
Brett Marshall:[id number] DOB: Oct 02, 1965 DOA: 09/07/2022  PCP: Ethelda Chick, MD  Admit date: 09/07/2022 Discharge date: 09/11/2022  Time spent: 35 minutes  Recommendations for Outpatient Follow-up:  Close cardiology and pcp f/u     Discharge Diagnoses:  Principal Problem:   Chest pain Active Problems:   Heart failure with mildly reduced ejection fraction (HFmrEF) (HCC)   CAD (coronary artery disease)   Essential hypertension   Chronic diastolic CHF (congestive heart failure) (HCC)   HLD (hyperlipidemia)   Type 2 diabetes mellitus without complication, without long-term current use of insulin (HCC)   Abdominal pain   Dark stools   Depression   OSA on CPAP   Morbid obesity (HCC)   Unstable angina (HCC)   Angina pectoris (HCC)   Left ventricular outflow tract obstruction   Discharge Condition: stable  Diet recommendation: heart healthy  Filed Weights   09/09/22 0434 09/10/22 0326 09/11/22 0327  Weight: (!) 143.1 kg (!) 144.5 kg (!) 144.2 kg    History of present illness:  From admission h and p Brett Marshall is a 57 y.o. male with medical history significant of CAD, s/p of stent placement, HTN, HLD, DM, asthma, dCHF, GERD, depression, obstructive hypertrophic cardiomyopathy, NASH, pulmonary hypertension, morbid obesity, OSA on CPAP, who presents with chest pain.   Patient states that he has intermittent chest pain for almost a week, which is located in substernal area, 8 out of 10 in severity today, exertional, aggravated by exertion sometimes, radiating to left shoulder and the neck, associated with mild short breath.  Patient has dry cough, no fever or chills.  He has nausea, no vomiting, diarrhea.  He reports mild left lower quadrant abdominal pain.  He states that his stool has been dark recently, every time he has bowel movement.  Also reports bilateral lower leg pain.  No symptoms of UTI.  Patient was given 324 mg of aspirin.  Hospital Course:  Patient  presents with chest pain. LHC with mild/moderate CAD, no culprit lesion. Elevated cardiac outflow gradient noted, this was evaluated with TEE which was non-diagnostic. Cardiology advises close outpt f/u, they are considering gated ct scan. Patient's ticegralor was discontinued and imdur and diltiazem were added to his regimen. Patient also with lower abdominal pain and mssa bacteremia, treated with ceftriaxone>keflex. A1c in the 11s, patient says pcp is aware, they have a plan in place to address this, thus I did not make any changes to his home regimen. Will need close cardiology and pcp f/u.   Procedures: TEE   Consultations: cardiology  Discharge Exam: Vitals:   09/11/22 0327 09/11/22 0912  BP: 119/74 110/75  Pulse: 76 78  Resp: 18 20  Temp: 98.4 F (36.9 C) 97.7 F (36.5 C)  SpO2: 100% 100%    General: NAD Cardiovascular: rrr Respiratory: normal wob Trace LE edema  Discharge Instructions   Discharge Instructions     Diet - low sodium heart healthy   Complete by: As directed    Increase activity slowly   Complete by: As directed       Allergies as of 09/11/2022   No Known Allergies      Medication List     STOP taking these medications    Brilinta 90 MG Tabs tablet Generic drug: ticagrelor       TAKE these medications    albuterol 108 (90 Base) MCG/ACT inhaler Commonly known as: VENTOLIN HFA Inhale 1-2 puffs into the lungs every 6 (six) hours as needed  for wheezing or shortness of breath.   aspirin EC 81 MG tablet Take 1 tablet (81 mg total) by mouth daily. Swallow whole.   atorvastatin 40 MG tablet Commonly known as: LIPITOR Take 1 tablet (40 mg total) by mouth daily at 6 PM.   cephALEXin 500 MG capsule Commonly known as: KEFLEX Take 1 capsule (500 mg total) by mouth every 12 (twelve) hours.   cyclobenzaprine 10 MG tablet Commonly known as: FLEXERIL Take 1 tablet (10 mg total) by mouth 3 (three) times daily as needed.For spasms   diltiazem  30 MG tablet Commonly known as: CARDIZEM Take 1 tablet (30 mg total) by mouth every 12 (twelve) hours.   DULoxetine 60 MG capsule Commonly known as: CYMBALTA Take 120 mg by mouth daily. What changed: Another medication with the same name was removed. Continue taking this medication, and follow the directions you see here.   famotidine 40 MG tablet Commonly known as: PEPCID Take 40 mg by mouth daily.   fluticasone-salmeterol 250-50 MCG/ACT Aepb Commonly known as: ADVAIR Inhale 1 puff into the lungs every 12 (twelve) hours.   furosemide 20 MG tablet Commonly known as: LASIX Take 1 tablet (20 mg total) by mouth daily as needed (for SOB or weight gain of 3 lbs overnight).   gabapentin 300 MG capsule Commonly known as: NEURONTIN Take 1 capsule by mouth at bedtime.   glipiZIDE 5 MG tablet Commonly known as: GLUCOTROL Take 1 tablet (5 mg total) by mouth 2 (two) times daily before a meal.   glucose blood test strip Commonly known as: GE100 Blood Glucose Test Use as instructed   isosorbide mononitrate 30 MG 24 hr tablet Commonly known as: IMDUR Take 0.5 tablets (15 mg total) by mouth daily.   Jardiance 25 MG Tabs tablet Generic drug: empagliflozin Take 25 mg by mouth daily.   metFORMIN 1000 MG tablet Commonly known as: GLUCOPHAGE Take 1 tablet (1,000 mg total) by mouth 2 (two) times daily with a meal.   metoprolol tartrate 100 MG tablet Commonly known as: LOPRESSOR Take 1 tablet (100 mg total) by mouth 2 (two) times daily.   onetouch ultrasoft lancets Check sugar twice daily   traMADol 50 MG tablet Commonly known as: Ultram Take 1 tablet (50 mg total) by mouth every 6 (six) hours as needed.   traZODone 50 MG tablet Commonly known as: DESYREL Take 50-100 mg by mouth at bedtime.       No Known Allergies  Follow-up Information     [name], MD Follow up.   Specialty: Cardiology Contact information: [address] [place]  [address] [phone]         Ethelda Chick, MD Follow up.   Specialty: Family Medicine Contact information: [address] [place] [address] [phone]                  The results of significant diagnostics from this hospitalization (including imaging, microbiology, ancillary and laboratory) are listed below for reference.    Significant Diagnostic Studies: ECHO TEE  Result Date: 09/10/2022    TRANSESOPHOGEAL ECHO REPORT   Patient Name:   Brett Marshall [facility] Date of Exam: 09/10/2022 Medical Rec #:  [id number]       Height:       68.0 in Accession #:    [id number]      Weight:       318.6 lb Date of Birth:  [birth year]       BSA:  2.491 m Patient Age:    57 years        BP:           106/80 mmHg Patient Gender: M               HR:           77 bpm. Exam Location:  [facility] Procedure: Transesophageal Echo, Cardiac Doppler, Color Doppler and Saline            Contrast Bubble Study Indications:     Not listed on TEE check in sheet  History:         Patient has prior history of Echocardiogram examinations, most                  recent 09/08/2022. CHF, Angina and CAD, Pulmonary HTN,                  Signs/Symptoms:Chest Pain and Dyspnea; Risk                  Factors:Hypertension, Sleep Apnea, Diabetes and Dyslipidemia.  Sonographer:     Mikki Harbor Sonographer#2:   Cristela Blue Referring Phys:  [id number] CHRISTOPHER RONALD BERGE Diagnosing Phys: Julien Nordmann MD PROCEDURE: After discussion of the risks and benefits of a TEE, an informed consent was obtained from the patient. TEE procedure time was 30 minutes. The transesophogeal probe was passed without difficulty through the esophogus of the patient. Imaged were obtained with the patient in a left lateral decubitus position. Sedation performed by different physician. Image quality was good. The patient's vital signs; including heart rate, blood pressure, and oxygen saturation; remained stable throughout the  procedure. The patient  developed no complications during the procedure.  IMPRESSIONS  1. Left ventricular ejection fraction, by estimation, is 60 to 65%. The left ventricle has normal function. The left ventricle has no regional wall motion abnormalities. There is mild concentric LVH with moderate asymmetric left ventricular hypertrophy of the basal-septal segment.  2. Right ventricular systolic function is normal. The right ventricular size is normal.  3. The mitral valve is normal in structure. No evidence of mitral valve regurgitation. No evidence of mitral stenosis.  4. The aortic valve is tricuspid. Select images suggesting mild valve prolapse LCC. There is mild calcification of the aortic valve. Aortic valve regurgitation is moderate. Visually with mild aortic valve stenosis. No subvalvular membrane noted.  5. The inferior vena cava is normal in size with greater than 50% respiratory variability, suggesting right atrial pressure of 3 mmHg.  6. No left atrial/left atrial appendage thrombus was detected.  7. Agitated saline contrast bubble study was negative, with no evidence of any interatrial shunt. Conclusion(s)/Recommendation(s): Normal biventricular function without evidence of hemodynamically significant valvular heart disease. FINDINGS  Left Ventricle: Left ventricular ejection fraction, by estimation, is 60 to 65%. The left ventricle has normal function. The left ventricle has no regional wall motion abnormalities. The left ventricular internal cavity size was normal in size. There is  moderate asymmetric left ventricular hypertrophy of the basal-septal segment. Right Ventricle: The right ventricular size is normal. No increase in right ventricular wall thickness. Right ventricular systolic function is normal. Left Atrium: Left atrial size was normal in size. No left atrial/left atrial appendage thrombus was detected. Right Atrium: Right atrial size was normal in size. Pericardium: There is no evidence of pericardial effusion.  Mitral Valve: The mitral valve is normal in structure. No evidence of mitral valve regurgitation. No evidence of mitral valve  stenosis. Tricuspid Valve: The tricuspid valve is normal in structure. Tricuspid valve regurgitation is not demonstrated. No evidence of tricuspid stenosis. Aortic Valve: The aortic valve is tricuspid. There is mild calcification of the aortic valve. Aortic valve regurgitation is moderate. Mild aortic stenosis is present. Pulmonic Valve: The pulmonic valve was normal in structure. Pulmonic valve regurgitation is not visualized. No evidence of pulmonic stenosis. Aorta: The aortic root is normal in size and structure. Venous: The inferior vena cava is normal in size with greater than 50% respiratory variability, suggesting right atrial pressure of 3 mmHg. IAS/Shunts: No atrial level shunt detected by color flow Doppler. Agitated saline contrast was given intravenously to evaluate for intracardiac shunting. Agitated saline contrast bubble study was negative, with no evidence of any interatrial shunt. Julien Nordmann MD Electronically signed by Julien Nordmann MD Signature Date/Time: 09/10/2022/3:05:49 PM    Final    ECHOCARDIOGRAM COMPLETE  Result Date: 09/09/2022    ECHOCARDIOGRAM REPORT   Patient Name:   Brett Marshall [facility] Date of Exam: 09/08/2022 Medical Rec #:  [id number]       Height:       68.0 in Accession #:    [id number]      Weight:       311.1 lb Date of Birth:  1965/10/18       BSA:          2.466 m Patient Age:    57 years        BP:           116/68 mmHg Patient Gender: M               HR:           122 bpm. Exam Location:  [facility] Procedure: 2D Echo, Cardiac Doppler, Color Doppler and Intracardiac            Opacification Agent Indications:     R07.9 Chest pain  History:         Patient has no prior history of Echocardiogram examinations.                  CAD; Risk Factors:Hypertension, Diabetes and Dyslipidemia.                  Obstructive sleep apnea-CPAP. Pulmonary hypertension.                   Nonischemic cardiomyopathy.  Sonographer:     Sedonia Small Rodgers-Jones RDCS Referring Phys:  [id number] CHRISTOPHER END Diagnosing Phys: Julien Nordmann MD  Sonographer Comments: Technically difficult study due to poor echo windows and patient is obese. IMPRESSIONS  1. Left ventricular ejection fraction, by estimation, is 60 to 65%. The left ventricle has normal function. The left ventricle has no regional wall motion abnormalities. There is mild left ventricular hypertrophy. Left ventricular diastolic parameters are consistent with Grade I diastolic dysfunction (impaired relaxation).  2. Right ventricular systolic function is normal. The right ventricular size is normal.  3. The mitral valve is normal in structure. No evidence of mitral valve regurgitation. No evidence of mitral stenosis.  4. The aortic valve has an indeterminant number of cusps. Aortic valve regurgitation is not visualized. Aortic valve sclerosis/calcification is present, without any evidence of aortic stenosis.  5. The inferior vena cava is normal in size with greater than 50% respiratory variability, suggesting right atrial pressure of 3 mmHg. FINDINGS  Left Ventricle: Left ventricular ejection fraction, by estimation, is 60 to 65%. The left ventricle has normal  function. The left ventricle has no regional wall motion abnormalities. Definity contrast agent was given IV to delineate the left ventricular  endocardial borders. The left ventricular internal cavity size was normal in size. There is mild left ventricular hypertrophy. Left ventricular diastolic parameters are consistent with Grade I diastolic dysfunction (impaired relaxation). Right Ventricle: The right ventricular size is normal. No increase in right ventricular wall thickness. Right ventricular systolic function is normal. Left Atrium: Left atrial size was normal in size. Right Atrium: Right atrial size was normal in size. Pericardium: There is no evidence of pericardial effusion.  Mitral Valve: The mitral valve is normal in structure. No evidence of mitral valve regurgitation. No evidence of mitral valve stenosis. Tricuspid Valve: The tricuspid valve is normal in structure. Tricuspid valve regurgitation is not demonstrated. No evidence of tricuspid stenosis. Aortic Valve: The aortic valve has an indeterminant number of cusps. Aortic valve regurgitation is not visualized. Aortic valve sclerosis/calcification is present, without any evidence of aortic stenosis. Pulmonic Valve: The pulmonic valve was normal in structure. Pulmonic valve regurgitation is not visualized. No evidence of pulmonic stenosis. Aorta: The aortic root is normal in size and structure. Venous: The inferior vena cava is normal in size with greater than 50% respiratory variability, suggesting right atrial pressure of 3 mmHg. IAS/Shunts: No atrial level shunt detected by color flow Doppler.  LEFT VENTRICLE PLAX 2D LVIDd:         3.80 cm   Diastology LVIDs:         2.70 cm   LV e' medial:    6.79 cm/s LV PW:         1.30 cm   LV E/e' medial:  9.8 LV IVS:        1.30 cm   LV e' lateral:   6.94 cm/s LVOT diam:     2.00 cm   LV E/e' lateral: 9.6 LVOT Area:     3.14 cm  RIGHT VENTRICLE RV Basal diam:  4.10 cm RV S prime:     19.35 cm/s TAPSE (M-mode): 1.7 cm LEFT ATRIUM         Index       RIGHT ATRIUM          Index LA diam:    3.60 cm 1.46 cm/m  RA Area:     8.41 cm                                 RA Volume:   18.10 ml 7.34 ml/m   AORTA Ao Root diam: 3.10 cm Ao Asc diam:  3.10 cm MITRAL VALVE MV Area (PHT): 5.90 cm    SHUNTS MV Decel Time: 129 msec    Systemic Diam: 2.00 cm MV E velocity: 66.70 cm/s MV A velocity: 87.40 cm/s MV E/A ratio:  0.76 Julien Nordmann MD Electronically signed by Julien Nordmann MD Signature Date/Time: 09/09/2022/7:49:50 AM    Final    CARDIAC CATHETERIZATION  Result Date: 09/08/2022 Conclusions: Mild-moderate, non-obstructive coronary artery disease.  No culprit lesion identified for the patient's  chest pain. Normal left ventricular filling pressure. Severe left ventricular outflow tract gradient. Recommendations: Add diltiazem for improved heart rate control. Gentle post-catheterization hydration. Obtain echocardiogram and review last year's cardiac MRI with Dr. Azucena Cecil.  May need to consider TEE for further evaluation of LVOT gradient, as I suspect this is driving Mr. Tignor symptoms. Aggressive secondary prevention of coronary artery disease.  Ticagrelor  could be stopped tomorrow, at it will have been 12 months since PCI to ostial/proximal LAD. Yvonne Kendall, MD [facility]  CT ABDOMEN PELVIS W CONTRAST  Result Date: 09/07/2022 CLINICAL DATA:  57 year old male with chest pain. Shortness of breath. Lower abdominal pain. EXAM: CT ABDOMEN AND PELVIS WITH CONTRAST TECHNIQUE: Multidetector CT imaging of the abdomen and pelvis was performed using the standard protocol following bolus administration of intravenous contrast. RADIATION DOSE REDUCTION: This exam was performed according to the departmental dose-optimization program which includes automated exposure control, adjustment of the mA and/or kV according to patient size and/or use of iterative reconstruction technique. CONTRAST:  OMNIPAQUE IOHEXOL 350 MG/ML SOLN COMPARISON:  CTA chest today reported separately. CT Abdomen and Pelvis 04/26/2018. FINDINGS: Lower chest: CTA chest is reported separately today. Hepatobiliary: Negative liver and gallbladder. Pancreas: Negative. Spleen: Negative. Adrenals/Urinary Tract: Normal adrenal glands. Stable and negative kidneys. No nephrolithiasis. Decompressed, symmetric ureters. Unremarkable bladder. Stomach/Bowel: Diverticulosis of the descending and sigmoid colon with no active inflammation. Mostly decompressed large bowel, mild retained stool. Negative terminal ileum. Normal appendix coronal image 48. No dilated small bowel. Decompressed stomach and duodenum. No free air, free fluid, or  mesenteric inflammation identified. Vascular/Lymphatic: Suboptimal intravascular contrast timing but the major arterial structures appear patent and normal. Minimal calcified iliac artery atherosclerosis. Portal venous system appears to be patent. No lymphadenopathy. Reproductive: Negative. Other: No pelvis free fluid.  Incidental pelvic phleboliths. Musculoskeletal: Lower thoracic through upper lumbar chronic interbody ankylosis from bulky flowing endplate osteophytes. This has mildly progressed since 2019. superimposed lower lumbar degeneration. No acute osseous abnormality identified. SI joints remain patent. IMPRESSION: 1. No acute or inflammatory process identified in the abdomen or pelvis. Normal appendix. Diverticulosis of the distal colon without active inflammation. 2. CTA Chest reported separately. 3. Chronic thoracolumbar spinal ankylosis, Diffuse idiopathic skeletal hyperostosis (DISH). Electronically Signed   By: Odessa Fleming M.D.   On: 09/07/2022 10:43   CT Angio Chest PE W and/or Wo Contrast  Result Date: 09/07/2022 CLINICAL DATA:  57 year old male with chest pain. Shortness of breath. Lower abdominal pain. EXAM: CT ANGIOGRAPHY CHEST WITH CONTRAST TECHNIQUE: Multidetector CT imaging of the chest was performed using the standard protocol during bolus administration of intravenous contrast. Multiplanar CT image reconstructions and MIPs were obtained to evaluate the vascular anatomy. RADIATION DOSE REDUCTION: This exam was performed according to the departmental dose-optimization program which includes automated exposure control, adjustment of the mA and/or kV according to patient size and/or use of iterative reconstruction technique. CONTRAST:  OMNIPAQUE IOHEXOL 350 MG/ML SOLN COMPARISON:  Chest radiographs 0818 hours today. CT Abdomen and Pelvis reported separately. Previous chest CTA 09/16/2021. FINDINGS: Cardiovascular: Adequate contrast bolus timing in the pulmonary arterial tree. Mild  respiratory motion. No central or lobar pulmonary artery filling defect. No convincing segmental or distal branch pulmonary artery thrombosis. Stable heart size, no cardiomegaly. Incidental mediastinal lipomatosis. No pericardial effusion. Thoracic aorta appears stable and negative. There is chronic left coronary artery stent. Mediastinum/Nodes: Negative. No mediastinal mass or lymphadenopathy. Lungs/Pleura: Stable lung volumes and mild atelectatic changes to the major airways which remain patent. No pleural effusion or consolidation. No suspicious pulmonary nodule or convincing pulmonary inflammation. Mild chronic scarring/atelectasis in the lingula and right middle lobe. Upper Abdomen: CT Abdomen and Pelvis reported separately today. Musculoskeletal: Widespread chronic thoracic spine interbody ankylosis from flowing endplate osteophytes, Diffuse idiopathic skeletal hyperostosis (DISH). T1-T2 spared as before. No acute osseous abnormality identified. Review of the MIP images confirms the above findings. IMPRESSION:  1. Mildly degraded by respiratory motion. No pulmonary embolus identified. 2. No acute or inflammatory process identified in the Chest. 3. CT Abdomen and Pelvis reported separately. 4. Thoracic spinal ankylosis, Diffuse idiopathic skeletal hyperostosis (DISH). Electronically Signed   By: Odessa Fleming M.D.   On: 09/07/2022 10:38   US Venous Img Lower Bilateral  Result Date: 09/07/2022 CLINICAL DATA:  Lower extremity pain for 2-3 weeks EXAM: BILATERAL LOWER EXTREMITY VENOUS DOPPLER ULTRASOUND TECHNIQUE: Gray-scale sonography with graded compression, as well as color Doppler and duplex ultrasound were performed to evaluate the lower extremity deep venous systems from the level of the common femoral vein and including the common femoral, femoral, profunda femoral, popliteal and calf veins including the posterior tibial, peroneal and gastrocnemius veins when visible. The superficial great saphenous vein was  also interrogated. Spectral Doppler was utilized to evaluate flow at rest and with distal augmentation maneuvers in the common femoral, femoral and popliteal veins. COMPARISON:  None Available. FINDINGS: RIGHT LOWER EXTREMITY Common Femoral Vein: No evidence of thrombus. Normal compressibility, respiratory phasicity and response to augmentation. Saphenofemoral Junction: No evidence of thrombus. Normal compressibility and flow on color Doppler imaging. Profunda Femoral Vein: No evidence of thrombus. Normal compressibility and flow on color Doppler imaging. Femoral Vein: No evidence of thrombus. Normal compressibility, respiratory phasicity and response to augmentation. Popliteal Vein: No evidence of thrombus. Normal compressibility, respiratory phasicity and response to augmentation. Calf Veins: No evidence of thrombus. Normal compressibility and flow on color Doppler imaging. Superficial Great Saphenous Vein: No evidence of thrombus. Normal compressibility. Venous Reflux:  None. Other Findings:  None. LEFT LOWER EXTREMITY Common Femoral Vein: No evidence of thrombus. Normal compressibility, respiratory phasicity and response to augmentation. Saphenofemoral Junction: No evidence of thrombus. Normal compressibility and flow on color Doppler imaging. Profunda Femoral Vein: No evidence of thrombus. Normal compressibility and flow on color Doppler imaging. Femoral Vein: No evidence of thrombus. Normal compressibility, respiratory phasicity and response to augmentation. Popliteal Vein: No evidence of thrombus. Normal compressibility, respiratory phasicity and response to augmentation. Calf Veins: No evidence of thrombus. Normal compressibility and flow on color Doppler imaging. Superficial Great Saphenous Vein: No evidence of thrombus. Normal compressibility. Venous Reflux:  None. Other Findings:  None. IMPRESSION: No evidence of deep venous thrombosis in either lower extremity. Electronically Signed   By: Alcide Clever  M.D.   On: 09/07/2022 10:01   DG Chest 2 View  Result Date: 09/07/2022 CLINICAL DATA:  Chest pain EXAM: CHEST - 2 VIEW COMPARISON:  07/03/2022 FINDINGS: The heart size and mediastinal contours are within normal limits. Both lungs are clear. The visualized skeletal structures are unremarkable except for degenerative changes throughout the spine. IMPRESSION: No active cardiopulmonary disease. Electronically Signed   By: Judie Petit.  Shick M.D.   On: 09/07/2022 08:34    Microbiology: Recent Results (from the past 240 hour(s))  Urine Culture (for pregnant, neutropenic or urologic patients or patients with an indwelling urinary catheter)     Status: Abnormal   Collection Time: 09/07/22  1:08 PM   Specimen: Urine, Clean Catch  Result Value Ref Range Status   Specimen Description   Final    URINE, CLEAN CATCH Performed at [facility], [address], [place], [place] [address]    Special Requests   Final    NONE Performed at [facility], [address] [place], [place] [address]    Culture (A)  Final    >=100,000 COLONIES/mL STAPHYLOCOCCUS AUREUS >=100,000 COLONIES/mL GROUP B STREP(S.AGALACTIAE)ISOLATED TESTING AGAINST  S. AGALACTIAE NOT ROUTINELY PERFORMED DUE TO PREDICTABILITY OF AMP/PEN/VAN SUSCEPTIBILITY. Performed at [facility], [address], [place], [place] [address]    Report Status 09/10/2022 FINAL  Final   Organism ID, Bacteria STAPHYLOCOCCUS AUREUS (A)  Final      Susceptibility   Staphylococcus aureus - MIC*    CIPROFLOXACIN 1 SENSITIVE Sensitive     GENTAMICIN <=0.5 SENSITIVE Sensitive     NITROFURANTOIN <=16 SENSITIVE Sensitive     OXACILLIN <=0.25 SENSITIVE Sensitive     TETRACYCLINE <=1 SENSITIVE Sensitive     VANCOMYCIN 1 SENSITIVE Sensitive     TRIMETH/SULFA <=10 SENSITIVE Sensitive     CLINDAMYCIN RESISTANT Resistant     RIFAMPIN <=0.5 SENSITIVE Sensitive     Inducible Clindamycin POSITIVE Resistant     * >=100,000 COLONIES/mL STAPHYLOCOCCUS  AUREUS     Labs: Basic Metabolic Panel: Recent Labs  Lab 09/07/22 0827 09/08/22 0453 09/10/22 0621 09/11/22 0424  NA 134* 136 137 135  K 3.6 4.1 3.9 3.9  CL 99 102 107 106  CO2 23 23 22 23   GLUCOSE 308* 214* 324* 251*  BUN 19 15 16 15   CREATININE 1.27* 1.23 1.17 1.13  CALCIUM 8.5* 8.6* 8.4* 8.3*   Liver Function Tests: Recent Labs  Lab 09/07/22 0827  AST 21  ALT 18  ALKPHOS 125  BILITOT 0.7  PROT 7.3  ALBUMIN 3.1*   Recent Labs  Lab 09/07/22 0827  LIPASE 22   No results for input(s): "AMMONIA" in the last 168 hours. CBC: Recent Labs  Lab 09/07/22 1622 09/07/22 2126 09/08/22 0453 09/10/22 0621 09/11/22 0424  WBC 9.9 8.7 8.4 7.1 7.9  HGB 13.1 12.4* 12.8* 12.5* 11.7*  HCT 39.6 37.7* 39.0 38.9* 35.9*  MCV 84.1 84.3 84.6 85.3 84.3  PLT 231 220 218 187 192   Cardiac Enzymes: Recent Labs  Lab 09/07/22 0827  CKTOTAL 38*   BNP: BNP (last 3 results) Recent Labs    09/16/21 0947 09/17/21 0928 09/07/22 0827  BNP 22.4 26.4 39.4    ProBNP (last 3 results) No results for input(s): "PROBNP" in the last 8760 hours.  CBG: Recent Labs  Lab 09/10/22 1143 09/10/22 1340 09/10/22 1626 09/10/22 2106 09/11/22 0836  GLUCAP 194* 163* 273* 265* 245*       Signed:  Silvano Bilis MD.  [facility] 09/11/2022, 9:36 AM

## 2022-09-11 NOTE — Progress Notes (Signed)
Rounding Note    Patient Name: Brett Marshall Date of Encounter: 09/11/2022  [facility] Cardiologist: Lorine Bears, MD   Subjective   No events overnight Denies significant chest pain Transesophageal echo yesterday, mild aortic valve stenosis, moderate AI, normal left ventricular ejection fraction No clear reason identified for left ventricular outflow tract gradient noted on catheterization Tolerating diltiazem 30 mg with isosorbide 15, metoprolol 25 twice daily  Inpatient Medications    Scheduled Meds:  aspirin EC  81 mg Oral Daily   atorvastatin  40 mg Oral q1800   cephALEXin  500 mg Oral Q12H   diltiazem  30 mg Oral Q12H   DULoxetine  120 mg Oral Daily   enoxaparin (LOVENOX) injection  0.5 mg/kg Subcutaneous Q24H   insulin aspart  0-5 Units Subcutaneous QHS   insulin aspart  0-9 Units Subcutaneous TID WC   insulin glargine-yfgn  18 Units Subcutaneous Daily   insulin glargine-yfgn  8 Units Subcutaneous Once   isosorbide mononitrate  15 mg Oral Daily   metoprolol tartrate  100 mg Oral BID   mometasone-formoterol  2 puff Inhalation BID   pantoprazole  40 mg Oral BID   polyethylene glycol  17 g Oral Daily   sodium chloride flush  3 mL Intravenous Q12H   Continuous Infusions:  sodium chloride     PRN Meds: sodium chloride, acetaminophen, albuterol, cyclobenzaprine, dextromethorphan-guaiFENesin, hydrALAZINE, morphine injection, nitroGLYCERIN, ondansetron (ZOFRAN) IV, oxyCODONE-acetaminophen, sodium chloride flush, traZODone   Vital Signs    Vitals:   09/10/22 1923 09/10/22 2307 09/11/22 0327 09/11/22 0912  BP: 102/66 109/64 119/74 110/75  Pulse: 83 84 76 78  Resp: 18 16 18 20   Temp: 97.7 F (36.5 C) 97.8 F (36.6 C) 98.4 F (36.9 C) 97.7 F (36.5 C)  TempSrc: Oral Oral Oral Oral  SpO2: 100% 98% 100% 100%  Weight:   (!) 144.2 kg     Intake/Output Summary (Last 24 hours) at 09/11/2022 1552 Last data filed at 09/11/2022 1050 Gross per 24 hour   Intake 1200 ml  Output --  Net 1200 ml      09/11/2022    3:27 AM 09/10/2022    3:26 AM 09/09/2022    4:34 AM  Last 3 Weights  Weight (lbs) 317 lb 14.4 oz 318 lb 9 oz 315 lb 6.4 oz  Weight (kg) 144.198 kg 144.5 kg 143.065 kg      Telemetry    Normal sinus rhythm- Personally Reviewed  ECG     - Personally Reviewed  Physical Exam   GEN: No acute distress.  Obese Neck: No JVD Cardiac: RRR, no murmurs, rubs, or gallops.  Respiratory: Clear to auscultation bilaterally. GI: Soft, nontender, non-distended  MS: No edema; No deformity. Neuro:  Nonfocal  Psych: Normal affect   Labs    High Sensitivity Troponin:   Recent Labs  Lab 09/07/22 0827 09/07/22 1015 09/07/22 1308 09/07/22 1622 09/07/22 2126  TROPONINIHS 6 5 5 4 5      Chemistry Recent Labs  Lab 09/07/22 0827 09/08/22 0453 09/10/22 0621 09/11/22 0424  NA 134* 136 137 135  K 3.6 4.1 3.9 3.9  CL 99 102 107 106  CO2 23 23 22 23   GLUCOSE 308* 214* 324* 251*  BUN 19 15 16 15   CREATININE 1.27* 1.23 1.17 1.13  CALCIUM 8.5* 8.6* 8.4* 8.3*  PROT 7.3  --   --   --   ALBUMIN 3.1*  --   --   --   AST  21  --   --   --   ALT 18  --   --   --   ALKPHOS 125  --   --   --   BILITOT 0.7  --   --   --   GFRNONAA >60 >60 >60 >60  ANIONGAP 12 11 8 6     Lipids  Recent Labs  Lab 09/08/22 0453  CHOL 109  TRIG 138  HDL 31*  LDLCALC 50  CHOLHDL 3.5    Hematology Recent Labs  Lab 09/08/22 0453 09/10/22 0621 09/11/22 0424  WBC 8.4 7.1 7.9  RBC 4.61 4.56 4.26  HGB 12.8* 12.5* 11.7*  HCT 39.0 38.9* 35.9*  MCV 84.6 85.3 84.3  MCH 27.8 27.4 27.5  MCHC 32.8 32.1 32.6  RDW 14.1 14.0 14.2  PLT 218 187 192   Thyroid No results for input(s): "TSH", "FREET4" in the last 168 hours.  BNP Recent Labs  Lab 09/07/22 0827  BNP 39.4    DDimer No results for input(s): "DDIMER" in the last 168 hours.   Radiology    ECHO TEE  Result Date: 09/10/2022    TRANSESOPHOGEAL ECHO REPORT   Patient Name:   Brett Marshall  [facility] Date of Exam: 09/10/2022 Medical Rec #:  [id number]       Height:       68.0 in Accession #:    [id number]      Weight:       318.6 lb Date of Birth:  1965/08/21       BSA:          2.491 m Patient Age:    57 years        BP:           106/80 mmHg Patient Gender: M               HR:           77 bpm. Exam Location:  [facility] Procedure: Transesophageal Echo, Cardiac Doppler, Color Doppler and Saline            Contrast Bubble Study Indications:     Not listed on TEE check in sheet  History:         Patient has prior history of Echocardiogram examinations, most                  recent 09/08/2022. CHF, Angina and CAD, Pulmonary HTN,                  Signs/Symptoms:Chest Pain and Dyspnea; Risk                  Factors:Hypertension, Sleep Apnea, Diabetes and Dyslipidemia.  Sonographer:     Mikki Harbor Sonographer#2:   Cristela Blue Referring Phys:  [id number] CHRISTOPHER RONALD BERGE Diagnosing Phys: Julien Nordmann MD PROCEDURE: After discussion of the risks and benefits of a TEE, an informed consent was obtained from the patient. TEE procedure time was 30 minutes. The transesophogeal probe was passed without difficulty through the esophogus of the patient. Imaged were obtained with the patient in a left lateral decubitus position. Sedation performed by different physician. Image quality was good. The patient's vital signs; including heart rate, blood pressure, and oxygen saturation; remained stable throughout the  procedure. The patient developed no complications during the procedure.  IMPRESSIONS  1. Left ventricular ejection fraction, by estimation, is 60 to 65%. The left ventricle has normal function. The left ventricle has no regional wall motion abnormalities.  There is mild concentric LVH with moderate asymmetric left ventricular hypertrophy of the basal-septal segment.  2. Right ventricular systolic function is normal. The right ventricular size is normal.  3. The mitral valve is normal in structure. No evidence of  mitral valve regurgitation. No evidence of mitral stenosis.  4. The aortic valve is tricuspid. Select images suggesting mild valve prolapse LCC. There is mild calcification of the aortic valve. Aortic valve regurgitation is moderate. Visually with mild aortic valve stenosis. No subvalvular membrane noted.  5. The inferior vena cava is normal in size with greater than 50% respiratory variability, suggesting right atrial pressure of 3 mmHg.  6. No left atrial/left atrial appendage thrombus was detected.  7. Agitated saline contrast bubble study was negative, with no evidence of any interatrial shunt. Conclusion(s)/Recommendation(s): Normal biventricular function without evidence of hemodynamically significant valvular heart disease. FINDINGS  Left Ventricle: Left ventricular ejection fraction, by estimation, is 60 to 65%. The left ventricle has normal function. The left ventricle has no regional wall motion abnormalities. The left ventricular internal cavity size was normal in size. There is  moderate asymmetric left ventricular hypertrophy of the basal-septal segment. Right Ventricle: The right ventricular size is normal. No increase in right ventricular wall thickness. Right ventricular systolic function is normal. Left Atrium: Left atrial size was normal in size. No left atrial/left atrial appendage thrombus was detected. Right Atrium: Right atrial size was normal in size. Pericardium: There is no evidence of pericardial effusion. Mitral Valve: The mitral valve is normal in structure. No evidence of mitral valve regurgitation. No evidence of mitral valve stenosis. Tricuspid Valve: The tricuspid valve is normal in structure. Tricuspid valve regurgitation is not demonstrated. No evidence of tricuspid stenosis. Aortic Valve: The aortic valve is tricuspid. There is mild calcification of the aortic valve. Aortic valve regurgitation is moderate. Mild aortic stenosis is present. Pulmonic Valve: The pulmonic valve was  normal in structure. Pulmonic valve regurgitation is not visualized. No evidence of pulmonic stenosis. Aorta: The aortic root is normal in size and structure. Venous: The inferior vena cava is normal in size with greater than 50% respiratory variability, suggesting right atrial pressure of 3 mmHg. IAS/Shunts: No atrial level shunt detected by color flow Doppler. Agitated saline contrast was given intravenously to evaluate for intracardiac shunting. Agitated saline contrast bubble study was negative, with no evidence of any interatrial shunt. Julien Nordmann MD Electronically signed by Julien Nordmann MD Signature Date/Time: 09/10/2022/3:05:49 PM    Final     Cardiac Studies   TEE  1. Left ventricular ejection fraction, by estimation, is 60 to 65%. The  left ventricle has normal function. The left ventricle has no regional  wall motion abnormalities. There is mild concentric LVH with moderate  asymmetric left ventricular hypertrophy  of the basal-septal segment.   2. Right ventricular systolic function is normal. The right ventricular  size is normal.   3. The mitral valve is normal in structure. No evidence of mitral valve  regurgitation. No evidence of mitral stenosis.   4. The aortic valve is tricuspid. Select images suggesting mild valve  prolapse LCC. There is mild calcification of the aortic valve. Aortic  valve regurgitation is moderate. Visually with mild aortic valve stenosis.  No subvalvular membrane noted.   5. The inferior vena cava is normal in size with greater than 50%  respiratory variability, suggesting right atrial pressure of 3 mmHg.   6. No left atrial/left atrial appendage thrombus was detected.   7. Agitated saline  contrast bubble study was negative, with no evidence  of any interatrial shunt.   Patient Profile     57 y.o. 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 Myriam Forehand Transesophageal echo performed yesterday, with general anesthesia No clear visualization of subaortic membrane  Basal septal wall thickening appreciated, moderate Etiology of outflow tract gradient unclear, discussed with team,  Recommendation made to consider gated outpatient cardiac CTA   Chronic diastolic CHF Ejection fraction 60% Appears euvolemic   Diabetes type 2 with complications, poorly controlled A1c 11.7 Additional medications per medicine service Weight reduction recommended   Morbid obesity We have encouraged continued exercise, careful diet management in an effort to lose weight.   Total encounter time more than 35 minutes  Greater than 50% was spent in counseling and coordination of care with the patient   For questions or updates, please contact [facility] Please consult [email or web address] for contact info under        Signed, Julien Nordmann, MD  09/11/2022, 3:52 PM

## 2022-09-11 NOTE — Consult Note (Signed)
   [facility] CM Inpatient Consult   09/11/2022  Brett Marshall Dec 24, 1965 [id number]     Location: [facility] RN Hospital Liaison screened remotely [facility]).   [facility] Customer service manager [facility]) Accountable Care Organization [ACO] Patient: Civil engineer, contracting)    Primary Care Provider:  Ethelda Chick, MD [facility] Primary Care NON [facility] PROVIDER Last office visit with this provided noted via [facility] 09/04/2022.  Patient screened for readmission hospitalization with noted medium risk score for unplanned readmission risk with 1 IP/2 ED in 6 months. [facility]/Population Health RN liaison will assess for potential [facility]) Care Management service needs for post hospital transition for care coordination. NON [facility] PROVIDER   [facility] Care Management/Population Health does not replace or interfere with any arrangements made by the Inpatient Transition of Care team.   For questions contact:     Elliot Cousin, RN, BSN [facility] Liaison [place]   [facility]  Population Health Office Hours MTWF 8:00 am to 6 pm off on Thursday [phone] mobile [phone] [Office toll free line][facility] Office Hours are M-F 8:30 - 5 pm 24 hour nurse advise line [phone] Conceirge  [name]

## 2022-09-11 NOTE — Progress Notes (Signed)
Discharge instructions, med list, written info [id number] and post cath care reviewed with pt and SO.  Both verbalized understanding.  Tele and IV removed.  Pt to follow up with PCP and cardiology.  Discharged via wheelchair with volunteer services without incident

## 2022-09-14 ENCOUNTER — Telehealth: Payer: Self-pay | Admitting: *Deleted

## 2022-09-14 DIAGNOSIS — R072 Precordial pain: Secondary | ICD-10-CM

## 2022-09-14 DIAGNOSIS — J4520 Mild intermittent asthma, uncomplicated: Secondary | ICD-10-CM | POA: Insufficient documentation

## 2022-09-14 NOTE — Telephone Encounter (Signed)
Reviewed the patient's chart.  The current order is for a Cardiac CT Pulm Vein.  Reviewed with Cadence Furth, PA that this is the incorrect order and will need to change to a regular Cardiac CTA.  Order changed.    MyChart message sent to the patient with instructions.   Will follow chart to insure his testing is scheduled.

## 2022-09-14 NOTE — Telephone Encounter (Signed)
-----   Message from Cadence David Stall, PA-C sent at 09/11/2022  2:01 PM EDT ----- Regarding: hosp follow-up study Hey, so this patient needs a gated Cardiac CTA study. I placed the order, can it please be scheduled? Thanks

## 2022-09-15 ENCOUNTER — Other Ambulatory Visit: Payer: Self-pay | Admitting: *Deleted

## 2022-09-15 MED ORDER — IVABRADINE HCL 7.5 MG PO TABS: ORAL_TABLET | ORAL | 0 refills | Status: DC

## 2022-09-15 NOTE — Telephone Encounter (Signed)
The patient responded back through his MyChart that he received his Cardiac CT instructions. He is currently on metoprolol tartrate 100 mg BID. I did send in Corlanor 7.5 mg- take 2 tablets (15 mg) 2 hours prior to his Cardiac CT to [facility] in [place]. HR's are running ~ 80 bpm on metoprolol tartrate.   The patient advised he understood the instructions and that he would be contacted for the scan.

## 2022-09-17 ENCOUNTER — Telehealth: Payer: Self-pay | Admitting: Cardiovascular Disease

## 2022-09-17 NOTE — Telephone Encounter (Signed)
The patient was calling to see if he could get a sooner appointment for his cardiac ct. Message has been sent to the scheduler.  Appointment moved up to 6/3.

## 2022-09-17 NOTE — Telephone Encounter (Signed)
Patient is requesting call back to discuss CT that was ordered for him. He believes that a June appt may be too far out. Requesting call back. Please advise.

## 2022-09-18 ENCOUNTER — Telehealth: Payer: Self-pay | Admitting: Cardiovascular Disease

## 2022-09-18 NOTE — Telephone Encounter (Signed)
See MyChart message

## 2022-09-18 NOTE — Telephone Encounter (Signed)
Pt states he needs to get fmla paperwork to Dr. Kirke Corin today before he goes to work on Monday. He does not have access to a fax machine and is unable to come drop it off. He is requesting it be sent via email. Please advise,

## 2022-09-21 DIAGNOSIS — Z0279 Encounter for issue of other medical certificate: Secondary | ICD-10-CM

## 2022-09-22 ENCOUNTER — Telehealth: Payer: Self-pay | Admitting: Nurse Practitioner

## 2022-09-22 ENCOUNTER — Ambulatory Visit: Payer: BLUE CROSS/BLUE SHIELD | Attending: Cardiovascular Disease | Admitting: Cardiovascular Disease

## 2022-09-22 ENCOUNTER — Encounter: Payer: Self-pay | Admitting: Cardiovascular Disease

## 2022-09-22 VITALS — BP 120/60 | HR 97 | Ht 68.0 in | Wt 318.1 lb

## 2022-09-22 DIAGNOSIS — I2510 Atherosclerotic heart disease of native coronary artery without angina pectoris: Secondary | ICD-10-CM | POA: Diagnosis not present

## 2022-09-22 DIAGNOSIS — I10 Essential (primary) hypertension: Secondary | ICD-10-CM | POA: Diagnosis not present

## 2022-09-22 DIAGNOSIS — E782 Mixed hyperlipidemia: Secondary | ICD-10-CM | POA: Diagnosis not present

## 2022-09-22 DIAGNOSIS — I359 Nonrheumatic aortic valve disorder, unspecified: Secondary | ICD-10-CM

## 2022-09-22 DIAGNOSIS — E785 Hyperlipidemia, unspecified: Secondary | ICD-10-CM

## 2022-09-22 NOTE — Progress Notes (Unsigned)
Cardiology Office Note   Date:  09/22/2022   ID:  Brett Marshall, DOB 08/15/1965, MRN [id number]  PCP:  Ethelda Chick, MD  Cardiologist:   Lorine Bears, MD   Chief Complaint  Patient presents with   Follow-up    Post cath/ED Angina c/o chest pain worse with exertion. Meds reviewed verbally with pt.     History of Present Illness: Brett Marshall is a 57 y.o. male who is here today for a follow-up visit regarding coronary artery disease, aortic valve disease and suspected hypertrophic cardiomyopathy.   The patient has multiple chronic medical conditions that include type 2 diabetes, essential hypertension, morbid obesity, obstructive sleep apnea on CPAP and degenerative disc disease.   Right and left cardiac catheterization done in April of 2019 showed mild nonobstructive coronary artery disease, mildly reduced LV systolic function with an EF of 45 to 50% with global hypokinesis.  Right heart catheterization showed moderately elevated filling pressures with a wedge pressure of 22 mmHg, mild pulmonary hypertension and high cardiac output.    He had a repeat echocardiogram done in September 2021 which showed an EF of 60 to 65% with grade 1 diastolic dysfunction.  He was hospitalized in April, 2023 with unstable angina.  Lexiscan Myoview showed evidence of anterior wall ischemia.  I proceeded with cardiac catheterization via the right ulnar artery which showed 95% ostial LAD stenosis due to plaque rupture and moderate proximal RCA disease that was stable from before.  LVEDP was mildly elevated.  There was moderate LVOT/aortic gradient of 25 to 30 mmHg.  I performed successful IVUS guided PCI and drug-eluting stent placement to the ostial LAD.   He returned in May, 2023 with recurrent anginal symptoms.  He underwent repeat cardiac catheterization which showed patent LAD stent with no significant restenosis.  LVEDP was in the high normal range but there was significant LVOT gradient up to  80 mmHg at rest and 110 post PVC.  Based on this, carvedilol and enalapril were discontinued and the patient was started on metoprolol.    He was hospitalized recently at [facility] with recurrent chest pain and shortness of breath.  He underwent cardiac catheterization which showed widely patent LAD stent but again he was noted to have significant systolic gradient across the LVOT/aortic valve at 70 mmHg.  There was a concern about possible subaortic membrane.  The patient's transthoracic echo images were extremely limited.  He underwent a transesophageal echocardiogram during the admission which showed moderate basal septal hypertrophy with no clear LVOT obstruction.  The aortic valve was noted to be calcified and likely bicuspid.  The opening of the valve was severely restricted at the tips.  He reports continued symptoms.  He went back to work on Monday but then felt limited again and could not go back to work today.  Past Medical History:  Diagnosis Date   Asthma    CAD (coronary artery disease)    a. 08/2017 Cath: Diff nonobs dzs, EF 45-50%, diff HK; b. 08/2021 MV: Ant/apical ishemia; c. 08/2021 PCI: LAD 95ost (4.0x15 Onyx Frontier DES), EF 55-65%; c. 09/2021 Cath: LM nl, LAD patent stent, 61m, 30d, RI min irregs, LCX min irregs, RCA 50p. LVOT grad 50-42mmHg (rest), 70-192mmHg (provoked).   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 08/2017 LV gram: EF 45-50%, glob HK; b. 08/2021 Echo: EF 60-65%, no rwma, GrI DD, nl RV fxn; c. 09/2021 cMRI: EF 60%, no LVOT obstruction/gradient. Mild conc LVH, mild AS. No myocardial scar/fibrosis. No evidence of HCM.   Hyperlipidemia    Leg swelling    Left   Low back pain    Mixed ICM & NICM    a. 08/2017 LV gram: EF 45-50%, glob HK; b. 08/2021 Echo: EF 60-65%.   Morbid obesity (HCC)    OSA on  CPAP    non compliant   Pulmonary hypertension (HCC)     Past Surgical History:  Procedure Laterality Date   CARDIAC CATHETERIZATION     [place] no stents.   COLONOSCOPY WITH PROPOFOL N/A 04/11/2019   Procedure: COLONOSCOPY WITH PROPOFOL;  Surgeon: Midge Minium, MD;  Location: [facility] ENDOSCOPY;  Service: Endoscopy;  Laterality: N/A;   CORONARY STENT INTERVENTION N/A 09/08/2021   Procedure: CORONARY STENT INTERVENTION;  Surgeon: [name], MD;  Location: [facility] INVASIVE CV LAB;  Service: Cardiovascular;  Laterality: N/A;   CORONARY ULTRASOUND/IVUS N/A 09/08/2021   Procedure: Intravascular Ultrasound/IVUS;  Surgeon: [name], MD;  Location: [facility] INVASIVE CV LAB;  Service: Cardiovascular;  Laterality: N/A;   LEFT HEART CATH AND CORONARY ANGIOGRAPHY N/A 09/08/2021   Procedure: LEFT HEART CATH AND CORONARY ANGIOGRAPHY;  Surgeon: [name], MD;  Location: [facility] INVASIVE CV LAB;  Service: Cardiovascular;  Laterality: N/A;   LEFT HEART CATH AND CORONARY ANGIOGRAPHY N/A 09/17/2021   Procedure: LEFT HEART CATH AND CORONARY ANGIOGRAPHY;  Surgeon: Yvonne Kendall, MD;  Location: [facility] INVASIVE CV LAB;  Service: Cardiovascular;  Laterality: N/A;   LEFT HEART CATH AND CORONARY ANGIOGRAPHY N/A 09/08/2022   Procedure: LEFT HEART CATH AND CORONARY ANGIOGRAPHY;  Surgeon: Yvonne Kendall, MD;  Location: [facility] INVASIVE CV LAB;  Service: Cardiovascular;  Laterality: N/A;   RIGHT/LEFT HEART CATH AND CORONARY ANGIOGRAPHY N/A 09/02/2017   Procedure: RIGHT/LEFT HEART CATH AND CORONARY ANGIOGRAPHY;  Surgeon: [name], MD;  Location: [facility] INVASIVE CV LAB;  Service: Cardiovascular;  Laterality: N/A;   TEE WITHOUT CARDIOVERSION N/A 09/10/2022   Procedure: TRANSESOPHAGEAL ECHOCARDIOGRAM;  Surgeon: Antonieta Iba, MD;  Location: [facility] ORS;  Service: Cardiovascular;  Laterality: N/A;     Current Outpatient Medications  Medication Sig Dispense Refill   albuterol (VENTOLIN HFA) 108 (90 Base)  MCG/ACT inhaler Inhale 1-2 puffs into the lungs every 6 (six) hours as needed for wheezing or shortness of breath. 18 g 1   aspirin EC 81 MG tablet Take 1 tablet (81 mg total) by mouth daily. Swallow whole. 30 tablet 6   atorvastatin (LIPITOR) 40 MG tablet Take 1 tablet (40 mg total) by mouth daily at 6 PM. 30 tablet 2   cephALEXin (KEFLEX) 500 MG capsule Take 1 capsule (500 mg total) by mouth every 12 (twelve) hours. 5 capsule 0   cyclobenzaprine (FLEXERIL) 10 MG tablet Take 1 tablet (10 mg total) by mouth 3 (three) times daily as needed.For spasms 90 tablet 5   diltiazem (CARDIZEM) 30 MG tablet Take 1 tablet (30 mg total) by mouth every 12 (twelve) hours. 60 tablet 1   DULoxetine (CYMBALTA) 60 MG capsule Take 120 mg by mouth daily.     famotidine (PEPCID) 40 MG tablet Take 40 mg by mouth daily.      fluticasone-salmeterol (ADVAIR) 250-50 MCG/ACT AEPB Inhale 1 puff into the lungs every 12 (twelve) hours. 60 each 6   furosemide (LASIX) 20 MG  tablet Take 1 tablet (20 mg total) by mouth daily as needed (for SOB or weight gain of 3 lbs overnight). 90 tablet 3   gabapentin (NEURONTIN) 300 MG capsule Take 1 capsule by mouth at bedtime.     glipiZIDE (GLUCOTROL) 5 MG tablet Take 1 tablet (5 mg total) by mouth 2 (two) times daily before a meal. 180 tablet 1   glucose blood (GE100 BLOOD GLUCOSE TEST) test strip Use as instructed 100 each 12   isosorbide mononitrate (IMDUR) 30 MG 24 hr tablet Take 0.5 tablets (15 mg total) by mouth daily. 30 tablet 1   ivabradine (CORLANOR) 7.5 MG TABS tablet Take 2 tablets (15 mg) by mouth 2 hours prior to your Cardiac CT x 1 dose 2 tablet 0   JARDIANCE 25 MG TABS tablet Take 25 mg by mouth daily.     Lancets (ONETOUCH ULTRASOFT) lancets Check sugar twice daily 100 each 12   metFORMIN (GLUCOPHAGE) 1000 MG tablet Take 1 tablet (1,000 mg total) by mouth 2 (two) times daily with a meal. 180 tablet 1   metoprolol tartrate (LOPRESSOR) 100 MG tablet Take 1 tablet (100 mg total)  by mouth 2 (two) times daily. 60 tablet 2   traMADol (ULTRAM) 50 MG tablet Take 1 tablet (50 mg total) by mouth every 6 (six) hours as needed. 20 tablet 0   traZODone (DESYREL) 50 MG tablet Take 50-100 mg by mouth at bedtime.     No current facility-administered medications for this visit.    Allergies:   Patient has no known allergies.    Social History:  The patient  reports that he has never smoked. He has never used smokeless tobacco. He reports current alcohol use. He reports that he does not use drugs.   Family History:  The patient's family history includes Asthma in his mother; Diabetes in his mother; Heart attack in his father; Heart disease in his father; Heart disease (age of onset: 32) in his brother; Sjogren's syndrome in his sister.    ROS:  Please see the history of present illness.   Otherwise, review of systems are positive for none.   All other systems are reviewed and negative.   PHYSICAL EXAM: VS:  BP 120/60 (BP Location: Left Arm, Patient Position: Sitting, Cuff Size: Large)   Pulse 97   Ht 5\' 8"  (1.727 m)   Wt (!) 318 lb 2 oz (144.3 kg)   SpO2 98%   BMI 48.37 kg/m  , BMI Body mass index is 48.37 kg/m. GEN: Well nourished, well developed, in no acute distress  HEENT: normal  Neck: no JVD or masses.  No carotid bruits. Cardiac: RRR; norubs, or gallops.  1/6 systolic murmur at the aortic area.  Mild bilateral leg edema. Respiratory:  clear to auscultation bilaterally, normal work of breathing GI: soft, nontender, nondistended, + BS MS: no deformity or atrophy  Skin: warm and dry, no rash Neuro:  Strength and sensation are intact Psych: euthymic mood, full affect Right ulnar pulses normal.  No hematoma.  EKG:  EKG is ordered today. The ekg ordered today demonstrates normal sinus rhythm with low voltage.   Recent Labs: 02/19/2022: Magnesium 1.8 09/07/2022: ALT 18; B Natriuretic Peptide 39.4 09/11/2022: BUN 15; Creatinine, Ser 1.13; Hemoglobin 11.7; Platelets  192; Potassium 3.9; Sodium 135    Lipid Panel    Component Value Date/Time   CHOL 109 09/08/2022 0453   CHOL 176 12/08/2017 1150   TRIG 138 09/08/2022 0453   HDL 31 (L)  09/08/2022 0453   HDL 44 12/08/2017 1150   CHOLHDL 3.5 09/08/2022 0453   VLDL 28 09/08/2022 0453   LDLCALC 50 09/08/2022 0453   LDLCALC 103 (H) 12/08/2017 1150      Wt Readings from Last 3 Encounters:  09/22/22 (!) 318 lb 2 oz (144.3 kg)  09/11/22 (!) 317 lb 14.4 oz (144.2 kg)  06/30/22 (!) 330 lb (149.7 kg)        08/27/2017   11:43 AM  PAD Screen  Previous PAD dx? No  Previous surgical procedure? No  Pain with walking? Yes  Subsides with rest? No  Feet/toe relief with dangling? Yes  Painful, non-healing ulcers? No  Extremities discolored? Yes      ASSESSMENT AND PLAN:  1.  Coronary artery disease involving native coronary arteries: Recent cardiac catheterization showed patent LAD stent with no significant restenosis and no evidence of obstructive disease.  Continue low-dose aspirin.  2.  Aortic valve disease: The patient is known to have severe gradient across the LVOT/aortic valve on cardiac catheterization.  His transthoracic echo images have been always very limited due to his BMI.  He did have a recent transesophageal echocardiogram and these images were personally reviewed by me.  In addition, I asked Dr. Eden Emms and Dr. Donnamarie Poag to review the images and the census is that the issue is likely related to aortic valve and not LVOT obstruction.  The aortic valve is likely bicuspid with severe stenosis and moderate regurgitation.  The patient is scheduled to get cardiac CTA TAVR protocol but most likely he will require surgery to replace the valve.  I am going to refer him to Dr. Leafy Ro for evaluation.  3.   Essential hypertension: Blood pressure is controlled.   4.  Hyperlipidemia: Continue atorvastatin with a target LDL of less than 70.  5.  Morbid obesity: Continue healthy lifestyle  changes.  6.  FMLA: The patient has been limited by his symptoms and he will require intermittently likely until his aortic valve etiology is addressed.    Disposition:   Follow-up in 6 weeks.  Signed,  Lorine Bears, MD  09/22/2022 4:48 PM    [facility]

## 2022-09-22 NOTE — H&P (View-Only) (Signed)
Cardiology Office Note   Date:  09/22/2022   ID:  Brett Marshall, DOB 10-May-1966, MRN [id number]  PCP:  Ethelda Chick, MD  Cardiologist:   Lorine Bears, MD   Chief Complaint  Patient presents with   Follow-up    Post cath/ED Angina c/o chest pain worse with exertion. Meds reviewed verbally with pt.     History of Present Illness: Brett Marshall is a 57 y.o. male who is here today for a follow-up visit regarding coronary artery disease, aortic valve disease and suspected hypertrophic cardiomyopathy.   The patient has multiple chronic medical conditions that include type 2 diabetes, essential hypertension, morbid obesity, obstructive sleep apnea on CPAP and degenerative disc disease.   Right and left cardiac catheterization done in April of 2019 showed mild nonobstructive coronary artery disease, mildly reduced LV systolic function with an EF of 45 to 50% with global hypokinesis.  Right heart catheterization showed moderately elevated filling pressures with a wedge pressure of 22 mmHg, mild pulmonary hypertension and high cardiac output.    He had a repeat echocardiogram done in September 2021 which showed an EF of 60 to 65% with grade 1 diastolic dysfunction.  He was hospitalized in April, 2023 with unstable angina.  Lexiscan Myoview showed evidence of anterior wall ischemia.  I proceeded with cardiac catheterization via the right ulnar artery which showed 95% ostial LAD stenosis due to plaque rupture and moderate proximal RCA disease that was stable from before.  LVEDP was mildly elevated.  There was moderate LVOT/aortic gradient of 25 to 30 mmHg.  I performed successful IVUS guided PCI and drug-eluting stent placement to the ostial LAD.   He returned in May, 2023 with recurrent anginal symptoms.  He underwent repeat cardiac catheterization which showed patent LAD stent with no significant restenosis.  LVEDP was in the high normal range but there was significant LVOT gradient up to  80 mmHg at rest and 110 post PVC.  Based on this, carvedilol and enalapril were discontinued and the patient was started on metoprolol.    He was hospitalized recently at [facility] with recurrent chest pain and shortness of breath.  He underwent cardiac catheterization which showed widely patent LAD stent but again he was noted to have significant systolic gradient across the LVOT/aortic valve at 70 mmHg.  There was a concern about possible subaortic membrane.  The patient's transthoracic echo images were extremely limited.  He underwent a transesophageal echocardiogram during the admission which showed moderate basal septal hypertrophy with no clear LVOT obstruction.  The aortic valve was noted to be calcified and likely bicuspid.  The opening of the valve was severely restricted at the tips.  He reports continued symptoms.  He went back to work on Monday but then felt limited again and could not go back to work today.  Past Medical History:  Diagnosis Date   Asthma    CAD (coronary artery disease)    a. 08/2017 Cath: Diff nonobs dzs, EF 45-50%, diff HK; b. 08/2021 MV: Ant/apical ishemia; c. 08/2021 PCI: LAD 95ost (4.0x15 Onyx Frontier DES), EF 55-65%; c. 09/2021 Cath: LM nl, LAD patent stent, 48m, 30d, RI min irregs, LCX min irregs, RCA 50p. LVOT grad 50-55mmHg (rest), 70-124mmHg (provoked).   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 08/2017 LV gram: EF 45-50%, glob HK; b. 08/2021 Echo: EF 60-65%, no rwma, GrI DD, nl RV fxn; c. 09/2021 cMRI: EF 60%, no LVOT obstruction/gradient. Mild conc LVH, mild AS. No myocardial scar/fibrosis. No evidence of HCM.   Hyperlipidemia    Leg swelling    Left   Low back pain    Mixed ICM & NICM    a. 08/2017 LV gram: EF 45-50%, glob HK; b. 08/2021 Echo: EF 60-65%.   Morbid obesity (HCC)    OSA on  CPAP    non compliant   Pulmonary hypertension (HCC)     Past Surgical History:  Procedure Laterality Date   CARDIAC CATHETERIZATION     [place] no stents.   COLONOSCOPY WITH PROPOFOL N/A 04/11/2019   Procedure: COLONOSCOPY WITH PROPOFOL;  Surgeon: Midge Minium, MD;  Location: [facility] ENDOSCOPY;  Service: Endoscopy;  Laterality: N/A;   CORONARY STENT INTERVENTION N/A 09/08/2021   Procedure: CORONARY STENT INTERVENTION;  Surgeon: [name], MD;  Location: [facility] INVASIVE CV LAB;  Service: Cardiovascular;  Laterality: N/A;   CORONARY ULTRASOUND/IVUS N/A 09/08/2021   Procedure: Intravascular Ultrasound/IVUS;  Surgeon: [name], MD;  Location: [facility] INVASIVE CV LAB;  Service: Cardiovascular;  Laterality: N/A;   LEFT HEART CATH AND CORONARY ANGIOGRAPHY N/A 09/08/2021   Procedure: LEFT HEART CATH AND CORONARY ANGIOGRAPHY;  Surgeon: [name], MD;  Location: [facility] INVASIVE CV LAB;  Service: Cardiovascular;  Laterality: N/A;   LEFT HEART CATH AND CORONARY ANGIOGRAPHY N/A 09/17/2021   Procedure: LEFT HEART CATH AND CORONARY ANGIOGRAPHY;  Surgeon: Yvonne Kendall, MD;  Location: [facility] INVASIVE CV LAB;  Service: Cardiovascular;  Laterality: N/A;   LEFT HEART CATH AND CORONARY ANGIOGRAPHY N/A 09/08/2022   Procedure: LEFT HEART CATH AND CORONARY ANGIOGRAPHY;  Surgeon: Yvonne Kendall, MD;  Location: [facility] INVASIVE CV LAB;  Service: Cardiovascular;  Laterality: N/A;   RIGHT/LEFT HEART CATH AND CORONARY ANGIOGRAPHY N/A 09/02/2017   Procedure: RIGHT/LEFT HEART CATH AND CORONARY ANGIOGRAPHY;  Surgeon: [name], MD;  Location: [facility] INVASIVE CV LAB;  Service: Cardiovascular;  Laterality: N/A;   TEE WITHOUT CARDIOVERSION N/A 09/10/2022   Procedure: TRANSESOPHAGEAL ECHOCARDIOGRAM;  Surgeon: Antonieta Iba, MD;  Location: [facility] ORS;  Service: Cardiovascular;  Laterality: N/A;     Current Outpatient Medications  Medication Sig Dispense Refill   albuterol (VENTOLIN HFA) 108 (90 Base)  MCG/ACT inhaler Inhale 1-2 puffs into the lungs every 6 (six) hours as needed for wheezing or shortness of breath. 18 g 1   aspirin EC 81 MG tablet Take 1 tablet (81 mg total) by mouth daily. Swallow whole. 30 tablet 6   atorvastatin (LIPITOR) 40 MG tablet Take 1 tablet (40 mg total) by mouth daily at 6 PM. 30 tablet 2   cephALEXin (KEFLEX) 500 MG capsule Take 1 capsule (500 mg total) by mouth every 12 (twelve) hours. 5 capsule 0   cyclobenzaprine (FLEXERIL) 10 MG tablet Take 1 tablet (10 mg total) by mouth 3 (three) times daily as needed.For spasms 90 tablet 5   diltiazem (CARDIZEM) 30 MG tablet Take 1 tablet (30 mg total) by mouth every 12 (twelve) hours. 60 tablet 1   DULoxetine (CYMBALTA) 60 MG capsule Take 120 mg by mouth daily.     famotidine (PEPCID) 40 MG tablet Take 40 mg by mouth daily.      fluticasone-salmeterol (ADVAIR) 250-50 MCG/ACT AEPB Inhale 1 puff into the lungs every 12 (twelve) hours. 60 each 6   furosemide (LASIX) 20 MG  tablet Take 1 tablet (20 mg total) by mouth daily as needed (for SOB or weight gain of 3 lbs overnight). 90 tablet 3   gabapentin (NEURONTIN) 300 MG capsule Take 1 capsule by mouth at bedtime.     glipiZIDE (GLUCOTROL) 5 MG tablet Take 1 tablet (5 mg total) by mouth 2 (two) times daily before a meal. 180 tablet 1   glucose blood (GE100 BLOOD GLUCOSE TEST) test strip Use as instructed 100 each 12   isosorbide mononitrate (IMDUR) 30 MG 24 hr tablet Take 0.5 tablets (15 mg total) by mouth daily. 30 tablet 1   ivabradine (CORLANOR) 7.5 MG TABS tablet Take 2 tablets (15 mg) by mouth 2 hours prior to your Cardiac CT x 1 dose 2 tablet 0   JARDIANCE 25 MG TABS tablet Take 25 mg by mouth daily.     Lancets (ONETOUCH ULTRASOFT) lancets Check sugar twice daily 100 each 12   metFORMIN (GLUCOPHAGE) 1000 MG tablet Take 1 tablet (1,000 mg total) by mouth 2 (two) times daily with a meal. 180 tablet 1   metoprolol tartrate (LOPRESSOR) 100 MG tablet Take 1 tablet (100 mg total)  by mouth 2 (two) times daily. 60 tablet 2   traMADol (ULTRAM) 50 MG tablet Take 1 tablet (50 mg total) by mouth every 6 (six) hours as needed. 20 tablet 0   traZODone (DESYREL) 50 MG tablet Take 50-100 mg by mouth at bedtime.     No current facility-administered medications for this visit.    Allergies:   Patient has no known allergies.    Social History:  The patient  reports that he has never smoked. He has never used smokeless tobacco. He reports current alcohol use. He reports that he does not use drugs.   Family History:  The patient's family history includes Asthma in his mother; Diabetes in his mother; Heart attack in his father; Heart disease in his father; Heart disease (age of onset: 39) in his brother; Sjogren's syndrome in his sister.    ROS:  Please see the history of present illness.   Otherwise, review of systems are positive for none.   All other systems are reviewed and negative.   PHYSICAL EXAM: VS:  BP 120/60 (BP Location: Left Arm, Patient Position: Sitting, Cuff Size: Large)   Pulse 97   Ht 5\' 8"  (1.727 m)   Wt (!) 318 lb 2 oz (144.3 kg)   SpO2 98%   BMI 48.37 kg/m  , BMI Body mass index is 48.37 kg/m. GEN: Well nourished, well developed, in no acute distress  HEENT: normal  Neck: no JVD or masses.  No carotid bruits. Cardiac: RRR; norubs, or gallops.  1/6 systolic murmur at the aortic area.  Mild bilateral leg edema. Respiratory:  clear to auscultation bilaterally, normal work of breathing GI: soft, nontender, nondistended, + BS MS: no deformity or atrophy  Skin: warm and dry, no rash Neuro:  Strength and sensation are intact Psych: euthymic mood, full affect Right ulnar pulses normal.  No hematoma.  EKG:  EKG is ordered today. The ekg ordered today demonstrates normal sinus rhythm with low voltage.   Recent Labs: 02/19/2022: Magnesium 1.8 09/07/2022: ALT 18; B Natriuretic Peptide 39.4 09/11/2022: BUN 15; Creatinine, Ser 1.13; Hemoglobin 11.7; Platelets  192; Potassium 3.9; Sodium 135    Lipid Panel    Component Value Date/Time   CHOL 109 09/08/2022 0453   CHOL 176 12/08/2017 1150   TRIG 138 09/08/2022 0453   HDL 31 (L)  09/08/2022 0453   HDL 44 12/08/2017 1150   CHOLHDL 3.5 09/08/2022 0453   VLDL 28 09/08/2022 0453   LDLCALC 50 09/08/2022 0453   LDLCALC 103 (H) 12/08/2017 1150      Wt Readings from Last 3 Encounters:  09/22/22 (!) 318 lb 2 oz (144.3 kg)  09/11/22 (!) 317 lb 14.4 oz (144.2 kg)  06/30/22 (!) 330 lb (149.7 kg)        08/27/2017   11:43 AM  PAD Screen  Previous PAD dx? No  Previous surgical procedure? No  Pain with walking? Yes  Subsides with rest? No  Feet/toe relief with dangling? Yes  Painful, non-healing ulcers? No  Extremities discolored? Yes      ASSESSMENT AND PLAN:  1.  Coronary artery disease involving native coronary arteries: Recent cardiac catheterization showed patent LAD stent with no significant restenosis and no evidence of obstructive disease.  Continue low-dose aspirin.  2.  Aortic valve disease: The patient is known to have severe gradient across the LVOT/aortic valve on cardiac catheterization.  His transthoracic echo images have been always very limited due to his BMI.  He did have a recent transesophageal echocardiogram and these images were personally reviewed by me.  In addition, I asked Dr. Eden Emms and Dr. Donnamarie Poag to review the images and the census is that the issue is likely related to aortic valve and not LVOT obstruction.  The aortic valve is likely bicuspid with severe stenosis and moderate regurgitation.  The patient is scheduled to get cardiac CTA TAVR protocol but most likely he will require surgery to replace the valve.  I am going to refer him to Dr. Leafy Ro for evaluation.  3.   Essential hypertension: Blood pressure is controlled.   4.  Hyperlipidemia: Continue atorvastatin with a target LDL of less Marshall 70.  5.  Morbid obesity: Continue healthy lifestyle  changes.  6.  FMLA: The patient has been limited by his symptoms and he will require intermittently likely until his aortic valve etiology is addressed.    Disposition:   Follow-up in 6 weeks.  Signed,  Lorine Bears, MD  09/22/2022 4:48 PM    [facility]

## 2022-09-22 NOTE — Patient Instructions (Signed)
Medication Instructions:  No changes *If you need a refill on your cardiac medications before your next appointment, please call your pharmacy*   Lab Work: None ordered If you have labs (blood work) drawn today and your tests are completely normal, you will receive your results only by: MyChart Message (if you have MyChart) OR A paper copy in the mail If you have any lab test that is abnormal or we need to change your treatment, we will call you to review the results.   Testing/Procedures: None ordered   Follow-Up: At [facility], you and your health needs are our priority.  As part of our continuing mission to provide you with exceptional heart care, we have created designated Provider Care Teams.  These Care Teams include your primary Cardiologist (physician) and Advanced Practice Providers (APPs -  Physician Assistants and Nurse Practitioners) who all work together to provide you with the care you need, when you need it.  We recommend signing up for the patient portal called "MyChart".  Sign up information is provided on this After Visit Summary.  MyChart is used to connect with patients for Virtual Visits (Telemedicine).  Patients are able to view lab/test results, encounter notes, upcoming appointments, etc.  Non-urgent messages can be sent to your provider as well.   To learn more about what you can do with MyChart, go to [email or web address].    Your next appointment:   6 week(s)  Provider:   You may see Lorine Bears, MD or one of the following Advanced Practice Providers on your designated Care Team:   Nicolasa Ducking, NP Eula Listen, PA-C Cadence Fransico Michael, PA-C Charlsie Quest, NP

## 2022-09-22 NOTE — Telephone Encounter (Signed)
Patient filled out FMLA paperwork & paid $29 by check placed in box.

## 2022-09-23 ENCOUNTER — Other Ambulatory Visit: Payer: Self-pay

## 2022-09-23 DIAGNOSIS — I350 Nonrheumatic aortic (valve) stenosis: Secondary | ICD-10-CM

## 2022-09-25 NOTE — Telephone Encounter (Signed)
FMLA paperwork was faxed as requested & patient was notified complete

## 2022-09-25 NOTE — Telephone Encounter (Signed)
Forms have been signed and returned to the front.

## 2022-09-30 ENCOUNTER — Telehealth: Payer: Self-pay | Admitting: *Deleted

## 2022-09-30 ENCOUNTER — Telehealth (HOSPITAL_COMMUNITY): Payer: Self-pay | Admitting: Emergency Medicine

## 2022-09-30 NOTE — Telephone Encounter (Signed)
Patient is returning RN's call. Please advise. 

## 2022-09-30 NOTE — Telephone Encounter (Signed)
Reaching out to patient to offer assistance regarding upcoming cardiac imaging study; pt verbalizes understanding of appt date/time, parking situation and where to check in, pre-test NPO status and medications ordered, and verified current allergies; name and call back number provided for further questions should they arise Ailene Royal RN Navigator Cardiac [facility] [phone] office [phone] cell 

## 2022-09-30 NOTE — Telephone Encounter (Signed)
Left a message for the patient to call back.  

## 2022-09-30 NOTE — Telephone Encounter (Signed)
Called the patient to discuss his upcoming appointment with Dr. Leafy Ro and the reason for it (per Dr. Kirke Corin). All questions have been answered. Appointment is 10/06/22

## 2022-10-01 ENCOUNTER — Ambulatory Visit (HOSPITAL_COMMUNITY)
Admission: RE | Admit: 2022-10-01 | Discharge: 2022-10-01 | Disposition: A | Discharge status: Home / Self Care | Payer: BLUE CROSS/BLUE SHIELD | Source: Ambulatory Visit | Attending: Cardiology | Admitting: Cardiology

## 2022-10-01 DIAGNOSIS — I350 Nonrheumatic aortic (valve) stenosis: Secondary | ICD-10-CM | POA: Diagnosis not present

## 2022-10-01 MED ORDER — IOHEXOL 350 MG/ML SOLN
95.0000 mL | Freq: Once | INTRAVENOUS | Status: AC | PRN
  Administered 2022-10-01: 95 mL via INTRAVENOUS

## 2022-10-01 MED ORDER — NITROGLYCERIN 0.4 MG SL SUBL: 0.8000 mg | SUBLINGUAL_TABLET | Freq: Once | SUBLINGUAL | Status: DC

## 2022-10-01 NOTE — Progress Notes (Signed)
This RN attempted to call Dr. Servando Salina due to heart rate bing in the 80s initially and then being 76bpm on scanner with breath hold. RN attempted to call x2 with no answer. CT techs aware and stated they will attempt the scan.

## 2022-10-05 ENCOUNTER — Telehealth: Payer: Self-pay | Admitting: Cardiovascular Disease

## 2022-10-05 NOTE — Telephone Encounter (Signed)
Received forms from [employer] via fax. Contacted patient to inform of $29 processing fee. Forms left up at check in area for patient to sign.

## 2022-10-06 ENCOUNTER — Encounter: Payer: Self-pay | Admitting: Thoracic Surgery (Cardiothoracic Vascular Surgery)

## 2022-10-06 ENCOUNTER — Institutional Professional Consult (permissible substitution): Payer: BLUE CROSS/BLUE SHIELD | Admitting: Thoracic Surgery (Cardiothoracic Vascular Surgery)

## 2022-10-06 ENCOUNTER — Other Ambulatory Visit (HOSPITAL_COMMUNITY): Payer: Self-pay

## 2022-10-06 ENCOUNTER — Telehealth: Payer: Self-pay | Admitting: Cardiovascular Disease

## 2022-10-06 VITALS — BP 145/86 | HR 110 | Resp 20 | Ht 68.0 in | Wt 318.0 lb

## 2022-10-06 DIAGNOSIS — G4733 Obstructive sleep apnea (adult) (pediatric): Secondary | ICD-10-CM | POA: Diagnosis not present

## 2022-10-06 DIAGNOSIS — I350 Nonrheumatic aortic (valve) stenosis: Secondary | ICD-10-CM

## 2022-10-06 NOTE — Patient Instructions (Signed)
Futher gradient testing with Dr Kirke Corin

## 2022-10-06 NOTE — Telephone Encounter (Signed)
Pt called questioning about what type of procedure he will have and when it will be schedule. Nurse recommended pt contact surgeon office and provided number to [facility].   Pt verbalized understanding.

## 2022-10-06 NOTE — Progress Notes (Signed)
[address]       [place] [address]             [phone]           Brett Marshall [facility] Medical Record #[id number] Date of Birth: [birth year]  [name], MD Ethelda Chick, MD  Chief Complaint:  constant cp and sob   History of Present Illness:     Pt is a pleasant 57 yo male who has an extensive history of CAD and possible LVOT obstruction. Pt is sp prox LAD stenting 2023 and with 20-30 mm gradient on LVOT. He did well for several months but returned with more CP and cath with patent LAD stent but with 80 mmHg gradient in LVOT. BB was started and pt has what he reports as daily CP central radiating up to neck and left shoulder. Comes with or without exertion. This is associated also with SOB that will be with just sitting. He was worked up most recently with ECHO, TEE, cath and TAVR CTA and with conflicting evidence of mild noncalcific AS (maybe bicuspid valve) and no LVOT obstruction. No recurrent CAD. Pt was reviewed by several other cardiologists and concensus was that his AS (bicuspid) the culprit and CTS consulted. Pt understands the complexity of his situation      Past Medical History:  Diagnosis Date   Asthma    CAD (coronary artery disease)    a. 08/2017 Cath: Diff nonobs dzs, EF 45-50%, diff HK; b. 08/2021 MV: Ant/apical ishemia; c. 08/2021 PCI: LAD 95ost (4.0x15 Onyx Frontier DES), EF 55-65%; c. 09/2021 Cath: LM nl, LAD patent stent, 81m, 30d, RI min irregs, LCX min irregs, RCA 50p. LVOT grad 50-85mmHg (rest), 70-168mmHg (provoked).   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 08/2017 LV gram: EF 45-50%, glob HK; b. 08/2021 Echo: EF 60-65%, no rwma, GrI DD, nl RV fxn; c. 09/2021 cMRI: EF 60%, no LVOT obstruction/gradient. Mild conc LVH, mild AS. No myocardial  scar/fibrosis. No evidence of HCM.   Hyperlipidemia    Leg swelling    Left   Low back pain    Mixed ICM & NICM    a. 08/2017 LV gram: EF 45-50%, glob HK; b. 08/2021 Echo: EF 60-65%.   Morbid obesity (HCC)    OSA on CPAP    non compliant   Pulmonary hypertension (HCC)     Past Surgical History:  Procedure Laterality Date   CARDIAC CATHETERIZATION     Mackey no stents.   COLONOSCOPY WITH PROPOFOL N/A 04/11/2019   Procedure: COLONOSCOPY WITH PROPOFOL;  Surgeon: Midge Minium, MD;  Location[facility] ENDOSCOPY;  Service: Endoscopy;  Laterality: N/A;   CORONARY STENT INTERVENTION N/A 09/08/2021   Procedure: CORONARY STENT INTERVENTION;  Surgeon: [name], MD;  Location: [facility] INVASIVE CV LAB;  Service: Cardiovascular;  Laterality: N/A;   CORONARY ULTRASOUND/IVUS N/A 09/08/2021   Procedure: Intravascular Ultrasound/IVUS;  Surgeon: [name], MD;  Location: [facility] INVASIVE CV LAB;  Service: Cardiovascular;  Laterality: N/A;   LEFT HEART CATH AND CORONARY ANGIOGRAPHY N/A 09/08/2021   Procedure: LEFT HEART CATH AND CORONARY ANGIOGRAPHY;  Surgeon: [name], MD;  Location: [facility] INVASIVE CV LAB;  Service: Cardiovascular;  Laterality: N/A;   LEFT HEART CATH  AND CORONARY ANGIOGRAPHY N/A 09/17/2021   Procedure: LEFT HEART CATH AND CORONARY ANGIOGRAPHY;  Surgeon: Yvonne Kendall, MD;  Location: [facility] INVASIVE CV LAB;  Service: Cardiovascular;  Laterality: N/A;   LEFT HEART CATH AND CORONARY ANGIOGRAPHY N/A 09/08/2022   Procedure: LEFT HEART CATH AND CORONARY ANGIOGRAPHY;  Surgeon: Yvonne Kendall, MD;  Location: [facility] INVASIVE CV LAB;  Service: Cardiovascular;  Laterality: N/A;   RIGHT/LEFT HEART CATH AND CORONARY ANGIOGRAPHY N/A 09/02/2017   Procedure: RIGHT/LEFT HEART CATH AND CORONARY ANGIOGRAPHY;  Surgeon: [name], MD;  Location: [facility] INVASIVE CV LAB;  Service: Cardiovascular;  Laterality: N/A;   TEE WITHOUT CARDIOVERSION N/A 09/10/2022   Procedure: TRANSESOPHAGEAL  ECHOCARDIOGRAM;  Surgeon: Antonieta Iba, MD;  Location: [facility] ORS;  Service: Cardiovascular;  Laterality: N/A;    Social History   Tobacco Use  Smoking Status Never  Smokeless Tobacco Never    Social History   Substance and Sexual Activity  Alcohol Use Yes   Comment: few times per year/ socially    Social History   Socioeconomic History   Marital status: Married    Spouse name: Not on file   Number of children: Not on file   Years of education: Not on file   Highest education level: Not on file  Occupational History   Occupation: Quarry manager    Employer: [employer]  Tobacco Use   Smoking status: Never   Smokeless tobacco: Never  Vaping Use   Vaping Use: Never used  Substance and Sexual Activity   Alcohol use: Yes    Comment: few times per year/ socially   Drug use: No   Sexual activity: Yes  Other Topics Concern   Not on file  Social History Narrative   Goes to gym.   Social Determinants of Health   Financial Resource Strain: Not on file  Food Insecurity: No Food Insecurity (09/07/2022)   Hunger Vital Sign    Worried About Running Out of Food in the Last Year: Never true    Ran Out of Food in the Last Year: Never true  Transportation Needs: No Transportation Needs (09/07/2022)   PRAPARE - Administrator, Civil Service (Medical): No    Lack of Transportation (Non-Medical): No  Physical Activity: Not on file  Stress: Not on file  Social Connections: Not on file  Intimate Partner Violence: Not At Risk (09/07/2022)   Humiliation, Afraid, Rape, and Kick questionnaire    Fear of Current or Ex-Partner: No    Emotionally Abused: No    Physically Abused: No    Sexually Abused: No    No Known Allergies  Current Outpatient Medications  Medication Sig Dispense Refill   albuterol (VENTOLIN HFA) 108 (90 Base) MCG/ACT inhaler Inhale 1-2 puffs into the lungs every 6 (six) hours as needed for wheezing or shortness of breath. 18 g 1   aspirin EC 81 MG  tablet Take 1 tablet (81 mg total) by mouth daily. Swallow whole. 30 tablet 6   atorvastatin (LIPITOR) 40 MG tablet Take 1 tablet (40 mg total) by mouth daily at 6 PM. 30 tablet 2   cephALEXin (KEFLEX) 500 MG capsule Take 1 capsule (500 mg total) by mouth every 12 (twelve) hours. 5 capsule 0   cyclobenzaprine (FLEXERIL) 10 MG tablet Take 1 tablet (10 mg total) by mouth 3 (three) times daily as needed.For spasms 90 tablet 5   diltiazem (CARDIZEM) 30 MG tablet Take 1 tablet (30 mg total) by mouth every 12 (twelve) hours. 60  tablet 1   DULoxetine (CYMBALTA) 60 MG capsule Take 120 mg by mouth daily.     famotidine (PEPCID) 40 MG tablet Take 40 mg by mouth daily.      fluticasone-salmeterol (ADVAIR) 250-50 MCG/ACT AEPB Inhale 1 puff into the lungs every 12 (twelve) hours. 60 each 6   furosemide (LASIX) 20 MG tablet Take 1 tablet (20 mg total) by mouth daily as needed (for SOB or weight gain of 3 lbs overnight). 90 tablet 3   gabapentin (NEURONTIN) 300 MG capsule Take 1 capsule by mouth at bedtime.     glipiZIDE (GLUCOTROL) 5 MG tablet Take 1 tablet (5 mg total) by mouth 2 (two) times daily before a meal. 180 tablet 1   glucose blood (GE100 BLOOD GLUCOSE TEST) test strip Use as instructed 100 each 12   isosorbide mononitrate (IMDUR) 30 MG 24 hr tablet Take 0.5 tablets (15 mg total) by mouth daily. 30 tablet 1   ivabradine (CORLANOR) 7.5 MG TABS tablet Take 2 tablets (15 mg) by mouth 2 hours prior to your Cardiac CT x 1 dose 2 tablet 0   JARDIANCE 25 MG TABS tablet Take 25 mg by mouth daily.     Lancets (ONETOUCH ULTRASOFT) lancets Check sugar twice daily 100 each 12   metFORMIN (GLUCOPHAGE) 1000 MG tablet Take 1 tablet (1,000 mg total) by mouth 2 (two) times daily with a meal. 180 tablet 1   metoprolol tartrate (LOPRESSOR) 100 MG tablet Take 1 tablet (100 mg total) by mouth 2 (two) times daily. 60 tablet 2   traZODone (DESYREL) 50 MG tablet Take 50-100 mg by mouth at bedtime.     No current  facility-administered medications for this visit.     Family History  Problem Relation Age of Onset   Diabetes Mother    Asthma Mother    Heart disease Father    Heart attack Father    Sjogren's syndrome Sister    Heart disease Brother 48       CAD/CABG       Physical Exam:      Diagnostic Studies & Laboratory data: I have personally reviewed the following studies and agree with the findings   CATH (08/2022) Coronary Findings  Diagnostic Dominance: Co-dominant Left Main  Vessel is angiographically normal.    Left Anterior Descending  Non-stenotic Ost LAD lesion was previously treated.  Mid LAD lesion is 20% stenosed.  Dist LAD lesion is 60% stenosed.    Second Diagonal Branch  There is mild disease in the vessel.    Ramus Intermedius  The vessel exhibits minimal luminal irregularities.    Left Circumflex  The vessel exhibits minimal luminal irregularities.    Right Coronary Artery  Vessel is small.  Prox RCA lesion is 50% stenosed.     TEE (08/2022) IMPRESSIONS     1. Left ventricular ejection fraction, by estimation, is 60 to 65%. The  left ventricle has normal function. The left ventricle has no regional  wall motion abnormalities. There is mild concentric LVH with moderate  asymmetric left ventricular hypertrophy  of the basal-septal segment.   2. Right ventricular systolic function is normal. The right ventricular  size is normal.   3. The mitral valve is normal in structure. No evidence of mitral valve  regurgitation. No evidence of mitral stenosis.   4. The aortic valve is tricuspid. Select images suggesting mild valve  prolapse LCC. There is mild calcification of the aortic valve. Aortic  valve regurgitation is moderate. Visually  with mild aortic valve stenosis.  No subvalvular membrane noted.   5. The inferior vena cava is normal in size with greater than 50%  respiratory variability, suggesting right atrial pressure of 3 mmHg.   6. No left  atrial/left atrial appendage thrombus was detected.   7. Agitated saline contrast bubble study was negative, with no evidence  of any interatrial shunt.   Conclusion(s)/Recommendation(s): Normal biventricular function without  evidence of hemodynamically significant valvular heart disease.   FINDINGS   Left Ventricle: Left ventricular ejection fraction, by estimation, is 60  to 65%. The left ventricle has normal function. The left ventricle has no  regional wall motion abnormalities. The left ventricular internal cavity  size was normal in size. There is   moderate asymmetric left ventricular hypertrophy of the basal-septal  segment.   Right Ventricle: The right ventricular size is normal. No increase in  right ventricular wall thickness. Right ventricular systolic function is  normal.   Left Atrium: Left atrial size was normal in size. No left atrial/left  atrial appendage thrombus was detected.   Right Atrium: Right atrial size was normal in size.   Pericardium: There is no evidence of pericardial effusion.   Mitral Valve: The mitral valve is normal in structure. No evidence of  mitral valve regurgitation. No evidence of mitral valve stenosis.   Tricuspid Valve: The tricuspid valve is normal in structure. Tricuspid  valve regurgitation is not demonstrated. No evidence of tricuspid  stenosis.   Aortic Valve: The aortic valve is tricuspid. There is mild calcification  of the aortic valve. Aortic valve regurgitation is moderate. Mild aortic  stenosis is present.   Pulmonic Valve: The pulmonic valve was normal in structure. Pulmonic valve  regurgitation is not visualized. No evidence of pulmonic stenosis.   Aorta: The aortic root is normal in size and structure.   Venous: The inferior vena cava is normal in size with greater than 50%  respiratory variability, suggesting right atrial pressure of 3 mmHg.   IAS/Shunts: No atrial level shunt detected by color flow Doppler.  Agitated  saline contrast was given intravenously to evaluate for intracardiac  shunting. Agitated saline contrast bubble study was negative, with no  evidence of any interatrial shunt.    Recent Radiology Findings:   CTA (09/2022) FINDINGS: Aortic Valve: AV is bicuspid with partial raphe between the right and left cusps. The valve domes in systole and is markedly thickened with only the non coronary cusp being calcified Valve area by planimetry is 1.7 Cm2 at leaflet tips in 30% systolic phase There is a fairly large area of central non coaptation explaining patients aortic insufficiency as well the LVOT is large with no SAM, no sub aortic web and no outflow tract obstruction   Aorta: No aneurysm, normal arch vessels No coarctation and no significant atherosclerosis   Sino-tubular Junction: 24.5 mm   Ascending Thoracic Aorta: 30 mm   Aortic Arch: 22 mm   Descending Thoracic Aorta: 22 mm   Sinus of Valsalva Measurements:   Non-coronary: 28.9 mm   Right - coronary: 27.3 mm  Height 20 mm   Left -   coronary: 28.2 mm  Height 20.4 mm   Coronary Artery Height above Annulus:   Left Main: 16.5 mm above annulus   Right Coronary: 17.2 mm above annulus   Virtual Basal Annulus Measurements:   Maximum / Minimum Diameter: 23.9 mm x 20.5 mm Average diameter 22.6 mm   Perimeter: 72.5 mm   Area: 400 mm2  Coronary Arteries: Sufficient height above annulus for deployment Patent proximal LAD stent   Optimum Fluoroscopic Angle for Delivery: LAO 13 Cranial 10 degrees   IMPRESSION: 1 coronary calcium score not performed due to prior LAD stent Left dominant cors with patent LAD stent   2.Aortic valve calcium score 447 Valve appears bicuspid with calcium only in non coronary cusp and partial raphe between right and left cusps. Marked thickening of leaflet tips with systolic doming   3.  Normal ascending thoracic aorta with no coarctation   4. Optimum angiographic angle for  deployment LAO 13 Cranial 10 degrees   5. Valve only sizes to a 23 mm Sapien 3 Medtronic Evolut sizes to 26 mm valve but not ideal given young age and LAD stent Despite patients size may be better served by surgical consultation. Calcium score very low and would need industry to review with embedded geometry as valve anchoring may be more above annulus      Recent Lab Findings: Lab Results  Component Value Date   WBC 7.9 09/11/2022   HGB 11.7 (L) 09/11/2022   HCT 35.9 (L) 09/11/2022   PLT 192 09/11/2022   GLUCOSE 251 (H) 09/11/2022   CHOL 109 09/08/2022   TRIG 138 09/08/2022   HDL 31 (L) 09/08/2022   LDLCALC 50 09/08/2022   ALT 18 09/07/2022   AST 21 09/07/2022   NA 135 09/11/2022   K 3.9 09/11/2022   CL 106 09/11/2022   CREATININE 1.13 09/11/2022   BUN 15 09/11/2022   CO2 23 09/11/2022   TSH 2.82 02/08/2016   INR 1.1 09/07/2022   HGBA1C 11.7 (H) 09/07/2022      Assessment / Plan:     Pt is a 57 yo male with CAD, obesity, DM, OSA, HTN and with ongoing symptoms of what sounds like angina and concern is that the bicuspid aortic valve is most difficult to assertain its actual gradient and the component of hypertophic obstruction unclear. On discussion with Dr Kirke Corin, we will further assess his entire outflow area with precision catheter pressure catheter to determine actual pathology to be addressed with surgery.  Pt and wife understand the issues and I had reviewed the TEE with them and are in agreement to proceed with further testing.  All the risks, goals and recovery were discussed with them and also valve options of mechanical vs tissue valve and after further cath will decide prosthesis at that time.   I have spent 60 min in review of the records, viewing studies and in face to face with patient and in coordination of future care    Eugenio Hoes 10/06/2022 12:26 PM

## 2022-10-06 NOTE — Telephone Encounter (Signed)
Pt would like a callback from nurse to find out what procedure MD wants him to have and when it'll be done so that he can discuss it further. Please advise

## 2022-10-07 ENCOUNTER — Ambulatory Visit: Payer: BLUE CROSS/BLUE SHIELD | Admitting: Cardiovascular Disease

## 2022-10-08 ENCOUNTER — Telehealth: Payer: Self-pay | Admitting: Cardiovascular Disease

## 2022-10-08 NOTE — Telephone Encounter (Signed)
Patient called and was wondering if he could get his wife to sign his FMLA forms since he isn't feeling well. Patient also said that Dr. Kirke Corin said that he was going to schedule him a procedure to be done but he doesn't see it in his mychart. Want to know the update on that as wll

## 2022-10-08 NOTE — Telephone Encounter (Signed)
The patient was calling to see what the test was that was needed ordered by Dr. Kirke Corin so he could get it done.

## 2022-10-09 ENCOUNTER — Encounter: Payer: Self-pay | Admitting: Cardiovascular Disease

## 2022-10-09 NOTE — Telephone Encounter (Signed)
Forms placed in providers box for completion

## 2022-10-09 NOTE — Telephone Encounter (Signed)
Patient came by office Paid $29 fee with check and signed [facility] forms Placed in nurse box

## 2022-10-14 ENCOUNTER — Telehealth: Payer: Self-pay | Admitting: Cardiovascular Disease

## 2022-10-14 ENCOUNTER — Encounter: Payer: Self-pay | Admitting: Cardiovascular Disease

## 2022-10-14 DIAGNOSIS — I350 Nonrheumatic aortic (valve) stenosis: Secondary | ICD-10-CM

## 2022-10-14 DIAGNOSIS — I10 Essential (primary) hypertension: Secondary | ICD-10-CM

## 2022-10-14 NOTE — Telephone Encounter (Signed)
See my chart encounter.

## 2022-10-14 NOTE — Addendum Note (Signed)
Addended by: Sandi Mariscal on: 10/14/2022 05:04 PM   Modules accepted: Orders

## 2022-10-14 NOTE — Telephone Encounter (Signed)
Patient called to follow-up on getting scheduled for surgery for replacing flap.

## 2022-10-14 NOTE — Telephone Encounter (Signed)
Patient has been made aware that, per Dr. Kirke Corin, he will need a right and left heart cath. This will be scheduled for Monday June 3rd.

## 2022-10-15 ENCOUNTER — Other Ambulatory Visit
Admission: RE | Admit: 2022-10-15 | Discharge: 2022-10-15 | Disposition: A | Discharge status: Home / Self Care | Payer: BLUE CROSS/BLUE SHIELD | Source: Ambulatory Visit | Attending: Cardiovascular Disease | Admitting: Cardiovascular Disease

## 2022-10-15 DIAGNOSIS — I350 Nonrheumatic aortic (valve) stenosis: Secondary | ICD-10-CM | POA: Diagnosis not present

## 2022-10-15 DIAGNOSIS — I10 Essential (primary) hypertension: Secondary | ICD-10-CM | POA: Diagnosis not present

## 2022-10-15 LAB — BASIC METABOLIC PANEL
Anion gap: 8 (ref 5–15)
BUN: 20 mg/dL (ref 6–20)
CO2: 26 mmol/L (ref 22–32)
Calcium: 9 mg/dL (ref 8.9–10.3)
Chloride: 99 mmol/L (ref 98–111)
Creatinine, Ser: 1.3 mg/dL — ABNORMAL HIGH (ref 0.61–1.24)
GFR, Estimated: >60 mL/min (ref >60)
Glucose, Bld: 303 mg/dL — ABNORMAL HIGH (ref 70–99)
Potassium: 4.5 mmol/L (ref 3.5–5.1)
Sodium: 133 mmol/L — ABNORMAL LOW (ref 135–145)

## 2022-10-15 LAB — CBC
HCT: 44.6 % (ref 39.0–52.0)
Hemoglobin: 14.4 g/dL (ref 13.0–17.0)
MCH: 27.5 pg (ref 26.0–34.0)
MCHC: 32.3 g/dL (ref 30.0–36.0)
MCV: 85.1 fL (ref 80.0–100.0)
Platelets: 246 10*3/uL (ref 150–400)
RBC: 5.24 MIL/uL (ref 4.22–5.81)
RDW: 14.4 % (ref 11.5–15.5)
WBC: 11.5 10*3/uL — ABNORMAL HIGH (ref 4.0–10.5)
nRBC: 0 % (ref 0.0–0.2)

## 2022-10-15 NOTE — Telephone Encounter (Signed)
Instructions sent to MyChart

## 2022-10-19 ENCOUNTER — Encounter
Admission: RE | Disposition: A | Discharge status: Home / Self Care | Payer: Self-pay | Source: Home / Self Care | Attending: Cardiovascular Disease

## 2022-10-19 ENCOUNTER — Other Ambulatory Visit: Payer: BLUE CROSS/BLUE SHIELD

## 2022-10-19 ENCOUNTER — Encounter: Payer: Self-pay | Admitting: Cardiovascular Disease

## 2022-10-19 ENCOUNTER — Ambulatory Visit
Admission: RE | Admit: 2022-10-19 | Discharge: 2022-10-19 | Disposition: A | Discharge status: Home / Self Care | Payer: BLUE CROSS/BLUE SHIELD | Attending: Cardiovascular Disease | Admitting: Cardiovascular Disease

## 2022-10-19 DIAGNOSIS — G4733 Obstructive sleep apnea (adult) (pediatric): Secondary | ICD-10-CM | POA: Insufficient documentation

## 2022-10-19 DIAGNOSIS — I272 Pulmonary hypertension, unspecified: Secondary | ICD-10-CM | POA: Insufficient documentation

## 2022-10-19 DIAGNOSIS — Z955 Presence of coronary angioplasty implant and graft: Secondary | ICD-10-CM | POA: Insufficient documentation

## 2022-10-19 DIAGNOSIS — I5032 Chronic diastolic (congestive) heart failure: Secondary | ICD-10-CM | POA: Diagnosis not present

## 2022-10-19 DIAGNOSIS — I2510 Atherosclerotic heart disease of native coronary artery without angina pectoris: Secondary | ICD-10-CM | POA: Diagnosis not present

## 2022-10-19 DIAGNOSIS — E785 Hyperlipidemia, unspecified: Secondary | ICD-10-CM | POA: Insufficient documentation

## 2022-10-19 DIAGNOSIS — Z7982 Long term (current) use of aspirin: Secondary | ICD-10-CM | POA: Diagnosis not present

## 2022-10-19 DIAGNOSIS — Z7984 Long term (current) use of oral hypoglycemic drugs: Secondary | ICD-10-CM | POA: Insufficient documentation

## 2022-10-19 DIAGNOSIS — I110 Hypertensive heart disease with heart failure: Secondary | ICD-10-CM | POA: Insufficient documentation

## 2022-10-19 DIAGNOSIS — Z79899 Other long term (current) drug therapy: Secondary | ICD-10-CM | POA: Diagnosis not present

## 2022-10-19 DIAGNOSIS — E119 Type 2 diabetes mellitus without complications: Secondary | ICD-10-CM | POA: Diagnosis not present

## 2022-10-19 DIAGNOSIS — I350 Nonrheumatic aortic (valve) stenosis: Secondary | ICD-10-CM | POA: Insufficient documentation

## 2022-10-19 DIAGNOSIS — Z6841 Body Mass Index (BMI) 40.0 and over, adult: Secondary | ICD-10-CM | POA: Insufficient documentation

## 2022-10-19 HISTORY — PX: RIGHT/LEFT HEART CATH AND CORONARY ANGIOGRAPHY: CATH118266

## 2022-10-19 LAB — POCT I-STAT EG7
Acid-base deficit: 1 mmol/L (ref 0.0–2.0)
Bicarbonate: 25.6 mmol/L (ref 20.0–28.0)
Calcium, Ion: 1.23 mmol/L (ref 1.15–1.40)
HCT: 39 % (ref 39.0–52.0)
Hemoglobin: 13.3 g/dL (ref 13.0–17.0)
O2 Saturation: 61 %
Potassium: 4.8 mmol/L (ref 3.5–5.1)
Sodium: 137 mmol/L (ref 135–145)
TCO2: 27 mmol/L (ref 22–32)
pCO2, Ven: 48.4 mmHg (ref 44–60)
pH, Ven: 7.331 (ref 7.25–7.43)
pO2, Ven: 34 mmHg (ref 32–45)

## 2022-10-19 LAB — POCT I-STAT 7, (LYTES, BLD GAS, ICA,H+H)
Acid-base deficit: 3 mmol/L — ABNORMAL HIGH (ref 0.0–2.0)
Bicarbonate: 22.5 mmol/L (ref 20.0–28.0)
Calcium, Ion: 1.16 mmol/L (ref 1.15–1.40)
HCT: 39 % (ref 39.0–52.0)
Hemoglobin: 13.3 g/dL (ref 13.0–17.0)
O2 Saturation: 93 %
Potassium: 4.6 mmol/L (ref 3.5–5.1)
Sodium: 138 mmol/L (ref 135–145)
TCO2: 24 mmol/L (ref 22–32)
pCO2 arterial: 41.4 mmHg (ref 32–48)
pH, Arterial: 7.344 — ABNORMAL LOW (ref 7.35–7.45)
pO2, Arterial: 71 mmHg — ABNORMAL LOW (ref 83–108)

## 2022-10-19 LAB — GLUCOSE, CAPILLARY: Glucose-Capillary: 199 mg/dL — ABNORMAL HIGH (ref 70–99)

## 2022-10-19 SURGERY — RIGHT/LEFT HEART CATH AND CORONARY ANGIOGRAPHY
Anesthesia: Moderate Sedation | Laterality: Bilateral

## 2022-10-19 MED ORDER — HEPARIN (PORCINE) IN NACL 1000-0.9 UT/500ML-% IV SOLN
INTRAVENOUS | Status: DC | PRN
  Administered 2022-10-19: 1000 mL

## 2022-10-19 MED ORDER — VERAPAMIL HCL 2.5 MG/ML IV SOLN
INTRAVENOUS | Status: DC | PRN
  Administered 2022-10-19: 2.5 mg via INTRA_ARTERIAL

## 2022-10-19 MED ORDER — SODIUM CHLORIDE 0.9% FLUSH: 3.0000 mL | Freq: Two times a day (BID) | INTRAVENOUS | Status: DC

## 2022-10-19 MED ORDER — ASPIRIN 81 MG PO CHEW
81.0000 mg | CHEWABLE_TABLET | ORAL | Status: AC
  Administered 2022-10-19: 81 mg via ORAL

## 2022-10-19 MED ORDER — MIDAZOLAM HCL 2 MG/2ML IJ SOLN
INTRAMUSCULAR | Status: DC | PRN
  Administered 2022-10-19: 1 mg via INTRAVENOUS

## 2022-10-19 MED ORDER — VERAPAMIL HCL 2.5 MG/ML IV SOLN
INTRAVENOUS | Status: AC
  Filled 2022-10-19: qty 2

## 2022-10-19 MED ORDER — ASPIRIN 81 MG PO CHEW
CHEWABLE_TABLET | ORAL | Status: AC
  Filled 2022-10-19: qty 1

## 2022-10-19 MED ORDER — HEPARIN SODIUM (PORCINE) 1000 UNIT/ML IJ SOLN
INTRAMUSCULAR | Status: AC
  Filled 2022-10-19: qty 10

## 2022-10-19 MED ORDER — FENTANYL CITRATE (PF) 100 MCG/2ML IJ SOLN
INTRAMUSCULAR | Status: AC
  Filled 2022-10-19: qty 2

## 2022-10-19 MED ORDER — SODIUM CHLORIDE 0.9 % IV SOLN
INTRAVENOUS | Status: DC
  Administered 2022-10-19: 1000 mL via INTRAVENOUS

## 2022-10-19 MED ORDER — HEPARIN (PORCINE) IN NACL 1000-0.9 UT/500ML-% IV SOLN
INTRAVENOUS | Status: AC
  Filled 2022-10-19: qty 1000

## 2022-10-19 MED ORDER — HEPARIN SODIUM (PORCINE) 1000 UNIT/ML IJ SOLN
INTRAMUSCULAR | Status: DC | PRN
  Administered 2022-10-19: 6000 [IU] via INTRAVENOUS

## 2022-10-19 MED ORDER — LIDOCAINE HCL (PF) 1 % IJ SOLN
INTRAMUSCULAR | Status: DC | PRN
  Administered 2022-10-19: 5 mL

## 2022-10-19 MED ORDER — SODIUM CHLORIDE 0.9 % IV SOLN: 250.0000 mL | INTRAVENOUS | Status: DC | PRN

## 2022-10-19 MED ORDER — SODIUM CHLORIDE 0.9% FLUSH: 3.0000 mL | INTRAVENOUS | Status: DC | PRN

## 2022-10-19 MED ORDER — IOHEXOL 300 MG/ML  SOLN
INTRAMUSCULAR | Status: DC | PRN
  Administered 2022-10-19: 15 mL

## 2022-10-19 MED ORDER — FENTANYL CITRATE (PF) 100 MCG/2ML IJ SOLN
INTRAMUSCULAR | Status: DC | PRN
  Administered 2022-10-19 (×3): 25 ug via INTRAVENOUS

## 2022-10-19 MED ORDER — MIDAZOLAM HCL 2 MG/2ML IJ SOLN
INTRAMUSCULAR | Status: AC
  Filled 2022-10-19: qty 2

## 2022-10-19 SURGICAL SUPPLY — 14 items
CATH BALLN WEDGE 5F 110CM (CATHETERS) IMPLANT
CATH INFINITI 5F MPA2 125 (CATHETERS) IMPLANT
CATH INFINITI AMBI 5FR JK (CATHETERS) IMPLANT
DEVICE RAD TR BAND REGULAR (VASCULAR PRODUCTS) IMPLANT
DRAPE BRACHIAL (DRAPES) IMPLANT
DRAPE FEMORAL ANGIO W/ POUCH (DRAPES) IMPLANT
GLIDESHEATH SLEND SS 6F .021 (SHEATH) IMPLANT
GUIDEWIRE INQWIRE 1.5J.035X260 (WIRE) IMPLANT
INQWIRE 1.5J .035X260CM (WIRE) ×1
PACK CARDIAC CATH (CUSTOM PROCEDURE TRAY) ×1 IMPLANT
PROTECTION STATION PRESSURIZED (MISCELLANEOUS) ×1
SET ATX-X65L (MISCELLANEOUS) IMPLANT
SHEATH GLIDE SLENDER 4/5FR (SHEATH) IMPLANT
STATION PROTECTION PRESSURIZED (MISCELLANEOUS) IMPLANT

## 2022-10-19 NOTE — Interval H&P Note (Signed)
History and Physical Interval Note:  10/19/2022 10:33 AM  Brett Marshall  has presented today for surgery, with the diagnosis of R and L Cath   Aortic valve stenosis.  The various methods of treatment have been discussed with the patient and family. After consideration of risks, benefits and other options for treatment, the patient has consented to  Procedure(s): RIGHT/LEFT HEART CATH AND CORONARY ANGIOGRAPHY (Bilateral) as a surgical intervention.  The patient's history has been reviewed, patient examined, no change in status, stable for surgery.  I have reviewed the patient's chart and labs.  Questions were answered to the patient's satisfaction.     Lorine Bears

## 2022-10-20 ENCOUNTER — Encounter: Payer: Self-pay | Admitting: Cardiovascular Disease

## 2022-10-20 ENCOUNTER — Other Ambulatory Visit: Payer: Self-pay | Admitting: *Deleted

## 2022-10-20 ENCOUNTER — Encounter: Payer: Self-pay | Admitting: *Deleted

## 2022-10-20 DIAGNOSIS — I350 Nonrheumatic aortic (valve) stenosis: Secondary | ICD-10-CM

## 2022-10-20 NOTE — Progress Notes (Signed)
     Your surgery and Pre-Admission testing visit will be at [facility] located at [address], [place], [place] [address].  Please let all your doctors (i.e., Primary Care Physician, Cardiologist, Endocrinologist, Pulmonologist) know you are having surgery. You may need clearance for surgery. If you are on blood thinners, notify your surgeon and ask the doctor who prescribed them how long to hold them before surgery.  If you have had a heart test, such as an EKG, stress test, heart ultrasound, etc., or lab work performed outside of [place], please bring copies of these tests to your Pre-Admission testing, if possible.  These departments may contact you before the day of surgery:  Pre-Service Center - insurance/ billing: [phone] Pharmacy- to review your medications: [phone] Pre-Admission Testing- to set an appointment for your visit: [phone]  (Often, these numbers show up as "SPAM" on your phone)  The Pre-Admission Testing (PAT) visit focuses on Anesthesia for your upcoming surgery.  You do NOT need to fast; take your medications as usual. Please arrive 30 minutes early to allow for parking and admitting.  The visit may last up to an hour. Bring a photo ID and medical insurance card. Reschedule if you are sick. ([phone]) and please, NO children under age 16 at the visit.  During the PAT visit:  We will review your medical and surgical history.   You will receive pre-operative instructions, including the time of arrival at the hospital and surgical start time.  We will review what medication(s) you can take on the day of surgery.  After speaking with the nurse, you will have blood drawn and, if needed, a chest x-ray and EKG.  Most lab results from your doctor are good for 30 days, Hemoglobin A1C is good for 60 days. If you cannot talk to the Pharmacy, bring your medications or a list of them to the PST visit.   Infection control for the [facility] requires: All fingernail and toenail products should be removed before the day of surgery.  (SNS, Acrylic, Gel, Polish, Stickers, Press on, and Poly gel nails.)   Parking information:  Address: [facility] - [address], [place], [place] [address]  Please look for signs for entrance A off of [address]. Free valet parking is available Monday-Friday 05:30am-06:00pm     

## 2022-10-20 NOTE — Telephone Encounter (Signed)
I completed the form and extended his time off until August 25 as he will require time to recover after aortic valve replacement.

## 2022-10-22 ENCOUNTER — Other Ambulatory Visit: Payer: BLUE CROSS/BLUE SHIELD

## 2022-10-23 ENCOUNTER — Encounter: Payer: Self-pay | Admitting: Cardiovascular Disease

## 2022-10-23 NOTE — Telephone Encounter (Signed)
Completed forms faxed and scanned in chart Patient informed

## 2022-10-23 NOTE — Telephone Encounter (Signed)
Forms placed back up front for scanning.

## 2022-10-27 NOTE — Pre-Procedure Instructions (Signed)
Surgical Instructions    Your procedure is scheduled on Friday, June 14th.  Report to [facility] Main Entrance "A" at 05:30 A.M., then check in with the Admitting office.  Call this number if you have problems the morning of surgery:  [phone]  If you have any questions prior to your surgery date call [phone]: Open Monday-Friday 8am-4pm If you experience any cold or flu symptoms such as cough, fever, chills, shortness of breath, etc. between now and your scheduled surgery, please notify us at the above number.     Remember:  Do not eat or drink after midnight the night before your surgery     Take these medicines the morning of surgery with A SIP OF WATER  atorvastatin (LIPITOR)  diltiazem (CARDIZEM)  fluticasone-salmeterol (ADVAIR)  isosorbide mononitrate (IMDUR)  metoprolol tartrate (LOPRESSOR)   If needed: acetaminophen (TYLENOL)  albuterol (VENTOLIN HFA)- bring inhaler with you on day of surgery cyclobenzaprine (FLEXERIL)  fluticasone (FLONASE)   As of today, STOP taking any Aleve, Naproxen, Ibuprofen, Motrin, Advil, Goody's, BC's, all herbal medications, fish oil, and all vitamins.  WHAT DO I DO ABOUT MY DIABETES MEDICATION?   Do not take glipiZIDE (GLUCOTROL) the night before surgery (AM dose is okay on 6/13) or  the morning of surgery.  Do not take metFORMIN (GLUCOPHAGE) the morning of surgery.  Hold Jardiance for 72 hours prior to surgery. Last dose 6/10.         HOW TO MANAGE YOUR DIABETES BEFORE AND AFTER SURGERY  Why is it important to control my blood sugar before and after surgery? Improving blood sugar levels before and after surgery helps healing and can limit problems. A way of improving blood sugar control is eating a healthy diet by:  Eating less sugar and carbohydrates  Increasing activity/exercise  Talking with your doctor about reaching your blood sugar goals High blood sugars (greater than 180 mg/dL) can raise your risk of infections  and slow your recovery, so you will need to focus on controlling your diabetes during the weeks before surgery. Make sure that the doctor who takes care of your diabetes knows about your planned surgery including the date and location.  How do I manage my blood sugar before surgery? Check your blood sugar at least 4 times a day, starting 2 days before surgery, to make sure that the level is not too high or low.  Check your blood sugar the morning of your surgery when you wake up and every 2 hours until you get to the Short Stay unit.  If your blood sugar is less than 70 mg/dL, you will need to treat for low blood sugar: Do not take insulin. Treat a low blood sugar (less than 70 mg/dL) with  cup of clear juice (cranberry or apple), 4 glucose tablets, OR glucose gel. Recheck blood sugar in 15 minutes after treatment (to make sure it is greater than 70 mg/dL). If your blood sugar is not greater than 70 mg/dL on recheck, call [phone] for further instructions. Report your blood sugar to the short stay nurse when you get to Short Stay.  If you are admitted to the hospital after surgery: Your blood sugar will be checked by the staff and you will probably be given insulin after surgery (instead of oral diabetes medicines) to make sure you have good blood sugar levels. The goal for blood sugar control after surgery is 80-180 mg/dL.  Do NOT Smoke (Tobacco/Vaping) for 24 hours prior to your procedure.  If you use a CPAP at night, you may bring your mask/headgear for your overnight stay.   Contacts, glasses, piercing's, hearing aid's, dentures or partials may not be worn into surgery, please bring cases for these belongings.    For patients admitted to the hospital, discharge time will be determined by your treatment team.   Patients discharged the day of surgery will not be allowed to drive home, and someone needs to stay with them for 24 hours.  SURGICAL WAITING ROOM  VISITATION Patients having surgery or a procedure may have no more than 2 support people in the waiting area - these visitors may rotate.   Children under the age of 55 must have an adult with them who is not the patient. If the patient needs to stay at the hospital during part of their recovery, the visitor guidelines for inpatient rooms apply. Pre-op nurse will coordinate an appropriate time for 1 support person to accompany patient in pre-op.  This support person may not rotate.   Please refer to the [facility] website for the visitor guidelines for Inpatients (after your surgery is over and you are in a regular room).    Special instructions:   [place]- Preparing For Surgery  Before surgery, you can play an important role. Because skin is not sterile, your skin needs to be as free of germs as possible. You can reduce the number of germs on your skin by washing with CHG (chlorahexidine gluconate) Soap before surgery.  CHG is an antiseptic cleaner which kills germs and bonds with the skin to continue killing germs even after washing.    Oral Hygiene is also important to reduce your risk of infection.  Remember - BRUSH YOUR TEETH THE MORNING OF SURGERY WITH YOUR REGULAR TOOTHPASTE  Please do not use if you have an allergy to CHG or antibacterial soaps. If your skin becomes reddened/irritated stop using the CHG.  Do not shave (including legs and underarms) for at least 48 hours prior to first CHG shower. It is OK to shave your face.  Please follow these instructions carefully.   Shower the NIGHT BEFORE SURGERY and the MORNING OF SURGERY  If you chose to wash your hair, wash your hair first as usual with your normal shampoo.  After you shampoo, rinse your hair and body thoroughly to remove the shampoo.  Use CHG Soap as you would any other liquid soap. You can apply CHG directly to the skin and wash gently with a scrungie or a clean washcloth.   Apply the CHG Soap to your body ONLY  FROM THE NECK DOWN.  Do not use on open wounds or open sores. Avoid contact with your eyes, ears, mouth and genitals (private parts). Wash Face and genitals (private parts)  with your normal soap.   Wash thoroughly, paying special attention to the area where your surgery will be performed.  Thoroughly rinse your body with warm water from the neck down.  DO NOT shower/wash with your normal soap after using and rinsing off the CHG Soap.  Pat yourself dry with a CLEAN TOWEL.  Wear CLEAN PAJAMAS to bed the night before surgery  Place CLEAN SHEETS on your bed the night before your surgery  DO NOT SLEEP WITH PETS.   Day of Surgery: Take a shower with CHG soap. Do not wear jewelry  Do not wear lotions, powders, colognes, or deodorant. Men may shave face and  neck. Do not bring valuables to the hospital. [facility] is not responsible for any belongings or valuables. Wear Clean/Comfortable clothing the morning of surgery Remember to brush your teeth WITH YOUR REGULAR TOOTHPASTE.   Please read over the following fact sheets that you were given.    If you received a COVID test during your pre-op visit  it is requested that you wear a mask when out in public, stay away from anyone that may not be feeling well and notify your surgeon if you develop symptoms. If you have been in contact with anyone that has tested positive in the last 10 days please notify you surgeon.

## 2022-10-28 ENCOUNTER — Ambulatory Visit (HOSPITAL_COMMUNITY)
Admission: RE | Admit: 2022-10-28 | Discharge: 2022-10-28 | Disposition: A | Discharge status: Home / Self Care | Payer: BLUE CROSS/BLUE SHIELD | Source: Ambulatory Visit | Attending: Thoracic Surgery (Cardiothoracic Vascular Surgery) | Admitting: Thoracic Surgery (Cardiothoracic Vascular Surgery)

## 2022-10-28 ENCOUNTER — Other Ambulatory Visit: Payer: Self-pay

## 2022-10-28 ENCOUNTER — Encounter (HOSPITAL_COMMUNITY): Payer: Self-pay

## 2022-10-28 ENCOUNTER — Ambulatory Visit (HOSPITAL_BASED_OUTPATIENT_CLINIC_OR_DEPARTMENT_OTHER)
Admission: RE | Admit: 2022-10-28 | Discharge: 2022-10-28 | Disposition: A | Discharge status: Home / Self Care | Payer: BLUE CROSS/BLUE SHIELD | Source: Ambulatory Visit | Attending: Thoracic Surgery (Cardiothoracic Vascular Surgery) | Admitting: Thoracic Surgery (Cardiothoracic Vascular Surgery)

## 2022-10-28 ENCOUNTER — Encounter (HOSPITAL_COMMUNITY)
Admission: RE | Admit: 2022-10-28 | Discharge: 2022-10-28 | Disposition: A | Discharge status: Home / Self Care | Payer: BLUE CROSS/BLUE SHIELD | Source: Ambulatory Visit | Attending: Thoracic Surgery (Cardiothoracic Vascular Surgery) | Admitting: Thoracic Surgery (Cardiothoracic Vascular Surgery)

## 2022-10-28 VITALS — BP 125/80 | HR 88 | Temp 97.6°F | Resp 19 | Ht 68.0 in | Wt 316.0 lb

## 2022-10-28 DIAGNOSIS — I509 Heart failure, unspecified: Secondary | ICD-10-CM | POA: Insufficient documentation

## 2022-10-28 DIAGNOSIS — J45909 Unspecified asthma, uncomplicated: Secondary | ICD-10-CM | POA: Insufficient documentation

## 2022-10-28 DIAGNOSIS — I2510 Atherosclerotic heart disease of native coronary artery without angina pectoris: Secondary | ICD-10-CM | POA: Insufficient documentation

## 2022-10-28 DIAGNOSIS — G4733 Obstructive sleep apnea (adult) (pediatric): Secondary | ICD-10-CM | POA: Insufficient documentation

## 2022-10-28 DIAGNOSIS — I352 Nonrheumatic aortic (valve) stenosis with insufficiency: Secondary | ICD-10-CM | POA: Insufficient documentation

## 2022-10-28 DIAGNOSIS — E785 Hyperlipidemia, unspecified: Secondary | ICD-10-CM | POA: Insufficient documentation

## 2022-10-28 DIAGNOSIS — Z01818 Encounter for other preprocedural examination: Secondary | ICD-10-CM | POA: Insufficient documentation

## 2022-10-28 DIAGNOSIS — Z833 Family history of diabetes mellitus: Secondary | ICD-10-CM | POA: Diagnosis not present

## 2022-10-28 DIAGNOSIS — E1165 Type 2 diabetes mellitus with hyperglycemia: Secondary | ICD-10-CM | POA: Diagnosis not present

## 2022-10-28 DIAGNOSIS — Z955 Presence of coronary angioplasty implant and graft: Secondary | ICD-10-CM | POA: Diagnosis not present

## 2022-10-28 DIAGNOSIS — K5900 Constipation, unspecified: Secondary | ICD-10-CM | POA: Diagnosis not present

## 2022-10-28 DIAGNOSIS — I350 Nonrheumatic aortic (valve) stenosis: Secondary | ICD-10-CM

## 2022-10-28 DIAGNOSIS — J90 Pleural effusion, not elsewhere classified: Secondary | ICD-10-CM | POA: Diagnosis not present

## 2022-10-28 DIAGNOSIS — Z79899 Other long term (current) drug therapy: Secondary | ICD-10-CM | POA: Diagnosis not present

## 2022-10-28 DIAGNOSIS — I088 Other rheumatic multiple valve diseases: Secondary | ICD-10-CM | POA: Diagnosis not present

## 2022-10-28 DIAGNOSIS — Z4682 Encounter for fitting and adjustment of non-vascular catheter: Secondary | ICD-10-CM | POA: Diagnosis not present

## 2022-10-28 DIAGNOSIS — I10 Essential (primary) hypertension: Secondary | ICD-10-CM | POA: Diagnosis not present

## 2022-10-28 DIAGNOSIS — Z6841 Body Mass Index (BMI) 40.0 and over, adult: Secondary | ICD-10-CM | POA: Diagnosis not present

## 2022-10-28 DIAGNOSIS — I110 Hypertensive heart disease with heart failure: Secondary | ICD-10-CM | POA: Insufficient documentation

## 2022-10-28 DIAGNOSIS — Q244 Congenital subaortic stenosis: Secondary | ICD-10-CM | POA: Diagnosis not present

## 2022-10-28 DIAGNOSIS — E871 Hypo-osmolality and hyponatremia: Secondary | ICD-10-CM | POA: Diagnosis not present

## 2022-10-28 DIAGNOSIS — J4520 Mild intermittent asthma, uncomplicated: Secondary | ICD-10-CM | POA: Diagnosis not present

## 2022-10-28 DIAGNOSIS — I422 Other hypertrophic cardiomyopathy: Secondary | ICD-10-CM | POA: Diagnosis not present

## 2022-10-28 DIAGNOSIS — K219 Gastro-esophageal reflux disease without esophagitis: Secondary | ICD-10-CM | POA: Diagnosis present

## 2022-10-28 DIAGNOSIS — Z1152 Encounter for screening for COVID-19: Secondary | ICD-10-CM | POA: Insufficient documentation

## 2022-10-28 DIAGNOSIS — Z952 Presence of prosthetic heart valve: Secondary | ICD-10-CM | POA: Diagnosis not present

## 2022-10-28 DIAGNOSIS — R918 Other nonspecific abnormal finding of lung field: Secondary | ICD-10-CM | POA: Diagnosis not present

## 2022-10-28 DIAGNOSIS — F32A Depression, unspecified: Secondary | ICD-10-CM | POA: Diagnosis not present

## 2022-10-28 DIAGNOSIS — I5032 Chronic diastolic (congestive) heart failure: Secondary | ICD-10-CM | POA: Insufficient documentation

## 2022-10-28 DIAGNOSIS — E11649 Type 2 diabetes mellitus with hypoglycemia without coma: Secondary | ICD-10-CM | POA: Diagnosis not present

## 2022-10-28 DIAGNOSIS — D696 Thrombocytopenia, unspecified: Secondary | ICD-10-CM | POA: Diagnosis not present

## 2022-10-28 DIAGNOSIS — J9811 Atelectasis: Secondary | ICD-10-CM | POA: Diagnosis not present

## 2022-10-28 DIAGNOSIS — I358 Other nonrheumatic aortic valve disorders: Secondary | ICD-10-CM | POA: Diagnosis not present

## 2022-10-28 DIAGNOSIS — K7581 Nonalcoholic steatohepatitis (NASH): Secondary | ICD-10-CM | POA: Diagnosis not present

## 2022-10-28 DIAGNOSIS — I428 Other cardiomyopathies: Secondary | ICD-10-CM | POA: Diagnosis not present

## 2022-10-28 DIAGNOSIS — I272 Pulmonary hypertension, unspecified: Secondary | ICD-10-CM | POA: Diagnosis not present

## 2022-10-28 DIAGNOSIS — E119 Type 2 diabetes mellitus without complications: Secondary | ICD-10-CM | POA: Insufficient documentation

## 2022-10-28 DIAGNOSIS — I700 Atherosclerosis of aorta: Secondary | ICD-10-CM | POA: Diagnosis not present

## 2022-10-28 DIAGNOSIS — I25119 Atherosclerotic heart disease of native coronary artery with unspecified angina pectoris: Secondary | ICD-10-CM | POA: Diagnosis not present

## 2022-10-28 HISTORY — DX: Headache, unspecified: R51.9

## 2022-10-28 HISTORY — DX: Cardiac murmur, unspecified: R01.1

## 2022-10-28 LAB — COMPREHENSIVE METABOLIC PANEL
ALT: 17 U/L (ref 0–44)
AST: 24 U/L (ref 15–41)
Albumin: 3.3 g/dL — ABNORMAL LOW (ref 3.5–5.0)
Alkaline Phosphatase: 100 U/L (ref 38–126)
Anion gap: 13 (ref 5–15)
BUN: 15 mg/dL (ref 6–20)
CO2: 17 mmol/L — ABNORMAL LOW (ref 22–32)
Calcium: 8.4 mg/dL — ABNORMAL LOW (ref 8.9–10.3)
Chloride: 102 mmol/L (ref 98–111)
Creatinine, Ser: 1.2 mg/dL (ref 0.61–1.24)
GFR, Estimated: >60 mL/min (ref >60)
Glucose, Bld: 198 mg/dL — ABNORMAL HIGH (ref 70–99)
Potassium: 4.1 mmol/L (ref 3.5–5.1)
Sodium: 132 mmol/L — ABNORMAL LOW (ref 135–145)
Total Bilirubin: 0.9 mg/dL (ref 0.3–1.2)
Total Protein: 7.1 g/dL (ref 6.5–8.1)

## 2022-10-28 LAB — URINALYSIS, ROUTINE W REFLEX MICROSCOPIC
Bilirubin Urine: NEGATIVE
Glucose, UA: >=500 mg/dL — AB
Ketones, ur: NEGATIVE mg/dL
Nitrite: NEGATIVE
Protein, ur: NEGATIVE mg/dL
Specific Gravity, Urine: 1.027 (ref 1.005–1.030)
pH: 5 (ref 5.0–8.0)

## 2022-10-28 LAB — CBC
HCT: 41.6 % (ref 39.0–52.0)
Hemoglobin: 13.6 g/dL (ref 13.0–17.0)
MCH: 28 pg (ref 26.0–34.0)
MCHC: 32.7 g/dL (ref 30.0–36.0)
MCV: 85.6 fL (ref 80.0–100.0)
Platelets: 222 10*3/uL (ref 150–400)
RBC: 4.86 MIL/uL (ref 4.22–5.81)
RDW: 14.3 % (ref 11.5–15.5)
WBC: 12.2 10*3/uL — ABNORMAL HIGH (ref 4.0–10.5)
nRBC: 0 % (ref 0.0–0.2)

## 2022-10-28 LAB — SARS CORONAVIRUS 2 (TAT 6-24 HRS): SARS Coronavirus 2: NEGATIVE

## 2022-10-28 LAB — PROTIME-INR
INR: 1.1 (ref 0.8–1.2)
Prothrombin Time: 13.9 seconds (ref 11.4–15.2)

## 2022-10-28 LAB — GLUCOSE, CAPILLARY: Glucose-Capillary: 176 mg/dL — ABNORMAL HIGH (ref 70–99)

## 2022-10-28 LAB — BLOOD GAS, ARTERIAL
Acid-base deficit: 3.1 mmol/L — ABNORMAL HIGH (ref 0.0–2.0)
Bicarbonate: 21.2 mmol/L (ref 20.0–28.0)
Drawn by: 58793
O2 Saturation: 100 %
Patient temperature: 37
pCO2 arterial: 35 mmHg (ref 32–48)
pH, Arterial: 7.39 (ref 7.35–7.45)
pO2, Arterial: 127 mmHg — ABNORMAL HIGH (ref 83–108)

## 2022-10-28 LAB — SURGICAL PCR SCREEN
MRSA, PCR: NEGATIVE
Staphylococcus aureus: POSITIVE — AB

## 2022-10-28 LAB — APTT: aPTT: 26 seconds (ref 24–36)

## 2022-10-28 LAB — HEMOGLOBIN A1C
Hgb A1c MFr Bld: 10 % — ABNORMAL HIGH (ref 4.8–5.6)
Mean Plasma Glucose: 240.3 mg/dL

## 2022-10-28 NOTE — Progress Notes (Signed)
PCP - Dr. Nilda Simmer Cardiologist - Dr. Lorine Bears  PPM/ICD - denies   Chest x-ray - 10/28/22 EKG - 10/28/22 Stress Test - 09/06/21 ECHO - 09/10/22 Cardiac Cath - 10/19/22  Sleep Study - OSA+ CPAP - nightly  Fasting Blood Sugar - 160-180 Pt has not been checking CBG at home. He says that he has a continuous sensor he is going to start using once the surgery is over, but has stopped checking it in the meantime.  Last dose of GLP1 agonist-  n/a   Blood Thinner Instructions: n/a Aspirin Instructions: Hold DOS  ERAS Protcol - no, NPO   COVID TEST- 10/28/22   Anesthesia review: yes, cardiac hx  Patient denies shortness of breath, fever, cough and chest pain at PAT appointment   All instructions explained to the patient, with a verbal understanding of the material. Patient agrees to go over the instructions while at home for a better understanding. Patient also instructed to wear a mask in public after being tested for COVID-19. The opportunity to ask questions was provided.

## 2022-10-28 NOTE — Progress Notes (Signed)
Carotid artery duplex has been completed. Preliminary results can be found in CV Proc through chart review.   10/28/22 10:22 AM Olen Cordial RVT

## 2022-10-29 MED ORDER — PHENYLEPHRINE HCL-NACL 20-0.9 MG/250ML-% IV SOLN
30.0000 ug/min | INTRAVENOUS | Status: DC
  Filled 2022-10-29: qty 250

## 2022-10-29 MED ORDER — NOREPINEPHRINE 4 MG/250ML-% IV SOLN
0.0000 ug/min | INTRAVENOUS | Status: DC
  Filled 2022-10-29: qty 250

## 2022-10-29 MED ORDER — TRANEXAMIC ACID (OHS) BOLUS VIA INFUSION
15.0000 mg/kg | INTRAVENOUS | Status: AC
  Administered 2022-10-30: 2149.5 mg via INTRAVENOUS
  Filled 2022-10-29: qty 2150

## 2022-10-29 MED ORDER — TRANEXAMIC ACID 1000 MG/10ML IV SOLN
1.5000 mg/kg/h | INTRAVENOUS | Status: AC
  Administered 2022-10-30 (×2): 1.5 mg/kg/h via INTRAVENOUS
  Filled 2022-10-29: qty 25

## 2022-10-29 MED ORDER — CEFAZOLIN SODIUM-DEXTROSE 2-4 GM/100ML-% IV SOLN
2.0000 g | INTRAVENOUS | Status: DC
  Filled 2022-10-29: qty 100

## 2022-10-29 MED ORDER — HEPARIN 30,000 UNITS/1000 ML (OHS) CELLSAVER SOLUTION
Status: DC
  Filled 2022-10-29: qty 1000

## 2022-10-29 MED ORDER — MANNITOL 20 % IV SOLN
INTRAVENOUS | Status: DC
  Filled 2022-10-29: qty 13

## 2022-10-29 MED ORDER — INSULIN REGULAR(HUMAN) IN NACL 100-0.9 UT/100ML-% IV SOLN
INTRAVENOUS | Status: AC
  Administered 2022-10-30: 8 [IU]/h via INTRAVENOUS
  Filled 2022-10-29: qty 100

## 2022-10-29 MED ORDER — VANCOMYCIN HCL 1500 MG/300ML IV SOLN
1500.0000 mg | INTRAVENOUS | Status: AC
  Administered 2022-10-30: 1500 mg via INTRAVENOUS
  Filled 2022-10-29: qty 300

## 2022-10-29 MED ORDER — NITROGLYCERIN IN D5W 200-5 MCG/ML-% IV SOLN
2.0000 ug/min | INTRAVENOUS | Status: DC
  Filled 2022-10-29: qty 250

## 2022-10-29 MED ORDER — VANCOMYCIN HCL 1000 MG IV SOLR
INTRAVENOUS | Status: DC
  Filled 2022-10-29: qty 20

## 2022-10-29 MED ORDER — PLASMA-LYTE A IV SOLN
INTRAVENOUS | Status: DC
  Filled 2022-10-29: qty 2.5

## 2022-10-29 MED ORDER — VANCOMYCIN HCL 1250 MG/250ML IV SOLN
1250.0000 mg | INTRAVENOUS | Status: DC
  Filled 2022-10-29: qty 250

## 2022-10-29 MED ORDER — DEXMEDETOMIDINE HCL IN NACL 400 MCG/100ML IV SOLN
0.1000 ug/kg/h | INTRAVENOUS | Status: AC
  Administered 2022-10-30: 1 ug/kg/h via INTRAVENOUS
  Administered 2022-10-30: .5 ug/kg/h via INTRAVENOUS
  Filled 2022-10-29: qty 100

## 2022-10-29 MED ORDER — MAGNESIUM SULFATE 50 % IJ SOLN
40.0000 meq | INTRAMUSCULAR | Status: DC
  Filled 2022-10-29: qty 9.85

## 2022-10-29 MED ORDER — TRANEXAMIC ACID (OHS) PUMP PRIME SOLUTION
2.0000 mg/kg | INTRAVENOUS | Status: DC
  Filled 2022-10-29: qty 2.87

## 2022-10-29 MED ORDER — MILRINONE LACTATE IN DEXTROSE 20-5 MG/100ML-% IV SOLN
0.3000 ug/kg/min | INTRAVENOUS | Status: DC
  Filled 2022-10-29: qty 100

## 2022-10-29 MED ORDER — EPINEPHRINE HCL 5 MG/250ML IV SOLN IN NS
0.0000 ug/min | INTRAVENOUS | Status: DC
  Filled 2022-10-29: qty 250

## 2022-10-29 MED ORDER — POTASSIUM CHLORIDE 2 MEQ/ML IV SOLN
80.0000 meq | INTRAVENOUS | Status: DC
  Filled 2022-10-29: qty 40

## 2022-10-29 MED ORDER — CEFAZOLIN IN SODIUM CHLORIDE 3-0.9 GM/100ML-% IV SOLN
3.0000 g | INTRAVENOUS | Status: AC
  Administered 2022-10-30 (×2): 3 g via INTRAVENOUS
  Filled 2022-10-29: qty 100

## 2022-10-29 NOTE — Anesthesia Preprocedure Evaluation (Addendum)
Anesthesia Evaluation  Patient identified by MRN, date of birth, ID band Patient awake    Reviewed: Allergy & Precautions, NPO status , Patient's Chart, lab work & pertinent test results  Airway Mallampati: III  TM Distance: >3 FB Neck ROM: Full    Dental   Pulmonary    breath sounds clear to auscultation       Cardiovascular hypertension, Pt. on medications and Pt. on home beta blockers + angina  + CAD and + Cardiac Stents  + Valvular Problems/Murmurs AS  Rhythm:Regular Rate:Normal     Neuro/Psych    GI/Hepatic ,GERD  ,,(+) Hepatitis -  Endo/Other  diabetes, Type 2, Oral Hypoglycemic Agents  Morbid obesity  Renal/GU Renal disease     Musculoskeletal  (+) Arthritis ,    Abdominal   Peds  Hematology negative hematology ROS (+)   Anesthesia Other Findings   Reproductive/Obstetrics                             Anesthesia Physical Anesthesia Plan  ASA: 4  Anesthesia Plan: General   Post-op Pain Management:    Induction: Intravenous  PONV Risk Score and Plan: 2 and Dexamethasone, Ondansetron and Treatment may vary due to age or medical condition  Airway Management Planned: Oral ETT  Additional Equipment: Arterial line, CVP, PA Cath, TEE and Ultrasound Guidance Line Placement  Intra-op Plan:   Post-operative Plan: Post-operative intubation/ventilation  Informed Consent: I have reviewed the patients History and Physical, chart, labs and discussed the procedure including the risks, benefits and alternatives for the proposed anesthesia with the patient or authorized representative who has indicated his/her understanding and acceptance.     Dental advisory given  Plan Discussed with: CRNA  Anesthesia Plan Comments: (  )       Anesthesia Quick Evaluation

## 2022-10-29 NOTE — Progress Notes (Signed)
Anesthesia Chart Review:  Case: [id number] Date/Time: 10/30/22 0715   Procedures:      AORTIC VALVE REPLACEMENT (AVR) (Chest)     TRANSESOPHAGEAL ECHOCARDIOGRAM   Anesthesia type: General   Pre-op diagnosis: SEVERE AS   [facility] ROOM 15 / [facility] OR   Surgeons: Eugenio Hoes, MD       DISCUSSION: Patient is a 57 year old [place] scheduled for the above procedure.  History includes never smoker, HTN, HLD, DM2, asthma, CAD (DES ostial LAD 09/08/21), mixed ischemic and nonischemic cardiomyopathy, CHF, aortic stenosis, pulmonary hypertension (mild 10/19/22 RHC), OSA (uses CPAP), morbid obesity.   A1c 10.0%, down from 11.7%. Labs are marked as reviewed by Dr. Leafy Ro. UA results already communicated to Dr. Leafy Ro without new orders. Last Jardiance 10/26/22.   10/28/2022 presurgical COVID-19 test was negative. CXR report is still in process.   Anesthesia team to evaluate on the day of surgery.   VS: BP 125/80   Pulse 88   Temp 36.4 C   Resp 19   Ht 5\' 8"  (1.727 m)   Wt (!) 143.3 kg   SpO2 99%   BMI 48.05 kg/m   PROVIDERS: Ethelda Chick, MD is PCP  Lorine Bears, MD is cardiologist   LABS: Preoperative labs noted. A1c 10.0% (down from 11.7%). (all labs ordered are listed, but only abnormal results are displayed)  Labs Reviewed  SURGICAL PCR SCREEN - Abnormal; Notable for the following components:      Result Value   Staphylococcus aureus POSITIVE (*)    All other components within normal limits  GLUCOSE, CAPILLARY - Abnormal; Notable for the following components:   Glucose-Capillary 176 (*)    All other components within normal limits  CBC - Abnormal; Notable for the following components:   WBC 12.2 (*)    All other components within normal limits  COMPREHENSIVE METABOLIC PANEL - Abnormal; Notable for the following components:   Sodium 132 (*)    CO2 17 (*)    Glucose, Bld 198 (*)    Calcium 8.4 (*)    Albumin 3.3 (*)    All other components within normal limits   URINALYSIS, ROUTINE W REFLEX MICROSCOPIC - Abnormal; Notable for the following components:   APPearance CLOUDY (*)    Glucose, UA >=500 (*)    Hgb urine dipstick MODERATE (*)    Leukocytes,Ua LARGE (*)    Bacteria, UA MANY (*)    All other components within normal limits  BLOOD GAS, ARTERIAL - Abnormal; Notable for the following components:   pO2, Arterial 127 (*)    Acid-base deficit 3.1 (*)    All other components within normal limits  HEMOGLOBIN A1C - Abnormal; Notable for the following components:   Hgb A1c MFr Bld 10.0 (*)    All other components within normal limits  SARS CORONAVIRUS 2 (TAT 6-24 HRS)  PROTIME-INR  APTT  TYPE AND SCREEN     IMAGES: CXR 10/28/22: In process.  EKG: 10/28/22: Normal sinus rhythm Septal infarct , age undetermined   CV: US Carotid 10/28/22: Summary:  - Right Carotid: Velocities in the right ICA are consistent with a 1-39%  stenosis.  - Left Carotid: Velocities in the left ICA are consistent with a 1-39%  stenosis.  - Vertebrals: Bilateral vertebral arteries demonstrate antegrade flow.    RHC/LHC 10/18/22:   Mid LAD lesion is 20% stenosed.   Dist LAD lesion is 60% stenosed.   Prox RCA lesion is 50% stenosed.   Non-stenotic Ost LAD  lesion was previously treated.   1.  Widely patent LAD stent with no significant restenosis.  The right coronary artery is known to be codominant and small in size with moderate proximal disease.  It was not injected. 2.  Left ventricular angiography was not performed.  EF was normal by echo. Right heart catheterization showed mildly elevated wedge pressure at 18 mmHg, mild pulmonary hypertension and normal cardiac output. 3.  Severe aortic stenosis with mean gradient of 31.5 mmHg and calculated valve area of 1.05 cm and aortic valve indexed area of 0.42.  No significant LVOT gradient was noted with slow pullback.   Recommendations: Aortic valve area is consistent with moderate to severe aortic stenosis.   However, when adjusting to his body surface area, it is in the severe range and I suspect that this is the likely etiology for his symptoms. Recommend aortic valve replacement.   MRI Cardiac 10/15/21: IMPRESSION: 1.  Normal LV size and function, LVEF= 60%. 2.  No LVOT obstruction or gradient noted. 3.  Mild concentric LV wall thickness. 4.  Mild aortic valve stenosis. 5.  No evidence for myocardial fibrosis or scar. 6.  No evidence for HCM. ADDENDUM: CMR again reviewed today, there appears to be an aortic subvalvular membrane. This may explain the gradient on LHC. Consider TEE for gradient assessment. Mild aortic valve stenosis, no HCM.    CT Coronary 10/01/22: IMPRESSION: 1 coronary calcium score not performed due to prior LAD stent Left dominant cors with patent LAD stent 2.Aortic valve calcium score 447 Valve appears bicuspid with calcium only in non coronary cusp and partial raphe between right and left cusps. Marked thickening of leaflet tips with systolic doming 3.  Normal ascending thoracic aorta with no coarctation 4. Optimum angiographic angle for deployment LAO 13 Cranial 10 degrees 5. Valve only sizes to a 23 mm Sapien 3 Medtronic Evolut sizes to 26 mm valve but not ideal given young age and LAD stent Despite patients size may be better served by surgical consultation. Calcium score very low and would need industry to review with embedded geometry as valve anchoring may be more above annulus   TEE 09/10/22: IMPRESSIONS   1. Left ventricular ejection fraction, by estimation, is 60 to 65%. The  left ventricle has normal function. The left ventricle has no regional  wall motion abnormalities. There is mild concentric LVH with moderate  asymmetric left ventricular hypertrophy  of the basal-septal segment.   2. Right ventricular systolic function is normal. The right ventricular  size is normal.   3. The mitral valve is normal in structure. No evidence of mitral valve   regurgitation. No evidence of mitral stenosis.   4. The aortic valve is tricuspid. Select images suggesting mild valve  prolapse LCC. There is mild calcification of the aortic valve. Aortic  valve regurgitation is moderate. Visually with mild aortic valve stenosis.  No subvalvular membrane noted.   5. The inferior vena cava is normal in size with greater than 50%  respiratory variability, suggesting right atrial pressure of 3 mmHg.   6. No left atrial/left atrial appendage thrombus was detected.   7. Agitated saline contrast bubble study was negative, with no evidence  of any interatrial shunt.  - Conclusion(s)/Recommendation(s): Normal biventricular function without  evidence of hemodynamically significant valvular heart disease.    Venous US BLE 09/07/22: IMPRESSION: No evidence of deep venous thrombosis in either lower extremity.    Past Medical History:  Diagnosis Date   Asthma  CAD (coronary artery disease)    a. 08/2017 Cath: Diff nonobs dzs, EF 45-50%, diff HK; b. 08/2021 MV: Ant/apical ishemia; c. 08/2021 PCI: LAD 95ost (4.0x15 Onyx Frontier DES), EF 55-65%; c. 09/2021 Cath: LM nl, LAD patent stent, 70m, 30d, RI min irregs, LCX min irregs, RCA 50p. LVOT grad 50-28mmHg (rest), 70-171mmHg (provoked).   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 08/2017 LV gram: EF 45-50%, glob HK; b. 08/2021 Echo: EF 60-65%, no rwma, GrI DD, nl RV fxn; c. 09/2021 cMRI: EF 60%, no LVOT obstruction/gradient. Mild conc LVH, mild AS. No myocardial scar/fibrosis. No evidence of HCM.   Hyperlipidemia    Leg swelling    Left   Low back pain    Mixed ICM & NICM    a. 08/2017 LV gram: EF 45-50%, glob HK; b. 08/2021 Echo: EF 60-65%.   Morbid obesity (HCC)    OSA on CPAP    non  compliant   Pulmonary hypertension (HCC)     Past Surgical History:  Procedure Laterality Date   CARDIAC CATHETERIZATION     [place] no stents.   COLONOSCOPY WITH PROPOFOL N/A 04/11/2019   Procedure: COLONOSCOPY WITH PROPOFOL;  Surgeon: Midge Minium, MD;  Location: [facility] ENDOSCOPY;  Service: Endoscopy;  Laterality: N/A;   CORONARY STENT INTERVENTION N/A 09/08/2021   Procedure: CORONARY STENT INTERVENTION;  Surgeon: [name], MD;  Location: [facility] INVASIVE CV LAB;  Service: Cardiovascular;  Laterality: N/A;   CORONARY ULTRASOUND/IVUS N/A 09/08/2021   Procedure: Intravascular Ultrasound/IVUS;  Surgeon: [name], MD;  Location: [facility] INVASIVE CV LAB;  Service: Cardiovascular;  Laterality: N/A;   LEFT HEART CATH AND CORONARY ANGIOGRAPHY N/A 09/08/2021   Procedure: LEFT HEART CATH AND CORONARY ANGIOGRAPHY;  Surgeon: [name], MD;  Location: [facility] INVASIVE CV LAB;  Service: Cardiovascular;  Laterality: N/A;   LEFT HEART CATH AND CORONARY ANGIOGRAPHY N/A 09/17/2021   Procedure: LEFT HEART CATH AND CORONARY ANGIOGRAPHY;  Surgeon: Yvonne Kendall, MD;  Location: [facility] INVASIVE CV LAB;  Service: Cardiovascular;  Laterality: N/A;   LEFT HEART CATH AND CORONARY ANGIOGRAPHY N/A 09/08/2022   Procedure: LEFT HEART CATH AND CORONARY ANGIOGRAPHY;  Surgeon: Yvonne Kendall, MD;  Location: [facility] INVASIVE CV LAB;  Service: Cardiovascular;  Laterality: N/A;   RIGHT/LEFT HEART CATH AND CORONARY ANGIOGRAPHY N/A 09/02/2017   Procedure: RIGHT/LEFT HEART CATH AND CORONARY ANGIOGRAPHY;  Surgeon: [name], MD;  Location: [facility] INVASIVE CV LAB;  Service: Cardiovascular;  Laterality: N/A;   RIGHT/LEFT HEART CATH AND CORONARY ANGIOGRAPHY Bilateral 10/19/2022   Procedure: RIGHT/LEFT HEART CATH AND CORONARY ANGIOGRAPHY;  Surgeon: [name], MD;  Location: [facility] INVASIVE CV LAB;  Service: Cardiovascular;  Laterality: Bilateral;   TEE WITHOUT CARDIOVERSION N/A 09/10/2022   Procedure:  TRANSESOPHAGEAL ECHOCARDIOGRAM;  Surgeon: Antonieta Iba, MD;  Location: [facility] ORS;  Service: Cardiovascular;  Laterality: N/A;    MEDICATIONS:  acetaminophen (TYLENOL) 325 MG tablet   albuterol (VENTOLIN HFA) 108 (90 Base) MCG/ACT inhaler   aspirin EC 81 MG tablet   atorvastatin (LIPITOR) 40 MG tablet   cyclobenzaprine (FLEXERIL) 10 MG tablet   diltiazem (CARDIZEM) 30 MG tablet   DULoxetine (CYMBALTA) 60 MG capsule   famotidine (PEPCID) 40  MG tablet   fluticasone (FLONASE) 50 MCG/ACT nasal spray   fluticasone-salmeterol (ADVAIR) 250-50 MCG/ACT AEPB   furosemide (LASIX) 20 MG tablet   gabapentin (NEURONTIN) 300 MG capsule   glipiZIDE (GLUCOTROL) 5 MG tablet   glucose blood (GE100 BLOOD GLUCOSE TEST) test strip   isosorbide mononitrate (IMDUR) 30 MG 24 hr tablet   JARDIANCE 25 MG TABS tablet   Lancets (ONETOUCH ULTRASOFT) lancets   metFORMIN (GLUCOPHAGE) 1000 MG tablet   metoprolol tartrate (LOPRESSOR) 100 MG tablet   traZODone (DESYREL) 50 MG tablet   No current facility-administered medications for this encounter.    [START ON 10/30/2022] ceFAZolin (ANCEF) IVPB 2g/100 mL premix   [START ON 10/30/2022] ceFAZolin (ANCEF) IVPB 3g/100 mL premix   [START ON 10/30/2022] dexmedetomidine (PRECEDEX) 400 MCG/100ML (4 mcg/mL) infusion   [START ON 10/30/2022] EPINEPHrine (ADRENALIN) 5 mg in NS 250 mL (0.02 mg/mL) premix infusion   [START ON 10/30/2022] heparin 30,000 units/NS 1000 mL solution for CELLSAVER   [START ON 10/30/2022] heparin sodium (porcine) 2,500 Units, papaverine 30 mg in electrolyte-A (PLASMALYTE-A PH 7.4) 500 mL irrigation   [START ON 10/30/2022] insulin regular, human (MYXREDLIN) 100 units/ 100 mL infusion   [START ON 10/30/2022] magnesium sulfate (IV Push/IM) injection 40 mEq   [START ON 10/30/2022] milrinone (PRIMACOR) 20 MG/100 ML (0.2 mg/mL) infusion   [START ON 10/30/2022] nitroGLYCERIN 50 mg in dextrose 5 % 250 mL (0.2 mg/mL) infusion   [START ON 10/30/2022] norepinephrine  (LEVOPHED) 4mg  in (0.016 mg/mL) premix infusion   [START ON 10/30/2022] phenylephrine (NEO-SYNEPHRINE) 20mg /NS premix infusion   [START ON 10/30/2022] potassium chloride injection 80 mEq   [START ON 10/30/2022] tranexamic acid (CYKLOKAPRON) 2,500 mg in sodium chloride 0.9 % 250 mL (10 mg/mL) infusion   [START ON 10/30/2022] tranexamic acid (CYKLOKAPRON) bolus via infusion - over 30 minutes 2,149.5 mg   [START ON 10/30/2022] tranexamic acid (CYKLOKAPRON) pump prime solution 287 mg   [START ON 10/30/2022] vancomycin (VANCOCIN) 1,000 mg in sodium chloride 0.9 % 1,000 mL irrigation   [START ON 10/30/2022] vancomycin (VANCOREADY) IVPB 1500 mg/300 mL    Shonna Chock, PA-C Surgical Short Stay/Anesthesiology [facility] Phone [phone] [facility] Phone [phone] 10/29/2022 11:45 AM

## 2022-10-29 NOTE — H&P (Signed)
[address]       [place] [address]             [phone]                                   Brett Marshall [facility] Medical Record #[id number] Date of Birth: [birth year]   Brett Ouch, MD Brett Chick, MD   Chief Complaint:  constant cp and sob    History of Present Illness:     Pt is a pleasant 57 yo male who has an extensive history of CAD and possible LVOT obstruction. Pt is sp prox LAD stenting 2023 and with 20-30 mm gradient on LVOT. He did well for several months but returned with more CP and cath with patent LAD stent but with 80 mmHg gradient in LVOT. BB was started and pt has what he reports as daily CP central radiating up to neck and left shoulder. Comes with or without exertion. This is associated also with SOB that will be with just sitting. He was worked up most recently with ECHO, TEE, cath and TAVR CTA and with conflicting evidence of mild noncalcific AS (maybe bicuspid valve) and no LVOT obstruction. No recurrent CAD. Pt was reviewed by several other cardiologists and concensus was that his AS (bicuspid) the culprit and CTS consulted. Pt understands the complexity of his situation             Past Medical History:  Diagnosis Date   Asthma     CAD (coronary artery disease)      a. 08/2017 Cath: Diff nonobs dzs, EF 45-50%, diff HK; b. 08/2021 MV: Ant/apical ishemia; c. 08/2021 PCI: LAD 95ost (4.0x15 Onyx Frontier DES), EF 55-65%; c. 09/2021 Cath: LM nl, LAD patent stent, 52m, 30d, RI min irregs, LCX min irregs, RCA 50p. LVOT grad 50-31mmHg (rest), 70-128mmHg (provoked).   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 08/2017 LV gram: EF 45-50%, glob HK; b. 08/2021 Echo: EF 60-65%, no rwma, GrI DD, nl RV fxn; c. 09/2021 cMRI: EF 60%, no LVOT obstruction/gradient.  Mild conc LVH, mild AS. No myocardial scar/fibrosis. No evidence of HCM.   Hyperlipidemia     Leg swelling      Left   Low back pain     Mixed ICM & NICM      a. 08/2017 LV gram: EF 45-50%, glob HK; b. 08/2021 Echo: EF 60-65%.   Morbid obesity (HCC)     OSA on CPAP      non compliant   Pulmonary hypertension (HCC)             Past Surgical History:  Procedure Laterality Date   CARDIAC CATHETERIZATION        [place] no stents.   COLONOSCOPY WITH PROPOFOL N/A 04/11/2019    Procedure: COLONOSCOPY WITH PROPOFOL;  Surgeon: Brett Minium, MD;  Location: [facility] ENDOSCOPY;  Service: Endoscopy;  Laterality: N/A;   CORONARY STENT INTERVENTION N/A 09/08/2021    Procedure: CORONARY STENT INTERVENTION;  Surgeon: Brett Ouch, MD;  Location: [facility] INVASIVE CV LAB;  Service: Cardiovascular;  Laterality: N/A;   CORONARY ULTRASOUND/IVUS N/A  09/08/2021    Procedure: Intravascular Ultrasound/IVUS;  Surgeon: Brett Ouch, MD;  Location: [facility] INVASIVE CV LAB;  Service: Cardiovascular;  Laterality: N/A;   LEFT HEART CATH AND CORONARY ANGIOGRAPHY N/A 09/08/2021    Procedure: LEFT HEART CATH AND CORONARY ANGIOGRAPHY;  Surgeon: Brett Ouch, MD;  Location: [facility] INVASIVE CV LAB;  Service: Cardiovascular;  Laterality: N/A;   LEFT HEART CATH AND CORONARY ANGIOGRAPHY N/A 09/17/2021    Procedure: LEFT HEART CATH AND CORONARY ANGIOGRAPHY;  Surgeon: Brett Kendall, MD;  Location: [facility] INVASIVE CV LAB;  Service: Cardiovascular;  Laterality: N/A;   LEFT HEART CATH AND CORONARY ANGIOGRAPHY N/A 09/08/2022    Procedure: LEFT HEART CATH AND CORONARY ANGIOGRAPHY;  Surgeon: Brett Kendall, MD;  Location: [facility] INVASIVE CV LAB;  Service: Cardiovascular;  Laterality: N/A;   RIGHT/LEFT HEART CATH AND CORONARY ANGIOGRAPHY N/A 09/02/2017    Procedure: RIGHT/LEFT HEART CATH AND CORONARY ANGIOGRAPHY;  Surgeon: Brett Ouch, MD;  Location: [facility] INVASIVE CV LAB;  Service: Cardiovascular;  Laterality: N/A;   TEE WITHOUT  CARDIOVERSION N/A 09/10/2022    Procedure: TRANSESOPHAGEAL ECHOCARDIOGRAM;  Surgeon: Brett Iba, MD;  Location: [facility] ORS;  Service: Cardiovascular;  Laterality: N/A;      Social History       Tobacco Use  Smoking Status Never  Smokeless Tobacco Never    Social History        Substance and Sexual Activity  Alcohol Use Yes    Comment: few times per year/ socially      Social History         Socioeconomic History   Marital status: Married      Spouse name: Not on file   Number of children: Not on file   Years of education: Not on file   Highest education level: Not on file  Occupational History   Occupation: Quarry manager      Employer: [employer]  Tobacco Use   Smoking status: Never   Smokeless tobacco: Never  Vaping Use   Vaping Use: Never used  Substance and Sexual Activity   Alcohol use: Yes      Comment: few times per year/ socially   Drug use: No   Sexual activity: Yes  Other Topics Concern   Not on file  Social History Narrative    Goes to gym.    Social Determinants of Health        Financial Resource Strain: Not on file  Food Insecurity: No Food Insecurity (09/07/2022)    Hunger Vital Sign     Worried About Running Out of Food in the Last Year: Never true     Ran Out of Food in the Last Year: Never true  Transportation Needs: No Transportation Needs (09/07/2022)    PRAPARE - Therapist, art (Medical): No     Lack of Transportation (Non-Medical): No  Physical Activity: Not on file  Stress: Not on file  Social Connections: Not on file  Intimate Partner Violence: Not At Risk (09/07/2022)    Humiliation, Afraid, Rape, and Kick questionnaire     Fear of Current or Ex-Partner: No     Emotionally Abused: No     Physically Abused: No     Sexually Abused: No      No Known Allergies         Current Outpatient Medications  Medication Sig Dispense Refill   albuterol (VENTOLIN HFA) 108 (90 Base) MCG/ACT inhaler Inhale 1-2  puffs into the lungs  every 6 (six) hours as needed for wheezing or shortness of breath. 18 g 1   aspirin EC 81 MG tablet Take 1 tablet (81 mg total) by mouth daily. Swallow whole. 30 tablet 6   atorvastatin (LIPITOR) 40 MG tablet Take 1 tablet (40 mg total) by mouth daily at 6 PM. 30 tablet 2   cephALEXin (KEFLEX) 500 MG capsule Take 1 capsule (500 mg total) by mouth every 12 (twelve) hours. 5 capsule 0   cyclobenzaprine (FLEXERIL) 10 MG tablet Take 1 tablet (10 mg total) by mouth 3 (three) times daily as needed.For spasms 90 tablet 5   diltiazem (CARDIZEM) 30 MG tablet Take 1 tablet (30 mg total) by mouth every 12 (twelve) hours. 60 tablet 1   DULoxetine (CYMBALTA) 60 MG capsule Take 120 mg by mouth daily.       famotidine (PEPCID) 40 MG tablet Take 40 mg by mouth daily.        fluticasone-salmeterol (ADVAIR) 250-50 MCG/ACT AEPB Inhale 1 puff into the lungs every 12 (twelve) hours. 60 each 6   furosemide (LASIX) 20 MG tablet Take 1 tablet (20 mg total) by mouth daily as needed (for SOB or weight gain of 3 lbs overnight). 90 tablet 3   gabapentin (NEURONTIN) 300 MG capsule Take 1 capsule by mouth at bedtime.       glipiZIDE (GLUCOTROL) 5 MG tablet Take 1 tablet (5 mg total) by mouth 2 (two) times daily before a meal. 180 tablet 1   glucose blood (GE100 BLOOD GLUCOSE TEST) test strip Use as instructed 100 each 12   isosorbide mononitrate (IMDUR) 30 MG 24 hr tablet Take 0.5 tablets (15 mg total) by mouth daily. 30 tablet 1   ivabradine (CORLANOR) 7.5 MG TABS tablet Take 2 tablets (15 mg) by mouth 2 hours prior to your Cardiac CT x 1 dose 2 tablet 0   JARDIANCE 25 MG TABS tablet Take 25 mg by mouth daily.       Lancets (ONETOUCH ULTRASOFT) lancets Check sugar twice daily 100 each 12   metFORMIN (GLUCOPHAGE) 1000 MG tablet Take 1 tablet (1,000 mg total) by mouth 2 (two) times daily with a meal. 180 tablet 1   metoprolol tartrate (LOPRESSOR) 100 MG tablet Take 1 tablet (100 mg total) by mouth 2 (two)  times daily. 60 tablet 2   traZODone (DESYREL) 50 MG tablet Take 50-100 mg by mouth at bedtime.        No current facility-administered medications for this visit.             Family History  Problem Relation Age of Onset   Diabetes Mother     Asthma Mother     Heart disease Father     Heart attack Father     Sjogren's syndrome Sister     Heart disease Brother 57        CAD/CABG            Physical Exam:           Diagnostic Studies & Laboratory data: I have personally reviewed the following studies and agree with the findings   CATH (08/2022) Coronary Findings   Diagnostic Dominance: Co-dominant Left Main  Vessel is angiographically normal.    Left Anterior Descending  Non-stenotic Ost LAD lesion was previously treated.  Mid LAD lesion is 20% stenosed.  Dist LAD lesion is 60% stenosed.    Second Diagonal Branch  There is mild disease in the vessel.    Ramus Intermedius  The vessel exhibits minimal luminal irregularities.    Left Circumflex  The vessel exhibits minimal luminal irregularities.    Right Coronary Artery  Vessel is small.  Prox RCA lesion is 50% stenosed.      TEE (08/2022) IMPRESSIONS     1. Left ventricular ejection fraction, by estimation, is 60 to 65%. The  left ventricle has normal function. The left ventricle has no regional  wall motion abnormalities. There is mild concentric LVH with moderate  asymmetric left ventricular hypertrophy  of the basal-septal segment.   2. Right ventricular systolic function is normal. The right ventricular  size is normal.   3. The mitral valve is normal in structure. No evidence of mitral valve  regurgitation. No evidence of mitral stenosis.   4. The aortic valve is tricuspid. Select images suggesting mild valve  prolapse LCC. There is mild calcification of the aortic valve. Aortic  valve regurgitation is moderate. Visually with mild aortic valve stenosis.  No subvalvular membrane noted.   5. The  inferior vena cava is normal in size with greater than 50%  respiratory variability, suggesting right atrial pressure of 3 mmHg.   6. No left atrial/left atrial appendage thrombus was detected.   7. Agitated saline contrast bubble study was negative, with no evidence  of any interatrial shunt.   Conclusion(s)/Recommendation(s): Normal biventricular function without  evidence of hemodynamically significant valvular heart disease.   FINDINGS   Left Ventricle: Left ventricular ejection fraction, by estimation, is 60  to 65%. The left ventricle has normal function. The left ventricle has no  regional wall motion abnormalities. The left ventricular internal cavity  size was normal in size. There is   moderate asymmetric left ventricular hypertrophy of the basal-septal  segment.   Right Ventricle: The right ventricular size is normal. No increase in  right ventricular wall thickness. Right ventricular systolic function is  normal.   Left Atrium: Left atrial size was normal in size. No left atrial/left  atrial appendage thrombus was detected.   Right Atrium: Right atrial size was normal in size.   Pericardium: There is no evidence of pericardial effusion.   Mitral Valve: The mitral valve is normal in structure. No evidence of  mitral valve regurgitation. No evidence of mitral valve stenosis.   Tricuspid Valve: The tricuspid valve is normal in structure. Tricuspid  valve regurgitation is not demonstrated. No evidence of tricuspid  stenosis.   Aortic Valve: The aortic valve is tricuspid. There is mild calcification  of the aortic valve. Aortic valve regurgitation is moderate. Mild aortic  stenosis is present.   Pulmonic Valve: The pulmonic valve was normal in structure. Pulmonic valve  regurgitation is not visualized. No evidence of pulmonic stenosis.   Aorta: The aortic root is normal in size and structure.   Venous: The inferior vena cava is normal in size with greater than 50%   respiratory variability, suggesting right atrial pressure of 3 mmHg.   IAS/Shunts: No atrial level shunt detected by color flow Doppler. Agitated  saline contrast was given intravenously to evaluate for intracardiac  shunting. Agitated saline contrast bubble study was negative, with no  evidence of any interatrial shunt.     Recent Radiology Findings:   CTA (09/2022) FINDINGS: Aortic Valve: AV is bicuspid with partial raphe between the right and left cusps. The valve domes in systole and is markedly thickened with only the non coronary cusp being calcified Valve area by planimetry is 1.7 Cm2 at leaflet tips in 30% systolic  phase There is a fairly large area of central non coaptation explaining patients aortic insufficiency as well the LVOT is large with no SAM, no sub aortic web and no outflow tract obstruction   Aorta: No aneurysm, normal arch vessels No coarctation and no significant atherosclerosis   Sino-tubular Junction: 24.5 mm   Ascending Thoracic Aorta: 30 mm   Aortic Arch: 22 mm   Descending Thoracic Aorta: 22 mm   Sinus of Valsalva Measurements:   Non-coronary: 28.9 mm   Right - coronary: 27.3 mm  Height 20 mm   Left -   coronary: 28.2 mm  Height 20.4 mm   Coronary Artery Height above Annulus:   Left Main: 16.5 mm above annulus   Right Coronary: 17.2 mm above annulus   Virtual Basal Annulus Measurements:   Maximum / Minimum Diameter: 23.9 mm x 20.5 mm Average diameter 22.6 mm   Perimeter: 72.5 mm   Area: 400 mm2   Coronary Arteries: Sufficient height above annulus for deployment Patent proximal LAD stent   Optimum Fluoroscopic Angle for Delivery: LAO 13 Cranial 10 degrees   IMPRESSION: 1 coronary calcium score not performed due to prior LAD stent Left dominant cors with patent LAD stent   2.Aortic valve calcium score 447 Valve appears bicuspid with calcium only in non coronary cusp and partial raphe between right and left cusps. Marked  thickening of leaflet tips with systolic doming   3.  Normal ascending thoracic aorta with no coarctation   4. Optimum angiographic angle for deployment LAO 13 Cranial 10 degrees   5. Valve only sizes to a 23 mm Sapien 3 Medtronic Evolut sizes to 26 mm valve but not ideal given young age and LAD stent Despite patients size may be better served by surgical consultation. Calcium score very low and would need industry to review with embedded geometry as valve anchoring may be more above annulus       Recent Lab Findings: Recent Labs       Lab Results  Component Value Date    WBC 7.9 09/11/2022    HGB 11.7 (L) 09/11/2022    HCT 35.9 (L) 09/11/2022    PLT 192 09/11/2022    GLUCOSE 251 (H) 09/11/2022    CHOL 109 09/08/2022    TRIG 138 09/08/2022    HDL 31 (L) 09/08/2022    LDLCALC 50 09/08/2022    ALT 18 09/07/2022    AST 21 09/07/2022    NA 135 09/11/2022    K 3.9 09/11/2022    CL 106 09/11/2022    CREATININE 1.13 09/11/2022    BUN 15 09/11/2022    CO2 23 09/11/2022    TSH 2.82 02/08/2016    INR 1.1 09/07/2022    HGBA1C 11.7 (H) 09/07/2022            Assessment / Plan:     Pt is a 57 yo male with CAD, obesity, DM, OSA, HTN and with ongoing symptoms of what sounds like angina and concern is that the bicuspid aortic valve is most difficult to assertain its actual gradient and the component of hypertophic obstruction unclear. On discussion with Dr Kirke Corin, we will further assess his entire outflow area with precision catheter pressure catheter to determine actual pathology to be addressed with surgery.  Pt and wife understand the issues and I had reviewed the TEE with them and are in agreement to proceed with further testing.  All the risks, goals and recovery were discussed with them and also  valve options of mechanical vs tissue valve and after further cath will decide prosthesis at that time.

## 2022-10-29 NOTE — Progress Notes (Signed)
Darius Bump, RN and Dr. Leafy Ro notified of pt's abnormal UA results. No new orders. Proceed as planned

## 2022-10-30 ENCOUNTER — Inpatient Hospital Stay (HOSPITAL_COMMUNITY): Payer: BLUE CROSS/BLUE SHIELD

## 2022-10-30 ENCOUNTER — Encounter (HOSPITAL_COMMUNITY)
Admission: RE | Disposition: A | Discharge status: Home / Self Care | Payer: Self-pay | Source: Home / Self Care | Attending: Thoracic Surgery (Cardiothoracic Vascular Surgery)

## 2022-10-30 ENCOUNTER — Encounter (HOSPITAL_COMMUNITY): Payer: Self-pay | Admitting: Thoracic Surgery (Cardiothoracic Vascular Surgery)

## 2022-10-30 ENCOUNTER — Inpatient Hospital Stay (HOSPITAL_COMMUNITY)
Admission: RE | Admit: 2022-10-30 | Discharge: 2022-11-05 | DRG: 219 | Disposition: A | Discharge status: Home Health | Payer: BLUE CROSS/BLUE SHIELD | Attending: Thoracic Surgery (Cardiothoracic Vascular Surgery) | Admitting: Thoracic Surgery (Cardiothoracic Vascular Surgery)

## 2022-10-30 ENCOUNTER — Inpatient Hospital Stay (HOSPITAL_COMMUNITY): Payer: BLUE CROSS/BLUE SHIELD | Admitting: Anesthesiology

## 2022-10-30 ENCOUNTER — Other Ambulatory Visit: Payer: Self-pay

## 2022-10-30 ENCOUNTER — Inpatient Hospital Stay (HOSPITAL_COMMUNITY): Payer: BLUE CROSS/BLUE SHIELD | Admitting: Vascular Surgery

## 2022-10-30 DIAGNOSIS — E1165 Type 2 diabetes mellitus with hyperglycemia: Secondary | ICD-10-CM | POA: Diagnosis present

## 2022-10-30 DIAGNOSIS — Z7984 Long term (current) use of oral hypoglycemic drugs: Secondary | ICD-10-CM

## 2022-10-30 DIAGNOSIS — K5900 Constipation, unspecified: Secondary | ICD-10-CM | POA: Diagnosis not present

## 2022-10-30 DIAGNOSIS — Z91199 Patient's noncompliance with other medical treatment and regimen due to unspecified reason: Secondary | ICD-10-CM

## 2022-10-30 DIAGNOSIS — F32A Depression, unspecified: Secondary | ICD-10-CM | POA: Diagnosis present

## 2022-10-30 DIAGNOSIS — Z79899 Other long term (current) drug therapy: Secondary | ICD-10-CM | POA: Diagnosis not present

## 2022-10-30 DIAGNOSIS — Z952 Presence of prosthetic heart valve: Principal | ICD-10-CM

## 2022-10-30 DIAGNOSIS — I2510 Atherosclerotic heart disease of native coronary artery without angina pectoris: Secondary | ICD-10-CM | POA: Diagnosis present

## 2022-10-30 DIAGNOSIS — Z8249 Family history of ischemic heart disease and other diseases of the circulatory system: Secondary | ICD-10-CM

## 2022-10-30 DIAGNOSIS — I110 Hypertensive heart disease with heart failure: Secondary | ICD-10-CM | POA: Diagnosis present

## 2022-10-30 DIAGNOSIS — Z955 Presence of coronary angioplasty implant and graft: Secondary | ICD-10-CM

## 2022-10-30 DIAGNOSIS — Z833 Family history of diabetes mellitus: Secondary | ICD-10-CM

## 2022-10-30 DIAGNOSIS — D696 Thrombocytopenia, unspecified: Secondary | ICD-10-CM | POA: Diagnosis not present

## 2022-10-30 DIAGNOSIS — I272 Pulmonary hypertension, unspecified: Secondary | ICD-10-CM | POA: Diagnosis present

## 2022-10-30 DIAGNOSIS — E785 Hyperlipidemia, unspecified: Secondary | ICD-10-CM | POA: Diagnosis present

## 2022-10-30 DIAGNOSIS — Z7951 Long term (current) use of inhaled steroids: Secondary | ICD-10-CM

## 2022-10-30 DIAGNOSIS — I5032 Chronic diastolic (congestive) heart failure: Secondary | ICD-10-CM | POA: Diagnosis present

## 2022-10-30 DIAGNOSIS — I422 Other hypertrophic cardiomyopathy: Secondary | ICD-10-CM | POA: Diagnosis not present

## 2022-10-30 DIAGNOSIS — K219 Gastro-esophageal reflux disease without esophagitis: Secondary | ICD-10-CM | POA: Diagnosis present

## 2022-10-30 DIAGNOSIS — G4733 Obstructive sleep apnea (adult) (pediatric): Secondary | ICD-10-CM | POA: Diagnosis present

## 2022-10-30 DIAGNOSIS — I350 Nonrheumatic aortic (valve) stenosis: Secondary | ICD-10-CM | POA: Diagnosis present

## 2022-10-30 DIAGNOSIS — J4520 Mild intermittent asthma, uncomplicated: Secondary | ICD-10-CM | POA: Diagnosis present

## 2022-10-30 DIAGNOSIS — T884XXA Failed or difficult intubation, initial encounter: Secondary | ICD-10-CM

## 2022-10-30 DIAGNOSIS — Z1152 Encounter for screening for COVID-19: Secondary | ICD-10-CM

## 2022-10-30 DIAGNOSIS — E871 Hypo-osmolality and hyponatremia: Secondary | ICD-10-CM | POA: Diagnosis present

## 2022-10-30 DIAGNOSIS — Z6841 Body Mass Index (BMI) 40.0 and over, adult: Secondary | ICD-10-CM | POA: Diagnosis not present

## 2022-10-30 DIAGNOSIS — K7581 Nonalcoholic steatohepatitis (NASH): Secondary | ICD-10-CM | POA: Diagnosis present

## 2022-10-30 DIAGNOSIS — Z978 Presence of other specified devices: Secondary | ICD-10-CM

## 2022-10-30 DIAGNOSIS — I428 Other cardiomyopathies: Secondary | ICD-10-CM | POA: Diagnosis present

## 2022-10-30 DIAGNOSIS — Z7982 Long term (current) use of aspirin: Secondary | ICD-10-CM

## 2022-10-30 DIAGNOSIS — E11649 Type 2 diabetes mellitus with hypoglycemia without coma: Secondary | ICD-10-CM | POA: Diagnosis not present

## 2022-10-30 DIAGNOSIS — Q244 Congenital subaortic stenosis: Secondary | ICD-10-CM | POA: Diagnosis not present

## 2022-10-30 HISTORY — PX: AORTIC VALVE REPLACEMENT: SHX41

## 2022-10-30 HISTORY — PX: AORTIC ROOT ENLARGEMENT: SHX6346

## 2022-10-30 HISTORY — PX: TEE WITHOUT CARDIOVERSION: SHX5443

## 2022-10-30 LAB — CBC
HCT: 27 % — ABNORMAL LOW (ref 39.0–52.0)
HCT: 25.7 % — ABNORMAL LOW (ref 39.0–52.0)
Hemoglobin: 9.2 g/dL — ABNORMAL LOW (ref 13.0–17.0)
Hemoglobin: 8.4 g/dL — ABNORMAL LOW (ref 13.0–17.0)
MCH: 29.6 pg (ref 26.0–34.0)
MCH: 28 pg (ref 26.0–34.0)
MCHC: 34.1 g/dL (ref 30.0–36.0)
MCHC: 32.7 g/dL (ref 30.0–36.0)
MCV: 86.8 fL (ref 80.0–100.0)
MCV: 85.7 fL (ref 80.0–100.0)
Platelets: 119 10*3/uL — ABNORMAL LOW (ref 150–400)
Platelets: 100 10*3/uL — ABNORMAL LOW (ref 150–400)
RBC: 3.11 MIL/uL — ABNORMAL LOW (ref 4.22–5.81)
RBC: 3 MIL/uL — ABNORMAL LOW (ref 4.22–5.81)
RDW: 14.4 % (ref 11.5–15.5)
RDW: 14.6 % (ref 11.5–15.5)
WBC: 20.6 10*3/uL — ABNORMAL HIGH (ref 4.0–10.5)
WBC: 11.4 10*3/uL — ABNORMAL HIGH (ref 4.0–10.5)
nRBC: 0 % (ref 0.0–0.2)
nRBC: 0 % (ref 0.0–0.2)

## 2022-10-30 LAB — POCT I-STAT, CHEM 8
BUN: 11 mg/dL (ref 6–20)
BUN: 10 mg/dL (ref 6–20)
BUN: 11 mg/dL (ref 6–20)
BUN: 11 mg/dL (ref 6–20)
BUN: 10 mg/dL (ref 6–20)
Calcium, Ion: 1.2 mmol/L (ref 1.15–1.40)
Calcium, Ion: 1 mmol/L — ABNORMAL LOW (ref 1.15–1.40)
Calcium, Ion: 1.18 mmol/L (ref 1.15–1.40)
Calcium, Ion: 1.04 mmol/L — ABNORMAL LOW (ref 1.15–1.40)
Calcium, Ion: 0.99 mmol/L — ABNORMAL LOW (ref 1.15–1.40)
Chloride: 103 mmol/L (ref 98–111)
Chloride: 102 mmol/L (ref 98–111)
Chloride: 103 mmol/L (ref 98–111)
Chloride: 101 mmol/L (ref 98–111)
Chloride: 104 mmol/L (ref 98–111)
Creatinine, Ser: 1 mg/dL (ref 0.61–1.24)
Creatinine, Ser: 0.8 mg/dL (ref 0.61–1.24)
Creatinine, Ser: 0.9 mg/dL (ref 0.61–1.24)
Creatinine, Ser: 0.9 mg/dL (ref 0.61–1.24)
Creatinine, Ser: 0.8 mg/dL (ref 0.61–1.24)
Glucose, Bld: 195 mg/dL — ABNORMAL HIGH (ref 70–99)
Glucose, Bld: 131 mg/dL — ABNORMAL HIGH (ref 70–99)
Glucose, Bld: 195 mg/dL — ABNORMAL HIGH (ref 70–99)
Glucose, Bld: 132 mg/dL — ABNORMAL HIGH (ref 70–99)
Glucose, Bld: 167 mg/dL — ABNORMAL HIGH (ref 70–99)
HCT: 34 % — ABNORMAL LOW (ref 39.0–52.0)
HCT: 25 % — ABNORMAL LOW (ref 39.0–52.0)
HCT: 32 % — ABNORMAL LOW (ref 39.0–52.0)
HCT: 25 % — ABNORMAL LOW (ref 39.0–52.0)
HCT: 29 % — ABNORMAL LOW (ref 39.0–52.0)
Hemoglobin: 11.6 g/dL — ABNORMAL LOW (ref 13.0–17.0)
Hemoglobin: 10.9 g/dL — ABNORMAL LOW (ref 13.0–17.0)
Hemoglobin: 8.5 g/dL — ABNORMAL LOW (ref 13.0–17.0)
Hemoglobin: 8.5 g/dL — ABNORMAL LOW (ref 13.0–17.0)
Hemoglobin: 9.9 g/dL — ABNORMAL LOW (ref 13.0–17.0)
Potassium: 3.9 mmol/L (ref 3.5–5.1)
Potassium: 4 mmol/L (ref 3.5–5.1)
Potassium: 4 mmol/L (ref 3.5–5.1)
Potassium: 4.3 mmol/L (ref 3.5–5.1)
Potassium: 4.2 mmol/L (ref 3.5–5.1)
Sodium: 140 mmol/L (ref 135–145)
Sodium: 138 mmol/L (ref 135–145)
Sodium: 139 mmol/L (ref 135–145)
Sodium: 139 mmol/L (ref 135–145)
Sodium: 139 mmol/L (ref 135–145)
TCO2: 26 mmol/L (ref 22–32)
TCO2: 23 mmol/L (ref 22–32)
TCO2: 26 mmol/L (ref 22–32)
TCO2: 26 mmol/L (ref 22–32)
TCO2: 22 mmol/L (ref 22–32)

## 2022-10-30 LAB — POCT I-STAT 7, (LYTES, BLD GAS, ICA,H+H)
Acid-base deficit: 2 mmol/L (ref 0.0–2.0)
Acid-base deficit: 3 mmol/L — ABNORMAL HIGH (ref 0.0–2.0)
Acid-base deficit: 3 mmol/L — ABNORMAL HIGH (ref 0.0–2.0)
Acid-base deficit: 4 mmol/L — ABNORMAL HIGH (ref 0.0–2.0)
Acid-base deficit: 6 mmol/L — ABNORMAL HIGH (ref 0.0–2.0)
Bicarbonate: 21.9 mmol/L (ref 20.0–28.0)
Bicarbonate: 21 mmol/L (ref 20.0–28.0)
Bicarbonate: 21.9 mmol/L (ref 20.0–28.0)
Bicarbonate: 21.9 mmol/L (ref 20.0–28.0)
Bicarbonate: 19.9 mmol/L — ABNORMAL LOW (ref 20.0–28.0)
Calcium, Ion: 0.94 mmol/L — ABNORMAL LOW (ref 1.15–1.40)
Calcium, Ion: 0.99 mmol/L — ABNORMAL LOW (ref 1.15–1.40)
Calcium, Ion: 1.14 mmol/L — ABNORMAL LOW (ref 1.15–1.40)
Calcium, Ion: 1.15 mmol/L (ref 1.15–1.40)
Calcium, Ion: 1.12 mmol/L — ABNORMAL LOW (ref 1.15–1.40)
HCT: 26 % — ABNORMAL LOW (ref 39.0–52.0)
HCT: 26 % — ABNORMAL LOW (ref 39.0–52.0)
HCT: 27 % — ABNORMAL LOW (ref 39.0–52.0)
HCT: 24 % — ABNORMAL LOW (ref 39.0–52.0)
HCT: 23 % — ABNORMAL LOW (ref 39.0–52.0)
Hemoglobin: 8.8 g/dL — ABNORMAL LOW (ref 13.0–17.0)
Hemoglobin: 8.8 g/dL — ABNORMAL LOW (ref 13.0–17.0)
Hemoglobin: 9.2 g/dL — ABNORMAL LOW (ref 13.0–17.0)
Hemoglobin: 8.2 g/dL — ABNORMAL LOW (ref 13.0–17.0)
Hemoglobin: 7.8 g/dL — ABNORMAL LOW (ref 13.0–17.0)
O2 Saturation: 100 %
O2 Saturation: 99 %
O2 Saturation: 99 %
O2 Saturation: 99 %
O2 Saturation: 98 %
Patient temperature: 36.2
Patient temperature: 36.4
Patient temperature: 36.5
Potassium: 4 mmol/L (ref 3.5–5.1)
Potassium: 4.2 mmol/L (ref 3.5–5.1)
Potassium: 4.4 mmol/L (ref 3.5–5.1)
Potassium: 4.9 mmol/L (ref 3.5–5.1)
Potassium: 4.5 mmol/L (ref 3.5–5.1)
Sodium: 140 mmol/L (ref 135–145)
Sodium: 140 mmol/L (ref 135–145)
Sodium: 140 mmol/L (ref 135–145)
Sodium: 140 mmol/L (ref 135–145)
Sodium: 141 mmol/L (ref 135–145)
TCO2: 23 mmol/L (ref 22–32)
TCO2: 22 mmol/L (ref 22–32)
TCO2: 23 mmol/L (ref 22–32)
TCO2: 23 mmol/L (ref 22–32)
TCO2: 21 mmol/L — ABNORMAL LOW (ref 22–32)
pCO2 arterial: 33.1 mmHg (ref 32–48)
pCO2 arterial: 31.2 mmHg — ABNORMAL LOW (ref 32–48)
pCO2 arterial: 38.5 mmHg (ref 32–48)
pCO2 arterial: 42.6 mmHg (ref 32–48)
pCO2 arterial: 37.3 mmHg (ref 32–48)
pH, Arterial: 7.428 (ref 7.35–7.45)
pH, Arterial: 7.436 (ref 7.35–7.45)
pH, Arterial: 7.359 (ref 7.35–7.45)
pH, Arterial: 7.316 — ABNORMAL LOW (ref 7.35–7.45)
pH, Arterial: 7.332 — ABNORMAL LOW (ref 7.35–7.45)
pO2, Arterial: 369 mmHg — ABNORMAL HIGH (ref 83–108)
pO2, Arterial: 122 mmHg — ABNORMAL HIGH (ref 83–108)
pO2, Arterial: 131 mmHg — ABNORMAL HIGH (ref 83–108)
pO2, Arterial: 127 mmHg — ABNORMAL HIGH (ref 83–108)
pO2, Arterial: 108 mmHg (ref 83–108)

## 2022-10-30 LAB — POCT I-STAT EG7
Acid-base deficit: 2 mmol/L (ref 0.0–2.0)
Bicarbonate: 23.1 mmol/L (ref 20.0–28.0)
Calcium, Ion: 1.03 mmol/L — ABNORMAL LOW (ref 1.15–1.40)
HCT: 26 % — ABNORMAL LOW (ref 39.0–52.0)
Hemoglobin: 8.8 g/dL — ABNORMAL LOW (ref 13.0–17.0)
O2 Saturation: 67 %
Potassium: 3.5 mmol/L (ref 3.5–5.1)
Sodium: 140 mmol/L (ref 135–145)
TCO2: 24 mmol/L (ref 22–32)
pCO2, Ven: 38.1 mmHg — ABNORMAL LOW (ref 44–60)
pH, Ven: 7.39 (ref 7.25–7.43)
pO2, Ven: 35 mmHg (ref 32–45)

## 2022-10-30 LAB — PREPARE FRESH FROZEN PLASMA

## 2022-10-30 LAB — GLUCOSE, CAPILLARY
Glucose-Capillary: 199 mg/dL — ABNORMAL HIGH (ref 70–99)
Glucose-Capillary: 116 mg/dL — ABNORMAL HIGH (ref 70–99)
Glucose-Capillary: 130 mg/dL — ABNORMAL HIGH (ref 70–99)
Glucose-Capillary: 132 mg/dL — ABNORMAL HIGH (ref 70–99)
Glucose-Capillary: 127 mg/dL — ABNORMAL HIGH (ref 70–99)
Glucose-Capillary: 120 mg/dL — ABNORMAL HIGH (ref 70–99)
Glucose-Capillary: 103 mg/dL — ABNORMAL HIGH (ref 70–99)
Glucose-Capillary: 104 mg/dL — ABNORMAL HIGH (ref 70–99)
Glucose-Capillary: 132 mg/dL — ABNORMAL HIGH (ref 70–99)
Glucose-Capillary: 126 mg/dL — ABNORMAL HIGH (ref 70–99)
Glucose-Capillary: 123 mg/dL — ABNORMAL HIGH (ref 70–99)

## 2022-10-30 LAB — BASIC METABOLIC PANEL
Anion gap: 5 (ref 5–15)
BUN: 9 mg/dL (ref 6–20)
CO2: 22 mmol/L (ref 22–32)
Calcium: 7.5 mg/dL — ABNORMAL LOW (ref 8.9–10.3)
Chloride: 110 mmol/L (ref 98–111)
Creatinine, Ser: 0.9 mg/dL (ref 0.61–1.24)
GFR, Estimated: >60 mL/min (ref >60)
Glucose, Bld: 109 mg/dL — ABNORMAL HIGH (ref 70–99)
Potassium: 4.8 mmol/L (ref 3.5–5.1)
Sodium: 137 mmol/L (ref 135–145)

## 2022-10-30 LAB — PROTIME-INR
INR: 1.6 — ABNORMAL HIGH (ref 0.8–1.2)
Prothrombin Time: 19 seconds — ABNORMAL HIGH (ref 11.4–15.2)

## 2022-10-30 LAB — TYPE AND SCREEN

## 2022-10-30 LAB — PREPARE RBC (CROSSMATCH)

## 2022-10-30 LAB — BPAM FFP
Blood Product Expiration Date: 202406192359
Unit Type and Rh: 600

## 2022-10-30 LAB — MAGNESIUM: Magnesium: 2.9 mg/dL — ABNORMAL HIGH (ref 1.7–2.4)

## 2022-10-30 LAB — APTT: aPTT: 33 seconds (ref 24–36)

## 2022-10-30 LAB — PLATELET COUNT: Platelets: 149 10*3/uL — ABNORMAL LOW (ref 150–400)

## 2022-10-30 LAB — HEMOGLOBIN AND HEMATOCRIT, BLOOD
HCT: 24.2 % — ABNORMAL LOW (ref 39.0–52.0)
Hemoglobin: 8.1 g/dL — ABNORMAL LOW (ref 13.0–17.0)

## 2022-10-30 LAB — BPAM RBC: ISSUE DATE / TIME: 202406141142

## 2022-10-30 LAB — ECHO INTRAOPERATIVE TEE

## 2022-10-30 SURGERY — REPLACEMENT, AORTIC VALVE, OPEN
Anesthesia: General | Site: Chest

## 2022-10-30 MED ORDER — CEFAZOLIN SODIUM 1 G IJ SOLR
INTRAMUSCULAR | Status: AC
  Filled 2022-10-30: qty 10

## 2022-10-30 MED ORDER — HEPARIN SODIUM (PORCINE) 1000 UNIT/ML IJ SOLN
INTRAMUSCULAR | Status: AC
  Filled 2022-10-30: qty 1

## 2022-10-30 MED ORDER — INSULIN ASPART 100 UNIT/ML IJ SOLN
0.0000 [IU] | INTRAMUSCULAR | Status: DC | PRN
  Administered 2022-10-30: 4 [IU] via SUBCUTANEOUS

## 2022-10-30 MED ORDER — TRANEXAMIC ACID 1000 MG/10ML IV SOLN
1.5000 mg/kg/h | INTRAVENOUS | Status: DC
  Filled 2022-10-30: qty 25

## 2022-10-30 MED ORDER — SODIUM CHLORIDE 0.9% FLUSH: 3.0000 mL | INTRAVENOUS | Status: DC | PRN

## 2022-10-30 MED ORDER — ACETAMINOPHEN 160 MG/5ML PO SOLN: 1000.0000 mg | Freq: Four times a day (QID) | ORAL | Status: DC

## 2022-10-30 MED ORDER — VANCOMYCIN HCL IN DEXTROSE 1-5 GM/200ML-% IV SOLN
1000.0000 mg | Freq: Once | INTRAVENOUS | Status: AC
  Administered 2022-10-30: 1000 mg via INTRAVENOUS
  Filled 2022-10-30: qty 200

## 2022-10-30 MED ORDER — FLUTICASONE FUROATE-VILANTEROL 200-25 MCG/ACT IN AEPB
1.0000 | INHALATION_SPRAY | Freq: Every day | RESPIRATORY_TRACT | Status: DC
  Administered 2022-10-31 – 2022-11-05 (×6): 1 via RESPIRATORY_TRACT
  Filled 2022-10-30: qty 28

## 2022-10-30 MED ORDER — FENTANYL CITRATE (PF) 250 MCG/5ML IJ SOLN
INTRAMUSCULAR | Status: AC
  Filled 2022-10-30: qty 5

## 2022-10-30 MED ORDER — LACTATED RINGERS IV SOLN: INTRAVENOUS | Status: DC | PRN

## 2022-10-30 MED ORDER — METOPROLOL TARTRATE 12.5 MG HALF TABLET
12.5000 mg | ORAL_TABLET | Freq: Two times a day (BID) | ORAL | Status: DC
  Administered 2022-10-31 (×2): 12.5 mg via ORAL
  Filled 2022-10-30 (×3): qty 1

## 2022-10-30 MED ORDER — CHLORHEXIDINE GLUCONATE CLOTH 2 % EX PADS
6.0000 | MEDICATED_PAD | Freq: Every day | CUTANEOUS | Status: DC
  Administered 2022-10-31 – 2022-11-02 (×3): 6 via TOPICAL

## 2022-10-30 MED ORDER — PROTAMINE SULFATE 10 MG/ML IV SOLN
INTRAVENOUS | Status: DC | PRN
  Administered 2022-10-30: 400 mg via INTRAVENOUS

## 2022-10-30 MED ORDER — VANCOMYCIN HCL 1000 MG IV SOLR: INTRAVENOUS | Status: DC | PRN

## 2022-10-30 MED ORDER — GABAPENTIN 300 MG PO CAPS: 300.0000 mg | ORAL_CAPSULE | Freq: Every day | ORAL | Status: DC

## 2022-10-30 MED ORDER — LACTATED RINGERS IV SOLN: INTRAVENOUS | Status: DC

## 2022-10-30 MED ORDER — CHLORHEXIDINE GLUCONATE 0.12 % MT SOLN
15.0000 mL | OROMUCOSAL | Status: AC
  Administered 2022-10-30: 15 mL via OROMUCOSAL
  Filled 2022-10-30: qty 15

## 2022-10-30 MED ORDER — MIDAZOLAM HCL 5 MG/5ML IJ SOLN
INTRAMUSCULAR | Status: DC | PRN
  Administered 2022-10-30 (×6): 2 mg via INTRAVENOUS

## 2022-10-30 MED ORDER — OXYCODONE HCL 5 MG PO TABS
5.0000 mg | ORAL_TABLET | ORAL | Status: DC | PRN
  Administered 2022-10-31: 5 mg via ORAL
  Administered 2022-10-31 (×2): 10 mg via ORAL
  Administered 2022-10-31 – 2022-11-01 (×4): 5 mg via ORAL
  Administered 2022-11-01 – 2022-11-05 (×7): 10 mg via ORAL
  Filled 2022-10-30 (×2): qty 2
  Filled 2022-10-30 (×3): qty 1
  Filled 2022-10-30: qty 2
  Filled 2022-10-30: qty 1
  Filled 2022-10-30: qty 2
  Filled 2022-10-30: qty 1
  Filled 2022-10-30 (×6): qty 2

## 2022-10-30 MED ORDER — FAMOTIDINE 20 MG PO TABS
40.0000 mg | ORAL_TABLET | Freq: Every day | ORAL | Status: DC
  Administered 2022-10-31 – 2022-11-04 (×5): 40 mg via ORAL
  Filled 2022-10-30 (×5): qty 2

## 2022-10-30 MED ORDER — FAMOTIDINE 20 MG PO TABS
40.0000 mg | ORAL_TABLET | Freq: Every day | ORAL | Status: DC
  Administered 2022-10-30: 40 mg
  Filled 2022-10-30: qty 2

## 2022-10-30 MED ORDER — LIDOCAINE 2% (20 MG/ML) 5 ML SYRINGE
INTRAMUSCULAR | Status: AC
  Filled 2022-10-30: qty 5

## 2022-10-30 MED ORDER — PHENYLEPHRINE 80 MCG/ML (10ML) SYRINGE FOR IV PUSH (FOR BLOOD PRESSURE SUPPORT)
PREFILLED_SYRINGE | INTRAVENOUS | Status: DC | PRN
  Administered 2022-10-30 (×8): 80 ug via INTRAVENOUS

## 2022-10-30 MED ORDER — SODIUM CHLORIDE 0.9% FLUSH
3.0000 mL | Freq: Two times a day (BID) | INTRAVENOUS | Status: DC
  Administered 2022-10-31 – 2022-11-01 (×3): 3 mL via INTRAVENOUS

## 2022-10-30 MED ORDER — MIDAZOLAM HCL 2 MG/2ML IJ SOLN: 2.0000 mg | INTRAMUSCULAR | Status: DC | PRN

## 2022-10-30 MED ORDER — DOCUSATE SODIUM 100 MG PO CAPS: 200.0000 mg | ORAL_CAPSULE | Freq: Every day | ORAL | Status: DC

## 2022-10-30 MED ORDER — SODIUM CHLORIDE 0.9 % IV SOLN: INTRAVENOUS | Status: DC

## 2022-10-30 MED ORDER — ALBUTEROL SULFATE HFA 108 (90 BASE) MCG/ACT IN AERS: 1.0000 | INHALATION_SPRAY | Freq: Four times a day (QID) | RESPIRATORY_TRACT | Status: DC | PRN

## 2022-10-30 MED ORDER — CALCIUM CHLORIDE 10 % IV SOLN
INTRAVENOUS | Status: DC | PRN
  Administered 2022-10-30 (×3): 100 mg via INTRAVENOUS

## 2022-10-30 MED ORDER — SODIUM CHLORIDE 0.9 % IV SOLN
10.0000 mL/h | Freq: Once | INTRAVENOUS | Status: AC
  Administered 2022-10-30: 10 mL/h via INTRAVENOUS

## 2022-10-30 MED ORDER — GABAPENTIN 300 MG PO CAPS
300.0000 mg | ORAL_CAPSULE | Freq: Every day | ORAL | Status: DC
  Administered 2022-10-31 – 2022-11-04 (×6): 300 mg via ORAL
  Filled 2022-10-30 (×6): qty 1

## 2022-10-30 MED ORDER — TRAMADOL HCL 50 MG PO TABS
50.0000 mg | ORAL_TABLET | ORAL | Status: DC | PRN
  Administered 2022-10-30 – 2022-10-31 (×4): 100 mg via ORAL
  Administered 2022-11-01: 50 mg via ORAL
  Administered 2022-11-01 (×2): 100 mg via ORAL
  Administered 2022-11-01: 50 mg via ORAL
  Administered 2022-11-02 – 2022-11-04 (×2): 100 mg via ORAL
  Filled 2022-10-30 (×7): qty 2
  Filled 2022-10-30 (×2): qty 1
  Filled 2022-10-30: qty 2

## 2022-10-30 MED ORDER — LIDOCAINE 2% (20 MG/ML) 5 ML SYRINGE
INTRAMUSCULAR | Status: DC | PRN
  Administered 2022-10-30: 60 mg via INTRAVENOUS

## 2022-10-30 MED ORDER — BISACODYL 10 MG RE SUPP: 10.0000 mg | Freq: Every day | RECTAL | Status: DC

## 2022-10-30 MED ORDER — DEXMEDETOMIDINE HCL IN NACL 400 MCG/100ML IV SOLN
0.0000 ug/kg/h | INTRAVENOUS | Status: DC
  Administered 2022-10-30: 0.3 ug/kg/h via INTRAVENOUS
  Filled 2022-10-30 (×2): qty 100

## 2022-10-30 MED ORDER — DOCUSATE SODIUM 100 MG PO CAPS
200.0000 mg | ORAL_CAPSULE | Freq: Every day | ORAL | Status: DC
  Administered 2022-10-31 – 2022-11-02 (×3): 200 mg via ORAL
  Filled 2022-10-30 (×3): qty 2

## 2022-10-30 MED ORDER — ATORVASTATIN CALCIUM 40 MG PO TABS
40.0000 mg | ORAL_TABLET | Freq: Every day | ORAL | Status: DC
  Administered 2022-10-30: 40 mg via ORAL
  Filled 2022-10-30: qty 1

## 2022-10-30 MED ORDER — ROCURONIUM BROMIDE 10 MG/ML (PF) SYRINGE
PREFILLED_SYRINGE | INTRAVENOUS | Status: AC
  Filled 2022-10-30: qty 10

## 2022-10-30 MED ORDER — LACTATED RINGERS IV SOLN
INTRAVENOUS | Status: DC
  Administered 2022-10-31: 20 mL/h via INTRAVENOUS

## 2022-10-30 MED ORDER — METOPROLOL TARTRATE 25 MG/10 ML ORAL SUSPENSION: 12.5000 mg | Freq: Two times a day (BID) | ORAL | Status: DC

## 2022-10-30 MED ORDER — 0.9 % SODIUM CHLORIDE (POUR BTL) OPTIME
TOPICAL | Status: DC | PRN
  Administered 2022-10-30: 5000 mL

## 2022-10-30 MED ORDER — ~~LOC~~ CARDIAC SURGERY, PATIENT & FAMILY EDUCATION
Freq: Once | Status: DC
  Filled 2022-10-30: qty 1

## 2022-10-30 MED ORDER — ROCURONIUM BROMIDE 50 MG/5ML IV SOSY
PREFILLED_SYRINGE | INTRAVENOUS | Status: DC | PRN
  Administered 2022-10-30: 100 mg via INTRAVENOUS
  Administered 2022-10-30: 50 mg via INTRAVENOUS
  Administered 2022-10-30: 100 mg via INTRAVENOUS
  Administered 2022-10-30: 50 mg via INTRAVENOUS

## 2022-10-30 MED ORDER — HEMOSTATIC AGENTS (NO CHARGE) OPTIME
TOPICAL | Status: DC | PRN
  Administered 2022-10-30 (×4): 1 via TOPICAL

## 2022-10-30 MED ORDER — MORPHINE SULFATE (PF) 2 MG/ML IV SOLN
1.0000 mg | INTRAVENOUS | Status: DC | PRN
  Administered 2022-10-30 – 2022-11-01 (×8): 2 mg via INTRAVENOUS
  Filled 2022-10-30 (×9): qty 1

## 2022-10-30 MED ORDER — CHLORHEXIDINE GLUCONATE 0.12 % MT SOLN
15.0000 mL | Freq: Once | OROMUCOSAL | Status: AC
  Administered 2022-10-30: 15 mL via OROMUCOSAL
  Filled 2022-10-30: qty 15

## 2022-10-30 MED ORDER — PROPOFOL 10 MG/ML IV BOLUS
INTRAVENOUS | Status: DC | PRN
  Administered 2022-10-30 (×2): 50 mg via INTRAVENOUS
  Administered 2022-10-30: 60 mg via INTRAVENOUS

## 2022-10-30 MED ORDER — MIDAZOLAM HCL (PF) 10 MG/2ML IJ SOLN
INTRAMUSCULAR | Status: AC
  Filled 2022-10-30: qty 2

## 2022-10-30 MED ORDER — PROPOFOL 10 MG/ML IV BOLUS
INTRAVENOUS | Status: AC
  Filled 2022-10-30: qty 20

## 2022-10-30 MED ORDER — ONDANSETRON HCL 4 MG/2ML IJ SOLN
4.0000 mg | Freq: Four times a day (QID) | INTRAMUSCULAR | Status: DC | PRN
  Administered 2022-11-01: 4 mg via INTRAVENOUS
  Filled 2022-10-30: qty 2

## 2022-10-30 MED ORDER — ATORVASTATIN CALCIUM 40 MG PO TABS
40.0000 mg | ORAL_TABLET | Freq: Every day | ORAL | Status: DC
  Administered 2022-10-31 – 2022-11-04 (×5): 40 mg via ORAL
  Filled 2022-10-30 (×5): qty 1

## 2022-10-30 MED ORDER — FAMOTIDINE 20 MG PO TABS
40.0000 mg | ORAL_TABLET | Freq: Every day | ORAL | Status: DC
  Filled 2022-10-30: qty 2

## 2022-10-30 MED ORDER — SODIUM CHLORIDE 0.9 % IV SOLN: 250.0000 mL | INTRAVENOUS | Status: DC

## 2022-10-30 MED ORDER — ASPIRIN 81 MG PO CHEW
324.0000 mg | CHEWABLE_TABLET | Freq: Every day | ORAL | Status: DC
  Filled 2022-10-30: qty 4

## 2022-10-30 MED ORDER — ALBUTEROL SULFATE (2.5 MG/3ML) 0.083% IN NEBU
2.5000 mg | INHALATION_SOLUTION | Freq: Four times a day (QID) | RESPIRATORY_TRACT | Status: DC | PRN
  Administered 2022-11-03: 2.5 mg via RESPIRATORY_TRACT
  Filled 2022-10-30: qty 3

## 2022-10-30 MED ORDER — METOCLOPRAMIDE HCL 5 MG/ML IJ SOLN
10.0000 mg | Freq: Four times a day (QID) | INTRAMUSCULAR | Status: AC
  Administered 2022-10-30 – 2022-10-31 (×5): 10 mg via INTRAVENOUS
  Filled 2022-10-30 (×6): qty 2

## 2022-10-30 MED ORDER — ALBUMIN HUMAN 5 % IV SOLN: INTRAVENOUS | Status: DC | PRN

## 2022-10-30 MED ORDER — PHENYLEPHRINE HCL-NACL 20-0.9 MG/250ML-% IV SOLN
INTRAVENOUS | Status: DC | PRN
  Administered 2022-10-30 (×2): 75 ug/min via INTRAVENOUS

## 2022-10-30 MED ORDER — FAMOTIDINE IN NACL 20-0.9 MG/50ML-% IV SOLN
20.0000 mg | Freq: Once | INTRAVENOUS | Status: AC
  Administered 2022-10-30: 20 mg via INTRAVENOUS
  Filled 2022-10-30: qty 50

## 2022-10-30 MED ORDER — POTASSIUM CHLORIDE 10 MEQ/50ML IV SOLN: 10.0000 meq | INTRAVENOUS | Status: AC

## 2022-10-30 MED ORDER — INSULIN REGULAR(HUMAN) IN NACL 100-0.9 UT/100ML-% IV SOLN: INTRAVENOUS | Status: DC

## 2022-10-30 MED ORDER — ASPIRIN 81 MG PO CHEW
324.0000 mg | CHEWABLE_TABLET | Freq: Once | ORAL | Status: AC
  Administered 2022-10-30: 324 mg via ORAL
  Filled 2022-10-30: qty 4

## 2022-10-30 MED ORDER — BISACODYL 5 MG PO TBEC
10.0000 mg | DELAYED_RELEASE_TABLET | Freq: Every day | ORAL | Status: DC
  Administered 2022-10-31 – 2022-11-02 (×3): 10 mg via ORAL
  Filled 2022-10-30 (×3): qty 2

## 2022-10-30 MED ORDER — MIDAZOLAM HCL 2 MG/2ML IJ SOLN
INTRAMUSCULAR | Status: AC
  Filled 2022-10-30: qty 2

## 2022-10-30 MED ORDER — FENTANYL CITRATE (PF) 250 MCG/5ML IJ SOLN
INTRAMUSCULAR | Status: DC | PRN
  Administered 2022-10-30: 150 ug via INTRAVENOUS
  Administered 2022-10-30: 50 ug via INTRAVENOUS
  Administered 2022-10-30: 100 ug via INTRAVENOUS
  Administered 2022-10-30: 250 ug via INTRAVENOUS
  Administered 2022-10-30: 100 ug via INTRAVENOUS
  Administered 2022-10-30 (×2): 150 ug via INTRAVENOUS
  Administered 2022-10-30: 50 ug via INTRAVENOUS
  Administered 2022-10-30: 100 ug via INTRAVENOUS
  Administered 2022-10-30: 150 ug via INTRAVENOUS

## 2022-10-30 MED ORDER — ACETAMINOPHEN 650 MG RE SUPP
650.0000 mg | Freq: Once | RECTAL | Status: AC
  Administered 2022-10-30: 650 mg via RECTAL

## 2022-10-30 MED ORDER — METOPROLOL TARTRATE 5 MG/5ML IV SOLN: 2.5000 mg | INTRAVENOUS | Status: DC | PRN

## 2022-10-30 MED ORDER — SODIUM BICARBONATE 8.4 % IV SOLN
50.0000 meq | Freq: Once | INTRAVENOUS | Status: AC
  Administered 2022-10-30: 50 meq via INTRAVENOUS

## 2022-10-30 MED ORDER — SODIUM CHLORIDE 0.45 % IV SOLN: INTRAVENOUS | Status: DC | PRN

## 2022-10-30 MED ORDER — MAGNESIUM SULFATE 4 GM/100ML IV SOLN
4.0000 g | Freq: Once | INTRAVENOUS | Status: AC
  Administered 2022-10-30: 4 g via INTRAVENOUS
  Filled 2022-10-30: qty 100

## 2022-10-30 MED ORDER — DEXTROSE 50 % IV SOLN: 0.0000 mL | INTRAVENOUS | Status: DC | PRN

## 2022-10-30 MED ORDER — DOCUSATE SODIUM 50 MG/5ML PO LIQD: 200.0000 mg | Freq: Every day | ORAL | Status: DC

## 2022-10-30 MED ORDER — TRAZODONE HCL 50 MG PO TABS: 150.0000 mg | ORAL_TABLET | Freq: Every evening | ORAL | Status: DC | PRN

## 2022-10-30 MED ORDER — PLASMA-LYTE A IV SOLN: INTRAVENOUS | Status: DC | PRN

## 2022-10-30 MED ORDER — SUCCINYLCHOLINE CHLORIDE 200 MG/10ML IV SOSY
PREFILLED_SYRINGE | INTRAVENOUS | Status: AC
  Filled 2022-10-30: qty 10

## 2022-10-30 MED ORDER — PANTOPRAZOLE SODIUM 40 MG IV SOLR
40.0000 mg | Freq: Every day | INTRAVENOUS | Status: AC
  Administered 2022-10-30 – 2022-10-31 (×2): 40 mg via INTRAVENOUS
  Filled 2022-10-30 (×2): qty 10

## 2022-10-30 MED ORDER — ALBUMIN HUMAN 5 % IV SOLN
250.0000 mL | INTRAVENOUS | Status: DC | PRN
  Administered 2022-10-30 (×3): 12.5 g via INTRAVENOUS
  Filled 2022-10-30: qty 250

## 2022-10-30 MED ORDER — GABAPENTIN 250 MG/5ML PO SOLN
300.0000 mg | Freq: Every day | ORAL | Status: DC
  Filled 2022-10-30: qty 6

## 2022-10-30 MED ORDER — ASPIRIN 325 MG PO TBEC
325.0000 mg | DELAYED_RELEASE_TABLET | Freq: Every day | ORAL | Status: DC
  Administered 2022-10-31 – 2022-11-05 (×6): 325 mg via ORAL
  Filled 2022-10-30 (×6): qty 1

## 2022-10-30 MED ORDER — ROCURONIUM BROMIDE 10 MG/ML (PF) SYRINGE
PREFILLED_SYRINGE | INTRAVENOUS | Status: AC
  Filled 2022-10-30: qty 20

## 2022-10-30 MED ORDER — ACETAMINOPHEN 500 MG PO TABS
1000.0000 mg | ORAL_TABLET | Freq: Four times a day (QID) | ORAL | Status: AC
  Administered 2022-10-31 – 2022-11-04 (×19): 1000 mg via ORAL
  Filled 2022-10-30 (×20): qty 2

## 2022-10-30 MED ORDER — PHENYLEPHRINE HCL-NACL 20-0.9 MG/250ML-% IV SOLN
0.0000 ug/min | INTRAVENOUS | Status: DC
  Administered 2022-10-30: 35 ug/min via INTRAVENOUS
  Filled 2022-10-30: qty 250

## 2022-10-30 MED ORDER — INSULIN ASPART 100 UNIT/ML IJ SOLN
INTRAMUSCULAR | Status: AC
  Filled 2022-10-30: qty 1

## 2022-10-30 MED ORDER — HEPARIN SODIUM (PORCINE) 1000 UNIT/ML IJ SOLN
INTRAMUSCULAR | Status: DC | PRN
  Administered 2022-10-30: 50000 [IU] via INTRAVENOUS

## 2022-10-30 MED ORDER — PHENYLEPHRINE 80 MCG/ML (10ML) SYRINGE FOR IV PUSH (FOR BLOOD PRESSURE SUPPORT)
PREFILLED_SYRINGE | INTRAVENOUS | Status: AC
  Filled 2022-10-30: qty 10

## 2022-10-30 MED ORDER — CHLORHEXIDINE GLUCONATE 4 % EX SOLN: 30.0000 mL | CUTANEOUS | Status: DC

## 2022-10-30 MED ORDER — PROTAMINE SULFATE 10 MG/ML IV SOLN
INTRAVENOUS | Status: AC
  Filled 2022-10-30: qty 50

## 2022-10-30 MED ORDER — METOPROLOL TARTRATE 12.5 MG HALF TABLET
12.5000 mg | ORAL_TABLET | Freq: Once | ORAL | Status: AC
  Administered 2022-10-30: 12.5 mg via ORAL
  Filled 2022-10-30: qty 1

## 2022-10-30 MED ORDER — ATORVASTATIN CALCIUM 40 MG PO TABS: 40.0000 mg | ORAL_TABLET | Freq: Every day | ORAL | Status: DC

## 2022-10-30 MED ORDER — CEFAZOLIN SODIUM-DEXTROSE 2-4 GM/100ML-% IV SOLN
2.0000 g | Freq: Three times a day (TID) | INTRAVENOUS | Status: AC
  Administered 2022-10-30 – 2022-11-01 (×5): 2 g via INTRAVENOUS
  Filled 2022-10-30 (×6): qty 100

## 2022-10-30 MED ORDER — ACETAMINOPHEN 160 MG/5ML PO SOLN: 650.0000 mg | Freq: Once | ORAL | Status: DC

## 2022-10-30 MED ORDER — PANTOPRAZOLE SODIUM 40 MG PO TBEC
40.0000 mg | DELAYED_RELEASE_TABLET | Freq: Every day | ORAL | Status: DC
  Administered 2022-11-01 – 2022-11-05 (×5): 40 mg via ORAL
  Filled 2022-10-30 (×5): qty 1

## 2022-10-30 MED ORDER — DULOXETINE HCL 60 MG PO CPEP
120.0000 mg | ORAL_CAPSULE | Freq: Every day | ORAL | Status: DC
  Administered 2022-10-30 – 2022-11-04 (×6): 120 mg via ORAL
  Filled 2022-10-30 (×6): qty 2

## 2022-10-30 SURGICAL SUPPLY — 88 items
ADAPTER CARDIO PERF ANTE/RETRO (ADAPTER) ×2 IMPLANT
ADPR CRDPLG 7.5 .25D 1 LRG Y (ADAPTER) ×2
ADPR PRFSN 84XANTGRD RTRGD (ADAPTER) ×2
ANTIFOG SOL W/FOAM PAD STRL (MISCELLANEOUS) ×2
BAG DECANTER FOR FLEXI CONT (MISCELLANEOUS) ×2 IMPLANT
BLADE CLIPPER SURG (BLADE) ×2 IMPLANT
BLADE STERNUM SYSTEM 6 (BLADE) ×2 IMPLANT
BLADE SURG 11 STRL SS (BLADE) IMPLANT
CANISTER SUCT 3000ML PPV (MISCELLANEOUS) ×2 IMPLANT
CANNULA MC2 2 STG 36/46 NON-V (CANNULA) IMPLANT
CANNULA NON VENT 20FR 12 (CANNULA) ×2 IMPLANT
CANNULA NON VENT 22FR 12 (CANNULA) IMPLANT
CATH HEART VENT LEFT (CATHETERS) ×2 IMPLANT
CATH RETROPLEGIA CORONARY 14FR (CATHETERS) ×2 IMPLANT
CATH ROBINSON RED A/P 18FR (CATHETERS) ×6 IMPLANT
CATH THOR STR 32F SOFT 20 RADI (CATHETERS) ×2 IMPLANT
CATH THORACIC 28FR RT ANG (CATHETERS) ×2 IMPLANT
CLIP TI MEDIUM 24 (CLIP) IMPLANT
CLIP TI WIDE RED SMALL 24 (CLIP) IMPLANT
CNTNR URN SCR LID CUP LEK RST (MISCELLANEOUS) IMPLANT
CONT SPEC 4OZ STRL OR WHT (MISCELLANEOUS) ×2
DEVICE SUT CK QUICK LOAD MINI (Prosthesis & Implant Heart) IMPLANT
DRAPE HALF SHEET 40X57 (DRAPES) IMPLANT
DRAPE SLUSH MACHINE 52X66 (DRAPES) IMPLANT
DRSG AQUACEL AG ADV 3.5X10 (GAUZE/BANDAGES/DRESSINGS) ×2 IMPLANT
ELECT CAUTERY BLADE 6.4 (BLADE) ×2 IMPLANT
ELECT REM PT RETURN 9FT ADLT (ELECTROSURGICAL) ×4
ELECTRODE REM PT RTRN 9FT ADLT (ELECTROSURGICAL) ×4 IMPLANT
FELT TEFLON 1X6 (MISCELLANEOUS) ×4 IMPLANT
GAUZE 4X4 16PLY ~~LOC~~+RFID DBL (SPONGE) IMPLANT
GAUZE SPONGE 4X4 12PLY STRL (GAUZE/BANDAGES/DRESSINGS) ×4 IMPLANT
GAUZE SPONGE 4X4 12PLY STRL LF (GAUZE/BANDAGES/DRESSINGS) IMPLANT
GLOVE BIOGEL PI IND STRL 6.5 (GLOVE) IMPLANT
GLOVE BIOGEL PI IND STRL 7.5 (GLOVE) IMPLANT
GLOVE SS BIOGEL STRL SZ 6 (GLOVE) IMPLANT
GOWN STRL REUS W/ TWL LRG LVL3 (GOWN DISPOSABLE) ×12 IMPLANT
GOWN STRL REUS W/ TWL XL LVL3 (GOWN DISPOSABLE) IMPLANT
GOWN STRL REUS W/TWL LRG LVL3 (GOWN DISPOSABLE) ×12
GOWN STRL REUS W/TWL XL LVL3 (GOWN DISPOSABLE) ×8
HEMASHIELD FINESSE CARDIO (Vascular Products) ×2 IMPLANT
HEMOSTAT SURGICEL 2X14 (HEMOSTASIS) IMPLANT
INSERT FOGARTY XLG (MISCELLANEOUS) ×2 IMPLANT
KIT BASIN OR (CUSTOM PROCEDURE TRAY) ×2 IMPLANT
KIT SUCTION CATH 14FR (SUCTIONS) ×2 IMPLANT
KIT SUT CK MINI COMBO 4X17 (Prosthesis & Implant Heart) IMPLANT
KIT TURNOVER KIT B (KITS) ×2 IMPLANT
LINE VENT (MISCELLANEOUS) IMPLANT
NS IRRIG 1000ML POUR BTL (IV SOLUTION) ×10 IMPLANT
ORGANIZER SUTURE GABBAY-FRATER (MISCELLANEOUS) ×2 IMPLANT
PACK E OPEN HEART (SUTURE) ×2 IMPLANT
PACK OPEN HEART (CUSTOM PROCEDURE TRAY) ×2 IMPLANT
PAD ARMBOARD 7.5X6 YLW CONV (MISCELLANEOUS) ×4 IMPLANT
PAD ELECT DEFIB RADIOL ZOLL (MISCELLANEOUS) ×2 IMPLANT
PATCH HEMASHIELD FINESS CARDIO (Vascular Products) IMPLANT
PENCIL BUTTON HOLSTER BLD 10FT (ELECTRODE) ×2 IMPLANT
POSITIONER HEAD DONUT 9IN (MISCELLANEOUS) ×2 IMPLANT
PUNCH AORTIC ROTATE 4.5MM 8IN (MISCELLANEOUS) ×2 IMPLANT
SEALANT SURG COSEAL 8ML (VASCULAR PRODUCTS) IMPLANT
SET MPS 3-ND DEL (MISCELLANEOUS) IMPLANT
SET Y-VENT ADPTR 7.5 MALE LUER (ADAPTER) IMPLANT
SOLUTION ANTFG W/FOAM PAD STRL (MISCELLANEOUS) IMPLANT
SPONGE T-LAP 18X18 ~~LOC~~+RFID (SPONGE) IMPLANT
STOPCOCK 4 WAY LG BORE MALE ST (IV SETS) IMPLANT
SUT BONE WAX W31G (SUTURE) ×2 IMPLANT
SUT EB EXC GRN/WHT 2-0 V-5 (SUTURE) ×4 IMPLANT
SUT ETHIBOND 2 0 SH (SUTURE) ×2
SUT ETHIBOND 2 0 SH 36X2 (SUTURE) IMPLANT
SUT MNCRL AB 4-0 PS2 18 (SUTURE) ×4 IMPLANT
SUT PROLENE 4 0 RB 1 (SUTURE) ×16
SUT PROLENE 4 0 SH DA (SUTURE) ×2 IMPLANT
SUT PROLENE 4-0 RB1 .5 CRCL 36 (SUTURE) IMPLANT
SUT STEEL SZ 6 DBL 3X14 BALL (SUTURE) ×4 IMPLANT
SUT VIC AB 0 CTX 36 (SUTURE) ×4
SUT VIC AB 0 CTX36XBRD ANTBCTR (SUTURE) ×4 IMPLANT
SUT VIC AB 2-0 CT1 27 (SUTURE) ×4
SUT VIC AB 2-0 CT1 TAPERPNT 27 (SUTURE) ×4 IMPLANT
SYSTEM SAHARA CHEST DRAIN ATS (WOUND CARE) ×2 IMPLANT
TAPE CLOTH SURG 4X10 WHT LF (GAUZE/BANDAGES/DRESSINGS) IMPLANT
TAPE PAPER 2X10 WHT MICROPORE (GAUZE/BANDAGES/DRESSINGS) IMPLANT
TOWEL GREEN STERILE (TOWEL DISPOSABLE) ×2 IMPLANT
TOWEL GREEN STERILE FF (TOWEL DISPOSABLE) ×2 IMPLANT
TRAY FOLEY SLVR 16FR TEMP STAT (SET/KITS/TRAYS/PACK) ×2 IMPLANT
TUBE CONNECTING 12X1/4 (SUCTIONS) IMPLANT
UNDERPAD 30X36 HEAVY ABSORB (UNDERPADS AND DIAPERS) ×2 IMPLANT
VALVE AORTIC SZ23 INSP/RESIL (Prosthesis & Implant Heart) IMPLANT
VENT LEFT HEART 12002 (CATHETERS) ×2
WATER STERILE IRR 1000ML POUR (IV SOLUTION) ×4 IMPLANT
YANKAUER SUCT BULB TIP NO VENT (SUCTIONS) IMPLANT

## 2022-10-30 NOTE — Interval H&P Note (Signed)
History and Physical Interval Note:  10/30/2022 6:33 AM  Brett Marshall  has presented today for surgery, with the diagnosis of SEVERE AS.  The various methods of treatment have been discussed with the patient and family. After consideration of risks, benefits and other options for treatment, the patient has consented to  Procedure(s): AORTIC VALVE REPLACEMENT (AVR) (N/A) TRANSESOPHAGEAL ECHOCARDIOGRAM (N/A) as a surgical intervention.  The patient's history has been reviewed, patient examined, no change in status, stable for surgery.  I have reviewed the patient's chart and labs.  Questions were answered to the patient's satisfaction.     Eugenio Hoes

## 2022-10-30 NOTE — Anesthesia Procedure Notes (Signed)
Central Venous Catheter Insertion Performed by: Marcene Duos, MD, anesthesiologist Start/End6/14/2024 6:55 AM, 10/30/2022 7:10 AM Patient location: Pre-op. Preanesthetic checklist: patient identified, IV checked, site marked, risks and benefits discussed, surgical consent, monitors and equipment checked, pre-op evaluation, timeout performed and anesthesia consent Hand hygiene performed  and maximum sterile barriers used  PA cath was placed.Swan type:thermodilution PA Cath depth:50 Procedure performed without using ultrasound guided technique. Attempts: 1 Patient tolerated the procedure well with no immediate complications.

## 2022-10-30 NOTE — Op Note (Signed)
CARDIOVASCULAR SURGERY OPERATIVE NOTE  10/30/2022 Brett Marshall [id number]  Surgeon:  Ashley Akin, MD  First Assistant: Lowella Dandy [facility]                               An experienced assistant was required given the complexity of this surgery and the standard of surgical care. The assistant was needed for exposure, dissection, suctioning, retraction of delicate tissues and sutures, instrument exchange and for overall help during this procedure.     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 On discussion with Dr Kirke Corin, we will further assess his entire outflow area with precision catheter pressure catheter to determine actual pathology to be addressed with surgery.  Pt and wife understand the issues and I had reviewed the TEE with them and are in agreement to proceed with further testing.   Findings: The aortic valve was trileaflet but very fibrotic with fusion of all three commisures. The annulus was a tight 21mm opening after valve leaflet resection requiring an aortic root enlargement procedure. At conclusion of procedure, there was a well seated valve with a 3 mmHg mean gradient and normal LV  function;   Media Information      Preparation:  The patient was seen in the preoperative holding area and the correct patient,  correct operation were confirmed with the patient after reviewing the medical record and catheterization. The consent was signed by me. Preoperative antibiotics were given. A pulmonary arterial line and radial arterial line were placed by the anesthesia team. The patient was taken back to the operating room and positioned supine on the operating room table. After being placed under general endotracheal anesthesia by the anesthesia team a foley catheter was placed. The neck, chest, abdomen, and both legs were prepped with betadine soap and solution and draped in the usual sterile manner. A surgical time-out was taken and the correct patient and operative procedure were confirmed with the nursing and anesthesia staff.  Operation: Median sternotomy incision was then created in the sternum above the sternal saw.  The patient is very obese and the mediastinal structures were encased in fat.  Pericardial well was developed and heparin was delivered. Aorta was cannulated with a 22 French aortic cannula and a two-stage cannulas placed in right atrium for venous return.  An antegrade cardioplegia catheter was placed in the ascending aorta and with adequate confirmation of anticoagulation cardiopulmonary bypass was instituted. At this time the left ventricular sump was placed across right superior pulmonary vein.  With adequate cardiopulmonary bypass aortic cross-clamp was placed and [place] blood cardioplegia was delivered antegrade for total of 1200 cc. Aortotomy  was performed and the aortic valve was identified resected and the annulus debrided.  It was evident at this point that the aortic annulus was not going to be allowed to take as a 23 mm valve and therefore aortotomy was continued down through the apex of the left and noncoronary commissure and into the aorto mitral curtain. Also it was noted there was a subaortic membrane in the outflow tract which was resected and secondary to asymmetric septal  hypertrophy a septal myectomy was performed to the right of the right coronary opening. A Hemashield Dacron graft was then fashioned and sutured into patch widen the annulus with a running 4-0 Prolene suture.  The assistant was critical then being able to provide adequate exposure through out the aortic valve replacement and patch enlargement.  Following this sixteen 2-0 Tevdek pledgeted sutures were placed the annulus with the pledgets on the ventricular side on the outside of the graft material. The 25 mm Inspiris valve was then secured with the cor knot system. The aortotomy was then closed with a running pledgeted 4-0 Prolene suture medial to lateral and the patch was fashioned to complete the aortotomy closure.  Patient then had the aortic cross-clamp removed following reanimation dose antegrade and CoSeal was applied to the suture line prior to cross-clamp removal.  Ventricular atrial pacing wires were placed and brought out inferior stab was and secured. Patient was then weaned from cardiopulmonary bypass on a small amount of Neo-Synephrine support with adequate hemodynamics protamine was delivered the patient was decannulated and sites oversewn necessary.  Chest tubes were brought inferior stab wounds and secured with adequate hemostasis the sternum was reapproximated with interrupted stainless steel wires and the presternal subcutaneous tissue and skin closed in multiple layers absorbable suture.  Sterile dressings were applied.  Cross-clamp time was 96 minutes and perfusion time was 139 minutes.

## 2022-10-30 NOTE — Transfer of Care (Signed)
Immediate Anesthesia Transfer of Care Note  Patient: Brett Marshall  Procedure(s) Performed: AORTIC VALVE REPLACEMENT (AVR) USING INSPIRIS RESILIA AORTIC VALVE (Chest) TRANSESOPHAGEAL ECHOCARDIOGRAM AORTIC ROOT ENLARGEMENT AND SUBAORTIC MYOMECTOMY (Chest)  Patient Location: PACU  Anesthesia Type:General  Level of Consciousness: sedated and Patient remains intubated per anesthesia plan  Airway & Oxygen Therapy: Patient remains intubated per anesthesia plan and Patient placed on Ventilator (see vital sign flow sheet for setting)  Post-op Assessment: Report given to RN and Post -op Vital signs reviewed and stable  Post vital signs: Reviewed and stable  Last Vitals:  Vitals Value Taken Time  BP    Temp    Pulse    Resp    SpO2      Last Pain:  Vitals:   10/30/22 0550  TempSrc:   PainSc: 4          Complications:  Encounter Notable Events  Notable Event Outcome Phase Comment  Difficult to intubate - expected  Intraprocedure Filed from anesthesia note documentation.

## 2022-10-30 NOTE — Progress Notes (Signed)
Patient ID: Brett Marshall, male   DOB: 08-29-1965, 57 y.o.   MRN: [id number]  TCTS Evening Rounds:   Hemodynamically stable  CI = 1.5 AV paced at 80  Has started to wake up on vent.  Urine output good  CT output low  CBC    Component Value Date/Time   WBC 20.6 (H) 10/30/2022 1249   RBC 3.11 (L) 10/30/2022 1249   HGB 9.2 (L) 10/30/2022 1249   HGB 9.2 (L) 10/30/2022 1249   HGB 13.4 12/08/2017 1151   HCT 27.0 (L) 10/30/2022 1249   HCT 27.0 (L) 10/30/2022 1249   HCT 40.8 12/08/2017 1151   PLT 119 (L) 10/30/2022 1249   PLT 249 12/08/2017 1151   MCV 86.8 10/30/2022 1249   MCV 86 12/08/2017 1151   MCH 29.6 10/30/2022 1249   MCHC 34.1 10/30/2022 1249   RDW 14.4 10/30/2022 1249   RDW 14.2 12/08/2017 1151   LYMPHSABS 2.1 12/08/2017 1151   MONOABS 648 02/08/2016 1158   EOSABS 0.3 12/08/2017 1151   BASOSABS 0.0 12/08/2017 1151     BMET    Component Value Date/Time   NA 140 10/30/2022 1249   NA 139 12/08/2017 1150   K 4.4 10/30/2022 1249   CL 104 10/30/2022 1129   CO2 17 (L) 10/28/2022 1113   GLUCOSE 167 (H) 10/30/2022 1129   BUN 10 10/30/2022 1129   BUN 22 12/08/2017 1150   CREATININE 0.80 10/30/2022 1129   CREATININE 0.66 (L) 02/08/2016 1158   CALCIUM 8.4 (L) 10/28/2022 1113   GFRNONAA >60 10/28/2022 1113     A/P:  Stable postop course. Continue current plans

## 2022-10-30 NOTE — Progress Notes (Signed)
   10/30/22 2312  BiPAP/CPAP/SIPAP  $ Non-Invasive Ventilator  Non-Invasive Vent Initial  $ Face Mask Large  Yes  BiPAP/CPAP/SIPAP Pt Type Adult  BiPAP/CPAP/SIPAP SERVO  Mask Type Full face mask  Mask Size Large  Set Rate 12 breaths/min  Respiratory Rate 21 breaths/min  IPAP 10 cmH20  EPAP 5 cmH2O  FiO2 (%) 40 %  Flow Rate 0.9 lpm  Minute Ventilation 9.8  Leak 70  Peak Inspiratory Pressure (PIP) 14  Tidal Volume (Vt) 546  Patient Home Equipment No  Auto Titrate No  Press High Alarm 45 cmH2O  Press Low Alarm 2 cmH2O  BiPAP/CPAP /SiPAP Vitals  Bilateral Breath Sounds Clear;Diminished

## 2022-10-30 NOTE — Anesthesia Procedure Notes (Signed)
Procedure Name: Intubation Date/Time: 10/30/2022 7:46 AM  Performed by: Adria Dill, CRNAPre-anesthesia Checklist: Patient identified, Emergency Drugs available, Suction available and Patient being monitored Patient Re-evaluated:Patient Re-evaluated prior to induction Oxygen Delivery Method: Circle system utilized Preoxygenation: Pre-oxygenation with 100% oxygen Induction Type: IV induction Ventilation: Mask ventilation without difficulty and Oral airway inserted - appropriate to patient size Laryngoscope Size: Glidescope and 3 Grade View: Grade I Tube type: Oral Tube size: 8.0 mm Number of attempts: 1 Airway Equipment and Method: Stylet and Oral airway Placement Confirmation: ETT inserted through vocal cords under direct vision, positive ETCO2 and breath sounds checked- equal and bilateral Secured at: 21 cm Tube secured with: Tape Dental Injury: Teeth and Oropharynx as per pre-operative assessment  Difficulty Due To: Difficulty was anticipated, Difficult Airway-  due to edematous airway and Difficult Airway- due to anterior larynx Future Recommendations: Recommend- induction with short-acting agent, and alternative techniques readily available Comments: Pt with small glottic opening, extensive tissue in supraglottic area

## 2022-10-30 NOTE — Anesthesia Procedure Notes (Signed)
Central Venous Catheter Insertion Performed by: Marcene Duos, MD, anesthesiologist Start/End6/14/2024 6:55 AM, 10/30/2022 7:10 AM Patient location: Pre-op. Preanesthetic checklist: patient identified, IV checked, site marked, risks and benefits discussed, surgical consent, monitors and equipment checked, pre-op evaluation, timeout performed and anesthesia consent Position: Trendelenburg Lidocaine 1% used for infiltration and patient sedated Hand hygiene performed , maximum sterile barriers used  and Seldinger technique used Catheter size: 9 Fr Total catheter length 10. Central line and PA cath was placed.MAC introducer Swan type:thermodilution PA Cath depth:50 Procedure performed using ultrasound guided technique. Ultrasound Notes:anatomy identified, needle tip was noted to be adjacent to the nerve/plexus identified, no ultrasound evidence of intravascular and/or intraneural injection and image(s) printed for medical record Attempts: 1 Following insertion, line sutured, dressing applied and Biopatch. Post procedure assessment: blood return through all ports, free fluid flow and no air  Patient tolerated the procedure well with no immediate complications.

## 2022-10-30 NOTE — Hospital Course (Addendum)
History of Present Illness:  Brett Marshall is a 36 you male with history of CAD and possible LVOT obstruction.  He had a stent to the proximal LAD in 2023.  He initially did well but returned with complaints of chest pain.  Cardiac catheterization was performed and showed a patent LAD stent.  He was started on a beta blocker.  He continues to have daily chest pain with radiation into his neck and left shoulder.  This occurs at rest and with exertion.  He also experienced shortness of breath with rest.  Echocardiogram showed non calcified mild Aortic Stenosis.  TAVR CTA showed no evidence of LVOT obstruction.  Discussion in the multidisciplinary valve meeting felt the patient's symptoms were due to his bicuspid Aortic Valve.  It was felt he should undergo surgical replacement.  He was evaluated by Dr. Leafy Ro who felt additional testing was required to determine the actual gradient across his valve.  If indicated he would require valve replacement.  The risks and benefits of the procedure were explained to the patient and he was agreeable to proceed.  Hospital Course:  Brett Marshall presented to [facility] on 10/30/2022.  She was taken to the operating room and underwent Aortic Valve Replacement with Aortic Enlargement., with a 23 mm Edwards Resilia Bioprosthetic Valve, Resection of Sub Aortic Membrane, and Septal Myomectomy.  He tolerated the procedure and was taken to the SICU in stable condition.  Postoperative hospital course:  The patient was extubated using standard post cardiac surgical protocols without difficulty.  He has maintained stable hemodynamics initially requiring Neo-Synephrine which was weaned without difficulty.  He was started on low-dose beta-blocker and on postop day 1 Swan and arterial line were discontinued.  He is noted to have a history of poorly controlled diabetes with a hemoglobin A1c of 10.0.  He was treated initially with routine insulin drip with transition to  Levemir and sliding scale.  He was on metformin preoperatively.  He is noted to have an expected postoperative volume overload and has been started on a course of diuretics.  Renal function has remained normal.  Pacing wires and Foley catheter were removed on postop day 2.  Chest tubes have also been removed.  He was also kept in the unit for assistance with ambulation and closer monitoring then can be done on the floor due to his super morbid obesity.

## 2022-10-30 NOTE — Anesthesia Postprocedure Evaluation (Signed)
Anesthesia Post Note  Patient: Brett Marshall  Procedure(s) Performed: AORTIC VALVE REPLACEMENT (AVR) USING INSPIRIS RESILIA AORTIC VALVE (Chest) TRANSESOPHAGEAL ECHOCARDIOGRAM AORTIC ROOT ENLARGEMENT AND SUBAORTIC MYOMECTOMY (Chest)     Patient location during evaluation: SICU Anesthesia Type: General Level of consciousness: sedated Pain management: pain level controlled Vital Signs Assessment: post-procedure vital signs reviewed and stable Respiratory status: patient remains intubated per anesthesia plan Cardiovascular status: stable Postop Assessment: no apparent nausea or vomiting Anesthetic complications: yes  Encounter Notable Events  Notable Event Outcome Phase Comment  Difficult to intubate - expected  Intraprocedure Filed from anesthesia note documentation.    Last Vitals:  Vitals:   10/30/22 1500 10/30/22 1515  BP: 107/83   Pulse: 80 80  Resp: 16 16  Temp: (!) 36.1 C (!) 36.1 C  SpO2: 100% 100%    Last Pain:  Vitals:   10/30/22 1400  TempSrc: Core  PainSc:                  Brett Marshall

## 2022-10-30 NOTE — Brief Op Note (Signed)
10/30/2022  9:35 AM  PATIENT:  Brett Marshall  57 y.o. male  PRE-OPERATIVE DIAGNOSIS:  SEVERE AS  POST-OPERATIVE DIAGNOSIS:  SEVERE AS  PROCEDURE:  Procedure(s) with comments:  AORTIC VALVE REPLACEMENT with AORTIC ENLARGEMENT ([place]) -23 mm Edwards Inspiris Bioprosthetic Vavle  RESECTION SUB AORTIC MEMBRANE  SEPTAL MYOMECTOMY  TRANSESOPHAGEAL ECHOCARDIOGRAM (N/A)   SURGEON:  Surgeon(s) and Role:    Eugenio Hoes, MD - Primary  PHYSICIAN ASSISTANT: Lowella Dandy PA-C  ASSISTANTS: Dorthey Sawyer RNFA   ANESTHESIA:   spinal  EBL:  1500 mL   BLOOD ADMINISTERED:   CC PRBC and   CC CELLSAVER  DRAINS:  Mediastinal Chest Drains    LOCAL MEDICATIONS USED:  NONE  SPECIMEN:  Source of Specimen:  Sub Aortic Membrane, Aortic Valve Leaflets, Septum  DISPOSITION OF SPECIMEN:  PATHOLOGY  COUNTS:  YES  TOURNIQUET:  * No tourniquets in log *  DICTATION: .Dragon Dictation  PLAN OF CARE: Admit to inpatient   PATIENT DISPOSITION:  ICU - intubated and hemodynamically stable.   Delay start of Pharmacological VTE agent (>24hrs) due to surgical blood loss or risk of bleeding: yes

## 2022-10-30 NOTE — Procedures (Signed)
Extubation Procedure Note Cardiac rapid wean complete w/o complication Pt follows all commands NIF: -22 VC: 1L Cuff leak present ABG within acceptable range No Stridor Clearly speaks name/location Coughs/clears secretions Tolerating 3L   Pt working with IS   Patient Details:   Name: Brett Marshall DOB: 1965/10/07 MRN: [id number]   Airway Documentation:    Vent end date: 10/30/22 Vent end time: 2015   Evaluation  O2 sats: stable throughout Complications: No apparent complications Patient did tolerate procedure well. Bilateral Breath Sounds: Clear, Diminished   Yes  Lianne Bushy 10/30/2022, 8:18 PM

## 2022-10-30 NOTE — Consult Note (Signed)
NAME:  Brett Marshall, MRN:  [id number], DOB:  [birth year], LOS: 0 ADMISSION DATE:  10/30/2022, CONSULTATION DATE:  10/30/22 REFERRING MD:  Leafy Ro - cvts, CHIEF COMPLAINT:  s/p AVR  History of Present Illness:  57 yo M PMH  severe AS, CAD,  diastolic HF, OSA, DM2 who presented 10/30/22 for planned AVR. During case, noted that aortic valve was very fibrotic.     Xclamp- 96 min Total pump- 139 min EBL-1500  Product- 1 FFP 850 cell saver   Post op, admitted to ICU as per pre-op plan. PCCM consulted in this setting   Pertinent  Medical History  Severe AS CAD DM2 HTN HLD Asthma Diastolic HF  Mild pulmonary HTN    Significant Hospital Events: Including procedures, antibiotic start and stop dates in addition to other pertinent events   6/24 AVR  Interim History / Subjective:  POD0 AVR   Arrives to ICU intubated sedated paced and on minimal neo  About 230 in chest tube on arrival to unit   Objective   Blood pressure 99/67, pulse 80, temperature (!) 97 F (36.1 C), resp. rate 16, height 5\' 8"  (1.727 m), weight (!) 143.3 kg, SpO2 100 %. PAP: (12-38)/(-2-23) 29/16 CO:  [2.7 L/min-2.8 L/min] 2.7 L/min CI:  [1.1 L/min/m2] 1.1 L/min/m2  Vent Mode: SIMV;PRVC;PSV FiO2 (%):  [50 %] 50 % Set Rate:  [16 bmp] 16 bmp Vt Set:  [540 mL] 540 mL PEEP:  [5 cmH20] 5 cmH20 Pressure Support:  [10 cmH20] 10 cmH20 Plateau Pressure:  [22 cmH20] 22 cmH20   Intake/Output Summary (Last 24 hours) at 10/30/2022 1356 Last data filed at 10/30/2022 1224 Gross per 24 hour  Intake 3805 ml  Output 2625 ml  Net 1180 ml   Filed Weights   10/30/22 0538  Weight: (!) 143.3 kg    Examination: General: chronically and critically ill appearing obese M Neuro: Sedated. Pupils are 3mm brisk  HENT: [place]. ETT secure w tape. Anicteric sclera. Pink mmm Lungs: Symmetrical chest expansion, mechanically ventilated, diminished   Cardiovascular: paced. Cap refill < 3 sec. Chest tube with about 300 bloody output    Abdomen: obese round soft. OGT with thin blood tinged aspirate   GU: foley  Extremities: No acute joint deformity  Skin: warm, clean. Some moisture dermatitis under skin folds    Resolved Hospital Problem list     Assessment & Plan:   Severe AS s/p AVR  Endotracheally intubated Difficult airway (see intubation note 6/14. Airway edema, small glottic opening, excess supraglottic tissue) ABLA expected Thrombocytopenia, mild and expected  Hypotension, medication related  CAD HTN HLD pHTN Diastolic HF OSA  DM2, poorly controlled Leukocytosis, reactive  GIB- likely trauma related  Moisture dermatitis  P -cxr abg in ICU -was a difficult airway -- before he is a candidate for extubation, needs to be seated upright, maintain SBP < 120  -appreciate crna recs re airway should pt require re-intubation  -VAP, pulm hygiene  -follow chest tube output -- wires/drains/lines per cvts -post-op labs  -complete txa, ppx abx -insulin gtt  -multimodal analgesia  -statin -holding home antihypertensives with hypotension -weaning neo  -follow UOP   -OGT LIMWS -- follow output quantity and quality, but right now think this is most compatible w trauma  -consider woc this admission -- wonder if interdry might be helpful for moist derm.  Best Practice (right click and "Reselect all SmartList Selections" daily)   Diet/type: NPO DVT prophylaxis: not indicated GI prophylaxis: H2B and PPI Lines:  Central line, Arterial Line, and yes and it is still needed Foley:  Yes, and it is still needed Code Status:  full code Last date of multidisciplinary goals of care discussion [--]  Labs   CBC: Recent Labs  Lab 10/28/22 1113 10/30/22 0752 10/30/22 1013 10/30/22 1025 10/30/22 1126 10/30/22 1129 10/30/22 1249  WBC 12.2*  --   --   --   --   --  20.6*  HGB 13.6   < > 8.1* 8.5* 8.8* 9.9* 9.2*  HCT 41.6   < > 24.2* 25.0* 26.0* 29.0* 27.0*  MCV 85.6  --   --   --   --   --  86.8  PLT 222  --   149*  --   --   --  119*   < > = values in this interval not displayed.    Basic Metabolic Panel: Recent Labs  Lab 10/28/22 1113 10/30/22 0752 10/30/22 0837 10/30/22 0857 10/30/22 0901 10/30/22 0928 10/30/22 1025 10/30/22 1126 10/30/22 1129  NA 132* 140 139   < > 140 138 139 140 139  K 4.1 3.9 4.0   < > 3.5 4.0 4.3 4.2 4.2  CL 102 103 103  --   --  102 101  --  104  CO2 17*  --   --   --   --   --   --   --   --   GLUCOSE 198* 195* 195*  --   --  131* 132*  --  167*  BUN 15 11 11   --   --  10 11  --  10  CREATININE 1.20 1.00 0.90  --   --  0.80 0.90  --  0.80  CALCIUM 8.4*  --   --   --   --   --   --   --   --    < > = values in this interval not displayed.   GFR: Estimated Creatinine Clearance: 141.8 mL/min (by C-G formula based on SCr of 0.8 mg/dL). Recent Labs  Lab 10/28/22 1113 10/30/22 1249  WBC 12.2* 20.6*    Liver Function Tests: Recent Labs  Lab 10/28/22 1113  AST 24  ALT 17  ALKPHOS 100  BILITOT 0.9  PROT 7.1  ALBUMIN 3.3*   No results for input(s): "LIPASE", "AMYLASE" in the last 168 hours. No results for input(s): "AMMONIA" in the last 168 hours.  ABG    Component Value Date/Time   PHART 7.436 10/30/2022 1126   PCO2ART 31.2 (L) 10/30/2022 1126   PO2ART 122 (H) 10/30/2022 1126   HCO3 21.0 10/30/2022 1126   TCO2 22 10/30/2022 1129   ACIDBASEDEF 3.0 (H) 10/30/2022 1126   O2SAT 99 10/30/2022 1126     Coagulation Profile: Recent Labs  Lab 10/28/22 1113 10/30/22 1249  INR 1.1 1.6*    Cardiac Enzymes: No results for input(s): "CKTOTAL", "CKMB", "CKMBINDEX", "TROPONINI" in the last 168 hours.  HbA1C: Hgb A1c MFr Bld  Date/Time Value Ref Range Status  10/28/2022 11:13 AM 10.0 (H) 4.8 - 5.6 % Final    Comment:    (NOTE) Pre diabetes:          5.7%-6.4%  Diabetes:              >6.4%  Glycemic control for   <7.0% adults with diabetes   09/07/2022 01:08 PM 11.7 (H) 4.8 - 5.6 % Final    Comment:    (NOTE) Pre diabetes:  5.7%-6.4%  Diabetes:              >6.4%  Glycemic control for   <7.0% adults with diabetes     CBG: Recent Labs  Lab 10/28/22 1039 10/30/22 0535 10/30/22 1246  GLUCAP 176* 199* 130*    Review of Systems:   Unable to obtain, intubated sedated   Past Medical History:  He,  has a past medical history of Asthma, CAD (coronary artery disease), DDD (degenerative disc disease), lumbar, Depression, Diabetes mellitus without complication (HCC), Diarrhea, Essential hypertension, benign, Headache, Heart murmur, HFimpEF (heart failure with improved ejection fraction) (HCC), Hyperlipidemia, Leg swelling, Low back pain, Mixed ICM & NICM, Morbid obesity (HCC), OSA on CPAP, and Pulmonary hypertension (HCC).   Surgical History:   Past Surgical History:  Procedure Laterality Date   CARDIAC CATHETERIZATION     [place] no stents.   COLONOSCOPY WITH PROPOFOL N/A 04/11/2019   Procedure: COLONOSCOPY WITH PROPOFOL;  Surgeon: Midge Minium, MD;  Location: [facility] ENDOSCOPY;  Service: Endoscopy;  Laterality: N/A;   CORONARY STENT INTERVENTION N/A 09/08/2021   Procedure: CORONARY STENT INTERVENTION;  Surgeon: [name], MD;  Location: [facility] INVASIVE CV LAB;  Service: Cardiovascular;  Laterality: N/A;   CORONARY ULTRASOUND/IVUS N/A 09/08/2021   Procedure: Intravascular Ultrasound/IVUS;  Surgeon: [name], MD;  Location: [facility] INVASIVE CV LAB;  Service: Cardiovascular;  Laterality: N/A;   LEFT HEART CATH AND CORONARY ANGIOGRAPHY N/A 09/08/2021   Procedure: LEFT HEART CATH AND CORONARY ANGIOGRAPHY;  Surgeon: [name], MD;  Location: [facility] INVASIVE CV LAB;  Service: Cardiovascular;  Laterality: N/A;   LEFT HEART CATH AND CORONARY ANGIOGRAPHY N/A 09/17/2021   Procedure: LEFT HEART CATH AND CORONARY ANGIOGRAPHY;  Surgeon: Yvonne Kendall, MD;  Location: [facility] INVASIVE CV LAB;  Service: Cardiovascular;  Laterality: N/A;   LEFT HEART CATH AND CORONARY ANGIOGRAPHY N/A 09/08/2022   Procedure:  LEFT HEART CATH AND CORONARY ANGIOGRAPHY;  Surgeon: Yvonne Kendall, MD;  Location: [facility] INVASIVE CV LAB;  Service: Cardiovascular;  Laterality: N/A;   RIGHT/LEFT HEART CATH AND CORONARY ANGIOGRAPHY N/A 09/02/2017   Procedure: RIGHT/LEFT HEART CATH AND CORONARY ANGIOGRAPHY;  Surgeon: [name], MD;  Location: [facility] INVASIVE CV LAB;  Service: Cardiovascular;  Laterality: N/A;   RIGHT/LEFT HEART CATH AND CORONARY ANGIOGRAPHY Bilateral 10/19/2022   Procedure: RIGHT/LEFT HEART CATH AND CORONARY ANGIOGRAPHY;  Surgeon: [name], MD;  Location: [facility] INVASIVE CV LAB;  Service: Cardiovascular;  Laterality: Bilateral;   TEE WITHOUT CARDIOVERSION N/A 09/10/2022   Procedure: TRANSESOPHAGEAL ECHOCARDIOGRAM;  Surgeon: Antonieta Iba, MD;  Location: [facility] ORS;  Service: Cardiovascular;  Laterality: N/A;     Social History:   reports that he has never smoked. He has never used smokeless tobacco. He reports that he does not currently use alcohol. He reports that he does not use drugs.   Family History:  His family history includes Asthma in his mother; Diabetes in his mother; Heart attack in his father; Heart disease in his father; Heart disease (age of onset: 41) in his brother; Sjogren's syndrome in his sister.   Allergies No Known Allergies   Home Medications  Prior to Admission medications   Medication Sig Start Date End Date Taking? Authorizing Provider  acetaminophen (TYLENOL) 325 MG tablet Take 650 mg by mouth every 6 (six) hours as needed for moderate pain.   Yes [provider]  aspirin EC 81 MG tablet Take 1 tablet (81 mg total) by mouth daily. Swallow whole. 12/02/21  Yes  Creig Hines, NP  atorvastatin (LIPITOR) 40 MG tablet Take 1 tablet (40 mg total) by mouth daily at 6 PM. Patient taking differently: Take 40 mg by mouth daily. 09/09/21  Yes Amin, Loura Halt, MD  cyclobenzaprine (FLEXERIL) 10 MG tablet Take 1 tablet (10 mg total) by mouth 3 (three) times  daily as needed.For spasms Patient taking differently: Take 30 mg by mouth every evening. For spasms 11/20/17  Yes Ethelda Chick, MD  diltiazem (CARDIZEM) 30 MG tablet Take 1 tablet (30 mg total) by mouth every 12 (twelve) hours. 09/11/22  Yes Wouk, Wilfred Curtis, MD  DULoxetine (CYMBALTA) 60 MG capsule Take 120 mg by mouth at bedtime. 08/10/22  Yes [provider]  famotidine (PEPCID) 40 MG tablet Take 40 mg by mouth at bedtime. 08/14/18  Yes [provider]  fluticasone (FLONASE) 50 MCG/ACT nasal spray Place 1 spray into both nostrils daily as needed for allergies or rhinitis.   Yes [provider]  fluticasone-salmeterol (ADVAIR) 250-50 MCG/ACT AEPB Inhale 1 puff into the lungs every 12 (twelve) hours. 01/29/22  Yes Glenford Bayley, NP  furosemide (LASIX) 20 MG tablet Take 1 tablet (20 mg total) by mouth daily as needed (for SOB or weight gain of 3 lbs overnight). 07/14/21  Yes [name], MD  gabapentin (NEURONTIN) 300 MG capsule Take 1 capsule by mouth at bedtime. 09/04/22 09/04/23 Yes [provider]  glipiZIDE (GLUCOTROL) 5 MG tablet Take 1 tablet (5 mg total) by mouth 2 (two) times daily before a meal. 12/09/17  Yes Ethelda Chick, MD  isosorbide mononitrate (IMDUR) 30 MG 24 hr tablet Take 0.5 tablets (15 mg total) by mouth daily. 09/11/22  Yes Wouk, Wilfred Curtis, MD  JARDIANCE 25 MG TABS tablet Take 25 mg by mouth daily. 06/06/20  Yes [provider]  metFORMIN (GLUCOPHAGE) 1000 MG tablet Take 1 tablet (1,000 mg total) by mouth 2 (two) times daily with a meal. 12/09/17  Yes Ethelda Chick, MD  metoprolol tartrate (LOPRESSOR) 100 MG tablet Take 1 tablet (100 mg total) by mouth 2 (two) times daily. 08/10/22  Yes End, Cristal Deer, MD  traZODone (DESYREL) 50 MG tablet Take 50 mg by mouth at bedtime. 08/19/21  Yes [provider]  albuterol (VENTOLIN HFA) 108 (90 Base) MCG/ACT inhaler Inhale 1-2 puffs into the lungs every 6 (six) hours as needed  for wheezing or shortness of breath. 09/11/20   Glenford Bayley, NP  glucose blood (GE100 BLOOD GLUCOSE TEST) test strip Use as instructed 02/03/16   Ethelda Chick, MD  Lancets [facility] ULTRASOFT) lancets Check sugar twice daily 01/11/16   Ethelda Chick, MD     Critical care time: 40 min       CRITICAL CARE Performed by: Lanier Clam   Total critical care time: 40 minutes  Critical care time was exclusive of separately billable procedures and treating other patients. Critical care was necessary to treat or prevent imminent or life-threatening deterioration.  Critical care was time spent personally by me on the following activities: development of treatment plan with patient and/or surrogate as well as nursing, discussions with consultants, evaluation of patient's response to treatment, examination of patient, obtaining history from patient or surrogate, ordering and performing treatments and interventions, ordering and review of laboratory studies, ordering and review of radiographic studies, pulse oximetry and re-evaluation of patient's condition.  Tessie Fass MSN, AGACNP-BC [facility] Amion for pager  10/30/2022, 1:56 PM

## 2022-10-30 NOTE — Anesthesia Procedure Notes (Signed)
Arterial Line Insertion Start/End6/14/2024 6:50 AM, 10/30/2022 6:53 AM Performed by: CRNA  Patient location: Pre-op. Preanesthetic checklist: patient identified, IV checked, site marked, risks and benefits discussed, surgical consent, monitors and equipment checked, pre-op evaluation, timeout performed and anesthesia consent Lidocaine 1% used for infiltration Left, radial was placed Catheter size: 20 G Hand hygiene performed , maximum sterile barriers used  and Seldinger technique used Allen's test indicative of satisfactory collateral circulation Attempts: 1 Procedure performed without using ultrasound guided technique. Following insertion, dressing applied and Biopatch. Post procedure assessment: normal  Patient tolerated the procedure well with no immediate complications.

## 2022-10-31 ENCOUNTER — Inpatient Hospital Stay (HOSPITAL_COMMUNITY): Payer: BLUE CROSS/BLUE SHIELD

## 2022-10-31 ENCOUNTER — Encounter (HOSPITAL_COMMUNITY): Payer: Self-pay | Admitting: Thoracic Surgery (Cardiothoracic Vascular Surgery)

## 2022-10-31 ENCOUNTER — Other Ambulatory Visit: Payer: Self-pay | Admitting: Internal Medicine

## 2022-10-31 LAB — BPAM FFP
Blood Product Expiration Date: 202406192359
ISSUE DATE / TIME: 202406141141
ISSUE DATE / TIME: 202406141141
Unit Type and Rh: 600

## 2022-10-31 LAB — GLUCOSE, CAPILLARY
Glucose-Capillary: 117 mg/dL — ABNORMAL HIGH (ref 70–99)
Glucose-Capillary: 123 mg/dL — ABNORMAL HIGH (ref 70–99)
Glucose-Capillary: 127 mg/dL — ABNORMAL HIGH (ref 70–99)
Glucose-Capillary: 127 mg/dL — ABNORMAL HIGH (ref 70–99)
Glucose-Capillary: 146 mg/dL — ABNORMAL HIGH (ref 70–99)
Glucose-Capillary: 151 mg/dL — ABNORMAL HIGH (ref 70–99)
Glucose-Capillary: 177 mg/dL — ABNORMAL HIGH (ref 70–99)
Glucose-Capillary: 189 mg/dL — ABNORMAL HIGH (ref 70–99)
Glucose-Capillary: 190 mg/dL — ABNORMAL HIGH (ref 70–99)
Glucose-Capillary: 197 mg/dL — ABNORMAL HIGH (ref 70–99)

## 2022-10-31 LAB — BASIC METABOLIC PANEL
Anion gap: 10 (ref 5–15)
Anion gap: 9 (ref 5–15)
BUN: 8 mg/dL (ref 6–20)
BUN: 8 mg/dL (ref 6–20)
CO2: 20 mmol/L — ABNORMAL LOW (ref 22–32)
CO2: 23 mmol/L (ref 22–32)
Calcium: 7.9 mg/dL — ABNORMAL LOW (ref 8.9–10.3)
Calcium: 7.9 mg/dL — ABNORMAL LOW (ref 8.9–10.3)
Chloride: 107 mmol/L (ref 98–111)
Chloride: 102 mmol/L (ref 98–111)
Creatinine, Ser: 0.95 mg/dL (ref 0.61–1.24)
Creatinine, Ser: 1.07 mg/dL (ref 0.61–1.24)
GFR, Estimated: >60 mL/min (ref >60)
GFR, Estimated: >60 mL/min (ref >60)
Glucose, Bld: 133 mg/dL — ABNORMAL HIGH (ref 70–99)
Glucose, Bld: 186 mg/dL — ABNORMAL HIGH (ref 70–99)
Potassium: 4.7 mmol/L (ref 3.5–5.1)
Potassium: 4.8 mmol/L (ref 3.5–5.1)
Sodium: 137 mmol/L (ref 135–145)
Sodium: 134 mmol/L — ABNORMAL LOW (ref 135–145)

## 2022-10-31 LAB — ECHO INTRAOPERATIVE TEE
AR max vel: 1.69 cm2
AV Area VTI: 1.61 cm2
AV Area mean vel: 1.59 cm2
AV Mean grad: 14.6 mmHg
AV Peak grad: 22 mmHg
Ao pk vel: 2.35 m/s
Height: 68 in
P 1/2 time: 377 msec
S' Lateral: 2.3 cm
Weight: 5056 oz

## 2022-10-31 LAB — PREPARE FRESH FROZEN PLASMA

## 2022-10-31 LAB — CBC
HCT: 27.4 % — ABNORMAL LOW (ref 39.0–52.0)
HCT: 27 % — ABNORMAL LOW (ref 39.0–52.0)
Hemoglobin: 8.7 g/dL — ABNORMAL LOW (ref 13.0–17.0)
Hemoglobin: 8.6 g/dL — ABNORMAL LOW (ref 13.0–17.0)
MCH: 28 pg (ref 26.0–34.0)
MCH: 27.9 pg (ref 26.0–34.0)
MCHC: 31.8 g/dL (ref 30.0–36.0)
MCHC: 31.9 g/dL (ref 30.0–36.0)
MCV: 88.1 fL (ref 80.0–100.0)
MCV: 87.7 fL (ref 80.0–100.0)
Platelets: 101 10*3/uL — ABNORMAL LOW (ref 150–400)
Platelets: 102 10*3/uL — ABNORMAL LOW (ref 150–400)
RBC: 3.11 MIL/uL — ABNORMAL LOW (ref 4.22–5.81)
RBC: 3.08 MIL/uL — ABNORMAL LOW (ref 4.22–5.81)
RDW: 14.8 % (ref 11.5–15.5)
RDW: 14.9 % (ref 11.5–15.5)
WBC: 12.1 10*3/uL — ABNORMAL HIGH (ref 4.0–10.5)
WBC: 15.6 10*3/uL — ABNORMAL HIGH (ref 4.0–10.5)
nRBC: 0 % (ref 0.0–0.2)
nRBC: 0.1 % (ref 0.0–0.2)

## 2022-10-31 LAB — MAGNESIUM
Magnesium: 2.6 mg/dL — ABNORMAL HIGH (ref 1.7–2.4)
Magnesium: 2.2 mg/dL (ref 1.7–2.4)

## 2022-10-31 MED ORDER — INSULIN DETEMIR 100 UNIT/ML ~~LOC~~ SOLN
30.0000 [IU] | Freq: Every day | SUBCUTANEOUS | Status: DC
  Administered 2022-10-31 – 2022-11-01 (×2): 30 [IU] via SUBCUTANEOUS
  Filled 2022-10-31 (×3): qty 0.3

## 2022-10-31 MED ORDER — INSULIN ASPART 100 UNIT/ML IJ SOLN
0.0000 [IU] | INTRAMUSCULAR | Status: DC
  Administered 2022-10-31: 2 [IU] via SUBCUTANEOUS
  Administered 2022-10-31 – 2022-11-01 (×3): 4 [IU] via SUBCUTANEOUS
  Administered 2022-11-01: 2 [IU] via SUBCUTANEOUS
  Administered 2022-11-01: 8 [IU] via SUBCUTANEOUS

## 2022-10-31 MED ORDER — KETOROLAC TROMETHAMINE 15 MG/ML IJ SOLN
15.0000 mg | Freq: Four times a day (QID) | INTRAMUSCULAR | Status: DC | PRN
  Administered 2022-10-31 – 2022-11-01 (×2): 15 mg via INTRAVENOUS
  Filled 2022-10-31 (×2): qty 1

## 2022-10-31 MED ORDER — FUROSEMIDE 10 MG/ML IJ SOLN
40.0000 mg | Freq: Two times a day (BID) | INTRAMUSCULAR | Status: AC
  Administered 2022-10-31 (×2): 40 mg via INTRAVENOUS
  Filled 2022-10-31 (×2): qty 4

## 2022-10-31 MED ORDER — ENOXAPARIN SODIUM 40 MG/0.4ML IJ SOSY
40.0000 mg | PREFILLED_SYRINGE | Freq: Every day | INTRAMUSCULAR | Status: DC
  Administered 2022-10-31 – 2022-11-04 (×5): 40 mg via SUBCUTANEOUS
  Filled 2022-10-31 (×5): qty 0.4

## 2022-10-31 NOTE — Progress Notes (Signed)
Patient ID: Brett Marshall, male   DOB: 1966-05-07, 57 y.o.   MRN: [id number] TCTS Evening Rounds:  Hemodynamically stable in sinus rhythm.  Diuresed some today.  Sitting up on side of bed and feels better. Back hurts when laying too long.  Pm labs pending.

## 2022-10-31 NOTE — Evaluation (Addendum)
Physical Therapy Evaluation Patient Details Name: Brett Marshall MRN: [id number] DOB: 05-04-1966 Today's Date: 10/31/2022  History of Present Illness  Pt is 57 year old presented to [facility] on  10/30/22 for AVR due to severe AS. PMH - CAD, Aortic stenosis, DM, cardiomyopathy, morbid obesity, pulmonary HTN, depression  Clinical Impression  Pt admitted with above diagnosis and presents to PT with functional limitations due to deficits listed below (See PT problem list). Pt needs skilled PT to maximize independence and safety. Pt able to stand at bedside briefly with rolling walker but c/o feeling light headed and then glazed over and not following commands. Sat pt down and after a few seconds became more responsive. Pt likely orthostatic but unable to get BP at the time due to needing all hands on pt. Expect as medical issues resolve pt will make good progress and expect will be able to return home with wife. If pt doesn't progress as expected will need to reassess dc plan.          Recommendations for follow up therapy are one component of a multi-disciplinary discharge planning process, led by the attending physician.  Recommendations may be updated based on patient status, additional functional criteria and insurance authorization.  Follow Up Recommendations       Assistance Recommended at Discharge Frequent or constant Supervision/Assistance  Patient can return home with the following  A lot of help with walking and/or transfers;A lot of help with bathing/dressing/bathroom;Help with stairs or ramp for entrance;Assist for transportation;Assistance with cooking/housework    Equipment Recommendations Rolling walker (2 wheels);Rollator (4 wheels) (To determine between the two)  Recommendations for Other Services       Functional Status Assessment Patient has had a recent decline in their functional status and demonstrates the ability to make significant improvements in function in a reasonable  and predictable amount of time.     Precautions / Restrictions Precautions Precautions: Fall;Sternal;Other (comment) Precaution Comments: Watch BP. Check orthostatics. Reviewed sternal precautions with pt and wife      Mobility  Bed Mobility Overal bed mobility: Needs Assistance Bed Mobility: Supine to Sit, Sit to Sidelying     Supine to sit: +2 for physical assistance, Mod assist   Sit to sidelying: +2 for physical assistance, Mod assist General bed mobility comments: Assist to bring legs off of bed and elevate trunk into sitting. Assist to lower trunk and bring legs back up into bed    Transfers Overall transfer level: Needs assistance Equipment used: Rolling walker (2 wheels) Transfers: Sit to/from Stand Sit to Stand: +2 physical assistance, Mod assist           General transfer comment: Assist to power up and stabilize. Pt holding heart pillow. Needed verbal/tactile cues to shift weight anteriorly    Ambulation/Gait               General Gait Details: Unable due to pt became light headed (likely orthostatic)  Stairs            Wheelchair Mobility    Modified Rankin (Stroke Patients Only)       Balance Overall balance assessment: Needs assistance Sitting-balance support: No upper extremity supported, Feet supported Sitting balance-Leahy Scale: Fair     Standing balance support: Bilateral upper extremity supported, During functional activity Standing balance-Leahy Scale: Poor Standing balance comment: walker and min assist for static standing  Pertinent Vitals/Pain Pain Assessment Pain Assessment: Faces Faces Pain Scale: Hurts even more Pain Location: chest incision Pain Descriptors / Indicators: Grimacing, Guarding Pain Intervention(s): Limited activity within patient's tolerance, Monitored during session, Repositioned    Home Living Family/patient expects to be discharged to:: Private  residence Living Arrangements: Spouse/significant other Available Help at Discharge: Family;Available 24 hours/day Type of Home: House Home Access: Stairs to enter Entrance Stairs-Rails: Right;Left;Can reach both Entrance Stairs-Number of Steps: 3   Home Layout: One level Home Equipment: None      Prior Function Prior Level of Function : Independent/Modified Independent             Mobility Comments: No assistive device       Hand Dominance        Extremity/Trunk Assessment   Upper Extremity Assessment Upper Extremity Assessment: Defer to OT evaluation    Lower Extremity Assessment Lower Extremity Assessment: Generalized weakness    Cervical / Trunk Assessment Cervical / Trunk Assessment: Other exceptions (obese)  Communication   Communication: No difficulties  Cognition Arousal/Alertness: Awake/alert Behavior During Therapy: WFL for tasks assessed/performed Overall Cognitive Status: Within Functional Limits for tasks assessed                                          General Comments General comments (skin integrity, edema, etc.): Pt likely orthostatic with standing    Exercises     Assessment/Plan    PT Assessment Patient needs continued PT services  PT Problem List Decreased strength;Decreased activity tolerance;Decreased balance;Decreased mobility;Cardiopulmonary status limiting activity;Decreased knowledge of use of DME;Decreased knowledge of precautions;Obesity;Pain       PT Treatment Interventions DME instruction;Gait training;Stair training;Functional mobility training;Therapeutic activities;Therapeutic exercise;Balance training;Patient/family education    PT Goals (Current goals can be found in the Care Plan section)  Acute Rehab PT Goals Patient Stated Goal: return home PT Goal Formulation: With patient Time For Goal Achievement: 11/14/22 Potential to Achieve Goals: Good    Frequency Min 1X/week     Co-evaluation                AM-PAC PT "6 Clicks" Mobility  Outcome Measure Help needed turning from your back to your side while in a flat bed without using bedrails?: Total Help needed moving from lying on your back to sitting on the side of a flat bed without using bedrails?: Total Help needed moving to and from a bed to a chair (including a wheelchair)?: Total Help needed standing up from a chair using your arms (e.g., wheelchair or bedside chair)?: Total Help needed to walk in hospital room?: Total Help needed climbing 3-5 steps with a railing? : Total 6 Click Score: 6    End of Session Equipment Utilized During Treatment: Gait belt;Oxygen Activity Tolerance: Treatment limited secondary to medical complications (Comment) (likely orthostatic) Patient left: in bed;with call bell/phone within reach;with family/visitor present;with nursing/sitter in room Nurse Communication: Mobility status;Other (comment) (likely orthostatic) PT Visit Diagnosis: Other abnormalities of gait and mobility (R26.89);Muscle weakness (generalized) (M62.81);Pain Pain - part of body:  (chest)    Time: 1610-9604 PT Time Calculation (min) (ACUTE ONLY): 28 min   Charges:   PT Evaluation $PT Eval Moderate Complexity: 1 Mod PT Treatments $Therapeutic Activity: 8-22 mins        [facility] PT Acute Rehabilitation Services Office [phone]   Angelina [facility] 10/31/2022, 4:41 PM

## 2022-10-31 NOTE — Progress Notes (Signed)
1 Day Post-Op Procedure(s) (LRB): AORTIC VALVE REPLACEMENT (AVR) USING INSPIRIS RESILIA AORTIC VALVE (N/A) TRANSESOPHAGEAL ECHOCARDIOGRAM (N/A) AORTIC ROOT ENLARGEMENT AND SUBAORTIC MYOMECTOMY (N/A) Subjective: No complaints other than expected chest wall pain.  Objective: Vital signs in last 24 hours: Temp:  [96.6 F (35.9 C)-99 F (37.2 C)] 99 F (37.2 C) (06/15 0700) Pulse Rate:  [77-101] 99 (06/15 0700) Cardiac Rhythm: Normal sinus rhythm (06/15 0400) Resp:  [0-40] 21 (06/15 0700) BP: (98-129)/(57-92) 116/87 (06/15 0700) SpO2:  [95 %-100 %] 100 % (06/15 0700) Arterial Line BP: (76-140)/(47-80) 139/62 (06/15 0700) FiO2 (%):  [40 %-50 %] 40 % (06/14 2312) Weight:  [150.6 kg] 150.6 kg (06/15 0500)  Hemodynamic parameters for last 24 hours: PAP: (23-52)/(11-38) 35/17 CO:  [2.7 L/min-5.7 L/min] 5.5 L/min CI:  [1.1 L/min/m2-2.3 L/min/m2] 2.2 L/min/m2  Intake/Output from previous day: 06/14 0701 - 06/15 0700 In: 6731.9 [I.V.:3096.3; Blood:1255; IV Piggyback:2380.6] Out: 4090 [Urine:2260; Blood:1500; Chest Tube:330] Intake/Output this shift: No intake/output data recorded.  General appearance: alert and cooperative Neurologic: intact Heart: regular rate and rhythm, S1, S2 normal, no murmur Lungs: clear to auscultation bilaterally Extremities: edema moderate Wound: dressing dry  Lab Results: Recent Labs    10/30/22 1826 10/30/22 2008 10/30/22 2104 10/31/22 0417  WBC 11.4*  --   --  12.1*  HGB 8.4*   < > 7.8* 8.7*  HCT 25.7*   < > 23.0* 27.4*  PLT 100*  --   --  101*   < > = values in this interval not displayed.   BMET:  Recent Labs    10/30/22 1826 10/30/22 2008 10/30/22 2104 10/31/22 0417  NA 137   < > 141 137  K 4.8   < > 4.5 4.7  CL 110  --   --  107  CO2 22  --   --  20*  GLUCOSE 109*  --   --  133*  BUN 9  --   --  8  CREATININE 0.90  --   --  0.95  CALCIUM 7.5*  --   --  7.9*   < > = values in this interval not displayed.    PT/INR:   Recent Labs    10/30/22 1249  LABPROT 19.0*  INR 1.6*   ABG    Component Value Date/Time   PHART 7.332 (L) 10/30/2022 2104   HCO3 19.9 (L) 10/30/2022 2104   TCO2 21 (L) 10/30/2022 2104   ACIDBASEDEF 6.0 (H) 10/30/2022 2104   O2SAT 98 10/30/2022 2104   CBG (last 3)  Recent Labs    10/31/22 0202 10/31/22 0420 10/31/22 0638  GLUCAP 117* 127* 151*   CXR: left base atelectasis  ECG: sinus, no heart block  Assessment/Plan: S/P Procedure(s) (LRB): AORTIC VALVE REPLACEMENT (AVR) USING INSPIRIS RESILIA AORTIC VALVE (N/A) TRANSESOPHAGEAL ECHOCARDIOGRAM (N/A) AORTIC ROOT ENLARGEMENT AND SUBAORTIC MYOMECTOMY (N/A)  POD 1 Hemodynamically stable off neo. Continue low dose Lopressor. DC swan and arterial line.  Poorly controlled DM with Hgb A1c of 10.0 preop on metformin. Will start Levemir and SSI and stop insulin drip.   Volume excess: Wt is 16 lbs over preop. Start diuresis.  Pain control: add toradol since creat normal  Dangle and DC chest tubes which should help pain and breathing.  IS, OOB, CPAP when sleeping.   LOS: 1 day    Alleen Borne 10/31/2022

## 2022-11-01 ENCOUNTER — Inpatient Hospital Stay (HOSPITAL_COMMUNITY): Payer: BLUE CROSS/BLUE SHIELD

## 2022-11-01 LAB — GLUCOSE, CAPILLARY
Glucose-Capillary: 137 mg/dL — ABNORMAL HIGH (ref 70–99)
Glucose-Capillary: 207 mg/dL — ABNORMAL HIGH (ref 70–99)
Glucose-Capillary: 156 mg/dL — ABNORMAL HIGH (ref 70–99)
Glucose-Capillary: 154 mg/dL — ABNORMAL HIGH (ref 70–99)
Glucose-Capillary: 174 mg/dL — ABNORMAL HIGH (ref 70–99)
Glucose-Capillary: 138 mg/dL — ABNORMAL HIGH (ref 70–99)

## 2022-11-01 LAB — CBC
HCT: 24.4 % — ABNORMAL LOW (ref 39.0–52.0)
Hemoglobin: 7.8 g/dL — ABNORMAL LOW (ref 13.0–17.0)
MCH: 27.9 pg (ref 26.0–34.0)
MCHC: 32 g/dL (ref 30.0–36.0)
MCV: 87.1 fL (ref 80.0–100.0)
Platelets: 97 10*3/uL — ABNORMAL LOW (ref 150–400)
RBC: 2.8 MIL/uL — ABNORMAL LOW (ref 4.22–5.81)
RDW: 14.8 % (ref 11.5–15.5)
WBC: 11.8 10*3/uL — ABNORMAL HIGH (ref 4.0–10.5)
nRBC: 0 % (ref 0.0–0.2)

## 2022-11-01 LAB — BASIC METABOLIC PANEL
Anion gap: 6 (ref 5–15)
BUN: 12 mg/dL (ref 6–20)
CO2: 24 mmol/L (ref 22–32)
Calcium: 7.8 mg/dL — ABNORMAL LOW (ref 8.9–10.3)
Chloride: 101 mmol/L (ref 98–111)
Creatinine, Ser: 1.22 mg/dL (ref 0.61–1.24)
GFR, Estimated: >60 mL/min (ref >60)
Glucose, Bld: 138 mg/dL — ABNORMAL HIGH (ref 70–99)
Potassium: 4.2 mmol/L (ref 3.5–5.1)
Sodium: 131 mmol/L — ABNORMAL LOW (ref 135–145)

## 2022-11-01 MED ORDER — FE FUM-VIT C-VIT B12-FA 460-60-0.01-1 MG PO CAPS
1.0000 | ORAL_CAPSULE | Freq: Every day | ORAL | Status: DC
  Administered 2022-11-01 – 2022-11-05 (×5): 1 via ORAL
  Filled 2022-11-01 (×5): qty 1

## 2022-11-01 MED ORDER — FUROSEMIDE 10 MG/ML IJ SOLN
40.0000 mg | Freq: Once | INTRAMUSCULAR | Status: AC
  Administered 2022-11-01: 40 mg via INTRAVENOUS
  Filled 2022-11-01: qty 4

## 2022-11-01 MED ORDER — METOPROLOL TARTRATE 25 MG/10 ML ORAL SUSPENSION: 25.0000 mg | Freq: Two times a day (BID) | ORAL | Status: DC

## 2022-11-01 MED ORDER — METOPROLOL TARTRATE 25 MG PO TABS
25.0000 mg | ORAL_TABLET | Freq: Two times a day (BID) | ORAL | Status: DC
  Administered 2022-11-01 – 2022-11-05 (×8): 25 mg via ORAL
  Filled 2022-11-01 (×9): qty 1

## 2022-11-01 MED ORDER — POTASSIUM CHLORIDE CRYS ER 20 MEQ PO TBCR
20.0000 meq | EXTENDED_RELEASE_TABLET | Freq: Two times a day (BID) | ORAL | Status: AC
  Administered 2022-11-01 (×2): 20 meq via ORAL
  Filled 2022-11-01 (×2): qty 1

## 2022-11-01 MED ORDER — INSULIN ASPART 100 UNIT/ML IJ SOLN
0.0000 [IU] | Freq: Three times a day (TID) | INTRAMUSCULAR | Status: DC
  Administered 2022-11-01: 1 [IU] via SUBCUTANEOUS
  Administered 2022-11-01: 2 [IU] via SUBCUTANEOUS
  Administered 2022-11-02 – 2022-11-04 (×6): 4 [IU] via SUBCUTANEOUS
  Administered 2022-11-04 (×2): 2 [IU] via SUBCUTANEOUS
  Administered 2022-11-05: 4 [IU] via SUBCUTANEOUS
  Administered 2022-11-05: 2 [IU] via SUBCUTANEOUS

## 2022-11-01 MED ORDER — FENTANYL CITRATE (PF) 100 MCG/2ML IJ SOLN
INTRAMUSCULAR | Status: AC
  Filled 2022-11-01: qty 2

## 2022-11-01 MED ORDER — OXYCODONE HCL 5 MG PO TABS
ORAL_TABLET | ORAL | Status: AC
  Filled 2022-11-01: qty 1

## 2022-11-01 NOTE — Progress Notes (Signed)
   10/31/22 2300  BiPAP/CPAP/SIPAP  $ Non-Invasive Home Ventilator  Initial  $ Face Mask Large  Yes  BiPAP/CPAP/SIPAP Pt Type Adult  BiPAP/CPAP/SIPAP Resmed  Mask Type Full face mask  Mask Size Large  Respiratory Rate 20 breaths/min  IPAP  (Auto Max 20 - Min 6)  Flow Rate 2 lpm  Patient Home Equipment No  Auto Titrate Yes  Nasal massage performed No (comment)  CPAP/SIPAP surface wiped down Yes  BiPAP/CPAP /SiPAP Vitals  Temp 98.2 F (36.8 C)  Resp (!) 22  Bilateral Breath Sounds Clear;Diminished  MEWS Score/Color  MEWS Score 1  MEWS Score Color Green

## 2022-11-01 NOTE — Progress Notes (Signed)
Patient ID: CHRISTON TINDER, male   DOB: [birth year], 57 y.o.   MRN: [id number] TCTS Evening Rounds:  Hemodynamically stable in sinus rhythm.   Ambulated around the ICU today with a few rest breaks.

## 2022-11-01 NOTE — Progress Notes (Signed)
2 Days Post-Op Procedure(s) (LRB): AORTIC VALVE REPLACEMENT (AVR) USING INSPIRIS RESILIA AORTIC VALVE (N/A) TRANSESOPHAGEAL ECHOCARDIOGRAM (N/A) AORTIC ROOT ENLARGEMENT AND SUBAORTIC MYOMECTOMY (N/A) Subjective: No complaints. Ambulated short distance yesterday. Sitting up in chair. Coughing up some phlegm from sinus drainage.  Objective: Vital signs in last 24 hours: Temp:  [97.9 F (36.6 C)-99.3 F (37.4 C)] 98.5 F (36.9 C) (06/16 0700) Pulse Rate:  [86-119] 101 (06/16 0700) Cardiac Rhythm: Sinus tachycardia (06/16 0400) Resp:  [0-44] 21 (06/16 0700) BP: (72-135)/(39-92) 126/67 (06/16 0630) SpO2:  [91 %-100 %] 96 % (06/16 0630) Arterial Line BP: (83-153)/(56-79) 127/66 (06/15 1405) Weight:  [150.4 kg] 150.4 kg (06/16 0546)  Hemodynamic parameters for last 24 hours: PAP: (33)/(19) 33/19 CO:  [7.4 L/min] 7.4 L/min CI:  [3 L/min/m2] 3 L/min/m2  Intake/Output from previous day: 06/15 0701 - 06/16 0700 In: 758.2 [I.V.:458.1; IV Piggyback:300.1] Out: 2575 [Urine:2525; Chest Tube:50] Intake/Output this shift: No intake/output data recorded.  General appearance: alert and cooperative Neurologic: intact Heart: regular rate and rhythm, S1, S2 normal, no murmur Lungs: clear to auscultation bilaterally Extremities: edema mild  Wound: dressing dry  Lab Results: Recent Labs    10/31/22 1627 11/01/22 0452  WBC 15.6* 11.8*  HGB 8.6* 7.8*  HCT 27.0* 24.4*  PLT 102* 97*   BMET:  Recent Labs    10/31/22 1627 11/01/22 0452  NA 134* 131*  K 4.8 4.2  CL 102 101  CO2 23 24  GLUCOSE 186* 138*  BUN 8 12  CREATININE 1.07 1.22  CALCIUM 7.9* 7.8*    PT/INR:  Recent Labs    10/30/22 1249  LABPROT 19.0*  INR 1.6*   ABG    Component Value Date/Time   PHART 7.332 (L) 10/30/2022 2104   HCO3 19.9 (L) 10/30/2022 2104   TCO2 21 (L) 10/30/2022 2104   ACIDBASEDEF 6.0 (H) 10/30/2022 2104   O2SAT 98 10/30/2022 2104   CBG (last 3)  Recent Labs    10/31/22 1938  10/31/22 2349 11/01/22 0425  GLUCAP 197* 189* 137*   CXR: mild left basilar atelectasis  Assessment/Plan: S/P Procedure(s) (LRB): AORTIC VALVE REPLACEMENT (AVR) USING INSPIRIS RESILIA AORTIC VALVE (N/A) TRANSESOPHAGEAL ECHOCARDIOGRAM (N/A) AORTIC ROOT ENLARGEMENT AND SUBAORTIC MYOMECTOMY (N/A)  POD 2 Hemodynamically stable in sinus rhythm. HR 102. Will increase Lopressor to 25 bid.   Poorly controlled DM with Hgb A1c of 10.0 preop on metformin. Glucose under adequate control on  Levemir and SSI.    Volume excess: -1800 cc yesterday but wt unchanged at 16 lbs over preop if accurate. Continue diuretic.  DC pacing wires and foley today. Will keep sleeve in for access today and probably remove tomorrow.  I think he will benefit from staying in ICU today to assist with ambulation due to super morbid obesity.   LOS: 2 days    Alleen Borne 11/01/2022

## 2022-11-01 NOTE — Progress Notes (Signed)
   11/01/22 2347  BiPAP/CPAP/SIPAP  BiPAP/CPAP/SIPAP Pt Type Adult  BiPAP/CPAP/SIPAP Resmed  Mask Type Full face mask  Mask Size Large  IPAP  (Auto Max 20 - Min 6 - home settings)  Flow Rate 2 lpm  Patient Home Equipment No  Auto Titrate Yes  Nasal massage performed Yes  CPAP/SIPAP surface wiped down Yes  BiPAP/CPAP /SiPAP Vitals  Pulse Rate 88  Resp (!) 21  Bilateral Breath Sounds Clear;Diminished

## 2022-11-02 ENCOUNTER — Other Ambulatory Visit (HOSPITAL_COMMUNITY): Payer: Self-pay

## 2022-11-02 ENCOUNTER — Other Ambulatory Visit: Payer: Self-pay | Admitting: Cardiology

## 2022-11-02 DIAGNOSIS — Z952 Presence of prosthetic heart valve: Secondary | ICD-10-CM

## 2022-11-02 LAB — BASIC METABOLIC PANEL
Anion gap: 8 (ref 5–15)
BUN: 17 mg/dL (ref 6–20)
CO2: 24 mmol/L (ref 22–32)
Calcium: 7.8 mg/dL — ABNORMAL LOW (ref 8.9–10.3)
Chloride: 101 mmol/L (ref 98–111)
Creatinine, Ser: 1.3 mg/dL — ABNORMAL HIGH (ref 0.61–1.24)
GFR, Estimated: >60 mL/min (ref >60)
Glucose, Bld: 105 mg/dL — ABNORMAL HIGH (ref 70–99)
Potassium: 4.2 mmol/L (ref 3.5–5.1)
Sodium: 133 mmol/L — ABNORMAL LOW (ref 135–145)

## 2022-11-02 LAB — CBC
HCT: 24 % — ABNORMAL LOW (ref 39.0–52.0)
Hemoglobin: 7.6 g/dL — ABNORMAL LOW (ref 13.0–17.0)
MCH: 27.8 pg (ref 26.0–34.0)
MCHC: 31.7 g/dL (ref 30.0–36.0)
MCV: 87.9 fL (ref 80.0–100.0)
Platelets: 117 10*3/uL — ABNORMAL LOW (ref 150–400)
RBC: 2.73 MIL/uL — ABNORMAL LOW (ref 4.22–5.81)
RDW: 14.9 % (ref 11.5–15.5)
WBC: 10.9 10*3/uL — ABNORMAL HIGH (ref 4.0–10.5)
nRBC: 0.3 % — ABNORMAL HIGH (ref 0.0–0.2)

## 2022-11-02 LAB — GLUCOSE, CAPILLARY
Glucose-Capillary: 181 mg/dL — ABNORMAL HIGH (ref 70–99)
Glucose-Capillary: 182 mg/dL — ABNORMAL HIGH (ref 70–99)
Glucose-Capillary: 161 mg/dL — ABNORMAL HIGH (ref 70–99)
Glucose-Capillary: 119 mg/dL — ABNORMAL HIGH (ref 70–99)

## 2022-11-02 LAB — SURGICAL PATHOLOGY

## 2022-11-02 MED ORDER — ORAL CARE MOUTH RINSE: 15.0000 mL | OROMUCOSAL | Status: DC | PRN

## 2022-11-02 MED ORDER — INSULIN DETEMIR 100 UNIT/ML ~~LOC~~ SOLN
34.0000 [IU] | Freq: Every day | SUBCUTANEOUS | Status: DC
  Administered 2022-11-02: 34 [IU] via SUBCUTANEOUS
  Filled 2022-11-02 (×2): qty 0.34

## 2022-11-02 MED ORDER — SORBITOL 70 % SOLN
30.0000 mL | Freq: Once | Status: AC
  Administered 2022-11-02: 30 mL via ORAL
  Filled 2022-11-02: qty 30

## 2022-11-02 MED ORDER — SODIUM CHLORIDE 0.9% FLUSH: 3.0000 mL | INTRAVENOUS | Status: DC | PRN

## 2022-11-02 MED ORDER — SODIUM CHLORIDE 0.9% FLUSH
3.0000 mL | Freq: Two times a day (BID) | INTRAVENOUS | Status: DC
  Administered 2022-11-02 – 2022-11-05 (×7): 3 mL via INTRAVENOUS

## 2022-11-02 MED ORDER — SODIUM CHLORIDE 0.9 % IV SOLN: 250.0000 mL | INTRAVENOUS | Status: DC | PRN

## 2022-11-02 MED ORDER — INSULIN STARTER KIT- PEN NEEDLES (ENGLISH)
1.0000 | Freq: Once | Status: AC
  Administered 2022-11-02: 1
  Filled 2022-11-02: qty 1

## 2022-11-02 MED ORDER — FUROSEMIDE 10 MG/ML IJ SOLN
40.0000 mg | Freq: Once | INTRAMUSCULAR | Status: AC
  Administered 2022-11-02: 40 mg via INTRAVENOUS
  Filled 2022-11-02: qty 4

## 2022-11-02 MED ORDER — ~~LOC~~ CARDIAC SURGERY, PATIENT & FAMILY EDUCATION
Freq: Once | Status: AC
  Administered 2022-11-02: 1

## 2022-11-02 MED ORDER — POTASSIUM CHLORIDE CRYS ER 20 MEQ PO TBCR
20.0000 meq | EXTENDED_RELEASE_TABLET | Freq: Once | ORAL | Status: AC
  Administered 2022-11-02: 20 meq via ORAL
  Filled 2022-11-02: qty 1

## 2022-11-02 NOTE — Progress Notes (Signed)
Patient stated that he got a little bit dizzy coming back from bathroom after having a bowel movement.  Told patient to please call before getting up to go to the bathroom. Will continue to monitor

## 2022-11-02 NOTE — Progress Notes (Signed)
Physical Therapy Treatment Patient Details Name: Brett Marshall MRN: [id number] DOB: [birth year] Today's Date: 11/02/2022   History of Present Illness Pt is 57 year old presented to [facility] on  10/30/22 for AVR due to severe AS. PMH - CAD, Aortic stenosis, DM, cardiomyopathy, morbid obesity, pulmonary HTN, depression    PT Comments    Pt seen in session with OT to progress mobility OOB. Pt with signs/symptoms of orthostatic hypotension during PT eval. Today, pt required +2 mod assist bed mobility, min assist sit to stand, and min guard assist amb 180' with eva walker. SpO2 100% at rest on 1L. Pt mobilized in room on RA with SpO2 96%. Amb on 2L with SpO2 96-100%. Max HR 135. BP stable. Pt with c/o mild dizziness. Reviewed sternal precautions. Pt in recliner at end of session.     Recommendations for follow up therapy are one component of a multi-disciplinary discharge planning process, led by the attending physician.  Recommendations may be updated based on patient status, additional functional criteria and insurance authorization.  Follow Up Recommendations       Assistance Recommended at Discharge Frequent or constant Supervision/Assistance  Patient can return home with the following A little help with walking and/or transfers;A lot of help with bathing/dressing/bathroom;Assistance with cooking/housework;Assist for transportation;Help with stairs or ramp for entrance   Equipment Recommendations  Rollator (4 wheels)    Recommendations for Other Services       Precautions / Restrictions Precautions Precautions: Fall;Sternal;Other (comment) Precaution Booklet Issued: Yes (comment) (educated pt on sternal prec) Precaution Comments: Watch BP Restrictions Weight Bearing Restrictions: Yes RUE Weight Bearing: Touch down weight bearing LUE Weight Bearing: Touch down weight bearing Other Position/Activity Restrictions: sternal prec     Mobility  Bed Mobility Overal bed mobility: Needs  Assistance Bed Mobility: Supine to Sit     Supine to sit: Mod assist, +2 for physical assistance     General bed mobility comments: assist with BLE and trunk, cues for sternal precautions    Transfers Overall transfer level: Needs assistance Equipment used: Ambulation equipment used Transfers: Sit to/from Stand Sit to Stand: Min assist           General transfer comment: assist to power up with pt holding heart pillow    Ambulation/Gait Ambulation/Gait assistance: Min guard Gait Distance (Feet): 180 Feet Assistive device: Fara Boros Gait Pattern/deviations: Step-through pattern, Decreased stride length Gait velocity: decreased Gait velocity interpretation: 1.31 - 2.62 ft/sec, indicative of limited community ambulator   General Gait Details: Standing rest break after 100'. BP stable. Pt with c/o mild dizziness. SpO2 96-100% on RA. HR up to 135. Placed on 2L to assist with cardiac demand.   Stairs             Wheelchair Mobility    Modified Rankin (Stroke Patients Only)       Balance Overall balance assessment: Needs assistance Sitting-balance support: Feet supported, No upper extremity supported Sitting balance-Leahy Scale: Good     Standing balance support: During functional activity, Single extremity supported, Bilateral upper extremity supported Standing balance-Leahy Scale: Poor Standing balance comment: reliant on external support                            Cognition Arousal/Alertness: Awake/alert Behavior During Therapy: WFL for tasks assessed/performed Overall Cognitive Status: Within Functional Limits for tasks assessed  Exercises      General Comments General comments (skin integrity, edema, etc.): Pt resting on 1L on arrival, SpO2 100%. Mobilized in room on RA with SpO2 96%. Placed on 2L during amb due to increased HR, although SpO2 remained stable in upper 90s.       Pertinent Vitals/Pain Pain Assessment Pain Assessment: Faces Faces Pain Scale: Hurts even more Pain Location: chest incision Pain Descriptors / Indicators: Grimacing, Guarding, Discomfort Pain Intervention(s): Monitored during session, Repositioned    Home Living Family/patient expects to be discharged to:: Private residence Living Arrangements: Spouse/significant other Available Help at Discharge: Family;Available 24 hours/day Type of Home: House Home Access: Stairs to enter Entrance Stairs-Rails: Right;Left;Can reach both Entrance Stairs-Number of Steps: 3   Home Layout: One level Home Equipment: Cane - single point Additional Comments: pt interested in getting a lift chair    Prior Function            PT Goals (current goals can now be found in the care plan section) Acute Rehab PT Goals Patient Stated Goal: home Progress towards PT goals: Progressing toward goals    Frequency           PT Plan Current plan remains appropriate    Co-evaluation PT/OT/SLP Co-Evaluation/Treatment: Yes Reason for Co-Treatment: To address functional/ADL transfers;For patient/therapist safety PT goals addressed during session: Mobility/safety with mobility;Balance OT goals addressed during session: ADL's and self-care      AM-PAC PT "6 Clicks" Mobility   Outcome Measure  Help needed turning from your back to your side while in a flat bed without using bedrails?: A Lot Help needed moving from lying on your back to sitting on the side of a flat bed without using bedrails?: Total Help needed moving to and from a bed to a chair (including a wheelchair)?: A Little Help needed standing up from a chair using your arms (e.g., wheelchair or bedside chair)?: A Little Help needed to walk in hospital room?: A Little Help needed climbing 3-5 steps with a railing? : A Lot 6 Click Score: 14    End of Session Equipment Utilized During Treatment: Gait belt;Oxygen Activity Tolerance:  Patient tolerated treatment well Patient left: in chair;with call bell/phone within reach Nurse Communication: Mobility status PT Visit Diagnosis: Other abnormalities of gait and mobility (R26.89);Muscle weakness (generalized) (M62.81);Pain     Time: 0935-1010 PT Time Calculation (min) (ACUTE ONLY): 35 min  Charges:  $Gait Training: 8-22 mins                     Ferd Glassing., PT  Office # [phone]    Ilda Foil 11/02/2022, 11:22 AM

## 2022-11-02 NOTE — Progress Notes (Signed)
Post op echo for AVR replacement. Primary cardiologist Dr. Kirke Corin. CT surgeon Dr. Mardee Postin.

## 2022-11-02 NOTE — Progress Notes (Addendum)
TCTS DAILY ICU PROGRESS NOTE                   [address]            [place] [address]          [phone]   3 Days Post-Op Procedure(s) (LRB): AORTIC VALVE REPLACEMENT (AVR) USING INSPIRIS RESILIA AORTIC VALVE (N/A) TRANSESOPHAGEAL ECHOCARDIOGRAM (N/A) AORTIC ROOT ENLARGEMENT AND SUBAORTIC MYOMECTOMY (N/A)  Total Length of Stay:  LOS: 3 days   Subjective: Patient already walked this am. He is sitting in the chair. He is passing gas but no bowel movement yet.  Objective: Vital signs in last 24 hours: Temp:  [98.3 F (36.8 C)-98.8 F (37.1 C)] 98.3 F (36.8 C) (06/16 1900) Pulse Rate:  [81-114] 90 (06/17 0400) Cardiac Rhythm: Normal sinus rhythm (06/17 0000) Resp:  [13-34] 25 (06/17 0400) BP: (88-141)/(42-98) 105/78 (06/17 0400) SpO2:  [83 %-100 %] 100 % (06/17 0400) Weight:  [151.2 kg] 151.2 kg (06/17 0610)  Filed Weights   10/31/22 0500 11/01/22 0546 11/02/22 0610  Weight: (!) 150.6 kg (!) 150.4 kg (!) 151.2 kg    Weight change: 0.8 kg      Intake/Output from previous day: 06/16 0701 - 06/17 0700 In: 429.6 [P.O.:240; I.V.:189.6] Out: 580 [Urine:580]  Intake/Output this shift: Total I/O In: -  Out: 500 [Urine:500]  Current Meds: Scheduled Meds:  acetaminophen  1,000 mg Oral Q6H   aspirin EC  325 mg Oral Daily   atorvastatin  40 mg Oral q1800   bisacodyl  10 mg Oral Daily   Chlorhexidine Gluconate Cloth  6 each Topical Q0600   docusate sodium  200 mg Oral Daily   DULoxetine  120 mg Oral QHS   enoxaparin (LOVENOX) injection  40 mg Subcutaneous QHS   famotidine  40 mg Oral QHS   Fe Fum-Vit C-Vit B12-FA  1 capsule Oral QPC breakfast   fluticasone furoate-vilanterol  1 puff Inhalation Daily   gabapentin  300 mg Oral QHS   insulin aspart  0-24 Units Subcutaneous TID WC   insulin detemir  30 Units Subcutaneous Daily   metoprolol tartrate  25 mg Oral BID   pantoprazole  40 mg Oral Daily   sodium chloride flush  3 mL Intravenous  Q12H   Continuous Infusions:  lactated ringers 20 mL/hr at 11/01/22 1800   PRN Meds:.albuterol, dextrose, metoprolol tartrate, morphine injection, ondansetron (ZOFRAN) IV, oxyCODONE, sodium chloride flush, traMADol, traZODone  General appearance: alert, cooperative, and no distress Neurologic: intact Heart: Slightly tachycardic Lungs: Diminished bibasilar breath sounds Abdomen: Soft, obese, non tender, bowel sounds Extremities: Bilateral LE edema Wound: Aquacel removed and sternal wound is clean and dry  Lab Results: CBC: Recent Labs    11/01/22 0452 11/02/22 0413  WBC 11.8* 10.9*  HGB 7.8* 7.6*  HCT 24.4* 24.0*  PLT 97* 117*   BMET:  Recent Labs    11/01/22 0452 11/02/22 0413  NA 131* 133*  K 4.2 4.2  CL 101 101  CO2 24 24  GLUCOSE 138* 105*  BUN 12 17  CREATININE 1.22 1.30*  CALCIUM 7.8* 7.8*    CMET: Lab Results  Component Value Date   WBC 10.9 (H) 11/02/2022   HGB 7.6 (L) 11/02/2022   HCT 24.0 (L) 11/02/2022   PLT 117 (L) 11/02/2022   GLUCOSE 105 (H) 11/02/2022   CHOL 109 09/08/2022   TRIG 138 09/08/2022   HDL 31 (L) 09/08/2022   LDLCALC 50 09/08/2022  ALT 17 10/28/2022   AST 24 10/28/2022   NA 133 (L) 11/02/2022   K 4.2 11/02/2022   CL 101 11/02/2022   CREATININE 1.30 (H) 11/02/2022   BUN 17 11/02/2022   CO2 24 11/02/2022   TSH 2.82 02/08/2016   INR 1.6 (H) 10/30/2022   HGBA1C 10.0 (H) 10/28/2022     PT/INR:  Recent Labs    10/30/22 1249  LABPROT 19.0*  INR 1.6*   Radiology: No results found.   Assessment/Plan: S/P Procedure(s) (LRB): AORTIC VALVE REPLACEMENT (AVR) USING INSPIRIS RESILIA AORTIC VALVE (N/A) TRANSESOPHAGEAL ECHOCARDIOGRAM (N/A) AORTIC ROOT ENLARGEMENT AND SUBAORTIC MYOMECTOMY (N/A) CV-SR. On Lopressor 25 mg bid;SBP low 100's. Will titrate Lopressor as able. Pulmonary-History of OSA (uses CPAP at night). On 3 liters of oxygen via [place]. Wean as able. Check CXR in am.Encourage incentive spirometer. Volume  overload-will give Lasix 40 mg IV this am Expected post op blood loss anemia-H and H this am 7.6 and 24. Continue Trigels Forte. 5. DM-CBGs 154/174/138. Pre op HGA1C 10. On Insulin. Will restart Metformin and Jardiance later in hospital course. Will restart Glipizide in am 6. Thrombocytopenia-platelets this am up to 117,000 7. Creatinine this am 1.3 8. LOC constipation 9. Remove sleeve 10. Hope to transfer to 4E  Gordon Carlson Margaretann Loveless PA-C 11/02/2022 6:57 AM

## 2022-11-02 NOTE — TOC Initial Note (Signed)
Transition of Care [facility]) - Initial/Assessment Note    Patient Details  Name: Brett Marshall MRN: [id number] Date of Birth: July 17, 1965  Transition of Care [facility]) CM/SW Contact:    Elliot Cousin, RN Phone Number: [phone] 11/02/2022, 4:31 PM  Clinical Narrative:  CM spoke to pt and wife at bedside. HH preoperatively arranged with [facility]. Contacted [employer] rep, Jermaine for [employer] with seat. Pt declined bedside commode. CM notified [facility] rep, Amy to confirm [facility] agency following for [facility].                  Expected Discharge Plan: Home w Home Health Services Barriers to Discharge: Continued Medical Work up   Patient Goals and CMS Choice Patient states their goals for this hospitalization and ongoing recovery are:: wants to get better CMS Medicare.gov Compare Post Acute Care list provided to:: Patient Choice offered to / list presented to : Patient      Expected Discharge Plan and Services   Discharge Planning Services: CM Consult Post Acute Care Choice: Home Health Living arrangements for the past 2 months: Single Family Home                 DME Arranged: 3-N-1, Walker rolling with seat DME Agency: [employer] Date DME Agency Contacted: 11/02/22 Time DME Agency Contacted: [phone] Representative spoke with at DME Agency: Shaune Leeks HH Arranged: RN HH Agency: [facility] Date [facility] Agency Contacted: 11/02/22 Time HH Agency Contacted: 1623 Representative spoke with at [facility] Agency: Amy  Prior Living Arrangements/Services Living arrangements for the past 2 months: Single Family Home Lives with:: Spouse                   Activities of Daily Living      Permission Sought/Granted Permission sought to share information with : Case Manager, Family Supports, PCP Permission granted to share information with : Yes, Verbal Permission Granted  Share Information with NAME: Brett Marshall  Permission granted to share info w AGENCY: Home  Health  Permission granted to share info w Relationship: wife  Permission granted to share info w Contact Information: [phone]  Emotional Assessment              Admission diagnosis:  S/P AVR (aortic valve replacement) [Z95.2] Patient Active Problem List   Diagnosis Date Noted   S/P AVR (aortic valve replacement) 10/30/2022   Endotracheal tube present 10/30/2022   Difficult airway for intubation 10/30/2022   Severe aortic stenosis 10/30/2022   Aortic valve stenosis 10/19/2022   Mild intermittent asthma without complication 09/14/2022   Angina pectoris (HCC) 09/08/2022   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   Chronic diastolic CHF (congestive heart failure) (HCC) 09/07/2022   Abdominal pain 09/07/2022   HLD (hyperlipidemia) 09/07/2022   CAD (coronary artery disease) 09/07/2022   Depression 09/07/2022   Dark stools 09/07/2022   AKI (acute kidney injury) (HCC) 09/17/2021   Obstructive hypertrophic cardiomyopathy (HCC) 09/17/2021   Coronary artery disease involving native coronary artery of native heart with unstable angina pectoris (HCC)    Spontaneous hematoma of forearm    Unstable angina (HCC)    Chest pain 09/05/2021   Controlled type 2 diabetes mellitus without complication, without long-term current use of insulin (HCC) 09/05/2021   Dyspnea 10/21/2020   Hypercholesterolemia 06/05/2019   Encounter for screening colonoscopy    Polyp of sigmoid colon    Gastroesophageal reflux disease without esophagitis 04/10/2018   OSA on CPAP 04/10/2018  Heart failure with mildly reduced ejection fraction (HFmrEF) (HCC) 12/09/2017   Pulmonary hypertension (HCC) 12/09/2017   NASH (nonalcoholic steatohepatitis) 11/07/2017   Essential hypertension 09/13/2016   DISH (diffuse idiopathic skeletal hyperostosis) 05/17/2016   DDD (degenerative disc disease), lumbar 05/17/2016   Type 2 diabetes mellitus without complication, without long-term current use of insulin  (HCC) 02/08/2016   Gynecomastia 02/08/2016   Morbid obesity (HCC) 02/08/2016   PCP:  Ethelda Chick, MD Pharmacy:   [facility] - [place], [place] - [address] [place] [address] Phone: [phone] Fax: [phone]  [id number] - [place], [place] - [address] [place] [address] Phone: [phone] Fax: [phone]     Social Determinants of Health (SDOH) Social History: SDOH Screenings   Food Insecurity: No Food Insecurity (09/07/2022)  Housing: Low Risk  (09/07/2022)  Transportation Needs: No Transportation Needs (09/07/2022)  Utilities: Not At Risk (09/07/2022)  Depression (PHQ2-9): Low Risk  (12/29/2021)  Recent Concern: Depression (PHQ2-9) - Medium Risk (12/08/2021)  Tobacco Use: Low Risk  (10/31/2022)   SDOH Interventions:     Readmission Risk Interventions     No data to display

## 2022-11-02 NOTE — TOC Benefit Eligibility Note (Signed)
Pharmacy Patient Advocate Encounter  Insurance verification completed.    The patient is insured through [employer] test claim for [employer] and the current 30 day co-pay is $30.00.   This test claim was processed through [facility]- copay amounts may vary at other pharmacies due to pharmacy/plan contracts, or as the patient moves through the different stages of their insurance plan.

## 2022-11-02 NOTE — Inpatient Diabetes Management (Signed)
Inpatient Diabetes Program Recommendations  AACE/ADA: New Consensus Statement on Inpatient Glycemic Control (2015)  Target Ranges:  Prepandial:   less than 140 mg/dL      Peak postprandial:   less than 180 mg/dL (1-2 hours)      Critically ill patients:  140 - 180 mg/dL   Lab Results  Component Value Date   GLUCAP 182 (H) 11/02/2022   HGBA1C 10.0 (H) 10/28/2022    Review of Glycemic Control  Latest Reference Range & Units 11/01/22 19:57 11/01/22 22:09 11/02/22 08:14 11/02/22 12:45  Glucose-Capillary 70 - 99 mg/dL 161 (H) 096 (H) 045 (H) 182 (H)  (H): Data is abnormally high Diabetes history: Type 2 DM Outpatient Diabetes medications: Jardiance 25 mg every day, Glipizide 5 mg BID, Metformin 1000 mg BID Current orders for Inpatient glycemic control: Levemir 34 units every day, Novolog 0-24 units TID  Inpatient Diabetes Program Recommendations:    Spoke with patient regarding outpatient diabetes management. Patient verifies medications listed above.  Reviewed patient's current A1c of 10.0%. Explained what a A1c is and what it measures. Also reviewed goal A1c with patient, importance of good glucose control @ home, and blood sugar goals. Reviewed patho of DM, need for insulin, role of pancreas, impact from cardiac perspective, increased risk of infection, survival skills, interventions, vascular changes and commorbidities.  Patient has meter and testing supplies. Has used FSL 3 in the past; additional sensors supplied in preparation for discharge. Patient aware of application, how to use, and when to reach out to Md. Patient has follow up appointment scheduled.  Educated patient and spouse on insulin pen use at home. Reviewed contents of insulin flexpen starter kit. Reviewed all steps if insulin pen including attachment of needle, 2-unit air shot, dialing up dose, giving injection, removing needle, disposal of sharps, storage of unused insulin, disposal of insulin etc. Patient able to provide  successful return demonstration. Also reviewed troubleshooting with insulin pen. MD to give patient Rxs for insulin pens and insulin pen needles. Anticipate need for insulin at discharge. Secure chat sent to pharmacy to determine benefits.   Thanks, Lujean Rave, MSN, RNC-OB Diabetes Coordinator [phone] (8a-5p)

## 2022-11-02 NOTE — Progress Notes (Signed)
Patient brought to 4E from 2H. VSS. Telemetry box applied, CCMD notified. Patient oriented to room and staff. Call bell in reach. ? ?Brett Marshall L Muna Demers, RN  ?

## 2022-11-02 NOTE — Evaluation (Signed)
Occupational Therapy Evaluation Patient Details Name: Brett Marshall MRN: [id number] DOB: 06/24/1965 Today's Date: 11/02/2022   History of Present Illness Pt is 57 year old presented to [facility] on  10/30/22 for AVR due to severe AS. PMH - CAD, Aortic stenosis, DM, cardiomyopathy, morbid obesity, pulmonary HTN, depression   Clinical Impression   Pt reports independence at baseline with ADLs/functional mobility, lives with spouse who can assist at d/c. Pt currently needing set up - mod A for ADLs, mod A +2 for bed mobility, and min A for transfers with eva walker. VSS on RA, although pt reporting dizziness upon initially sitting EOB. Pt educated on sternal prec and provided with handout. Pt presenting with impairments listed below, will follow acutely. Recommend HHOT at d/c.       Recommendations for follow up therapy are one component of a multi-disciplinary discharge planning process, led by the attending physician.  Recommendations may be updated based on patient status, additional functional criteria and insurance authorization.   Assistance Recommended at Discharge Frequent or constant Supervision/Assistance  Patient can return home with the following A little help with walking and/or transfers;A lot of help with bathing/dressing/bathroom;Assistance with cooking/housework;Direct supervision/assist for medications management;Direct supervision/assist for financial management;Assist for transportation;Help with stairs or ramp for entrance    Functional Status Assessment  Patient has had a recent decline in their functional status and demonstrates the ability to make significant improvements in function in a reasonable and predictable amount of time.  Equipment Recommendations  BSC/3in1    Recommendations for Other Services PT consult     Precautions / Restrictions Precautions Precautions: Fall;Sternal;Other (comment) Precaution Booklet Issued: Yes (comment) (educated pt on sternal  prec) Precaution Comments: Watch BP. Check orthostatics. Reviewed sternal precautions with pt and wife Restrictions Other Position/Activity Restrictions: sternal prec      Mobility Bed Mobility Overal bed mobility: Needs Assistance Bed Mobility: Supine to Sit     Supine to sit: Mod assist, +2 for physical assistance          Transfers Overall transfer level: Needs assistance Equipment used:  (eva walker) Transfers: Sit to/from Stand Sit to Stand: Min assist                  Balance Overall balance assessment: Needs assistance Sitting-balance support: Feet supported Sitting balance-Leahy Scale: Good     Standing balance support: Bilateral upper extremity supported, During functional activity Standing balance-Leahy Scale: Poor Standing balance comment: reliant on eva walker support                           ADL either performed or assessed with clinical judgement   ADL Overall ADL's : Needs assistance/impaired Eating/Feeding: Set up;Sitting   Grooming: Min guard;Standing;Oral care   Upper Body Bathing: Minimal assistance;Sitting   Lower Body Bathing: Sitting/lateral leans;Sit to/from stand;Moderate assistance   Upper Body Dressing : Minimal assistance;Sitting   Lower Body Dressing: Moderate assistance;Sitting/lateral leans   Toilet Transfer: Minimal assistance           Functional mobility during ADLs: Minimal assistance (eva walker)       Vision   Vision Assessment?: No apparent visual deficits     Perception Perception Perception Tested?: No   Praxis Praxis Praxis tested?: Not tested    Pertinent Vitals/Pain Pain Assessment Pain Assessment: Faces Pain Score: 6  Faces Pain Scale: Hurts even more Pain Location: chest incision Pain Descriptors / Indicators: Grimacing, Guarding Pain Intervention(s): Limited activity within  patient's tolerance, Monitored during session, Repositioned     Hand Dominance     Extremity/Trunk  Assessment Upper Extremity Assessment Upper Extremity Assessment: Generalized weakness (within limits of sternal prec)   Lower Extremity Assessment Lower Extremity Assessment: Defer to PT evaluation   Cervical / Trunk Assessment Cervical / Trunk Assessment: Other exceptions (body habitus)   Communication Communication Communication: No difficulties   Cognition Arousal/Alertness: Awake/alert Behavior During Therapy: WFL for tasks assessed/performed Overall Cognitive Status: Within Functional Limits for tasks assessed                                       General Comments  VSS on RA    Exercises     Shoulder Instructions      Home Living Family/patient expects to be discharged to:: Private residence Living Arrangements: Spouse/significant other Available Help at Discharge: Family;Available 24 hours/day Type of Home: House Home Access: Stairs to enter [employer] of Steps: 3 Entrance Stairs-Rails: Right;Left;Can reach both Home Layout: One level     Bathroom Shower/Tub: Chief Strategy Officer: Handicapped height     Home Equipment: Cane - single point   Additional Comments: pt interested in getting a lift chair      Prior Functioning/Environment Prior Level of Function : Independent/Modified Independent                        OT Problem List: Decreased strength;Decreased range of motion;Decreased activity tolerance;Impaired balance (sitting and/or standing);Decreased cognition;Decreased safety awareness;Decreased knowledge of precautions;Cardiopulmonary status limiting activity      OT Treatment/Interventions: Self-care/ADL training;Therapeutic exercise;Energy conservation;DME and/or AE instruction    OT Goals(Current goals can be found in the care plan section) Acute Rehab OT Goals Patient Stated Goal: none stated OT Goal Formulation: With patient Time For Goal Achievement: 11/16/22 Potential to Achieve Goals:  Good ADL Goals Pt Will Perform Upper Body Dressing: with supervision;sitting Pt Will Perform Lower Body Dressing: with supervision;sitting/lateral leans;sit to/from stand Pt Will Transfer to Toilet: with supervision;ambulating;regular height toilet Additional ADL Goal #1: pt will perform bed mobility with supervision in prep for ADLs  OT Frequency: Min 1X/week    Co-evaluation PT/OT/SLP Co-Evaluation/Treatment: Yes Reason for Co-Treatment: To address functional/ADL transfers;For patient/therapist safety PT goals addressed during session: Mobility/safety with mobility;Balance OT goals addressed during session: ADL's and self-care      AM-PAC OT "6 Clicks" Daily Activity     Outcome Measure Help from another person eating meals?: None Help from another person taking care of personal grooming?: A Little Help from another person toileting, which includes using toliet, bedpan, or urinal?: A Little Help from another person bathing (including washing, rinsing, drying)?: A Lot Help from another person to put on and taking off regular upper body clothing?: A Little Help from another person to put on and taking off regular lower body clothing?: A Lot 6 Click Score: 17   End of Session Equipment Utilized During Treatment: Gait belt;Other (comment);Oxygen (eva walker) Nurse Communication: Mobility status  Activity Tolerance: Patient tolerated treatment well Patient left: in chair;with call bell/phone within reach  OT Visit Diagnosis: Unsteadiness on feet (R26.81);Other abnormalities of gait and mobility (R26.89);Muscle weakness (generalized) (M62.81)                Time: 1610-9604 OT Time Calculation (min): 34 min Charges:  OT General Charges $OT Visit: 1 Visit OT Evaluation $OT  Eval Moderate Complexity: 1 Mod  Martise Waddell K, OTD, OTR/L SecureChat Preferred Acute Rehab [phone]  Dalphine Handing 11/02/2022, 12:13 PM

## 2022-11-02 NOTE — Discharge Summary (Signed)
[address]       [place] [address]             [phone]    Physician Discharge Summary  Patient ID: Brett Marshall MRN: [id number] DOB/AGE: May 31, 1965 57 y.o.  Admit date: 10/30/2022 Discharge date: 11/05/2022  Admission Diagnoses:  Patient Active Problem List   Diagnosis Date Noted   S/P AVR (aortic valve replacement) 10/30/2022   Endotracheal tube present 10/30/2022   Difficult airway for intubation 10/30/2022   Severe aortic stenosis 10/30/2022   Aortic valve stenosis 10/19/2022   Mild intermittent asthma without complication 09/14/2022   Angina pectoris (HCC) 09/08/2022   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   Chronic diastolic CHF (congestive heart failure) (HCC) 09/07/2022   Abdominal pain 09/07/2022   HLD (hyperlipidemia) 09/07/2022   CAD (coronary artery disease) 09/07/2022   Depression 09/07/2022   Dark stools 09/07/2022   AKI (acute kidney injury) (HCC) 09/17/2021   Obstructive hypertrophic cardiomyopathy (HCC) 09/17/2021   Coronary artery disease involving native coronary artery of native heart with unstable angina pectoris (HCC)    Spontaneous hematoma of forearm    Unstable angina (HCC)    Chest pain 09/05/2021   Controlled type 2 diabetes mellitus without complication, without long-term current use of insulin (HCC) 09/05/2021   Dyspnea 10/21/2020   Hypercholesterolemia 06/05/2019   Encounter for screening colonoscopy    Polyp of sigmoid colon    Gastroesophageal reflux disease without esophagitis 04/10/2018   OSA on CPAP 04/10/2018   Heart failure with mildly reduced ejection fraction (HFmrEF) (HCC) 12/09/2017   Pulmonary hypertension (HCC) 12/09/2017   NASH (nonalcoholic steatohepatitis) 11/07/2017   Essential hypertension 09/13/2016   DISH (diffuse idiopathic skeletal hyperostosis) 05/17/2016   DDD (degenerative disc disease), lumbar 05/17/2016   Type 2 diabetes mellitus without complication, without  long-term current use of insulin (HCC) 02/08/2016   Gynecomastia 02/08/2016   Morbid obesity (HCC) 02/08/2016     Discharge Diagnoses:  Patient Active Problem List   Diagnosis Date Noted   S/P AVR (aortic valve replacement) 10/30/2022   Endotracheal tube present 10/30/2022   Difficult airway for intubation 10/30/2022   Severe aortic stenosis 10/30/2022   Aortic valve stenosis 10/19/2022   Mild intermittent asthma without complication 09/14/2022   Angina pectoris (HCC) 09/08/2022   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   Chronic diastolic CHF (congestive heart failure) (HCC) 09/07/2022   Abdominal pain 09/07/2022   HLD (hyperlipidemia) 09/07/2022   CAD (coronary artery disease) 09/07/2022   Depression 09/07/2022   Dark stools 09/07/2022   AKI (acute kidney injury) (HCC) 09/17/2021   Obstructive hypertrophic cardiomyopathy (HCC) 09/17/2021   Coronary artery disease involving native coronary artery of native heart with unstable angina pectoris (HCC)    Spontaneous hematoma of forearm    Unstable angina (HCC)    Chest pain 09/05/2021   Controlled type 2 diabetes mellitus without complication, without long-term current use of insulin (HCC) 09/05/2021   Dyspnea 10/21/2020   Hypercholesterolemia 06/05/2019   Encounter for screening colonoscopy    Polyp of sigmoid colon    Gastroesophageal reflux disease without esophagitis 04/10/2018   OSA on CPAP 04/10/2018   Heart failure with mildly reduced ejection fraction (HFmrEF) (HCC) 12/09/2017   Pulmonary hypertension (HCC) 12/09/2017   NASH (nonalcoholic steatohepatitis) 11/07/2017   Essential hypertension 09/13/2016   DISH (diffuse idiopathic skeletal hyperostosis) 05/17/2016   DDD (degenerative disc disease), lumbar 05/17/2016   Type 2 diabetes mellitus without complication, without  long-term current use of insulin (HCC) 02/08/2016   Gynecomastia 02/08/2016   Morbid obesity (HCC) 02/08/2016     Discharged  Condition: Stable  History of Present Illness:  Brett Marshall is a 35 you male with history of CAD and possible LVOT obstruction.  He had a stent to the proximal LAD in 2023.  He initially did well but returned with complaints of chest pain.  Cardiac catheterization was performed and showed a patent LAD stent.  He was started on a beta blocker.  He continues to have daily chest pain with radiation into his neck and left shoulder.  This occurs at rest and with exertion.  He also experienced shortness of breath with rest.  Echocardiogram showed non calcified mild Aortic Stenosis.  TAVR CTA showed no evidence of LVOT obstruction.  Discussion in the multidisciplinary valve meeting felt the patient's symptoms were due to his bicuspid Aortic Valve.  It was felt he should undergo surgical replacement.  He was evaluated by Dr. Leafy Ro who felt additional testing was required to determine the actual gradient across his valve.  If indicated he would require valve replacement.  The risks and benefits of the procedure were explained to the patient and he was agreeable to proceed.  Hospital Course:  Mousa Hauber presented to [facility] on 10/30/2022.  She was taken to the operating room and underwent Aortic Valve Replacement with Aortic Enlargement., with a 23 mm Edwards Resilia Bioprosthetic Valve, Resection of Sub Aortic Membrane, and Septal Myomectomy.  He tolerated the procedure and was taken to the SICU in stable condition.  Postoperative hospital course:  The patient was extubated using standard post cardiac surgical protocols without difficulty.  He has maintained stable hemodynamics initially requiring Neo-Synephrine which was weaned without difficulty.  He was started on low-dose beta-blocker and on postop day 1 Swan and arterial line were discontinued.  He is noted to have a history of poorly controlled diabetes with a hemoglobin A1c of 10.0.  He was treated initially with routine insulin drip with  transition to Levemir and sliding scale.  He was on Metformin, Glipizide, and Jardiance preoperatively.  He is noted to have an expected postoperative volume overload and has been started on a course of diuretics.  Renal function has remained normal.  Pacing wires and foley catheter were removed on postop day 2.  Chest tubes have also been removed.  He had expected post op blood loss anemia and last H and H was 7.2 and 22.6 and so he was started on [employer]. He had thrombocytopenia post op but this later resolved as platelet count was increased to 171,000 on 06/18. He was also kept in the unit for assistance with ambulation and closer monitoring then can be done on the floor due to his super morbid obesity. PT was ordered to assist in ambulation. He was felt stable for transfer from the ICU to 4E for further convalescence on 06/17. He has a history of OSA and was on CPAP at night. He was requiring a few liters of oxygen via [place] but was later weaned to room air. Sternal wound is clean, dry, healing without signs of infection. He has been tolerating a diet and has had a bowel movement. He was starting to have hypoglycemia at night on 06/18 so Insulin was decreased. Per diabetes coordinator recommendations, will restart Jardiance and Metformin at discharge.;in addition, will start Insulin pen at discharge. Inpatient diabetes team did provide him with further instructions of Insulin pen use.  He will need close medical follow up after discharge.  Lidocaine patch was added to help with pain control. He remained in sinus rhythm and BP improved (was labile initially) so he was continued on Lopressor 25 mg bid. He was deconditioned and he slowly progressed with cardiac rehab. As discussed with Dr. Leafy Ro, he is felt stable for discharge today.    Consults: pulmonary/intensive care  Significant Diagnostic Studies:  CLINICAL DATA:  Status post aortic valve replacement   EXAM: PORTABLE CHEST 1 VIEW    COMPARISON:  Prior chest x-ray 10/31/2022   FINDINGS: Interval removal of Swan-Ganz catheter. Right IJ vascular sheath remains in place. Stable cardiomegaly with widening of the mediastinal contours. Patient is status post median sternotomy for aortic valve replacement. Bilateral layering pleural effusions and associated bibasilar atelectasis. No pneumothorax. No overt pulmonary edema. Overall, the lungs appear very similar compared to yesterday. No acute osseous abnormality.   IMPRESSION: 1. Interval removal of Swan-Ganz catheter. Right IJ vascular sheath remains in place. 2. Low inspiratory volumes with bilateral layering pleural effusions and associated atelectasis.     Electronically Signed   By: Malachy Moan M.D.   On: 11/01/2022 09:45  Treatments: surgery:  Aortic root enlarging procedure with a dacron patch and Aortic valve replacement with a 23mm Inspiris Pericardial Valve (SN [id number])                      Resection of subaortic membrane                       Septal Myectomy by Dr. Leafy Ro on 10/30/2022.   Discharge Exam: Blood pressure 116/74, pulse 80, temperature 98.2 F (36.8 C), temperature source Oral, resp. rate 18, height 5\' 8"  (1.727 m), weight (!) 150 kg, SpO2 95 %.  Cardiovascular: RRR Pulmonary: Diminished breath sounds throughout Abdomen: Soft, obese, non tender, bowel sounds present. Extremities: Bilateral lower extremity edema. Wound: Clean and dry.  No erythema or signs of infection  Discharge Medications:  The patient has been discharged on:   1.Beta Blocker:  Yes [ x  ]                              No   [   ]                              If No, reason:  2.Ace Inhibitor/ARB: Yes [   ]                                     No  [  x  ]                                     If No, reason:Labile BP  3.Statin:   Yes [  x ]                  No  [   ]                  If No, reason:  4.Ecasa:  Yes  [  x ]  No   [   ]                   If No, reason:  Patient had ACS upon admission:No  Plavix/P2Y12 inhibitor: Yes [   ]                                      No  [  x ]      Allergies as of 11/05/2022   No Known Allergies      Medication List     STOP taking these medications    diltiazem 30 MG tablet Commonly known as: CARDIZEM   glipiZIDE 5 MG tablet Commonly known as: GLUCOTROL   isosorbide mononitrate 30 MG 24 hr tablet Commonly known as: IMDUR       TAKE these medications    acetaminophen 325 MG tablet Commonly known as: TYLENOL Take 650 mg by mouth every 6 (six) hours as needed for moderate pain.   albuterol 108 (90 Base) MCG/ACT inhaler Commonly known as: VENTOLIN HFA Inhale 1-2 puffs into the lungs every 6 (six) hours as needed for wheezing or shortness of breath.   aspirin EC 325 MG tablet Take 1 tablet (325 mg total) by mouth daily. What changed:  medication strength how much to take additional instructions   atorvastatin 40 MG tablet Commonly known as: LIPITOR Take 1 tablet (40 mg total) by mouth daily at 6 PM. What changed: when to take this   cyclobenzaprine 10 MG tablet Commonly known as: FLEXERIL Take 1 tablet (10 mg total) by mouth 3 (three) times daily as needed.For spasms What changed:  how much to take when to take this   DULoxetine 60 MG capsule Commonly known as: CYMBALTA Take 120 mg by mouth at bedtime.   famotidine 40 MG tablet Commonly known as: PEPCID Take 40 mg by mouth at bedtime.   ferrous sulfate 325 (65 FE) MG EC tablet Take 1 tablet (325 mg total) by mouth daily with breakfast.   fluticasone 50 MCG/ACT nasal spray Commonly known as: FLONASE Place 1 spray into both nostrils daily as needed for allergies or rhinitis.   fluticasone-salmeterol 250-50 MCG/ACT Aepb Commonly known as: ADVAIR Inhale 1 puff into the lungs every 12 (twelve) hours.   furosemide 40 MG tablet Commonly known as: LASIX Take 1 tablet (40 mg total) by mouth daily.  For 10 days then stop. What changed:  medication strength how much to take when to take this reasons to take this additional instructions   gabapentin 300 MG capsule Commonly known as: NEURONTIN Take 1 capsule by mouth at bedtime.   glucose blood test strip Commonly known as: GE100 Blood Glucose Test Use as instructed   guaiFENesin 600 MG 12 hr tablet Commonly known as: MUCINEX Take 1 tablet (600 mg total) by mouth 2 (two) times daily as needed.   insulin glargine 100 unit/mL Sopn Commonly known as: LANTUS Inject 30 Units into the skin daily.   Jardiance 25 MG Tabs tablet Generic drug: empagliflozin Take 25 mg by mouth daily.   lidocaine 5 % Commonly known as: LIDODERM Place 1 patch onto the skin daily. Remove & Discard patch within 12 hours or as directed by MD. May place on right or left side of chest   metFORMIN 1000 MG tablet Commonly known as: GLUCOPHAGE Take 1 tablet (1,000 mg total) by mouth 2 (two) times daily with  a meal.   metoprolol tartrate 25 MG tablet Commonly known as: LOPRESSOR Take 1 tablet (25 mg total) by mouth 2 (two) times daily. What changed:  medication strength how much to take   onetouch ultrasoft lancets Check sugar twice daily   oxyCODONE 5 MG immediate release tablet Commonly known as: Oxy IR/ROXICODONE Take 1 tablet (5 mg total) by mouth every 6 (six) hours as needed for severe pain.   potassium chloride SA 20 MEQ tablet Commonly known as: KLOR-CON M Take 1 tablet (20 mEq total) by mouth daily. For 10 days then stop.   traZODone 50 MG tablet Commonly known as: DESYREL Take 50 mg by mouth at bedtime.               Durable Medical Equipment  (From admission, onward)           Start     Ordered   11/03/22 0908  For home use only DME 3 n 1  Once        11/03/22 0907   11/03/22 0907  For home use only DME Bedside commode  Once       Question:  Patient needs a bedside commode to treat with the following condition   Answer:  Physical deconditioning   11/03/22 0907   11/02/22 1628  For home use only DME 4 wheeled rolling walker with seat  Once       Comments: Bariatric, weight 333 lbs  Question Answer Comment  Patient needs a walker to treat with the following condition Physical deconditioning   Patient needs a walker to treat with the following condition S/P AVR (aortic valve replacement)      11/02/22 1628            Follow-up Information     [facility] [place]. Go on 11/11/2022.   Specialty: Cardiothoracic Surgery Why: Appointment time is at 3:00 pm Contact information: [address] [place], [address] [place] [address] [phone]        [facility]. Go on 11/11/2022.   Why: Please arrive by 1:30 pm in order to have a PA/LAT CXR taken PRIOR to office appointment with [facility] PA Contact information: [address] [place] [address]        Creig Hines, NP. Go on 11/26/2022.   Specialties: Cardiology, Radiology Why: Appointment time is at 8:25 am Contact information: [address] [place] [address] [phone]         [facility]. Go on 12/11/2022.   Why: Appointment time is at 10:30 am Contact information: [address], [place], [place] [address] [phone]        [facility] Follow up.   Why: Iantha Fallen) HHPT/OT arranged- Home Health agency will call to arrange appt Contact information: [address] [place] [address] [phone]         Ethelda Chick, MD Follow up.   Specialty: Family Medicine Why: call to arrange follow up in 1-2 weeks Contact information: [address] [place] [address] [phone]         [facility] Follow up.   Why: rollator and bedside commode arranged- delivered to room Contact information: [address], [place], [place] [address]  Phone: [phone]                 Signed:  Ardelle Balls, PA-C 11/05/2022, 10:27 AM

## 2022-11-02 NOTE — Plan of Care (Signed)
  Problem: Health Behavior/Discharge Planning: Goal: Ability to manage health-related needs will improve Outcome: Progressing   

## 2022-11-02 NOTE — Discharge Instructions (Signed)

## 2022-11-03 ENCOUNTER — Inpatient Hospital Stay (HOSPITAL_COMMUNITY): Payer: BLUE CROSS/BLUE SHIELD

## 2022-11-03 LAB — BPAM RBC
Blood Product Expiration Date: 202407022359
Unit Type and Rh: 6200

## 2022-11-03 LAB — TYPE AND SCREEN
ABO/RH(D): A POS
Antibody Screen: NEGATIVE
Unit division: 0

## 2022-11-03 LAB — CBC
HCT: 22.6 % — ABNORMAL LOW (ref 39.0–52.0)
Hemoglobin: 7.2 g/dL — ABNORMAL LOW (ref 13.0–17.0)
MCH: 27.7 pg (ref 26.0–34.0)
MCHC: 31.9 g/dL (ref 30.0–36.0)
MCV: 86.9 fL (ref 80.0–100.0)
Platelets: 171 10*3/uL (ref 150–400)
RBC: 2.6 MIL/uL — ABNORMAL LOW (ref 4.22–5.81)
RDW: 15 % (ref 11.5–15.5)
WBC: 12.6 10*3/uL — ABNORMAL HIGH (ref 4.0–10.5)
nRBC: 0.4 % — ABNORMAL HIGH (ref 0.0–0.2)

## 2022-11-03 LAB — BASIC METABOLIC PANEL
Anion gap: 8 (ref 5–15)
BUN: 21 mg/dL — ABNORMAL HIGH (ref 6–20)
CO2: 24 mmol/L (ref 22–32)
Calcium: 8 mg/dL — ABNORMAL LOW (ref 8.9–10.3)
Chloride: 101 mmol/L (ref 98–111)
Creatinine, Ser: 1.38 mg/dL — ABNORMAL HIGH (ref 0.61–1.24)
GFR, Estimated: 60 mL/min — ABNORMAL LOW (ref >60)
Glucose, Bld: 100 mg/dL — ABNORMAL HIGH (ref 70–99)
Potassium: 4.2 mmol/L (ref 3.5–5.1)
Sodium: 133 mmol/L — ABNORMAL LOW (ref 135–145)

## 2022-11-03 LAB — GLUCOSE, CAPILLARY
Glucose-Capillary: 65 mg/dL — ABNORMAL LOW (ref 70–99)
Glucose-Capillary: 245 mg/dL — ABNORMAL HIGH (ref 70–99)
Glucose-Capillary: 176 mg/dL — ABNORMAL HIGH (ref 70–99)
Glucose-Capillary: 185 mg/dL — ABNORMAL HIGH (ref 70–99)
Glucose-Capillary: 116 mg/dL — ABNORMAL HIGH (ref 70–99)

## 2022-11-03 MED ORDER — LIDOCAINE 5 % EX PTCH
1.0000 | MEDICATED_PATCH | Freq: Every day | CUTANEOUS | Status: DC
  Administered 2022-11-03 – 2022-11-04 (×2): 1 via TRANSDERMAL
  Filled 2022-11-03 (×3): qty 1

## 2022-11-03 MED ORDER — FUROSEMIDE 40 MG PO TABS
40.0000 mg | ORAL_TABLET | Freq: Every day | ORAL | Status: DC
  Administered 2022-11-03 – 2022-11-05 (×3): 40 mg via ORAL
  Filled 2022-11-03 (×3): qty 1

## 2022-11-03 MED ORDER — GUAIFENESIN ER 600 MG PO TB12
600.0000 mg | ORAL_TABLET | Freq: Two times a day (BID) | ORAL | Status: DC
  Administered 2022-11-03 – 2022-11-05 (×5): 600 mg via ORAL
  Filled 2022-11-03 (×5): qty 1

## 2022-11-03 MED ORDER — POTASSIUM CHLORIDE CRYS ER 20 MEQ PO TBCR
20.0000 meq | EXTENDED_RELEASE_TABLET | Freq: Every day | ORAL | Status: DC
  Administered 2022-11-03 – 2022-11-05 (×3): 20 meq via ORAL
  Filled 2022-11-03 (×3): qty 1

## 2022-11-03 MED ORDER — GLIPIZIDE 5 MG PO TABS
5.0000 mg | ORAL_TABLET | Freq: Every day | ORAL | Status: DC
  Filled 2022-11-03: qty 1

## 2022-11-03 MED ORDER — INSULIN DETEMIR 100 UNIT/ML ~~LOC~~ SOLN
30.0000 [IU] | Freq: Every day | SUBCUTANEOUS | Status: DC
  Administered 2022-11-03 – 2022-11-05 (×3): 30 [IU] via SUBCUTANEOUS
  Filled 2022-11-03 (×3): qty 0.3

## 2022-11-03 MED FILL — Lidocaine HCl Local Preservative Free (PF) Inj 2%: INTRAMUSCULAR | Qty: 14 | Status: AC

## 2022-11-03 MED FILL — Potassium Chloride Inj 2 mEq/ML: INTRAVENOUS | Qty: 20 | Status: AC

## 2022-11-03 MED FILL — Mannitol IV Soln 20%: INTRAVENOUS | Qty: 500 | Status: AC

## 2022-11-03 MED FILL — Sodium Chloride IV Soln 0.9%: INTRAVENOUS | Qty: 3000 | Status: AC

## 2022-11-03 MED FILL — Sodium Bicarbonate IV Soln 8.4%: INTRAVENOUS | Qty: 50 | Status: AC

## 2022-11-03 MED FILL — Electrolyte-R (PH 7.4) Solution: INTRAVENOUS | Qty: 6000 | Status: AC

## 2022-11-03 MED FILL — Heparin Sodium (Porcine) Inj 1000 Unit/ML: Qty: 1000 | Status: AC

## 2022-11-03 MED FILL — Potassium Chloride Inj 2 mEq/ML: INTRAVENOUS | Qty: 40 | Status: AC

## 2022-11-03 MED FILL — Albumin, Human Inj 5%: INTRAVENOUS | Qty: 250 | Status: AC

## 2022-11-03 MED FILL — Heparin Sodium (Porcine) Inj 1000 Unit/ML: INTRAMUSCULAR | Qty: 20 | Status: AC

## 2022-11-03 NOTE — Progress Notes (Signed)
CARDIAC REHAB PHASE I   PRE:  Rate/Rhythm: 93 SR  BP:  Supine:   Sitting: 142/88 (radial)  Standing:    SaO2: 100% RA  MODE:  Ambulation: 120 ft   POST:  Rate/Rhythm: 120 ST  BP:  Supine:   Sitting: 149/82 (radial)  Standing:    SaO2: 92% RA  increased to 99% with rest  830-909 Patient very receptive to ambulation. Tolerated fair with gait belt and pushing rolling walker. Slow, steady gait with two standing rest breaks taken. Patient c/o feeling dizzy and short of breath. Rate of perceived dyspnea 3, moderate shortness of breath, SaO2 92% RA upon returned to room, recovered to 99% RA with rest. Symptoms resolved with rest. To bed after ambulation with call bell within reach. Patient using pillow to cough, encourage incentive spirometer use.   Artist Pais, MS, ACSM CEP

## 2022-11-03 NOTE — Progress Notes (Addendum)
      [address]       [place] [address]             [phone]        4 Days Post-Op Procedure(s) (LRB): AORTIC VALVE REPLACEMENT (AVR) USING INSPIRIS RESILIA AORTIC VALVE (N/A) TRANSESOPHAGEAL ECHOCARDIOGRAM (N/A) AORTIC ROOT ENLARGEMENT AND SUBAORTIC MYOMECTOMY (N/A)  Subjective: Patient having some trouble expectorating sputum. He has "rib pain" on right and pain on left lateral to sternum. He had 2 bowel movements and now becoming loose.  Objective: Vital signs in last 24 hours: Temp:  [97.5 F (36.4 C)-98.4 F (36.9 C)] 97.5 F (36.4 C) (06/18 0418) Pulse Rate:  [82-117] 82 (06/18 0418) Cardiac Rhythm: Normal sinus rhythm (06/17 2300) Resp:  [0-30] 18 (06/18 0418) BP: (78-115)/(46-77) 90/77 (06/18 0418) SpO2:  [79 %-100 %] 96 % (06/18 0418) Weight:  [149.5 kg] 149.5 kg (06/18 0418)  Pre op weight 143.3 kg Current Weight  11/03/22 (!) 149.5 kg      Intake/Output from previous day: 06/17 0701 - 06/18 0700 In: 201.4 [I.V.:201.4] Out: -    Physical Exam:  Cardiovascular: Tachycardic Pulmonary: Diminished breath sounds throughout Abdomen: Soft, obese, non tender, bowel sounds present. Extremities: Bilateral lower extremity edema. Wound: Clean and dry.  No erythema or signs of infection.  Lab Results: CBC: Recent Labs    11/02/22 0413 11/03/22 0111  WBC 10.9* 12.6*  HGB 7.6* 7.2*  HCT 24.0* 22.6*  PLT 117* 171   BMET:  Recent Labs    11/02/22 0413 11/03/22 0111  NA 133* 133*  K 4.2 4.2  CL 101 101  CO2 24 24  GLUCOSE 105* 100*  BUN 17 21*  CREATININE 1.30* 1.38*  CALCIUM 7.8* 8.0*    PT/INR:  Lab Results  Component Value Date   INR 1.6 (H) 10/30/2022   INR 1.1 10/28/2022   INR 1.1 09/07/2022   ABG:  INR: Will add last result for INR, ABG once components are confirmed Will add last 4 CBG results once components are confirmed  Assessment/Plan: CV-SR. On Lopressor 25 mg bid. Unable to titrate Lopressor  this am secondary to BP Pulmonary-History of OSA (uses CPAP at night). On room air this am. PA/LAT CXR this am show small left pleural effusion, trace right pleural effusion, patchy airspace disease. Start Mucinex bid.Encourage incentive spirometer. Volume overload-will give Lasix 40 mg PO this am Expected post op blood loss anemia-H and H this am slightly decreased to 7.2 and 22.6 Continue Trigels Forte. 5. DM-CBGs 161/119/65. Pre op HGA1C 10. On scheduled and PRN Insulin but will decrease scheduled to avoid further hypoglycemia. Reviewed diabetes coordinator recommendations. Will restart Jardiance at discharge and will start Insulin pen. He will need close medical follow up after discharge. 6. Thrombocytopenia resolved-platelets this am up to 171,000 7. Creatinine this am 1.38 8. Mild hyponatremia-sodium this am remains 133 9. Stop stool softeners 10. Regarding pain control, Oxy or Ultram PRN and on Neurontin 300 mg at hs. Will add Lidocaine patch, as discussed with patient.   Lasheka Kempner M ZimmermanPA-C 7:06 AM

## 2022-11-03 NOTE — Progress Notes (Signed)
   11/02/22 2352  BiPAP/CPAP/SIPAP  BiPAP/CPAP/SIPAP Pt Type Adult  BiPAP/CPAP/SIPAP Resmed  Mask Type Full face mask  Mask Size Medium  Patient Home Equipment No  Auto Titrate Yes  BiPAP/CPAP /SiPAP Vitals  Resp (!) 29  MEWS Score/Color  MEWS Score 3  MEWS Score Color Yellow

## 2022-11-03 NOTE — Progress Notes (Signed)
   11/03/22 2301  BiPAP/CPAP/SIPAP  BiPAP/CPAP/SIPAP Pt Type Adult  BiPAP/CPAP/SIPAP Resmed  Mask Type Full face mask  Mask Size Medium  Pressure Support 2 cmH20  PEEP  (vpap 2-6)  FiO2 (%) 21 %

## 2022-11-04 LAB — GLUCOSE, CAPILLARY
Glucose-Capillary: 152 mg/dL — ABNORMAL HIGH (ref 70–99)
Glucose-Capillary: 158 mg/dL — ABNORMAL HIGH (ref 70–99)
Glucose-Capillary: 192 mg/dL — ABNORMAL HIGH (ref 70–99)
Glucose-Capillary: 196 mg/dL — ABNORMAL HIGH (ref 70–99)

## 2022-11-04 MED ORDER — ALBUTEROL SULFATE (2.5 MG/3ML) 0.083% IN NEBU: 2.5000 mg | INHALATION_SOLUTION | Freq: Four times a day (QID) | RESPIRATORY_TRACT | Status: DC | PRN

## 2022-11-04 MED ORDER — ALBUTEROL SULFATE (2.5 MG/3ML) 0.083% IN NEBU
2.5000 mg | INHALATION_SOLUTION | Freq: Four times a day (QID) | RESPIRATORY_TRACT | Status: AC
  Administered 2022-11-04 (×3): 2.5 mg via RESPIRATORY_TRACT
  Filled 2022-11-04 (×3): qty 3

## 2022-11-04 MED ORDER — ALBUTEROL SULFATE (2.5 MG/3ML) 0.083% IN NEBU: 2.5000 mg | INHALATION_SOLUTION | Freq: Four times a day (QID) | RESPIRATORY_TRACT | Status: DC

## 2022-11-04 NOTE — Progress Notes (Signed)
   11/04/22 2230  BiPAP/CPAP/SIPAP  BiPAP/CPAP/SIPAP Pt Type Adult  BiPAP/CPAP/SIPAP Resmed  Mask Type Full face mask  Mask Size Medium  Respiratory Rate 29 breaths/min  EPAP  (min 5 max 20)  FiO2 (%) 21 %  Patient Home Equipment No  Auto Titrate Yes

## 2022-11-04 NOTE — Progress Notes (Signed)
      [address]       [place] [address]             [phone]        5 Days Post-Op Procedure(s) (LRB): AORTIC VALVE REPLACEMENT (AVR) USING INSPIRIS RESILIA AORTIC VALVE (N/A) TRANSESOPHAGEAL ECHOCARDIOGRAM (N/A) AORTIC ROOT ENLARGEMENT AND SUBAORTIC MYOMECTOMY (N/A)  Subjective: Patient walked last last night and on the way back, felt weak short of breath.  Objective: Vital signs in last 24 hours: Temp:  [97.2 F (36.2 C)-98 F (36.7 C)] 97.8 F (36.6 C) (06/19 0353) Pulse Rate:  [78-97] 78 (06/19 0414) Cardiac Rhythm: Normal sinus rhythm;Bundle branch block (06/18 1953) Resp:  [16-21] 20 (06/19 0400) BP: (100-149)/(66-82) 101/70 (06/19 0353) SpO2:  [92 %-100 %] 96 % (06/19 0353) FiO2 (%):  [21 %] 21 % (06/18 2301) Weight:  [149.4 kg] 149.4 kg (06/19 0414)  Pre op weight 143.3 kg Current Weight  11/04/22 (!) 149.4 kg      Intake/Output from previous day: No intake/output data recorded.   Physical Exam:  Cardiovascular: RRR Pulmonary: Diminished breath sounds throughout Abdomen: Soft, obese, non tender, bowel sounds present. Extremities: Bilateral lower extremity edema. Wound: Clean and dry.  No erythema or signs of infection.  Lab Results: CBC: Recent Labs    11/02/22 0413 11/03/22 0111  WBC 10.9* 12.6*  HGB 7.6* 7.2*  HCT 24.0* 22.6*  PLT 117* 171    BMET:  Recent Labs    11/02/22 0413 11/03/22 0111  NA 133* 133*  K 4.2 4.2  CL 101 101  CO2 24 24  GLUCOSE 105* 100*  BUN 17 21*  CREATININE 1.30* 1.38*  CALCIUM 7.8* 8.0*     PT/INR:  Lab Results  Component Value Date   INR 1.6 (H) 10/30/2022   INR 1.1 10/28/2022   INR 1.1 09/07/2022   ABG:  INR: Will add last result for INR, ABG once components are confirmed Will add last 4 CBG results once components are confirmed  Assessment/Plan: CV-SR. On Lopressor 25 mg bid. Unable to titrate Lopressor secondary to BP Pulmonary-History of OSA (uses CPAP at  night). On room air this am. On Albuterol PRN and Mucinex 600 mg bid. Encourage incentive spirometer. Volume overload-will give Lasix 40 mg PO daily Expected post op blood loss anemia-H and H this am slightly decreased to 7.2 and 22.6 Continue Trigels Forte. 5. DM-CBGs [id number]. Pre op HGA1C 10. On scheduled and PRN Insulin. Reviewed diabetes coordinator recommendations. Will restart Jardiance at discharge and will start Insulin pen. He will need close medical follow up after discharge. 6. Thrombocytopenia resolved-platelets this am up to 171,000 7. Deconditioned--still fairly weak with ambulation. He has complaints of SOB and dizziness with ambulation.   Josue Falconi M ZimmermanPA-C 6:54 AM

## 2022-11-04 NOTE — Progress Notes (Signed)
Occupational Therapy Treatment Patient Details Name: Brett Marshall MRN: [id number] DOB: Oct 28, 1965 Today's Date: 11/04/2022   History of present illness Pt is 57 year old presented to [facility] on  10/30/22 for AVR due to severe AS. PMH - CAD, Aortic stenosis, DM, cardiomyopathy, morbid obesity, pulmonary HTN, depression   OT comments  Pt progressing towards goals this session, focused on ADL mgmt. Pt able to complete UB/LB dressing with min A, min guard for toilet transfer with use of rollator. Pt min guard-mod A for bed mobility, needs assist for trunk elevation. Reiterated precautions with pt and verbally discussed compensatory strategies and AE for pericare. Pt verbalized understanding. Pt emotional regarding current abilities. Pt presenting with impairments listed below, will follow acutely. Continue to recommend HHOT at d/c.    Recommendations for follow up therapy are one component of a multi-disciplinary discharge planning process, led by the attending physician.  Recommendations may be updated based on patient status, additional functional criteria and insurance authorization.    Assistance Recommended at Discharge Frequent or constant Supervision/Assistance  Patient can return home with the following  A little help with walking and/or transfers;A lot of help with bathing/dressing/bathroom;Assistance with cooking/housework;Direct supervision/assist for medications management;Direct supervision/assist for financial management;Assist for transportation;Help with stairs or ramp for entrance   Equipment Recommendations  BSC/3in1    Recommendations for Other Services PT consult    Precautions / Restrictions Precautions Precautions: Fall;Sternal;Other (comment) Precaution Comments: Watch BP. Check orthostatics. Reviewed sternal precautions with pt Restrictions Weight Bearing Restrictions: Yes RUE Weight Bearing:  (non weight berring) Other Position/Activity Restrictions: sternal prec        Mobility Bed Mobility Overal bed mobility: Needs Assistance Bed Mobility: Sidelying to Sit, Sit to Supine   Sidelying to sit: Mod assist   Sit to supine: Min guard   General bed mobility comments: assist to elevate trunk    Transfers Overall transfer level: Needs assistance Equipment used: Rollator (4 wheels) Transfers: Sit to/from Stand Sit to Stand: Min guard                 Balance Overall balance assessment: Needs assistance Sitting-balance support: Feet supported Sitting balance-Leahy Scale: Good     Standing balance support: Bilateral upper extremity supported, During functional activity Standing balance-Leahy Scale: Poor                             ADL either performed or assessed with clinical judgement   ADL Overall ADL's : Needs assistance/impaired                 Upper Body Dressing : Minimal assistance;Sitting Upper Body Dressing Details (indicate cue type and reason): to pull shirt down in back Lower Body Dressing: Minimal assistance;Sitting/lateral leans Lower Body Dressing Details (indicate cue type and reason): to pull up pants on backside Toilet Transfer: Min guard;Rollator (4 wheels);Ambulation;Regular Teacher, adult education Details (indicate cue type and reason): simulated   Toileting - Clothing Manipulation Details (indicate cue type and reason): discussed compensatory strategies and use of AE (bidet, toilet aid)     Functional mobility during ADLs: Min guard;Rollator (4 wheels)      Extremity/Trunk Assessment Upper Extremity Assessment Upper Extremity Assessment: Generalized weakness (within limits of sternal prec)   Lower Extremity Assessment Lower Extremity Assessment: Defer to PT evaluation        Vision   Vision Assessment?: No apparent visual deficits   Perception Perception Perception: Not tested   Praxis  Praxis Praxis: Not tested    Cognition Arousal/Alertness: Awake/alert Behavior During  Therapy: WFL for tasks assessed/performed Overall Cognitive Status: Within Functional Limits for tasks assessed                                          Exercises      Shoulder Instructions       General Comments VSS on RA    Pertinent Vitals/ Pain       Pain Assessment Pain Assessment: Faces Pain Score: 3  Faces Pain Scale: Hurts little more Pain Location: chest incision Pain Descriptors / Indicators: Grimacing, Guarding Pain Intervention(s): Limited activity within patient's tolerance, Monitored during session, Repositioned  Home Living                                          Prior Functioning/Environment              Frequency  Min 1X/week        Progress Toward Goals  OT Goals(current goals can now be found in the care plan section)  Progress towards OT goals: Progressing toward goals  Acute Rehab OT Goals Patient Stated Goal: none stated OT Goal Formulation: With patient Time For Goal Achievement: 11/16/22 Potential to Achieve Goals: Good ADL Goals Pt Will Perform Upper Body Dressing: with supervision;sitting Pt Will Perform Lower Body Dressing: with supervision;sitting/lateral leans;sit to/from stand Pt Will Transfer to Toilet: with supervision;ambulating;regular height toilet Additional ADL Goal #1: pt will perform bed mobility with supervision in prep for ADLs  Plan Discharge plan remains appropriate;Frequency remains appropriate    Co-evaluation                 AM-PAC OT "6 Clicks" Daily Activity     Outcome Measure   Help from another person eating meals?: None Help from another person taking care of personal grooming?: A Little Help from another person toileting, which includes using toliet, bedpan, or urinal?: A Little Help from another person bathing (including washing, rinsing, drying)?: A Lot Help from another person to put on and taking off regular upper body clothing?: A Little Help from  another person to put on and taking off regular lower body clothing?: A Lot 6 Click Score: 17    End of Session Equipment Utilized During Treatment: Gait belt;Rollator (4 wheels)  OT Visit Diagnosis: Unsteadiness on feet (R26.81);Other abnormalities of gait and mobility (R26.89);Muscle weakness (generalized) (M62.81)   Activity Tolerance Patient tolerated treatment well   Patient Left in chair;with call bell/phone within reach   Nurse Communication Mobility status (pt having heartburn, confirmed pt can have ice water)        Time: 1610-9604 OT Time Calculation (min): 47 min  Charges: OT General Charges $OT Visit: 1 Visit OT Treatments $Self Care/Home Management : 23-37 mins $Therapeutic Activity: 8-22 mins  Carver Fila, OTD, OTR/L SecureChat Preferred Acute Rehab [phone]   Carver Fila Koonce 11/04/2022, 12:23 PM

## 2022-11-04 NOTE — Progress Notes (Signed)
Physical Therapy Treatment Patient Details Name: Brett Marshall MRN: [id number] DOB: 1965/11/07 Today's Date: 11/04/2022   History of Present Illness Pt is 57 year old presented to [facility] on  10/30/22 for AVR due to severe AS. PMH - CAD, Aortic stenosis, DM, cardiomyopathy, morbid obesity, pulmonary HTN, depression    PT Comments    Pt received in supine, agreeable to therapy session with emphasis on sternal precaution instruction (handout given), transfer safety and gait progression with monitoring of VS. Pt BP soft MAP (81) sitting and (70) standing, MD/RN notified pt may benefit from trial of BLE compression to see if this improves hemodynamic stability. Pt also reports hx of vertigo prior to admission but he has not been treated before for this, and may benefit from PT vestibular evaluation next session, possible mild nystagmus with R/upward gaze. Pt agreeable to sit EOB while eating dinner, spouse present in room and pt agreeable to press call bell for assist with return to supine. Pt continues to benefit from PT services to progress toward functional mobility goals.    Recommendations for follow up therapy are one component of a multi-disciplinary discharge planning process, led by the attending physician.  Recommendations may be updated based on patient status, additional functional criteria and insurance authorization.  Follow Up Recommendations       Assistance Recommended at Discharge Frequent or constant Supervision/Assistance  Patient can return home with the following A little help with walking and/or transfers;A lot of help with bathing/dressing/bathroom;Assistance with cooking/housework;Assist for transportation;Help with stairs or ramp for entrance   Equipment Recommendations  Rollator (4 wheels)    Recommendations for Other Services       Precautions / Restrictions Precautions Precautions: Fall;Sternal;Other (comment) Precaution Booklet Issued: Yes (comment) Precaution  Comments: watch BP, pt also reports some hx vertigo Restrictions Weight Bearing Restrictions: Yes Other Position/Activity Restrictions: sternal prec     Mobility  Bed Mobility Overal bed mobility: Needs Assistance Bed Mobility: Sidelying to Sit   Sidelying to sit: Min assist       General bed mobility comments: assist to elevate trunk, cues for Move in the Tube precs, pt states no handout has been given so PTA printed one out and gave to pt to review.    Transfers Overall transfer level: Needs assistance Equipment used: Rollator (4 wheels) Transfers: Sit to/from Stand Sit to Stand: Min guard           General transfer comment: assist to power up with pt keeping his hands on his knees, fairly stable but reports increased sx vertigo/some diplopia with transition to sitting/standing    Ambulation/Gait Ambulation/Gait assistance: Min guard Gait Distance (Feet): 30 Feet Assistive device: Rollator (4 wheels) Gait Pattern/deviations: Step-through pattern, Decreased stride length Gait velocity: decreased     General Gait Details: Pt self-limiting distance due to c/o dizziness, "kind of like I'm on a boat" and some possible diplopia. RN/MD notified, pt may need vestibular PT eval next session, possible mild nystagmus with upward gaze and R eye with lateral gaze. BP MAP (81) sitting and (70) standing.   Stairs             Wheelchair Mobility    Modified Rankin (Stroke Patients Only)       Balance Overall balance assessment: Needs assistance Sitting-balance support: Feet supported Sitting balance-Leahy Scale: Good     Standing balance support: Single extremity supported Standing balance-Leahy Scale: Fair Standing balance comment: static standing during BP with single UE support no LOB, less steady  without BUE support for gait                            Cognition Arousal/Alertness: Awake/alert Behavior During Therapy: WFL for tasks  assessed/performed Overall Cognitive Status: Within Functional Limits for tasks assessed                                 General Comments: spouse present and also receptive to instruction.        Exercises Other Exercises Other Exercises: verbal review for supine/seated BLE HEP and handouts given to reinforce (see Education section), pt performs ankle pumps x10 reps prior to sitting EOB.    General Comments General comments (skin integrity, edema, etc.): BP 99/72 (81) sitting EOB; BP 90/61 (70) standing HR 84 bpm; SpO2 WFL per chart review on RA      Pertinent Vitals/Pain Pain Assessment Pain Assessment: Faces Faces Pain Scale: Hurts little more Pain Location: chest incision Pain Descriptors / Indicators: Guarding Pain Intervention(s): Limited activity within patient's tolerance, Monitored during session, Repositioned    Home Living                          Prior Function            PT Goals (current goals can now be found in the care plan section) Acute Rehab PT Goals Patient Stated Goal: home PT Goal Formulation: With patient Time For Goal Achievement: 11/14/22 Progress towards PT goals: Progressing toward goals    Frequency    Min 1X/week      PT Plan Current plan remains appropriate    Co-evaluation              AM-PAC PT "6 Clicks" Mobility   Outcome Measure  Help needed turning from your back to your side while in a flat bed without using bedrails?: A Little Help needed moving from lying on your back to sitting on the side of a flat bed without using bedrails?: A Lot Help needed moving to and from a bed to a chair (including a wheelchair)?: A Little Help needed standing up from a chair using your arms (e.g., wheelchair or bedside chair)?: A Little Help needed to walk in hospital room?: A Little Help needed climbing 3-5 steps with a railing? : A Lot 6 Click Score: 16    End of Session Equipment Utilized During Treatment:  Gait belt Activity Tolerance: Patient tolerated treatment well;Treatment limited secondary to medical complications (Comment);Other (comment) (sx of vertigo limiting standing/gait tolerance) Patient left: in bed;with call bell/phone within reach;with family/visitor present;Other (comment) (pt sitting EOB to eat dinner, spouse in room with him) Nurse Communication: Mobility status;Other (comment) (may benefit from TED hose, (MAP), vertigo sx) PT Visit Diagnosis: Other abnormalities of gait and mobility (R26.89);Muscle weakness (generalized) (M62.81);Pain     Time: 1610-9604 PT Time Calculation (min) (ACUTE ONLY): 31 min  Charges:  $Therapeutic Exercise: 8-22 mins $Therapeutic Activity: 8-22 mins                     Inell Mimbs P., PTA Acute Rehabilitation Services Secure Chat Preferred 9a-5:30pm Office: [phone]    Dorathy Kinsman [facility] 11/04/2022, 6:33 PM

## 2022-11-05 LAB — GLUCOSE, CAPILLARY
Glucose-Capillary: 128 mg/dL — ABNORMAL HIGH (ref 70–99)
Glucose-Capillary: 173 mg/dL — ABNORMAL HIGH (ref 70–99)

## 2022-11-05 MED ORDER — ASPIRIN 325 MG PO TBEC: 325.0000 mg | DELAYED_RELEASE_TABLET | Freq: Every day | ORAL | Status: DC

## 2022-11-05 MED ORDER — GUAIFENESIN ER 600 MG PO TB12: 600.0000 mg | ORAL_TABLET | Freq: Two times a day (BID) | ORAL | Status: DC | PRN

## 2022-11-05 MED ORDER — OXYCODONE HCL 5 MG PO TABS: 5.0000 mg | ORAL_TABLET | Freq: Four times a day (QID) | ORAL | 0 refills | Status: DC | PRN

## 2022-11-05 MED ORDER — EMPAGLIFLOZIN 10 MG PO TABS
10.0000 mg | ORAL_TABLET | Freq: Every day | ORAL | Status: DC
  Administered 2022-11-05: 10 mg via ORAL
  Filled 2022-11-05: qty 1

## 2022-11-05 MED ORDER — POTASSIUM CHLORIDE CRYS ER 20 MEQ PO TBCR: 20.0000 meq | EXTENDED_RELEASE_TABLET | Freq: Every day | ORAL | 0 refills | Status: DC

## 2022-11-05 MED ORDER — LIDOCAINE 5 % EX PTCH: 1.0000 | MEDICATED_PATCH | Freq: Every day | CUTANEOUS | 0 refills | Status: AC

## 2022-11-05 MED ORDER — METOPROLOL TARTRATE 25 MG PO TABS: 25.0000 mg | ORAL_TABLET | Freq: Two times a day (BID) | ORAL | 1 refills | Status: DC

## 2022-11-05 MED ORDER — FERROUS SULFATE 325 (65 FE) MG PO TBEC: 325.0000 mg | DELAYED_RELEASE_TABLET | Freq: Every day | ORAL | 0 refills | Status: AC

## 2022-11-05 MED ORDER — FUROSEMIDE 40 MG PO TABS: 40.0000 mg | ORAL_TABLET | Freq: Every day | ORAL | 0 refills | Status: DC

## 2022-11-05 MED ORDER — ATORVASTATIN CALCIUM 40 MG PO TABS: 40.0000 mg | ORAL_TABLET | Freq: Every day | ORAL | 2 refills | Status: AC

## 2022-11-05 MED ORDER — ALBUTEROL SULFATE (2.5 MG/3ML) 0.083% IN NEBU
2.5000 mg | INHALATION_SOLUTION | Freq: Once | RESPIRATORY_TRACT | Status: AC
  Administered 2022-11-05: 2.5 mg via RESPIRATORY_TRACT
  Filled 2022-11-05: qty 3

## 2022-11-05 MED ORDER — INSULIN GLARGINE 100 UNITS/ML SOLOSTAR PEN: 30.0000 [IU] | PEN_INJECTOR | Freq: Every day | SUBCUTANEOUS | 11 refills | Status: AC

## 2022-11-05 NOTE — Progress Notes (Signed)
CARDIAC REHAB PHASE I   PRE:  Rate/Rhythm: 82 SR                          SaO2: 96 RA  MODE:  Ambulation: 240 ft   POST:  Rate/Rhythm: 86 SR      SaO2: 95 RA  Pt sitting in chair, feeling well today. Ambulated in hall, using rollator, moving at slow steady pace. Returned to chair with call bell and bedside table in reach. Tolerated well with no pain, SOB and mild dizziness. Dizziness resolves with short standing breaks. Post OHS education including site care, restrictions, risk factors, exercise guidelines, heart healthy diabetic diet, sternal precautions, IS  use at home, home needs at discharge and CRP2 reviewed. All questions and concerns addressed. Will refer to [facility] for CRP2. Plan for discharge later today or tomorrow morning.   [id number]   Woodroe Chen, RN BSN 11/05/2022 11:21 AM

## 2022-11-05 NOTE — Progress Notes (Cosign Needed Addendum)
      [address]       [place] [address]             [phone]        6 Days Post-Op Procedure(s) (LRB): AORTIC VALVE REPLACEMENT (AVR) USING INSPIRIS RESILIA AORTIC VALVE (N/A) TRANSESOPHAGEAL ECHOCARDIOGRAM (N/A) AORTIC ROOT ENLARGEMENT AND SUBAORTIC MYOMECTOMY (N/A)  Subjective: Patient walked 2 times yesterday. He is sitting in chair this am.  Objective: Vital signs in last 24 hours: Temp:  [97.6 F (36.4 C)-98.4 F (36.9 C)] 98 F (36.7 C) (06/20 0621) Pulse Rate:  [74-82] 76 (06/20 0621) Cardiac Rhythm: Normal sinus rhythm (06/20 0347) Resp:  [13-30] 20 (06/20 0621) BP: (92-124)/(57-78) 124/77 (06/20 0621) SpO2:  [94 %-100 %] 96 % (06/20 0621) FiO2 (%):  [21 %] 21 % (06/19 2230) Weight:  [150 kg] 150 kg (06/20 0613)  Pre op weight 143.3 kg Current Weight  11/05/22 (!) 150 kg      Intake/Output from previous day: No intake/output data recorded.   Physical Exam:  Cardiovascular: RRR Pulmonary: Diminished breath sounds throughout Abdomen: Soft, non tender, bowel sounds present. Extremities: Ted hose in place Wound: Clean and dry.  No erythema or signs of infection.  Lab Results: CBC: Recent Labs    11/03/22 0111  WBC 12.6*  HGB 7.2*  HCT 22.6*  PLT 171   BMET:  Recent Labs    11/03/22 0111  NA 133*  K 4.2  CL 101  CO2 24  GLUCOSE 100*  BUN 21*  CREATININE 1.38*  CALCIUM 8.0*    PT/INR:  Lab Results  Component Value Date   INR 1.6 (H) 10/30/2022   INR 1.1 10/28/2022   INR 1.1 09/07/2022   ABG:  INR: Will add last result for INR, ABG once components are confirmed Will add last 4 CBG results once components are confirmed  Assessment/Plan: CV-SR. On Lopressor 25 mg bid.  Pulmonary-History of OSA (uses CPAP at night). On room air this am. On Albuterol PRN and Mucinex 600 mg bid. Encourage incentive spirometer. Volume overload-will give Lasix 40 mg PO daily Expected post op blood loss anemia-H and H this  am slightly decreased to 7.2 and 22.6 Continue Trigels Forte. 5. DM-CBGs 192/196/128. Pre op HGA1C 10. On scheduled and PRN Insulin. Reviewed diabetes coordinator recommendations. Will restart Jardiance and Metformin. Will also start Insulin pen at discharge.  He will need close medical follow up after discharge. Patient requesting further instruction regarding use of Insulin pen. I spoke with inpatient diabetes team, and she will arrange for further instruction today 6. Deconditioned--slowly improving. 7. Remove chest tube sutures 8. Home later today;BSC, 3 in 1, and rolling walker arranged  Roc Streett M ZimmermanPA-C 6:58 AM

## 2022-11-05 NOTE — Inpatient Diabetes Management (Addendum)
Inpatient Diabetes Program Recommendations  AACE/ADA: New Consensus Statement on Inpatient Glycemic Control (2015)  Target Ranges:  Prepandial:   less than 140 mg/dL      Peak postprandial:   less than 180 mg/dL (1-2 hours)      Critically ill patients:  140 - 180 mg/dL   Lab Results  Component Value Date   GLUCAP 128 (H) 11/05/2022   HGBA1C 10.0 (H) 10/28/2022    Review of Glycemic Control  Latest Reference Range & Units 11/04/22 11:18 11/04/22 16:25 11/04/22 21:03 11/05/22 06:10  Glucose-Capillary 70 - 99 mg/dL 086 (H) 578 (H) 469 (H) 128 (H)  (H): Data is abnormally high Diabetes history: Type 2 DM Outpatient Diabetes medications: Jardiance 25 mg every day, Glipizide 5 mg BID, Metformin 1000 mg BID Current orders for Inpatient glycemic control: Levemir 34 units every day, Novolog 0-24 units TID   Inpatient Diabetes Program Recommendations:    Discharge Recommendations: Other recommendations: Metformin 1000 mg BID, Jardiance 10 mg QD Long acting recommendations: Insulin Glargine (LANTUS) Solostar Pen 30 units QD  Supply/Referral recommendations: Glucometer Test strips Lancet device Lancets Pen needles - standard  Will plan to see pt again today.   Addendum: Spoke with patient and wife again to further reinforce DM.  Reviewed patient's current A1c of 10.0%. Explained what a A1c is and what it measures. Also reviewed goal A1c with patient, importance of good glucose control @ home, and blood sugar goals. Reviewed patho of DM, survival skills, interventions, importance of being mindful with CHO intake, differences between long acting vs short acting insulin, current doses, glucose trends, when to call MD, vascular changes and commorbidities.  Patient has a meter and testing supplies and Freestyle libre 3 to apply when he gets home.  Educated patient and spouse on insulin pen use at home. Reviewed contents of insulin flexpen starter kit. Reviewed all steps if insulin pen  including attachment of needle, 2-unit air shot, dialing up dose, giving injection, removing needle, disposal of sharps, storage of unused insulin, disposal of insulin etc. Patient able to provide successful return demonstration. Also reviewed troubleshooting with insulin pen. MD to give patient Rxs for insulin pens and insulin pen needles. No further questions at this time.    Thanks, Lujean Rave, MSN, RNC-OB Diabetes Coordinator [phone] (8a-5p)

## 2022-11-05 NOTE — Plan of Care (Signed)
  Problem: Education: Goal: Understanding of CV disease, CV risk reduction, and recovery process will improve Outcome: Adequate for Discharge Goal: Individualized Educational Video(s) Outcome: Adequate for Discharge   Problem: Activity: Goal: Ability to return to baseline activity level will improve Outcome: Adequate for Discharge   Problem: Cardiovascular: Goal: Ability to achieve and maintain adequate cardiovascular perfusion will improve Outcome: Adequate for Discharge Goal: Vascular access site(s) Level 0-1 will be maintained Outcome: Adequate for Discharge   Problem: Health Behavior/Discharge Planning: Goal: Ability to safely manage health-related needs after discharge will improve Outcome: Adequate for Discharge   Problem: Activity: Goal: Risk for activity intolerance will decrease Outcome: Adequate for Discharge   Problem: Coping: Goal: Level of anxiety will decrease Outcome: Adequate for Discharge   Problem: Elimination: Goal: Will not experience complications related to bowel motility Outcome: Adequate for Discharge Goal: Will not experience complications related to urinary retention Outcome: Adequate for Discharge   Problem: Cardiac: Goal: Will achieve and/or maintain hemodynamic stability Outcome: Adequate for Discharge   Problem: Clinical Measurements: Goal: Postoperative complications will be avoided or minimized Outcome: Adequate for Discharge

## 2022-11-05 NOTE — Progress Notes (Signed)
Physical Therapy Treatment Patient Details Name: Brett Marshall MRN: [id number] DOB: 10/02/1965 Today's Date: 11/05/2022   History of Present Illness Pt is 57 year old presented to [facility] on  10/30/22 for AVR due to severe AS. PMH - CAD, Aortic stenosis, DM, cardiomyopathy, morbid obesity, pulmonary HTN, depression    PT Comments    Vestibular evaluation completed (see details below). 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 Patient provided with home exercises to initiate his treatment, however will benefit from OPPT for vestibular rehab. Patient prefers to go to [facility] as this is closer to his home and call placed to assure they offer vestibular rehab.     Recommendations for follow up therapy are one component of a multi-disciplinary discharge planning process, led by the attending physician.  Recommendations may be updated based on patient status, additional functional criteria and insurance authorization.  Follow Up Recommendations       Assistance Recommended at Discharge Intermittent Supervision/Assistance  Patient can return home with the following A little help with walking and/or transfers;A lot of help with bathing/dressing/bathroom;Assistance with cooking/housework;Assist for transportation;Help with stairs or ramp for entrance   Equipment Recommendations  Rollator (4 wheels)    Recommendations for Other Services       Precautions / Restrictions    11/05/22 0847  Vestibular Assessment  General Observation Sitting upright on arrival  Symptom Behavior  Subjective history of current problem x4-5 years has had spinning sensation that lasts ~5 seconds when he sits up and when he stands up.  Type of Dizziness  Spinning  Frequency of Dizziness daily  Duration of Dizziness 5 sec  Symptom Nature Motion provoked  Aggravating Factors Supine to sit;Sit to stand  Relieving Factors Head stationary  Progression of Symptoms No  change since onset  Oculomotor Exam  Oculomotor Alignment Normal  Ocular ROM WNL  Spontaneous Absent  Gaze-induced  Absent  Smooth Pursuits Intact  Vestibulo-Ocular Reflex  VOR 1 Head Only (x 1 viewing) unable to keep eyes on target with rt head turn  VOR to Slow Head Movement Normal  VOR Cancellation Normal  Positional Testing  Dix-Hallpike Dix-Hallpike Right;Dix-Hallpike Left  Horizontal Canal Testing Horizontal Canal Right;Horizontal Canal Left  Dix-Hallpike Right  Dix-Hallpike Right Duration 0  Dix-Hallpike Right Symptoms No nystagmus  Dix-Hallpike Left  Dix-Hallpike Left Duration 0  Dix-Hallpike Left Symptoms No nystagmus  Horizontal Canal Right  Horizontal Canal Right Duration 0  Horizontal Canal Right Symptoms Normal  Horizontal Canal Left  Horizontal Canal Left Duration 0  Horizontal Canal Left Symptoms Normal  Orthostatics  Orthostatics Comment has had drops in BP this admission, however not enough to qualify as truly orthostatic   Precautions Precautions: Fall;Sternal;Other (comment)     Mobility  Bed Mobility Overal bed mobility: Needs Assistance Bed Mobility: Rolling, Sidelying to Sit, Sit to Sidelying Rolling: Min assist Sidelying to sit: Min guard, HOB elevated     Sit to sidelying: Min assist (for second leg)      Transfers Overall transfer level: Modified independent Equipment used: None Transfers: Sit to/from Stand, Bed to chair/wheelchair/BSC Sit to Stand: Modified independent (Device/Increase time)   Step pivot transfers: Supervision       General transfer comment: chair to bed for vestibular assessment; back to chair at end of session    Ambulation/Gait                   Stairs  Wheelchair Mobility    Modified Rankin (Stroke Patients Only)       Balance   Sitting-balance support: Feet supported Sitting balance-Leahy Scale: Good     Standing balance support: No upper extremity supported Standing  balance-Leahy Scale: Fair                              Cognition Arousal/Alertness: Awake/alert Behavior During Therapy: WFL for tasks assessed/performed Overall Cognitive Status: Within Functional Limits for tasks assessed                                          Exercises   11/05/22 0001  Vestibular Treatment/Exercise  Vestibular Treatment Provided Gaze  Gaze Exercises X1 Viewing Horizontal;X1 Viewing Vertical  X1 Viewing Horizontal  Foot Position seated  Time 10 sec   Reps 3  Comments eyes lose target when turning head rt  X1 Viewing Vertical  Foot Position seated  Time 10 sec  Reps 1  Comments reports A begins to slightly move   Access Code: [email or web address] URL: [email or web address] Date: 11/05/2022 Prepared by: [facility]  Exercises - Seated Gaze Stabilization with Head Rotation  - 10 x daily - 7 x weekly - 4 sets - up to 1 minute each hold - Imaginary Target with Head Nod  - 10 x daily - 7 x weekly - 4 sets - up to 1 minute each hold      General Comments        Pertinent Vitals/Pain Pain Assessment Pain Assessment: No/denies pain    Home Living                          Prior Function            PT Goals (current goals can now be found in the care plan section) Acute Rehab PT Goals Patient Stated Goal: home Time For Goal Achievement: 11/14/22 Potential to Achieve Goals: Good Progress towards PT goals: Progressing toward goals    Frequency           PT Plan Discharge plan needs to be updated    Co-evaluation              AM-PAC PT "6 Clicks" Mobility   Outcome Measure  Help needed turning from your back to your side while in a flat bed without using bedrails?: A Little Help needed moving from lying on your back to sitting on the side of a flat bed without using bedrails?: A Lot Help needed moving to and from a bed to a chair (including a wheelchair)?: A Little Help needed  standing up from a chair using your arms (e.g., wheelchair or bedside chair)?: None Help needed to walk in hospital room?: A Little Help needed climbing 3-5 steps with a railing? : A Little 6 Click Score: 18    End of Session   Activity Tolerance: Patient tolerated treatment well Patient left: in chair;with call bell/phone within reach Nurse Communication: Other (comment) (chat to CM re: new need for vestib  rehab OPPT) PT Visit Diagnosis: Other abnormalities of gait and mobility (R26.89);Muscle weakness (generalized) (M62.81);Pain;Dizziness and giddiness (R42)     Time: 8119-1478 PT Time Calculation (min) (ACUTE ONLY): 40 min  Charges:  $Therapeutic Exercise: 8-22 mins $Therapeutic Activity: 8-22 mins $Physical Performance Test: 8-22  mins                      Jerolyn Center, PT Acute Rehabilitation Services  Office [phone]    Zena Amos 11/05/2022, 8:59 AM

## 2022-11-05 NOTE — Progress Notes (Signed)
Chest tube sutures removed per order. 

## 2022-11-05 NOTE — Consult Note (Signed)
   [facility] CM Inpatient Consult   11/05/2022  [name] April 14, 1966 [id number]  [facility] [[facility]]  Accountable Care Organization [ACO] Patient: Brett Marshall [facility]   [facility] Liaison remote coverage review for patient admitted to [facility]  Primary Care Provider:  Ethelda Chick, MD is with [facility], not currently a [facility] Primary Care Provider   Patient screened for hospitalization with noted high risk score for unplanned readmission risk  Review of patient's electronic medical record reveals patient is which a  specialist.   Plan:  No community care coordination is referred at this time  Of note, [facility] Care Management/Population Health does not replace or interfere with any arrangements made by the Inpatient Transition of Care team.  For questions contact:   Charlesetta Shanks, RN BSN CCM [facility]  [phone] business mobile phone Toll free office [phone]  *Concierge Line  [phone] Fax number: [phone] [email or web address]

## 2022-11-05 NOTE — TOC Transition Note (Signed)
Transition of Care (TOC) - CM/SW Discharge Note Donn Pierini RN, BSN Transitions of Care Unit 4E- RN Case Manager See Treatment Team for direct phone #   Patient Details  Name: Brett Marshall MRN: [id number] Date of Birth: 1966/03/26  Transition of Care [facility]) CM/SW Contact:  Darrold Span, RN Phone Number: 11/05/2022, 11:52 AM   Clinical Narrative:    Pt for transition home today, CM spoke with pt at bedside- [facility] for pt to take home.  Discussed with pt HH and vestibular rehab- pt voiced that he prefers to start with [facility] and transition to outpt PT for vestibular at a later date when he has healed and recovered from surgery.  CM has been  Notified by [employer] liaison -following patient with MD office protocol referral prearranged for [facility] needs- checked with liaison to see if they could offer vestibular PT w/ [facility]- and they can offer this service.  Discussed [facility] referral with patient- and offered choice- pt agreeable to stay with [facility] and would like to try the vestibular PT at home as well. Updated [facility] liaison who will follow up. [facility] to call pt for scheduling.   Pt voiced he has transportation home, is going to speak with provider about his inhaler vs nebulizer- discussed if nebulizer ordered- [employer] could follow up and deliver that as well with an MD order if needed.   No further TOC needs noted    Final next level of care: Home w Home Health Services Barriers to Discharge: Barriers Resolved   Patient Goals and CMS Choice CMS Medicare.gov Compare Post Acute Care list provided to:: Patient Choice offered to / list presented to : Patient  Discharge Placement                 Home w/ [facility]        Discharge Plan and Services Additional resources added to the After Visit Summary for     Discharge Planning Services: CM Consult Post Acute Care Choice: Durable Medical Equipment, Home Health          DME  Arranged: 3-N-1, Walker rolling with seat DME Agency: [employer] Date DME Agency Contacted: 11/02/22 Time DME Agency Contacted: [phone] Representative spoke with at DME Agency: Shaune Leeks HH Arranged: RN, PT [employer] Agency: [facility] Date [facility] Agency Contacted: 11/02/22 Time HH Agency Contacted: 1623 Representative spoke with at [facility] Agency: Amy  Social Determinants of Health (SDOH) Interventions SDOH Screenings   Food Insecurity: No Food Insecurity (11/03/2022)  Housing: Patient Declined (11/03/2022)  Transportation Needs: Patient Declined (11/03/2022)  Utilities: Not At Risk (11/03/2022)  Depression (PHQ2-9): Low Risk  (12/29/2021)  Recent Concern: Depression (PHQ2-9) - Medium Risk (12/08/2021)  Tobacco Use: Low Risk  (10/31/2022)     Readmission Risk Interventions    11/05/2022   11:52 AM  Readmission Risk Prevention Plan  Transportation Screening Complete  PCP or Specialist Appt within 3-5 Days Complete  HRI or Home Care Consult Complete  Social Work Consult for Recovery Care Planning/Counseling Complete  Palliative Care Screening Not Applicable  Medication Review [employer]) Complete

## 2022-11-06 DIAGNOSIS — G4733 Obstructive sleep apnea (adult) (pediatric): Secondary | ICD-10-CM | POA: Diagnosis not present

## 2022-11-09 ENCOUNTER — Encounter (HOSPITAL_COMMUNITY): Payer: Self-pay

## 2022-11-09 ENCOUNTER — Telehealth: Payer: Self-pay | Admitting: Cardiovascular Disease

## 2022-11-09 ENCOUNTER — Ambulatory Visit: Payer: BLUE CROSS/BLUE SHIELD | Admitting: Medical

## 2022-11-09 ENCOUNTER — Other Ambulatory Visit: Payer: Self-pay

## 2022-11-09 ENCOUNTER — Emergency Department: Payer: BLUE CROSS/BLUE SHIELD

## 2022-11-09 ENCOUNTER — Emergency Department
Admission: EM | Admit: 2022-11-09 | Discharge: 2022-11-09 | Disposition: A | Discharge status: Home / Self Care | Payer: BLUE CROSS/BLUE SHIELD | Attending: Emergency Medicine | Admitting: Emergency Medicine

## 2022-11-09 DIAGNOSIS — G8918 Other acute postprocedural pain: Secondary | ICD-10-CM | POA: Diagnosis not present

## 2022-11-09 DIAGNOSIS — E119 Type 2 diabetes mellitus without complications: Secondary | ICD-10-CM | POA: Diagnosis not present

## 2022-11-09 DIAGNOSIS — R531 Weakness: Secondary | ICD-10-CM | POA: Diagnosis not present

## 2022-11-09 DIAGNOSIS — I2510 Atherosclerotic heart disease of native coronary artery without angina pectoris: Secondary | ICD-10-CM | POA: Insufficient documentation

## 2022-11-09 DIAGNOSIS — R69 Illness, unspecified: Secondary | ICD-10-CM | POA: Diagnosis not present

## 2022-11-09 DIAGNOSIS — I517 Cardiomegaly: Secondary | ICD-10-CM | POA: Diagnosis not present

## 2022-11-09 DIAGNOSIS — I110 Hypertensive heart disease with heart failure: Secondary | ICD-10-CM | POA: Insufficient documentation

## 2022-11-09 DIAGNOSIS — R079 Chest pain, unspecified: Secondary | ICD-10-CM | POA: Diagnosis not present

## 2022-11-09 DIAGNOSIS — J9811 Atelectasis: Secondary | ICD-10-CM | POA: Diagnosis not present

## 2022-11-09 DIAGNOSIS — R0789 Other chest pain: Secondary | ICD-10-CM | POA: Diagnosis not present

## 2022-11-09 DIAGNOSIS — J90 Pleural effusion, not elsewhere classified: Secondary | ICD-10-CM | POA: Diagnosis not present

## 2022-11-09 DIAGNOSIS — I509 Heart failure, unspecified: Secondary | ICD-10-CM | POA: Diagnosis not present

## 2022-11-09 DIAGNOSIS — W19XXXA Unspecified fall, initial encounter: Secondary | ICD-10-CM | POA: Diagnosis not present

## 2022-11-09 LAB — BASIC METABOLIC PANEL
Anion gap: 14 (ref 5–15)
BUN: 21 mg/dL — ABNORMAL HIGH (ref 6–20)
CO2: 23 mmol/L (ref 22–32)
Calcium: 8.5 mg/dL — ABNORMAL LOW (ref 8.9–10.3)
Chloride: 97 mmol/L — ABNORMAL LOW (ref 98–111)
Creatinine, Ser: 1.41 mg/dL — ABNORMAL HIGH (ref 0.61–1.24)
GFR, Estimated: 58 mL/min — ABNORMAL LOW (ref >60)
Glucose, Bld: 143 mg/dL — ABNORMAL HIGH (ref 70–99)
Potassium: 4.3 mmol/L (ref 3.5–5.1)
Sodium: 134 mmol/L — ABNORMAL LOW (ref 135–145)

## 2022-11-09 LAB — CBC
HCT: 26.7 % — ABNORMAL LOW (ref 39.0–52.0)
Hemoglobin: 8.2 g/dL — ABNORMAL LOW (ref 13.0–17.0)
MCH: 27.5 pg (ref 26.0–34.0)
MCHC: 30.7 g/dL (ref 30.0–36.0)
MCV: 89.6 fL (ref 80.0–100.0)
Platelets: 517 10*3/uL — ABNORMAL HIGH (ref 150–400)
RBC: 2.98 MIL/uL — ABNORMAL LOW (ref 4.22–5.81)
RDW: 15.8 % — ABNORMAL HIGH (ref 11.5–15.5)
WBC: 15.7 10*3/uL — ABNORMAL HIGH (ref 4.0–10.5)
nRBC: 1.3 % — ABNORMAL HIGH (ref 0.0–0.2)

## 2022-11-09 LAB — BRAIN NATRIURETIC PEPTIDE: B Natriuretic Peptide: 309.6 pg/mL — ABNORMAL HIGH (ref 0.0–100.0)

## 2022-11-09 LAB — TROPONIN I (HIGH SENSITIVITY)
Troponin I (High Sensitivity): 62 ng/L — ABNORMAL HIGH (ref <18)
Troponin I (High Sensitivity): 59 ng/L — ABNORMAL HIGH (ref <18)

## 2022-11-09 LAB — LACTIC ACID, PLASMA: Lactic Acid, Venous: 1.8 mmol/L (ref 0.5–1.9)

## 2022-11-09 MED ORDER — IOHEXOL 350 MG/ML SOLN
100.0000 mL | Freq: Once | INTRAVENOUS | Status: AC | PRN
  Administered 2022-11-09: 100 mL via INTRAVENOUS

## 2022-11-09 MED ORDER — MORPHINE SULFATE (PF) 4 MG/ML IV SOLN
4.0000 mg | Freq: Once | INTRAVENOUS | Status: AC
  Administered 2022-11-09: 4 mg via INTRAVENOUS
  Filled 2022-11-09: qty 1

## 2022-11-09 NOTE — Progress Notes (Signed)
  Evaluation after Contrast Extravasation  Patient seen and examined immediately after contrast extravasation while in [facility] CT.  Exam: There is no swelling at the [facility] area.  There is no erythema. There is no discoloration. There are no blisters. There are no signs of decreased perfusion of the skin.  It is not warm to touch.  The patient has full ROM in fingers.  Radial pulse is normal. 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  He c/o 10/10 pain at extravasation site.   Per contrast extravasation protocol, I have instructed the patient to keep an ice pack on the area for 20-60 minutes at a time for about 48 hours.   Keep arm elevated as much as possible.   The patient understands to call the radiology department if there is: - increase in pain or swelling - changed or altered sensation - ulceration or blistering - increasing redness - warmth or increasing firmness - decreased tissue perfusion as noted by decreased capillary refill or discoloration of skin - decreased pulses peripheral to site   Shon Hough PA-C 11/09/2022 1:05 PM

## 2022-11-09 NOTE — Telephone Encounter (Signed)
Pt's wife stated pt fell this morning and he's hurting. She said EMS came this morning around 7 and his vitals were fine but he didn't want to leave with them to be seen at the hospital. [facility] like a callback to discuss further. Please advise

## 2022-11-09 NOTE — ED Provider Notes (Signed)
[facility] Provider Note    Event Date/Time   First MD Initiated Contact with Patient 11/09/22 0932     (approximate)   History   Chest Pain   HPI  Brett Marshall is a 57 y.o. male with a history of CAD status post stenting, aortic stenosis status post aortic valve replacement on 6/14, hypertension, hyperlipidemia, CHF, and type 2 diabetes who presents with chest pain, shortness of breath, and generalized weakness.  The patient states that he has been having sharp substernal and left-sided chest pain intermittently since his surgery 10 days ago although it has worsened over the last 1 to 2 days.  He has also had worsening shortness of breath over the last 1 to 2 days.  Today, he has had increased weakness.  He was attempting to get out of his recliner, leaning forward, and felt like his legs were weak and about to give out on him.  This caused him to slide down but he did not fall or hit his head.  He did not feel lightheaded or like he was going to pass out, and did not lose consciousness.  He reports worsening lower extremity edema over the last few days.  I reviewed the past medical records.  The patient was admitted to [facility] earlier this month and had aortic valve replacement with a bioprosthetic valve on 6/14.  He was discharged on 6/17.   Physical Exam   Triage Vital Signs: ED Triage Vitals [11/09/22 0929]  Enc Vitals Group     BP 101/74     Pulse Rate 71     Resp 18     Temp 98.3 F (36.8 C)     Temp src      SpO2 100 %     Weight      Height      Head Circumference      Peak Flow      Pain Score 10     Pain Loc      Pain Edu?      Excl. in GC?     Most recent vital signs: Vitals:   11/09/22 1419 11/09/22 1500  BP: 115/83 (!) 111/90  Pulse:  70  Resp: (!) 24 15  Temp:    SpO2:  93%     General: Alert and oriented, no distress.  CV:  Good peripheral perfusion.   Resp:  Normal effort.  Lungs CTAB. Abd:  No distention.   Other:  2+ bilateral lower extremity edema.  Motor intact in all extremities.   ED Results / Procedures / Treatments   Labs (all labs ordered are listed, but only abnormal results are displayed) Labs Reviewed  BASIC METABOLIC PANEL - Abnormal; Notable for the following components:      Result Value   Sodium 134 (*)    Chloride 97 (*)    Glucose, Bld 143 (*)    BUN 21 (*)    Creatinine, Ser 1.41 (*)    Calcium 8.5 (*)    GFR, Estimated 58 (*)    All other components within normal limits  CBC - Abnormal; Notable for the following components:   WBC 15.7 (*)    RBC 2.98 (*)    Hemoglobin 8.2 (*)    HCT 26.7 (*)    RDW 15.8 (*)    Platelets 517 (*)    nRBC 1.3 (*)    All other components within normal limits  BRAIN NATRIURETIC PEPTIDE - Abnormal;  Notable for the following components:   B Natriuretic Peptide 309.6 (*)    All other components within normal limits  TROPONIN I (HIGH SENSITIVITY) - Abnormal; Notable for the following components:   Troponin I (High Sensitivity) 62 (*)    All other components within normal limits  TROPONIN I (HIGH SENSITIVITY) - Abnormal; Notable for the following components:   Troponin I (High Sensitivity) 59 (*)    All other components within normal limits  LACTIC ACID, PLASMA     EKG  ED ECG REPORT I, Dionne Bucy, the attending physician, personally viewed and interpreted this ECG.  Date: 11/09/2022 EKG Time: 09 34 Rate: 72 Rhythm: normal sinus rhythm QRS Axis: normal Intervals: normal ST/T Wave abnormalities: normal Narrative Interpretation: no evidence of acute ischemia    RADIOLOGY  Chest x-ray: I independently viewed and interpreted the images; there are small effusions with no focal consolidation or edema.  CT chest:  IMPRESSION: 1. No evidence of acute aortic syndrome. 2. Prevascular collections of fluid and air likely represent hematomas status post median sternotomy and aortic valve repair 10 days ago.  Difficult to definitively exclude a developing substernal abscess. Please correlate clinically. 3. Small to moderate bilateral pleural effusions with lingular and bilateral lower lobe atelectasis. 4. Aortic atherosclerosis (ICD10-I70.0). Coronary artery calcification.   PROCEDURES:  Critical Care performed: No  Procedures   MEDICATIONS ORDERED IN ED: Medications  morphine (PF) 4 MG/ML injection 4 mg (4 mg Intravenous Given 11/09/22 1042)  iohexol (OMNIPAQUE) 350 MG/ML injection 100 mL (100 mLs Intravenous Contrast Given 11/09/22 1234)     IMPRESSION / MDM / ASSESSMENT AND PLAN / ED COURSE  I reviewed the triage vital signs and the nursing notes.  57 year old male with PMH as noted above presents with worsening chest pain and shortness of breath over the last few days after recent aortic valve surgery along with an episode of generalized weakness today causing him to almost fall.  On exam the blood pressure is borderline low.  Other vital signs are normal.  Neuro exam is nonfocal.  There is lower extremity edema.  Differential diagnosis includes, but is not limited to, CHF exacerbation, fluid overload due to other etiology, electrolyte abnormality, hypoglycemia, other metabolic disturbance, ACS, acute infection/sepsis.  I have a lower suspicion for postsurgical complication.  The chest pain has been occurring since the surgery and only gradually worsening.  We will obtain chest x-ray, lab workup, and reassess.  Patient's presentation is most consistent with acute presentation with potential threat to life or bodily function.  The patient is on the cardiac monitor to evaluate for evidence of arrhythmia and/or significant heart rate changes.  ----------------------------------------- 3:28 PM on 11/09/2022 -----------------------------------------  Lab workup is overall reassuring.  WBC count is elevated from the patient's recent labs, but his hemoglobin is improving.  Lactate was added  on and is normal.  Troponins are around 60 with no increase from the first of the second.  This is consistent with mild demand ischemia after the surgery.  Creatinine is consistent with baseline and electrolytes are unremarkable.  Chest x-ray showed small effusions but no edema or other concerning acute findings.  We then obtained a CT of the chest.  The patient had contrast extravasation so did not get the full bolus.  There is a fluid collection and abscess cannot be completely ruled out.  Overall my clinical suspicion for abscess is low given the patient's normal vital signs, lack of fever, and normal lactate.  I consulted  and discussed the case with Dr. Leafy Ro from CT surgery who performed the patient's surgery 10 days ago.  We reviewed the clinical presentation, vital signs, lab workup, as well as the imaging including the CT.  Dr. Leafy Ro reviewed the images.  He advises that these fluid collections are normal postsurgery and that there is no evidence of clinically significant hematoma or abscess.  He advises that the elevation in troponin is normal and he is not concerned about the leukocytosis.  He does not recommend any further workup or intervention at this time.  The patient has follow-up in 2 days.  We also checked orthostatic vital signs, which were stable.  The patient reported only mild lightheadedness when standing.  At this time based on the results of the workup and the recommendations of the cardiothoracic surgeon, there is no indication for further ED workup.  There is no specific treatment that would require admission.  The patient still has some mild pain but appears much more comfortable.  I counseled him and his wife on the results of the workup and CT surgery recommendations.  They feel comfortable going home.  I gave strict return precautions and the patient expressed understanding.   FINAL CLINICAL IMPRESSION(S) / ED DIAGNOSES   Final diagnoses:  Postoperative pain   Weakness     Rx / DC Orders   ED Discharge Orders     None        Note:  This document was prepared using Dragon voice recognition software and may include unintentional dictation errors.    Dionne Bucy, MD 11/09/22 [phone]

## 2022-11-09 NOTE — Discharge Instructions (Addendum)
Follow-up with the surgeons office on Wednesday as scheduled.  Continue taking the normal pain medication as prescribed, as well as all of your other chronic medications.  In the meantime, return to the ER immediately for new, worsening, or persistent severe chest pain, difficulty breathing, weakness or lightheadedness, leg swelling, feeling like you are unable to stand or move around, recurrent falls, or any other new or worsening symptoms that concern you.

## 2022-11-09 NOTE — ED Notes (Signed)
Pt in x-ray at this time

## 2022-11-09 NOTE — ED Triage Notes (Signed)
Pt comes with c/o cp. Pt states he has surgery on the 14th. Pt states he had open heart surgery. Pt states he was sitting in chair and went to stand up and fell. Pt denies any loc or hitting head. Pt states it was more of a slide. Pt was then unable to get up.

## 2022-11-09 NOTE — ED Notes (Signed)
Pharmacy called for IV that infiltrated contrast in CT. Per pharmacy - apply cold compress and possibly consult plastics. Dr Marisa Severin notified. Cold compress applied by CT.

## 2022-11-09 NOTE — Telephone Encounter (Signed)
Patient s/p valve replacement surgery 6/14 and had fallen today 6/24. Patient denies hitting head but reports pain near incision. Patient's spouse stated that he had fallen on his buttocks but now is complaining of difficulty breathing and only taking shallow breaths. Patient's spouse stated that the patient "is not his normal color." She stated that she had given him some pain medications oxycodone and hour ago but the pain remains. Instructed patient's spouse to either present to an urgent care clinic or emergency department to be assessed and evaluated as soon as possible. Patient's spouse understood with read back

## 2022-11-10 ENCOUNTER — Other Ambulatory Visit: Payer: Self-pay | Admitting: Thoracic Surgery (Cardiothoracic Vascular Surgery)

## 2022-11-10 ENCOUNTER — Other Ambulatory Visit: Payer: Self-pay

## 2022-11-10 DIAGNOSIS — Z952 Presence of prosthetic heart valve: Secondary | ICD-10-CM

## 2022-11-11 ENCOUNTER — Ambulatory Visit: Payer: Self-pay | Admitting: Surgical

## 2022-11-11 ENCOUNTER — Other Ambulatory Visit: Payer: Self-pay

## 2022-11-11 ENCOUNTER — Inpatient Hospital Stay (HOSPITAL_COMMUNITY)
Admission: EM | Admit: 2022-11-11 | Discharge: 2022-11-22 | DRG: 641 | Disposition: A | Discharge status: Home Health | Payer: BLUE CROSS/BLUE SHIELD | Attending: Internal Medicine | Admitting: Internal Medicine

## 2022-11-11 ENCOUNTER — Encounter: Payer: Self-pay | Admitting: Surgical

## 2022-11-11 ENCOUNTER — Encounter (HOSPITAL_COMMUNITY): Payer: Self-pay | Admitting: Emergency Medicine

## 2022-11-11 ENCOUNTER — Emergency Department (HOSPITAL_COMMUNITY): Payer: BLUE CROSS/BLUE SHIELD

## 2022-11-11 ENCOUNTER — Ambulatory Visit
Admission: RE | Admit: 2022-11-11 | Discharge: 2022-11-11 | Disposition: A | Discharge status: Home / Self Care | Payer: BLUE CROSS/BLUE SHIELD | Source: Ambulatory Visit | Attending: Thoracic Surgery (Cardiothoracic Vascular Surgery) | Admitting: Thoracic Surgery (Cardiothoracic Vascular Surgery)

## 2022-11-11 VITALS — BP 89/67 | HR 72 | Resp 18 | Ht 68.0 in | Wt 317.0 lb

## 2022-11-11 DIAGNOSIS — E1142 Type 2 diabetes mellitus with diabetic polyneuropathy: Secondary | ICD-10-CM | POA: Diagnosis present

## 2022-11-11 DIAGNOSIS — J9811 Atelectasis: Secondary | ICD-10-CM | POA: Diagnosis not present

## 2022-11-11 DIAGNOSIS — J90 Pleural effusion, not elsewhere classified: Secondary | ICD-10-CM | POA: Diagnosis not present

## 2022-11-11 DIAGNOSIS — E860 Dehydration: Secondary | ICD-10-CM | POA: Diagnosis present

## 2022-11-11 DIAGNOSIS — I10 Essential (primary) hypertension: Secondary | ICD-10-CM | POA: Diagnosis not present

## 2022-11-11 DIAGNOSIS — K219 Gastro-esophageal reflux disease without esophagitis: Secondary | ICD-10-CM | POA: Diagnosis present

## 2022-11-11 DIAGNOSIS — J984 Other disorders of lung: Secondary | ICD-10-CM | POA: Diagnosis not present

## 2022-11-11 DIAGNOSIS — J929 Pleural plaque without asbestos: Secondary | ICD-10-CM | POA: Diagnosis not present

## 2022-11-11 DIAGNOSIS — E8809 Other disorders of plasma-protein metabolism, not elsewhere classified: Secondary | ICD-10-CM | POA: Diagnosis not present

## 2022-11-11 DIAGNOSIS — E559 Vitamin D deficiency, unspecified: Secondary | ICD-10-CM | POA: Diagnosis present

## 2022-11-11 DIAGNOSIS — I517 Cardiomegaly: Secondary | ICD-10-CM | POA: Diagnosis not present

## 2022-11-11 DIAGNOSIS — R627 Adult failure to thrive: Secondary | ICD-10-CM | POA: Diagnosis not present

## 2022-11-11 DIAGNOSIS — R0600 Dyspnea, unspecified: Secondary | ICD-10-CM | POA: Diagnosis not present

## 2022-11-11 DIAGNOSIS — Z952 Presence of prosthetic heart valve: Secondary | ICD-10-CM

## 2022-11-11 DIAGNOSIS — J9859 Other diseases of mediastinum, not elsewhere classified: Secondary | ICD-10-CM | POA: Diagnosis not present

## 2022-11-11 DIAGNOSIS — D509 Iron deficiency anemia, unspecified: Secondary | ICD-10-CM | POA: Diagnosis not present

## 2022-11-11 DIAGNOSIS — R0602 Shortness of breath: Secondary | ICD-10-CM | POA: Diagnosis not present

## 2022-11-11 DIAGNOSIS — Z6841 Body Mass Index (BMI) 40.0 and over, adult: Secondary | ICD-10-CM | POA: Diagnosis not present

## 2022-11-11 DIAGNOSIS — I350 Nonrheumatic aortic (valve) stenosis: Secondary | ICD-10-CM

## 2022-11-11 DIAGNOSIS — F419 Anxiety disorder, unspecified: Secondary | ICD-10-CM | POA: Diagnosis present

## 2022-11-11 DIAGNOSIS — G8918 Other acute postprocedural pain: Secondary | ICD-10-CM | POA: Diagnosis present

## 2022-11-11 DIAGNOSIS — Z7951 Long term (current) use of inhaled steroids: Secondary | ICD-10-CM

## 2022-11-11 DIAGNOSIS — E611 Iron deficiency: Secondary | ICD-10-CM | POA: Diagnosis present

## 2022-11-11 DIAGNOSIS — Z91199 Patient's noncompliance with other medical treatment and regimen due to unspecified reason: Secondary | ICD-10-CM

## 2022-11-11 DIAGNOSIS — G4700 Insomnia, unspecified: Secondary | ICD-10-CM | POA: Diagnosis present

## 2022-11-11 DIAGNOSIS — G8929 Other chronic pain: Secondary | ICD-10-CM | POA: Diagnosis present

## 2022-11-11 DIAGNOSIS — J45909 Unspecified asthma, uncomplicated: Secondary | ICD-10-CM | POA: Diagnosis not present

## 2022-11-11 DIAGNOSIS — D75839 Thrombocytosis, unspecified: Secondary | ICD-10-CM | POA: Diagnosis present

## 2022-11-11 DIAGNOSIS — D649 Anemia, unspecified: Secondary | ICD-10-CM | POA: Diagnosis not present

## 2022-11-11 DIAGNOSIS — Z7982 Long term (current) use of aspirin: Secondary | ICD-10-CM

## 2022-11-11 DIAGNOSIS — Z825 Family history of asthma and other chronic lower respiratory diseases: Secondary | ICD-10-CM

## 2022-11-11 DIAGNOSIS — R109 Unspecified abdominal pain: Secondary | ICD-10-CM | POA: Diagnosis not present

## 2022-11-11 DIAGNOSIS — Z9889 Other specified postprocedural states: Secondary | ICD-10-CM

## 2022-11-11 DIAGNOSIS — M7989 Other specified soft tissue disorders: Secondary | ICD-10-CM | POA: Diagnosis not present

## 2022-11-11 DIAGNOSIS — R0789 Other chest pain: Secondary | ICD-10-CM | POA: Diagnosis not present

## 2022-11-11 DIAGNOSIS — N179 Acute kidney failure, unspecified: Secondary | ICD-10-CM | POA: Diagnosis not present

## 2022-11-11 DIAGNOSIS — M96841 Postprocedural hematoma of a musculoskeletal structure following other procedure: Secondary | ICD-10-CM | POA: Diagnosis present

## 2022-11-11 DIAGNOSIS — I272 Pulmonary hypertension, unspecified: Secondary | ICD-10-CM | POA: Diagnosis not present

## 2022-11-11 DIAGNOSIS — Z955 Presence of coronary angioplasty implant and graft: Secondary | ICD-10-CM

## 2022-11-11 DIAGNOSIS — I2510 Atherosclerotic heart disease of native coronary artery without angina pectoris: Secondary | ICD-10-CM | POA: Diagnosis not present

## 2022-11-11 DIAGNOSIS — I5033 Acute on chronic diastolic (congestive) heart failure: Secondary | ICD-10-CM | POA: Diagnosis not present

## 2022-11-11 DIAGNOSIS — E785 Hyperlipidemia, unspecified: Secondary | ICD-10-CM | POA: Diagnosis present

## 2022-11-11 DIAGNOSIS — Z833 Family history of diabetes mellitus: Secondary | ICD-10-CM

## 2022-11-11 DIAGNOSIS — Z7984 Long term (current) use of oral hypoglycemic drugs: Secondary | ICD-10-CM

## 2022-11-11 DIAGNOSIS — G4733 Obstructive sleep apnea (adult) (pediatric): Secondary | ICD-10-CM | POA: Diagnosis present

## 2022-11-11 DIAGNOSIS — R918 Other nonspecific abnormal finding of lung field: Secondary | ICD-10-CM | POA: Diagnosis not present

## 2022-11-11 DIAGNOSIS — E538 Deficiency of other specified B group vitamins: Secondary | ICD-10-CM | POA: Diagnosis present

## 2022-11-11 DIAGNOSIS — I110 Hypertensive heart disease with heart failure: Secondary | ICD-10-CM | POA: Diagnosis present

## 2022-11-11 DIAGNOSIS — Z794 Long term (current) use of insulin: Secondary | ICD-10-CM | POA: Diagnosis not present

## 2022-11-11 DIAGNOSIS — I428 Other cardiomyopathies: Secondary | ICD-10-CM | POA: Diagnosis not present

## 2022-11-11 DIAGNOSIS — R0609 Other forms of dyspnea: Secondary | ICD-10-CM | POA: Diagnosis present

## 2022-11-11 DIAGNOSIS — Z79899 Other long term (current) drug therapy: Secondary | ICD-10-CM

## 2022-11-11 DIAGNOSIS — R000 Tachycardia, unspecified: Secondary | ICD-10-CM | POA: Diagnosis present

## 2022-11-11 DIAGNOSIS — I5032 Chronic diastolic (congestive) heart failure: Secondary | ICD-10-CM | POA: Diagnosis present

## 2022-11-11 DIAGNOSIS — R531 Weakness: Secondary | ICD-10-CM | POA: Diagnosis not present

## 2022-11-11 DIAGNOSIS — Z8249 Family history of ischemic heart disease and other diseases of the circulatory system: Secondary | ICD-10-CM

## 2022-11-11 LAB — BASIC METABOLIC PANEL
Anion gap: 13 (ref 5–15)
BUN: 15 mg/dL (ref 6–20)
CO2: 25 mmol/L (ref 22–32)
Calcium: 8.7 mg/dL — ABNORMAL LOW (ref 8.9–10.3)
Chloride: 99 mmol/L (ref 98–111)
Creatinine, Ser: 1.54 mg/dL — ABNORMAL HIGH (ref 0.61–1.24)
GFR, Estimated: 52 mL/min — ABNORMAL LOW (ref >60)
Glucose, Bld: 144 mg/dL — ABNORMAL HIGH (ref 70–99)
Potassium: 5.1 mmol/L (ref 3.5–5.1)
Sodium: 137 mmol/L (ref 135–145)

## 2022-11-11 LAB — CBC
HCT: 28.7 % — ABNORMAL LOW (ref 39.0–52.0)
Hemoglobin: 8.7 g/dL — ABNORMAL LOW (ref 13.0–17.0)
MCH: 27.1 pg (ref 26.0–34.0)
MCHC: 30.3 g/dL (ref 30.0–36.0)
MCV: 89.4 fL (ref 80.0–100.0)
Platelets: 602 10*3/uL — ABNORMAL HIGH (ref 150–400)
RBC: 3.21 MIL/uL — ABNORMAL LOW (ref 4.22–5.81)
RDW: 16 % — ABNORMAL HIGH (ref 11.5–15.5)
WBC: 15.9 10*3/uL — ABNORMAL HIGH (ref 4.0–10.5)
nRBC: 1.3 % — ABNORMAL HIGH (ref 0.0–0.2)

## 2022-11-11 LAB — TROPONIN I (HIGH SENSITIVITY)
Troponin I (High Sensitivity): 39 ng/L — ABNORMAL HIGH (ref <18)
Troponin I (High Sensitivity): 37 ng/L — ABNORMAL HIGH (ref <18)

## 2022-11-11 LAB — GLUCOSE, CAPILLARY: Glucose-Capillary: 66 mg/dL — ABNORMAL LOW (ref 70–99)

## 2022-11-11 MED ORDER — IOHEXOL 350 MG/ML SOLN
75.0000 mL | Freq: Once | INTRAVENOUS | Status: AC | PRN
  Administered 2022-11-11: 75 mL via INTRAVENOUS

## 2022-11-11 MED ORDER — ACETAMINOPHEN 500 MG PO TABS
1000.0000 mg | ORAL_TABLET | Freq: Once | ORAL | Status: AC
  Administered 2022-11-11: 1000 mg via ORAL
  Filled 2022-11-11: qty 2

## 2022-11-11 MED ORDER — TRAZODONE HCL 50 MG PO TABS
50.0000 mg | ORAL_TABLET | Freq: Every day | ORAL | Status: DC
  Administered 2022-11-11 – 2022-11-18 (×8): 50 mg via ORAL
  Filled 2022-11-11 (×8): qty 1

## 2022-11-11 MED ORDER — ONDANSETRON HCL 4 MG/2ML IJ SOLN
4.0000 mg | Freq: Four times a day (QID) | INTRAMUSCULAR | Status: DC | PRN
  Administered 2022-11-19 (×2): 4 mg via INTRAVENOUS
  Filled 2022-11-11 (×2): qty 2

## 2022-11-11 MED ORDER — FAMOTIDINE 20 MG PO TABS
40.0000 mg | ORAL_TABLET | Freq: Every day | ORAL | Status: DC
  Administered 2022-11-11 – 2022-11-21 (×11): 40 mg via ORAL
  Filled 2022-11-11 (×11): qty 2

## 2022-11-11 MED ORDER — ACETAMINOPHEN 650 MG RE SUPP: 650.0000 mg | Freq: Four times a day (QID) | RECTAL | Status: DC | PRN

## 2022-11-11 MED ORDER — ENOXAPARIN SODIUM 40 MG/0.4ML IJ SOSY
40.0000 mg | PREFILLED_SYRINGE | INTRAMUSCULAR | Status: DC
  Administered 2022-11-12 – 2022-11-22 (×11): 40 mg via SUBCUTANEOUS
  Filled 2022-11-11 (×11): qty 0.4

## 2022-11-11 MED ORDER — ASPIRIN 325 MG PO TBEC
325.0000 mg | DELAYED_RELEASE_TABLET | Freq: Every day | ORAL | Status: DC
  Administered 2022-11-12 – 2022-11-22 (×11): 325 mg via ORAL
  Filled 2022-11-11 (×11): qty 1

## 2022-11-11 MED ORDER — MOMETASONE FURO-FORMOTEROL FUM 200-5 MCG/ACT IN AERO
2.0000 | INHALATION_SPRAY | Freq: Two times a day (BID) | RESPIRATORY_TRACT | Status: DC
  Administered 2022-11-12 – 2022-11-15 (×6): 2 via RESPIRATORY_TRACT
  Filled 2022-11-11: qty 8.8

## 2022-11-11 MED ORDER — GABAPENTIN 300 MG PO CAPS
300.0000 mg | ORAL_CAPSULE | Freq: Every day | ORAL | Status: DC
  Administered 2022-11-11 – 2022-11-21 (×11): 300 mg via ORAL
  Filled 2022-11-11 (×11): qty 1

## 2022-11-11 MED ORDER — DIPHENHYDRAMINE HCL 50 MG/ML IJ SOLN
12.5000 mg | Freq: Once | INTRAMUSCULAR | Status: AC
  Administered 2022-11-11: 12.5 mg via INTRAVENOUS
  Filled 2022-11-11: qty 1

## 2022-11-11 MED ORDER — ATORVASTATIN CALCIUM 40 MG PO TABS
40.0000 mg | ORAL_TABLET | Freq: Every day | ORAL | Status: DC
  Administered 2022-11-12 – 2022-11-21 (×10): 40 mg via ORAL
  Filled 2022-11-11 (×10): qty 1

## 2022-11-11 MED ORDER — OXYCODONE HCL 5 MG PO TABS
5.0000 mg | ORAL_TABLET | ORAL | Status: DC | PRN
  Administered 2022-11-12 – 2022-11-18 (×6): 5 mg via ORAL
  Filled 2022-11-11 (×8): qty 1

## 2022-11-11 MED ORDER — METOCLOPRAMIDE HCL 5 MG/ML IJ SOLN
10.0000 mg | Freq: Once | INTRAMUSCULAR | Status: AC
  Administered 2022-11-11: 10 mg via INTRAVENOUS
  Filled 2022-11-11: qty 2

## 2022-11-11 MED ORDER — LACTATED RINGERS IV BOLUS
500.0000 mL | Freq: Once | INTRAVENOUS | Status: AC
  Administered 2022-11-11: 500 mL via INTRAVENOUS

## 2022-11-11 MED ORDER — ONDANSETRON HCL 4 MG PO TABS: 4.0000 mg | ORAL_TABLET | Freq: Four times a day (QID) | ORAL | Status: DC | PRN

## 2022-11-11 MED ORDER — DULOXETINE HCL 60 MG PO CPEP
120.0000 mg | ORAL_CAPSULE | Freq: Every day | ORAL | Status: DC
  Administered 2022-11-11 – 2022-11-21 (×11): 120 mg via ORAL
  Filled 2022-11-11 (×11): qty 2

## 2022-11-11 MED ORDER — INSULIN DETEMIR 100 UNIT/ML ~~LOC~~ SOLN
20.0000 [IU] | Freq: Every day | SUBCUTANEOUS | Status: DC
  Administered 2022-11-12 – 2022-11-22 (×11): 20 [IU] via SUBCUTANEOUS
  Filled 2022-11-11 (×11): qty 0.2

## 2022-11-11 MED ORDER — FENTANYL CITRATE PF 50 MCG/ML IJ SOSY
50.0000 ug | PREFILLED_SYRINGE | Freq: Once | INTRAMUSCULAR | Status: AC
  Administered 2022-11-11: 50 ug via INTRAVENOUS
  Filled 2022-11-11: qty 1

## 2022-11-11 MED ORDER — INSULIN ASPART 100 UNIT/ML IJ SOLN
0.0000 [IU] | Freq: Three times a day (TID) | INTRAMUSCULAR | Status: DC
  Administered 2022-11-12: 4 [IU] via SUBCUTANEOUS
  Administered 2022-11-12 – 2022-11-13 (×4): 2 [IU] via SUBCUTANEOUS
  Administered 2022-11-14: 4 [IU] via SUBCUTANEOUS
  Administered 2022-11-14: 8 [IU] via SUBCUTANEOUS
  Administered 2022-11-14: 2 [IU] via SUBCUTANEOUS
  Administered 2022-11-15: 12 [IU] via SUBCUTANEOUS
  Administered 2022-11-15 – 2022-11-16 (×3): 2 [IU] via SUBCUTANEOUS
  Administered 2022-11-16 (×2): 4 [IU] via SUBCUTANEOUS
  Administered 2022-11-17 (×2): 8 [IU] via SUBCUTANEOUS
  Administered 2022-11-17: 2 [IU] via SUBCUTANEOUS
  Administered 2022-11-18: 4 [IU] via SUBCUTANEOUS
  Administered 2022-11-18 (×2): 8 [IU] via SUBCUTANEOUS

## 2022-11-11 MED ORDER — ACETAMINOPHEN 325 MG PO TABS
650.0000 mg | ORAL_TABLET | Freq: Four times a day (QID) | ORAL | Status: DC | PRN
  Administered 2022-11-13 – 2022-11-17 (×3): 650 mg via ORAL
  Filled 2022-11-11 (×4): qty 2

## 2022-11-11 NOTE — ED Provider Notes (Signed)
[place] EMERGENCY DEPARTMENT AT [facility] Provider Note   CSN: [id number] Arrival date & time: 11/11/22  1504     History  Chief Complaint  Patient presents with   Chest Pain    Jasiri Hanawalt Cournoyer is a 57 y.o. male.   Chest Pain Associated symptoms: shortness of breath      57 year old male with medical history significant for aortic stenosis status post aortic root enlargement procedure with Graydon patch and aortic valve replacement with CT surgery at [facility] on 10/30/2022.  The patient states that he has been feeling poorly since surgery.  He was seen in the emergency department 2 days ago on 6/24 and had CT imaging which revealed a postoperative hematoma very likely.  He endorses some chills.  He was seen in clinic with CT surgery and follow-up and was sent to the emergency room due to concern for failure to thrive as the patient is unable to care for himself in the home postoperatively.  He states that he is moving his bowels and passing gas.  He endorses generalized abdominal discomfort.  Primarily he is concerned with chest pain that is left-sided and severe and sharp.  It hurts to take a deep breath.  He endorses mild shortness of breath.  Home Medications Prior to Admission medications   Medication Sig Start Date End Date Taking? Authorizing Provider  acetaminophen (TYLENOL) 325 MG tablet Take 650 mg by mouth every 6 (six) hours as needed for moderate pain.   Yes [provider]  albuterol (VENTOLIN HFA) 108 (90 Base) MCG/ACT inhaler Inhale 1-2 puffs into the lungs every 6 (six) hours as needed for wheezing or shortness of breath. 09/11/20  Yes Glenford Bayley, NP  aspirin EC 325 MG tablet Take 1 tablet (325 mg total) by mouth daily. 11/05/22  Yes Doree Fudge M, PA-C  atorvastatin (LIPITOR) 40 MG tablet Take 1 tablet (40 mg total) by mouth daily at 6 PM. 11/05/22  Yes Doree Fudge M, PA-C  cyclobenzaprine (FLEXERIL) 10 MG tablet Take 1 tablet  (10 mg total) by mouth 3 (three) times daily as needed.For spasms Patient taking differently: Take 30 mg by mouth daily as needed for muscle spasms. For spasms 11/20/17  Yes Ethelda Chick, MD  DULoxetine (CYMBALTA) 60 MG capsule Take 120 mg by mouth at bedtime. 08/10/22  Yes [provider]  famotidine (PEPCID) 40 MG tablet Take 40 mg by mouth at bedtime. 08/14/18  Yes [provider]  fluticasone (FLONASE) 50 MCG/ACT nasal spray Place 1 spray into both nostrils daily as needed for allergies or rhinitis.   Yes [provider]  fluticasone-salmeterol (ADVAIR) 250-50 MCG/ACT AEPB Inhale 1 puff into the lungs every 12 (twelve) hours. 01/29/22  Yes Glenford Bayley, NP  gabapentin (NEURONTIN) 300 MG capsule Take 300 mg by mouth at bedtime. 09/04/22 09/04/23 Yes [provider]  insulin glargine (LANTUS) 100 unit/mL SOPN Inject 30 Units into the skin daily. 11/05/22  Yes Joycelyn Man, Donielle M, PA-C  JARDIANCE 25 MG TABS tablet Take 25 mg by mouth daily. 06/06/20  Yes [provider]  lidocaine (LIDODERM) 5 % Place 1 patch onto the skin daily. Remove & Discard patch within 12 hours or as directed by MD. May place on right or left side of chest Patient taking differently: Place 1 patch onto the skin daily as needed (pain). Remove & Discard patch within 12 hours or as directed by MD. May place on right or left side of chest  11/05/22  Yes Doree Fudge M, PA-C  metFORMIN (GLUCOPHAGE) 1000 MG tablet Take 1 tablet (1,000 mg total) by mouth 2 (two) times daily with a meal. 12/09/17  Yes Ethelda Chick, MD  oxyCODONE (OXY IR/ROXICODONE) 5 MG immediate release tablet Take 1 tablet (5 mg total) by mouth every 6 (six) hours as needed for severe pain. 11/05/22  Yes Doree Fudge M, PA-C  potassium chloride SA (KLOR-CON M) 20 MEQ tablet Take 1 tablet (20 mEq total) by mouth daily. For 10 days then stop. 11/05/22  Yes Doree Fudge M, PA-C  traZODone (DESYREL) 50 MG  tablet Take 50 mg by mouth at bedtime. 08/19/21  Yes [provider]  ferrous sulfate 325 (65 FE) MG EC tablet Take 1 tablet (325 mg total) by mouth daily with breakfast. 11/05/22 11/05/23  Ardelle Balls, PA-C  furosemide (LASIX) 40 MG tablet Take 1 tablet (40 mg total) by mouth daily. For 10 days then stop. 11/05/22   Doree Fudge M, PA-C  glucose blood (GE100 BLOOD GLUCOSE TEST) test strip Use as instructed 02/03/16   Ethelda Chick, MD  guaiFENesin (MUCINEX) 600 MG 12 hr tablet Take 1 tablet (600 mg total) by mouth 2 (two) times daily as needed. Patient not taking: Reported on 11/11/2022 11/05/22   Ardelle Balls, PA-C  Lancets [facility] ULTRASOFT) lancets Check sugar twice daily 01/11/16   Ethelda Chick, MD  metoprolol tartrate (LOPRESSOR) 25 MG tablet Take 1 tablet (25 mg total) by mouth 2 (two) times daily. 11/05/22   Ardelle Balls, PA-C      Allergies    Patient has no known allergies.    Review of Systems   Review of Systems  Respiratory:  Positive for shortness of breath.   Cardiovascular:  Positive for chest pain.  All other systems reviewed and are negative.   Physical Exam Updated Vital Signs BP 123/75   Pulse 85   Temp 98.2 F (36.8 C) (Oral)   Resp 17   Ht 5\' 8"  (1.727 m)   Wt (!) 144 kg   SpO2 92%   BMI 48.27 kg/m  Physical Exam Vitals and nursing note reviewed.  Constitutional:      General: He is not in acute distress.    Appearance: He is well-developed.  HENT:     Head: Normocephalic and atraumatic.  Eyes:     Conjunctiva/sclera: Conjunctivae normal.  Cardiovascular:     Rate and Rhythm: Normal rate and regular rhythm.     Heart sounds: No murmur heard. Pulmonary:     Effort: Pulmonary effort is normal.     Breath sounds: Normal breath sounds.  Chest:     Comments: Chest wall hematoma present, sternotomy site without erythema or discharge, left-sided chest wall tenderness to palpation Abdominal:     Palpations:  Abdomen is soft.     Tenderness: There is generalized abdominal tenderness.     Comments: Mild generalized abdominal tenderness to palpation, no rebound or guarding  Musculoskeletal:        General: No swelling.     Cervical back: Neck supple.  Skin:    General: Skin is warm and dry.     Capillary Refill: Capillary refill takes less than 2 seconds.  Neurological:     Mental Status: He is alert.  Psychiatric:        Mood and Affect: Mood normal.     ED Results / Procedures / Treatments   Labs (all labs ordered are listed, but  only abnormal results are displayed) Labs Reviewed  BASIC METABOLIC PANEL - Abnormal; Notable for the following components:      Result Value   Glucose, Bld 144 (*)    Creatinine, Ser 1.54 (*)    Calcium 8.7 (*)    GFR, Estimated 52 (*)    All other components within normal limits  CBC - Abnormal; Notable for the following components:   WBC 15.9 (*)    RBC 3.21 (*)    Hemoglobin 8.7 (*)    HCT 28.7 (*)    RDW 16.0 (*)    Platelets 602 (*)    nRBC 1.3 (*)    All other components within normal limits  TROPONIN I (HIGH SENSITIVITY) - Abnormal; Notable for the following components:   Troponin I (High Sensitivity) 39 (*)    All other components within normal limits  TROPONIN I (HIGH SENSITIVITY) - Abnormal; Notable for the following components:   Troponin I (High Sensitivity) 37 (*)    All other components within normal limits    EKG None  Radiology CT Angio Chest PE W and/or Wo Contrast  Result Date: 11/11/2022 CLINICAL DATA:  Pulmonary embolism (PE) suspected, high prob; Abdominal pain, acute, nonlocalized EXAM: CT ANGIOGRAPHY CHEST CT ABDOMEN AND PELVIS WITH CONTRAST TECHNIQUE: Multidetector CT imaging of the chest was performed using the standard protocol during bolus administration of intravenous contrast. Multiplanar CT image reconstructions and MIPs were obtained to evaluate the vascular anatomy. Multidetector CT imaging of the abdomen and  pelvis was performed using the standard protocol during bolus administration of intravenous contrast. RADIATION DOSE REDUCTION: This exam was performed according to the departmental dose-optimization program which includes automated exposure control, adjustment of the mA and/or kV according to patient size and/or use of iterative reconstruction technique. CONTRAST:  75mL OMNIPAQUE IOHEXOL 350 MG/ML SOLN COMPARISON:  CT chest 11/09/2022 FINDINGS: CTA CHEST FINDINGS Cardiovascular: Satisfactory opacification of the pulmonary arteries to the segmental level. No evidence of pulmonary embolism. Normal heart size. No significant pericardial effusion. Status post aortic valve replacement. The thoracic aorta is normal in caliber. No atherosclerotic plaque of the thoracic aorta. At least 2 vessel coronary artery calcifications. Left anterior descending coronary artery stent. Mediastinum/Nodes: Stable 6.4 x 3.7 cm prevascular fluid and gas collection likely representing a hematoma status post median sternotomy and aortic valve repair. No active extravasation of intravenous contrast or increased density within the collection. No interval peripheral enhancement to suggest abscess formation. No enlarged mediastinal, hilar, or axillary lymph nodes. Thyroid gland, trachea, and esophagus demonstrate no significant findings. Lungs/Pleura: No focal consolidation. No pulmonary nodule. No pulmonary mass. Stable bilateral small pleural effusions. No pneumothorax. Musculoskeletal: Similar-appearing gas and fluid collection along the anterior aspect of the manubrium and manubrium clavicular joints which appears to possibly be contiguous with the pre-vascular collection. No cortical erosion or destruction. No suspicious lytic or blastic osseous lesions. No acute displaced fracture. Multilevel degenerative changes of the spine. Intact sternotomy wires. Review of the MIP images confirms the above findings. CT ABDOMEN and PELVIS FINDINGS  Hepatobiliary: The hepatic parenchyma is diffusely hypodense compared to the splenic parenchyma consistent with fatty infiltration. No focal liver abnormality. No gallstones, gallbladder wall thickening, or pericholecystic fluid. No biliary dilatation. Pancreas: No focal lesion. Normal pancreatic contour. No surrounding inflammatory changes. No main pancreatic ductal dilatation. Spleen: Normal in size without focal abnormality. Adrenals/Urinary Tract: No adrenal nodule bilaterally. Bilateral kidneys enhance symmetrically. No hydronephrosis. No hydroureter. The urinary bladder is unremarkable. Stomach/Bowel: Stomach is within normal  limits. No evidence of bowel wall thickening or dilatation. Appendix appears normal. Vascular/Lymphatic: No abdominal aorta or iliac aneurysm. No abdominal, pelvic, or inguinal lymphadenopathy. Reproductive: Prostate is unremarkable. Other: No intraperitoneal free fluid. No intraperitoneal free gas. No organized fluid collection. Musculoskeletal: No abdominal wall hernia or abnormality. No suspicious lytic or blastic osseous lesions. No acute displaced fracture. Multilevel degenerative changes of the spine. Review of the MIP images confirms the above findings. IMPRESSION: 1. Stable in size and appearance of a 6.4 x 3.7 cm prevascular fluid and gas collection likely representing a hematoma status post median sternotomy and aortic valve repair. Similar-appearing gas and fluid collection along the anterior aspect of the manubrium and manubrium clavicular joints which appears to possibly be contiguous with the pre-vascular collection. No active extravasation of intravenous contrast or increased density within the collection to suggest hemorrhage. No interval peripheral enhancement to suggest abscess formation; although, infection of the sternotomy and anterior mediastinum is not excluded. No cortical erosion or destruction of the sternum to suggest osteomyelitis. Sternotomy wires are intact.  2. Bilateral small volume pleural effusions. 3. No acute intra-abdominal or intrapelvic abnormality. Electronically Signed   By: Tish Frederickson M.D.   On: 11/11/2022 20:40   CT ABDOMEN PELVIS W CONTRAST  Result Date: 11/11/2022 CLINICAL DATA:  Pulmonary embolism (PE) suspected, high prob; Abdominal pain, acute, nonlocalized EXAM: CT ANGIOGRAPHY CHEST CT ABDOMEN AND PELVIS WITH CONTRAST TECHNIQUE: Multidetector CT imaging of the chest was performed using the standard protocol during bolus administration of intravenous contrast. Multiplanar CT image reconstructions and MIPs were obtained to evaluate the vascular anatomy. Multidetector CT imaging of the abdomen and pelvis was performed using the standard protocol during bolus administration of intravenous contrast. RADIATION DOSE REDUCTION: This exam was performed according to the departmental dose-optimization program which includes automated exposure control, adjustment of the mA and/or kV according to patient size and/or use of iterative reconstruction technique. CONTRAST:  75mL OMNIPAQUE IOHEXOL 350 MG/ML SOLN COMPARISON:  CT chest 11/09/2022 FINDINGS: CTA CHEST FINDINGS Cardiovascular: Satisfactory opacification of the pulmonary arteries to the segmental level. No evidence of pulmonary embolism. Normal heart size. No significant pericardial effusion. Status post aortic valve replacement. The thoracic aorta is normal in caliber. No atherosclerotic plaque of the thoracic aorta. At least 2 vessel coronary artery calcifications. Left anterior descending coronary artery stent. Mediastinum/Nodes: Stable 6.4 x 3.7 cm prevascular fluid and gas collection likely representing a hematoma status post median sternotomy and aortic valve repair. No active extravasation of intravenous contrast or increased density within the collection. No interval peripheral enhancement to suggest abscess formation. No enlarged mediastinal, hilar, or axillary lymph nodes. Thyroid gland,  trachea, and esophagus demonstrate no significant findings. Lungs/Pleura: No focal consolidation. No pulmonary nodule. No pulmonary mass. Stable bilateral small pleural effusions. No pneumothorax. Musculoskeletal: Similar-appearing gas and fluid collection along the anterior aspect of the manubrium and manubrium clavicular joints which appears to possibly be contiguous with the pre-vascular collection. No cortical erosion or destruction. No suspicious lytic or blastic osseous lesions. No acute displaced fracture. Multilevel degenerative changes of the spine. Intact sternotomy wires. Review of the MIP images confirms the above findings. CT ABDOMEN and PELVIS FINDINGS Hepatobiliary: The hepatic parenchyma is diffusely hypodense compared to the splenic parenchyma consistent with fatty infiltration. No focal liver abnormality. No gallstones, gallbladder wall thickening, or pericholecystic fluid. No biliary dilatation. Pancreas: No focal lesion. Normal pancreatic contour. No surrounding inflammatory changes. No main pancreatic ductal dilatation. Spleen: Normal in size without focal abnormality. Adrenals/Urinary Tract:  No adrenal nodule bilaterally. Bilateral kidneys enhance symmetrically. No hydronephrosis. No hydroureter. The urinary bladder is unremarkable. Stomach/Bowel: Stomach is within normal limits. No evidence of bowel wall thickening or dilatation. Appendix appears normal. Vascular/Lymphatic: No abdominal aorta or iliac aneurysm. No abdominal, pelvic, or inguinal lymphadenopathy. Reproductive: Prostate is unremarkable. Other: No intraperitoneal free fluid. No intraperitoneal free gas. No organized fluid collection. Musculoskeletal: No abdominal wall hernia or abnormality. No suspicious lytic or blastic osseous lesions. No acute displaced fracture. Multilevel degenerative changes of the spine. Review of the MIP images confirms the above findings. IMPRESSION: 1. Stable in size and appearance of a 6.4 x 3.7 cm  prevascular fluid and gas collection likely representing a hematoma status post median sternotomy and aortic valve repair. Similar-appearing gas and fluid collection along the anterior aspect of the manubrium and manubrium clavicular joints which appears to possibly be contiguous with the pre-vascular collection. No active extravasation of intravenous contrast or increased density within the collection to suggest hemorrhage. No interval peripheral enhancement to suggest abscess formation; although, infection of the sternotomy and anterior mediastinum is not excluded. No cortical erosion or destruction of the sternum to suggest osteomyelitis. Sternotomy wires are intact. 2. Bilateral small volume pleural effusions. 3. No acute intra-abdominal or intrapelvic abnormality. Electronically Signed   By: Tish Frederickson M.D.   On: 11/11/2022 20:40   DG Chest 2 View  Result Date: 11/11/2022 CLINICAL DATA:  Status post AVR this week. Shortness of breath and fatigue. Fall 2 days ago. EXAM: CHEST - 2 VIEW COMPARISON:  None Available. FINDINGS: The heart is mildly enlarged with prior sternotomy. Prosthetic aortic valve is noted. Left basilar opacity suggesting atelectasis and/or small effusion. The visualized skeletal structures are unremarkable. IMPRESSION: Left basilar opacity suggesting atelectasis and/or small effusion. Stable mild cardiomegaly. Electronically Signed   By: Larose Hires D.O.   On: 11/11/2022 13:56    Procedures Procedures    Medications Ordered in ED Medications  atorvastatin (LIPITOR) tablet 40 mg (has no administration in time range)  aspirin EC tablet 325 mg (has no administration in time range)  DULoxetine (CYMBALTA) DR capsule 120 mg (has no administration in time range)  traZODone (DESYREL) tablet 50 mg (has no administration in time range)  insulin detemir (LEVEMIR) injection 20 Units (has no administration in time range)  insulin aspart (novoLOG) injection 0-24 Units (has no  administration in time range)  fentaNYL (SUBLIMAZE) injection 50 mcg (50 mcg Intravenous Given 11/11/22 1833)  metoCLOPramide (REGLAN) injection 10 mg (10 mg Intravenous Given 11/11/22 1833)  lactated ringers bolus 500 mL (0 mLs Intravenous Stopped 11/11/22 2032)  diphenhydrAMINE (BENADRYL) injection 12.5 mg (12.5 mg Intravenous Given 11/11/22 1833)  acetaminophen (TYLENOL) tablet 1,000 mg (1,000 mg Oral Given 11/11/22 1834)  iohexol (OMNIPAQUE) 350 MG/ML injection 75 mL (75 mLs Intravenous Contrast Given 11/11/22 2006)    ED Course/ Medical Decision Making/ A&P                             Medical Decision Making Amount and/or Complexity of Data Reviewed Labs: ordered. Radiology: ordered.  Risk OTC drugs. Prescription drug management. Decision regarding hospitalization.    57 year old male with medical history significant for aortic stenosis status post aortic root enlargement procedure with Graydon patch and aortic valve replacement with CT surgery at [facility] on 10/30/2022.  The patient states that he has been feeling poorly since surgery.  He was seen in the emergency department 2 days ago on  6/24 and had CT imaging which revealed a postoperative hematoma very likely.  He endorses some chills.  He was seen in clinic with CT surgery and follow-up and was sent to the emergency room due to concern for failure to thrive as the patient is unable to care for himself in the home postoperatively.  He states that he is moving his bowels and passing gas.  He endorses generalized abdominal discomfort.  Primarily he is concerned with chest pain that is left-sided and severe and sharp.  It hurts to take a deep breath.  He endorses mild shortness of breath.  On arrival, the patient was afebrile, not tachycardic, BP 111/92, respiratory 18, saturating 98% on room air.  Sinus rhythm noted on cardiac telemetry.  Physical exam significant for chest wall hematoma present, sternotomy site without erythema or discharge,  left-sided chest wall tenderness to palpation, additionally generalized abdominal tenderness present.  Considered PE, postoperative pain, ACS, pericarditis, postoperative infection.  CTA PE study was ordered in addition to CT abdomen pelvis.  CTA PE and Abd Pelvis: IMPRESSION:  1. Stable in size and appearance of a 6.4 x 3.7 cm prevascular fluid  and gas collection likely representing a hematoma status post median  sternotomy and aortic valve repair. Similar-appearing gas and fluid  collection along the anterior aspect of the manubrium and manubrium  clavicular joints which appears to possibly be contiguous with the  pre-vascular collection. No active extravasation of intravenous  contrast or increased density within the collection to suggest  hemorrhage. No interval peripheral enhancement to suggest abscess  formation; although, infection of the sternotomy and anterior  mediastinum is not excluded. No cortical erosion or destruction of  the sternum to suggest osteomyelitis. Sternotomy wires are intact.  2. Bilateral small volume pleural effusions.  3. No acute intra-abdominal or intrapelvic abnormality.    The patient had endorsed a headache which improved following a migraine cocktail.  It had been located in a bandlike pattern across his forehead.  It was resolved on repeat assessment.  No fever or neck rigidity.  The patient felt symptomatically improved from a pain control standpoint after IV fentanyl.  His laboratory evaluation was significant for an AKI with an elevation in serum creatinine to 1.54 from baseline of around 1, no significant electrolyte abnormality, troponins elevated but flat at 37 and 39, CBC with a nonspecific leukocytosis to 15.9, potentially expected postoperatively, hemoglobin stable at 8.7.  Given the patient's AKI and failure to thrive, I recommended admission to the hospitalist service for rehydration, continuing monitoring of his renal function and PT OT  consultation for placement. Dr. Avie Arenas accepted the patient in admission.    Final Clinical Impression(s) / ED Diagnoses Final diagnoses:  Failure to thrive in adult  Acute post-operative pain  AKI (acute kidney injury) [facility])    Rx / DC Orders ED Discharge Orders     None         Ernie Avena, MD 11/11/22 2115

## 2022-11-11 NOTE — ED Notes (Signed)
ED TO INPATIENT HANDOFF REPORT  ED Nurse Name and Phone #: Pennie Rushing A. Tyshaun Vinzant, RN [phone]  S Name/Age/Gender Brett Marshall 57 y.o. male Room/Bed: 019C/019C  Code Status   Code Status: Full Code  Home/SNF/Other Home Patient oriented to: self, place, time, and situation Is this baseline? Yes   Triage Complete: Triage complete  Chief Complaint Failure to thrive in adult [R62.7]  Triage Note Pt endorses generalized CP since having surgery on the 14th. States he thinks they sent him home too early. Seen at [facility] on 24th and sent home. Went to doc today and they are worried about failure to thrive. Pt states his wife can't take care of him at home.    Allergies No Known Allergies  Level of Care/Admitting Diagnosis ED Disposition     ED Disposition  Admit   Condition  --   Comment  Hospital Area: [facility] [[id number]]  Level of Care: Telemetry Medical [104]  May place patient in observation at [facility] or [facility] if equivalent level of care is available:: Yes  Covid Evaluation: Asymptomatic - no recent exposure (last 10 days) testing not required  Diagnosis: Failure to thrive in adult [358490]  Admitting Physician: Alan Mulder [[id number]]  Attending Physician: Alan Mulder [[id number]]          B Medical/Surgery History Past Medical History:  Diagnosis Date   Asthma    CAD (coronary artery disease)    a. 08/2017 Cath: Diff nonobs dzs, EF 45-50%, diff HK; b. 08/2021 MV: Ant/apical ishemia; c. 08/2021 PCI: LAD 95ost (4.0x15 Onyx Frontier DES), EF 55-65%; c. 09/2021 Cath: LM nl, LAD patent stent, 82m, 30d, RI min irregs, LCX min irregs, RCA 50p. LVOT grad 50-55mmHg (rest), 70-139mmHg (provoked).   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 08/2017 LV gram: EF 45-50%, glob HK; b. 08/2021 Echo: EF 60-65%, no rwma, GrI DD, nl RV fxn; c. 09/2021 cMRI: EF 60%, no LVOT obstruction/gradient. Mild conc LVH, mild AS. No myocardial scar/fibrosis. No evidence of HCM.   Hyperlipidemia    Leg swelling    Left   Low back pain    Mixed ICM & NICM    a. 08/2017 LV gram: EF 45-50%, glob HK; b. 08/2021 Echo: EF 60-65%.   Morbid obesity (HCC)    OSA on CPAP    non compliant   Pulmonary hypertension (HCC)    Past Surgical History:  Procedure Laterality Date   AORTIC ROOT ENLARGEMENT N/A 10/30/2022   Procedure: AORTIC ROOT ENLARGEMENT AND SUBAORTIC MYOMECTOMY;  Surgeon: Eugenio Hoes, MD;  Location: [facility] OR;  Service: Open Heart Surgery;  Laterality: N/A;   AORTIC VALVE REPLACEMENT N/A 10/30/2022   Procedure: AORTIC VALVE REPLACEMENT (AVR) USING INSPIRIS RESILIA AORTIC VALVE;  Surgeon: Eugenio Hoes, MD;  Location: [facility] OR;  Service: Open Heart Surgery;  Laterality: N/A;  Median sternotomy   CARDIAC CATHETERIZATION     [place] no stents.   COLONOSCOPY WITH PROPOFOL N/A 04/11/2019   Procedure: COLONOSCOPY WITH PROPOFOL;  Surgeon: Midge Minium, MD;  Location: [facility] ENDOSCOPY;  Service: Endoscopy;  Laterality: N/A;   CORONARY STENT INTERVENTION N/A 09/08/2021   Procedure: CORONARY STENT INTERVENTION;  Surgeon: Lorine Bears  A, MD;  Location: [facility] INVASIVE CV LAB;  Service: Cardiovascular;  Laterality: N/A;   CORONARY ULTRASOUND/IVUS N/A 09/08/2021   Procedure: Intravascular Ultrasound/IVUS;  Surgeon: [name], MD;  Location: [facility] INVASIVE CV LAB;  Service: Cardiovascular;  Laterality: N/A;   LEFT HEART CATH AND CORONARY ANGIOGRAPHY N/A 09/08/2021   Procedure: LEFT HEART CATH AND CORONARY ANGIOGRAPHY;  Surgeon: [name], MD;  Location: [facility] INVASIVE CV LAB;  Service: Cardiovascular;  Laterality: N/A;   LEFT HEART CATH AND CORONARY ANGIOGRAPHY N/A 09/17/2021   Procedure: LEFT HEART CATH AND CORONARY ANGIOGRAPHY;  Surgeon:  Yvonne Kendall, MD;  Location: [facility] INVASIVE CV LAB;  Service: Cardiovascular;  Laterality: N/A;   LEFT HEART CATH AND CORONARY ANGIOGRAPHY N/A 09/08/2022   Procedure: LEFT HEART CATH AND CORONARY ANGIOGRAPHY;  Surgeon: Yvonne Kendall, MD;  Location: [facility] INVASIVE CV LAB;  Service: Cardiovascular;  Laterality: N/A;   RIGHT/LEFT HEART CATH AND CORONARY ANGIOGRAPHY N/A 09/02/2017   Procedure: RIGHT/LEFT HEART CATH AND CORONARY ANGIOGRAPHY;  Surgeon: [name], MD;  Location: [facility] INVASIVE CV LAB;  Service: Cardiovascular;  Laterality: N/A;   RIGHT/LEFT HEART CATH AND CORONARY ANGIOGRAPHY Bilateral 10/19/2022   Procedure: RIGHT/LEFT HEART CATH AND CORONARY ANGIOGRAPHY;  Surgeon: [name], MD;  Location: [facility] INVASIVE CV LAB;  Service: Cardiovascular;  Laterality: Bilateral;   TEE WITHOUT CARDIOVERSION N/A 09/10/2022   Procedure: TRANSESOPHAGEAL ECHOCARDIOGRAM;  Surgeon: Antonieta Iba, MD;  Location: [facility] ORS;  Service: Cardiovascular;  Laterality: N/A;   TEE WITHOUT CARDIOVERSION N/A 10/30/2022   Procedure: TRANSESOPHAGEAL ECHOCARDIOGRAM;  Surgeon: Eugenio Hoes, MD;  Location: [facility] OR;  Service: Open Heart Surgery;  Laterality: N/A;     A IV Location/Drains/Wounds Patient Lines/Drains/Airways Status     Active Line/Drains/Airways     Name Placement date Placement time Site Days   Peripheral IV 11/11/22 20 G 1.88" Right Antecubital 11/11/22  1802  Antecubital  less than 1   Wound / Incision (Open or Dehisced) 11/01/22 Non-pressure wound;Other (Comment) Thigh Left;Upper;Medial;Posterior 2 Pink abrasions from ruptured blisters 11/01/22  1600  Thigh  10            Intake/Output Last 24 hours No intake or output data in the 24 hours ending 11/11/22 2137  Labs/Imaging Results for orders placed or performed during the hospital encounter of 11/11/22 (from the past 48 hour(s))  Basic metabolic panel     Status: Abnormal   Collection Time: 11/11/22  3:25 PM  Result Value Ref  Range   Sodium 137 135 - 145 mmol/L   Potassium 5.1 3.5 - 5.1 mmol/L   Chloride 99 98 - 111 mmol/L   CO2 25 22 - 32 mmol/L   Glucose, Bld 144 (H) 70 - 99 mg/dL    Comment: Glucose reference range applies only to samples taken after fasting for at least 8 hours.   BUN 15 6 - 20 mg/dL   Creatinine, Ser 5.63 (H) 0.61 - 1.24 mg/dL   Calcium 8.7 (L) 8.9 - 10.3 mg/dL   GFR, Estimated 52 (L) >60 mL/min    Comment: (NOTE) Calculated using the CKD-EPI Creatinine Equation (2021)    Anion gap 13 5 - 15    Comment: Performed at [facility], [address] [place], [place] [address]  CBC     Status: Abnormal   Collection Time: 11/11/22  3:25 PM  Result Value Ref Range   WBC 15.9 (H) 4.0 - 10.5 K/uL   RBC 3.21 (L) 4.22 - 5.81 MIL/uL  Hemoglobin 8.7 (L) 13.0 - 17.0 g/dL   HCT 88.4 (L) 16.6 - 06.3 %   MCV 89.4 80.0 - 100.0 fL   MCH 27.1 26.0 - 34.0 pg   MCHC 30.3 30.0 - 36.0 g/dL   RDW 01.6 (H) 01.0 - 93.2 %   Platelets 602 (H) 150 - 400 K/uL   nRBC 1.3 (H) 0.0 - 0.2 %    Comment: Performed at [facility], [address] [place], [place] [address]  Troponin I (High Sensitivity)     Status: Abnormal   Collection Time: 11/11/22  3:25 PM  Result Value Ref Range   Troponin I (High Sensitivity) 39 (H) <18 ng/L    Comment: (NOTE) Elevated high sensitivity troponin I (hsTnI) values and significant  changes across serial measurements may suggest ACS but many other  chronic and acute conditions are known to elevate hsTnI results.  Refer to the "Links" section for chest pain algorithms and additional  guidance. Performed at [facility], [address], [place], [place] [address]   Troponin I (High Sensitivity)     Status: Abnormal   Collection Time: 11/11/22  5:13 PM  Result Value Ref Range   Troponin I (High Sensitivity) 37 (H) <18 ng/L    Comment: (NOTE) Elevated high sensitivity troponin I (hsTnI) values and significant  changes across serial measurements may suggest  ACS but many other  chronic and acute conditions are known to elevate hsTnI results.  Refer to the "Links" section for chest pain algorithms and additional  guidance. Performed at [facility], [address], [place], [place] [address]    CT Angio Chest PE W and/or Wo Contrast  Result Date: 11/11/2022 CLINICAL DATA:  Pulmonary embolism (PE) suspected, high prob; Abdominal pain, acute, nonlocalized EXAM: CT ANGIOGRAPHY CHEST CT ABDOMEN AND PELVIS WITH CONTRAST TECHNIQUE: Multidetector CT imaging of the chest was performed using the standard protocol during bolus administration of intravenous contrast. Multiplanar CT image reconstructions and MIPs were obtained to evaluate the vascular anatomy. Multidetector CT imaging of the abdomen and pelvis was performed using the standard protocol during bolus administration of intravenous contrast. RADIATION DOSE REDUCTION: This exam was performed according to the departmental dose-optimization program which includes automated exposure control, adjustment of the mA and/or kV according to patient size and/or use of iterative reconstruction technique. CONTRAST:  75mL OMNIPAQUE IOHEXOL 350 MG/ML SOLN COMPARISON:  CT chest 11/09/2022 FINDINGS: CTA CHEST FINDINGS Cardiovascular: Satisfactory opacification of the pulmonary arteries to the segmental level. No evidence of pulmonary embolism. Normal heart size. No significant pericardial effusion. Status post aortic valve replacement. The thoracic aorta is normal in caliber. No atherosclerotic plaque of the thoracic aorta. At least 2 vessel coronary artery calcifications. Left anterior descending coronary artery stent. Mediastinum/Nodes: Stable 6.4 x 3.7 cm prevascular fluid and gas collection likely representing a hematoma status post median sternotomy and aortic valve repair. No active extravasation of intravenous contrast or increased density within the collection. No interval peripheral enhancement to suggest abscess  formation. No enlarged mediastinal, hilar, or axillary lymph nodes. Thyroid gland, trachea, and esophagus demonstrate no significant findings. Lungs/Pleura: No focal consolidation. No pulmonary nodule. No pulmonary mass. Stable bilateral small pleural effusions. No pneumothorax. Musculoskeletal: Similar-appearing gas and fluid collection along the anterior aspect of the manubrium and manubrium clavicular joints which appears to possibly be contiguous with the pre-vascular collection. No cortical erosion or destruction. No suspicious lytic or blastic osseous lesions. No acute displaced fracture. Multilevel degenerative changes  of the spine. Intact sternotomy wires. Review of the MIP images confirms the above findings. CT ABDOMEN and PELVIS FINDINGS Hepatobiliary: The hepatic parenchyma is diffusely hypodense compared to the splenic parenchyma consistent with fatty infiltration. No focal liver abnormality. No gallstones, gallbladder wall thickening, or pericholecystic fluid. No biliary dilatation. Pancreas: No focal lesion. Normal pancreatic contour. No surrounding inflammatory changes. No main pancreatic ductal dilatation. Spleen: Normal in size without focal abnormality. Adrenals/Urinary Tract: No adrenal nodule bilaterally. Bilateral kidneys enhance symmetrically. No hydronephrosis. No hydroureter. The urinary bladder is unremarkable. Stomach/Bowel: Stomach is within normal limits. No evidence of bowel wall thickening or dilatation. Appendix appears normal. Vascular/Lymphatic: No abdominal aorta or iliac aneurysm. No abdominal, pelvic, or inguinal lymphadenopathy. Reproductive: Prostate is unremarkable. Other: No intraperitoneal free fluid. No intraperitoneal free gas. No organized fluid collection. Musculoskeletal: No abdominal wall hernia or abnormality. No suspicious lytic or blastic osseous lesions. No acute displaced fracture. Multilevel degenerative changes of the spine. Review of the MIP images confirms the  above findings. IMPRESSION: 1. Stable in size and appearance of a 6.4 x 3.7 cm prevascular fluid and gas collection likely representing a hematoma status post median sternotomy and aortic valve repair. Similar-appearing gas and fluid collection along the anterior aspect of the manubrium and manubrium clavicular joints which appears to possibly be contiguous with the pre-vascular collection. No active extravasation of intravenous contrast or increased density within the collection to suggest hemorrhage. No interval peripheral enhancement to suggest abscess formation; although, infection of the sternotomy and anterior mediastinum is not excluded. No cortical erosion or destruction of the sternum to suggest osteomyelitis. Sternotomy wires are intact. 2. Bilateral small volume pleural effusions. 3. No acute intra-abdominal or intrapelvic abnormality. Electronically Signed   By: Tish Frederickson M.D.   On: 11/11/2022 20:40   CT ABDOMEN PELVIS W CONTRAST  Result Date: 11/11/2022 CLINICAL DATA:  Pulmonary embolism (PE) suspected, high prob; Abdominal pain, acute, nonlocalized EXAM: CT ANGIOGRAPHY CHEST CT ABDOMEN AND PELVIS WITH CONTRAST TECHNIQUE: Multidetector CT imaging of the chest was performed using the standard protocol during bolus administration of intravenous contrast. Multiplanar CT image reconstructions and MIPs were obtained to evaluate the vascular anatomy. Multidetector CT imaging of the abdomen and pelvis was performed using the standard protocol during bolus administration of intravenous contrast. RADIATION DOSE REDUCTION: This exam was performed according to the departmental dose-optimization program which includes automated exposure control, adjustment of the mA and/or kV according to patient size and/or use of iterative reconstruction technique. CONTRAST:  75mL OMNIPAQUE IOHEXOL 350 MG/ML SOLN COMPARISON:  CT chest 11/09/2022 FINDINGS: CTA CHEST FINDINGS Cardiovascular: Satisfactory opacification of  the pulmonary arteries to the segmental level. No evidence of pulmonary embolism. Normal heart size. No significant pericardial effusion. Status post aortic valve replacement. The thoracic aorta is normal in caliber. No atherosclerotic plaque of the thoracic aorta. At least 2 vessel coronary artery calcifications. Left anterior descending coronary artery stent. Mediastinum/Nodes: Stable 6.4 x 3.7 cm prevascular fluid and gas collection likely representing a hematoma status post median sternotomy and aortic valve repair. No active extravasation of intravenous contrast or increased density within the collection. No interval peripheral enhancement to suggest abscess formation. No enlarged mediastinal, hilar, or axillary lymph nodes. Thyroid gland, trachea, and esophagus demonstrate no significant findings. Lungs/Pleura: No focal consolidation. No pulmonary nodule. No pulmonary mass. Stable bilateral small pleural effusions. No pneumothorax. Musculoskeletal: Similar-appearing gas and fluid collection along the anterior aspect of the manubrium and manubrium clavicular joints which appears to possibly be contiguous with  the pre-vascular collection. No cortical erosion or destruction. No suspicious lytic or blastic osseous lesions. No acute displaced fracture. Multilevel degenerative changes of the spine. Intact sternotomy wires. Review of the MIP images confirms the above findings. CT ABDOMEN and PELVIS FINDINGS Hepatobiliary: The hepatic parenchyma is diffusely hypodense compared to the splenic parenchyma consistent with fatty infiltration. No focal liver abnormality. No gallstones, gallbladder wall thickening, or pericholecystic fluid. No biliary dilatation. Pancreas: No focal lesion. Normal pancreatic contour. No surrounding inflammatory changes. No main pancreatic ductal dilatation. Spleen: Normal in size without focal abnormality. Adrenals/Urinary Tract: No adrenal nodule bilaterally. Bilateral kidneys enhance  symmetrically. No hydronephrosis. No hydroureter. The urinary bladder is unremarkable. Stomach/Bowel: Stomach is within normal limits. No evidence of bowel wall thickening or dilatation. Appendix appears normal. Vascular/Lymphatic: No abdominal aorta or iliac aneurysm. No abdominal, pelvic, or inguinal lymphadenopathy. Reproductive: Prostate is unremarkable. Other: No intraperitoneal free fluid. No intraperitoneal free gas. No organized fluid collection. Musculoskeletal: No abdominal wall hernia or abnormality. No suspicious lytic or blastic osseous lesions. No acute displaced fracture. Multilevel degenerative changes of the spine. Review of the MIP images confirms the above findings. IMPRESSION: 1. Stable in size and appearance of a 6.4 x 3.7 cm prevascular fluid and gas collection likely representing a hematoma status post median sternotomy and aortic valve repair. Similar-appearing gas and fluid collection along the anterior aspect of the manubrium and manubrium clavicular joints which appears to possibly be contiguous with the pre-vascular collection. No active extravasation of intravenous contrast or increased density within the collection to suggest hemorrhage. No interval peripheral enhancement to suggest abscess formation; although, infection of the sternotomy and anterior mediastinum is not excluded. No cortical erosion or destruction of the sternum to suggest osteomyelitis. Sternotomy wires are intact. 2. Bilateral small volume pleural effusions. 3. No acute intra-abdominal or intrapelvic abnormality. Electronically Signed   By: Tish Frederickson M.D.   On: 11/11/2022 20:40   DG Chest 2 View  Result Date: 11/11/2022 CLINICAL DATA:  Status post AVR this week. Shortness of breath and fatigue. Fall 2 days ago. EXAM: CHEST - 2 VIEW COMPARISON:  None Available. FINDINGS: The heart is mildly enlarged with prior sternotomy. Prosthetic aortic valve is noted. Left basilar opacity suggesting atelectasis and/or  small effusion. The visualized skeletal structures are unremarkable. IMPRESSION: Left basilar opacity suggesting atelectasis and/or small effusion. Stable mild cardiomegaly. Electronically Signed   By: Larose Hires D.O.   On: 11/11/2022 13:56    Pending Labs Unresulted Labs (From admission, onward)     Start     Ordered   11/18/22 0500  Creatinine, serum  (enoxaparin (LOVENOX)    CrCl >/= 30 ml/min)  Weekly,   R     Comments: while on enoxaparin therapy    11/11/22 2122   11/11/22 2116  CBC  (enoxaparin (LOVENOX)    CrCl >/= 30 ml/min)  Once,   R       Comments: Baseline for enoxaparin therapy IF NOT ALREADY DRAWN.  Notify MD if PLT < 100 K.    11/11/22 2122   11/11/22 2116  Creatinine, serum  (enoxaparin (LOVENOX)    CrCl >/= 30 ml/min)  Once,   R       Comments: Baseline for enoxaparin therapy IF NOT ALREADY DRAWN.    11/11/22 2122            Vitals/Pain Today's Vitals   11/11/22 1930 11/11/22 1945 11/11/22 1954 11/11/22 2030  BP: 123/75     Pulse:  85  Resp:      Temp:   98.2 F (36.8 C)   TempSrc:   Oral   SpO2:  93%  92%  Weight:      Height:      PainSc:        Isolation Precautions No active isolations  Medications Medications  atorvastatin (LIPITOR) tablet 40 mg (has no administration in time range)  aspirin EC tablet 325 mg (has no administration in time range)  DULoxetine (CYMBALTA) DR capsule 120 mg (has no administration in time range)  traZODone (DESYREL) tablet 50 mg (has no administration in time range)  insulin detemir (LEVEMIR) injection 20 Units (has no administration in time range)  insulin aspart (novoLOG) injection 0-24 Units (has no administration in time range)  famotidine (PEPCID) tablet 40 mg (has no administration in time range)  gabapentin (NEURONTIN) capsule 300 mg (has no administration in time range)  mometasone-formoterol (DULERA) 200-5 MCG/ACT inhaler 2 puff (has no administration in time range)  enoxaparin (LOVENOX) injection 40  mg (has no administration in time range)  acetaminophen (TYLENOL) tablet 650 mg (has no administration in time range)    Or  acetaminophen (TYLENOL) suppository 650 mg (has no administration in time range)  oxyCODONE (Oxy IR/ROXICODONE) immediate release tablet 5 mg (has no administration in time range)  ondansetron (ZOFRAN) tablet 4 mg (has no administration in time range)    Or  ondansetron (ZOFRAN) injection 4 mg (has no administration in time range)  fentaNYL (SUBLIMAZE) injection 50 mcg (50 mcg Intravenous Given 11/11/22 1833)  metoCLOPramide (REGLAN) injection 10 mg (10 mg Intravenous Given 11/11/22 1833)  lactated ringers bolus 500 mL (0 mLs Intravenous Stopped 11/11/22 2032)  diphenhydrAMINE (BENADRYL) injection 12.5 mg (12.5 mg Intravenous Given 11/11/22 1833)  acetaminophen (TYLENOL) tablet 1,000 mg (1,000 mg Oral Given 11/11/22 1834)  iohexol (OMNIPAQUE) 350 MG/ML injection 75 mL (75 mLs Intravenous Contrast Given 11/11/22 2006)    Mobility walks     Focused Assessments See providers note from ER   R Recommendations: See Admitting Provider Note  Report given to:   Additional Notes:  Call or epic message for any additional details

## 2022-11-11 NOTE — ED Triage Notes (Signed)
Pt endorses generalized CP since having surgery on the 14th. States he thinks they sent him home too early. Seen at [facility] on 24th and sent home. Went to doc today and they are worried about failure to thrive. Pt states his wife can't take care of him at home.

## 2022-11-11 NOTE — Patient Instructions (Signed)
Medication changes as discussed.  Increase activities as able.  Stay hydrated.  Blood sugars.  Laxatives as needed.

## 2022-11-11 NOTE — H&P (Signed)
History and Physical    GEOVANIE Marshall MVH:[id number] DOB: [birth year] DOA: 11/11/2022  PCP: Ethelda Chick, MD   Chief Complaint: cp  HPI: Brett Marshall is a 57 y.o. male with medical history significant of CAD, diabetes, heart failure, hyperlipidemia, morbid obesity, pulmonary hypertension, aortic stenosis status post replacement who presented to the emergency department due to chest discomfort.  Patient known history of CAD and had heart catheterization 23 with proximal LAD stent.  He was continued to have chest pain.  Echocardiogram showed aortic stenosis and patient symptoms were thought to be due to bicuspid aortic valve.  He was thought to undergo surgical replacement.  He presented on 6/14 and was taken to the OR for aortic valve replacement.  He tolerated the procedure well and had a largely uncomplicated postoperative course.  He was eventually discharged however Brett Marshall presented to the emergency department on 6/24 with chest pain shortness of breath and weakness.  He was having sharp pain since his surgery.  Workup at the outside hospital was largely unrevealing.  CT surgery was consulted during that presentation and recommended outpatient follow-up.  Patient Brett Marshall presented today due to shortness of breath.  He had a CT on 6/24 which showed a postoperative hematoma.  He was seen in clinic with CT surgery and was sent to the ER due to concerns of failure to thrive and patient being unable to take care for himself.  On arrival to the emergency department he was afebrile and hemodynamically stable.  Labs were obtained which revealed WBC 15.9, hemoglobin 8.7, platelets 602.  BMP showed mild AKI with creatinine 1.5 baseline around 1.  Troponins were obtained 39, 37.  Patient underwent chest x-ray which showed left basilar opacity suggesting possible atelectasis versus effusion.  CT angiogram chest demonstrated 6 cm fluid collection representing a postoperative hematoma and bilateral pleural  effusions.  CT abdomen pelvis showed no acute pathology.  Patient was admitted for further workup.   Review of Systems: Review of Systems  All other systems reviewed and are negative.    As per HPI otherwise 10 point review of systems negative.   No Known Allergies  Past Medical History:  Diagnosis Date   Asthma    CAD (coronary artery disease)    a. 08/2017 Cath: Diff nonobs dzs, EF 45-50%, diff HK; b. 08/2021 MV: Ant/apical ishemia; c. 08/2021 PCI: LAD 95ost (4.0x15 Onyx Frontier DES), EF 55-65%; c. 09/2021 Cath: LM nl, LAD patent stent, 18m, 30d, RI min irregs, LCX min irregs, RCA 50p. LVOT grad 50-41mmHg (rest), 70-140mmHg (provoked).   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 08/2017 LV gram: EF 45-50%, glob HK; b. 08/2021 Echo: EF 60-65%, no rwma, GrI DD, nl RV fxn; c. 09/2021 cMRI: EF 60%, no LVOT obstruction/gradient. Mild conc LVH, mild AS. No myocardial scar/fibrosis. No evidence of HCM.   Hyperlipidemia    Leg swelling    Left   Low back pain    Mixed ICM & NICM    a. 08/2017 LV gram: EF 45-50%, glob HK; b. 08/2021 Echo: EF 60-65%.   Morbid obesity (HCC)    OSA on CPAP    non compliant   Pulmonary hypertension (HCC)  Past Surgical History:  Procedure Laterality Date   AORTIC ROOT ENLARGEMENT N/A 10/30/2022   Procedure: AORTIC ROOT ENLARGEMENT AND SUBAORTIC MYOMECTOMY;  Surgeon: Eugenio Hoes, MD;  Location: [facility] OR;  Service: Open Heart Surgery;  Laterality: N/A;   AORTIC VALVE REPLACEMENT N/A 10/30/2022   Procedure: AORTIC VALVE REPLACEMENT (AVR) USING INSPIRIS RESILIA AORTIC VALVE;  Surgeon: Eugenio Hoes, MD;  Location: [facility] OR;  Service: Open Heart Surgery;  Laterality: N/A;  Median sternotomy   CARDIAC CATHETERIZATION     [place] no  stents.   COLONOSCOPY WITH PROPOFOL N/A 04/11/2019   Procedure: COLONOSCOPY WITH PROPOFOL;  Surgeon: Midge Minium, MD;  Location: [facility] ENDOSCOPY;  Service: Endoscopy;  Laterality: N/A;   CORONARY STENT INTERVENTION N/A 09/08/2021   Procedure: CORONARY STENT INTERVENTION;  Surgeon: [name], MD;  Location: [facility] INVASIVE CV LAB;  Service: Cardiovascular;  Laterality: N/A;   CORONARY ULTRASOUND/IVUS N/A 09/08/2021   Procedure: Intravascular Ultrasound/IVUS;  Surgeon: [name], MD;  Location: [facility] INVASIVE CV LAB;  Service: Cardiovascular;  Laterality: N/A;   LEFT HEART CATH AND CORONARY ANGIOGRAPHY N/A 09/08/2021   Procedure: LEFT HEART CATH AND CORONARY ANGIOGRAPHY;  Surgeon: [name], MD;  Location: [facility] INVASIVE CV LAB;  Service: Cardiovascular;  Laterality: N/A;   LEFT HEART CATH AND CORONARY ANGIOGRAPHY N/A 09/17/2021   Procedure: LEFT HEART CATH AND CORONARY ANGIOGRAPHY;  Surgeon: Yvonne Kendall, MD;  Location: [facility] INVASIVE CV LAB;  Service: Cardiovascular;  Laterality: N/A;   LEFT HEART CATH AND CORONARY ANGIOGRAPHY N/A 09/08/2022   Procedure: LEFT HEART CATH AND CORONARY ANGIOGRAPHY;  Surgeon: Yvonne Kendall, MD;  Location: [facility] INVASIVE CV LAB;  Service: Cardiovascular;  Laterality: N/A;   RIGHT/LEFT HEART CATH AND CORONARY ANGIOGRAPHY N/A 09/02/2017   Procedure: RIGHT/LEFT HEART CATH AND CORONARY ANGIOGRAPHY;  Surgeon: [name], MD;  Location: [facility] INVASIVE CV LAB;  Service: Cardiovascular;  Laterality: N/A;   RIGHT/LEFT HEART CATH AND CORONARY ANGIOGRAPHY Bilateral 10/19/2022   Procedure: RIGHT/LEFT HEART CATH AND CORONARY ANGIOGRAPHY;  Surgeon: [name], MD;  Location: [facility] INVASIVE CV LAB;  Service: Cardiovascular;  Laterality: Bilateral;   TEE WITHOUT CARDIOVERSION N/A 09/10/2022   Procedure: TRANSESOPHAGEAL ECHOCARDIOGRAM;  Surgeon: Antonieta Iba, MD;  Location: [facility] ORS;  Service: Cardiovascular;  Laterality: N/A;   TEE WITHOUT CARDIOVERSION  N/A 10/30/2022   Procedure: TRANSESOPHAGEAL ECHOCARDIOGRAM;  Surgeon: Eugenio Hoes, MD;  Location: [facility] OR;  Service: Open Heart Surgery;  Laterality: N/A;     reports that he has never smoked. He has never used smokeless tobacco. He reports that he does not currently use alcohol. He reports that he does not use drugs.  Family History  Problem Relation Age of Onset   Diabetes Mother    Asthma Mother    Heart disease Father    Heart attack Father    Sjogren's syndrome Sister    Heart disease Brother 29       CAD/CABG    Prior to Admission medications   Medication Sig Start Date End Date Taking? Authorizing Provider  acetaminophen (TYLENOL) 325 MG tablet Take 650 mg by mouth every 6 (six) hours as needed for moderate pain.   Yes [provider]  albuterol (VENTOLIN HFA) 108 (90 Base) MCG/ACT inhaler Inhale 1-2 puffs into the lungs every 6 (six) hours as needed for wheezing or shortness of breath. 09/11/20  Yes Glenford Bayley, NP  aspirin EC 325 MG tablet Take 1 tablet (325 mg total) by mouth daily. 11/05/22  Yes Doree Fudge M, PA-C  atorvastatin (LIPITOR) 40 MG tablet Take 1 tablet (40 mg total) by mouth daily at 6 PM. 11/05/22  Yes Doree Fudge M, PA-C  cyclobenzaprine (FLEXERIL) 10 MG tablet Take 1 tablet (10 mg total) by mouth 3 (three) times daily as needed.For spasms Patient taking differently: Take 30 mg by mouth daily as needed for muscle spasms. For spasms 11/20/17  Yes Ethelda Chick, MD  DULoxetine (CYMBALTA) 60 MG capsule Take 120 mg by mouth at bedtime. 08/10/22  Yes [provider]  famotidine (PEPCID) 40 MG tablet Take 40 mg by mouth at bedtime. 08/14/18  Yes [provider]  fluticasone (FLONASE) 50 MCG/ACT nasal spray Place 1 spray into both nostrils daily as needed for allergies or rhinitis.   Yes [provider]  fluticasone-salmeterol (ADVAIR) 250-50 MCG/ACT AEPB Inhale 1 puff into the lungs every 12 (twelve) hours. 01/29/22   Yes Glenford Bayley, NP  gabapentin (NEURONTIN) 300 MG capsule Take 300 mg by mouth at bedtime. 09/04/22 09/04/23 Yes [provider]  insulin glargine (LANTUS) 100 unit/mL SOPN Inject 30 Units into the skin daily. 11/05/22  Yes Joycelyn Man, Donielle M, PA-C  JARDIANCE 25 MG TABS tablet Take 25 mg by mouth daily. 06/06/20  Yes [provider]  lidocaine (LIDODERM) 5 % Place 1 patch onto the skin daily. Remove & Discard patch within 12 hours or as directed by MD. May place on right or left side of chest Patient taking differently: Place 1 patch onto the skin daily as needed (pain). Remove & Discard patch within 12 hours or as directed by MD. May place on right or left side of chest 11/05/22  Yes Doree Fudge M, PA-C  metFORMIN (GLUCOPHAGE) 1000 MG tablet Take 1 tablet (1,000 mg total) by mouth 2 (two) times daily with a meal. 12/09/17  Yes Ethelda Chick, MD  oxyCODONE (OXY IR/ROXICODONE) 5 MG immediate release tablet Take 1 tablet (5 mg total) by mouth every 6 (six) hours as needed for severe pain. 11/05/22  Yes Doree Fudge M, PA-C  potassium chloride SA (KLOR-CON M) 20 MEQ tablet Take 1 tablet (20 mEq total) by mouth daily. For 10 days then stop. 11/05/22  Yes Doree Fudge M, PA-C  traZODone (DESYREL) 50 MG tablet Take 50 mg by mouth at bedtime. 08/19/21  Yes [provider]  ferrous sulfate 325 (65 FE) MG EC tablet Take 1 tablet (325 mg total) by mouth daily with breakfast. 11/05/22 11/05/23  Ardelle Balls, PA-C  furosemide (LASIX) 40 MG tablet Take 1 tablet (40 mg total) by mouth daily. For 10 days then stop. 11/05/22   Doree Fudge M, PA-C  glucose blood (GE100 BLOOD GLUCOSE TEST) test strip Use as instructed 02/03/16   Ethelda Chick, MD  guaiFENesin (MUCINEX) 600 MG 12 hr tablet Take 1 tablet (600 mg total) by mouth 2 (two) times daily as needed. Patient not taking: Reported on 11/11/2022 11/05/22   Ardelle Balls, PA-C  Lancets [facility]  ULTRASOFT) lancets Check sugar twice daily 01/11/16   Ethelda Chick, MD  metoprolol tartrate (LOPRESSOR) 25 MG tablet Take 1 tablet (25 mg total) by mouth 2 (two) times daily. 11/05/22   Ardelle Balls, PA-C    Physical Exam: Vitals:   11/11/22 1945 11/11/22 1954 11/11/22 2030 11/11/22 2258  BP:    111/74  Pulse:   85 83  Resp:    16  Temp:  98.2 F (36.8 C)  98.5 F (36.9  C)  TempSrc:  Oral  Oral  SpO2: 93%  92% 96%  Weight:      Height:       Physical Exam Vitals reviewed.  Constitutional:      Appearance: He is obese.  HENT:     Head: Normocephalic.  Cardiovascular:     Rate and Rhythm: Normal rate and regular rhythm.     Heart sounds: Normal heart sounds.  Pulmonary:     Effort: Pulmonary effort is normal.     Breath sounds: Normal breath sounds.  Abdominal:     General: Bowel sounds are normal.     Palpations: Abdomen is soft.  Musculoskeletal:        General: Normal range of motion.     Cervical back: Normal range of motion.  Skin:    General: Skin is warm.     Capillary Refill: Capillary refill takes less than 2 seconds.  Neurological:     General: No focal deficit present.  Psychiatric:        Mood and Affect: Mood normal.       Labs on Admission: I have personally reviewed the patients's labs and imaging studies.  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 Observation status is judged to be reasonable and necessary in order to provide the required intensity of service to ensure the patient's safety. The patient's presenting symptoms, physical exam findings, and initial radiographic and laboratory data in the context of their medical condition is felt to place them at decreased risk for further clinical deterioration. Furthermore, it is anticipated that the patient will be medically stable for discharge from the hospital within 2 midnights of admission.     Alan Mulder MD [facility] If 7PM-7AM, please contact night-coverage [email or web address]  11/11/2022, 11:00 PM

## 2022-11-11 NOTE — Progress Notes (Signed)
[address]       [place] [address]             [phone]      ZENAS SANTA 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  He reports that he was seen in the ER on Monday after a fall.  He did have a chest CT which showed some findings consistent with hematoma that was reviewed by Dr. Leafy Ro.  In general he has having a very difficult time with his postoperative recovery at home.  He is weak.  He reports a poor appetite but is drinking fluids.  He has not been checking his blood sugars.  He does have some occasional dizziness/lightheadedness.  He has had 2 bowel movements since surgery.  He has not had any fevers or chills, but does feel warm at times.  Incisions have had had no drainage or erythema associated with them.  He has not had symptoms of sternal instability.  Fall was more in the right shoulder region than central at the sternotomy.  He has not been very active and this is related to discomfort as well as his morbid obesity.  He does have a significant other who  assists him but is also difficult due to his size.  The initial reason he called EMS was because he could not get off the floor and she could not assist him.  The overall picture here is a failure to thrive in the postoperative setting.      Past Medical History:  Diagnosis Date   Asthma    CAD (coronary artery disease)    a. 08/2017 Cath: Diff nonobs dzs, EF 45-50%, diff HK; b. 08/2021 MV: Ant/apical ishemia; c. 08/2021 PCI: LAD 95ost (4.0x15 Onyx Frontier DES), EF 55-65%; c. 09/2021 Cath: LM nl, LAD patent stent, 55m, 30d, RI min irregs, LCX min irregs, RCA 50p. LVOT grad 50-40mmHg (rest), 70-139mmHg (provoked).   DDD (degenerative disc disease), lumbar    Depression    Diabetes mellitus without complication (HCC)    Diarrhea    Essential hypertension, benign    Headache    Heart murmur    pt has had ECHO   HFimpEF (heart failure with improved ejection  fraction) (HCC)    a. 08/2017 LV gram: EF 45-50%, glob HK; b. 08/2021 Echo: EF 60-65%, no rwma, GrI DD, nl RV fxn; c. 09/2021 cMRI: EF 60%, no LVOT obstruction/gradient. Mild conc LVH, mild AS. No myocardial scar/fibrosis. No evidence of HCM.   Hyperlipidemia    Leg swelling    Left   Low back pain    Mixed ICM & NICM    a. 08/2017 LV gram: EF 45-50%, glob HK; b. 08/2021 Echo: EF 60-65%.   Morbid obesity (HCC)    OSA on CPAP    non compliant   Pulmonary hypertension (HCC)      Social History   Tobacco Use  Smoking Status Never  Smokeless Tobacco Never    Social History   Substance and Sexual Activity  Alcohol Use Not Currently     No Known Allergies  Current Outpatient Medications  Medication Sig Dispense Refill   acetaminophen (TYLENOL) 325 MG tablet Take 650 mg by mouth every 6 (six) hours as needed for moderate pain.     albuterol (VENTOLIN HFA) 108 (90 Base) MCG/ACT inhaler Inhale 1-2 puffs into the lungs every 6 (six) hours as needed for wheezing or shortness of breath. 18 g 1   aspirin EC 325 MG tablet Take 1 tablet  (325 mg total) by mouth daily.     atorvastatin (LIPITOR) 40 MG tablet Take 1 tablet (40 mg total) by mouth daily at 6 PM. 30 tablet 2   cyclobenzaprine (FLEXERIL) 10 MG tablet Take 1 tablet (10 mg total) by mouth 3 (three) times daily as needed.For spasms (Patient taking differently: Take 30 mg by mouth every evening. For spasms) 90 tablet 5   DULoxetine (CYMBALTA) 60 MG capsule Take 120 mg by mouth at bedtime.     famotidine (PEPCID) 40 MG tablet Take 40 mg by mouth at bedtime.     ferrous sulfate 325 (65 FE) MG EC tablet Take 1 tablet (325 mg total) by mouth daily with breakfast. 30 tablet 0   fluticasone (FLONASE) 50 MCG/ACT nasal spray Place 1 spray into both nostrils daily as needed for allergies or rhinitis.     fluticasone-salmeterol (ADVAIR) 250-50 MCG/ACT AEPB Inhale 1 puff into the lungs every 12 (twelve) hours. 60 each 6   furosemide (LASIX) 40 MG tablet Take 1 tablet (40 mg total) by mouth daily. For 10 days then stop. 10 tablet 0   gabapentin (NEURONTIN) 300 MG capsule Take 1 capsule by mouth at bedtime.     glucose blood (GE100 BLOOD GLUCOSE TEST) test strip Use as instructed 100 each 12   guaiFENesin (MUCINEX) 600 MG 12 hr tablet Take 1 tablet (600 mg total) by mouth 2 (two) times daily as needed.     insulin glargine (LANTUS) 100 unit/mL SOPN Inject 30 Units into the skin daily. 15 mL 11   JARDIANCE 25 MG TABS tablet Take 25 mg by mouth daily.     Lancets (ONETOUCH ULTRASOFT) lancets Check sugar twice daily 100 each 12   lidocaine (LIDODERM) 5 % Place 1 patch onto the skin daily. Remove & Discard patch within 12 hours or as directed by MD. May place on right or left side of chest 14 patch 0   metFORMIN (GLUCOPHAGE) 1000 MG tablet Take 1 tablet (1,000 mg total) by mouth 2 (two) times daily with a meal. 180 tablet 1   metoprolol tartrate (LOPRESSOR) 25 MG tablet Take 1 tablet (25 mg total) by mouth 2 (two) times daily. 60 tablet 1  oxyCODONE (OXY IR/ROXICODONE) 5 MG immediate  release tablet Take 1 tablet (5 mg total) by mouth every 6 (six) hours as needed for severe pain. 30 tablet 0   potassium chloride SA (KLOR-CON M) 20 MEQ tablet Take 1 tablet (20 mEq total) by mouth daily. For 10 days then stop. 10 tablet 0   traZODone (DESYREL) 50 MG tablet Take 50 mg by mouth at bedtime.     No current facility-administered medications for this visit.       Physical Exam: BP (!) 89/67   Pulse 72   Resp 18   Ht 5\' 8"  (1.727 m)   Wt (!) 317 lb (143.8 kg)   SpO2 98% Comment: RA  BMI 48.20 kg/m   General appearance: alert, cooperative, fatigued, and no distress Heart: regular rate and rhythm Lungs: Minimally diminished in the bases Abdomen: Obese Extremities: Mild edema, some of which is chronic Wound: Incision healing well without evidence of infection.  There is no sternal click, drainage or erythema   Diagnostic Studies & Laboratory data:     Recent Radiology Findings:   DG Chest 2 View  Result Date: 11/11/2022 CLINICAL DATA:  Status post AVR this week. Shortness of breath and fatigue. Fall 2 days ago. EXAM: CHEST - 2 VIEW COMPARISON:  None Available. FINDINGS: The heart is mildly enlarged with prior sternotomy. Prosthetic aortic valve is noted. Left basilar opacity suggesting atelectasis and/or small effusion. The visualized skeletal structures are unremarkable. IMPRESSION: Left basilar opacity suggesting atelectasis and/or small effusion. Stable mild cardiomegaly. Electronically Signed   By: Larose Hires D.O.   On: 11/11/2022 13:56      Recent Lab Findings: Lab Results  Component Value Date   WBC 15.7 (H) 11/09/2022   HGB 8.2 (L) 11/09/2022   HCT 26.7 (L) 11/09/2022   PLT 517 (H) 11/09/2022   GLUCOSE 143 (H) 11/09/2022   CHOL 109 09/08/2022   TRIG 138 09/08/2022   HDL 31 (L) 09/08/2022   LDLCALC 50 09/08/2022   ALT 17 10/28/2022   AST 24 10/28/2022   NA 134 (L) 11/09/2022   K 4.3 11/09/2022   CL 97 (L) 11/09/2022   CREATININE 1.41 (H)  11/09/2022   BUN 21 (H) 11/09/2022   CO2 23 11/09/2022   TSH 2.82 02/08/2016   INR 1.6 (H) 10/30/2022   HGBA1C 10.0 (H) 10/28/2022      Assessment / Plan: Overall appears to be a situation where he is just having a difficult time taking care of himself even with his significant other.  I encouraged him to try to do some activities with assistance and we are trying to attempt to arrange some home care including PT.  I discussed the situation with Dr. Leafy Ro.  He recommended stopping both Lasix and metoprolol which I discussed with the patient and they will do.  I also encouraged him to try to  drink fluids so he does not get dehydrated.  His significant other can check blood sugars and is agreed to do so.  Continues to feel poorly we have asked him to go to the emergency room where he can be potentially admitted and started on a process to where he could go to a skilled nursing facility type environment if he is unable to continue to manage on his own.      Medication Changes: No orders of the defined types were placed in this encounter.     Rowe Clack [facility]  11/11/2022 2:46 PM

## 2022-11-12 ENCOUNTER — Encounter: Payer: Self-pay | Admitting: Thoracic Surgery (Cardiothoracic Vascular Surgery)

## 2022-11-12 ENCOUNTER — Telehealth: Payer: Self-pay

## 2022-11-12 ENCOUNTER — Other Ambulatory Visit: Payer: Self-pay | Admitting: *Deleted

## 2022-11-12 DIAGNOSIS — R627 Adult failure to thrive: Secondary | ICD-10-CM | POA: Diagnosis not present

## 2022-11-12 LAB — CBC
HCT: 30.2 % — ABNORMAL LOW (ref 39.0–52.0)
Hemoglobin: 9.1 g/dL — ABNORMAL LOW (ref 13.0–17.0)
MCH: 27.1 pg (ref 26.0–34.0)
MCHC: 30.1 g/dL (ref 30.0–36.0)
MCV: 89.9 fL (ref 80.0–100.0)
Platelets: 548 10*3/uL — ABNORMAL HIGH (ref 150–400)
RBC: 3.36 MIL/uL — ABNORMAL LOW (ref 4.22–5.81)
RDW: 15.9 % — ABNORMAL HIGH (ref 11.5–15.5)
WBC: 11 10*3/uL — ABNORMAL HIGH (ref 4.0–10.5)
nRBC: 0.5 % — ABNORMAL HIGH (ref 0.0–0.2)

## 2022-11-12 LAB — VITAMIN D 25 HYDROXY (VIT D DEFICIENCY, FRACTURES): Vit D, 25-Hydroxy: 8.02 ng/mL — ABNORMAL LOW (ref 30–100)

## 2022-11-12 LAB — GLUCOSE, CAPILLARY
Glucose-Capillary: 148 mg/dL — ABNORMAL HIGH (ref 70–99)
Glucose-Capillary: 155 mg/dL — ABNORMAL HIGH (ref 70–99)
Glucose-Capillary: 174 mg/dL — ABNORMAL HIGH (ref 70–99)
Glucose-Capillary: 176 mg/dL — ABNORMAL HIGH (ref 70–99)

## 2022-11-12 LAB — CORTISOL: Cortisol, Plasma: 16.9 ug/dL

## 2022-11-12 LAB — VITAMIN B12: Vitamin B-12: 212 pg/mL (ref 180–914)

## 2022-11-12 LAB — CREATININE, SERUM
Creatinine, Ser: 1.48 mg/dL — ABNORMAL HIGH (ref 0.61–1.24)
GFR, Estimated: 55 mL/min — ABNORMAL LOW (ref >60)

## 2022-11-12 LAB — TSH: TSH: 7.559 u[IU]/mL — ABNORMAL HIGH (ref 0.350–4.500)

## 2022-11-12 MED ORDER — METOPROLOL TARTRATE 5 MG/5ML IV SOLN
5.0000 mg | INTRAVENOUS | Status: DC | PRN
  Administered 2022-11-13 – 2022-11-17 (×5): 5 mg via INTRAVENOUS
  Filled 2022-11-12 (×6): qty 5

## 2022-11-12 MED ORDER — LACTATED RINGERS IV SOLN: INTRAVENOUS | Status: DC

## 2022-11-12 MED ORDER — LACTATED RINGERS IV SOLN: INTRAVENOUS | Status: AC

## 2022-11-12 MED ORDER — SENNOSIDES-DOCUSATE SODIUM 8.6-50 MG PO TABS: 1.0000 | ORAL_TABLET | Freq: Every evening | ORAL | Status: DC | PRN

## 2022-11-12 MED ORDER — TRAZODONE HCL 50 MG PO TABS: 50.0000 mg | ORAL_TABLET | Freq: Every evening | ORAL | Status: DC | PRN

## 2022-11-12 MED ORDER — IPRATROPIUM-ALBUTEROL 0.5-2.5 (3) MG/3ML IN SOLN
3.0000 mL | RESPIRATORY_TRACT | Status: DC | PRN
  Administered 2022-11-14: 3 mL via RESPIRATORY_TRACT
  Filled 2022-11-12: qty 3

## 2022-11-12 MED ORDER — GUAIFENESIN 100 MG/5ML PO LIQD: 5.0000 mL | ORAL | Status: DC | PRN

## 2022-11-12 MED ORDER — HYDRALAZINE HCL 20 MG/ML IJ SOLN: 10.0000 mg | INTRAMUSCULAR | Status: DC | PRN

## 2022-11-12 NOTE — Progress Notes (Signed)
PROGRESS NOTE    Brett Marshall  ZOX:[id number] DOB: 21-Sep-1965 DOA: 11/11/2022 PCP: Ethelda Chick, MD   Brief Narrative:  57 year old with history of CAD, DM 2, CHF, HLD, pulmonary hypertension, aortic stenosis status post replacement recently underwent aortic valve replacement on 6/14 by CT surgery.  Postoperatively he has been having hard time recovery for, but strength.  Was also seen by outpatient CT surgery yesterday and given overall decline in condition patient was sent to the hospital for further management and assistance.  There has been concerns of failure to thrive at home.  Lab work showed clinical evidence of dehydration with mild acute kidney injury.  CTA chest showed postoperative possible hematoma and bilateral pleural effusion.  CT abdomen pelvis was negative for any acute pathology. CT surgery notified about the admission.    Assessment & Plan:  Principal Problem:   Failure to thrive in adult    Generalized weakness with adult failure to thrive - Postoperatively patient is having difficult time caring for himself at home.  Will check TSH, B12, folate, cortisol, vitamin D.  PT/OT.  Nutrition consult.  Acute kidney injury, prerenal Moderate dehydration - Baseline creatinine 1.0, admission creatinine 1.54.  IV fluids  History of CAD status post PCI in 2023 Bicuspid aortic valve status post surgical replacement on October 30, 2022 - CT surgery saw the patient on the day of admission and sent him to the ER for further evaluation. I Notified Dr Leafy Ro about the admission. Will see if they have anything else to add at this time otherwise continue management with aspirin, statin. Stable hematoma near sternotomy, CT surgery aware of this.  Continue to monitor  Chronic pain - Continue gabapentin  GERD - PPI  Reactive airway disease - As needed bronchodilators  Insomnia - Trazodone  Diabetes mellitus type 2, insulin-dependent -Metformin on hold.  On Levemir 20 units  daily.  Sliding scale and Accu-Cheks.  DVT prophylaxis: Lovenox Code Status: Full code Family Communication:   Given poor oral intake and AKI.  Continue hospital stay for IV fluids      Diet Orders (From admission, onward)     Start     Ordered   11/11/22 2123  Diet regular Room service appropriate? Yes; Fluid consistency: Thin  Diet effective now       Question Answer Comment  Room service appropriate? Yes   Fluid consistency: Thin      11/11/22 2122            Subjective: Sitting up in the chair.  Overall feels weak slightly poor oral intake. Agreeable to SNF pending PT evaluation   Examination:  General exam: Appears calm and comfortable, morbidly obese Respiratory system: Very mild bibasilar crackles Cardiovascular system: S1 & S2 heard, RRR. No JVD, murmurs, rubs, gallops or clicks. No pedal edema. Gastrointestinal system: Abdomen is nondistended, soft and nontender. No organomegaly or masses felt. Normal bowel sounds heard. Central nervous system: Alert and oriented. No focal neurological deficits. Extremities: Symmetric 5 x 5 power. Skin: Surgical scar chest noted Psychiatry: Judgement and insight appear normal. Mood & affect appropriate.  Objective: Vitals:   11/11/22 2030 11/11/22 2258 11/12/22 0552 11/12/22 0835  BP:  111/74 113/71   Pulse: 85 83 80   Resp:  16 16   Temp:  98.5 F (36.9 C) 98.1 F (36.7 C)   TempSrc:  Oral Oral   SpO2: 92% 96% 95% 94%  Weight:  (!) 146.1 kg    Height:  5\' 8"  (  1.727 m)     No intake or output data in the 24 hours ending 11/12/22 0839 Filed Weights   11/11/22 1511 11/11/22 2258  Weight: (!) 144 kg (!) 146.1 kg    Scheduled Meds:  aspirin EC  325 mg Oral Daily   atorvastatin  40 mg Oral q1800   DULoxetine  120 mg Oral QHS   enoxaparin (LOVENOX) injection  40 mg Subcutaneous Q24H   famotidine  40 mg Oral QHS   gabapentin  300 mg Oral QHS   insulin aspart  0-24 Units Subcutaneous TID WC   insulin detemir  20  Units Subcutaneous Daily   mometasone-formoterol  2 puff Inhalation BID   traZODone  50 mg Oral QHS   Continuous Infusions:  lactated ringers 75 mL/hr at 11/12/22 0354    Nutritional status     Body mass index is 48.97 kg/m.  Data Reviewed:   CBC: Recent Labs  Lab 11/09/22 1015 11/11/22 1525 11/12/22 0501  WBC 15.7* 15.9* 11.0*  HGB 8.2* 8.7* 9.1*  HCT 26.7* 28.7* 30.2*  MCV 89.6 89.4 89.9  PLT 517* 602* 548*   Basic Metabolic Panel: Recent Labs  Lab 11/09/22 1015 11/11/22 1525 11/12/22 0501  NA 134* 137  --   K 4.3 5.1  --   CL 97* 99  --   CO2 23 25  --   GLUCOSE 143* 144*  --   BUN 21* 15  --   CREATININE 1.41* 1.54* 1.48*  CALCIUM 8.5* 8.7*  --    GFR: Estimated Creatinine Clearance: 77.5 mL/min (A) (by C-G formula based on SCr of 1.48 mg/dL (H)). Liver Function Tests: No results for input(s): "AST", "ALT", "ALKPHOS", "BILITOT", "PROT", "ALBUMIN" in the last 168 hours. No results for input(s): "LIPASE", "AMYLASE" in the last 168 hours. No results for input(s): "AMMONIA" in the last 168 hours. Coagulation Profile: No results for input(s): "INR", "PROTIME" in the last 168 hours. Cardiac Enzymes: No results for input(s): "CKTOTAL", "CKMB", "CKMBINDEX", "TROPONINI" in the last 168 hours. BNP (last 3 results) No results for input(s): "PROBNP" in the last 8760 hours. HbA1C: No results for input(s): "HGBA1C" in the last 72 hours. CBG: Recent Labs  Lab 11/05/22 1247 11/11/22 2255  GLUCAP 173* 66*   Lipid Profile: No results for input(s): "CHOL", "HDL", "LDLCALC", "TRIG", "CHOLHDL", "LDLDIRECT" in the last 72 hours. Thyroid Function Tests: No results for input(s): "TSH", "T4TOTAL", "FREET4", "T3FREE", "THYROIDAB" in the last 72 hours. Anemia Panel: No results for input(s): "VITAMINB12", "FOLATE", "FERRITIN", "TIBC", "IRON", "RETICCTPCT" in the last 72 hours. Sepsis Labs: Recent Labs  Lab 11/09/22 1415  LATICACIDVEN 1.8    No results found for  this or any previous visit (from the past 240 hour(s)).       Radiology Studies: CT Angio Chest PE W and/or Wo Contrast  Result Date: 11/11/2022 CLINICAL DATA:  Pulmonary embolism (PE) suspected, high prob; Abdominal pain, acute, nonlocalized EXAM: CT ANGIOGRAPHY CHEST CT ABDOMEN AND PELVIS WITH CONTRAST TECHNIQUE: Multidetector CT imaging of the chest was performed using the standard protocol during bolus administration of intravenous contrast. Multiplanar CT image reconstructions and MIPs were obtained to evaluate the vascular anatomy. Multidetector CT imaging of the abdomen and pelvis was performed using the standard protocol during bolus administration of intravenous contrast. RADIATION DOSE REDUCTION: This exam was performed according to the departmental dose-optimization program which includes automated exposure control, adjustment of the mA and/or kV according to patient size and/or use of iterative reconstruction technique. CONTRAST:  75mL  OMNIPAQUE IOHEXOL 350 MG/ML SOLN COMPARISON:  CT chest 11/09/2022 FINDINGS: CTA CHEST FINDINGS Cardiovascular: Satisfactory opacification of the pulmonary arteries to the segmental level. No evidence of pulmonary embolism. Normal heart size. No significant pericardial effusion. Status post aortic valve replacement. The thoracic aorta is normal in caliber. No atherosclerotic plaque of the thoracic aorta. At least 2 vessel coronary artery calcifications. Left anterior descending coronary artery stent. Mediastinum/Nodes: Stable 6.4 x 3.7 cm prevascular fluid and gas collection likely representing a hematoma status post median sternotomy and aortic valve repair. No active extravasation of intravenous contrast or increased density within the collection. No interval peripheral enhancement to suggest abscess formation. No enlarged mediastinal, hilar, or axillary lymph nodes. Thyroid gland, trachea, and esophagus demonstrate no significant findings. Lungs/Pleura: No focal  consolidation. No pulmonary nodule. No pulmonary mass. Stable bilateral small pleural effusions. No pneumothorax. Musculoskeletal: Similar-appearing gas and fluid collection along the anterior aspect of the manubrium and manubrium clavicular joints which appears to possibly be contiguous with the pre-vascular collection. No cortical erosion or destruction. No suspicious lytic or blastic osseous lesions. No acute displaced fracture. Multilevel degenerative changes of the spine. Intact sternotomy wires. Review of the MIP images confirms the above findings. CT ABDOMEN and PELVIS FINDINGS Hepatobiliary: The hepatic parenchyma is diffusely hypodense compared to the splenic parenchyma consistent with fatty infiltration. No focal liver abnormality. No gallstones, gallbladder wall thickening, or pericholecystic fluid. No biliary dilatation. Pancreas: No focal lesion. Normal pancreatic contour. No surrounding inflammatory changes. No main pancreatic ductal dilatation. Spleen: Normal in size without focal abnormality. Adrenals/Urinary Tract: No adrenal nodule bilaterally. Bilateral kidneys enhance symmetrically. No hydronephrosis. No hydroureter. The urinary bladder is unremarkable. Stomach/Bowel: Stomach is within normal limits. No evidence of bowel wall thickening or dilatation. Appendix appears normal. Vascular/Lymphatic: No abdominal aorta or iliac aneurysm. No abdominal, pelvic, or inguinal lymphadenopathy. Reproductive: Prostate is unremarkable. Other: No intraperitoneal free fluid. No intraperitoneal free gas. No organized fluid collection. Musculoskeletal: No abdominal wall hernia or abnormality. No suspicious lytic or blastic osseous lesions. No acute displaced fracture. Multilevel degenerative changes of the spine. Review of the MIP images confirms the above findings. IMPRESSION: 1. Stable in size and appearance of a 6.4 x 3.7 cm prevascular fluid and gas collection likely representing a hematoma status post median  sternotomy and aortic valve repair. Similar-appearing gas and fluid collection along the anterior aspect of the manubrium and manubrium clavicular joints which appears to possibly be contiguous with the pre-vascular collection. No active extravasation of intravenous contrast or increased density within the collection to suggest hemorrhage. No interval peripheral enhancement to suggest abscess formation; although, infection of the sternotomy and anterior mediastinum is not excluded. No cortical erosion or destruction of the sternum to suggest osteomyelitis. Sternotomy wires are intact. 2. Bilateral small volume pleural effusions. 3. No acute intra-abdominal or intrapelvic abnormality. Electronically Signed   By: Tish Frederickson M.D.   On: 11/11/2022 20:40   CT ABDOMEN PELVIS W CONTRAST  Result Date: 11/11/2022 CLINICAL DATA:  Pulmonary embolism (PE) suspected, high prob; Abdominal pain, acute, nonlocalized EXAM: CT ANGIOGRAPHY CHEST CT ABDOMEN AND PELVIS WITH CONTRAST TECHNIQUE: Multidetector CT imaging of the chest was performed using the standard protocol during bolus administration of intravenous contrast. Multiplanar CT image reconstructions and MIPs were obtained to evaluate the vascular anatomy. Multidetector CT imaging of the abdomen and pelvis was performed using the standard protocol during bolus administration of intravenous contrast. RADIATION DOSE REDUCTION: This exam was performed according to the departmental dose-optimization program which includes  automated exposure control, adjustment of the mA and/or kV according to patient size and/or use of iterative reconstruction technique. CONTRAST:  75mL OMNIPAQUE IOHEXOL 350 MG/ML SOLN COMPARISON:  CT chest 11/09/2022 FINDINGS: CTA CHEST FINDINGS Cardiovascular: Satisfactory opacification of the pulmonary arteries to the segmental level. No evidence of pulmonary embolism. Normal heart size. No significant pericardial effusion. Status post aortic valve  replacement. The thoracic aorta is normal in caliber. No atherosclerotic plaque of the thoracic aorta. At least 2 vessel coronary artery calcifications. Left anterior descending coronary artery stent. Mediastinum/Nodes: Stable 6.4 x 3.7 cm prevascular fluid and gas collection likely representing a hematoma status post median sternotomy and aortic valve repair. No active extravasation of intravenous contrast or increased density within the collection. No interval peripheral enhancement to suggest abscess formation. No enlarged mediastinal, hilar, or axillary lymph nodes. Thyroid gland, trachea, and esophagus demonstrate no significant findings. Lungs/Pleura: No focal consolidation. No pulmonary nodule. No pulmonary mass. Stable bilateral small pleural effusions. No pneumothorax. Musculoskeletal: Similar-appearing gas and fluid collection along the anterior aspect of the manubrium and manubrium clavicular joints which appears to possibly be contiguous with the pre-vascular collection. No cortical erosion or destruction. No suspicious lytic or blastic osseous lesions. No acute displaced fracture. Multilevel degenerative changes of the spine. Intact sternotomy wires. Review of the MIP images confirms the above findings. CT ABDOMEN and PELVIS FINDINGS Hepatobiliary: The hepatic parenchyma is diffusely hypodense compared to the splenic parenchyma consistent with fatty infiltration. No focal liver abnormality. No gallstones, gallbladder wall thickening, or pericholecystic fluid. No biliary dilatation. Pancreas: No focal lesion. Normal pancreatic contour. No surrounding inflammatory changes. No main pancreatic ductal dilatation. Spleen: Normal in size without focal abnormality. Adrenals/Urinary Tract: No adrenal nodule bilaterally. Bilateral kidneys enhance symmetrically. No hydronephrosis. No hydroureter. The urinary bladder is unremarkable. Stomach/Bowel: Stomach is within normal limits. No evidence of bowel wall  thickening or dilatation. Appendix appears normal. Vascular/Lymphatic: No abdominal aorta or iliac aneurysm. No abdominal, pelvic, or inguinal lymphadenopathy. Reproductive: Prostate is unremarkable. Other: No intraperitoneal free fluid. No intraperitoneal free gas. No organized fluid collection. Musculoskeletal: No abdominal wall hernia or abnormality. No suspicious lytic or blastic osseous lesions. No acute displaced fracture. Multilevel degenerative changes of the spine. Review of the MIP images confirms the above findings. IMPRESSION: 1. Stable in size and appearance of a 6.4 x 3.7 cm prevascular fluid and gas collection likely representing a hematoma status post median sternotomy and aortic valve repair. Similar-appearing gas and fluid collection along the anterior aspect of the manubrium and manubrium clavicular joints which appears to possibly be contiguous with the pre-vascular collection. No active extravasation of intravenous contrast or increased density within the collection to suggest hemorrhage. No interval peripheral enhancement to suggest abscess formation; although, infection of the sternotomy and anterior mediastinum is not excluded. No cortical erosion or destruction of the sternum to suggest osteomyelitis. Sternotomy wires are intact. 2. Bilateral small volume pleural effusions. 3. No acute intra-abdominal or intrapelvic abnormality. Electronically Signed   By: Tish Frederickson M.D.   On: 11/11/2022 20:40   DG Chest 2 View  Result Date: 11/11/2022 CLINICAL DATA:  Status post AVR this week. Shortness of breath and fatigue. Fall 2 days ago. EXAM: CHEST - 2 VIEW COMPARISON:  None Available. FINDINGS: The heart is mildly enlarged with prior sternotomy. Prosthetic aortic valve is noted. Left basilar opacity suggesting atelectasis and/or small effusion. The visualized skeletal structures are unremarkable. IMPRESSION: Left basilar opacity suggesting atelectasis and/or small effusion. Stable mild  cardiomegaly. Electronically Signed  By: Larose Hires D.O.   On: 11/11/2022 13:56           LOS: 0 days   Time spent= 35 mins    Aarica Wax Joline Maxcy, MD [facility]  If 7PM-7AM, please contact night-coverage  11/12/2022, 8:39 AM

## 2022-11-12 NOTE — TOC Initial Note (Signed)
Transition of Care [facility]) - Initial/Assessment Note    Patient Details  Name: Brett Marshall MRN: [id number] Date of Birth: January 24, 1966  Transition of Care [facility] CM/SW Contact:    Brett Plan, RN Phone Number: 11/12/2022, 4:23 PM  Clinical Narrative:                  Spoke to patient and wife at bedside. Patient from home with wife.   Patient has a rollator and bedside commode at home.   PT recommending HHPT. Patient in agreement. Confirmed face sheet information. Cell phone is his best contact number   PCP is Brett Marshall with Brett Marshall accepted referral. Information on AVS    Expected Discharge Marshall: Home w Home Health Services Barriers to Discharge: Continued Medical Work up   Patient Goals and CMS Choice Patient states their goals for this hospitalization and ongoing recovery are:: to return to home CMS Medicare.gov Compare Post Acute Care list provided to:: Patient Choice offered to / list presented to : [name] ownership interest in [facility].provided to:: Patient    Expected Discharge Marshall and Services   Discharge Planning Services: CM Consult Post Acute Care Choice: Home Health Living arrangements for the past 2 months: Single Family Home                 DME Arranged: N/A         HH Arranged: PT, OT HH Agency: [facility] Date HH Agency Contacted: 11/12/22 Time HH Agency Contacted: 1623 Representative spoke with at [facility] Agency: Brett Marshall  Prior Living Arrangements/Services Living arrangements for the past 2 months: Single Family Home Lives with:: Spouse Patient language and need for interpreter reviewed:: Yes Do you feel safe going back to the place where you live?: Yes      Need for Family Participation in Patient Care: Yes (Comment) Care giver support system in place?: Yes (comment) Current home services: DME Criminal Activity/Legal Involvement Pertinent to Current Situation/Hospitalization: No - Comment  as needed  Activities of Daily Living      Permission Sought/Granted   Permission granted to share information with : Yes, Verbal Permission Granted  Share Information with NAME: wife           Emotional Assessment Appearance:: Appears stated age Attitude/Demeanor/Rapport: Engaged Affect (typically observed): Accepting Orientation: : Oriented to Self, Oriented to Place, Oriented to  Time, Oriented to Situation Alcohol / Substance Use: Not Applicable Psych Involvement: No (comment)  Admission diagnosis:  Failure to thrive in adult [R62.7] AKI (acute kidney injury) (HCC) [N17.9] Acute post-operative pain [G89.18] Patient Active Problem List   Diagnosis Date Noted   Failure to thrive in adult 11/11/2022   S/P AVR (aortic valve replacement) 10/30/2022   Endotracheal tube present 10/30/2022   Difficult airway for intubation 10/30/2022   Severe aortic stenosis 10/30/2022   Aortic valve stenosis 10/19/2022   Mild intermittent asthma without complication 09/14/2022   Angina pectoris (HCC) 09/08/2022   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   Chronic diastolic CHF (congestive heart failure) (HCC) 09/07/2022   Abdominal pain 09/07/2022   HLD (hyperlipidemia) 09/07/2022   CAD (coronary artery disease) 09/07/2022   Depression 09/07/2022   Dark stools 09/07/2022   AKI (acute kidney injury) (HCC) 09/17/2021   Obstructive hypertrophic cardiomyopathy (HCC) 09/17/2021   Coronary artery disease involving native coronary artery of native heart with unstable angina pectoris (HCC)    Spontaneous hematoma of forearm    Unstable angina (  HCC)    Chest pain 09/05/2021   Controlled type 2 diabetes mellitus without complication, without long-term current use of insulin (HCC) 09/05/2021   Dyspnea 10/21/2020   Hypercholesterolemia 06/05/2019   Encounter for screening colonoscopy    Polyp of sigmoid colon    Gastroesophageal reflux disease without esophagitis 04/10/2018   OSA  on CPAP 04/10/2018   Heart failure with mildly reduced ejection fraction (HFmrEF) (HCC) 12/09/2017   Pulmonary hypertension (HCC) 12/09/2017   NASH (nonalcoholic steatohepatitis) 11/07/2017   Essential hypertension 09/13/2016   DISH (diffuse idiopathic skeletal hyperostosis) 05/17/2016   DDD (degenerative disc disease), lumbar 05/17/2016   Type 2 diabetes mellitus without complication, without long-term current use of insulin (HCC) 02/08/2016   Gynecomastia 02/08/2016   Morbid obesity (HCC) 02/08/2016   PCP:  Brett Chick, MD Pharmacy:   [facility] - [place] - [address] [place] [address] Phone: [phone] Fax: [phone]     Social Determinants of Health (SDOH) Social History: SDOH Screenings   Food Insecurity: No Food Insecurity (11/03/2022)  Housing: Patient Declined (11/03/2022)  Transportation Needs: Patient Declined (11/03/2022)  Utilities: Not At Risk (11/03/2022)  Depression (PHQ2-9): Low Risk  (12/29/2021)  Recent Concern: Depression (PHQ2-9) - Medium Risk (12/08/2021)  Tobacco Use: Low Risk  (11/11/2022)   SDOH Interventions:     Readmission Risk Interventions    11/05/2022   11:52 AM  Readmission Risk Prevention Marshall  Transportation Screening Complete  PCP or Specialist Appt within 3-5 Days Complete  HRI or Home Care Consult Complete  Social Work Consult for Recovery Care Planning/Counseling Complete  Palliative Care Screening Not Applicable  Medication Review Oceanographer) Complete

## 2022-11-12 NOTE — Progress Notes (Addendum)
Initial Nutrition Assessment  DOCUMENTATION CODES:  Obesity unspecified  INTERVENTION:  Recommend starting Vitamin D 1,000 IU daily for 6 weeks due to vitamin D deficiency. MD notified, ok with RD ordering repletion.  Multivitamin w/ minerals daily Encourage good PO intake  Diet and menu reviewed Diet education  NUTRITION DIAGNOSIS:  Increased nutrient needs related to chronic illness (CHF) as evidenced by estimated needs.  GOAL:  Patient will meet greater than or equal to 90% of their needs  MONITOR:  PO intake, Labs, I & O's  REASON FOR ASSESSMENT:  Consult Assessment of nutrition requirement/status, Diet education  ASSESSMENT:  57 y.o. male presented to the ED with chest discomfort. PMH includes CAD, HTN, T2DM, HLD, CHF, and depression. Pt admitted with failure to thrive.   Pt sitting in chair, family at bedside. Pt reports that appetite is much improved, reports that his appetite was not great about a week ago. Reports that he just did not feel like eating after his procedure. Reports that typically he has 2 meals per day, a late breakfast and dinner; occasionally will have 3 per day. Does snack some at home. Does not drink and oral nutrition supplements.  Pt reports losing over 100# over the past year, reports that he has been trying to lose weight. Started watching what he was eating and decreasing his portion sizes. Happy with his weight loss and does not want to gain any weight back.   Discussed prioritizing protein with each meal to help maintain lean muscle mass and feel full longer. Encourage protein consumption with carbohydrates to help decrease spikes in CBGs.  Reviewed the plate method with the pt, pt says he is familiar with that from diabetes education class he took.  Medications reviewed and include: Pepcid, NovoLog SSI, Levemir Labs reviewed: Sodium 136, Potassium 4.1, BUN 12, Creatinine 1.49, Magnesium 1.8, Folate 11.0, Vitamin D 8.02, Vitamin B12 212, Hgb A1c  10.0 CBG: 109-176 x 24 hrs   NUTRITION - FOCUSED PHYSICAL EXAM:  Deferred to follow-up.   Diet Order:   Diet Order             Diet regular Room service appropriate? Yes; Fluid consistency: Thin  Diet effective now                  EDUCATION NEEDS: Education needs have been addressed  Skin:  Skin Assessment: Reviewed RN Assessment  Last BM:  6/28  Height:  Ht Readings from Last 1 Encounters:  11/11/22 5\' 8"  (1.727 m)   Weight:  Wt Readings from Last 1 Encounters:  11/11/22 (!) 146.1 kg   Ideal Body Weight:  70 kg  BMI:  Body mass index is 48.97 kg/m.  Estimated Nutritional Needs:  Kcal:  2100-2300 Protein:  105-125 grams Fluid:  >/= 2 L   Kirby Crigler RD, LDN Clinical Dietitian See [facility] for contact information.

## 2022-11-12 NOTE — Evaluation (Signed)
Physical Therapy Evaluation Patient Details Name: Brett Marshall MRN: [id number] DOB: 1965-12-03 Today's Date: 11/12/2022  History of Present Illness  Pt is 57 year old presented to [facility] on  10/30/22 for AVR due to severe AS. PMH - CAD, Aortic stenosis, DM, cardiomyopathy, morbid obesity, pulmonary HTN, depression  Clinical Impression  Pt presents with admitting diagnosis above. Co treat with OT. Pt today was able to ambulate short distance around room and in hallway with no AD at supervision level. Distance was limited due to pt reporting SOB and increase in chest pain. Recommend HHPT upon DC. Pt would benefit from 1 additional session to navigate stairs prior to DC.       Recommendations for follow up therapy are one component of a multi-disciplinary discharge planning process, led by the attending physician.  Recommendations may be updated based on patient status, additional functional criteria and insurance authorization.  Follow Up Recommendations       Assistance Recommended at Discharge Intermittent Supervision/Assistance  Patient can return home with the following  A little help with walking and/or transfers;A lot of help with bathing/dressing/bathroom;Assistance with cooking/housework;Assist for transportation;Help with stairs or ramp for entrance    Equipment Recommendations None recommended by PT  Recommendations for Other Services       Functional Status Assessment Patient has had a recent decline in their functional status and demonstrates the ability to make significant improvements in function in a reasonable and predictable amount of time.     Precautions / Restrictions Precautions Precautions: Fall;Sternal;Other (comment) Precaution Booklet Issued: No Precaution Comments: watch BP, pt also reports some hx vertigo Restrictions Weight Bearing Restrictions: Yes RUE Weight Bearing: Touch down weight bearing LUE Weight Bearing: Touch down weight bearing Other  Position/Activity Restrictions: sternal prec      Mobility  Bed Mobility               General bed mobility comments: Pt up in chair and states that he transferred himself there.    Transfers Overall transfer level: Independent Equipment used: None Transfers: Sit to/from Stand Sit to Stand: Independent           General transfer comment: Cues for sternal precautions    Ambulation/Gait Ambulation/Gait assistance: Supervision Gait Distance (Feet): 75 Feet Assistive device: None Gait Pattern/deviations: Step-through pattern, Decreased stride length Gait velocity: decreased     General Gait Details: Pt overall steady but reported some dizziness and lightheadedness upon returning to room. Pt reported chest tightness once seated however quickly resolved on its own.  Stairs            Wheelchair Mobility    Modified Rankin (Stroke Patients Only)       Balance Overall balance assessment: Needs assistance Sitting-balance support: Feet supported Sitting balance-Leahy Scale: Good     Standing balance support: No upper extremity supported Standing balance-Leahy Scale: Good Standing balance comment: no AD                             Pertinent Vitals/Pain Pain Assessment Pain Assessment: 0-10 Pain Score: 9  Pain Location: chest incision Pain Descriptors / Indicators: Guarding, Discomfort, Grimacing Pain Intervention(s): Monitored during session    Home Living Family/patient expects to be discharged to:: Private residence Living Arrangements: Spouse/significant other Available Help at Discharge: Family;Available 24 hours/day Type of Home: House Home Access: Stairs to enter Entrance Stairs-Rails: Right;Left;Can reach both Entrance Stairs-Number of Steps: 3   Home Layout: One level  Home Equipment: Cane - single point;Rollator (4 wheels);BSC/3in1 Additional Comments: pt interested in getting a lift chair    Prior Function Prior Level of  Function : Independent/Modified Independent;History of Falls (last six months) (Pt reports sliding out of chair at home earlier this week.)             Mobility Comments: Mod I furniture surfing or holding onto wife since surgery ADLs Comments: Pt reports ind even since surgery     Hand Dominance   Dominant Hand: Left    Extremity/Trunk Assessment   Upper Extremity Assessment Upper Extremity Assessment: LUE deficits/detail;RUE deficits/detail RUE Deficits / Details: sternal precautions, occassional numbness, hx of neuropathy RUE Sensation: history of peripheral neuropathy LUE Deficits / Details: sternal precautions, occassional numbness, hx of neuropathy LUE Sensation: history of peripheral neuropathy    Lower Extremity Assessment Lower Extremity Assessment: Defer to PT evaluation    Cervical / Trunk Assessment Cervical / Trunk Assessment: Normal  Communication   Communication: No difficulties  Cognition Arousal/Alertness: Awake/alert Behavior During Therapy: WFL for tasks assessed/performed Overall Cognitive Status: Within Functional Limits for tasks assessed                                          General Comments General comments (skin integrity, edema, etc.): HR up to 112 with ambulation.    Exercises     Assessment/Plan    PT Assessment Patient needs continued PT services  PT Problem List Decreased strength;Decreased activity tolerance;Decreased balance;Decreased mobility;Cardiopulmonary status limiting activity;Decreased knowledge of use of DME;Decreased knowledge of precautions;Obesity;Pain       PT Treatment Interventions DME instruction;Gait training;Stair training;Functional mobility training;Therapeutic activities;Therapeutic exercise;Balance training;Patient/family education    PT Goals (Current goals can be found in the Care Plan section)  Acute Rehab PT Goals Patient Stated Goal: home PT Goal Formulation: With patient Time For  Goal Achievement: 11/26/22 Potential to Achieve Goals: Good    Frequency Min 1X/week     Co-evaluation PT/OT/SLP Co-Evaluation/Treatment: Yes Reason for Co-Treatment: To address functional/ADL transfers;For patient/therapist safety PT goals addressed during session: Mobility/safety with mobility;Balance OT goals addressed during session: ADL's and self-care       AM-PAC PT "6 Clicks" Mobility  Outcome Measure Help needed turning from your back to your side while in a flat bed without using bedrails?: A Little Help needed moving from lying on your back to sitting on the side of a flat bed without using bedrails?: A Little Help needed moving to and from a bed to a chair (including a wheelchair)?: A Little Help needed standing up from a chair using your arms (e.g., wheelchair or bedside chair)?: None Help needed to walk in hospital room?: A Little Help needed climbing 3-5 steps with a railing? : A Little 6 Click Score: 19    End of Session   Activity Tolerance: Patient tolerated treatment well Patient left: in chair;with call bell/phone within reach;with family/visitor present Nurse Communication: Mobility status PT Visit Diagnosis: Other abnormalities of gait and mobility (R26.89);Muscle weakness (generalized) (M62.81);Pain;Dizziness and giddiness (R42)    Time: 2725-3664 PT Time Calculation (min) (ACUTE ONLY): 23 min   Charges:   PT Evaluation $PT Eval Moderate Complexity: 1 Mod          Walter Grima B, PT, DPT Acute Rehab Services [phone]   Gladys Damme 11/12/2022, 3:54 PM

## 2022-11-12 NOTE — Telephone Encounter (Signed)
FMLA Form completed and faxed to [employer] @ [phone]. Beginning LOA 10/30/22 through 02/01/23./ DOS 10/30/22. Form mailed to pt's home address.

## 2022-11-12 NOTE — Evaluation (Signed)
Occupational Therapy Evaluation Patient Details Name: Brett Marshall MRN: [id number] DOB: [birth year] Today's Date: 11/12/2022   History of Present Illness Pt is 57 year old presented to [facility] on  10/30/22 for AVR due to severe AS. PMH - CAD, Aortic stenosis, DM, cardiomyopathy, morbid obesity, pulmonary HTN, depression   Clinical Impression   Pt s/p above diagnosis. Co-treat with PT. Pt doing well, feeling better, more strength and energy than at admission. Pt wife present during session. Pt states there is still significant pain 9/10, does not appear to be affected or limited by pain at rest or during functional activities. Pt instructed on sternal precautions with transfers, dressing, ambulation, toileting, verbalized understanding. Pt able to ambulate around room as needed independently, able to gather/transport items as needed. Pt instructed on use of sock aide/reacher for LB dressing, able to perform mod I. Pt currently feels that he has all equipment at home needed to remain safe, wife able to assist as needed. Pt does not need continued OT or follow up.    Recommendations for follow up therapy are one component of a multi-disciplinary discharge planning process, led by the attending physician.  Recommendations may be updated based on patient status, additional functional criteria and insurance authorization.   Assistance Recommended at Discharge Intermittent Supervision/Assistance  Patient can return home with the following A little help with bathing/dressing/bathroom;Assist for transportation;Help with stairs or ramp for entrance    Functional Status Assessment  Patient has had a recent decline in their functional status and demonstrates the ability to make significant improvements in function in a reasonable and predictable amount of time.  Equipment Recommendations  None recommended by OT    Recommendations for Other Services       Precautions / Restrictions  Precautions Precautions: Fall;Sternal;Other (comment) Precaution Booklet Issued: No Precaution Comments: watch BP, pt also reports some hx vertigo Restrictions Weight Bearing Restrictions: Yes RUE Weight Bearing: Touch down weight bearing LUE Weight Bearing: Touch down weight bearing Other Position/Activity Restrictions: sternal prec      Mobility Bed Mobility               General bed mobility comments: up in chair upon arrival    Transfers Overall transfer level: Independent Equipment used: None Transfers: Sit to/from Stand Sit to Stand: Independent           General transfer comment: Cues for sternal precautions      Balance Overall balance assessment: Needs assistance Sitting-balance support: Feet supported Sitting balance-Leahy Scale: Good Sitting balance - Comments: able to perform LB ADLs with AD, maintains sternal precautions   Standing balance support: No upper extremity supported Standing balance-Leahy Scale: Good Standing balance comment: no AD                           ADL either performed or assessed with clinical judgement   ADL Overall ADL's : Needs assistance/impaired Eating/Feeding: Independent   Grooming: Modified independent   Upper Body Bathing: Minimal assistance   Lower Body Bathing: Minimal assistance;Sit to/from stand   Upper Body Dressing : Set up;Sitting   Lower Body Dressing: Modified independent;Sit to/from stand;With adaptive equipment   Toilet Transfer: Independent           Functional mobility during ADLs: Independent General ADL Comments: Pt independent with transfer/mobility, able to gather items needed for dressing, instructed on use of sock aide/reacher for socks/shoes, able to perform mod I, requires help for LB dressing at baseline  Vision Baseline Vision/History: 1 Wears glasses Ability to See in Adequate Light: 0 Adequate Patient Visual Report: No change from baseline       Perception      Praxis      Pertinent Vitals/Pain Pain Assessment Pain Assessment: 0-10 Pain Score: 9  Faces Pain Scale: Hurts little more Pain Location: chest incision Pain Descriptors / Indicators: Guarding, Discomfort, Grimacing     Hand Dominance Left   Extremity/Trunk Assessment Upper Extremity Assessment Upper Extremity Assessment: LUE deficits/detail;RUE deficits/detail RUE Deficits / Details: sternal precautions, occassional numbness, hx of neuropathy RUE Sensation: history of peripheral neuropathy LUE Deficits / Details: sternal precautions, occassional numbness, hx of neuropathy LUE Sensation: history of peripheral neuropathy   Lower Extremity Assessment Lower Extremity Assessment: Defer to PT evaluation   Cervical / Trunk Assessment Cervical / Trunk Assessment: Normal   Communication Communication Communication: No difficulties   Cognition Arousal/Alertness: Awake/alert Behavior During Therapy: WFL for tasks assessed/performed Overall Cognitive Status: Within Functional Limits for tasks assessed                                       General Comments  HR up to 112 with ambulation.    Exercises     Shoulder Instructions      Home Living Family/patient expects to be discharged to:: Private residence Living Arrangements: Spouse/significant other Available Help at Discharge: Family;Available 24 hours/day Type of Home: House Home Access: Stairs to enter [employer] of Steps: 3 Entrance Stairs-Rails: Right;Left;Can reach both Home Layout: One level     Bathroom Shower/Tub: Chief Strategy Officer: Handicapped height Bathroom Accessibility: Yes   Home Equipment: Cane - single point;Rollator (4 wheels);BSC/3in1   Additional Comments: pt interested in getting a lift chair      Prior Functioning/Environment Prior Level of Function : Independent/Modified Independent;History of Falls (last six months) (Pt reports sliding out of  chair at home earlier this week.)             Mobility Comments: Mod I furniture surfing or holding onto wife since surgery ADLs Comments: Pt reports ind even since surgery        OT Problem List: Decreased activity tolerance;Pain      OT Treatment/Interventions:      OT Goals(Current goals can be found in the care plan section) Acute Rehab OT Goals Patient Stated Goal: to return home and decrease pain OT Goal Formulation: With patient/family Time For Goal Achievement: 11/26/22 Potential to Achieve Goals: Good  OT Frequency:      Co-evaluation PT/OT/SLP Co-Evaluation/Treatment: Yes Reason for Co-Treatment: To address functional/ADL transfers;For patient/therapist safety PT goals addressed during session: Mobility/safety with mobility;Balance OT goals addressed during session: ADL's and self-care      AM-PAC OT "6 Clicks" Daily Activity     Outcome Measure Help from another person eating meals?: None Help from another person taking care of personal grooming?: None Help from another person toileting, which includes using toliet, bedpan, or urinal?: None Help from another person bathing (including washing, rinsing, drying)?: A Little Help from another person to put on and taking off regular upper body clothing?: None Help from another person to put on and taking off regular lower body clothing?: None 6 Click Score: 23   End of Session Nurse Communication: Mobility status  Activity Tolerance: Patient tolerated treatment well Patient left: in chair;with call bell/phone within reach;with family/visitor present  OT  Visit Diagnosis: History of falling (Z91.81);Muscle weakness (generalized) (M62.81);Pain Pain - part of body:  (surgical site)                Time: 8756-4332 OT Time Calculation (min): 32 min Charges:  OT General Charges $OT Visit: 1 Visit OT Evaluation $OT Eval Low Complexity: 1 Low  [address] OTR/L   Brett Marshall 11/12/2022, 4:41 PM

## 2022-11-13 ENCOUNTER — Observation Stay (HOSPITAL_COMMUNITY): Payer: BLUE CROSS/BLUE SHIELD

## 2022-11-13 DIAGNOSIS — R918 Other nonspecific abnormal finding of lung field: Secondary | ICD-10-CM | POA: Diagnosis not present

## 2022-11-13 DIAGNOSIS — R627 Adult failure to thrive: Secondary | ICD-10-CM | POA: Diagnosis not present

## 2022-11-13 DIAGNOSIS — J90 Pleural effusion, not elsewhere classified: Secondary | ICD-10-CM | POA: Diagnosis not present

## 2022-11-13 DIAGNOSIS — R531 Weakness: Secondary | ICD-10-CM | POA: Diagnosis not present

## 2022-11-13 DIAGNOSIS — R0602 Shortness of breath: Secondary | ICD-10-CM | POA: Diagnosis not present

## 2022-11-13 DIAGNOSIS — J9859 Other diseases of mediastinum, not elsewhere classified: Secondary | ICD-10-CM | POA: Diagnosis not present

## 2022-11-13 LAB — GLUCOSE, CAPILLARY
Glucose-Capillary: 109 mg/dL — ABNORMAL HIGH (ref 70–99)
Glucose-Capillary: 135 mg/dL — ABNORMAL HIGH (ref 70–99)
Glucose-Capillary: 156 mg/dL — ABNORMAL HIGH (ref 70–99)
Glucose-Capillary: 158 mg/dL — ABNORMAL HIGH (ref 70–99)

## 2022-11-13 LAB — CBC
HCT: 27.1 % — ABNORMAL LOW (ref 39.0–52.0)
Hemoglobin: 8.2 g/dL — ABNORMAL LOW (ref 13.0–17.0)
MCH: 27.4 pg (ref 26.0–34.0)
MCHC: 30.3 g/dL (ref 30.0–36.0)
MCV: 90.6 fL (ref 80.0–100.0)
Platelets: 506 10*3/uL — ABNORMAL HIGH (ref 150–400)
RBC: 2.99 MIL/uL — ABNORMAL LOW (ref 4.22–5.81)
RDW: 16.1 % — ABNORMAL HIGH (ref 11.5–15.5)
WBC: 12.1 10*3/uL — ABNORMAL HIGH (ref 4.0–10.5)
nRBC: 0.4 % — ABNORMAL HIGH (ref 0.0–0.2)

## 2022-11-13 LAB — BASIC METABOLIC PANEL
Anion gap: 13 (ref 5–15)
BUN: 12 mg/dL (ref 6–20)
CO2: 24 mmol/L (ref 22–32)
Calcium: 8.6 mg/dL — ABNORMAL LOW (ref 8.9–10.3)
Chloride: 99 mmol/L (ref 98–111)
Creatinine, Ser: 1.49 mg/dL — ABNORMAL HIGH (ref 0.61–1.24)
GFR, Estimated: 54 mL/min — ABNORMAL LOW (ref >60)
Glucose, Bld: 155 mg/dL — ABNORMAL HIGH (ref 70–99)
Potassium: 4.1 mmol/L (ref 3.5–5.1)
Sodium: 136 mmol/L (ref 135–145)

## 2022-11-13 LAB — MAGNESIUM: Magnesium: 1.8 mg/dL (ref 1.7–2.4)

## 2022-11-13 LAB — FOLATE: Folate: 11 ng/mL (ref >5.9)

## 2022-11-13 MED ORDER — ADULT MULTIVITAMIN W/MINERALS CH
1.0000 | ORAL_TABLET | Freq: Every day | ORAL | Status: DC
  Administered 2022-11-13 – 2022-11-22 (×10): 1 via ORAL
  Filled 2022-11-13 (×10): qty 1

## 2022-11-13 MED ORDER — VITAMIN D 25 MCG (1000 UNIT) PO TABS
1000.0000 [IU] | ORAL_TABLET | Freq: Every day | ORAL | Status: DC
  Administered 2022-11-13 – 2022-11-22 (×10): 1000 [IU] via ORAL
  Filled 2022-11-13 (×10): qty 1

## 2022-11-13 NOTE — Hospital Course (Addendum)
Patient is a 58 year old morbidly obese Caucasian male with a past medical history significant for but not limited to CAD, diabetes mellitus type 2, CHF, hyperlipidemia, pulm hypertension as well as aortic stenosis status post recent replacement with oral aortic valve replacement on 10/30/2022 by CT surgery.  Operatively he had been having a hard time recovering especially in strength.  He is seen by outpatient cardiothoracic surgery the day before yesterday and given his overall declining condition he was sent to the hospital for further management and assistance and diuretics were held.  There have been concerns of failure to thrive at home.  He had lab work done which showed clinical evidence of dehydration with a mild AKI and a CT of the chest was done and showed postoperative hematoma and bilateral pleural effusion.  CT abdomen pelvis was negative for any acute pathology.  CT surgery was notified and they recommended obtaining echocardiogram which was done and as below..  PT OT recommending home health.  Given that he continues to still be dyspneic pulmonary was consulted for further evaluation and medication changes have been done as below.  Assessment and Plan:  Generalized weakness with adult failure to thrive Dyspnea -Postoperatively patient is having difficult time caring for himself at home.   -TSH was 7.559 and so will check Free T4, cortisol level 16.9, vitamin B12 levels 8.02 vitamin B12 level was 212 and folate levels 11.0 -Vitamin D level was low and so this will be supplemented and nutritionist evaluated and recommending multivitamin encouraging good p.o. intake -ECHO as below  -WBC Trend:  Recent Labs  Lab 11/09/22 1015 11/11/22 1525 11/12/22 0501 11/13/22 0309 11/14/22 0030 11/15/22 0020 11/16/22 0356  WBC 15.7* 15.9* 11.0* 12.1* 10.6* 10.2 11.2*  -DG Chest done and as below -Check LE Duplex and showing no DVT -PT/OT recommending Home Health for now but he could benefit from  rehab -Nutrition consult. -See below for his dyspnea   Acute Kidney Injury, prerenal Moderate Dehydration, had improved -Baseline creatinine 1.0, admission creatinine 1.54 and now worsened.   --BUN/Cr Trend: Recent Labs  Lab 11/03/22 0111 11/09/22 1015 11/11/22 1525 11/12/22 0501 11/13/22 0309 11/14/22 0030 11/15/22 0020 11/16/22 0356  BUN 21* 21* 15  --  12 14 11 12   CREATININE 1.38* 1.41* 1.54* 1.48* 1.49* 1.67* 1.38* 1.23  -Gentle IV fluid hydration had stopped and was resumed but now stopped again and he is going to get a dose of IV Lasix 40 mg x 1 per pulmonary -UA done and showed clear appearance with negative bilirubin, greater than 500 glucose, negative hemoglobin, 5 ketones, negative leukocytes, negative nitrites, negative protein, no bacteria seen, 0-5 RBCs per high-power field and 0-5 WBCs but urine osmolality is 654 and urine sodium 26 and urine creatinine 105 -FENa Calculated and was 0.3 and Pre-Renal -Avoid Nephrotoxic Medications, Contrast Dyes, Hypotension and Dehydration to Ensure Adequate Renal Perfusion and will need to Renally Adjust Meds -Continue to Monitor and Trend Renal Function carefully and repeat CMP in the AM  -If worsening will consult Nephrology in the AM   History of CAD status post PCI in 2023 Bicuspid aortic valve status post surgical replacement on October 30, 2022 Chest Discomfort -CT surgery saw the patient on the day of admission and sent him to the ER for further evaluation. -Dr. Nelson Chimes Notified Dr Leafy Ro about the admission.  -Continue management with aspirin, statin. Stable hematoma near sternotomy, CT surgery aware of this.   -Had CT scan of the Chest and done and  showed "Stable in size and appearance of a 6.4 x 3.7 cm prevascular fluid and gas collection likely representing a hematoma status post median sternotomy and aortic valve repair. Similar-appearing gas and fluid collection along the anterior aspect of the manubrium and manubrium  clavicular joints which appears to possibly be contiguous with the pre-vascular collection. No active extravasation of intravenous contrast or increased density within the collection to suggest hemorrhage. No interval peripheral enhancement to suggest abscess formation; although, infection of the sternotomy and anterior mediastinum is not excluded. No cortical erosion or destruction of the sternum to suggest osteomyelitis. Sternotomy wires are intact. Bilateral small volume pleural effusions.. No acute intra-abdominal or intrapelvic abnormality." -Continue with aspirin 325 mg p.o. daily, atorvastatin 40 mg p.o. daily, metoprolol tartrate 5 mg IV every 4 as needed for heart rate greater than 110 -Troponin ordered and done and Trend shows: -Troponin I went from 59 -> 37 -> 24 -Check BNP in the Am -Continue to monitor and cardiothoracic surgery recommending a limited echo and this was done and showed "Limited study to assess EF and AVR. Left ventricular ejection fraction, by estimation, is 65 to 70%. The left ventricle has normal function. The left ventricle has no regional wall motion abnormalities. The aortic valve has been repaired/replaced. Aortic valve regurgitation is not visualized. There is a 23 mm Edwards Inspiris Resilia Bioprosthetic valve present in the aortic position. Echo findings are consistent with normal structure and function of the aortic valve prosthesis. Aortic valve area, by VTI measures 2.07 cm. Aortic valve mean gradient measures 14.0 mHg. Aortic valve Vmax measures 2.35 m/s. DI 0.47."  -Given 1 dose of IV Lasix and have now stopped the fluids and will renew Diuresis with 40 mg q12h    Chronic Pain -Continue Gabapentin 300 mg po qHS   GERD/GI Prophylaxis -Continue with PPI   Reactive Airway Disease Dyspnea on Exertion and Significant SOB -His Dulera was changed by pulmonary  to Budesonide 0.25 mg nebs twice daily as well as arformoterol 15 mcg nebs twice daily nebulized and  continuing DuoNebs as needed; See above -Now on Yupleri  -Will add Flutter Valve and Incentive Spirometry and Guaifenesin 1200 mg po BID -Will consider obtaining V/Q Scan; Recent CT Scan -Given his persistent dyspnea on exertion pulmonary was consulted for further evaluation recommendations -Pulmonary recommending diuresis and his fluids were stopped yesterday and was given diuresis IV 40 mg not going to IV 40 twice daily -Pulmonary recommends continuing to diurese to try and maintain euvolemia and feels that he is very physically deconditioned after being in the hospital and thinks that he would benefit from outpatient PSG at some point -Pulmonary thinks that his anemia may be playing a role in his shortness of breath but is unclear and recommending possibly transfusing followed at least Lasix to see if this shortness of breath helps. -Repeat CXR today showed "Bibasilar patchy and dense left retrocardiac opacities, likely atelectasis. Aspiration or pneumonia can be considered in the appropriate clinical setting. Similar bilateral pleural effusions. Cardiomegaly." -Patient likely has ATX  Sinus Tachycardia -Unclear reasoning but could be related to anemia and deconditioning -Persistent and will need to continue monitor telemetry -CTA as above -Consider obtaining a V/Q if persistently tachycardic   Insomnia -C/w Trazodone 50 mg po qHS  Normocytic Anemia -Hgb/Hct Trend: Recent Labs  Lab 11/09/22 1015 11/11/22 1525 11/12/22 0501 11/13/22 0309 11/14/22 0030 11/15/22 0020 11/16/22 0356  HGB 8.2* 8.7* 9.1* 8.2* 7.9* 7.7* 8.1*  HCT 26.7* 28.7* 30.2* 27.1* 25.9*  25.8* 27.1*  MCV 89.6 89.4 89.9 90.6 90.2 93.1 92.8  -Checked Anemia Panel and showed an iron level of 19, UIBC of 260, TIBC of 279, saturation ratios of 7%, ferritin level 69, folate of 8.3, vitamin B12 211 -Started po Niferex Supplement  -Continue to Monitor for S/Sx of Bleeding; No overt bleeding noted -Repeat CBC in the  AM  Thrombocytosis, improving  -Patient's Platelet Count: Recent Labs  Lab 11/09/22 1015 11/11/22 1525 11/12/22 0501 11/13/22 0309 11/14/22 0030 11/15/22 0020 11/16/22 0356  PLT 517* 602* 548* 506* 495* 489* 508*  -Continue to Monitor and Trend and repeat CMP in the AM   Diabetes Mellitus Type 2, Insulin-Dependent -CBG and Glucose Trend: Recent Labs  Lab 11/03/22 0111 11/03/22 0611 11/09/22 1015 11/11/22 1525 11/11/22 2255 11/13/22 0309 11/13/22 0732 11/14/22 0030 11/14/22 0732 11/15/22 0020 11/15/22 0900 11/16/22 0356 11/16/22 0859 11/16/22 1714  GLUCOSE 100*  --  143* 144*  --  155*  --  162*  --  189*  --  204*  --   --   GLUCAP  --    < >  --   --    < >  --    < >  --    < >  --    < >  --    < > 169*   < > = values in this interval not displayed.  -C/w Levmir 20 units sq Daily and 0-24 Units TIDwm  Hypoalbuminemia -Patient's Albumin Trend: Recent Labs  Lab 10/28/22 1113 11/14/22 0030 11/15/22 0020 11/16/22 0356  ALBUMIN 3.3* 2.6* 2.5* 2.6*  -Continue to Monitor and Trend and repeat CMP in the AM  Morbid Obesity -Complicates overall prognosis and care -Estimated body mass index is 48.97 kg/m as calculated from the following:   Height as of this encounter: 5\' 8"  (1.727 m).   Weight as of this encounter: 146.1 kg.  -Weight Loss and Dietary Counseling given

## 2022-11-13 NOTE — Progress Notes (Signed)
PROGRESS NOTE    Brett Marshall  QIO:[id number] DOB: 10-May-1966 DOA: 11/11/2022 PCP: Ethelda Chick, MD   Brief Narrative:  Patient is a 57 year old morbidly obese Caucasian male with a past medical history significant for but not limited to CAD, diabetes mellitus type 2, CHF, hyperlipidemia, pulm hypertension as well as aortic stenosis status post recent replacement with oral aortic valve replacement on 10/30/2022 by CT surgery.  Operatively he had been having a hard time recovering especially in strength.  He is seen by outpatient cardiothoracic surgery the day before yesterday and given his overall declining condition he was sent to the hospital for further management and assistance and diuretics were held.  There have been concerns of failure to thrive at home.  He had lab work done which showed clinical evidence of dehydration with a mild AKI and a CT of the chest was done and showed postoperative hematoma and bilateral pleural effusion.  CT abdomen pelvis was negative for any acute pathology.  CT surgery was notified and they recommended obtaining echocardiogram which has not been ordered.  PT OT recommending home health.   Assessment and Plan:  Generalized weakness with adult failure to thrive -Postoperatively patient is having difficult time caring for himself at home.   -TSH was 7.559 and so will check Free T4, cortisol level 16.9, vitamin B12 levels 8.02 vitamin B12 level was 212 and folate levels 11.0 -D level was low and so this will be supplemented and nutritionist evaluated and recommending multivitamin encouraging good p.o. intake -PT/OT.  Nutrition consult.   Acute Kidney Injury, prerenal Moderate dehydration -Baseline creatinine 1.0, admission creatinine 1.54.   --BUN/Cr Trend: Recent Labs  Lab 10/31/22 1627 11/01/22 0452 11/02/22 0413 11/03/22 0111 11/09/22 1015 11/11/22 1525 11/12/22 0501 11/13/22 0309  BUN 8 12 17  21* 21* 15  --  12  CREATININE 1.07 1.22 1.30*  1.38* 1.41* 1.54* 1.48* 1.49*  -Given IVF Hydration and now stopped  -Avoid Nephrotoxic Medications, Contrast Dyes, Hypotension and Dehydration to Ensure Adequate Renal Perfusion and will need to Renally Adjust Meds -Continue to Monitor and Trend Renal Function carefully and repeat CMP in the AM    History of CAD status post PCI in 2023 Bicuspid aortic valve status post surgical replacement on October 30, 2022 -CT surgery saw the patient on the day of admission and sent him to the ER for further evaluation. -Dr. Nelson Chimes Notified Dr Leafy Ro about the admission.  -Continue management with aspirin, statin. Stable hematoma near sternotomy, CT surgery aware of this.   -Continue to monitor and cardiothoracic surgery recommending a limited echo   Chronic pain -Continue Gabapentin 300 mg po qHS   GERD/GI prophylaxis -Continue with PPI   Reactive airway disease -As needed bronchodilators   Insomnia -C/w Trazodone 50 mg po qHS  Normocytic Anemia -Hgb/Hct Trend: Recent Labs  Lab 11/01/22 0452 11/02/22 0413 11/03/22 0111 11/09/22 1015 11/11/22 1525 11/12/22 0501 11/13/22 0309  HGB 7.8* 7.6* 7.2* 8.2* 8.7* 9.1* 8.2*  HCT 24.4* 24.0* 22.6* 26.7* 28.7* 30.2* 27.1*  MCV 87.1 87.9 86.9 89.6 89.4 89.9 90.6  -Check Anemia Panel in the AM -Continue to Monitor for S/Sx of Bleeding; No overt bleeding noted -Repeat CBC in the AM  Thrombocytosis -Patient's Platelet Count: Recent Labs  Lab 11/01/22 0452 11/02/22 0413 11/03/22 0111 11/09/22 1015 11/11/22 1525 11/12/22 0501 11/13/22 0309  PLT 97* 117* 171 517* 602* 548* 506*  -Continue to Monitor and Trend and repeat CMP in the AM   Diabetes Mellitus  Type 2, Insulin-Dependent -CBG and Glucose Trend: Recent Labs  Lab 10/31/22 1627 10/31/22 1938 11/01/22 0452 11/01/22 0748 11/02/22 0413 11/02/22 0814 11/03/22 0111 11/03/22 0611 11/09/22 1015 11/11/22 1525 11/11/22 2255 11/13/22 0309 11/13/22 0732 11/13/22 1558  GLUCOSE 186*   --  138*  --  105*  --  100*  --  143* 144*  --  155*  --   --   GLUCAP  --    < >  --    < >  --    < >  --    < >  --   --    < >  --    < > 156*   < > = values in this interval not displayed.  -C/w Levmir 20 units sq Daily and 0-24 Units TIDwm  Morbid Obesity -Complicates overall prognosis and care -Estimated body mass index is 48.97 kg/m as calculated from the following:   Height as of this encounter: 5\' 8"  (1.727 m).   Weight as of this encounter: 146.1 kg.  -Weight Loss and Dietary Counseling given    DVT prophylaxis: enoxaparin (LOVENOX) injection 40 mg Start: 11/12/22 1000 SCDs Start: 11/11/22 2116    Code Status: Full Code Family Communication: Discussed with the patient's wife at bedside  Disposition Plan:  Level of care: Telemetry Medical Status is: Observation The patient will require care spanning > 2 midnights and should be moved to inpatient because: Thoracic surgery recommending echocardiogram and he will need to ambulate and not be dyspneic   Consultants:  Case was discussed with cardiothoracic surgery  Procedures:  Echocardiogram  Antimicrobials:  Anti-infectives (From admission, onward)    None       Subjective: Seen and examined at bedside and he is still feeling weak and states that he gets very dyspneic on ambulation.  No nausea or vomiting.  Feels okay.  Feels very deconditioned.  No other concerns or complaints this time.  Objective: Vitals:   11/12/22 2049 11/13/22 0646 11/13/22 0734 11/13/22 1558  BP:  124/74 (!) 135/95 115/80  Pulse: (!) 57 89 98 (!) 106  Resp: 16  17 18   Temp:  98 F (36.7 C) 97.7 F (36.5 C) 98.4 F (36.9 C)  TempSrc:   Oral Oral  SpO2: 98% 100% 100% 98%  Weight:      Height:        Intake/Output Summary (Last 24 hours) at 11/13/2022 1905 Last data filed at 11/13/2022 1315 Gross per 24 hour  Intake 480 ml  Output --  Net 480 ml   Filed Weights   11/11/22 1511 11/11/22 2258  Weight: (!) 144 kg (!) 146.1 kg    Examination: Physical Exam:  Constitutional: WN/WD obese Caucasian male appears fatigued and weak Respiratory: Diminished to auscultation bilaterally, no wheezing, rales, rhonchi or crackles. Normal respiratory effort and patient is not tachypenic. No accessory muscle use.  Unlabored breathing Cardiovascular: RRR, no murmurs / rubs / gallops. S1 and S2 auscultated.  1+ extremity edema Abdomen: Soft, non-tender, distended secondary to body habitus. Bowel sounds positive.  GU: Deferred. Musculoskeletal: No clubbing / cyanosis of digits/nails. No joint deformity upper and lower extremities.   Skin: No rashes, lesions, ulcers on a limited skin evaluation. No induration; Warm and dry.  Neurologic: CN 2-12 grossly intact with no focal deficits. Romberg sign and cerebellar reflexes not assessed.  Psychiatric: Normal judgment and insight. Alert and oriented x 3. Normal mood and appropriate affect.   Data Reviewed: I have personally  reviewed following labs and imaging studies  CBC: Recent Labs  Lab 11/09/22 1015 11/11/22 1525 11/12/22 0501 11/13/22 0309  WBC 15.7* 15.9* 11.0* 12.1*  HGB 8.2* 8.7* 9.1* 8.2*  HCT 26.7* 28.7* 30.2* 27.1*  MCV 89.6 89.4 89.9 90.6  PLT 517* 602* 548* 506*   Basic Metabolic Panel: Recent Labs  Lab 11/09/22 1015 11/11/22 1525 11/12/22 0501 11/13/22 0309  NA 134* 137  --  136  K 4.3 5.1  --  4.1  CL 97* 99  --  99  CO2 23 25  --  24  GLUCOSE 143* 144*  --  155*  BUN 21* 15  --  12  CREATININE 1.41* 1.54* 1.48* 1.49*  CALCIUM 8.5* 8.7*  --  8.6*  MG  --   --   --  1.8   GFR: Estimated Creatinine Clearance: 77 mL/min (A) (by C-G formula based on SCr of 1.49 mg/dL (H)). Liver Function Tests: No results for input(s): "AST", "ALT", "ALKPHOS", "BILITOT", "PROT", "ALBUMIN" in the last 168 hours. No results for input(s): "LIPASE", "AMYLASE" in the last 168 hours. No results for input(s): "AMMONIA" in the last 168 hours. Coagulation Profile: No results  for input(s): "INR", "PROTIME" in the last 168 hours. Cardiac Enzymes: No results for input(s): "CKTOTAL", "CKMB", "CKMBINDEX", "TROPONINI" in the last 168 hours. BNP (last 3 results) No results for input(s): "PROBNP" in the last 8760 hours. HbA1C: No results for input(s): "HGBA1C" in the last 72 hours. CBG: Recent Labs  Lab 11/12/22 1732 11/12/22 2035 11/13/22 0732 11/13/22 1210 11/13/22 1558  GLUCAP 174* 176* 109* 135* 156*   Lipid Profile: No results for input(s): "CHOL", "HDL", "LDLCALC", "TRIG", "CHOLHDL", "LDLDIRECT" in the last 72 hours. Thyroid Function Tests: Recent Labs    11/12/22 0901  TSH 7.559*   Anemia Panel: Recent Labs    11/12/22 0901 11/13/22 0309  VITAMINB12 212  --   FOLATE  --  11.0   Sepsis Labs: Recent Labs  Lab 11/09/22 1415  LATICACIDVEN 1.8    No results found for this or any previous visit (from the past 240 hour(s)).   Radiology Studies: DG CHEST PORT 1 VIEW  Result Date: 11/13/2022 CLINICAL DATA:  Shortness of breath EXAM: PORTABLE CHEST 1 VIEW COMPARISON:  X-ray and CT angiogram chest 11/11/2022 FINDINGS: Status post median sternotomy with enlarged cardiopericardial silhouette. There is masslike enlargement of the mediastinum. Please correlate with prior CT scan describing the 6.4 cm collection. Question tiny left effusion with some adjacent opacity. No pneumothorax or edema. IMPRESSION: Persistent widened mediastinum with masslike changes. Please correlate with the prior CT. Small left effusion with the adjacent lung opacity, unchanged from prior x-ray. Recommend continued follow-up Electronically Signed   By: Karen Kays M.D.   On: 11/13/2022 16:32   CT Angio Chest PE W and/or Wo Contrast  Result Date: 11/11/2022 CLINICAL DATA:  Pulmonary embolism (PE) suspected, high prob; Abdominal pain, acute, nonlocalized EXAM: CT ANGIOGRAPHY CHEST CT ABDOMEN AND PELVIS WITH CONTRAST TECHNIQUE: Multidetector CT imaging of the chest was performed  using the standard protocol during bolus administration of intravenous contrast. Multiplanar CT image reconstructions and MIPs were obtained to evaluate the vascular anatomy. Multidetector CT imaging of the abdomen and pelvis was performed using the standard protocol during bolus administration of intravenous contrast. RADIATION DOSE REDUCTION: This exam was performed according to the departmental dose-optimization program which includes automated exposure control, adjustment of the mA and/or kV according to patient size and/or use of iterative reconstruction technique. CONTRAST:  75mL OMNIPAQUE IOHEXOL 350 MG/ML SOLN COMPARISON:  CT chest 11/09/2022 FINDINGS: CTA CHEST FINDINGS Cardiovascular: Satisfactory opacification of the pulmonary arteries to the segmental level. No evidence of pulmonary embolism. Normal heart size. No significant pericardial effusion. Status post aortic valve replacement. The thoracic aorta is normal in caliber. No atherosclerotic plaque of the thoracic aorta. At least 2 vessel coronary artery calcifications. Left anterior descending coronary artery stent. Mediastinum/Nodes: Stable 6.4 x 3.7 cm prevascular fluid and gas collection likely representing a hematoma status post median sternotomy and aortic valve repair. No active extravasation of intravenous contrast or increased density within the collection. No interval peripheral enhancement to suggest abscess formation. No enlarged mediastinal, hilar, or axillary lymph nodes. Thyroid gland, trachea, and esophagus demonstrate no significant findings. Lungs/Pleura: No focal consolidation. No pulmonary nodule. No pulmonary mass. Stable bilateral small pleural effusions. No pneumothorax. Musculoskeletal: Similar-appearing gas and fluid collection along the anterior aspect of the manubrium and manubrium clavicular joints which appears to possibly be contiguous with the pre-vascular collection. No cortical erosion or destruction. No suspicious  lytic or blastic osseous lesions. No acute displaced fracture. Multilevel degenerative changes of the spine. Intact sternotomy wires. Review of the MIP images confirms the above findings. CT ABDOMEN and PELVIS FINDINGS Hepatobiliary: The hepatic parenchyma is diffusely hypodense compared to the splenic parenchyma consistent with fatty infiltration. No focal liver abnormality. No gallstones, gallbladder wall thickening, or pericholecystic fluid. No biliary dilatation. Pancreas: No focal lesion. Normal pancreatic contour. No surrounding inflammatory changes. No main pancreatic ductal dilatation. Spleen: Normal in size without focal abnormality. Adrenals/Urinary Tract: No adrenal nodule bilaterally. Bilateral kidneys enhance symmetrically. No hydronephrosis. No hydroureter. The urinary bladder is unremarkable. Stomach/Bowel: Stomach is within normal limits. No evidence of bowel wall thickening or dilatation. Appendix appears normal. Vascular/Lymphatic: No abdominal aorta or iliac aneurysm. No abdominal, pelvic, or inguinal lymphadenopathy. Reproductive: Prostate is unremarkable. Other: No intraperitoneal free fluid. No intraperitoneal free gas. No organized fluid collection. Musculoskeletal: No abdominal wall hernia or abnormality. No suspicious lytic or blastic osseous lesions. No acute displaced fracture. Multilevel degenerative changes of the spine. Review of the MIP images confirms the above findings. IMPRESSION: 1. Stable in size and appearance of a 6.4 x 3.7 cm prevascular fluid and gas collection likely representing a hematoma status post median sternotomy and aortic valve repair. Similar-appearing gas and fluid collection along the anterior aspect of the manubrium and manubrium clavicular joints which appears to possibly be contiguous with the pre-vascular collection. No active extravasation of intravenous contrast or increased density within the collection to suggest hemorrhage. No interval peripheral  enhancement to suggest abscess formation; although, infection of the sternotomy and anterior mediastinum is not excluded. No cortical erosion or destruction of the sternum to suggest osteomyelitis. Sternotomy wires are intact. 2. Bilateral small volume pleural effusions. 3. No acute intra-abdominal or intrapelvic abnormality. Electronically Signed   By: Tish Frederickson M.D.   On: 11/11/2022 20:40   CT ABDOMEN PELVIS W CONTRAST  Result Date: 11/11/2022 CLINICAL DATA:  Pulmonary embolism (PE) suspected, high prob; Abdominal pain, acute, nonlocalized EXAM: CT ANGIOGRAPHY CHEST CT ABDOMEN AND PELVIS WITH CONTRAST TECHNIQUE: Multidetector CT imaging of the chest was performed using the standard protocol during bolus administration of intravenous contrast. Multiplanar CT image reconstructions and MIPs were obtained to evaluate the vascular anatomy. Multidetector CT imaging of the abdomen and pelvis was performed using the standard protocol during bolus administration of intravenous contrast. RADIATION DOSE REDUCTION: This exam was performed according to the departmental dose-optimization program which  includes automated exposure control, adjustment of the mA and/or kV according to patient size and/or use of iterative reconstruction technique. CONTRAST:  75mL OMNIPAQUE IOHEXOL 350 MG/ML SOLN COMPARISON:  CT chest 11/09/2022 FINDINGS: CTA CHEST FINDINGS Cardiovascular: Satisfactory opacification of the pulmonary arteries to the segmental level. No evidence of pulmonary embolism. Normal heart size. No significant pericardial effusion. Status post aortic valve replacement. The thoracic aorta is normal in caliber. No atherosclerotic plaque of the thoracic aorta. At least 2 vessel coronary artery calcifications. Left anterior descending coronary artery stent. Mediastinum/Nodes: Stable 6.4 x 3.7 cm prevascular fluid and gas collection likely representing a hematoma status post median sternotomy and aortic valve repair. No  active extravasation of intravenous contrast or increased density within the collection. No interval peripheral enhancement to suggest abscess formation. No enlarged mediastinal, hilar, or axillary lymph nodes. Thyroid gland, trachea, and esophagus demonstrate no significant findings. Lungs/Pleura: No focal consolidation. No pulmonary nodule. No pulmonary mass. Stable bilateral small pleural effusions. No pneumothorax. Musculoskeletal: Similar-appearing gas and fluid collection along the anterior aspect of the manubrium and manubrium clavicular joints which appears to possibly be contiguous with the pre-vascular collection. No cortical erosion or destruction. No suspicious lytic or blastic osseous lesions. No acute displaced fracture. Multilevel degenerative changes of the spine. Intact sternotomy wires. Review of the MIP images confirms the above findings. CT ABDOMEN and PELVIS FINDINGS Hepatobiliary: The hepatic parenchyma is diffusely hypodense compared to the splenic parenchyma consistent with fatty infiltration. No focal liver abnormality. No gallstones, gallbladder wall thickening, or pericholecystic fluid. No biliary dilatation. Pancreas: No focal lesion. Normal pancreatic contour. No surrounding inflammatory changes. No main pancreatic ductal dilatation. Spleen: Normal in size without focal abnormality. Adrenals/Urinary Tract: No adrenal nodule bilaterally. Bilateral kidneys enhance symmetrically. No hydronephrosis. No hydroureter. The urinary bladder is unremarkable. Stomach/Bowel: Stomach is within normal limits. No evidence of bowel wall thickening or dilatation. Appendix appears normal. Vascular/Lymphatic: No abdominal aorta or iliac aneurysm. No abdominal, pelvic, or inguinal lymphadenopathy. Reproductive: Prostate is unremarkable. Other: No intraperitoneal free fluid. No intraperitoneal free gas. No organized fluid collection. Musculoskeletal: No abdominal wall hernia or abnormality. No suspicious  lytic or blastic osseous lesions. No acute displaced fracture. Multilevel degenerative changes of the spine. Review of the MIP images confirms the above findings. IMPRESSION: 1. Stable in size and appearance of a 6.4 x 3.7 cm prevascular fluid and gas collection likely representing a hematoma status post median sternotomy and aortic valve repair. Similar-appearing gas and fluid collection along the anterior aspect of the manubrium and manubrium clavicular joints which appears to possibly be contiguous with the pre-vascular collection. No active extravasation of intravenous contrast or increased density within the collection to suggest hemorrhage. No interval peripheral enhancement to suggest abscess formation; although, infection of the sternotomy and anterior mediastinum is not excluded. No cortical erosion or destruction of the sternum to suggest osteomyelitis. Sternotomy wires are intact. 2. Bilateral small volume pleural effusions. 3. No acute intra-abdominal or intrapelvic abnormality. Electronically Signed   By: Tish Frederickson M.D.   On: 11/11/2022 20:40    Scheduled Meds:  aspirin EC  325 mg Oral Daily   atorvastatin  40 mg Oral q1800   cholecalciferol  1,000 Units Oral Daily   DULoxetine  120 mg Oral QHS   enoxaparin (LOVENOX) injection  40 mg Subcutaneous Q24H   famotidine  40 mg Oral QHS   gabapentin  300 mg Oral QHS   insulin aspart  0-24 Units Subcutaneous TID WC   insulin detemir  20 Units Subcutaneous Daily   mometasone-formoterol  2 puff Inhalation BID   multivitamin with minerals  1 tablet Oral Daily   traZODone  50 mg Oral QHS   Continuous Infusions:   LOS: 0 days   Marguerita Merles, DO [facility] Available via Epic secure chat 7am-7pm After these hours, please refer to coverage provider listed on [email or web address] 11/13/2022, 7:05 PM

## 2022-11-13 NOTE — Plan of Care (Signed)
  Problem: Education: Goal: Individualized Educational Video(s) Outcome: Progressing   Problem: Cardiac: Goal: Will achieve and/or maintain hemodynamic stability Outcome: Progressing   Problem: Respiratory: Goal: Respiratory status will improve Outcome: Progressing   Problem: Skin Integrity: Goal: Wound healing without signs and symptoms of infection Outcome: Progressing   Problem: Activity: Goal: Ability to return to baseline activity level will improve Outcome: Progressing   Problem: Clinical Measurements: Goal: Will remain free from infection Outcome: Progressing   Problem: Safety: Goal: Ability to remain free from injury will improve Outcome: Progressing

## 2022-11-13 NOTE — Progress Notes (Signed)
Physical Therapy Treatment Patient Details Name: Brett Marshall MRN: [id number] DOB: 05/08/1966 Today's Date: 11/13/2022   History of Present Illness Pt is 57 year old presents to [facility] on 6/26 with failure to thrive in post operative setting at home after office visit on 6/26.  He reported a fall on 6/24 and was seen in ED at [facility] but sent home. Patient denies hitting head but reports pain near sternal incision (AVR 10/30/22). Patient's spouse stated that he had fallen on his buttocks but now is complaining of difficulty breathing and only taking shallow breaths. PMH - Recent admission on 10/30/22 for AVR, CAD, Aortic stenosis, DM, cardiomyopathy, morbid obesity, pulmonary HTN, depression    PT Comments    Pt seated in recliner. Reports he still feels winded after walk to and from bathroom.  He was agreeable to PT session but required frequent rest breaks as he presents deconditioned.  Educated to ambulate 3x/daily and perform postural exercises. Pt issued incentive spirometer.  He was able to achieve - on incentive spirometer.  He continues to benefit from HHPT at d/c to establish home safety and workout routine to recover from his hospitalizations.  Med bridge code:   Access Code: [email or web address]   Recommendations for follow up therapy are one component of a multi-disciplinary discharge planning process, led by the attending physician.  Recommendations may be updated based on patient status, additional functional criteria and insurance authorization.  Follow Up Recommendations       Assistance Recommended at Discharge Intermittent Supervision/Assistance  Patient can return home with the following A little help with walking and/or transfers;A lot of help with bathing/dressing/bathroom;Assistance with cooking/housework;Assist for transportation;Help with stairs or ramp for entrance   Equipment Recommendations  None recommended by PT    Recommendations for Other Services        Precautions / Restrictions Precautions Precautions: Fall;Sternal;Other (comment) Precaution Booklet Issued: No Precaution Comments: watch BP, pt also reports some hx vertigo Restrictions Weight Bearing Restrictions: Yes RUE Weight Bearing: Touch down weight bearing LUE Weight Bearing: Touch down weight bearing Other Position/Activity Restrictions: sternal prec     Mobility  Bed Mobility               General bed mobility comments: up in chair upon arrival    Transfers Overall transfer level: Needs assistance Equipment used: None Transfers: Sit to/from Stand Sit to Stand: Supervision           General transfer comment: Cues for sternal precautions    Ambulation/Gait Ambulation/Gait assistance: Supervision Gait Distance (Feet): 80 Feet Assistive device: None Gait Pattern/deviations: Step-through pattern, Decreased stride length Gait velocity: decreased     General Gait Details: Cues for pacing and standing rest breaks.  Presents with increased WOB this session but denies dizziness.   Stairs Stairs: Yes Stairs assistance: Supervision Stair Management: One rail Right Number of Stairs: 3 General stair comments: Cues for sequencing and sternal precautions when using rail to [place] x3 stairs.   Wheelchair Mobility    Modified Rankin (Stroke Patients Only)       Balance Overall balance assessment: Needs assistance Sitting-balance support: Feet supported Sitting balance-Leahy Scale: Good       Standing balance-Leahy Scale: Fair                              Cognition Arousal/Alertness: Awake/alert Behavior During Therapy: WFL for tasks assessed/performed Overall Cognitive Status: Within Functional Limits for tasks assessed  General Comments: spouse present and also receptive to instruction.        Exercises Other Exercises Other Exercises: Pt performed 1x10 reps of seated cervical  flex/ext, cervical rotation to L/R, chin tucks, scap retraction, dorsal extension, backwards shoulder rolls, and B shoulder elevation to improve posture and reduce pain around sternal incision.  HEP issued for home use.    General Comments        Pertinent Vitals/Pain Pain Assessment Pain Assessment: 0-10 Pain Score: 5  Pain Location: chest incision Pain Descriptors / Indicators: Guarding, Discomfort, Grimacing Pain Intervention(s): Monitored during session, Repositioned    Home Living                          Prior Function            PT Goals (current goals can now be found in the care plan section) Acute Rehab PT Goals Patient Stated Goal: home Potential to Achieve Goals: Good Progress towards PT goals: Progressing toward goals    Frequency    Min 1X/week      PT Plan Current plan remains appropriate    Co-evaluation              AM-PAC PT "6 Clicks" Mobility   Outcome Measure  Help needed turning from your back to your side while in a flat bed without using bedrails?: A Little Help needed moving from lying on your back to sitting on the side of a flat bed without using bedrails?: A Little Help needed moving to and from a bed to a chair (including a wheelchair)?: A Little Help needed standing up from a chair using your arms (e.g., wheelchair or bedside chair)?: None Help needed to walk in hospital room?: A Little Help needed climbing 3-5 steps with a railing? : A Little 6 Click Score: 19    End of Session Equipment Utilized During Treatment: Gait belt Activity Tolerance: Patient tolerated treatment well Patient left: in chair;with call bell/phone within reach;with family/visitor present Nurse Communication: Mobility status PT Visit Diagnosis: Other abnormalities of gait and mobility (R26.89);Muscle weakness (generalized) (M62.81);Pain;Dizziness and giddiness (R42) Pain - part of body:  (chest)     Time: 6440-3474 PT Time Calculation  (min) (ACUTE ONLY): 24 min  Charges:  $Gait Training: 8-22 mins $Therapeutic Exercise: 8-22 mins                     Bonney Leitz , PTA Acute Rehabilitation Services Office [phone]    Florestine Avers 11/13/2022, 3:57 PM

## 2022-11-14 ENCOUNTER — Observation Stay (HOSPITAL_COMMUNITY): Payer: BLUE CROSS/BLUE SHIELD

## 2022-11-14 ENCOUNTER — Other Ambulatory Visit: Payer: Self-pay

## 2022-11-14 DIAGNOSIS — E860 Dehydration: Secondary | ICD-10-CM | POA: Diagnosis present

## 2022-11-14 DIAGNOSIS — G4733 Obstructive sleep apnea (adult) (pediatric): Secondary | ICD-10-CM | POA: Diagnosis present

## 2022-11-14 DIAGNOSIS — M7989 Other specified soft tissue disorders: Secondary | ICD-10-CM | POA: Diagnosis not present

## 2022-11-14 DIAGNOSIS — R531 Weakness: Secondary | ICD-10-CM | POA: Diagnosis not present

## 2022-11-14 DIAGNOSIS — I10 Essential (primary) hypertension: Secondary | ICD-10-CM

## 2022-11-14 DIAGNOSIS — R0600 Dyspnea, unspecified: Secondary | ICD-10-CM

## 2022-11-14 DIAGNOSIS — I517 Cardiomegaly: Secondary | ICD-10-CM | POA: Diagnosis not present

## 2022-11-14 DIAGNOSIS — G8918 Other acute postprocedural pain: Secondary | ICD-10-CM | POA: Diagnosis not present

## 2022-11-14 DIAGNOSIS — J9811 Atelectasis: Secondary | ICD-10-CM | POA: Diagnosis present

## 2022-11-14 DIAGNOSIS — M96841 Postprocedural hematoma of a musculoskeletal structure following other procedure: Secondary | ICD-10-CM | POA: Diagnosis not present

## 2022-11-14 DIAGNOSIS — Z952 Presence of prosthetic heart valve: Secondary | ICD-10-CM | POA: Diagnosis not present

## 2022-11-14 DIAGNOSIS — K219 Gastro-esophageal reflux disease without esophagitis: Secondary | ICD-10-CM | POA: Diagnosis present

## 2022-11-14 DIAGNOSIS — I110 Hypertensive heart disease with heart failure: Secondary | ICD-10-CM | POA: Diagnosis not present

## 2022-11-14 DIAGNOSIS — G4700 Insomnia, unspecified: Secondary | ICD-10-CM | POA: Diagnosis present

## 2022-11-14 DIAGNOSIS — J45909 Unspecified asthma, uncomplicated: Secondary | ICD-10-CM | POA: Diagnosis not present

## 2022-11-14 DIAGNOSIS — R0602 Shortness of breath: Secondary | ICD-10-CM | POA: Diagnosis not present

## 2022-11-14 DIAGNOSIS — J90 Pleural effusion, not elsewhere classified: Secondary | ICD-10-CM | POA: Diagnosis not present

## 2022-11-14 DIAGNOSIS — N179 Acute kidney failure, unspecified: Secondary | ICD-10-CM | POA: Diagnosis not present

## 2022-11-14 DIAGNOSIS — I428 Other cardiomyopathies: Secondary | ICD-10-CM | POA: Diagnosis not present

## 2022-11-14 DIAGNOSIS — J984 Other disorders of lung: Secondary | ICD-10-CM | POA: Diagnosis not present

## 2022-11-14 DIAGNOSIS — I5032 Chronic diastolic (congestive) heart failure: Secondary | ICD-10-CM | POA: Diagnosis present

## 2022-11-14 DIAGNOSIS — R0609 Other forms of dyspnea: Secondary | ICD-10-CM | POA: Diagnosis present

## 2022-11-14 DIAGNOSIS — E1142 Type 2 diabetes mellitus with diabetic polyneuropathy: Secondary | ICD-10-CM | POA: Diagnosis not present

## 2022-11-14 DIAGNOSIS — D509 Iron deficiency anemia, unspecified: Secondary | ICD-10-CM | POA: Diagnosis not present

## 2022-11-14 DIAGNOSIS — E785 Hyperlipidemia, unspecified: Secondary | ICD-10-CM | POA: Diagnosis present

## 2022-11-14 DIAGNOSIS — R918 Other nonspecific abnormal finding of lung field: Secondary | ICD-10-CM | POA: Diagnosis not present

## 2022-11-14 DIAGNOSIS — E538 Deficiency of other specified B group vitamins: Secondary | ICD-10-CM | POA: Diagnosis present

## 2022-11-14 DIAGNOSIS — I272 Pulmonary hypertension, unspecified: Secondary | ICD-10-CM | POA: Diagnosis not present

## 2022-11-14 DIAGNOSIS — G8929 Other chronic pain: Secondary | ICD-10-CM | POA: Diagnosis present

## 2022-11-14 DIAGNOSIS — Z794 Long term (current) use of insulin: Secondary | ICD-10-CM | POA: Diagnosis not present

## 2022-11-14 DIAGNOSIS — R627 Adult failure to thrive: Secondary | ICD-10-CM

## 2022-11-14 DIAGNOSIS — E8809 Other disorders of plasma-protein metabolism, not elsewhere classified: Secondary | ICD-10-CM | POA: Diagnosis not present

## 2022-11-14 DIAGNOSIS — Z6841 Body Mass Index (BMI) 40.0 and over, adult: Secondary | ICD-10-CM | POA: Diagnosis not present

## 2022-11-14 DIAGNOSIS — D649 Anemia, unspecified: Secondary | ICD-10-CM | POA: Diagnosis not present

## 2022-11-14 LAB — CBC WITH DIFFERENTIAL/PLATELET
Abs Immature Granulocytes: 0.06 10*3/uL (ref 0.00–0.07)
Basophils Absolute: 0.1 10*3/uL (ref 0.0–0.1)
Basophils Relative: 1 %
Eosinophils Absolute: 0.1 10*3/uL (ref 0.0–0.5)
Eosinophils Relative: 1 %
HCT: 25.9 % — ABNORMAL LOW (ref 39.0–52.0)
Hemoglobin: 7.9 g/dL — ABNORMAL LOW (ref 13.0–17.0)
Immature Granulocytes: 1 %
Lymphocytes Relative: 18 %
Lymphs Abs: 1.9 10*3/uL (ref 0.7–4.0)
MCH: 27.5 pg (ref 26.0–34.0)
MCHC: 30.5 g/dL (ref 30.0–36.0)
MCV: 90.2 fL (ref 80.0–100.0)
Monocytes Absolute: 0.7 10*3/uL (ref 0.1–1.0)
Monocytes Relative: 7 %
Neutro Abs: 7.7 10*3/uL (ref 1.7–7.7)
Neutrophils Relative %: 72 %
Platelets: 495 10*3/uL — ABNORMAL HIGH (ref 150–400)
RBC: 2.87 MIL/uL — ABNORMAL LOW (ref 4.22–5.81)
RDW: 16.1 % — ABNORMAL HIGH (ref 11.5–15.5)
WBC: 10.6 10*3/uL — ABNORMAL HIGH (ref 4.0–10.5)
nRBC: 0.4 % — ABNORMAL HIGH (ref 0.0–0.2)

## 2022-11-14 LAB — ECHOCARDIOGRAM LIMITED
AR max vel: 1.67 cm2
AV Area VTI: 2.07 cm2
AV Area mean vel: 1.86 cm2
AV Mean grad: 14 mmHg
AV Peak grad: 22 mmHg
Ao pk vel: 2.35 m/s
Height: 68 in
Weight: 5153.47 oz

## 2022-11-14 LAB — TROPONIN I (HIGH SENSITIVITY)
Troponin I (High Sensitivity): 27 ng/L — ABNORMAL HIGH (ref <18)
Troponin I (High Sensitivity): 25 ng/L — ABNORMAL HIGH (ref <18)
Troponin I (High Sensitivity): 29 ng/L — ABNORMAL HIGH (ref <18)

## 2022-11-14 LAB — URINALYSIS, ROUTINE W REFLEX MICROSCOPIC
Bacteria, UA: NONE SEEN
Bilirubin Urine: NEGATIVE
Glucose, UA: >=500 mg/dL — AB
Hgb urine dipstick: NEGATIVE
Ketones, ur: 5 mg/dL — AB
Leukocytes,Ua: NEGATIVE
Nitrite: NEGATIVE
Protein, ur: NEGATIVE mg/dL
Specific Gravity, Urine: 1.028 (ref 1.005–1.030)
pH: 6 (ref 5.0–8.0)

## 2022-11-14 LAB — COMPREHENSIVE METABOLIC PANEL
ALT: 14 U/L (ref 0–44)
AST: 18 U/L (ref 15–41)
Albumin: 2.6 g/dL — ABNORMAL LOW (ref 3.5–5.0)
Alkaline Phosphatase: 104 U/L (ref 38–126)
Anion gap: 9 (ref 5–15)
BUN: 14 mg/dL (ref 6–20)
CO2: 29 mmol/L (ref 22–32)
Calcium: 8.7 mg/dL — ABNORMAL LOW (ref 8.9–10.3)
Chloride: 99 mmol/L (ref 98–111)
Creatinine, Ser: 1.67 mg/dL — ABNORMAL HIGH (ref 0.61–1.24)
GFR, Estimated: 47 mL/min — ABNORMAL LOW (ref >60)
Glucose, Bld: 162 mg/dL — ABNORMAL HIGH (ref 70–99)
Potassium: 4.1 mmol/L (ref 3.5–5.1)
Sodium: 137 mmol/L (ref 135–145)
Total Bilirubin: 0.7 mg/dL (ref 0.3–1.2)
Total Protein: 6.7 g/dL (ref 6.5–8.1)

## 2022-11-14 LAB — GLUCOSE, CAPILLARY
Glucose-Capillary: 155 mg/dL — ABNORMAL HIGH (ref 70–99)
Glucose-Capillary: 184 mg/dL — ABNORMAL HIGH (ref 70–99)
Glucose-Capillary: 209 mg/dL — ABNORMAL HIGH (ref 70–99)
Glucose-Capillary: 151 mg/dL — ABNORMAL HIGH (ref 70–99)

## 2022-11-14 LAB — MAGNESIUM: Magnesium: 1.9 mg/dL (ref 1.7–2.4)

## 2022-11-14 LAB — OSMOLALITY, URINE: Osmolality, Ur: 654 mOsm/kg (ref 300–900)

## 2022-11-14 LAB — PHOSPHORUS: Phosphorus: 3.8 mg/dL (ref 2.5–4.6)

## 2022-11-14 LAB — CREATININE, URINE, RANDOM: Creatinine, Urine: 105 mg/dL

## 2022-11-14 LAB — SODIUM, URINE, RANDOM: Sodium, Ur: 26 mmol/L

## 2022-11-14 MED ORDER — SODIUM CHLORIDE 0.9 % IV SOLN: INTRAVENOUS | Status: DC

## 2022-11-14 MED ORDER — PERFLUTREN LIPID MICROSPHERE
1.0000 mL | INTRAVENOUS | Status: AC | PRN
  Administered 2022-11-14: 4 mL via INTRAVENOUS

## 2022-11-14 NOTE — Progress Notes (Signed)
   11/14/22 2140  BiPAP/CPAP/SIPAP  $ Non-Invasive Home Ventilator  Initial  $ Face Mask Medium Yes  BiPAP/CPAP/SIPAP Pt Type Adult  BiPAP/CPAP/SIPAP Resmed  Mask Type Full face mask  Mask Size Medium  Flow Rate 2 lpm  Patient Home Equipment No  Auto Titrate Yes (5-20 cmH2O)

## 2022-11-14 NOTE — Progress Notes (Signed)
      [address]       [place] [address]             [phone]      I was asked to see this patient by Dr. Kateri Mc due to some chest pain earlier today. Patient states it was a sharp pain to the left of his sternal incision and along his sternal incision and was brief in nature. It has now resolved and he is sitting up on the edge of his bed eating lunch. Likely continued sternal discomfort and discomfort due to hematoma. His Troponin is trending down and hematoma on CXR looks stable. Limited echo shows his aortic valve is in normal position and functioning normally. I will sign off, hospitalist team to continue primary care.   Physical exam: CV: regular rate and rhythm, no murmur Pulm: Clear to ausculation  Jenny Reichmann, PA-C

## 2022-11-14 NOTE — Progress Notes (Signed)
PROGRESS NOTE    Brett Marshall  ION:[id number] DOB: Sep 21, 1965 DOA: 11/11/2022 PCP: Ethelda Chick, MD   Brief Narrative:  Patient is a 57 year old morbidly obese Caucasian male with a past medical history significant for but not limited to CAD, diabetes mellitus type 2, CHF, hyperlipidemia, pulm hypertension as well as aortic stenosis status post recent replacement with oral aortic valve replacement on 10/30/2022 by CT surgery.  Operatively he had been having a hard time recovering especially in strength.  He is seen by outpatient cardiothoracic surgery the day before yesterday and given his overall declining condition he was sent to the hospital for further management and assistance and diuretics were held.  There have been concerns of failure to thrive at home.  He had lab work done which showed clinical evidence of dehydration with a mild AKI and a CT of the chest was done and showed postoperative hematoma and bilateral pleural effusion.  CT abdomen pelvis was negative for any acute pathology.  CT surgery was notified and they recommended obtaining echocardiogram which has not been ordered and done and .  PT OT recommending home health.   Assessment and Plan:  Generalized weakness with adult failure to thrive Dyspnea -Postoperatively patient is having difficult time caring for himself at home.   -TSH was 7.559 and so will check Free T4, cortisol level 16.9, vitamin B12 levels 8.02 vitamin B12 level was 212 and folate levels 11.0 -Vitamin D level was low and so this will be supplemented and nutritionist evaluated and recommending multivitamin encouraging good p.o. intake -ECHO as below  -WBC Trend:  Recent Labs  Lab 11/02/22 0413 11/03/22 0111 11/09/22 1015 11/11/22 1525 11/12/22 0501 11/13/22 0309 11/14/22 0030  WBC 10.9* 12.6* 15.7* 15.9* 11.0* 12.1* 10.6*  -DG Chest done and showed "Cardiomegaly. There are no signs of pulmonary edema or new focal infiltrates. Increased density in  left lower lung field may suggest pleural effusion and possibly underlying atelectasis. Haziness in the right lower lung fields suggest layering small effusion." -PT/OT recommending Home Health -Nutrition consult.   Acute Kidney Injury, prerenal Moderate Dehydration -Baseline creatinine 1.0, admission creatinine 1.54 and now worsened.   --BUN/Cr Trend: Recent Labs  Lab 11/01/22 0452 11/02/22 0413 11/03/22 0111 11/09/22 1015 11/11/22 1525 11/12/22 0501 11/13/22 0309 11/14/22 0030  BUN 12 17 21* 21* 15  --  12 14  CREATININE 1.22 1.30* 1.38* 1.41* 1.54* 1.48* 1.49* 1.67*  -Given IVF Hydration and had stopped and will resume now -UA done and showed clear appearance with negative bilirubin, greater than 500 glucose, negative hemoglobin, 5 ketones, negative leukocytes, negative nitrites, negative protein, no bacteria seen, 0-5 RBCs per high-power field and 0-5 WBCs but urine osmolality is 654 and urine sodium 26 and urine creatinine 105 -FENa Calculated and was 0.3 and Pre-Renal -Avoid Nephrotoxic Medications, Contrast Dyes, Hypotension and Dehydration to Ensure Adequate Renal Perfusion and will need to Renally Adjust Meds -Continue to Monitor and Trend Renal Function carefully and repeat CMP in the AM  -If worsening will consult Nephrology in the AM   History of CAD status post PCI in 2023 Bicuspid aortic valve status post surgical replacement on October 30, 2022 Chest Discomfort -CT surgery saw the patient on the day of admission and sent him to the ER for further evaluation. -Dr. Nelson Chimes Notified Dr Leafy Ro about the admission.  -Continue management with aspirin, statin. Stable hematoma near sternotomy, CT surgery aware of this.   -Troponin ordered and done and Trend shows: -Troponin I  went from 59 -> 37 -> 24 -Continue to monitor and cardiothoracic surgery recommending a limited echo and this was done and showed "Limited study to assess EF and AVR. Left ventricular ejection fraction, by  estimation, is 65 to 70%. The left ventricle has normal function. The left ventricle has no regional wall motion abnormalities. The aortic valve has been repaired/replaced. Aortic valve regurgitation is not visualized. There is a 23 mm Edwards Inspiris Resilia Bioprosthetic valve present in the aortic position. Echo findings are consistent with normal structure and function of the aortic valve prosthesis. Aortic valve area, by VTI measures 2.07 cm. Aortic valve mean gradient measures 14.0 mHg. Aortic valve Vmax measures 2.35 m/s. DI 0.47."    Chronic Pain -Continue Gabapentin 300 mg po qHS   GERD/GI Prophylaxis -Continue with PPI   Reactive airway disease Dyspnea -As needed bronchodilators -Will add Flutter Valve and Incentive Spirometry and Guaifenesin 1200 mg po BID -CXR as above -Will consider obtaining V/Q Scan or CTA to rule out PE -May need Pulmonary Evaluation    Insomnia -C/w Trazodone 50 mg po qHS  Normocytic Anemia -Hgb/Hct Trend: Recent Labs  Lab 11/02/22 0413 11/03/22 0111 11/09/22 1015 11/11/22 1525 11/12/22 0501 11/13/22 0309 11/14/22 0030  HGB 7.6* 7.2* 8.2* 8.7* 9.1* 8.2* 7.9*  HCT 24.0* 22.6* 26.7* 28.7* 30.2* 27.1* 25.9*  MCV 87.9 86.9 89.6 89.4 89.9 90.6 90.2  -Check Anemia Panel in the AM -Continue to Monitor for S/Sx of Bleeding; No overt bleeding noted -Repeat CBC in the AM  Thrombocytosis -Patient's Platelet Count: Recent Labs  Lab 11/02/22 0413 11/03/22 0111 11/09/22 1015 11/11/22 1525 11/12/22 0501 11/13/22 0309 11/14/22 0030  PLT 117* 171 517* 602* 548* 506* 495*  -Continue to Monitor and Trend and repeat CMP in the AM   Diabetes Mellitus Type 2, Insulin-Dependent -CBG and Glucose Trend: Recent Labs  Lab 11/01/22 0452 11/01/22 0748 11/02/22 0413 11/02/22 0814 11/03/22 0111 11/03/22 0611 11/09/22 1015 11/11/22 1525 11/11/22 2255 11/13/22 0309 11/13/22 0732 11/14/22 0030 11/14/22 0732 11/14/22 1151  GLUCOSE 138*  --  105*   --  100*  --  143* 144*  --  155*  --  162*  --   --   GLUCAP  --    < >  --    < >  --    < >  --   --    < >  --    < >  --    < > 184*   < > = values in this interval not displayed.  -C/w Levmir 20 units sq Daily and 0-24 Units TIDwm  Hypoalbuminemia -Patient's Albumin Trend: Recent Labs  Lab 10/28/22 1113 11/14/22 0030  ALBUMIN 3.3* 2.6*  -Continue to Monitor and Trend and repeat CMP in the AM  Morbid Obesity -Complicates overall prognosis and care -Estimated body mass index is 48.97 kg/m as calculated from the following:   Height as of this encounter: 5\' 8"  (1.727 m).   Weight as of this encounter: 146.1 kg.  -Weight Loss and Dietary Counseling given   DVT prophylaxis: enoxaparin (LOVENOX) injection 40 mg Start: 11/12/22 1000 SCDs Start: 11/11/22 2116    Code Status: Full Code Family Communication: No family Currently at bedside  Disposition Plan:  Level of care: Telemetry Medical Status is: Observation The patient will require care spanning > 2 midnights and should be moved to inpatient because: Continues to feel dyspneic and renal function continues to be elevated so we will need further workup  Consultants:  Cardiothoracic surgery  Procedures:  As delineated above  Antimicrobials:  Anti-infectives (From admission, onward)    None       Subjective: Seen and examined at bedside and he continues to feel dyspneic and not feel as well today.  Complaining of some chest discomfort along his sternal incision.  The cardiothoracic surgery team feels it is due to his hematoma.  He denies any other concerns or complaints this time.  Objective: Vitals:   11/14/22 1003 11/14/22 1216 11/14/22 1334 11/14/22 1356  BP: 123/76 120/72 (!) 134/110   Pulse: (!) 113 (!) 102 (!) 108   Resp: 16  19   Temp: 97.6 F (36.4 C) 97.7 F (36.5 C) 98 F (36.7 C)   TempSrc:  Oral Oral   SpO2: 98% 100%  100%  Weight:      Height:        Intake/Output Summary (Last 24 hours) at  11/14/2022 1534 Last data filed at 11/14/2022 1337 Gross per 24 hour  Intake 600 ml  Output 120 ml  Net 480 ml   Filed Weights   11/11/22 1511 11/11/22 2258  Weight: (!) 144 kg (!) 146.1 kg   Examination: Physical Exam:  Constitutional: WN/WD, NAD and appears calm and comfortable Respiratory: Diminished to auscultation bilaterally with coarse breath sounds, no wheezing, rales, rhonchi or crackles. Normal respiratory effort and patient is not tachypenic. No accessory muscle use.  Cardiovascular: RRR, no murmurs / rubs / gallops. S1 and S2 auscultated. Mild LE edema Abdomen: Soft, non-tender, Distended 2/2 to body habitus. Bowel sounds positive.  GU: Deferred. Musculoskeletal: No clubbing / cyanosis of digits/nails. No joint deformity upper and lower extremities.   Skin: Midsternal Incision and has associated Hematoma Neurologic: CN 2-12 grossly intact with no focal deficits.  Romberg sign and cerebellar reflexes not assessed.  Psychiatric: Normal judgment and insight. Alert and oriented x 3. Normal mood and appropriate affect.   Data Reviewed: I have personally reviewed following labs and imaging studies  CBC: Recent Labs  Lab 11/09/22 1015 11/11/22 1525 11/12/22 0501 11/13/22 0309 11/14/22 0030  WBC 15.7* 15.9* 11.0* 12.1* 10.6*  NEUTROABS  --   --   --   --  7.7  HGB 8.2* 8.7* 9.1* 8.2* 7.9*  HCT 26.7* 28.7* 30.2* 27.1* 25.9*  MCV 89.6 89.4 89.9 90.6 90.2  PLT 517* 602* 548* 506* 495*   Basic Metabolic Panel: Recent Labs  Lab 11/09/22 1015 11/11/22 1525 11/12/22 0501 11/13/22 0309 11/14/22 0030  NA 134* 137  --  136 137  K 4.3 5.1  --  4.1 4.1  CL 97* 99  --  99 99  CO2 23 25  --  24 29  GLUCOSE 143* 144*  --  155* 162*  BUN 21* 15  --  12 14  CREATININE 1.41* 1.54* 1.48* 1.49* 1.67*  CALCIUM 8.5* 8.7*  --  8.6* 8.7*  MG  --   --   --  1.8 1.9  PHOS  --   --   --   --  3.8   GFR: Estimated Creatinine Clearance: 68.7 mL/min (A) (by C-G formula based on SCr  of 1.67 mg/dL (H)). Liver Function Tests: Recent Labs  Lab 11/14/22 0030  AST 18  ALT 14  ALKPHOS 104  BILITOT 0.7  PROT 6.7  ALBUMIN 2.6*   No results for input(s): "LIPASE", "AMYLASE" in the last 168 hours. No results for input(s): "AMMONIA" in the last 168 hours. Coagulation Profile: No results  for input(s): "INR", "PROTIME" in the last 168 hours. Cardiac Enzymes: No results for input(s): "CKTOTAL", "CKMB", "CKMBINDEX", "TROPONINI" in the last 168 hours. BNP (last 3 results) No results for input(s): "PROBNP" in the last 8760 hours. HbA1C: No results for input(s): "HGBA1C" in the last 72 hours. CBG: Recent Labs  Lab 11/13/22 1210 11/13/22 1558 11/13/22 2202 11/14/22 0732 11/14/22 1151  GLUCAP 135* 156* 158* 155* 184*   Lipid Profile: No results for input(s): "CHOL", "HDL", "LDLCALC", "TRIG", "CHOLHDL", "LDLDIRECT" in the last 72 hours. Thyroid Function Tests: Recent Labs    11/12/22 0901  TSH 7.559*   Anemia Panel: Recent Labs    11/12/22 0901 11/13/22 0309  VITAMINB12 212  --   FOLATE  --  11.0   Sepsis Labs: Recent Labs  Lab 11/09/22 1415  LATICACIDVEN 1.8    No results found for this or any previous visit (from the past 240 hour(s)).   Radiology Studies: DG CHEST PORT 1 VIEW  Result Date: 11/14/2022 CLINICAL DATA:  Shortness of breath EXAM: PORTABLE CHEST 1 VIEW COMPARISON:  Previous studies including the examination of 11/13/2022 FINDINGS: Transverse diameter of heart is increased. There are no signs of alveolar pulmonary edema or focal pulmonary consolidation. Metallic sutures are seen in sternum. Lobulation is seen in the left margin of mediastinum has not changed. There is haziness in both lower lung fields with blunting of left lateral CP angle. There is no pneumothorax. IMPRESSION: Cardiomegaly. There are no signs of pulmonary edema or new focal infiltrates. Increased density in left lower lung field may suggest pleural effusion and possibly  underlying atelectasis. Haziness in the right lower lung fields suggest layering small effusion. Electronically Signed   By: Ernie Avena M.D.   On: 11/14/2022 14:25   ECHOCARDIOGRAM LIMITED  Result Date: 11/14/2022    ECHOCARDIOGRAM LIMITED REPORT   Patient Name:   Brett Marshall [facility] Date of Exam: 11/14/2022 Medical Rec #:  [id number]       Height:       68.0 in Accession #:    [id number]      Weight:       322.1 lb Date of Birth:  07-Nov-1965       BSA:          2.503 m Patient Age:    57 years        BP:           123/76 mmHg Patient Gender: M               HR:           106 bpm. Exam Location:  Inpatient Procedure: Limited Echo, Color Doppler and Cardiac Doppler Indications:    Post AVR  History:        Patient has prior history of Echocardiogram examinations, most                 recent 10/30/2022. CHF, Arrythmias:Atrial Fibrillation; Risk                 Factors:Hypertension, Diabetes, Dyslipidemia and Sleep Apnea.                 10/30/22 Septal Myomectomy and AVR with 23mm Edwards Inspiris                 Resilia Bioprosthetic.                 Aortic Valve: 23 mm Edwards Inspiris Resilia Bioprosthetic valve  is present in the aortic position.  Sonographer:    Irving Burton Senior RDCS Referring Phys: [id number] Kateri Mc LATIF [facility]  Sonographer Comments: Very difficult windows, 2 weeks post AVR IMPRESSIONS  1. Limited study to assess EF and AVR.  2. Left ventricular ejection fraction, by estimation, is 65 to 70%. The left ventricle has normal function. The left ventricle has no regional wall motion abnormalities.  3. The aortic valve has been repaired/replaced. Aortic valve regurgitation is not visualized. There is a 23 mm Edwards Inspiris Resilia Bioprosthetic valve present in the aortic position. Echo findings are consistent with normal structure and function of the aortic valve prosthesis. Aortic valve area, by VTI measures 2.07 cm. Aortic valve mean gradient measures 14.0 mmHg. Aortic valve Vmax  measures 2.35 m/s. DI 0.47 FINDINGS  Left Ventricle: Left ventricular ejection fraction, by estimation, is 65 to 70%. The left ventricle has normal function. The left ventricle has no regional wall motion abnormalities. Definity contrast agent was given IV to delineate the left ventricular  endocardial borders. Aortic Valve: The aortic valve has been repaired/replaced. Aortic valve regurgitation is not visualized. Aortic valve mean gradient measures 14.0 mmHg. Aortic valve peak gradient measures 22.0 mmHg. Aortic valve area, by VTI measures 2.07 cm. There is a  23 mm Edwards Inspiris Resilia Bioprosthetic valve present in the aortic position. Echo findings are consistent with normal structure and function of the aortic valve prosthesis. Additional Comments: Spectral Doppler performed. Color Doppler performed.  LEFT VENTRICLE PLAX 2D LVOT diam:     2.10 cm LV SV:         74 LV SV Index:   29 LVOT Area:     3.46 cm  AORTIC VALVE AV Area (Vmax):    1.67 cm AV Area (Vmean):   1.86 cm AV Area (VTI):     2.07 cm AV Vmax:           234.50 cm/s AV Vmean:          176.500 cm/s AV VTI:            0.357 m AV Peak Grad:      22.0 mmHg AV Mean Grad:      14.0 mmHg LVOT Vmax:         113.00 cm/s LVOT Vmean:        94.900 cm/s LVOT VTI:          0.213 m LVOT/AV VTI ratio: 0.60  SHUNTS Systemic VTI:  0.21 m Systemic Diam: 2.10 cm Laurance Flatten MD Electronically signed by Laurance Flatten MD Signature Date/Time: 11/14/2022/12:49:59 PM    Final    DG CHEST PORT 1 VIEW  Result Date: 11/13/2022 CLINICAL DATA:  Shortness of breath EXAM: PORTABLE CHEST 1 VIEW COMPARISON:  X-ray and CT angiogram chest 11/11/2022 FINDINGS: Status post median sternotomy with enlarged cardiopericardial silhouette. There is masslike enlargement of the mediastinum. Please correlate with prior CT scan describing the 6.4 cm collection. Question tiny left effusion with some adjacent opacity. No pneumothorax or edema. IMPRESSION: Persistent widened  mediastinum with masslike changes. Please correlate with the prior CT. Small left effusion with the adjacent lung opacity, unchanged from prior x-ray. Recommend continued follow-up Electronically Signed   By: Karen Kays M.D.   On: 11/13/2022 16:32    Scheduled Meds:  aspirin EC  325 mg Oral Daily   atorvastatin  40 mg Oral q1800   cholecalciferol  1,000 Units Oral Daily   DULoxetine  120 mg Oral QHS   enoxaparin (LOVENOX)  injection  40 mg Subcutaneous Q24H   famotidine  40 mg Oral QHS   gabapentin  300 mg Oral QHS   insulin aspart  0-24 Units Subcutaneous TID WC   insulin detemir  20 Units Subcutaneous Daily   mometasone-formoterol  2 puff Inhalation BID   multivitamin with minerals  1 tablet Oral Daily   traZODone  50 mg Oral QHS   Continuous Infusions:  sodium chloride 75 mL/hr at 11/14/22 1013    LOS: 0 days   Marguerita Merles, DO [facility] Available via Epic secure chat 7am-7pm After these hours, please refer to coverage provider listed on [email or web address] 11/14/2022, 3:34 PM

## 2022-11-14 NOTE — Progress Notes (Signed)
Echocardiogram 2D Echocardiogram has been performed.  Warren Lacy Bryanah Sidell RDCS 11/14/2022, 11:46 AM

## 2022-11-15 ENCOUNTER — Inpatient Hospital Stay (HOSPITAL_COMMUNITY): Payer: BLUE CROSS/BLUE SHIELD

## 2022-11-15 DIAGNOSIS — R531 Weakness: Secondary | ICD-10-CM | POA: Diagnosis not present

## 2022-11-15 DIAGNOSIS — R627 Adult failure to thrive: Secondary | ICD-10-CM | POA: Diagnosis not present

## 2022-11-15 DIAGNOSIS — R0600 Dyspnea, unspecified: Secondary | ICD-10-CM | POA: Diagnosis not present

## 2022-11-15 LAB — PHOSPHORUS: Phosphorus: 3.3 mg/dL (ref 2.5–4.6)

## 2022-11-15 LAB — COMPREHENSIVE METABOLIC PANEL
ALT: 14 U/L (ref 0–44)
AST: 16 U/L (ref 15–41)
Albumin: 2.5 g/dL — ABNORMAL LOW (ref 3.5–5.0)
Alkaline Phosphatase: 99 U/L (ref 38–126)
Anion gap: 9 (ref 5–15)
BUN: 11 mg/dL (ref 6–20)
CO2: 28 mmol/L (ref 22–32)
Calcium: 8.7 mg/dL — ABNORMAL LOW (ref 8.9–10.3)
Chloride: 102 mmol/L (ref 98–111)
Creatinine, Ser: 1.38 mg/dL — ABNORMAL HIGH (ref 0.61–1.24)
GFR, Estimated: 60 mL/min — ABNORMAL LOW (ref >60)
Glucose, Bld: 189 mg/dL — ABNORMAL HIGH (ref 70–99)
Potassium: 4.6 mmol/L (ref 3.5–5.1)
Sodium: 139 mmol/L (ref 135–145)
Total Bilirubin: 0.7 mg/dL (ref 0.3–1.2)
Total Protein: 6.5 g/dL (ref 6.5–8.1)

## 2022-11-15 LAB — GLUCOSE, CAPILLARY
Glucose-Capillary: 179 mg/dL — ABNORMAL HIGH (ref 70–99)
Glucose-Capillary: 289 mg/dL — ABNORMAL HIGH (ref 70–99)
Glucose-Capillary: 132 mg/dL — ABNORMAL HIGH (ref 70–99)
Glucose-Capillary: 154 mg/dL — ABNORMAL HIGH (ref 70–99)

## 2022-11-15 LAB — CBC WITH DIFFERENTIAL/PLATELET
Abs Immature Granulocytes: 0.03 10*3/uL (ref 0.00–0.07)
Basophils Absolute: 0.1 10*3/uL (ref 0.0–0.1)
Basophils Relative: 1 %
Eosinophils Absolute: 0.2 10*3/uL (ref 0.0–0.5)
Eosinophils Relative: 2 %
HCT: 25.8 % — ABNORMAL LOW (ref 39.0–52.0)
Hemoglobin: 7.7 g/dL — ABNORMAL LOW (ref 13.0–17.0)
Immature Granulocytes: 0 %
Lymphocytes Relative: 16 %
Lymphs Abs: 1.6 10*3/uL (ref 0.7–4.0)
MCH: 27.8 pg (ref 26.0–34.0)
MCHC: 29.8 g/dL — ABNORMAL LOW (ref 30.0–36.0)
MCV: 93.1 fL (ref 80.0–100.0)
Monocytes Absolute: 0.8 10*3/uL (ref 0.1–1.0)
Monocytes Relative: 8 %
Neutro Abs: 7.4 10*3/uL (ref 1.7–7.7)
Neutrophils Relative %: 73 %
Platelets: 489 10*3/uL — ABNORMAL HIGH (ref 150–400)
RBC: 2.77 MIL/uL — ABNORMAL LOW (ref 4.22–5.81)
RDW: 16.2 % — ABNORMAL HIGH (ref 11.5–15.5)
WBC: 10.2 10*3/uL (ref 4.0–10.5)
nRBC: 0.4 % — ABNORMAL HIGH (ref 0.0–0.2)

## 2022-11-15 LAB — MAGNESIUM: Magnesium: 2 mg/dL (ref 1.7–2.4)

## 2022-11-15 MED ORDER — ARFORMOTEROL TARTRATE 15 MCG/2ML IN NEBU
15.0000 ug | INHALATION_SOLUTION | Freq: Two times a day (BID) | RESPIRATORY_TRACT | Status: DC
  Administered 2022-11-15 – 2022-11-21 (×11): 15 ug via RESPIRATORY_TRACT
  Filled 2022-11-15 (×11): qty 2

## 2022-11-15 MED ORDER — BUDESONIDE 0.25 MG/2ML IN SUSP
0.2500 mg | Freq: Two times a day (BID) | RESPIRATORY_TRACT | Status: DC
  Administered 2022-11-15 – 2022-11-21 (×11): 0.25 mg via RESPIRATORY_TRACT
  Filled 2022-11-15 (×11): qty 2

## 2022-11-15 MED ORDER — ALUM & MAG HYDROXIDE-SIMETH 200-200-20 MG/5ML PO SUSP
30.0000 mL | ORAL | Status: DC | PRN
  Administered 2022-11-16 – 2022-11-21 (×2): 30 mL via ORAL
  Filled 2022-11-15 (×3): qty 30

## 2022-11-15 MED ORDER — FUROSEMIDE 10 MG/ML IJ SOLN
40.0000 mg | Freq: Once | INTRAMUSCULAR | Status: AC
  Administered 2022-11-15: 40 mg via INTRAVENOUS
  Filled 2022-11-15: qty 4

## 2022-11-15 NOTE — Consult Note (Signed)
NAME:  Brett Marshall, MRN:  [id number], DOB:  12-30-1965, LOS: 1 ADMISSION DATE:  11/11/2022, CONSULTATION DATE: 11/15/2022 REFERRING MD: Dr. Marland Mcalpine, CHIEF COMPLAINT: Noninvasive ventilation  History of Present Illness:  This is a 57 year old gentleman, past medical history of severe AAS, coronary artery disease, diastolic heart failure, OSA, type 2 diabetes presented for a planned AVR in June 2024.  Postop was admitted to the intensive care unit was cared for by IR team.  Has asthma chronic diastolic heart failure and mild pulmonary hypertension.Was discharged from the hospital on 11/02/2022.  Since his hospital discharge from his surgery he has been seen in the emergency department 2 times and admitted to the hospital.  This admission was for chest pain had a prior heart catheterization coronary disease, valve replacement doing well uncomplicated course.  However just has ongoing failure to thrive symptoms with weakness and shortness of breath.  He was found to have mild AKI.  Pulmonary was consulted  Pertinent  Medical History   Past Medical History:  Diagnosis Date   Asthma    CAD (coronary artery disease)    a. 08/2017 Cath: Diff nonobs dzs, EF 45-50%, diff HK; b. 08/2021 MV: Ant/apical ishemia; c. 08/2021 PCI: LAD 95ost (4.0x15 Onyx Frontier DES), EF 55-65%; c. 09/2021 Cath: LM nl, LAD patent stent, 59m, 30d, RI min irregs, LCX min irregs, RCA 50p. LVOT grad 50-73mmHg (rest), 70-157mmHg (provoked).   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 08/2017 LV gram: EF 45-50%, glob HK; b. 08/2021 Echo: EF 60-65%, no rwma, GrI DD, nl RV fxn; c. 09/2021 cMRI: EF 60%, no LVOT obstruction/gradient. Mild conc LVH, mild AS. No myocardial scar/fibrosis. No evidence of HCM.    Hyperlipidemia    Leg swelling    Left   Low back pain    Mixed ICM & NICM    a. 08/2017 LV gram: EF 45-50%, glob HK; b. 08/2021 Echo: EF 60-65%.   Morbid obesity (HCC)    OSA on CPAP    non compliant   Pulmonary hypertension (HCC)      Significant Hospital Events: Including procedures, antibiotic start and stop dates in addition to other pertinent events     Interim History / Subjective:  Patient has been anxious since he left the hospital.  Gets dyspneic with minimal activity.  He feels like he has some chest tightness.  Has been told he has asthma in the past.  Objective   Blood pressure (!) 148/91, pulse (!) 110, temperature 98.6 F (37 C), resp. rate 18, height 5\' 8"  (1.727 m), weight (!) 146.1 kg, SpO2 99 %.    FiO2 (%):  [21 %] 21 %   Intake/Output Summary (Last 24 hours) at 11/15/2022 1327 Last data filed at 11/15/2022 0600 Gross per 24 hour  Intake 1545.99 ml  Output 120 ml  Net 1425.99 ml   Filed Weights   11/11/22 1511 11/11/22 2258  Weight: (!) 144 kg (!) 146.1 kg    Examination: General: Obese gentleman, resting comfortably in chair no distress HENT: Large neck, NCAT tracking appropriately Lungs: Clear to auscultation bilaterally no crackles no wheeze Chest: Midline well-healed incision Cardiovascular: Regular rate rhythm S1-S2 Abdomen: Obese, soft nontender nondistended Extremities:  Trace bilateral edema Neuro: Alert oriented following commands no deficit GU: Deferred  Resolved Hospital Problem list     Assessment & Plan:   Shortness of breath, dyspnea on exertion Multifactorial etiology, morbid obesity BMI 48, recent chest surgery, loss of chest wall compliance, questionable history of asthma has been on inhalers in the past, slightly volume up, bilateral lower extremity edema.  Plan: Switch Dulera to nebulized Pulmicort and Brovana Continue as needed DuoNebs Lasix 40 mg x 1 Overall I think he is physically deconditioned after being in the  hospital and is morbidly obese. He may benefit from rehab. Would continue to diurese him to maintain euvolemia Would likely benefit from outpatient PSG at some point.  His hemoglobin is low but I am not sure that this playing a role in his shortness of breath at this time.  But with his baseline history of coronary disease could consider blood transfusion and follow that with Lasix to see if it helps with his shortness of breath.     Labs   CBC: Recent Labs  Lab 11/11/22 1525 11/12/22 0501 11/13/22 0309 11/14/22 0030 11/15/22 0020  WBC 15.9* 11.0* 12.1* 10.6* 10.2  NEUTROABS  --   --   --  7.7 7.4  HGB 8.7* 9.1* 8.2* 7.9* 7.7*  HCT 28.7* 30.2* 27.1* 25.9* 25.8*  MCV 89.4 89.9 90.6 90.2 93.1  PLT 602* 548* 506* 495* 489*    Basic Metabolic Panel: Recent Labs  Lab 11/09/22 1015 11/11/22 1525 11/12/22 0501 11/13/22 0309 11/14/22 0030 11/15/22 0020  NA 134* 137  --  136 137 139  K 4.3 5.1  --  4.1 4.1 4.6  CL 97* 99  --  99 99 102  CO2 23 25  --  24 29 28   GLUCOSE 143* 144*  --  155* 162* 189*  BUN 21* 15  --  12 14 11   CREATININE 1.41* 1.54* 1.48* 1.49* 1.67* 1.38*  CALCIUM 8.5* 8.7*  --  8.6* 8.7* 8.7*  MG  --   --   --  1.8 1.9 2.0  PHOS  --   --   --   --  3.8 3.3   GFR: Estimated Creatinine Clearance: 83.1 mL/min (A) (by C-G formula based on SCr of 1.38 mg/dL (H)). Recent Labs  Lab 11/09/22 1415 11/11/22 1525 11/12/22 0501 11/13/22 0309 11/14/22 0030 11/15/22 0020  WBC  --    < > 11.0* 12.1* 10.6* 10.2  LATICACIDVEN 1.8  --   --   --   --   --    < > = values in this interval not displayed.    Liver Function Tests: Recent Labs  Lab 11/14/22 0030 11/15/22 0020  AST 18 16  ALT 14 14  ALKPHOS 104 99  BILITOT 0.7 0.7  PROT 6.7 6.5  ALBUMIN 2.6* 2.5*   No results for input(s): "LIPASE", "AMYLASE" in the last 168 hours. No results for input(s): "AMMONIA" in the last 168 hours.  ABG    Component Value Date/Time   PHART 7.332 (L) 10/30/2022  2104   PCO2ART 37.3 10/30/2022 2104   PO2ART 108 10/30/2022 2104   HCO3 19.9 (L) 10/30/2022 2104   TCO2 21 (L) 10/30/2022 2104   ACIDBASEDEF 6.0 (H) 10/30/2022 2104   O2SAT 98 10/30/2022 2104     Coagulation Profile: No results for input(s): "INR", "PROTIME" in the last 168 hours.  Cardiac Enzymes: No results for input(s): "CKTOTAL", "CKMB", "CKMBINDEX", "TROPONINI" in the last 168 hours.  HbA1C:  Hgb A1c MFr Bld  Date/Time Value Ref Range Status  10/28/2022 11:13 AM 10.0 (H) 4.8 - 5.6 % Final    Comment:    (NOTE) Pre diabetes:          5.7%-6.4%  Diabetes:              >6.4%  Glycemic control for   <7.0% adults with diabetes   09/07/2022 01:08 PM 11.7 (H) 4.8 - 5.6 % Final    Comment:    (NOTE) Pre diabetes:          5.7%-6.4%  Diabetes:              >6.4%  Glycemic control for   <7.0% adults with diabetes     CBG: Recent Labs  Lab 11/14/22 1151 11/14/22 1651 11/14/22 2145 11/15/22 0900 11/15/22 1134  GLUCAP 184* 209* 151* 179* 289*    Review of Systems:   Review of Systems  Constitutional:  Negative for chills, fever, malaise/fatigue and weight loss.  HENT:  Negative for hearing loss, sore throat and tinnitus.   Eyes:  Negative for blurred vision and double vision.  Respiratory:  Positive for shortness of breath. Negative for cough, hemoptysis, sputum production, wheezing and stridor.   Cardiovascular:  Negative for chest pain, palpitations, orthopnea, leg swelling and PND.  Gastrointestinal:  Negative for abdominal pain, constipation, diarrhea, heartburn, nausea and vomiting.  Genitourinary:  Negative for dysuria, hematuria and urgency.  Musculoskeletal:  Negative for joint pain and myalgias.  Skin:  Negative for itching and rash.  Neurological:  Negative for dizziness, tingling, weakness and headaches.  Endo/Heme/Allergies:  Negative for environmental allergies. Does not bruise/bleed easily.  Psychiatric/Behavioral:  Negative for depression. The  patient is nervous/anxious. The patient does not have insomnia.   All other systems reviewed and are negative.    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   Surgical History:   Past Surgical History:  Procedure Laterality Date   AORTIC ROOT ENLARGEMENT N/A 10/30/2022   Procedure: AORTIC ROOT ENLARGEMENT AND SUBAORTIC MYOMECTOMY;  Surgeon: Eugenio Hoes, MD;  Location: [facility] OR;  Service: Open Heart Surgery;  Laterality: N/A;   AORTIC VALVE REPLACEMENT N/A 10/30/2022   Procedure: AORTIC VALVE REPLACEMENT (AVR) USING INSPIRIS RESILIA AORTIC VALVE;  Surgeon: Eugenio Hoes, MD;  Location: [facility] OR;  Service: Open Heart Surgery;  Laterality: N/A;  Median sternotomy   CARDIAC CATHETERIZATION     [place] no stents.   COLONOSCOPY WITH PROPOFOL N/A 04/11/2019   Procedure: COLONOSCOPY WITH PROPOFOL;  Surgeon: Midge Minium, MD;  Location[facility] ENDOSCOPY;  Service: Endoscopy;  Laterality: N/A;   CORONARY STENT INTERVENTION N/A 09/08/2021   Procedure: CORONARY STENT INTERVENTION;  Surgeon: [name], MD;  Location: [facility] INVASIVE CV LAB;  Service: Cardiovascular;  Laterality: N/A;   CORONARY ULTRASOUND/IVUS N/A 09/08/2021   Procedure: Intravascular Ultrasound/IVUS;  Surgeon: [name], MD;  Location: [facility] INVASIVE CV LAB;  Service: Cardiovascular;  Laterality: N/A;   LEFT HEART CATH AND CORONARY ANGIOGRAPHY N/A 09/08/2021   Procedure: LEFT HEART CATH AND CORONARY ANGIOGRAPHY;  Surgeon: [name], MD;  Location: [facility] INVASIVE CV LAB;  Service: Cardiovascular;  Laterality:  N/A;    LEFT HEART CATH AND CORONARY ANGIOGRAPHY N/A 09/17/2021   Procedure: LEFT HEART CATH AND CORONARY ANGIOGRAPHY;  Surgeon: Yvonne Kendall, MD;  Location: [facility] INVASIVE CV LAB;  Service: Cardiovascular;  Laterality: N/A;   LEFT HEART CATH AND CORONARY ANGIOGRAPHY N/A 09/08/2022   Procedure: LEFT HEART CATH AND CORONARY ANGIOGRAPHY;  Surgeon: Yvonne Kendall, MD;  Location: [facility] INVASIVE CV LAB;  Service: Cardiovascular;  Laterality: N/A;   RIGHT/LEFT HEART CATH AND CORONARY ANGIOGRAPHY N/A 09/02/2017   Procedure: RIGHT/LEFT HEART CATH AND CORONARY ANGIOGRAPHY;  Surgeon: [name], MD;  Location: [facility] INVASIVE CV LAB;  Service: Cardiovascular;  Laterality: N/A;   RIGHT/LEFT HEART CATH AND CORONARY ANGIOGRAPHY Bilateral 10/19/2022   Procedure: RIGHT/LEFT HEART CATH AND CORONARY ANGIOGRAPHY;  Surgeon: [name], MD;  Location: [facility] INVASIVE CV LAB;  Service: Cardiovascular;  Laterality: Bilateral;   TEE WITHOUT CARDIOVERSION N/A 09/10/2022   Procedure: TRANSESOPHAGEAL ECHOCARDIOGRAM;  Surgeon: Antonieta Iba, MD;  Location: [facility] ORS;  Service: Cardiovascular;  Laterality: N/A;   TEE WITHOUT CARDIOVERSION N/A 10/30/2022   Procedure: TRANSESOPHAGEAL ECHOCARDIOGRAM;  Surgeon: Eugenio Hoes, MD;  Location: [facility] OR;  Service: Open Heart Surgery;  Laterality: N/A;     Social History:   reports that he has never smoked. He has never used smokeless tobacco. He reports that he does not currently use alcohol. He reports that he does not use drugs.   Family History:  His family history includes Asthma in his mother; Diabetes in his mother; Heart attack in his father; Heart disease in his father; Heart disease (age of onset: 64) in his brother; Sjogren's syndrome in his sister.   Allergies No Known Allergies   Home Medications  Prior to Admission medications   Medication Sig Start Date End Date Taking? Authorizing Provider  acetaminophen (TYLENOL) 325 MG tablet Take 650 mg by mouth every 6  (six) hours as needed for moderate pain.   Yes [provider]  albuterol (VENTOLIN HFA) 108 (90 Base) MCG/ACT inhaler Inhale 1-2 puffs into the lungs every 6 (six) hours as needed for wheezing or shortness of breath. 09/11/20  Yes Glenford Bayley, NP  aspirin EC 325 MG tablet Take 1 tablet (325 mg total) by mouth daily. 11/05/22  Yes Doree Fudge M, PA-C  atorvastatin (LIPITOR) 40 MG tablet Take 1 tablet (40 mg total) by mouth daily at 6 PM. 11/05/22  Yes Doree Fudge M, PA-C  cyclobenzaprine (FLEXERIL) 10 MG tablet Take 1 tablet (10 mg total) by mouth 3 (three) times daily as needed.For spasms Patient taking differently: Take 30 mg by mouth daily as needed for muscle spasms. For spasms 11/20/17  Yes Ethelda Chick, MD  DULoxetine (CYMBALTA) 60 MG capsule Take 120 mg by mouth at bedtime. 08/10/22  Yes [provider]  famotidine (PEPCID) 40 MG tablet Take 40 mg by mouth at bedtime. 08/14/18  Yes [provider]  fluticasone (FLONASE) 50 MCG/ACT nasal spray Place 1 spray into both nostrils daily as needed for allergies or rhinitis.   Yes [provider]  fluticasone-salmeterol (ADVAIR) 250-50 MCG/ACT AEPB Inhale 1 puff into the lungs every 12 (twelve) hours. 01/29/22  Yes Glenford Bayley, NP  gabapentin (NEURONTIN) 300 MG capsule Take 300 mg by mouth at bedtime. 09/04/22 09/04/23 Yes [provider]  insulin glargine (LANTUS) 100 unit/mL SOPN Inject 30 Units into the skin daily. 11/05/22  Yes Joycelyn Man, Donielle M, PA-C  JARDIANCE 25 MG TABS tablet Take 25 mg by mouth daily.  06/06/20  Yes [provider]  lidocaine (LIDODERM) 5 % Place 1 patch onto the skin daily. Remove & Discard patch within 12 hours or as directed by MD. May place on right or left side of chest Patient taking differently: Place 1 patch onto the skin daily as needed (pain). Remove & Discard patch within 12 hours or as directed by MD. May place on right or left side of  chest 11/05/22  Yes Doree Fudge M, PA-C  metFORMIN (GLUCOPHAGE) 1000 MG tablet Take 1 tablet (1,000 mg total) by mouth 2 (two) times daily with a meal. 12/09/17  Yes Ethelda Chick, MD  oxyCODONE (OXY IR/ROXICODONE) 5 MG immediate release tablet Take 1 tablet (5 mg total) by mouth every 6 (six) hours as needed for severe pain. 11/05/22  Yes Doree Fudge M, PA-C  potassium chloride SA (KLOR-CON M) 20 MEQ tablet Take 1 tablet (20 mEq total) by mouth daily. For 10 days then stop. 11/05/22  Yes Doree Fudge M, PA-C  traZODone (DESYREL) 50 MG tablet Take 50 mg by mouth at bedtime. 08/19/21  Yes [provider]  ferrous sulfate 325 (65 FE) MG EC tablet Take 1 tablet (325 mg total) by mouth daily with breakfast. 11/05/22 11/05/23  Ardelle Balls, PA-C  furosemide (LASIX) 40 MG tablet Take 1 tablet (40 mg total) by mouth daily. For 10 days then stop. 11/05/22   Doree Fudge M, PA-C  glucose blood (GE100 BLOOD GLUCOSE TEST) test strip Use as instructed 02/03/16   Ethelda Chick, MD  guaiFENesin (MUCINEX) 600 MG 12 hr tablet Take 1 tablet (600 mg total) by mouth 2 (two) times daily as needed. Patient not taking: Reported on 11/11/2022 11/05/22   Ardelle Balls, PA-C  Lancets [facility] ULTRASOFT) lancets Check sugar twice daily 01/11/16   Ethelda Chick, MD  metoprolol tartrate (LOPRESSOR) 25 MG tablet Take 1 tablet (25 mg total) by mouth 2 (two) times daily. 11/05/22   Ardelle Balls, PA-C   Josephine Igo, DO [facility] 11/15/2022 1:51 PM

## 2022-11-15 NOTE — Progress Notes (Signed)
Patient stated he did not do well with hospital CPAP last night. Will call RT if he decides to wear tonight. [facility] hospital unit at bedside ready for use. 3 lpm nasal cannula running on standby encouraged patient to wear if he chose not to use the CPAP tonight.

## 2022-11-15 NOTE — Progress Notes (Signed)
PROGRESS NOTE    Brett Marshall  RUE:[id number] DOB: Mar 17, 1966 DOA: 11/11/2022 PCP: Ethelda Chick, MD   Brief Narrative:  Patient is a 57 year old morbidly obese Caucasian male with a past medical history significant for but not limited to CAD, diabetes mellitus type 2, CHF, hyperlipidemia, pulm hypertension as well as aortic stenosis status post recent replacement with oral aortic valve replacement on 10/30/2022 by CT surgery.  Operatively he had been having a hard time recovering especially in strength.  He is seen by outpatient cardiothoracic surgery the day before yesterday and given his overall declining condition he was sent to the hospital for further management and assistance and diuretics were held.  There have been concerns of failure to thrive at home.  He had lab work done which showed clinical evidence of dehydration with a mild AKI and a CT of the chest was done and showed postoperative hematoma and bilateral pleural effusion.  CT abdomen pelvis was negative for any acute pathology.  CT surgery was notified and they recommended obtaining echocardiogram which was done and as below..  PT OT recommending home health.  Given that he continues to still be dyspneic pulmonary was consulted for further evaluation and medication changes have been done as below.  Assessment and Plan:  Generalized weakness with adult failure to thrive Dyspnea -Postoperatively patient is having difficult time caring for himself at home.   -TSH was 7.559 and so will check Free T4, cortisol level 16.9, vitamin B12 levels 8.02 vitamin B12 level was 212 and folate levels 11.0 -Vitamin D level was low and so this will be supplemented and nutritionist evaluated and recommending multivitamin encouraging good p.o. intake -ECHO as below  -WBC Trend:  Recent Labs  Lab 11/03/22 0111 11/09/22 1015 11/11/22 1525 11/12/22 0501 11/13/22 0309 11/14/22 0030 11/15/22 0020  WBC 12.6* 15.7* 15.9* 11.0* 12.1* 10.6* 10.2   -DG Chest done yesterday showed "Cardiomegaly. There are no signs of pulmonary edema or new focal infiltrates. Increased density in left lower lung field may suggest pleural effusion and possibly underlying atelectasis. Haziness in the right lower lung fields suggest layering small effusion." -PT/OT recommending Home Health for now but he could benefit from rehab -Nutrition consult. -See below for his dyspnea   Acute Kidney Injury, prerenal Moderate Dehydration, had improved -Baseline creatinine 1.0, admission creatinine 1.54 and now worsened.   --BUN/Cr Trend: Recent Labs  Lab 11/02/22 0413 11/03/22 0111 11/09/22 1015 11/11/22 1525 11/12/22 0501 11/13/22 0309 11/14/22 0030 11/15/22 0020  BUN 17 21* 21* 15  --  12 14 11   CREATININE 1.30* 1.38* 1.41* 1.54* 1.48* 1.49* 1.67* 1.38*  -Gentle IV fluid hydration had stopped and was resumed but now stopped again and he is going to get a dose of IV Lasix 40 mg x 1 per pulmonary -UA done and showed clear appearance with negative bilirubin, greater than 500 glucose, negative hemoglobin, 5 ketones, negative leukocytes, negative nitrites, negative protein, no bacteria seen, 0-5 RBCs per high-power field and 0-5 WBCs but urine osmolality is 654 and urine sodium 26 and urine creatinine 105 -FENa Calculated and was 0.3 and Pre-Renal -Avoid Nephrotoxic Medications, Contrast Dyes, Hypotension and Dehydration to Ensure Adequate Renal Perfusion and will need to Renally Adjust Meds -Continue to Monitor and Trend Renal Function carefully and repeat CMP in the AM  -If worsening will consult Nephrology in the AM   History of CAD status post PCI in 2023 Bicuspid aortic valve status post surgical replacement on October 30, 2022 Chest  Discomfort -CT surgery saw the patient on the day of admission and sent him to the ER for further evaluation. -Dr. Nelson Chimes Notified Dr Leafy Ro about the admission.  -Continue management with aspirin, statin. Stable hematoma near  sternotomy, CT surgery aware of this.   -Had CT scan of the Chest and done and showed "Stable in size and appearance of a 6.4 x 3.7 cm prevascular fluid and gas collection likely representing a hematoma status post median sternotomy and aortic valve repair. Similar-appearing gas and fluid collection along the anterior aspect of the manubrium and manubrium clavicular joints which appears to possibly be contiguous with the pre-vascular collection. No active extravasation of intravenous contrast or increased density within the collection to suggest hemorrhage. No interval peripheral enhancement to suggest abscess formation; although, infection of the sternotomy and anterior mediastinum is not excluded. No cortical erosion or destruction of the sternum to suggest osteomyelitis. Sternotomy wires are intact. Bilateral small volume pleural effusions.. No acute intra-abdominal or intrapelvic abnormality." -Continue with aspirin 325 mg p.o. daily, atorvastatin 40 mg p.o. daily, metoprolol tartrate 5 mg IV every 4 as needed for heart rate greater than 110 -Troponin ordered and done and Trend shows: -Troponin I went from 59 -> 37 -> 24 -Continue to monitor and cardiothoracic surgery recommending a limited echo and this was done and showed "Limited study to assess EF and AVR. Left ventricular ejection fraction, by estimation, is 65 to 70%. The left ventricle has normal function. The left ventricle has no regional wall motion abnormalities. The aortic valve has been repaired/replaced. Aortic valve regurgitation is not visualized. There is a 23 mm Edwards Inspiris Resilia Bioprosthetic valve present in the aortic position. Echo findings are consistent with normal structure and function of the aortic valve prosthesis. Aortic valve area, by VTI measures 2.07 cm. Aortic valve mean gradient measures 14.0 mHg. Aortic valve Vmax measures 2.35 m/s. DI 0.47."  -Given 1 dose of IV Lasix and have now stopped the fluids   Chronic  Pain -Continue Gabapentin 300 mg po qHS   GERD/GI Prophylaxis -Continue with PPI   Reactive Airway Disease Dyspnea on Exertion and Significant SOB -His Dulera was changed by pulmonary  to Budesonide 0.25 mg nebs twice daily as well as arformoterol 15 mcg nebs twice daily nebulized and continuing DuoNebs as needed -Will add Flutter Valve and Incentive Spirometry and Guaifenesin 1200 mg po BID -Will consider obtaining V/Q Scan; Recent CT Scan -Given his persistent dyspnea on exertion pulmonary was consulted for further evaluation recommendations -Pulmonary also giving the patient a dose of IV Lasix 40 mg x 1 and now IV fluids have been discontinued. -Pulmonary recommends continuing to diurese to try and maintain euvolemia and feels that he is very physically deconditioned after being in the hospital and thinks that he would benefit from outpatient PSG at some point -Pulmonary thinks that his anemia may be playing a role in his shortness of breath but is unclear and recommending possibly transfusing followed at least Lasix to see if this shortness of breath helps. -Repeat CXR today showed "Portable AP upright view at 0517 hours. Stable lung volumes and mediastinal contours. Left greater than right lung base veiling opacity is not significantly changed, likely due to ongoing small pleural effusions and associated lower lobe airspace opacity is seen recently by CTA. No pneumothorax or areas of worsening ventilation. Stable visualized osseous structures." -Patient likely has ATX   Insomnia -C/w Trazodone 50 mg po qHS  Normocytic Anemia -Hgb/Hct Trend: Recent Labs  Lab  11/03/22 0111 11/09/22 1015 11/11/22 1525 11/12/22 0501 11/13/22 0309 11/14/22 0030 11/15/22 0020  HGB 7.2* 8.2* 8.7* 9.1* 8.2* 7.9* 7.7*  HCT 22.6* 26.7* 28.7* 30.2* 27.1* 25.9* 25.8*  MCV 86.9 89.6 89.4 89.9 90.6 90.2 93.1  -Check Anemia Panel in the AM -Continue to Monitor for S/Sx of Bleeding; No overt bleeding  noted -Repeat CBC in the AM  Thrombocytosis, improving  -Patient's Platelet Count: Recent Labs  Lab 11/03/22 0111 11/09/22 1015 11/11/22 1525 11/12/22 0501 11/13/22 0309 11/14/22 0030 11/15/22 0020  PLT 171 517* 602* 548* 506* 495* 489*  -Continue to Monitor and Trend and repeat CMP in the AM   Diabetes Mellitus Type 2, Insulin-Dependent -CBG and Glucose Trend: Recent Labs  Lab 11/02/22 0413 11/02/22 0814 11/03/22 0111 11/03/22 0611 11/09/22 1015 11/11/22 1525 11/11/22 2255 11/13/22 0309 11/13/22 0732 11/14/22 0030 11/14/22 0732 11/15/22 0020 11/15/22 0900 11/15/22 1134  GLUCOSE 105*  --  100*  --  143* 144*  --  155*  --  162*  --  189*  --   --   GLUCAP  --    < >  --    < >  --   --    < >  --    < >  --    < >  --    < > 289*   < > = values in this interval not displayed.  -C/w Levmir 20 units sq Daily and 0-24 Units TIDwm  Hypoalbuminemia -Patient's Albumin Trend: Recent Labs  Lab 10/28/22 1113 11/14/22 0030 11/15/22 0020  ALBUMIN 3.3* 2.6* 2.5*  -Continue to Monitor and Trend and repeat CMP in the AM  Morbid Obesity -Complicates overall prognosis and care -Estimated body mass index is 48.97 kg/m as calculated from the following:   Height as of this encounter: 5\' 8"  (1.727 m).   Weight as of this encounter: 146.1 kg.  -Weight Loss and Dietary Counseling given   DVT prophylaxis: enoxaparin (LOVENOX) injection 40 mg Start: 11/12/22 1000 SCDs Start: 11/11/22 2116    Code Status: Full Code Family Communication: No Family currently at bedside  Disposition Plan:  Level of care: Telemetry Medical Status is: Inpatient Remains inpatient appropriate because: Needs further clinical improvement and consulted pulmonary given his continued dyspnea   Consultants:  Cardiothoracic surgery Pulmonary  Procedures:  LTD ECHOCARDIOGRAM IMPRESSIONS     1. Limited study to assess EF and AVR.   2. Left ventricular ejection fraction, by estimation, is 65 to  70%. The  left ventricle has normal function. The left ventricle has no regional  wall motion abnormalities.   3. The aortic valve has been repaired/replaced. Aortic valve  regurgitation is not visualized. There is a 23 mm Edwards Inspiris Resilia  Bioprosthetic valve present in the aortic position. Echo findings are  consistent with normal structure and function  of the aortic valve prosthesis. Aortic valve area, by VTI measures 2.07  cm. Aortic valve mean gradient measures 14.0 mmHg. Aortic valve Vmax  measures 2.35 m/s. DI 0.47   FINDINGS   Left Ventricle: Left ventricular ejection fraction, by estimation, is 65  to 70%. The left ventricle has normal function. The left ventricle has no  regional wall motion abnormalities. Definity contrast agent was given IV  to delineate the left ventricular   endocardial borders.   Aortic Valve: The aortic valve has been repaired/replaced. Aortic valve  regurgitation is not visualized. Aortic valve mean gradient measures 14.0  mmHg. Aortic valve peak gradient measures 22.0  mmHg. Aortic valve area, by  VTI measures 2.07 cm. There is a   23 mm Edwards Inspiris Resilia Bioprosthetic valve present in the aortic  position. Echo findings are consistent with normal structure and function  of the aortic valve prosthesis.   Additional Comments: Spectral Doppler performed. Color Doppler performed.    LEFT VENTRICLE  PLAX 2D  LVOT diam:     2.10 cm  LV SV:         74  LV SV Index:   29  LVOT Area:     3.46 cm     AORTIC VALVE  AV Area (Vmax):    1.67 cm  AV Area (Vmean):   1.86 cm  AV Area (VTI):     2.07 cm  AV Vmax:           234.50 cm/s  AV Vmean:          176.500 cm/s  AV VTI:            0.357 m  AV Peak Grad:      22.0 mmHg  AV Mean Grad:      14.0 mmHg  LVOT Vmax:         113.00 cm/s  LVOT Vmean:        94.900 cm/s  LVOT VTI:          0.213 m  LVOT/AV VTI ratio: 0.60     SHUNTS  Systemic VTI:  0.21 m  Systemic Diam: 2.10 cm    Antimicrobials:  Anti-infectives (From admission, onward)    None       Subjective: Seen and examined at bedside and was having some right-sided chest discomfort today.  Continues to be dyspneic on exertion and states that he cannot walk very far without becoming very dyspneic and having to rest.  No nausea or vomiting.  No other concerns or complaints at this time.  Objective: Vitals:   11/15/22 0830 11/15/22 0859 11/15/22 0900 11/15/22 1516  BP:  (!) 148/91 (!) 148/91 133/68  Pulse: 100 (!) 110 (!) 110 (!) 121  Resp: 20 18 18 17   Temp:  98.6 F (37 C) 98.6 F (37 C) 98.5 F (36.9 C)  TempSrc:    Oral  SpO2: 100% 99% 99% 99%  Weight:      Height:        Intake/Output Summary (Last 24 hours) at 11/15/2022 1543 Last data filed at 11/15/2022 0600 Gross per 24 hour  Intake 1305.99 ml  Output --  Net 1305.99 ml   Filed Weights   11/11/22 1511 11/11/22 2258  Weight: (!) 144 kg (!) 146.1 kg   Examination: Physical Exam:  Constitutional: WN/WD morbidly obese Caucasian male who appears calm sitting in the chair at bedside Respiratory: Diminished to auscultation bilaterally, no wheezing, rales, rhonchi or crackles. Normal respiratory effort and patient is not tachypenic. No accessory muscle use.  Unlabored breathing Cardiovascular: RRR, no murmurs / rubs / gallops. S1 and S2 auscultated.  Has mild to 1+ extremity edema Abdomen: Soft, non-tender, distended secondary to body habitus. Bowel sounds positive.  GU: Deferred. Musculoskeletal: No clubbing / cyanosis of digits/nails. No joint deformity upper and lower extremities.   Skin: No rashes, lesions, ulcers on limited skin evaluation. No induration; Warm and dry.  Neurologic: CN 2-12 grossly intact with no focal deficits. Romberg sign and cerebellar reflexes not assessed.  Psychiatric: Normal judgment and insight. Alert and oriented x 3. Normal mood and appropriate affect.   Data Reviewed:  I have personally reviewed  following labs and imaging studies  CBC: Recent Labs  Lab 11/11/22 1525 11/12/22 0501 11/13/22 0309 11/14/22 0030 11/15/22 0020  WBC 15.9* 11.0* 12.1* 10.6* 10.2  NEUTROABS  --   --   --  7.7 7.4  HGB 8.7* 9.1* 8.2* 7.9* 7.7*  HCT 28.7* 30.2* 27.1* 25.9* 25.8*  MCV 89.4 89.9 90.6 90.2 93.1  PLT 602* 548* 506* 495* 489*   Basic Metabolic Panel: Recent Labs  Lab 11/09/22 1015 11/11/22 1525 11/12/22 0501 11/13/22 0309 11/14/22 0030 11/15/22 0020  NA 134* 137  --  136 137 139  K 4.3 5.1  --  4.1 4.1 4.6  CL 97* 99  --  99 99 102  CO2 23 25  --  24 29 28   GLUCOSE 143* 144*  --  155* 162* 189*  BUN 21* 15  --  12 14 11   CREATININE 1.41* 1.54* 1.48* 1.49* 1.67* 1.38*  CALCIUM 8.5* 8.7*  --  8.6* 8.7* 8.7*  MG  --   --   --  1.8 1.9 2.0  PHOS  --   --   --   --  3.8 3.3   GFR: Estimated Creatinine Clearance: 83.1 mL/min (A) (by C-G formula based on SCr of 1.38 mg/dL (H)). Liver Function Tests: Recent Labs  Lab 11/14/22 0030 11/15/22 0020  AST 18 16  ALT 14 14  ALKPHOS 104 99  BILITOT 0.7 0.7  PROT 6.7 6.5  ALBUMIN 2.6* 2.5*   No results for input(s): "LIPASE", "AMYLASE" in the last 168 hours. No results for input(s): "AMMONIA" in the last 168 hours. Coagulation Profile: No results for input(s): "INR", "PROTIME" in the last 168 hours. Cardiac Enzymes: No results for input(s): "CKTOTAL", "CKMB", "CKMBINDEX", "TROPONINI" in the last 168 hours. BNP (last 3 results) No results for input(s): "PROBNP" in the last 8760 hours. HbA1C: No results for input(s): "HGBA1C" in the last 72 hours. CBG: Recent Labs  Lab 11/14/22 1151 11/14/22 1651 11/14/22 2145 11/15/22 0900 11/15/22 1134  GLUCAP 184* 209* 151* 179* 289*   Lipid Profile: No results for input(s): "CHOL", "HDL", "LDLCALC", "TRIG", "CHOLHDL", "LDLDIRECT" in the last 72 hours. Thyroid Function Tests: No results for input(s): "TSH", "T4TOTAL", "FREET4", "T3FREE", "THYROIDAB" in the last 72 hours. Anemia  Panel: Recent Labs    11/13/22 0309  FOLATE 11.0   Sepsis Labs: Recent Labs  Lab 11/09/22 1415  LATICACIDVEN 1.8   No results found for this or any previous visit (from the past 240 hour(s)).   Radiology Studies: DG CHEST PORT 1 VIEW  Result Date: 11/15/2022 CLINICAL DATA:  57 year old male with shortness of breath. EXAM: PORTABLE CHEST 1 VIEW COMPARISON:  Portable chest 11/14/2022 and earlier. FINDINGS: Portable AP upright view at 0517 hours. Stable lung volumes and mediastinal contours. Left greater than right lung base veiling opacity is not significantly changed, likely due to ongoing small pleural effusions and associated lower lobe airspace opacity is seen recently by CTA. No pneumothorax or areas of worsening ventilation. Stable visualized osseous structures. IMPRESSION: 1. Layering pleural effusions and lower lobe opacity not significantly changed from 11/11/2022. 2. No new cardiopulmonary abnormality. Electronically Signed   By: Odessa Fleming M.D.   On: 11/15/2022 08:38   DG CHEST PORT 1 VIEW  Result Date: 11/14/2022 CLINICAL DATA:  Shortness of breath EXAM: PORTABLE CHEST 1 VIEW COMPARISON:  Previous studies including the examination of 11/13/2022 FINDINGS: Transverse diameter of heart is increased. There are no signs of alveolar pulmonary  edema or focal pulmonary consolidation. Metallic sutures are seen in sternum. Lobulation is seen in the left margin of mediastinum has not changed. There is haziness in both lower lung fields with blunting of left lateral CP angle. There is no pneumothorax. IMPRESSION: Cardiomegaly. There are no signs of pulmonary edema or new focal infiltrates. Increased density in left lower lung field may suggest pleural effusion and possibly underlying atelectasis. Haziness in the right lower lung fields suggest layering small effusion. Electronically Signed   By: Ernie Avena M.D.   On: 11/14/2022 14:25   ECHOCARDIOGRAM LIMITED  Result Date: 11/14/2022     ECHOCARDIOGRAM LIMITED REPORT   Patient Name:   NORVILLE NOTT [facility] Date of Exam: 11/14/2022 Medical Rec #:  [id number]       Height:       68.0 in Accession #:    [id number]      Weight:       322.1 lb Date of Birth:  Jul 22, 1965       BSA:          2.503 m Patient Age:    57 years        BP:           123/76 mmHg Patient Gender: M               HR:           106 bpm. Exam Location:  Inpatient Procedure: Limited Echo, Color Doppler and Cardiac Doppler Indications:    Post AVR  History:        Patient has prior history of Echocardiogram examinations, most                 recent 10/30/2022. CHF, Arrythmias:Atrial Fibrillation; Risk                 Factors:Hypertension, Diabetes, Dyslipidemia and Sleep Apnea.                 10/30/22 Septal Myomectomy and AVR with 23mm Edwards Inspiris                 Resilia Bioprosthetic.                 Aortic Valve: 23 mm Edwards Inspiris Resilia Bioprosthetic valve                 is present in the aortic position.  Sonographer:    Irving Burton Senior RDCS Referring Phys: [id number] Kateri Mc LATIF [facility]  Sonographer Comments: Very difficult windows, 2 weeks post AVR IMPRESSIONS  1. Limited study to assess EF and AVR.  2. Left ventricular ejection fraction, by estimation, is 65 to 70%. The left ventricle has normal function. The left ventricle has no regional wall motion abnormalities.  3. The aortic valve has been repaired/replaced. Aortic valve regurgitation is not visualized. There is a 23 mm Edwards Inspiris Resilia Bioprosthetic valve present in the aortic position. Echo findings are consistent with normal structure and function of the aortic valve prosthesis. Aortic valve area, by VTI measures 2.07 cm. Aortic valve mean gradient measures 14.0 mmHg. Aortic valve Vmax measures 2.35 m/s. DI 0.47 FINDINGS  Left Ventricle: Left ventricular ejection fraction, by estimation, is 65 to 70%. The left ventricle has normal function. The left ventricle has no regional wall motion abnormalities. Definity  contrast agent was given IV to delineate the left ventricular  endocardial borders. Aortic Valve: The aortic valve has been repaired/replaced. Aortic valve regurgitation is not visualized. Aortic valve mean  gradient measures 14.0 mmHg. Aortic valve peak gradient measures 22.0 mmHg. Aortic valve area, by VTI measures 2.07 cm. There is a  23 mm Edwards Inspiris Resilia Bioprosthetic valve present in the aortic position. Echo findings are consistent with normal structure and function of the aortic valve prosthesis. Additional Comments: Spectral Doppler performed. Color Doppler performed.  LEFT VENTRICLE PLAX 2D LVOT diam:     2.10 cm LV SV:         74 LV SV Index:   29 LVOT Area:     3.46 cm  AORTIC VALVE AV Area (Vmax):    1.67 cm AV Area (Vmean):   1.86 cm AV Area (VTI):     2.07 cm AV Vmax:           234.50 cm/s AV Vmean:          176.500 cm/s AV VTI:            0.357 m AV Peak Grad:      22.0 mmHg AV Mean Grad:      14.0 mmHg LVOT Vmax:         113.00 cm/s LVOT Vmean:        94.900 cm/s LVOT VTI:          0.213 m LVOT/AV VTI ratio: 0.60  SHUNTS Systemic VTI:  0.21 m Systemic Diam: 2.10 cm Laurance Flatten MD Electronically signed by Laurance Flatten MD Signature Date/Time: 11/14/2022/12:49:59 PM    Final    DG CHEST PORT 1 VIEW  Result Date: 11/13/2022 CLINICAL DATA:  Shortness of breath EXAM: PORTABLE CHEST 1 VIEW COMPARISON:  X-ray and CT angiogram chest 11/11/2022 FINDINGS: Status post median sternotomy with enlarged cardiopericardial silhouette. There is masslike enlargement of the mediastinum. Please correlate with prior CT scan describing the 6.4 cm collection. Question tiny left effusion with some adjacent opacity. No pneumothorax or edema. IMPRESSION: Persistent widened mediastinum with masslike changes. Please correlate with the prior CT. Small left effusion with the adjacent lung opacity, unchanged from prior x-ray. Recommend continued follow-up Electronically Signed   By: Karen Kays M.D.    On: 11/13/2022 16:32    Scheduled Meds:  arformoterol  15 mcg Nebulization BID   aspirin EC  325 mg Oral Daily   atorvastatin  40 mg Oral q1800   budesonide (PULMICORT) nebulizer solution  0.25 mg Nebulization BID   cholecalciferol  1,000 Units Oral Daily   DULoxetine  120 mg Oral QHS   enoxaparin (LOVENOX) injection  40 mg Subcutaneous Q24H   famotidine  40 mg Oral QHS   furosemide  40 mg Intravenous Once   gabapentin  300 mg Oral QHS   insulin aspart  0-24 Units Subcutaneous TID WC   insulin detemir  20 Units Subcutaneous Daily   multivitamin with minerals  1 tablet Oral Daily   traZODone  50 mg Oral QHS   Continuous Infusions:   LOS: 1 day   Marguerita Merles, DO [facility] Available via Epic secure chat 7am-7pm After these hours, please refer to coverage provider listed on [email or web address] 11/15/2022, 3:43 PM

## 2022-11-16 ENCOUNTER — Inpatient Hospital Stay (HOSPITAL_COMMUNITY): Payer: BLUE CROSS/BLUE SHIELD

## 2022-11-16 DIAGNOSIS — J90 Pleural effusion, not elsewhere classified: Secondary | ICD-10-CM | POA: Diagnosis not present

## 2022-11-16 DIAGNOSIS — D509 Iron deficiency anemia, unspecified: Secondary | ICD-10-CM | POA: Diagnosis not present

## 2022-11-16 DIAGNOSIS — M7989 Other specified soft tissue disorders: Secondary | ICD-10-CM | POA: Diagnosis not present

## 2022-11-16 DIAGNOSIS — R627 Adult failure to thrive: Secondary | ICD-10-CM | POA: Diagnosis not present

## 2022-11-16 LAB — CBC WITH DIFFERENTIAL/PLATELET
Abs Immature Granulocytes: 0.05 10*3/uL (ref 0.00–0.07)
Basophils Absolute: 0.1 10*3/uL (ref 0.0–0.1)
Basophils Relative: 1 %
Eosinophils Absolute: 0.3 10*3/uL (ref 0.0–0.5)
Eosinophils Relative: 3 %
HCT: 27.1 % — ABNORMAL LOW (ref 39.0–52.0)
Hemoglobin: 8.1 g/dL — ABNORMAL LOW (ref 13.0–17.0)
Immature Granulocytes: 0 %
Lymphocytes Relative: 17 %
Lymphs Abs: 1.9 10*3/uL (ref 0.7–4.0)
MCH: 27.7 pg (ref 26.0–34.0)
MCHC: 29.9 g/dL — ABNORMAL LOW (ref 30.0–36.0)
MCV: 92.8 fL (ref 80.0–100.0)
Monocytes Absolute: 0.8 10*3/uL (ref 0.1–1.0)
Monocytes Relative: 7 %
Neutro Abs: 8 10*3/uL — ABNORMAL HIGH (ref 1.7–7.7)
Neutrophils Relative %: 72 %
Platelets: 508 10*3/uL — ABNORMAL HIGH (ref 150–400)
RBC: 2.92 MIL/uL — ABNORMAL LOW (ref 4.22–5.81)
RDW: 15.9 % — ABNORMAL HIGH (ref 11.5–15.5)
WBC: 11.2 10*3/uL — ABNORMAL HIGH (ref 4.0–10.5)
nRBC: 0.5 % — ABNORMAL HIGH (ref 0.0–0.2)

## 2022-11-16 LAB — RETICULOCYTES
Immature Retic Fract: 33.4 % — ABNORMAL HIGH (ref 2.3–15.9)
RBC.: 2.97 MIL/uL — ABNORMAL LOW (ref 4.22–5.81)
Retic Count, Absolute: 162.8 10*3/uL (ref 19.0–186.0)
Retic Ct Pct: 5.5 % — ABNORMAL HIGH (ref 0.4–3.1)

## 2022-11-16 LAB — GLUCOSE, CAPILLARY
Glucose-Capillary: 157 mg/dL — ABNORMAL HIGH (ref 70–99)
Glucose-Capillary: 198 mg/dL — ABNORMAL HIGH (ref 70–99)
Glucose-Capillary: 169 mg/dL — ABNORMAL HIGH (ref 70–99)
Glucose-Capillary: 173 mg/dL — ABNORMAL HIGH (ref 70–99)

## 2022-11-16 LAB — COMPREHENSIVE METABOLIC PANEL
ALT: 13 U/L (ref 0–44)
AST: 17 U/L (ref 15–41)
Albumin: 2.6 g/dL — ABNORMAL LOW (ref 3.5–5.0)
Alkaline Phosphatase: 98 U/L (ref 38–126)
Anion gap: 8 (ref 5–15)
BUN: 12 mg/dL (ref 6–20)
CO2: 26 mmol/L (ref 22–32)
Calcium: 8.5 mg/dL — ABNORMAL LOW (ref 8.9–10.3)
Chloride: 99 mmol/L (ref 98–111)
Creatinine, Ser: 1.23 mg/dL (ref 0.61–1.24)
GFR, Estimated: >60 mL/min (ref >60)
Glucose, Bld: 204 mg/dL — ABNORMAL HIGH (ref 70–99)
Potassium: 4.5 mmol/L (ref 3.5–5.1)
Sodium: 133 mmol/L — ABNORMAL LOW (ref 135–145)
Total Bilirubin: 0.6 mg/dL (ref 0.3–1.2)
Total Protein: 6.9 g/dL (ref 6.5–8.1)

## 2022-11-16 LAB — VITAMIN B12: Vitamin B-12: 211 pg/mL (ref 180–914)

## 2022-11-16 LAB — MAGNESIUM: Magnesium: 1.9 mg/dL (ref 1.7–2.4)

## 2022-11-16 LAB — IRON AND TIBC
Iron: 19 ug/dL — ABNORMAL LOW (ref 45–182)
Saturation Ratios: 7 % — ABNORMAL LOW (ref 17.9–39.5)
TIBC: 279 ug/dL (ref 250–450)
UIBC: 260 ug/dL

## 2022-11-16 LAB — PHOSPHORUS: Phosphorus: 3.1 mg/dL (ref 2.5–4.6)

## 2022-11-16 LAB — FOLATE: Folate: 8.3 ng/mL (ref >5.9)

## 2022-11-16 LAB — FERRITIN: Ferritin: 69 ng/mL (ref 24–336)

## 2022-11-16 MED ORDER — FUROSEMIDE 10 MG/ML IJ SOLN
40.0000 mg | Freq: Two times a day (BID) | INTRAMUSCULAR | Status: DC
  Administered 2022-11-16 – 2022-11-19 (×6): 40 mg via INTRAVENOUS
  Filled 2022-11-16 (×7): qty 4

## 2022-11-16 MED ORDER — POLYSACCHARIDE IRON COMPLEX 150 MG PO CAPS
150.0000 mg | ORAL_CAPSULE | Freq: Every day | ORAL | Status: DC
  Administered 2022-11-16 – 2022-11-21 (×6): 150 mg via ORAL
  Filled 2022-11-16 (×6): qty 1

## 2022-11-16 MED ORDER — MAGNESIUM SULFATE IN D5W 1-5 GM/100ML-% IV SOLN
1.0000 g | Freq: Once | INTRAVENOUS | Status: AC
  Administered 2022-11-16: 1 g via INTRAVENOUS
  Filled 2022-11-16: qty 100

## 2022-11-16 NOTE — Progress Notes (Signed)
VASCULAR LAB    Bilateral lower extremity venous duplex has been performed.  See CV proc for preliminary results.   Shepherd Finnan, RVT 11/16/2022, 4:26 PM

## 2022-11-16 NOTE — Consult Note (Addendum)
NAME:  Brett Marshall, MRN:  [id number], DOB:  1965/06/28, LOS: 2 ADMISSION DATE:  11/11/2022, CONSULTATION DATE: 11/15/2022 REFERRING MD: Dr. Marland Mcalpine, CHIEF COMPLAINT: Noninvasive ventilation  History of Present Illness:  This is a 57 year old gentleman, past medical history of severe AAS, coronary artery disease, diastolic heart failure, OSA, type 2 diabetes presented for a planned AVR in June 2024.  Postop was admitted to the intensive care unit was cared for by IR team.  Has asthma chronic diastolic heart failure and mild pulmonary hypertension.Was discharged from the hospital on 11/02/2022.  Since his hospital discharge from his surgery he has been seen in the emergency department 2 times and admitted to the hospital.  This admission was for chest pain had a prior heart catheterization coronary disease, valve replacement doing well uncomplicated course.  However just has ongoing failure to thrive symptoms with weakness and shortness of breath.  He was found to have mild AKI.  Pulmonary was consulted  Pertinent  Medical History   Past Medical History:  Diagnosis Date   Asthma    CAD (coronary artery disease)    a. 08/2017 Cath: Diff nonobs dzs, EF 45-50%, diff HK; b. 08/2021 MV: Ant/apical ishemia; c. 08/2021 PCI: LAD 95ost (4.0x15 Onyx Frontier DES), EF 55-65%; c. 09/2021 Cath: LM nl, LAD patent stent, 1m, 30d, RI min irregs, LCX min irregs, RCA 50p. LVOT grad 50-33mmHg (rest), 70-153mmHg (provoked).   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 08/2017 LV gram: EF 45-50%, glob HK; b. 08/2021 Echo: EF 60-65%, no rwma, GrI DD, nl RV fxn; c. 09/2021 cMRI: EF 60%, no LVOT obstruction/gradient. Mild conc LVH, mild AS. No myocardial scar/fibrosis. No evidence of HCM.    Hyperlipidemia    Leg swelling    Left   Low back pain    Mixed ICM & NICM    a. 08/2017 LV gram: EF 45-50%, glob HK; b. 08/2021 Echo: EF 60-65%.   Morbid obesity (HCC)    OSA on CPAP    non compliant   Pulmonary hypertension (HCC)      Significant Hospital Events: Including procedures, antibiotic start and stop dates in addition to other pertinent events   Admission   Interim History / Subjective:  Patient has been anxious since he left the hospital.  Gets dyspneic with minimal activity.  He feels like he has some chest tightness.  Has been told he has asthma in the past. States breathing is worse today than yesterday but was not wearing his oxygen today. I placed him on 2 L .Sitting up in chair, has been walking with PT    no I&O charted in Flowsheet  Objective   Blood pressure (!) 123/90, pulse (!) 114, temperature 98.1 F (36.7 C), temperature source Oral, resp. rate 18, height 5\' 8"  (1.727 m), weight (!) 146.1 kg, SpO2 98 %.       No intake or output data in the 24 hours ending 11/16/22 1122  Filed Weights   11/11/22 1511 11/11/22 2258  Weight: (!) 144 kg (!) 146.1 kg    Examination: General: Obese gentleman, resting comfortably in chair no distress, on RA HENT: Thick  neck, NCAT tracking appropriately Lungs: Bilateral chest excursion, distant,  Clear to auscultation  few crackles ,no wheeze Chest: Midline well-healed incision Cardiovascular: Regular rate rhythm S1-S2, ST per tele  Abdomen: Obese, soft nontender nondistended Extremities: 2+  bilateral LE edema Neuro: Alert oriented following commands no deficit GU: Deferred  CXR 11/16/2022 >> Bibasilar patchy and dense left retrocardiac opacities, likely atelectasis. Similar bilateral pleural effusions , cardiomegaly Labs reviewed 11/16/2022 WBC 11.2/ HGB 8.1/platelets 508 Na 133/ K 4.5 Calcium 8.5 corrects with albumin of 2.6  Resolved Hospital Problem list     Assessment & Plan:   Shortness of breath, dyspnea  on exertion Multifactorial etiology, morbid obesity BMI 48, recent chest surgery, loss of chest wall compliance, questionable history of asthma has been on inhalers in the past, slightly volume up, bilateral lower extremity edema.  Plan: Titrate oxygen for sats >94% Continue nebulized Pulmicort and Brovana Maintain Mag > 2 Aggressive Pulmonary Toilet Flutter valve and IS Q 1 while awake Continue as needed DuoNebs Lasix per primary team Consider rehab for physical deconditioned post hospitalization and morbid obesity Continue to diurese  to maintain euvolemia Would likely benefit from outpatient PSG at some point. For anemia, and hx CAD maintain HGB of >8 Strict I&O ordered      Labs   CBC: Recent Labs  Lab 11/12/22 0501 11/13/22 0309 11/14/22 0030 11/15/22 0020 11/16/22 0356  WBC 11.0* 12.1* 10.6* 10.2 11.2*  NEUTROABS  --   --  7.7 7.4 8.0*  HGB 9.1* 8.2* 7.9* 7.7* 8.1*  HCT 30.2* 27.1* 25.9* 25.8* 27.1*  MCV 89.9 90.6 90.2 93.1 92.8  PLT 548* 506* 495* 489* 508*     Basic Metabolic Panel: Recent Labs  Lab 11/11/22 1525 11/12/22 0501 11/13/22 0309 11/14/22 0030 11/15/22 0020 11/16/22 0356  NA 137  --  136 137 139 133*  K 5.1  --  4.1 4.1 4.6 4.5  CL 99  --  99 99 102 99  CO2 25  --  24 29 28 26   GLUCOSE 144*  --  155* 162* 189* 204*  BUN 15  --  12 14 11 12   CREATININE 1.54* 1.48* 1.49* 1.67* 1.38* 1.23  CALCIUM 8.7*  --  8.6* 8.7* 8.7* 8.5*  MG  --   --  1.8 1.9 2.0 1.9  PHOS  --   --   --  3.8 3.3 3.1    GFR: Estimated Creatinine Clearance: 93.3 mL/min (by C-G formula based on SCr of 1.23 mg/dL). Recent Labs  Lab 11/09/22 1415 11/11/22 1525 11/13/22 0309 11/14/22 0030 11/15/22 0020 11/16/22 0356  WBC  --    < > 12.1* 10.6* 10.2 11.2*  LATICACIDVEN 1.8  --   --   --   --   --    < > = values in this interval not displayed.     Liver Function Tests: Recent Labs  Lab 11/14/22 0030 11/15/22 0020 11/16/22 0356  AST 18 16 17   ALT 14 14  13   ALKPHOS 104 99 98  BILITOT 0.7 0.7 0.6  PROT 6.7 6.5 6.9  ALBUMIN 2.6* 2.5* 2.6*    No results for input(s): "LIPASE", "AMYLASE" in the last 168 hours. No results for input(s): "AMMONIA" in the last 168 hours.  ABG    Component Value Date/Time   PHART 7.332 (L) 10/30/2022 2104   PCO2ART 37.3 10/30/2022 2104   PO2ART 108 10/30/2022 2104   HCO3 19.9 (L) 10/30/2022 2104   TCO2 21 (L) 10/30/2022 2104   ACIDBASEDEF 6.0 (H) 10/30/2022 2104   O2SAT  98 10/30/2022 2104     Coagulation Profile: No results for input(s): "INR", "PROTIME" in the last 168 hours.  Cardiac Enzymes: No results for input(s): "CKTOTAL", "CKMB", "CKMBINDEX", "TROPONINI" in the last 168 hours.  HbA1C: Hgb A1c MFr Bld  Date/Time Value Ref Range Status  10/28/2022 11:13 AM 10.0 (H) 4.8 - 5.6 % Final    Comment:    (NOTE) Pre diabetes:          5.7%-6.4%  Diabetes:              >6.4%  Glycemic control for   <7.0% adults with diabetes   09/07/2022 01:08 PM 11.7 (H) 4.8 - 5.6 % Final    Comment:    (NOTE) Pre diabetes:          5.7%-6.4%  Diabetes:              >6.4%  Glycemic control for   <7.0% adults with diabetes     CBG: Recent Labs  Lab 11/14/22 2145 11/15/22 0900 11/15/22 1134 11/15/22 1753 11/15/22 2202  GLUCAP 151* 179* 289* 132* 154*    Allergies No Known Allergies   Home Medications  Prior to Admission medications   Medication Sig Start Date End Date Taking? Authorizing Provider  acetaminophen (TYLENOL) 325 MG tablet Take 650 mg by mouth every 6 (six) hours as needed for moderate pain.   Yes [provider]  albuterol (VENTOLIN HFA) 108 (90 Base) MCG/ACT inhaler Inhale 1-2 puffs into the lungs every 6 (six) hours as needed for wheezing or shortness of breath. 09/11/20  Yes Glenford Bayley, NP  aspirin EC 325 MG tablet Take 1 tablet (325 mg total) by mouth daily. 11/05/22  Yes Doree Fudge M, PA-C  atorvastatin (LIPITOR) 40 MG tablet Take 1 tablet (40 mg  total) by mouth daily at 6 PM. 11/05/22  Yes Doree Fudge M, PA-C  cyclobenzaprine (FLEXERIL) 10 MG tablet Take 1 tablet (10 mg total) by mouth 3 (three) times daily as needed.For spasms Patient taking differently: Take 30 mg by mouth daily as needed for muscle spasms. For spasms 11/20/17  Yes Ethelda Chick, MD  DULoxetine (CYMBALTA) 60 MG capsule Take 120 mg by mouth at bedtime. 08/10/22  Yes [provider]  famotidine (PEPCID) 40 MG tablet Take 40 mg by mouth at bedtime. 08/14/18  Yes [provider]  fluticasone (FLONASE) 50 MCG/ACT nasal spray Place 1 spray into both nostrils daily as needed for allergies or rhinitis.   Yes [provider]  fluticasone-salmeterol (ADVAIR) 250-50 MCG/ACT AEPB Inhale 1 puff into the lungs every 12 (twelve) hours. 01/29/22  Yes Glenford Bayley, NP  gabapentin (NEURONTIN) 300 MG capsule Take 300 mg by mouth at bedtime. 09/04/22 09/04/23 Yes [provider]  insulin glargine (LANTUS) 100 unit/mL SOPN Inject 30 Units into the skin daily. 11/05/22  Yes Joycelyn Man, Donielle M, PA-C  JARDIANCE 25 MG TABS tablet Take 25 mg by mouth daily. 06/06/20  Yes [provider]  lidocaine (LIDODERM) 5 % Place 1 patch onto the skin daily. Remove & Discard patch within 12 hours or as directed by MD. May place on right or left side of chest Patient taking differently: Place 1 patch onto the skin daily as needed (pain). Remove & Discard patch within 12 hours or as directed by MD. May place on right or left side of chest 11/05/22  Yes Doree Fudge M, PA-C  metFORMIN (GLUCOPHAGE) 1000 MG tablet Take 1 tablet (1,000 mg total) by  mouth 2 (two) times daily with a meal. 12/09/17  Yes Ethelda Chick, MD  oxyCODONE (OXY IR/ROXICODONE) 5 MG immediate release tablet Take 1 tablet (5 mg total) by mouth every 6 (six) hours as needed for severe pain. 11/05/22  Yes Doree Fudge M, PA-C  potassium chloride SA (KLOR-CON M) 20 MEQ tablet Take 1  tablet (20 mEq total) by mouth daily. For 10 days then stop. 11/05/22  Yes Doree Fudge M, PA-C  traZODone (DESYREL) 50 MG tablet Take 50 mg by mouth at bedtime. 08/19/21  Yes [provider]  ferrous sulfate 325 (65 FE) MG EC tablet Take 1 tablet (325 mg total) by mouth daily with breakfast. 11/05/22 11/05/23  Ardelle Balls, PA-C  furosemide (LASIX) 40 MG tablet Take 1 tablet (40 mg total) by mouth daily. For 10 days then stop. 11/05/22   Doree Fudge M, PA-C  glucose blood (GE100 BLOOD GLUCOSE TEST) test strip Use as instructed 02/03/16   Ethelda Chick, MD  guaiFENesin (MUCINEX) 600 MG 12 hr tablet Take 1 tablet (600 mg total) by mouth 2 (two) times daily as needed. Patient not taking: Reported on 11/11/2022 11/05/22   Ardelle Balls, PA-C  Lancets [facility] ULTRASOFT) lancets Check sugar twice daily 01/11/16   Ethelda Chick, MD  metoprolol tartrate (LOPRESSOR) 25 MG tablet Take 1 tablet (25 mg total) by mouth 2 (two) times daily. 11/05/22   Ardelle Balls, PA-C    Pulmonary APP Time 25 minutes  Bevelyn Ngo, MSN, AGACNP-BC [facility] Pulmonary/Critical Care Medicine See Amion for personal pager PCCM on call pager [phone]  [place] [facility] 11/16/2022 11:22 AM

## 2022-11-16 NOTE — Progress Notes (Signed)
PROGRESS NOTE    Brett Marshall  RUE:[id number] DOB: 1966/05/06 DOA: 11/11/2022 PCP: Ethelda Chick, MD   Brief Narrative:  Patient is a 57 year old morbidly obese Caucasian male with a past medical history significant for but not limited to CAD, diabetes mellitus type 2, CHF, hyperlipidemia, pulm hypertension as well as aortic stenosis status post recent replacement with oral aortic valve replacement on 10/30/2022 by CT surgery.  Operatively he had been having a hard time recovering especially in strength.  He is seen by outpatient cardiothoracic surgery the day before yesterday and given his overall declining condition he was sent to the hospital for further management and assistance and diuretics were held.  There have been concerns of failure to thrive at home.  He had lab work done which showed clinical evidence of dehydration with a mild AKI and a CT of the chest was done and showed postoperative hematoma and bilateral pleural effusion.  CT abdomen pelvis was negative for any acute pathology.  CT surgery was notified and they recommended obtaining echocardiogram which was done and as below..  PT OT recommending home health.  Given that he continues to still be dyspneic pulmonary was consulted for further evaluation and medication changes have been done as below.  Assessment and Plan:  Generalized weakness with adult failure to thrive Dyspnea -Postoperatively patient is having difficult time caring for himself at home.   -TSH was 7.559 and so will check Free T4, cortisol level 16.9, vitamin B12 levels 8.02 vitamin B12 level was 212 and folate levels 11.0 -Vitamin D level was low and so this will be supplemented and nutritionist evaluated and recommending multivitamin encouraging good p.o. intake -ECHO as below  -WBC Trend:  Recent Labs  Lab 11/09/22 1015 11/11/22 1525 11/12/22 0501 11/13/22 0309 11/14/22 0030 11/15/22 0020 11/16/22 0356  WBC 15.7* 15.9* 11.0* 12.1* 10.6* 10.2 11.2*   -DG Chest done and as below -Check LE Duplex and showing no DVT -PT/OT recommending Home Health for now but he could benefit from rehab -Nutrition consult. -See below for his dyspnea   Acute Kidney Injury, prerenal Moderate Dehydration, had improved -Baseline creatinine 1.0, admission creatinine 1.54 and now worsened.   --BUN/Cr Trend: Recent Labs  Lab 11/03/22 0111 11/09/22 1015 11/11/22 1525 11/12/22 0501 11/13/22 0309 11/14/22 0030 11/15/22 0020 11/16/22 0356  BUN 21* 21* 15  --  12 14 11 12   CREATININE 1.38* 1.41* 1.54* 1.48* 1.49* 1.67* 1.38* 1.23  -Gentle IV fluid hydration had stopped and was resumed but now stopped again and he is going to get a dose of IV Lasix 40 mg x 1 per pulmonary -UA done and showed clear appearance with negative bilirubin, greater than 500 glucose, negative hemoglobin, 5 ketones, negative leukocytes, negative nitrites, negative protein, no bacteria seen, 0-5 RBCs per high-power field and 0-5 WBCs but urine osmolality is 654 and urine sodium 26 and urine creatinine 105 -FENa Calculated and was 0.3 and Pre-Renal -Avoid Nephrotoxic Medications, Contrast Dyes, Hypotension and Dehydration to Ensure Adequate Renal Perfusion and will need to Renally Adjust Meds -Continue to Monitor and Trend Renal Function carefully and repeat CMP in the AM  -If worsening will consult Nephrology in the AM   History of CAD status post PCI in 2023 Bicuspid aortic valve status post surgical replacement on October 30, 2022 Chest Discomfort -CT surgery saw the patient on the day of admission and sent him to the ER for further evaluation. -Dr. Nelson Chimes Notified Dr Leafy Ro about the admission.  -Continue  management with aspirin, statin. Stable hematoma near sternotomy, CT surgery aware of this.   -Had CT scan of the Chest and done and showed "Stable in size and appearance of a 6.4 x 3.7 cm prevascular fluid and gas collection likely representing a hematoma status post  median sternotomy and aortic valve repair. Similar-appearing gas and fluid collection along the anterior aspect of the manubrium and manubrium clavicular joints which appears to possibly be contiguous with the pre-vascular collection. No active extravasation of intravenous contrast or increased density within the collection to suggest hemorrhage. No interval peripheral enhancement to suggest abscess formation; although, infection of the sternotomy and anterior mediastinum is not excluded. No cortical erosion or destruction of the sternum to suggest osteomyelitis. Sternotomy wires are intact. Bilateral small volume pleural effusions.. No acute intra-abdominal or intrapelvic abnormality." -Continue with aspirin 325 mg p.o. daily, atorvastatin 40 mg p.o. daily, metoprolol tartrate 5 mg IV every 4 as needed for heart rate greater than 110 -Troponin ordered and done and Trend shows: -Troponin I went from 59 -> 37 -> 24 -Check BNP in the Am -Continue to monitor and cardiothoracic surgery recommending a limited echo and this was done and showed "Limited study to assess EF and AVR. Left ventricular ejection fraction, by estimation, is 65 to 70%. The left ventricle has normal function. The left ventricle has no regional wall motion abnormalities. The aortic valve has been repaired/replaced. Aortic valve regurgitation is not visualized. There is a 23 mm Edwards Inspiris Resilia Bioprosthetic valve present in the aortic position. Echo findings are consistent with normal structure and function of the aortic valve prosthesis. Aortic valve area, by VTI measures 2.07 cm. Aortic valve mean gradient measures 14.0 mHg. Aortic valve Vmax measures 2.35 m/s. DI 0.47."  -Given 1 dose of IV Lasix and have now stopped the fluids and will renew Diuresis with 40 mg q12h    Chronic Pain -Continue Gabapentin 300 mg po qHS   GERD/GI Prophylaxis -Continue with PPI   Reactive Airway Disease Dyspnea on Exertion and Significant  SOB -His Dulera was changed by pulmonary  to Budesonide 0.25 mg nebs twice daily as well as arformoterol 15 mcg nebs twice daily nebulized and continuing DuoNebs as needed; See above -Now on Yupleri  -Will add Flutter Valve and Incentive Spirometry and Guaifenesin 1200 mg po BID -Will consider obtaining V/Q Scan; Recent CT Scan -Given his persistent dyspnea on exertion pulmonary was consulted for further evaluation recommendations -Pulmonary recommending diuresis and his fluids were stopped yesterday and was given diuresis IV 40 mg not going to IV 40 twice daily -Pulmonary recommends continuing to diurese to try and maintain euvolemia and feels that he is very physically deconditioned after being in the hospital and thinks that he would benefit from outpatient PSG at some point -Pulmonary thinks that his anemia may be playing a role in his shortness of breath but is unclear and recommending possibly transfusing followed at least Lasix to see if this shortness of breath helps. -Repeat CXR today showed "Bibasilar patchy and dense left retrocardiac opacities, likely atelectasis. Aspiration or pneumonia can be considered in the appropriate clinical setting. Similar bilateral pleural effusions. Cardiomegaly." -Patient likely has ATX  Sinus Tachycardia -Unclear reasoning but could be related to anemia and deconditioning -Persistent and will need to continue monitor telemetry -CTA as above -Consider obtaining a V/Q if persistently tachycardic   Insomnia -C/w Trazodone 50 mg po qHS  Normocytic Anemia -Hgb/Hct Trend: Recent Labs  Lab 11/09/22 1015 11/11/22 1525 11/12/22 0501  11/13/22 0309 11/14/22 0030 11/15/22 0020 11/16/22 0356  HGB 8.2* 8.7* 9.1* 8.2* 7.9* 7.7* 8.1*  HCT 26.7* 28.7* 30.2* 27.1* 25.9* 25.8* 27.1*  MCV 89.6 89.4 89.9 90.6 90.2 93.1 92.8  -Checked Anemia Panel and showed an iron level of 19, UIBC of 260, TIBC of 279, saturation ratios of 7%, ferritin level 69, folate of  8.3, vitamin B12 211 -Started po Niferex Supplement  -Continue to Monitor for S/Sx of Bleeding; No overt bleeding noted -Repeat CBC in the AM  Thrombocytosis, improving  -Patient's Platelet Count: Recent Labs  Lab 11/09/22 1015 11/11/22 1525 11/12/22 0501 11/13/22 0309 11/14/22 0030 11/15/22 0020 11/16/22 0356  PLT 517* 602* 548* 506* 495* 489* 508*  -Continue to Monitor and Trend and repeat CMP in the AM   Diabetes Mellitus Type 2, Insulin-Dependent -CBG and Glucose Trend: Recent Labs  Lab 11/03/22 0111 11/03/22 0611 11/09/22 1015 11/11/22 1525 11/11/22 2255 11/13/22 0309 11/13/22 0732 11/14/22 0030 11/14/22 0732 11/15/22 0020 11/15/22 0900 11/16/22 0356 11/16/22 0859 11/16/22 1714  GLUCOSE 100*  --  143* 144*  --  155*  --  162*  --  189*  --  204*  --   --   GLUCAP  --    < >  --   --    < >  --    < >  --    < >  --    < >  --    < > 169*   < > = values in this interval not displayed.  -C/w Levmir 20 units sq Daily and 0-24 Units TIDwm  Hypoalbuminemia -Patient's Albumin Trend: Recent Labs  Lab 10/28/22 1113 11/14/22 0030 11/15/22 0020 11/16/22 0356  ALBUMIN 3.3* 2.6* 2.5* 2.6*  -Continue to Monitor and Trend and repeat CMP in the AM  Morbid Obesity -Complicates overall prognosis and care -Estimated body mass index is 48.97 kg/m as calculated from the following:   Height as of this encounter: 5\' 8"  (1.727 m).   Weight as of this encounter: 146.1 kg.  -Weight Loss and Dietary Counseling given   DVT prophylaxis: enoxaparin (LOVENOX) injection 40 mg Start: 11/12/22 1000 SCDs Start: 11/11/22 2116    Code Status: Full Code Family Communication: Friend at bedside   Disposition Plan:  Level of care: Telemetry Medical Status is: Inpatient Remains inpatient appropriate because: Is persistently dyspneic and needs further clinical improvement and clearance by the specialists.  Pulmonary recommending aggressively diuresing and if not improving considering  obtaining a VQ scan   Consultants:  Cardiothoracic surgery Pulmonary  Procedures:  As delineated as above  ECHOCARDIOGRAM IMPRESSIONS     1. Limited study to assess EF and AVR.   2. Left ventricular ejection fraction, by estimation, is 65 to 70%. The  left ventricle has normal function. The left ventricle has no regional  wall motion abnormalities.   3. The aortic valve has been repaired/replaced. Aortic valve  regurgitation is not visualized. There is a 23 mm Edwards Inspiris Resilia  Bioprosthetic valve present in the aortic position. Echo findings are  consistent with normal structure and function  of the aortic valve prosthesis. Aortic valve area, by VTI measures 2.07  cm. Aortic valve mean gradient measures 14.0 mmHg. Aortic valve Vmax  measures 2.35 m/s. DI 0.47   FINDINGS   Left Ventricle: Left ventricular ejection fraction, by estimation, is 65  to 70%. The left ventricle has normal function. The left ventricle has no  regional wall motion abnormalities. Definity contrast agent  was given IV  to delineate the left ventricular   endocardial borders.   Aortic Valve: The aortic valve has been repaired/replaced. Aortic valve  regurgitation is not visualized. Aortic valve mean gradient measures 14.0  mmHg. Aortic valve peak gradient measures 22.0 mmHg. Aortic valve area, by  VTI measures 2.07 cm. There is a   23 mm Edwards Inspiris Resilia Bioprosthetic valve present in the aortic  position. Echo findings are consistent with normal structure and function  of the aortic valve prosthesis.   Additional Comments: Spectral Doppler performed. Color Doppler performed.    LEFT VENTRICLE  PLAX 2D  LVOT diam:     2.10 cm  LV SV:         74  LV SV Index:   29  LVOT Area:     3.46 cm     AORTIC VALVE  AV Area (Vmax):    1.67 cm  AV Area (Vmean):   1.86 cm  AV Area (VTI):     2.07 cm  AV Vmax:           234.50 cm/s  AV Vmean:          176.500 cm/s  AV VTI:             0.357 m  AV Peak Grad:      22.0 mmHg  AV Mean Grad:      14.0 mmHg  LVOT Vmax:         113.00 cm/s  LVOT Vmean:        94.900 cm/s  LVOT VTI:          0.213 m  LVOT/AV VTI ratio: 0.60     SHUNTS  Systemic VTI:  0.21 m  Systemic Diam: 2.10 cm   Antimicrobials:  Anti-infectives (From admission, onward)    None       Subjective: Seen and examined at bedside remains extremely dyspneic on exertion.  He denies any nausea or vomiting.  States he is continue to have some chest discomfort.  Feels okay otherwise and did not want to wear his CPAP last night given that the mask does not attach to his face properly.  Objective: Vitals:   11/16/22 0840 11/16/22 1000 11/16/22 1257 11/16/22 1714  BP: (!) 134/94 (!) 123/90 108/76 (!) 135/90  Pulse: (!) 128 (!) 114 100 (!) 106  Resp: 17 18 17 17   Temp: 98.1 F (36.7 C)  97.8 F (36.6 C) 98.6 F (37 C)  TempSrc: Oral  Oral Oral  SpO2: 99% 98% 100% 100%  Weight:      Height:       No intake or output data in the 24 hours ending 11/16/22 1820 Filed Weights   11/11/22 1511 11/11/22 2258  Weight: (!) 144 kg (!) 146.1 kg   Examination: Physical Exam:  Constitutional: WN/WD morbidly obese Caucasian male who appears calm significant for bedside masses,  Respiratory: Diminished to auscultation bilaterally with some coarse breath sounds and has some crackles and mild rhonchi. Normal respiratory effort and patient is not tachypenic. No accessory muscle use.  Wearing a mental oxygen via nasal cannula Cardiovascular: RRR, no murmurs / rubs / gallops. S1 and S2 auscultated.  1+ lower extremity edema Abdomen: Soft, non-tender, non-distended. No masses palpated. No appreciable hepatosplenomegaly. Bowel sounds positive.  GU: Deferred. Musculoskeletal: No clubbing / cyanosis of digits/nails. No joint deformity upper and lower extremities.  Skin: No rashes, lesions, ulcers on limited skin evaluation. No induration; Warm and dry.  Neurologic: CN  2-12 grossly intact with no focal deficits. Romberg sign and cerebellar reflexes not assessed.  Psychiatric: Normal judgment and insight. Alert and oriented x 3. Normal mood and appropriate affect.   Data Reviewed: I have personally reviewed following labs and imaging studies  CBC: Recent Labs  Lab 11/12/22 0501 11/13/22 0309 11/14/22 0030 11/15/22 0020 11/16/22 0356  WBC 11.0* 12.1* 10.6* 10.2 11.2*  NEUTROABS  --   --  7.7 7.4 8.0*  HGB 9.1* 8.2* 7.9* 7.7* 8.1*  HCT 30.2* 27.1* 25.9* 25.8* 27.1*  MCV 89.9 90.6 90.2 93.1 92.8  PLT 548* 506* 495* 489* 508*   Basic Metabolic Panel: Recent Labs  Lab 11/11/22 1525 11/12/22 0501 11/13/22 0309 11/14/22 0030 11/15/22 0020 11/16/22 0356  NA 137  --  136 137 139 133*  K 5.1  --  4.1 4.1 4.6 4.5  CL 99  --  99 99 102 99  CO2 25  --  24 29 28 26   GLUCOSE 144*  --  155* 162* 189* 204*  BUN 15  --  12 14 11 12   CREATININE 1.54* 1.48* 1.49* 1.67* 1.38* 1.23  CALCIUM 8.7*  --  8.6* 8.7* 8.7* 8.5*  MG  --   --  1.8 1.9 2.0 1.9  PHOS  --   --   --  3.8 3.3 3.1   GFR: Estimated Creatinine Clearance: 93.3 mL/min (by C-G formula based on SCr of 1.23 mg/dL). Liver Function Tests: Recent Labs  Lab 11/14/22 0030 11/15/22 0020 11/16/22 0356  AST 18 16 17   ALT 14 14 13   ALKPHOS 104 99 98  BILITOT 0.7 0.7 0.6  PROT 6.7 6.5 6.9  ALBUMIN 2.6* 2.5* 2.6*   No results for input(s): "LIPASE", "AMYLASE" in the last 168 hours. No results for input(s): "AMMONIA" in the last 168 hours. Coagulation Profile: No results for input(s): "INR", "PROTIME" in the last 168 hours. Cardiac Enzymes: No results for input(s): "CKTOTAL", "CKMB", "CKMBINDEX", "TROPONINI" in the last 168 hours. BNP (last 3 results) No results for input(s): "PROBNP" in the last 8760 hours. HbA1C: No results for input(s): "HGBA1C" in the last 72 hours. CBG: Recent Labs  Lab 11/15/22 1753 11/15/22 2202 11/16/22 0859 11/16/22 1308 11/16/22 1714  GLUCAP 132* 154* 157*  198* 169*   Lipid Profile: No results for input(s): "CHOL", "HDL", "LDLCALC", "TRIG", "CHOLHDL", "LDLDIRECT" in the last 72 hours. Thyroid Function Tests: No results for input(s): "TSH", "T4TOTAL", "FREET4", "T3FREE", "THYROIDAB" in the last 72 hours. Anemia Panel: Recent Labs    11/16/22 0356  VITAMINB12 211  FOLATE 8.3  FERRITIN 69  TIBC 279  IRON 19*  RETICCTPCT 5.5*   Sepsis Labs: No results for input(s): "PROCALCITON", "LATICACIDVEN" in the last 168 hours.  No results found for this or any previous visit (from the past 240 hour(s)).   Radiology Studies: VAS [place] LOWER EXTREMITY VENOUS (DVT)  Result Date: 11/16/2022  Lower Venous DVT Study Patient Name:  Brett Marshall [facility]  Date of Exam:   11/16/2022 Medical Rec #: [id number]        Accession #:    [id number] Date of Birth: 07/09/1965        Patient Gender: M Patient Age:   89 years Exam Location:  [facility] Procedure:      VAS [place] LOWER EXTREMITY VENOUS (DVT) Referring Phys: Marguerita Merles --------------------------------------------------------------------------------  Indications: Swelling, and SOB.  Limitations: Body habitus (BMI and pain with compression. Comparison Study: Prior negative LEV done 09/07/22 Performing Technologist:  Sherren Kerns RVS  Examination Guidelines: A complete evaluation includes B-mode imaging, spectral Doppler, color Doppler, and power Doppler as needed of all accessible portions of each vessel. Bilateral testing is considered an integral part of a complete examination. Limited examinations for reoccurring indications may be performed as noted. The reflux portion of the exam is performed with the patient in reverse Trendelenburg.  +---------+---------------+---------+-----------+----------+-------------------+ RIGHT    CompressibilityPhasicitySpontaneityPropertiesThrombus Aging      +---------+---------------+---------+-----------+----------+-------------------+ CFV      Full           Yes      Yes                                       +---------+---------------+---------+-----------+----------+-------------------+ SFJ      Full                                                             +---------+---------------+---------+-----------+----------+-------------------+ FV Prox  Full                                                             +---------+---------------+---------+-----------+----------+-------------------+ FV Mid                  Yes      Yes                  patent by color and                                                       Doppler             +---------+---------------+---------+-----------+----------+-------------------+ FV Distal               Yes      Yes                  patent by color and                                                       Doppler             +---------+---------------+---------+-----------+----------+-------------------+ PFV      Full                                                             +---------+---------------+---------+-----------+----------+-------------------+ POP      Full                                                             +---------+---------------+---------+-----------+----------+-------------------+  PTV                                                   Not well visualized +---------+---------------+---------+-----------+----------+-------------------+ PERO                                                  Not well visualized +---------+---------------+---------+-----------+----------+-------------------+   +---------+---------------+---------+-----------+----------+-------------------+ LEFT     CompressibilityPhasicitySpontaneityPropertiesThrombus Aging      +---------+---------------+---------+-----------+----------+-------------------+ CFV      Full           Yes      Yes                                       +---------+---------------+---------+-----------+----------+-------------------+ SFJ      Full                                                             +---------+---------------+---------+-----------+----------+-------------------+ FV Prox  Full                                                             +---------+---------------+---------+-----------+----------+-------------------+ FV Mid   Full                                                             +---------+---------------+---------+-----------+----------+-------------------+ FV DistalFull                                                             +---------+---------------+---------+-----------+----------+-------------------+ PFV      Full                                                             +---------+---------------+---------+-----------+----------+-------------------+ POP      Full           Yes      Yes                                      +---------+---------------+---------+-----------+----------+-------------------+ PTV  patent by color     +---------+---------------+---------+-----------+----------+-------------------+ PERO                                                  Not well visualized +---------+---------------+---------+-----------+----------+-------------------+     Summary: BILATERAL: -No evidence of popliteal cyst, bilaterally. RIGHT: - There is no evidence of deep vein thrombosis in the lower extremity. However, portions of this examination were limited- see technologist comments above.  LEFT: - There is no evidence of deep vein thrombosis in the lower extremity. However, portions of this examination were limited- see technologist comments above.  *See table(s) above for measurements and observations.    Preliminary    DG CHEST PORT 1 VIEW  Result Date: 11/16/2022 CLINICAL DATA:  Shortness of breath. Status post  aortic valve replacement 10/30/2022 EXAM: PORTABLE CHEST 1 VIEW COMPARISON:  Chest radiograph dated 11/15/2022 FINDINGS: Lines/tubes: None. Lungs: Bibasilar patchy and dense left retrocardiac opacities. Pleura: Similar small left pleural effusion and right apicolateral pleural thickening. No pneumothorax. Heart/mediastinum: Enlarged cardiomediastinal silhouette. Bones: Median sternotomy wires are nondisplaced. IMPRESSION: 1. Bibasilar patchy and dense left retrocardiac opacities, likely atelectasis. Aspiration or pneumonia can be considered in the appropriate clinical setting. 2. Similar bilateral pleural effusions. 3. Cardiomegaly. Electronically Signed   By: Agustin Cree M.D.   On: 11/16/2022 08:38   DG CHEST PORT 1 VIEW  Result Date: 11/15/2022 CLINICAL DATA:  57 year old male with shortness of breath. EXAM: PORTABLE CHEST 1 VIEW COMPARISON:  Portable chest 11/14/2022 and earlier. FINDINGS: Portable AP upright view at 0517 hours. Stable lung volumes and mediastinal contours. Left greater than right lung base veiling opacity is not significantly changed, likely due to ongoing small pleural effusions and associated lower lobe airspace opacity is seen recently by CTA. No pneumothorax or areas of worsening ventilation. Stable visualized osseous structures. IMPRESSION: 1. Layering pleural effusions and lower lobe opacity not significantly changed from 11/11/2022. 2. No new cardiopulmonary abnormality. Electronically Signed   By: Odessa Fleming M.D.   On: 11/15/2022 08:38    Scheduled Meds:  arformoterol  15 mcg Nebulization BID   aspirin EC  325 mg Oral Daily   atorvastatin  40 mg Oral q1800   budesonide (PULMICORT) nebulizer solution  0.25 mg Nebulization BID   cholecalciferol  1,000 Units Oral Daily   DULoxetine  120 mg Oral QHS   enoxaparin (LOVENOX) injection  40 mg Subcutaneous Q24H   famotidine  40 mg Oral QHS   furosemide  40 mg Intravenous Q12H   gabapentin  300 mg Oral QHS   insulin aspart  0-24 Units  Subcutaneous TID WC   insulin detemir  20 Units Subcutaneous Daily   iron polysaccharides  150 mg Oral Daily   multivitamin with minerals  1 tablet Oral Daily   traZODone  50 mg Oral QHS   Continuous Infusions:   LOS: 2 days   Marguerita Merles, DO [facility] Available via Epic secure chat 7am-7pm After these hours, please refer to coverage provider listed on [email or web address] 11/16/2022, 6:20 PM

## 2022-11-16 NOTE — Consult Note (Signed)
NAME:  Brett Marshall, MRN:  [id number], DOB:  12-09-1965, LOS: 2 ADMISSION DATE:  11/11/2022, CONSULTATION DATE: 11/15/2022 REFERRING MD: Dr. Marland Mcalpine, CHIEF COMPLAINT: Noninvasive ventilation  History of Present Illness:  This is a 57 year old gentleman, past medical history of severe AAS, coronary artery disease, diastolic heart failure, OSA, type 2 diabetes presented for a planned AVR in June 2024.  Postop was admitted to the intensive care unit was cared for by IR team.  Has asthma chronic diastolic heart failure and mild pulmonary hypertension.Was discharged from the hospital on 11/02/2022.  Since his hospital discharge from his surgery he has been seen in the emergency department 2 times and admitted to the hospital.  This admission was for chest pain had a prior heart catheterization coronary disease, valve replacement doing well uncomplicated course.  However just has ongoing failure to thrive symptoms with weakness and shortness of breath.  He was found to have mild AKI.  Pulmonary was consulted  Pertinent  Medical History   Past Medical History:  Diagnosis Date   Asthma    CAD (coronary artery disease)    a. 08/2017 Cath: Diff nonobs dzs, EF 45-50%, diff HK; b. 08/2021 MV: Ant/apical ishemia; c. 08/2021 PCI: LAD 95ost (4.0x15 Onyx Frontier DES), EF 55-65%; c. 09/2021 Cath: LM nl, LAD patent stent, 40m, 30d, RI min irregs, LCX min irregs, RCA 50p. LVOT grad 50-67mmHg (rest), 70-150mmHg (provoked).   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 08/2017 LV gram: EF 45-50%, glob HK; b. 08/2021 Echo: EF 60-65%, no rwma, GrI DD, nl RV fxn; c. 09/2021 cMRI: EF 60%, no LVOT obstruction/gradient. Mild conc LVH, mild AS. No myocardial scar/fibrosis. No evidence of HCM.    Hyperlipidemia    Leg swelling    Left   Low back pain    Mixed ICM & NICM    a. 08/2017 LV gram: EF 45-50%, glob HK; b. 08/2021 Echo: EF 60-65%.   Morbid obesity (HCC)    OSA on CPAP    non compliant   Pulmonary hypertension (HCC)      Significant Hospital Events: Including procedures, antibiotic start and stop dates in addition to other pertinent events     Interim History / Subjective:  Patient has been anxious since he left the hospital.  Gets dyspneic with minimal activity.  He feels like he has some chest tightness.  Has been told he has asthma in the past.  Objective   Blood pressure (!) 123/90, pulse (!) 114, temperature 98.1 F (36.7 C), temperature source Oral, resp. rate 18, height 5\' 8"  (1.727 m), weight (!) 146.1 kg, SpO2 98 %.       No intake or output data in the 24 hours ending 11/16/22 1145  Filed Weights   11/11/22 1511 11/11/22 2258  Weight: (!) 144 kg (!) 146.1 kg    Examination: General: chronically ill appearing man sitting up in the chair in NAD HENT: [place]/AT, eyes anicteric Lungs: breathing comfortably on [place], distant lung sounds. No conversational dyspnea.  Cardiovascular: tachycardic, reg rhythm Abdomen: obese, ND Extremities: pitting edema to knees, no clubbing Neuro: awake, alert, moving all extremities Derm: pallor, warm, dry, no diffuse rashes  BNP 310 on 6/24 CTA  chest personally reviewed from 11/11/22: No PE. Cardiomegaly. Bilateral dependent effusions with overlying compressive atelectasis.    Resolved Hospital Problem list     Assessment & Plan:   Shortness of breath, dyspnea on exertion- Multifactorial etiology. Acutely anemia and hypervolemia with bilateral pleural effusions are contributing most with contribution from deconditioning. He seems to have a component of bronchospasm if he improves with nebs.  Restriction from cardiomegaly and obesity BMI 48, recent chest surgery with loss of chest wall compliance, OSA likely contribute  chronically.  Sinus tachycardia-- unsure if this is anemia; worry about a very small PE that is too small to detect on CT scan that could be contributing as well, although CT scans are good at detecting most PEs.  IDA -Con't brovana and yupelri; at discharge can use LAMA/LABA inhaler.  -duonebs PRN -lasix BID- d/w Dr. Marland Mcalpine -LE [place] to r/o DVTs -PT, OT; recommend OP rehab regimen -recommend compliance with CPAP -iron supplementation recommended     Labs   CBC: Recent Labs  Lab 11/12/22 0501 11/13/22 0309 11/14/22 0030 11/15/22 0020 11/16/22 0356  WBC 11.0* 12.1* 10.6* 10.2 11.2*  NEUTROABS  --   --  7.7 7.4 8.0*  HGB 9.1* 8.2* 7.9* 7.7* 8.1*  HCT 30.2* 27.1* 25.9* 25.8* 27.1*  MCV 89.9 90.6 90.2 93.1 92.8  PLT 548* 506* 495* 489* 508*     Basic Metabolic Panel: Recent Labs  Lab 11/11/22 1525 11/12/22 0501 11/13/22 0309 11/14/22 0030 11/15/22 0020 11/16/22 0356  NA 137  --  136 137 139 133*  K 5.1  --  4.1 4.1 4.6 4.5  CL 99  --  99 99 102 99  CO2 25  --  24 29 28 26   GLUCOSE 144*  --  155* 162* 189* 204*  BUN 15  --  12 14 11 12   CREATININE 1.54* 1.48* 1.49* 1.67* 1.38* 1.23  CALCIUM 8.7*  --  8.6* 8.7* 8.7* 8.5*  MG  --   --  1.8 1.9 2.0 1.9  PHOS  --   --   --  3.8 3.3 3.1    GFR: Estimated Creatinine Clearance: 93.3 mL/min (by C-G formula based on SCr of 1.23 mg/dL). Recent Labs  Lab 11/09/22 1415 11/11/22 1525 11/13/22 0309 11/14/22 0030 11/15/22 0020 11/16/22 0356  WBC  --    < > 12.1* 10.6* 10.2 11.2*  LATICACIDVEN 1.8  --   --   --   --   --    < > = values in this interval not displayed.       Steffanie Dunn, DO 11/16/22 1:09 PM [facility]  For contact information, see Amion. If no response to pager, please call PCCM consult pager. After hours, 7PM- 7AM, please call Elink.

## 2022-11-16 NOTE — Progress Notes (Signed)
Physical Therapy Treatment Patient Details Name: Brett Marshall MRN: [id number] DOB: 23-Feb-1966 Today's Date: 11/16/2022   History of Present Illness Pt is 57 year old presents to [facility] on 6/26 with failure to thrive in post operative setting at home after office visit on 6/26.  He reported a fall on 6/24 and was seen in ED at [facility] but sent home. Patient denies hitting head but reports pain near sternal incision (AVR 10/30/22). Patient's spouse stated that he had fallen on his buttocks but now is complaining of difficulty breathing and only taking shallow breaths. PMH - Recent admission on 10/30/22 for AVR, CAD, Aortic stenosis, DM, cardiomyopathy, morbid obesity, pulmonary HTN, depression    PT Comments    Pt tolerated treatment well today. Pt was able to progress ambulation distance in hallway today however continues to report dizziness and chest pain with mobility. No change in DC/DME recs at this time. PT will continue to follow.   Recommendations for follow up therapy are one component of a multi-disciplinary discharge planning process, led by the attending physician.  Recommendations may be updated based on patient status, additional functional criteria and insurance authorization.  Follow Up Recommendations       Assistance Recommended at Discharge Intermittent Supervision/Assistance  Patient can return home with the following A little help with walking and/or transfers;A lot of help with bathing/dressing/bathroom;Assistance with cooking/housework;Assist for transportation;Help with stairs or ramp for entrance   Equipment Recommendations  None recommended by PT    Recommendations for Other Services       Precautions / Restrictions Precautions Precautions: Fall;Sternal;Other (comment) Precaution Booklet Issued: No Precaution Comments: watch BP, pt also reports some hx vertigo Restrictions Weight Bearing Restrictions: Yes RUE Weight Bearing: Touch down weight bearing LUE Weight  Bearing: Touch down weight bearing     Mobility  Bed Mobility               General bed mobility comments: up in chair upon arrival    Transfers Overall transfer level: Independent Equipment used: None Transfers: Sit to/from Stand Sit to Stand: Independent           General transfer comment: Maintained sternal precautions    Ambulation/Gait Ambulation/Gait assistance: Supervision Gait Distance (Feet): 250 Feet Assistive device: None Gait Pattern/deviations: Step-through pattern, Decreased stride length Gait velocity: decreased Gait velocity interpretation: >2.62 ft/sec, indicative of community ambulatory   General Gait Details: 2 standing rest breaks. Pt continues to report dizziness with ambulation with noted increased WOB.   Stairs             Wheelchair Mobility    Modified Rankin (Stroke Patients Only)       Balance Overall balance assessment: Needs assistance Sitting-balance support: Feet supported Sitting balance-Leahy Scale: Good     Standing balance support: No upper extremity supported Standing balance-Leahy Scale: Fair Standing balance comment: no AD. Occasionally grabs side rail when dizzy.                            Cognition Arousal/Alertness: Awake/alert Behavior During Therapy: WFL for tasks assessed/performed Overall Cognitive Status: Within Functional Limits for tasks assessed                                          Exercises      General Comments General comments (skin integrity, edema, etc.): HR  up to 141 with ambulation      Pertinent Vitals/Pain Pain Assessment Pain Assessment: Faces Faces Pain Scale: Hurts little more Pain Location: chest incision Pain Descriptors / Indicators: Guarding, Discomfort, Grimacing Pain Intervention(s): Monitored during session, Repositioned, Relaxation, Limited activity within patient's tolerance    Home Living                           Prior Function            PT Goals (current goals can now be found in the care plan section) Progress towards PT goals: Progressing toward goals    Frequency    Min 1X/week      PT Plan Current plan remains appropriate    Co-evaluation              AM-PAC PT "6 Clicks" Mobility   Outcome Measure  Help needed turning from your back to your side while in a flat bed without using bedrails?: A Little Help needed moving from lying on your back to sitting on the side of a flat bed without using bedrails?: A Little Help needed moving to and from a bed to a chair (including a wheelchair)?: A Little Help needed standing up from a chair using your arms (e.g., wheelchair or bedside chair)?: None Help needed to walk in hospital room?: A Little Help needed climbing 3-5 steps with a railing? : A Little 6 Click Score: 19    End of Session Equipment Utilized During Treatment: Gait belt Activity Tolerance: Patient tolerated treatment well Patient left: in chair;with call bell/phone within reach;with family/visitor present Nurse Communication: Mobility status PT Visit Diagnosis: Other abnormalities of gait and mobility (R26.89);Muscle weakness (generalized) (M62.81);Pain;Dizziness and giddiness (R42)     Time: 1610-9604 PT Time Calculation (min) (ACUTE ONLY): 21 min  Charges:  $Gait Training: 8-22 mins                     [name], PT, DPT Acute Rehab Services [phone]    Gladys Damme 11/16/2022, 12:17 PM

## 2022-11-16 NOTE — Progress Notes (Signed)
   11/16/22 2025  BiPAP/CPAP/SIPAP  Reason BIPAP/CPAP not in use Non-compliant;Other(comment)  BiPAP/CPAP Brett Marshall Vitals  Pulse Rate (!) 106  Resp 17  SpO2 100 %  Bilateral Breath Sounds Diminished  MEWS Score/Color  MEWS Score 1  MEWS Score Color Green   Patient refused cpap

## 2022-11-16 NOTE — Progress Notes (Signed)
   11/16/22 1000  Assess: MEWS Score  BP (!) 123/90  MAP (mmHg) 100  Pulse Rate (!) 114  Resp 18  SpO2 98 %  O2 Device Room Air  Assess: MEWS Score  MEWS Temp 0  MEWS Systolic 0  MEWS Pulse 2  MEWS RR 0  MEWS LOC 0  MEWS Score 2  MEWS Score Color Yellow  Assess: if the MEWS score is Yellow or Red  Were vital signs taken at a resting state? Yes  Focused Assessment No change from prior assessment  Does the patient meet 2 or more of the SIRS criteria? No  MEWS guidelines implemented  Yes, yellow  Treat  MEWS Interventions Considered administering scheduled or prn medications/treatments as ordered  Take Vital Signs  Increase Vital Sign Frequency  Yellow: Q2hr x1, continue Q4hrs until patient remains green for 12hrs  Escalate  MEWS: Escalate Yellow: Discuss with charge nurse and consider notifying provider and/or RRT  Notify: Charge Nurse/RN  Name of Charge Nurse/RN Notified Quarry manager  Assess: SIRS CRITERIA  SIRS Temperature  0  SIRS Pulse 1  SIRS Respirations  0  SIRS WBC 0  SIRS Score Sum  1

## 2022-11-17 ENCOUNTER — Inpatient Hospital Stay (HOSPITAL_COMMUNITY): Payer: BLUE CROSS/BLUE SHIELD

## 2022-11-17 DIAGNOSIS — J90 Pleural effusion, not elsewhere classified: Secondary | ICD-10-CM | POA: Diagnosis not present

## 2022-11-17 DIAGNOSIS — R0600 Dyspnea, unspecified: Secondary | ICD-10-CM | POA: Diagnosis not present

## 2022-11-17 DIAGNOSIS — D509 Iron deficiency anemia, unspecified: Secondary | ICD-10-CM | POA: Diagnosis not present

## 2022-11-17 DIAGNOSIS — I5033 Acute on chronic diastolic (congestive) heart failure: Secondary | ICD-10-CM

## 2022-11-17 DIAGNOSIS — G8918 Other acute postprocedural pain: Secondary | ICD-10-CM

## 2022-11-17 DIAGNOSIS — R627 Adult failure to thrive: Secondary | ICD-10-CM | POA: Diagnosis not present

## 2022-11-17 LAB — BASIC METABOLIC PANEL
Anion gap: 14 (ref 5–15)
BUN: 14 mg/dL (ref 6–20)
CO2: 27 mmol/L (ref 22–32)
Calcium: 8.9 mg/dL (ref 8.9–10.3)
Chloride: 94 mmol/L — ABNORMAL LOW (ref 98–111)
Creatinine, Ser: 1.21 mg/dL (ref 0.61–1.24)
GFR, Estimated: >60 mL/min (ref >60)
Glucose, Bld: 200 mg/dL — ABNORMAL HIGH (ref 70–99)
Potassium: 4 mmol/L (ref 3.5–5.1)
Sodium: 135 mmol/L (ref 135–145)

## 2022-11-17 LAB — CBC
HCT: 29.5 % — ABNORMAL LOW (ref 39.0–52.0)
Hemoglobin: 8.9 g/dL — ABNORMAL LOW (ref 13.0–17.0)
MCH: 27 pg (ref 26.0–34.0)
MCHC: 30.2 g/dL (ref 30.0–36.0)
MCV: 89.4 fL (ref 80.0–100.0)
Platelets: 648 10*3/uL — ABNORMAL HIGH (ref 150–400)
RBC: 3.3 MIL/uL — ABNORMAL LOW (ref 4.22–5.81)
RDW: 15.9 % — ABNORMAL HIGH (ref 11.5–15.5)
WBC: 12.7 10*3/uL — ABNORMAL HIGH (ref 4.0–10.5)
nRBC: 0.6 % — ABNORMAL HIGH (ref 0.0–0.2)

## 2022-11-17 LAB — MAGNESIUM: Magnesium: 2.1 mg/dL (ref 1.7–2.4)

## 2022-11-17 LAB — GLUCOSE, CAPILLARY
Glucose-Capillary: 206 mg/dL — ABNORMAL HIGH (ref 70–99)
Glucose-Capillary: 214 mg/dL — ABNORMAL HIGH (ref 70–99)
Glucose-Capillary: 157 mg/dL — ABNORMAL HIGH (ref 70–99)
Glucose-Capillary: 225 mg/dL — ABNORMAL HIGH (ref 70–99)

## 2022-11-17 LAB — T4, FREE: Free T4: 0.66 ng/dL (ref 0.61–1.12)

## 2022-11-17 LAB — BRAIN NATRIURETIC PEPTIDE: B Natriuretic Peptide: 263.5 pg/mL — ABNORMAL HIGH (ref 0.0–100.0)

## 2022-11-17 NOTE — Progress Notes (Signed)
PROGRESS NOTE    Brett Marshall  MVH:[id number] DOB: [birth year] DOA: 11/11/2022 PCP: Ethelda Chick, MD   Brief Narrative:  Patient is a 57 year old morbidly obese Caucasian male with a past medical history significant for but not limited to CAD, diabetes mellitus type 2, CHF, hyperlipidemia, pulm hypertension as well as aortic stenosis status post recent replacement with oral aortic valve replacement on 10/30/2022 by CT surgery.  Operatively he had been having a hard time recovering especially in strength.  He is seen by outpatient cardiothoracic surgery the day before yesterday and given his overall declining condition he was sent to the hospital for further management and assistance and diuretics were held.  There have been concerns of failure to thrive at home.  He had lab work done which showed clinical evidence of dehydration with a mild AKI and a CT of the chest was done and showed postoperative hematoma and bilateral pleural effusion.  CT abdomen pelvis was negative for any acute pathology.  CT surgery was notified and they recommended obtaining echocardiogram which was done and as below..  PT OT recommending home health.    Given that he continues to still be dyspneic pulmonary was consulted for further evaluation and medication changes have been done as below. He is now getting diuresis and still remains profoundly dyspneic.  He is recommending that his wife bring his CPAP mask from home for continuing Lasix.  Assessment and Plan:  Generalized weakness with adult failure to thrive Dyspnea -Postoperatively patient is having difficult time caring for himself at home.   -TSH was 7.559 and so will check Free T4, cortisol level 16.9, vitamin B12 levels 8.02 vitamin B12 level was 212 and folate levels 11.0 -Vitamin D level was low and so this will be supplemented and nutritionist evaluated and recommending multivitamin encouraging good p.o. intake -ECHO as below  -WBC Trend:  Recent Labs   Lab 11/11/22 1525 11/12/22 0501 11/13/22 0309 11/14/22 0030 11/15/22 0020 11/16/22 0356 11/17/22 0132  WBC 15.9* 11.0* 12.1* 10.6* 10.2 11.2* 12.7*  -DG Chest done and as below -Check LE Duplex and showing no DVT -PT/OT recommending Home Health  -Nutrition consult. -See below for his dyspnea   Acute Kidney Injury, prerenal Moderate Dehydration, had improved -Baseline creatinine 1.0, admission creatinine 1.54 and now worsened.   --BUN/Cr Trend: Recent Labs  Lab 11/09/22 1015 11/11/22 1525 11/12/22 0501 11/13/22 0309 11/14/22 0030 11/15/22 0020 11/16/22 0356 11/17/22 0132  BUN 21* 15  --  12 14 11 12 14   CREATININE 1.41* 1.54* 1.48* 1.49* 1.67* 1.38* 1.23 1.21  -IVF hydration stopped and he is now getting diuresed -UA done and showed clear appearance with negative bilirubin, greater than 500 glucose, negative hemoglobin, 5 ketones, negative leukocytes, negative nitrites, negative protein, no bacteria seen, 0-5 RBCs per high-power field and 0-5 WBCs but urine osmolality is 654 and urine sodium 26 and urine creatinine 105 -FENa Calculated and was 0.3 and Pre-Renal -Avoid Nephrotoxic Medications, Contrast Dyes, Hypotension and Dehydration to Ensure Adequate Renal Perfusion and will need to Renally Adjust Meds -Continue to Monitor and Trend Renal Function carefully and repeat CMP in the AM  -If worsening will consult Nephrology in the AM but Renal Fxn improving with Diuresis    History of CAD status post PCI in 2023 Bicuspid aortic valve status post surgical replacement on October 30, 2022 Chest Discomfort -CT surgery saw the patient on the day of admission and sent him to the ER for further evaluation. -Dr. Nelson Chimes Notified  Dr Leafy Ro about the admission.  -Continue management with aspirin, statin. Stable hematoma near sternotomy, CT surgery aware of this.   -Had CT scan of the Chest and done and showed "Stable in size and appearance of a 6.4 x 3.7 cm prevascular fluid and gas  collection likely representing a hematoma status post median sternotomy and aortic valve repair. Similar-appearing gas and fluid collection along the anterior aspect of the manubrium and manubrium clavicular joints which appears to possibly be contiguous with the pre-vascular collection. No active extravasation of intravenous contrast or increased density within the collection to suggest hemorrhage. No interval peripheral enhancement to suggest abscess formation; although, infection of the sternotomy and anterior mediastinum is not excluded. No cortical erosion or destruction of the sternum to suggest osteomyelitis. Sternotomy wires are intact. Bilateral small volume pleural effusions.. No acute intra-abdominal or intrapelvic abnormality." -Continue with aspirin 325 mg p.o. daily, atorvastatin 40 mg p.o. daily, metoprolol tartrate 5 mg IV every 4 as needed for heart rate greater than 110 -Troponin ordered and done and Trend shows: -Troponin I went from 59 -> 37 -> 24 -BNP went from 309.6 -> 263.5 -Continue to monitor and cardiothoracic surgery recommending a limited echo and this was done and showed "Limited study to assess EF and AVR. Left ventricular ejection fraction, by estimation, is 65 to 70%. The left ventricle has normal function. The left ventricle has no regional wall motion abnormalities. The aortic valve has been repaired/replaced. Aortic valve regurgitation is not visualized. There is a 23 mm Edwards Inspiris Resilia Bioprosthetic valve present in the aortic position. Echo findings are consistent with normal structure and function of the aortic valve prosthesis. Aortic valve area, by VTI measures 2.07 cm. Aortic valve mean gradient measures 14.0 mHg. Aortic valve Vmax measures 2.35 m/s. DI 0.47."  -Continue Diuresis with 40 mg q12h and will now Fluid Restrict to 1500 mL and change Diet to Heart Healthy/Carb Modified   Chronic Pain -Continue Gabapentin 300 mg po qHS   GERD/GI  Prophylaxis -Continue with PPI   Reactive Airway Disease Dyspnea on Exertion and Significant SOB -His Dulera was changed by pulmonary  to Budesonide 0.25 mg nebs twice daily as well as arformoterol 15 mcg nebs twice daily nebulized and continuing DuoNebs as needed; See above -Now on Yupleri  -Will add Flutter Valve and Incentive Spirometry and Guaifenesin 1200 mg po BID -Will consider obtaining V/Q Scan; Recent CT Scan -Given his persistent dyspnea on exertion pulmonary was consulted for further evaluation recommendations -Pulmonary recommending diuresis and his fluids were stopped yesterday and was given diuresis IV 40 mg now going to IV 40 twice daily -Pulmonary recommends continuing to diurese to try and maintain euvolemia and feels that he is very physically deconditioned after being in the hospital and thinks that he would benefit from outpatient PSG at some point -Pulmonary thinks that his anemia may be playing a role in his shortness of breath but is unclear and recommending possibly transfusing followed at least Lasix to see if this shortness of breath helps. -Repeat CXR yesterday showed "Bibasilar patchy and dense left retrocardiac opacities, likely atelectasis. Aspiration or pneumonia can be considered in the appropriate clinical setting. Similar bilateral pleural effusions. Cardiomegaly." -CXR today done and showed "The cardio pericardial silhouette is enlarged. Left base collapse/consolidation with small effusion is similar to prior. No acute bony abnormality. Telemetry leads overlie the chest." -Patient likely has ATX he still profoundly dyspneic  Sinus Tachycardia -Unclear reasoning but could be related to anemia and deconditioning -  Persistent and will need to continue monitor telemetry -CTA as above -Consider obtaining a V/Q if persistently tachycardic -Pulmonology has been a started the patient on scheduled daily metoprolol after etiologies are ruled out   Insomnia -C/w  Trazodone 50 mg po qHS  Normocytic Anemia -Hgb/Hct Trend: Recent Labs  Lab 11/11/22 1525 11/12/22 0501 11/13/22 0309 11/14/22 0030 11/15/22 0020 11/16/22 0356 11/17/22 0132  HGB 8.7* 9.1* 8.2* 7.9* 7.7* 8.1* 8.9*  HCT 28.7* 30.2* 27.1* 25.9* 25.8* 27.1* 29.5*  MCV 89.4 89.9 90.6 90.2 93.1 92.8 89.4  -Checked Anemia Panel and showed an iron level of 19, UIBC of 260, TIBC of 279, saturation ratios of 7%, ferritin level 69, folate of 8.3, vitamin B12 211 -Started po Niferex Supplement  -Continue to Monitor for S/Sx of Bleeding; No overt bleeding noted -Repeat CBC in the AM  Thrombocytosis, improving  -Patient's Platelet Count: Recent Labs  Lab 11/11/22 1525 11/12/22 0501 11/13/22 0309 11/14/22 0030 11/15/22 0020 11/16/22 0356 11/17/22 0132  PLT 602* 548* 506* 495* 489* 508* 648*  -Continue to Monitor and Trend and repeat CMP in the AM   Diabetes Mellitus Type 2, Insulin-Dependent -CBG and Glucose Trend: Recent Labs  Lab 11/09/22 1015 11/11/22 1525 11/11/22 2255 11/13/22 0309 11/13/22 0732 11/14/22 0030 11/14/22 0732 11/15/22 0020 11/15/22 0900 11/16/22 0356 11/16/22 0859 11/17/22 0132 11/17/22 0753 11/17/22 1654  GLUCOSE 143* 144*  --  155*  --  162*  --  189*  --  204*  --  200*  --   --   GLUCAP  --   --    < >  --    < >  --    < >  --    < >  --    < >  --    < > 157*   < > = values in this interval not displayed.  -C/w Levmir 20 units sq Daily and 0-24 Units TIDwm  Hypoalbuminemia -Patient's Albumin Trend: Recent Labs  Lab 10/28/22 1113 11/14/22 0030 11/15/22 0020 11/16/22 0356  ALBUMIN 3.3* 2.6* 2.5* 2.6*  -Continue to Monitor and Trend and repeat CMP in the AM  Morbid Obesity -Complicates overall prognosis and care -Estimated body mass index is 48.97 kg/m as calculated from the following:   Height as of this encounter: 5\' 8"  (1.727 m).   Weight as of this encounter: 146.1 kg.  -Weight Loss and Dietary Counseling given  DVT prophylaxis:  enoxaparin (LOVENOX) injection 40 mg Start: 11/12/22 1000 SCDs Start: 11/11/22 2116    Code Status: Full Code Family Communication: Discussed with wife at bedside  Disposition Plan:  Level of care: Telemetry Medical Status is: Inpatient Remains inpatient appropriate because: Remains profoundly dyspneic    Consultants:  Pulmonary Cardiothoracic Surgery   Procedures:  As delineated as above   ECHOCARDIOGRAM IMPRESSIONS     1. Limited study to assess EF and AVR.   2. Left ventricular ejection fraction, by estimation, is 65 to 70%. The  left ventricle has normal function. The left ventricle has no regional  wall motion abnormalities.   3. The aortic valve has been repaired/replaced. Aortic valve  regurgitation is not visualized. There is a 23 mm Edwards Inspiris Resilia  Bioprosthetic valve present in the aortic position. Echo findings are  consistent with normal structure and function  of the aortic valve prosthesis. Aortic valve area, by VTI measures 2.07  cm. Aortic valve mean gradient measures 14.0 mmHg. Aortic valve Vmax  measures 2.35 m/s. DI 0.47  FINDINGS   Left Ventricle: Left ventricular ejection fraction, by estimation, is 65  to 70%. The left ventricle has normal function. The left ventricle has no  regional wall motion abnormalities. Definity contrast agent was given IV  to delineate the left ventricular   endocardial borders.   Aortic Valve: The aortic valve has been repaired/replaced. Aortic valve  regurgitation is not visualized. Aortic valve mean gradient measures 14.0  mmHg. Aortic valve peak gradient measures 22.0 mmHg. Aortic valve area, by  VTI measures 2.07 cm. There is a   23 mm Edwards Inspiris Resilia Bioprosthetic valve present in the aortic  position. Echo findings are consistent with normal structure and function  of the aortic valve prosthesis.   Additional Comments: Spectral Doppler performed. Color Doppler performed.    LEFT VENTRICLE   PLAX 2D  LVOT diam:     2.10 cm  LV SV:         74  LV SV Index:   29  LVOT Area:     3.46 cm     AORTIC VALVE  AV Area (Vmax):    1.67 cm  AV Area (Vmean):   1.86 cm  AV Area (VTI):     2.07 cm  AV Vmax:           234.50 cm/s  AV Vmean:          176.500 cm/s  AV VTI:            0.357 m  AV Peak Grad:      22.0 mmHg  AV Mean Grad:      14.0 mmHg  LVOT Vmax:         113.00 cm/s  LVOT Vmean:        94.900 cm/s  LVOT VTI:          0.213 m  LVOT/AV VTI ratio: 0.60     SHUNTS  Systemic VTI:  0.21 m  Systemic Diam: 2.10 cm   LE VENOUS DUPLEX Summary:  BILATERAL:  -No evidence of popliteal cyst, bilaterally.  RIGHT:  - There is no evidence of deep vein thrombosis in the lower extremity.  However, portions of this examination were limited- see technologist  comments above.    LEFT:  - There is no evidence of deep vein thrombosis in the lower extremity.  However, portions of this examination were limited- see technologist  comments above.    Antimicrobials:  Anti-infectives (From admission, onward)    None       Subjective: Seen and examined at bedside and remains profoundly dyspneic and complaining of some right-sided chest discomfort.  Continues to be extremely dyspneic on examination.  No nausea or vomiting.  Leg swelling is improving slightly.  No other concerns or complaints this time.  Objective: Vitals:   11/17/22 0817 11/17/22 1502 11/17/22 1515 11/17/22 1653  BP:    117/84  Pulse: (!) 110 (!) 124 (!) 115 (!) 113  Resp: 18   17  Temp:    97.9 F (36.6 C)  TempSrc:    Oral  SpO2:    100%  Weight:      Height:        Intake/Output Summary (Last 24 hours) at 11/17/2022 1854 Last data filed at 11/17/2022 0244 Gross per 24 hour  Intake 480 ml  Output --  Net 480 ml   Filed Weights   11/11/22 1511 11/11/22 2258 11/17/22 0500  Weight: (!) 144 kg (!) 146.1 kg (!) 146.1 kg  Examination: Physical Exam:  Constitutional: WN/WD Morbidly Obese  Caucasian male in NAD Respiratory: Diminished to auscultation bilaterally with coarse breath sounds and has some rhonchi and crackles; no wheezing, rales. Normal respiratory effort and patient is not tachypenic. No accessory muscle use.  Wearing supplemental oxygen nasal cannula Cardiovascular: RRR, no murmurs / rubs / gallops. S1 and S2 auscultated.  1+ lower extremity edema Abdomen: Soft, non-tender, distended secondary to body habitus. Bowel sounds positive.  GU: Deferred. Musculoskeletal: No clubbing / cyanosis of digits/nails. No joint deformity upper and lower extremities. Skin: No rashes, lesions, ulcers limited skin evaluation but mid sternal scar noted Neurologic: CN 2-12 grossly intact with no focal deficits. Romberg sign and cerebellar reflexes not assessed.  Psychiatric: Normal judgment and insight. Alert and oriented x 3. Normal mood and appropriate affect.   Data Reviewed: I have personally reviewed following labs and imaging studies  CBC: Recent Labs  Lab 11/13/22 0309 11/14/22 0030 11/15/22 0020 11/16/22 0356 11/17/22 0132  WBC 12.1* 10.6* 10.2 11.2* 12.7*  NEUTROABS  --  7.7 7.4 8.0*  --   HGB 8.2* 7.9* 7.7* 8.1* 8.9*  HCT 27.1* 25.9* 25.8* 27.1* 29.5*  MCV 90.6 90.2 93.1 92.8 89.4  PLT 506* 495* 489* 508* 648*   Basic Metabolic Panel: Recent Labs  Lab 11/13/22 0309 11/14/22 0030 11/15/22 0020 11/16/22 0356 11/17/22 0132  NA 136 137 139 133* 135  K 4.1 4.1 4.6 4.5 4.0  CL 99 99 102 99 94*  CO2 24 29 28 26 27   GLUCOSE 155* 162* 189* 204* 200*  BUN 12 14 11 12 14   CREATININE 1.49* 1.67* 1.38* 1.23 1.21  CALCIUM 8.6* 8.7* 8.7* 8.5* 8.9  MG 1.8 1.9 2.0 1.9 2.1  PHOS  --  3.8 3.3 3.1  --    GFR: Estimated Creatinine Clearance: 94.8 mL/min (by C-G formula based on SCr of 1.21 mg/dL). Liver Function Tests: Recent Labs  Lab 11/14/22 0030 11/15/22 0020 11/16/22 0356  AST 18 16 17   ALT 14 14 13   ALKPHOS 104 99 98  BILITOT 0.7 0.7 0.6  PROT 6.7 6.5 6.9   ALBUMIN 2.6* 2.5* 2.6*   No results for input(s): "LIPASE", "AMYLASE" in the last 168 hours. No results for input(s): "AMMONIA" in the last 168 hours. Coagulation Profile: No results for input(s): "INR", "PROTIME" in the last 168 hours. Cardiac Enzymes: No results for input(s): "CKTOTAL", "CKMB", "CKMBINDEX", "TROPONINI" in the last 168 hours. BNP (last 3 results) No results for input(s): "PROBNP" in the last 8760 hours. HbA1C: No results for input(s): "HGBA1C" in the last 72 hours. CBG: Recent Labs  Lab 11/16/22 1714 11/16/22 2046 11/17/22 0753 11/17/22 1124 11/17/22 1654  GLUCAP 169* 173* 206* 214* 157*   Lipid Profile: No results for input(s): "CHOL", "HDL", "LDLCALC", "TRIG", "CHOLHDL", "LDLDIRECT" in the last 72 hours. Thyroid Function Tests: Recent Labs    11/17/22 0132  FREET4 0.66   Anemia Panel: Recent Labs    11/16/22 0356  VITAMINB12 211  FOLATE 8.3  FERRITIN 69  TIBC 279  IRON 19*  RETICCTPCT 5.5*   Sepsis Labs: No results for input(s): "PROCALCITON", "LATICACIDVEN" in the last 168 hours.  No results found for this or any previous visit (from the past 240 hour(s)).   Radiology Studies: DG CHEST PORT 1 VIEW  Result Date: 11/17/2022 CLINICAL DATA:  Shortness of breath. EXAM: PORTABLE CHEST 1 VIEW COMPARISON:  11/16/2022 FINDINGS: The cardio pericardial silhouette is enlarged. Left base collapse/consolidation with small effusion is similar to  prior. No acute bony abnormality. Telemetry leads overlie the chest. IMPRESSION: Persistent left base collapse/consolidation with small effusion. Electronically Signed   By: Kennith Center M.D.   On: 11/17/2022 13:27   VAS [place] LOWER EXTREMITY VENOUS (DVT)  Result Date: 11/17/2022  Lower Venous DVT Study Patient Name:  ROXAS SIGERS [facility]  Date of Exam:   11/16/2022 Medical Rec #: [id number]        Accession #:    [id number] Date of Birth: Sep 07, 1965        Patient Gender: M Patient Age:   50 years Exam Location:  [facility] Procedure:      VAS [place] LOWER EXTREMITY VENOUS (DVT) Referring Phys: Marguerita Merles --------------------------------------------------------------------------------  Indications: Swelling, and SOB.  Limitations: Body habitus (BMI and pain with compression. Comparison Study: Prior negative LEV done 09/07/22 Performing Technologist: Sherren Kerns RVS  Examination Guidelines: A complete evaluation includes B-mode imaging, spectral Doppler, color Doppler, and power Doppler as needed of all accessible portions of each vessel. Bilateral testing is considered an integral part of a complete examination. Limited examinations for reoccurring indications may be performed as noted. The reflux portion of the exam is performed with the patient in reverse Trendelenburg.  +---------+---------------+---------+-----------+----------+-------------------+ RIGHT    CompressibilityPhasicitySpontaneityPropertiesThrombus Aging      +---------+---------------+---------+-----------+----------+-------------------+ CFV      Full           Yes      Yes                                      +---------+---------------+---------+-----------+----------+-------------------+ SFJ      Full                                                             +---------+---------------+---------+-----------+----------+-------------------+ FV Prox  Full                                                             +---------+---------------+---------+-----------+----------+-------------------+ FV Mid                  Yes      Yes                  patent by color and                                                       Doppler             +---------+---------------+---------+-----------+----------+-------------------+ FV Distal               Yes      Yes                  patent by color and  Doppler              +---------+---------------+---------+-----------+----------+-------------------+ PFV      Full                                                             +---------+---------------+---------+-----------+----------+-------------------+ POP      Full                                                             +---------+---------------+---------+-----------+----------+-------------------+ PTV                                                   Not well visualized +---------+---------------+---------+-----------+----------+-------------------+ PERO                                                  Not well visualized +---------+---------------+---------+-----------+----------+-------------------+   +---------+---------------+---------+-----------+----------+-------------------+ LEFT     CompressibilityPhasicitySpontaneityPropertiesThrombus Aging      +---------+---------------+---------+-----------+----------+-------------------+ CFV      Full           Yes      Yes                                      +---------+---------------+---------+-----------+----------+-------------------+ SFJ      Full                                                             +---------+---------------+---------+-----------+----------+-------------------+ FV Prox  Full                                                             +---------+---------------+---------+-----------+----------+-------------------+ FV Mid   Full                                                             +---------+---------------+---------+-----------+----------+-------------------+ FV DistalFull                                                             +---------+---------------+---------+-----------+----------+-------------------+  PFV      Full                                                             +---------+---------------+---------+-----------+----------+-------------------+  POP      Full           Yes      Yes                                      +---------+---------------+---------+-----------+----------+-------------------+ PTV                                                   patent by color     +---------+---------------+---------+-----------+----------+-------------------+ PERO                                                  Not well visualized +---------+---------------+---------+-----------+----------+-------------------+     Summary: BILATERAL: -No evidence of popliteal cyst, bilaterally. RIGHT: - There is no evidence of deep vein thrombosis in the lower extremity. However, portions of this examination were limited- see technologist comments above.  LEFT: - There is no evidence of deep vein thrombosis in the lower extremity. However, portions of this examination were limited- see technologist comments above.  *See table(s) above for measurements and observations. Electronically signed by Waverly Ferrari MD on 11/17/2022 at 8:32:39 AM.    Final    DG CHEST PORT 1 VIEW  Result Date: 11/16/2022 CLINICAL DATA:  Shortness of breath. Status post aortic valve replacement 10/30/2022 EXAM: PORTABLE CHEST 1 VIEW COMPARISON:  Chest radiograph dated 11/15/2022 FINDINGS: Lines/tubes: None. Lungs: Bibasilar patchy and dense left retrocardiac opacities. Pleura: Similar small left pleural effusion and right apicolateral pleural thickening. No pneumothorax. Heart/mediastinum: Enlarged cardiomediastinal silhouette. Bones: Median sternotomy wires are nondisplaced. IMPRESSION: 1. Bibasilar patchy and dense left retrocardiac opacities, likely atelectasis. Aspiration or pneumonia can be considered in the appropriate clinical setting. 2. Similar bilateral pleural effusions. 3. Cardiomegaly. Electronically Signed   By: Agustin Cree M.D.   On: 11/16/2022 08:38    Scheduled Meds:  arformoterol  15 mcg Nebulization BID   aspirin EC  325 mg Oral Daily   atorvastatin  40 mg  Oral q1800   budesonide (PULMICORT) nebulizer solution  0.25 mg Nebulization BID   cholecalciferol  1,000 Units Oral Daily   DULoxetine  120 mg Oral QHS   enoxaparin (LOVENOX) injection  40 mg Subcutaneous Q24H   famotidine  40 mg Oral QHS   furosemide  40 mg Intravenous Q12H   gabapentin  300 mg Oral QHS   insulin aspart  0-24 Units Subcutaneous TID WC   insulin detemir  20 Units Subcutaneous Daily   iron polysaccharides  150 mg Oral Daily   multivitamin with minerals  1 tablet Oral Daily   traZODone  50 mg Oral QHS   Continuous Infusions:   LOS: 3 days   Marguerita Merles, DO [facility] Available via Epic secure chat  7am-7pm After these hours, please refer to coverage provider listed on [email or web address] 11/17/2022, 6:54 PM

## 2022-11-17 NOTE — Progress Notes (Signed)
NAME:  Brett Marshall, MRN:  [id number], DOB:  1966-04-03, LOS: 3 ADMISSION DATE:  11/11/2022, CONSULTATION DATE: 11/15/2022 REFERRING MD: Dr. Marland Mcalpine, CHIEF COMPLAINT: Noninvasive ventilation  History of Present Illness:  This is a 57 year old gentleman, past medical history of severe AAS, coronary artery disease, diastolic heart failure, OSA, type 2 diabetes presented for a planned AVR in June 2024.  Postop was admitted to the intensive care unit was cared for by IR team.  Has asthma chronic diastolic heart failure and mild pulmonary hypertension.Was discharged from the hospital on 11/02/2022.  Since his hospital discharge from his surgery he has been seen in the emergency department 2 times and admitted to the hospital.  This admission was for chest pain had a prior heart catheterization coronary disease, valve replacement doing well uncomplicated course.  However just has ongoing failure to thrive symptoms with weakness and shortness of breath.  He was found to have mild AKI.  Pulmonary was consulted  Pertinent  Medical History   Past Medical History:  Diagnosis Date   Asthma    CAD (coronary artery disease)    a. 08/2017 Cath: Diff nonobs dzs, EF 45-50%, diff HK; b. 08/2021 MV: Ant/apical ishemia; c. 08/2021 PCI: LAD 95ost (4.0x15 Onyx Frontier DES), EF 55-65%; c. 09/2021 Cath: LM nl, LAD patent stent, 15m, 30d, RI min irregs, LCX min irregs, RCA 50p. LVOT grad 50-17mmHg (rest), 70-179mmHg (provoked).   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 08/2017 LV gram: EF 45-50%, glob HK; b. 08/2021 Echo: EF 60-65%, no rwma, GrI DD, nl RV fxn; c. 09/2021 cMRI: EF 60%, no LVOT obstruction/gradient. Mild conc LVH, mild AS. No myocardial scar/fibrosis. No evidence of HCM.    Hyperlipidemia    Leg swelling    Left   Low back pain    Mixed ICM & NICM    a. 08/2017 LV gram: EF 45-50%, glob HK; b. 08/2021 Echo: EF 60-65%.   Morbid obesity (HCC)    OSA on CPAP    non compliant   Pulmonary hypertension (HCC)      Significant Hospital Events: Including procedures, antibiotic start and stop dates in addition to other pertinent events     Interim History / Subjective:  No acute distress at rest  Objective   Blood pressure 128/83, pulse (!) 110, temperature 97.7 F (36.5 C), temperature source Oral, resp. rate 18, height 5\' 8"  (1.727 m), weight (!) 146.1 kg, SpO2 98 %.    FiO2 (%):  [28 %] 28 %   Intake/Output Summary (Last 24 hours) at 11/17/2022 0956 Last data filed at 11/17/2022 0244 Gross per 24 hour  Intake 480 ml  Output --  Net 480 ml   Filed Weights   11/11/22 1511 11/11/22 2258 11/17/22 0500  Weight: (!) 144 kg (!) 146.1 kg (!) 146.1 kg    Examination: General morbidly obese male sitting up in chair 2 L nasal cannula sats 98% HEENT no JVD or lymphadenopathy is appreciated Pulmonary decreased air movement complains of midsternal back pain with deep inspirations Cardiac heart sounds are regular sinus tachycardia 06/01/1999 19 Abdomen is obese soft nontender Lower extremities with edema No accurate INO   Resolved Hospital Problem list  Assessment & Plan:   Shortness of breath, dyspnea on exertion- Multifactorial etiology. Acutely anemia and hypervolemia with bilateral pleural effusions are contributing most with contribution from deconditioning. He seems to have a component of bronchospasm if he improves with nebs.  Restriction from cardiomegaly and obesity BMI 48, recent chest surgery with loss of chest wall compliance, OSA likely contribute chronically.   Currently on 2 L nasal cannula sats 98% No accurate intake and output but he is getting diuresis He is not using the hospital provided CPAP but his wife is bringing his bask did not  utilize from home. His morbid obesity is contributing To his recent thoracic surgery creates pain with deep inspiration Has trouble doing his pulmonary toilet exercises Continue Brovana and Yupelri are at discharge she can use LAMA/LABA inhaler As needed neuro DuoNebs Continue PT and OT  Sinus tachycardia-- unsure if this is anemia; worry about a very small PE that is too small to detect on CT scan that could be contributing as well, although CT scans are good at detecting most PEs.  Hemoglobin remains stable Continues have sinus tachycardia with 1 12-1 19 Continue to monitor Lower extremity Doppler study was negative   IDA Iron supplementation      Labs   CBC: Recent Labs  Lab 11/13/22 0309 11/14/22 0030 11/15/22 0020 11/16/22 0356 11/17/22 0132  WBC 12.1* 10.6* 10.2 11.2* 12.7*  NEUTROABS  --  7.7 7.4 8.0*  --   HGB 8.2* 7.9* 7.7* 8.1* 8.9*  HCT 27.1* 25.9* 25.8* 27.1* 29.5*  MCV 90.6 90.2 93.1 92.8 89.4  PLT 506* 495* 489* 508* 648*    Basic Metabolic Panel: Recent Labs  Lab 11/13/22 0309 11/14/22 0030 11/15/22 0020 11/16/22 0356 11/17/22 0132  NA 136 137 139 133* 135  K 4.1 4.1 4.6 4.5 4.0  CL 99 99 102 99 94*  CO2 24 29 28 26 27   GLUCOSE 155* 162* 189* 204* 200*  BUN 12 14 11 12 14   CREATININE 1.49* 1.67* 1.38* 1.23 1.21  CALCIUM 8.6* 8.7* 8.7* 8.5* 8.9  MG 1.8 1.9 2.0 1.9 2.1  PHOS  --  3.8 3.3 3.1  --    GFR: Estimated Creatinine Clearance: 94.8 mL/min (by C-G formula based on SCr of 1.21 mg/dL). Recent Labs  Lab 11/14/22 0030 11/15/22 0020 11/16/22 0356 11/17/22 0132  WBC 10.6* 10.2 11.2* 12.7Brett Canales Ziare Orrick ACNP Acute Care Nurse Practitioner [facility] Pulmonary/Critical Care Please consult Amion 11/17/2022, 9:56 AM

## 2022-11-17 NOTE — Progress Notes (Signed)
Physical Therapy Treatment Patient Details Name: Brett Marshall MRN: [id number] DOB: 11/24/1965 Today's Date: 11/17/2022   History of Present Illness Pt is 57 year old presents to [facility] on [facility]/26 with failure to thrive in post operative setting at home after office visit on 6/26.  He reported a fall on 6/24 and was seen in ED at [facility] but sent home. Patient denies hitting head but reports pain near sternal incision (AVR 10/30/22). Patient's spouse stated that he had fallen on his buttocks but now is complaining of difficulty breathing and only taking shallow breaths. PMH - Recent admission on 10/30/22 for AVR, CAD, Aortic stenosis, DM, cardiomyopathy, morbid obesity, pulmonary HTN, depression    PT Comments  Pt tolerated treatment well today. Pt with similar presentation to previous session. No change in DC/DME recs at this time. PT will continue to follow.    Assistance Recommended at Discharge Intermittent Supervision/Assistance  If plan is discharge home, recommend the following:  Can travel by private vehicle    A little help with walking and/or transfers;A lot of help with bathing/dressing/bathroom;Assistance with cooking/housework;Assist for transportation;Help with stairs or ramp for entrance      Equipment Recommendations  None recommended by PT    Recommendations for Other Services       Precautions / Restrictions Precautions Precautions: Fall;Sternal;Other (comment) Precaution Booklet Issued: No Precaution Comments: watch BP, pt also reports some hx vertigo Restrictions Weight Bearing Restrictions: Yes RUE Weight Bearing: Touch down weight bearing LUE Weight Bearing: Touch down weight bearing Other Position/Activity Restrictions: sternal prec     Mobility  Bed Mobility               General bed mobility comments: up in chair upon arrival    Transfers Overall transfer level: Independent Equipment used: None Transfers: Sit to/from Stand Sit to Stand:  Independent           General transfer comment: Maintained sternal precautions    Ambulation/Gait Ambulation/Gait assistance: Supervision Gait Distance (Feet): 250 Feet Assistive device: None Gait Pattern/deviations: Step-through pattern, Decreased stride length Gait velocity: decreased     General Gait Details: 2 standing rest breaks however able to ambulate further today compared to yesterday before needing to rest. Pt continues to report dizziness with ambulation with noted increased WOB.   Stairs             Wheelchair Mobility     Tilt Bed    Modified Rankin (Stroke Patients Only)       Balance Overall balance assessment: Needs assistance Sitting-balance support: Feet supported Sitting balance-Leahy Scale: Good     Standing balance support: No upper extremity supported Standing balance-Leahy Scale: Fair Standing balance comment: no AD. Occasionally grabs side rail when dizzy.                            Cognition Arousal/Alertness: Awake/alert Behavior During Therapy: WFL for tasks assessed/performed Overall Cognitive Status: Within Functional Limits for tasks assessed                                          Exercises      General Comments General comments (skin integrity, edema, etc.): 100% on RA and HR up to 130 with ambulation      Pertinent Vitals/Pain Pain Assessment Pain Assessment: Faces Faces Pain Scale: Hurts a little bit  Pain Location: chest incision Pain Descriptors / Indicators: Guarding, Discomfort, Grimacing Pain Intervention(s): Monitored during session, Repositioned, Relaxation    Home Living                          Prior Function            PT Goals (current goals can now be found in the care plan section) Progress towards PT goals: Progressing toward goals    Frequency    Min 1X/week      PT Plan Current plan remains appropriate    Co-evaluation               AM-PAC PT "6 Clicks" Mobility   Outcome Measure  Help needed turning from your back to your side while in a flat bed without using bedrails?: A Little Help needed moving from lying on your back to sitting on the side of a flat bed without using bedrails?: A Little Help needed moving to and from a bed to a chair (including a wheelchair)?: A Little Help needed standing up from a chair using your arms (e.g., wheelchair or bedside chair)?: None Help needed to walk in hospital room?: A Little Help needed climbing 3-5 steps with a railing? : A Little 6 Click Score: 19    End of Session   Activity Tolerance: Patient tolerated treatment well Patient left: in chair;with call bell/phone within reach;with family/visitor present Nurse Communication: Mobility status PT Visit Diagnosis: Other abnormalities of gait and mobility (R26.89);Muscle weakness (generalized) (M62.81);Pain;Dizziness and giddiness (R42)     Time: 1610-9604 PT Time Calculation (min) (ACUTE ONLY): 14 min  Charges:    $Gait Training: 8-22 mins PT General Charges $$ ACUTE PT VISIT: 1 Visit                     [name], PT, DPT Acute Rehab Services [phone]    Gladys Damme 11/17/2022, 2:55 PM

## 2022-11-17 NOTE — Progress Notes (Signed)
   11/17/22 1704  Assess: if the MEWS score is Yellow or Red  Were vital signs taken at a resting state? Yes  Focused Assessment No change from prior assessment  Does the patient meet 2 or more of the SIRS criteria? No  MEWS guidelines implemented  Yes, yellow  Treat  MEWS Interventions Considered administering scheduled or prn medications/treatments as ordered  Take Vital Signs  Increase Vital Sign Frequency  Yellow: Q2hr x1, continue Q4hrs until patient remains green for 12hrs  Escalate  MEWS: Escalate Yellow: Discuss with charge nurse and consider notifying provider and/or RRT  Notify: Charge Nurse/RN  Name of Charge Nurse/RN Notified [place], Charity fundraiser

## 2022-11-17 NOTE — Inpatient Diabetes Management (Signed)
Inpatient Diabetes Program Recommendations  AACE/ADA: New Consensus Statement on Inpatient Glycemic Control (2015)  Target Ranges:  Prepandial:   less than 140 mg/dL      Peak postprandial:   less than 180 mg/dL (1-2 hours)      Critically ill patients:  140 - 180 mg/dL   Lab Results  Component Value Date   GLUCAP 214 (H) 11/17/2022   HGBA1C 10.0 (H) 10/28/2022    Review of Glycemic Control  Latest Reference Range & Units 11/16/22 08:59 11/16/22 13:08 11/16/22 17:14 11/16/22 20:46 11/17/22 07:53 11/17/22 11:24  Glucose-Capillary 70 - 99 mg/dL 098 (H) 119 (H) 147 (H) 173 (H) 206 (H) 214 (H)  (H): Data is abnormally high  Diabetes history: DM2 Outpatient Diabetes medications: Lantus 30 units every day, Jardiance 25 mg every day, Metformin 1000 mg BID,  Current orders for Inpatient glycemic control: Levemir 20 units every day, Novolog 0-24 units TID  Inpatient Diabetes Program Recommendations:    If glucose remains >180 mg/dL, please consider:  Levemir 22 units every day.  Will continue to follow while inpatient.  Thank you, Dulce Sellar, MSN, CDCES Diabetes Coordinator Inpatient Diabetes Program [phone] (team pager from 8a-5p)

## 2022-11-17 NOTE — Consult Note (Addendum)
WOC Nurse Consult Note: Reason for Consult: Consult requested for buttocks and skin folds. Pt states he has a chronic problem with red moist macerated skin to abd skin folds, inner groin, underarms, buttocks.  He has been using antifungal powder prior to admission. All areas are red, moist and macerated with patchy areas of partial thickness skin loss.  Left lower posterior buttock with partial thickness skin loss, 50% red, 50% yellow, related to moisture; affected area approx 4X4X.1cm, mod amt yellow drainage. This is NOT a pressure injury. ICD-10 CM Codes for Irritant Dermatitis L24A0 - Due to friction or contact with body fluids; unspecified Dressing procedure/placement/frequency: Topical treatment orders provided for bedside nurses to perform as follows:  1. Foam dressing to left buttock, change Q 3 days or PRN soiling 2. Antifungal powder to skin folds BID and PRN when cleaning. Please re-consult if further assistance is needed.  Thank-you,  Cammie Mcgee MSN, RN, CWOCN, [place], CNS [phone]

## 2022-11-18 ENCOUNTER — Inpatient Hospital Stay (HOSPITAL_COMMUNITY): Payer: BLUE CROSS/BLUE SHIELD

## 2022-11-18 ENCOUNTER — Ambulatory Visit: Payer: BLUE CROSS/BLUE SHIELD

## 2022-11-18 DIAGNOSIS — R627 Adult failure to thrive: Secondary | ICD-10-CM | POA: Diagnosis not present

## 2022-11-18 LAB — CBC WITH DIFFERENTIAL/PLATELET
Abs Immature Granulocytes: 0.03 10*3/uL (ref 0.00–0.07)
Basophils Absolute: 0.1 10*3/uL (ref 0.0–0.1)
Basophils Relative: 1 %
Eosinophils Absolute: 0.3 10*3/uL (ref 0.0–0.5)
Eosinophils Relative: 4 %
HCT: 26.4 % — ABNORMAL LOW (ref 39.0–52.0)
Hemoglobin: 7.9 g/dL — ABNORMAL LOW (ref 13.0–17.0)
Immature Granulocytes: 0 %
Lymphocytes Relative: 19 %
Lymphs Abs: 1.6 10*3/uL (ref 0.7–4.0)
MCH: 26.2 pg (ref 26.0–34.0)
MCHC: 29.9 g/dL — ABNORMAL LOW (ref 30.0–36.0)
MCV: 87.4 fL (ref 80.0–100.0)
Monocytes Absolute: 0.7 10*3/uL (ref 0.1–1.0)
Monocytes Relative: 8 %
Neutro Abs: 5.7 10*3/uL (ref 1.7–7.7)
Neutrophils Relative %: 68 %
Platelets: 504 10*3/uL — ABNORMAL HIGH (ref 150–400)
RBC: 3.02 MIL/uL — ABNORMAL LOW (ref 4.22–5.81)
RDW: 15.8 % — ABNORMAL HIGH (ref 11.5–15.5)
WBC: 8.4 10*3/uL (ref 4.0–10.5)
nRBC: 0.4 % — ABNORMAL HIGH (ref 0.0–0.2)

## 2022-11-18 LAB — GLUCOSE, CAPILLARY
Glucose-Capillary: 200 mg/dL — ABNORMAL HIGH (ref 70–99)
Glucose-Capillary: 223 mg/dL — ABNORMAL HIGH (ref 70–99)
Glucose-Capillary: 103 mg/dL — ABNORMAL HIGH (ref 70–99)
Glucose-Capillary: 162 mg/dL — ABNORMAL HIGH (ref 70–99)
Glucose-Capillary: 244 mg/dL — ABNORMAL HIGH (ref 70–99)

## 2022-11-18 LAB — COMPREHENSIVE METABOLIC PANEL
ALT: 18 U/L (ref 0–44)
AST: 25 U/L (ref 15–41)
Albumin: 2.5 g/dL — ABNORMAL LOW (ref 3.5–5.0)
Alkaline Phosphatase: 93 U/L (ref 38–126)
Anion gap: 9 (ref 5–15)
BUN: 15 mg/dL (ref 6–20)
CO2: 30 mmol/L (ref 22–32)
Calcium: 8.4 mg/dL — ABNORMAL LOW (ref 8.9–10.3)
Chloride: 96 mmol/L — ABNORMAL LOW (ref 98–111)
Creatinine, Ser: 1.15 mg/dL (ref 0.61–1.24)
GFR, Estimated: >60 mL/min (ref >60)
Glucose, Bld: 200 mg/dL — ABNORMAL HIGH (ref 70–99)
Potassium: 3.9 mmol/L (ref 3.5–5.1)
Sodium: 135 mmol/L (ref 135–145)
Total Bilirubin: 0.6 mg/dL (ref 0.3–1.2)
Total Protein: 6.8 g/dL (ref 6.5–8.1)

## 2022-11-18 LAB — BRAIN NATRIURETIC PEPTIDE: B Natriuretic Peptide: 210.8 pg/mL — ABNORMAL HIGH (ref 0.0–100.0)

## 2022-11-18 LAB — PHOSPHORUS: Phosphorus: 3.5 mg/dL (ref 2.5–4.6)

## 2022-11-18 LAB — MAGNESIUM: Magnesium: 2 mg/dL (ref 1.7–2.4)

## 2022-11-18 LAB — T3: T3, Total: 69 ng/dL — ABNORMAL LOW (ref 71–180)

## 2022-11-18 MED ORDER — METOPROLOL TARTRATE 25 MG PO TABS
25.0000 mg | ORAL_TABLET | Freq: Two times a day (BID) | ORAL | Status: DC
  Administered 2022-11-18 – 2022-11-19 (×2): 25 mg via ORAL
  Filled 2022-11-18 (×2): qty 1

## 2022-11-18 MED ORDER — CYANOCOBALAMIN 1000 MCG/ML IJ SOLN
1000.0000 ug | Freq: Every day | INTRAMUSCULAR | Status: AC
  Administered 2022-11-18 – 2022-11-22 (×5): 1000 ug via SUBCUTANEOUS
  Filled 2022-11-18 (×6): qty 1

## 2022-11-18 MED ORDER — INSULIN ASPART 100 UNIT/ML IJ SOLN
0.0000 [IU] | Freq: Every day | INTRAMUSCULAR | Status: DC
  Administered 2022-11-18: 2 [IU] via SUBCUTANEOUS
  Administered 2022-11-20: 3 [IU] via SUBCUTANEOUS

## 2022-11-18 MED ORDER — INSULIN ASPART 100 UNIT/ML IJ SOLN
0.0000 [IU] | Freq: Three times a day (TID) | INTRAMUSCULAR | Status: DC
  Administered 2022-11-19: 7 [IU] via SUBCUTANEOUS
  Administered 2022-11-19 – 2022-11-20 (×2): 3 [IU] via SUBCUTANEOUS
  Administered 2022-11-20: 4 [IU] via SUBCUTANEOUS
  Administered 2022-11-20: 7 [IU] via SUBCUTANEOUS
  Administered 2022-11-21: 4 [IU] via SUBCUTANEOUS
  Administered 2022-11-21: 7 [IU] via SUBCUTANEOUS
  Administered 2022-11-21: 4 [IU] via SUBCUTANEOUS
  Administered 2022-11-22: 11 [IU] via SUBCUTANEOUS

## 2022-11-18 MED ORDER — ACETAMINOPHEN 325 MG PO TABS
650.0000 mg | ORAL_TABLET | Freq: Four times a day (QID) | ORAL | Status: DC | PRN
  Administered 2022-11-19 – 2022-11-21 (×4): 650 mg via ORAL
  Filled 2022-11-18 (×4): qty 2

## 2022-11-18 NOTE — Progress Notes (Signed)
Brett Marshall  ZOX:[id number] DOB: 08-25-1965 DOA: 11/11/2022 PCP: Ethelda Chick, MD    Brief Narrative:  57 year old with a history of morbid obesity, CAD, DM 2, CHF, HLD, and pulmonary hypertension who underwent an aortic valve replacement 10/30/2022 for treatment of aortic stenosis.  Though his procedure went well he has had difficulty recovering at home postoperatively with progressive decline in his strength and functional abilities with significant persisting dyspnea.  Goals of Care:  Code Status: Full Code   DVT prophylaxis: Lovenox  Interim Hx: Afebrile.  Tachycardic with heart rate 107-115 with sinus rhythm.  Blood pressure stable.  Oxygen saturation stable.  States he feels about the same.  Continues to feel extremely weak and becomes very winded even with just attempted to get to the bathroom.  No chest pain.  Assessment & Plan:  Persistent dyspnea -generalized weakness Workup underway -no glaring single etiology elucidated thus far  Reactive airway disease Care per pulmonary  Sinus tachycardia TSH elevated at 7.6 but T3 and free T4 within normal limits - no evidence of PE on CTa at admission -cortisol level normal 16 days ago -recheck cortisol in a.m. - no DVT on B LE venous duplex   B12 deficiency B12 below clinical goal at 212 -supplement -folic acid level was normal  Iron deficiency Iron studies suggest low iron as well as low TIBC likely related to poor nutritional status -ferritin 69 therefore not severely depressed  Vitamin D deficiency Vitamin D markedly low at 8.02 -initiate supplementation  Acute kidney injury Creatinine has been slowly improving  CAD status post PCI 2023 No symptoms to suggest acute coronary syndrome or anginal equivalent  Bicuspid aortic valve/aortic stenosis status post surgical replacement 10/30/2022 Valve appears to be functioning normally on TTE  Normocytic anemia Monitor hemoglobin trend -not yet below transfusion  threshold  DM2 CBG elevated above goal at 200-225 -adjust insulin therapy and follow  Morbid obesity - Body mass index is 48.97 kg/m.   Family Communication: Discussed care with patient and his wife at bedside at length Disposition: Appears the patient will need a rehab stay after he is medically stable for discharge   Objective: Blood pressure 124/80, pulse (!) 113, temperature 98.1 F (36.7 C), temperature source Oral, resp. rate 16, height 5\' 8"  (1.727 m), weight (!) 146.1 kg, SpO2 100 %.  Intake/Output Summary (Last 24 hours) at 11/18/2022 1155 Last data filed at 11/18/2022 1009 Gross per 24 hour  Intake 240 ml  Output 1900 ml  Net -1660 ml   Filed Weights   11/11/22 2258 11/17/22 0500 11/18/22 0500  Weight: (!) 146.1 kg (!) 146.1 kg (!) 146.1 kg    Examination: General: No acute respiratory distress Lungs: Clear to auscultation bilaterally without wheezes or crackles Cardiovascular: Tachycardic but regular without murmur or rub Abdomen: Nontender, nondistended, soft, bowel sounds positive, no rebound, no ascites, no appreciable mass Extremities: 2+ bilateral lower extremity edema  CBC: Recent Labs  Lab 11/15/22 0020 11/16/22 0356 11/17/22 0132 11/18/22 0648  WBC 10.2 11.2* 12.7* 8.4  NEUTROABS 7.4 8.0*  --  5.7  HGB 7.7* 8.1* 8.9* 7.9*  HCT 25.8* 27.1* 29.5* 26.4*  MCV 93.1 92.8 89.4 87.4  PLT 489* 508* 648* 504*   Basic Metabolic Panel: Recent Labs  Lab 11/15/22 0020 11/16/22 0356 11/17/22 0132 11/18/22 0648  NA 139 133* 135 135  K 4.6 4.5 4.0 3.9  CL 102 99 94* 96*  CO2 28 26 27 30   GLUCOSE 189* 204* 200* 200*  BUN 11 12 14 15   CREATININE 1.38* 1.23 1.21 1.15  CALCIUM 8.7* 8.5* 8.9 8.4*  MG 2.0 1.9 2.1 2.0  PHOS 3.3 3.1  --  3.5   GFR: Estimated Creatinine Clearance: 99.7 mL/min (by C-G formula based on SCr of 1.15 mg/dL).   Scheduled Meds:  arformoterol  15 mcg Nebulization BID   aspirin EC  325 mg Oral Daily   atorvastatin  40 mg Oral  q1800   budesonide (PULMICORT) nebulizer solution  0.25 mg Nebulization BID   cholecalciferol  1,000 Units Oral Daily   DULoxetine  120 mg Oral QHS   enoxaparin (LOVENOX) injection  40 mg Subcutaneous Q24H   famotidine  40 mg Oral QHS   furosemide  40 mg Intravenous Q12H   gabapentin  300 mg Oral QHS   insulin aspart  0-24 Units Subcutaneous TID WC   insulin detemir  20 Units Subcutaneous Daily   iron polysaccharides  150 mg Oral Daily   multivitamin with minerals  1 tablet Oral Daily   traZODone  50 mg Oral QHS  GVC   LOS: 4 days   Lonia Blood, MD [facility] Office  [phone] Pager - Text Page per Loretha Stapler  If 7PM-7AM, please contact night-coverage per Amion 11/18/2022, 11:55 AM

## 2022-11-18 NOTE — Inpatient Diabetes Management (Addendum)
Inpatient Diabetes Program Recommendations  AACE/ADA: New Consensus Statement on Inpatient Glycemic Control (2015)  Target Ranges:  Prepandial:   less than 140 mg/dL      Peak postprandial:   less than 180 mg/dL (1-2 hours)      Critically ill patients:  140 - 180 mg/dL   Lab Results  Component Value Date   GLUCAP 200 (H) 11/18/2022   HGBA1C 10.0 (H) 10/28/2022    Review of Glycemic Control  Latest Reference Range & Units 11/16/22 08:59 11/16/22 13:08 11/16/22 17:14 11/16/22 20:46 11/17/22 07:53 11/17/22 11:24  Glucose-Capillary 70 - 99 mg/dL 409 (H) 811 (H) 914 (H) 173 (H) 206 (H) 214 (H)  (H): Data is abnormally high  Diabetes history: DM2 Outpatient Diabetes medications: Lantus 30 units every day, Jardiance 25 mg every day, Metformin 1000 mg BID,  Current orders for Inpatient glycemic control:  Levemir 20 units daily Novolog 0-24 units TID  Inpatient Diabetes Program Recommendations:    -  Consider increasing Levemir 25 units (home dose is 30 units) -  Decrease Novolog Correction scale to 0-15 units tid + hs  Will continue to follow while inpatient.  Thanks,  Christena Deem RN, MSN, BC-ADM Inpatient Diabetes Coordinator Team Pager [phone] (8a-5p)

## 2022-11-18 NOTE — Plan of Care (Signed)
  Problem: Education: Goal: Will demonstrate proper wound care and an understanding of methods to prevent future damage Outcome: Progressing Goal: Knowledge of disease or condition will improve Outcome: Progressing Goal: Knowledge of the prescribed therapeutic regimen will improve Outcome: Progressing Goal: Individualized Educational Video(s) Outcome: Progressing   Problem: Activity: Goal: Risk for activity intolerance will decrease Outcome: Progressing   Problem: Cardiac: Goal: Will achieve and/or maintain hemodynamic stability Outcome: Progressing   Problem: Clinical Measurements: Goal: Postoperative complications will be avoided or minimized Outcome: Progressing   Problem: Respiratory: Goal: Respiratory status will improve Outcome: Progressing   Problem: Skin Integrity: Goal: Wound healing without signs and symptoms of infection Outcome: Progressing Goal: Risk for impaired skin integrity will decrease Outcome: Progressing   Problem: Urinary Elimination: Goal: Ability to achieve and maintain adequate renal perfusion and functioning will improve Outcome: Progressing   Problem: Education: Goal: Understanding of CV disease, CV risk reduction, and recovery process will improve Outcome: Progressing Goal: Individualized Educational Video(s) Outcome: Progressing   Problem: Activity: Goal: Ability to return to baseline activity level will improve Outcome: Progressing   Problem: Cardiovascular: Goal: Ability to achieve and maintain adequate cardiovascular perfusion will improve Outcome: Progressing Goal: Vascular access site(s) Level 0-1 will be maintained Outcome: Progressing   Problem: Health Behavior/Discharge Planning: Goal: Ability to safely manage health-related needs after discharge will improve Outcome: Progressing   Problem: Education: Goal: Knowledge of General Education information will improve Description: Including pain rating scale, medication(s)/side  effects and non-pharmacologic comfort measures Outcome: Progressing   Problem: Health Behavior/Discharge Planning: Goal: Ability to manage health-related needs will improve Outcome: Progressing   Problem: Clinical Measurements: Goal: Ability to maintain clinical measurements within normal limits will improve Outcome: Progressing Goal: Will remain free from infection Outcome: Progressing Goal: Diagnostic test results will improve Outcome: Progressing Goal: Respiratory complications will improve Outcome: Progressing Goal: Cardiovascular complication will be avoided Outcome: Progressing   Problem: Activity: Goal: Risk for activity intolerance will decrease Outcome: Progressing   Problem: Nutrition: Goal: Adequate nutrition will be maintained Outcome: Progressing   Problem: Coping: Goal: Level of anxiety will decrease Outcome: Progressing   Problem: Elimination: Goal: Will not experience complications related to bowel motility Outcome: Progressing Goal: Will not experience complications related to urinary retention Outcome: Progressing   Problem: Pain Managment: Goal: General experience of comfort will improve Outcome: Progressing   Problem: Safety: Goal: Ability to remain free from injury will improve Outcome: Progressing   Problem: Skin Integrity: Goal: Risk for impaired skin integrity will decrease Outcome: Progressing

## 2022-11-18 NOTE — Progress Notes (Signed)
Nutrition Follow-up  DOCUMENTATION CODES:   Obesity unspecified  INTERVENTION:   Liberalize diet to Carb Modified with No Salt Packets, Remove Fluid restriction per discussion with Dr. Sharon Seller. Discussed with RN and pt as well  Continue MVI with Minerals, Vitamin D  Provided education on diabetic diet, heart healthy diet to pt and his wife.   NUTRITION DIAGNOSIS:   Increased nutrient needs related to chronic illness (CHF) as evidenced by estimated needs.  Being addressed  GOAL:   Patient will meet greater than or equal to 90% of their needs  Progressing  MONITOR:   PO intake, Labs, I & O's  REASON FOR ASSESSMENT:   Consult Assessment of nutrition requirement/status, Diet education  ASSESSMENT:   57 y.o. male presented to the ED with chest discomfort. PMH includes CAD, HTN, T2DM, HLD, CHF, and depression. Pt admitted with failure to thrive.  Pt reports appetite improving, eating better. Pt mostly frustrated about his change in diet in the last 24 hour. Noted pt previously on Regular diet and then diet modified to Carb Modified/Heart Healthy with 1500 mL fluid restriction. Discussed current diet with Dr. Sharon Seller who agrees with removal of fluid restriction.   Recent recorded po intake 100%   Pt had many questions regarding how many carb servings he is allowed to have. RD discussed with pt and his wife; also reviewed basics of carb controlled diabetic diet.   Labs: CBGs 103-225 Meds: ss novolog, cholecalciferol, Vitamin B12, levemir, iron polysaccharides, MVI with Minerals, lasix  Diet Order:  Carb Modified/Heart Healthy with 1500 mL fluid restriction   EDUCATION NEEDS:   Education needs have been addressed  Skin:  Skin Assessment: Reviewed RN Assessment  Last BM:  7/1  Height:   Ht Readings from Last 1 Encounters:  11/11/22 5\' 8"  (1.727 m)    Weight:   Wt Readings from Last 1 Encounters:  11/18/22 (!) 146.1 kg    Ideal Body Weight:  70 kg  BMI:   Body mass index is 48.97 kg/m.  Estimated Nutritional Needs:   Kcal:  2100-2300  Protein:  105-125 grams  Fluid:  >/= 2 L   Romelle Starcher MS, RDN, LDN, CNSC Registered Dietitian 3 Clinical Nutrition RD Pager and On-Call Pager Number Located in [place]

## 2022-11-18 NOTE — Progress Notes (Addendum)
NAME:  Brett Marshall, MRN:  [id number], DOB:  August 30, 1965, LOS: 4 ADMISSION DATE:  11/11/2022, CONSULTATION DATE: 11/15/2022 REFERRING MD: Dr. Marland Mcalpine, CHIEF COMPLAINT: Noninvasive ventilation  History of Present Illness:  This is a 57 year old gentleman, past medical history of severe AAS, coronary artery disease, diastolic heart failure, OSA, type 2 diabetes presented for a planned AVR in June 2024.  Postop was admitted to the intensive care unit was cared for by IR team.  Has asthma chronic diastolic heart failure and mild pulmonary hypertension.Was discharged from the hospital on 11/02/2022.  Since his hospital discharge from his surgery he has been seen in the emergency department 2 times and admitted to the hospital.  This admission was for chest pain had a prior heart catheterization coronary disease, valve replacement doing well uncomplicated course.  However just has ongoing failure to thrive symptoms with weakness and shortness of breath.  He was found to have mild AKI.  Pulmonary was consulted  Pertinent  Medical History   Past Medical History:  Diagnosis Date   Asthma    CAD (coronary artery disease)    a. 08/2017 Cath: Diff nonobs dzs, EF 45-50%, diff HK; b. 08/2021 MV: Ant/apical ishemia; c. 08/2021 PCI: LAD 95ost (4.0x15 Onyx Frontier DES), EF 55-65%; c. 09/2021 Cath: LM nl, LAD patent stent, 60m, 30d, RI min irregs, LCX min irregs, RCA 50p. LVOT grad 50-41mmHg (rest), 70-196mmHg (provoked).   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 08/2017 LV gram: EF 45-50%, glob HK; b. 08/2021 Echo: EF 60-65%, no rwma, GrI DD, nl RV fxn; c. 09/2021 cMRI: EF 60%, no LVOT obstruction/gradient. Mild conc LVH, mild AS. No myocardial scar/fibrosis. No evidence of HCM.    Hyperlipidemia    Leg swelling    Left   Low back pain    Mixed ICM & NICM    a. 08/2017 LV gram: EF 45-50%, glob HK; b. 08/2021 Echo: EF 60-65%.   Morbid obesity (HCC)    OSA on CPAP    non compliant   Pulmonary hypertension (HCC)      Significant Hospital Events: Including procedures, antibiotic start and stop dates in addition to other pertinent events     Interim History / Subjective:  Appears to be improving Objective   Blood pressure 115/88, pulse (!) 108, temperature 97.8 F (36.6 C), resp. rate 20, height 5\' 8"  (1.727 m), weight (!) 146.1 kg, SpO2 100 %.        Intake/Output Summary (Last 24 hours) at 11/18/2022 0901 Last data filed at 11/18/2022 0600 Gross per 24 hour  Intake --  Output 1000 ml  Net -1000 ml   Filed Weights   11/11/22 2258 11/17/22 0500 11/18/22 0500  Weight: (!) 146.1 kg (!) 146.1 kg (!) 146.1 kg    Examination: Morbid obese male sitting in chair on room air no acute distress No JVD lymphadenopathy is appreciated Decreased breath sounds throughout Heart sounds are distant sinus tachycardia tender to touch in surgical area Abdomen is obese soft nontender Lower extremities with 2-3+ edema but better   Resolved Hospital Problem list     Assessment & Plan:   Shortness of breath, dyspnea on exertion- Multifactorial etiology. Acutely anemia and hypervolemia  with bilateral pleural effusions are contributing most with contribution from deconditioning. He seems to have a component of bronchospasm if he improves with nebs.  Restriction from cardiomegaly and obesity BMI 48, recent chest surgery with loss of chest wall compliance, OSA likely contribute chronically.   Currently on room air with adequate O2 saturations He will need nocturnal O2 when not on CPAP Continue bronchodilators Continue to monitor hemoglobin Accurate intake and output and weights will be beneficial Continue Brovana and Yupelri are at discharge she can use LAMA/LABA inhaler As  needed DuoNebs  Sinus tachycardia-- unsure if this is anemia; worry about a very small PE that is too small to detect on CT scan that could be contributing as well, although CT scans are good at detecting most PEs.  Hemoglobin is lower Continues to have sinus tachycardia but is not utilizing nocturnal CPAP Continue to monitor   IDA Recent Labs    11/17/22 0132 11/18/22 0648  HGB 8.9* 7.9*    Continue with iron supplementation Monitor hemoglobin      Labs   CBC: Recent Labs  Lab 11/14/22 0030 11/15/22 0020 11/16/22 0356 11/17/22 0132 11/18/22 0648  WBC 10.6* 10.2 11.2* 12.7* 8.4  NEUTROABS 7.7 7.4 8.0*  --  5.7  HGB 7.9* 7.7* 8.1* 8.9* 7.9*  HCT 25.9* 25.8* 27.1* 29.5* 26.4*  MCV 90.2 93.1 92.8 89.4 87.4  PLT 495* 489* 508* 648* 504*    Basic Metabolic Panel: Recent Labs  Lab 11/14/22 0030 11/15/22 0020 11/16/22 0356 11/17/22 0132 11/18/22 0648  NA 137 139 133* 135 135  K 4.1 4.6 4.5 4.0 3.9  CL 99 102 99 94* 96*  CO2 29 28 26 27 30   GLUCOSE 162* 189* 204* 200* 200*  BUN 14 11 12 14 15   CREATININE 1.67* 1.38* 1.23 1.21 1.15  CALCIUM 8.7* 8.7* 8.5* 8.9 8.4*  MG 1.9 2.0 1.9 2.1 2.0  PHOS 3.8 3.3 3.1  --  3.5   GFR: Estimated Creatinine Clearance: 99.7 mL/min (by C-G formula based on SCr of 1.15 mg/dL). Recent Labs  Lab 11/15/22 0020 11/16/22 0356 11/17/22 0132 11/18/22 0648  WBC 10.2 11.2* 12.7* 8.4      Brett Canales Nathalie Cavendish ACNP Acute Care Nurse Practitioner [facility] Please consult Amion 11/18/2022, 9:01 AM

## 2022-11-19 DIAGNOSIS — R627 Adult failure to thrive: Secondary | ICD-10-CM | POA: Diagnosis not present

## 2022-11-19 LAB — BASIC METABOLIC PANEL
Anion gap: 10 (ref 5–15)
BUN: 17 mg/dL (ref 6–20)
CO2: 28 mmol/L (ref 22–32)
Calcium: 8.4 mg/dL — ABNORMAL LOW (ref 8.9–10.3)
Chloride: 94 mmol/L — ABNORMAL LOW (ref 98–111)
Creatinine, Ser: 1.32 mg/dL — ABNORMAL HIGH (ref 0.61–1.24)
GFR, Estimated: >60 mL/min (ref >60)
Glucose, Bld: 191 mg/dL — ABNORMAL HIGH (ref 70–99)
Potassium: 3.9 mmol/L (ref 3.5–5.1)
Sodium: 132 mmol/L — ABNORMAL LOW (ref 135–145)

## 2022-11-19 LAB — CORTISOL: Cortisol, Plasma: 12.3 ug/dL

## 2022-11-19 LAB — PROCALCITONIN: Procalcitonin: <0.1 ng/mL

## 2022-11-19 LAB — CBC
HCT: 26.9 % — ABNORMAL LOW (ref 39.0–52.0)
Hemoglobin: 8.1 g/dL — ABNORMAL LOW (ref 13.0–17.0)
MCH: 26.9 pg (ref 26.0–34.0)
MCHC: 30.1 g/dL (ref 30.0–36.0)
MCV: 89.4 fL (ref 80.0–100.0)
Platelets: 380 10*3/uL (ref 150–400)
RBC: 3.01 MIL/uL — ABNORMAL LOW (ref 4.22–5.81)
RDW: 15.7 % — ABNORMAL HIGH (ref 11.5–15.5)
WBC: 9.1 10*3/uL (ref 4.0–10.5)
nRBC: 0.4 % — ABNORMAL HIGH (ref 0.0–0.2)

## 2022-11-19 LAB — SEDIMENTATION RATE: Sed Rate: 128 mm/h — ABNORMAL HIGH (ref 0–16)

## 2022-11-19 LAB — MAGNESIUM: Magnesium: 2.1 mg/dL (ref 1.7–2.4)

## 2022-11-19 LAB — GLUCOSE, CAPILLARY
Glucose-Capillary: 229 mg/dL — ABNORMAL HIGH (ref 70–99)
Glucose-Capillary: 189 mg/dL — ABNORMAL HIGH (ref 70–99)
Glucose-Capillary: 147 mg/dL — ABNORMAL HIGH (ref 70–99)
Glucose-Capillary: 192 mg/dL — ABNORMAL HIGH (ref 70–99)

## 2022-11-19 MED ORDER — TRAZODONE HCL 50 MG PO TABS
50.0000 mg | ORAL_TABLET | Freq: Every evening | ORAL | Status: DC | PRN
  Administered 2022-11-19: 50 mg via ORAL
  Filled 2022-11-19: qty 1

## 2022-11-19 MED ORDER — SODIUM CHLORIDE 0.9 % IV BOLUS
250.0000 mL | Freq: Once | INTRAVENOUS | Status: AC
  Administered 2022-11-19: 250 mL via INTRAVENOUS

## 2022-11-19 NOTE — Progress Notes (Signed)
   11/19/22 0013  BiPAP/CPAP/SIPAP  Reason BIPAP/CPAP not in use Non-compliant   Pt refused CPAP for night time use. Pt states his wife will be bringing his unit from home in the morning.

## 2022-11-19 NOTE — Progress Notes (Signed)
Physical Therapy Treatment Patient Details Name: Brett Marshall MRN: [id number] DOB: [birth year] Today's Date: 11/19/2022   History of Present Illness Pt is 57 year old [facility] on [facility]/26 with failure to thrive in post operative setting at home after office visit on 6/26.  He reported a fall on 6/24 and was seen in ED at [facility] but sent home. Patient denies hitting head but reports pain near sternal incision (AVR 10/30/22). Patient's spouse stated that he had fallen on his buttocks but now is complaining of difficulty breathing and only taking shallow breaths. PMH - Recent admission on 10/30/22 for AVR, CAD, Aortic stenosis, DM, cardiomyopathy, morbid obesity, pulmonary HTN, depression    PT Comments  Pt with poor tolerance to treatment today. Pt only able to ambulate short distance in hallway today before needing to sit down due to dizziness and nausea. BP taken in hallway and found to be 118/68. Pt appeared to be much more deconditioned today compared to previous and discussed SNF at DC with pt and wife however pt declined stating that he would prefer to go home. No change in DC/DME recs at this time. Pt does have history of vertigo and dizziness today did not appear to be BP related. May benefit from vestibular eval tomorrow. PT will continue to follow.     Assistance Recommended at Discharge Intermittent Supervision/Assistance  If plan is discharge home, recommend the following:  Can travel by private vehicle    A little help with walking and/or transfers;A lot of help with bathing/dressing/bathroom;Assistance with cooking/housework;Assist for transportation;Help with stairs or ramp for entrance      Equipment Recommendations  None recommended by PT    Recommendations for Other Services       Precautions / Restrictions Precautions Precautions: Fall;Sternal;Other (comment) Precaution Booklet Issued: No Precaution Comments: watch BP, pt also reports some hx  vertigo Restrictions Weight Bearing Restrictions: Yes RUE Weight Bearing: Touch down weight bearing LUE Weight Bearing: Touch down weight bearing Other Position/Activity Restrictions: sternal prec     Mobility  Bed Mobility Overal bed mobility: Modified Independent Bed Mobility: Supine to Sit     Supine to sit: Modified independent (Device/Increase time)     General bed mobility comments: Able to maintain sternal precautions    Transfers Overall transfer level: Independent Equipment used: None Transfers: Sit to/from Stand Sit to Stand: Independent           General transfer comment: Maintained sternal precautions    Ambulation/Gait Ambulation/Gait assistance: Supervision Gait Distance (Feet): 75 Feet Assistive device: None Gait Pattern/deviations: Step-through pattern, Decreased stride length Gait velocity: decreased     General Gait Details: Distance limited by dizziness and nausea today requiring 1 seated rest break in the hallway.   Stairs             Wheelchair Mobility     Tilt Bed    Modified Rankin (Stroke Patients Only)       Balance Overall balance assessment: Mild deficits observed, not formally tested                                          Cognition Arousal/Alertness: Awake/alert Behavior During Therapy: WFL for tasks assessed/performed Overall Cognitive Status: Within Functional Limits for tasks assessed  General Comments: spouse present and also receptive to instruction.        Exercises      General Comments General comments (skin integrity, edema, etc.): BP: 118/68 seated in hallway after dizziness episode.      Pertinent Vitals/Pain Pain Assessment Pain Assessment: Faces Faces Pain Scale: Hurts little more Pain Location: chest incision Pain Descriptors / Indicators: Guarding, Discomfort, Grimacing Pain Intervention(s): Monitored during session,  Patient requesting pain meds-RN notified, Limited activity within patient's tolerance    Home Living                          Prior Function            PT Goals (current goals can now be found in the care plan section) Progress towards PT goals: Not progressing toward goals - comment (Limited by dizziness today.)    Frequency    Min 1X/week      PT Plan Current plan remains appropriate    Co-evaluation              AM-PAC PT "6 Clicks" Mobility   Outcome Measure  Help needed turning from your back to your side while in a flat bed without using bedrails?: None Help needed moving from lying on your back to sitting on the side of a flat bed without using bedrails?: None Help needed moving to and from a bed to a chair (including a wheelchair)?: None Help needed standing up from a chair using your arms (e.g., wheelchair or bedside chair)?: None Help needed to walk in hospital room?: A Little Help needed climbing 3-5 steps with a railing? : A Little 6 Click Score: 22    End of Session   Activity Tolerance: Patient limited by fatigue Patient left: in bed;with call bell/phone within reach;with family/visitor present Nurse Communication: Mobility status;Patient requests pain meds PT Visit Diagnosis: Other abnormalities of gait and mobility (R26.89);Muscle weakness (generalized) (M62.81);Pain;Dizziness and giddiness (R42)     Time: 4098-1191 PT Time Calculation (min) (ACUTE ONLY): 15 min  Charges:    $Gait Training: 8-22 mins PT General Charges $$ ACUTE PT VISIT: 1 Visit                     [name], PT, DPT Acute Rehab Services [phone]    Gladys Damme 11/19/2022, 4:40 PM

## 2022-11-19 NOTE — Plan of Care (Signed)
  Problem: Respiratory: Goal: Respiratory status will improve Outcome: Progressing   Problem: Urinary Elimination: Goal: Ability to achieve and maintain adequate renal perfusion and functioning will improve Outcome: Progressing   Problem: Education: Goal: Knowledge of General Education information will improve Description: Including pain rating scale, medication(s)/side effects and non-pharmacologic comfort measures Outcome: Progressing   Problem: Health Behavior/Discharge Planning: Goal: Ability to manage health-related needs will improve Outcome: Progressing

## 2022-11-19 NOTE — Progress Notes (Signed)
Brett Marshall  ZOX:[id number] DOB: [birth year] DOA: 11/11/2022 PCP: Ethelda Chick, MD    Brief Narrative:  57 year old with a history of morbid obesity, CAD, DM 2, CHF, HLD, and pulmonary hypertension who underwent an aortic valve replacement 10/30/2022 for treatment of aortic stenosis.  Though his procedure went well he has had difficulty recovering at home postoperatively with progressive decline in his strength and functional abilities with significant persisting dyspnea.  Goals of Care:  Code Status: Full Code   DVT prophylaxis: Lovenox  Interim Hx: No acute events recorded overnight.  Afebrile.  Vital signs stable.  Tachycardia has improved with use of scheduled metoprolol.  Oxygen saturation 100% on 2 L.  Patient reports significant lightheadedness associated with nausea with attempts to stand up and ambulate.  No chest pain.  No vomiting.  Blood pressure has dipped into the 90s on metoprolol.  Assessment & Plan:  Persistent dyspnea - generalized weakness Procalcitonin, physical exam, and CXR not consistent with pneumonia/pulmonary infection -CTA without evidence of pulmonary embolism -no evidence of significant pulmonary parenchymal disease or pleural effusion -no evidence of pericardial effusion -TTE suggest preserved cardiac function and stable valve function -no evidence of significant thyroid dysfunction -adrenal axis appears intact -B12 is deficient and is being replaced -otherwise no glaring etiology for the patient's deconditioned state -continue PT/OT  Reactive airway disease Care per Pulmonary with no other acute interventions/therapies presently indicated   Sinus tachycardia TSH elevated at 7.6 but T3 and free T4 within normal limits/low normal - no evidence of PE on CTa at admission - cortisol level normal 16 days ago -recheck cortisol this am normal again - no DVT on B LE venous duplex -responded well to beta-blocker but patient was not able to tolerate due to symptomatic  modest hypotension -hold beta-blocker for today and consider resuming at lower dose tomorrow  B12 deficiency B12 below clinical goal at 212 -supplement - folic acid level was normal -subcu replacement being administered during hospital with plan to transition to oral at time of discharge -will need recheck of B12 level in outpatient setting in 6-8 weeks  Iron deficiency Iron studies suggest low iron as well as low TIBC likely related to poor nutritional status -ferritin 69 therefore not severely depressed -oral iron therapy ongoing  Vitamin D deficiency Vitamin D markedly low at 8.02 - initiated supplementation during this hospital stay  Acute kidney injury Creatinine appears stable -follow trend  CAD status post PCI 2023 No symptoms to suggest acute coronary syndrome or anginal equivalent  Bicuspid aortic valve/aortic stenosis status post surgical replacement 10/30/2022 Valve appears to be functioning normally on follow-up TTE  Normocytic anemia Monitor hemoglobin trend -not yet below transfusion threshold and in fact appears to be improving at present  DM2 CBG elevated above goal at 200-225 -adjust insulin therapy and follow  Morbid obesity - Body mass index is 47.99 kg/m.   Family Communication: Discussed care with patient and his wife at bedside at length Disposition: Patient desires discharge home with home health PT/OT rather than considering a rehab stay   Objective: Blood pressure 104/69, pulse 88, temperature 98.2 F (36.8 C), temperature source Oral, resp. rate 18, height 5\' 8"  (1.727 m), weight (!) 143.2 kg, SpO2 100 %.  Intake/Output Summary (Last 24 hours) at 11/19/2022 1037 Last data filed at 11/19/2022 0300 Gross per 24 hour  Intake 240 ml  Output 1300 ml  Net -1060 ml    Filed Weights   11/17/22 0500 11/18/22 0500 11/19/22 0424  Weight: (!) 146.1 kg (!) 146.1 kg (!) 143.2 kg    Examination: General: No acute respiratory distress Lungs: Clear to  auscultation bilaterally -no wheezing or crackles Cardiovascular: RRR without murmur or rub Abdomen: Nontender, nondistended, soft, bowel sounds positive, no rebound Extremities: 2+ bilateral lower extremity edema without change  CBC: Recent Labs  Lab 11/15/22 0020 11/16/22 0356 11/17/22 0132 11/18/22 0648 11/19/22 0229  WBC 10.2 11.2* 12.7* 8.4 9.1  NEUTROABS 7.4 8.0*  --  5.7  --   HGB 7.7* 8.1* 8.9* 7.9* 8.1*  HCT 25.8* 27.1* 29.5* 26.4* 26.9*  MCV 93.1 92.8 89.4 87.4 89.4  PLT 489* 508* 648* 504* 380    Basic Metabolic Panel: Recent Labs  Lab 11/15/22 0020 11/16/22 0356 11/17/22 0132 11/18/22 0648 11/19/22 0229  NA 139 133* 135 135 132*  K 4.6 4.5 4.0 3.9 3.9  CL 102 99 94* 96* 94*  CO2 28 26 27 30 28   GLUCOSE 189* 204* 200* 200* 191*  BUN 11 12 14 15 17   CREATININE 1.38* 1.23 1.21 1.15 1.32*  CALCIUM 8.7* 8.5* 8.9 8.4* 8.4*  MG 2.0 1.9 2.1 2.0 2.1  PHOS 3.3 3.1  --  3.5  --     GFR: Estimated Creatinine Clearance: 85.8 mL/min (A) (by C-G formula based on SCr of 1.32 mg/dL (H)).   Scheduled Meds:  arformoterol  15 mcg Nebulization BID   aspirin EC  325 mg Oral Daily   atorvastatin  40 mg Oral q1800   budesonide (PULMICORT) nebulizer solution  0.25 mg Nebulization BID   cholecalciferol  1,000 Units Oral Daily   cyanocobalamin  1,000 mcg Subcutaneous Daily   DULoxetine  120 mg Oral QHS   enoxaparin (LOVENOX) injection  40 mg Subcutaneous Q24H   famotidine  40 mg Oral QHS   furosemide  40 mg Intravenous Q12H   gabapentin  300 mg Oral QHS   insulin aspart  0-20 Units Subcutaneous TID WC   insulin aspart  0-5 Units Subcutaneous QHS   insulin detemir  20 Units Subcutaneous Daily   iron polysaccharides  150 mg Oral Daily   metoprolol tartrate  25 mg Oral BID   multivitamin with minerals  1 tablet Oral Daily   traZODone  50 mg Oral QHS     LOS: 5 days   Lonia Blood, MD [facility] Office  [phone] Pager - Text Page per Loretha Stapler  If  7PM-7AM, please contact night-coverage per Amion 11/19/2022, 10:37 AM

## 2022-11-20 DIAGNOSIS — R627 Adult failure to thrive: Secondary | ICD-10-CM | POA: Diagnosis not present

## 2022-11-20 LAB — BASIC METABOLIC PANEL
Anion gap: 8 (ref 5–15)
BUN: 18 mg/dL (ref 6–20)
CO2: 29 mmol/L (ref 22–32)
Calcium: 8.7 mg/dL — ABNORMAL LOW (ref 8.9–10.3)
Chloride: 97 mmol/L — ABNORMAL LOW (ref 98–111)
Creatinine, Ser: 1.28 mg/dL — ABNORMAL HIGH (ref 0.61–1.24)
GFR, Estimated: >60 mL/min (ref >60)
Glucose, Bld: 205 mg/dL — ABNORMAL HIGH (ref 70–99)
Potassium: 3.9 mmol/L (ref 3.5–5.1)
Sodium: 134 mmol/L — ABNORMAL LOW (ref 135–145)

## 2022-11-20 LAB — CBC
HCT: 26.5 % — ABNORMAL LOW (ref 39.0–52.0)
Hemoglobin: 8 g/dL — ABNORMAL LOW (ref 13.0–17.0)
MCH: 26.8 pg (ref 26.0–34.0)
MCHC: 30.2 g/dL (ref 30.0–36.0)
MCV: 88.9 fL (ref 80.0–100.0)
Platelets: 467 10*3/uL — ABNORMAL HIGH (ref 150–400)
RBC: 2.98 MIL/uL — ABNORMAL LOW (ref 4.22–5.81)
RDW: 15.4 % (ref 11.5–15.5)
WBC: 7.3 10*3/uL (ref 4.0–10.5)
nRBC: 0.4 % — ABNORMAL HIGH (ref 0.0–0.2)

## 2022-11-20 LAB — GLUCOSE, CAPILLARY
Glucose-Capillary: 160 mg/dL — ABNORMAL HIGH (ref 70–99)
Glucose-Capillary: 238 mg/dL — ABNORMAL HIGH (ref 70–99)
Glucose-Capillary: 127 mg/dL — ABNORMAL HIGH (ref 70–99)
Glucose-Capillary: 268 mg/dL — ABNORMAL HIGH (ref 70–99)

## 2022-11-20 NOTE — Evaluation (Signed)
Occupational Therapy Evaluation Patient Details Name: Brett Marshall MRN: [id number] DOB: [birth year] Today's Date: 11/20/2022   History of Present Illness Pt is 57 year old presents to [facility] on 6/26 with failure to thrive in post operative setting at home after office visit on 6/26.  He reported a fall on 6/24 and was seen in ED at [facility] but sent home. Patient denies hitting head but reports pain near sternal incision (AVR 10/30/22). Patient's spouse stated that he had fallen on his buttocks but now is complaining of difficulty breathing and only taking shallow breaths. PMH - Recent admission on 10/30/22 for AVR, CAD, Aortic stenosis, DM, cardiomyopathy, morbid obesity, pulmonary HTN, depression   Clinical Impression   Pt s/p above diagnosis, seen for re-eval due to decreased activity tolerance and dizziness with ambulation. Pt states pain 9/10, does not appear to be in discomfort, able to perform transfers and functional ADLs as needed, no grimacing or pain noted. Pt further instructed on sternal precautions and use of AD for LB dressing, able to perform mod I. Pt able to ambulate to restroom and back with supervision, no LOB. Pt completed bathing with wife earlier with set up/supervision, extremely tired after. Pt would benefit from continued skilled therapy to improve activity tolerance, HHOT follow up recommended to ensure safety with ADLs and mobility in home environment. Pt requesting tub bench, would benefit for safety with shower due to decreased activity tolerance.      Recommendations for follow up therapy are one component of a multi-disciplinary discharge planning process, led by the attending physician.  Recommendations may be updated based on patient status, additional functional criteria and insurance authorization.   Assistance Recommended at Discharge Set up Supervision/Assistance  Patient can return home with the following A little help with bathing/dressing/bathroom;Assist for  transportation;Help with stairs or ramp for entrance    Functional Status Assessment  Patient has had a recent decline in their functional status and demonstrates the ability to make significant improvements in function in a reasonable and predictable amount of time.  Equipment Recommendations  None recommended by OT    Recommendations for Other Services       Precautions / Restrictions Precautions Precautions: Fall;Sternal;Other (comment) Precaution Booklet Issued: No Precaution Comments: watch BP, pt also reports some hx vertigo Restrictions Weight Bearing Restrictions: Yes RUE Weight Bearing: Touch down weight bearing LUE Weight Bearing: Touch down weight bearing Other Position/Activity Restrictions: sternal prec      Mobility Bed Mobility Overal bed mobility: Modified Independent             General bed mobility comments: arrived in chair    Transfers Overall transfer level: Independent Equipment used: None Transfers: Sit to/from Stand Sit to Stand: Independent           General transfer comment: Maintained sternal precautions      Balance Overall balance assessment: Mild deficits observed, not formally tested Sitting-balance support: Feet supported Sitting balance-Leahy Scale: Good Sitting balance - Comments: recliner ADLs   Standing balance support: No upper extremity supported Standing balance-Leahy Scale: Good Standing balance comment: no AD, decreased activity tolerance, hx of dizziness with ambulation                           ADL either performed or assessed with clinical judgement   ADL Overall ADL's : Needs assistance/impaired Eating/Feeding: Independent   Grooming: Modified independent   Upper Body Bathing: Set up;Sitting   Lower Body Bathing: Set  up;Supervison/ safety;Sitting/lateral leans   Upper Body Dressing : Modified independent   Lower Body Dressing: Modified independent;With adaptive equipment;Sit to/from stand    Toilet Transfer: Independent   Toileting- Architect and Hygiene: Independent       Functional mobility during ADLs: Independent General ADL Comments: Pt independent with transfer/mobility, able to gather items needed for dressing, instructed on use of sock aide/reacher for socks/shoes, able to perform mod I, requires help for LB dressing at baseline     Vision Baseline Vision/History: 1 Wears glasses Ability to See in Adequate Light: 0 Adequate Patient Visual Report: No change from baseline       Perception     Praxis      Pertinent Vitals/Pain Pain Assessment Pain Assessment: 0-10 Pain Score: 9  Faces Pain Scale: Hurts little more Pain Location: R chest Pain Descriptors / Indicators: Aching Pain Intervention(s): Monitored during session     Hand Dominance Left   Extremity/Trunk Assessment Upper Extremity Assessment Upper Extremity Assessment: Overall WFL for tasks assessed;RUE deficits/detail;LUE deficits/detail RUE Deficits / Details: sternal precautions, occassional numbness, hx of neuropathy RUE Sensation: history of peripheral neuropathy LUE Deficits / Details: sternal precautions, occassional numbness, hx of neuropathy LUE Sensation: history of peripheral neuropathy           Communication Communication Communication: No difficulties   Cognition Arousal/Alertness: Awake/alert Behavior During Therapy: WFL for tasks assessed/performed Overall Cognitive Status: Within Functional Limits for tasks assessed                                       General Comments       Exercises     Shoulder Instructions      Home Living Family/patient expects to be discharged to:: Private residence Living Arrangements: Spouse/significant other Available Help at Discharge: Family;Available 24 hours/day Type of Home: House Home Access: Stairs to enter [employer] of Steps: 3 Entrance Stairs-Rails: Right;Left;Can reach both Home  Layout: One level     Bathroom Shower/Tub: Chief Strategy Officer: Handicapped height Bathroom Accessibility: Yes How Accessible: Accessible via walker Home Equipment: Cane - single point;Rollator (4 wheels);BSC/3in1          Prior Functioning/Environment Prior Level of Function : Independent/Modified Independent;History of Falls (last six months)             Mobility Comments: Mod I furniture surfing or holding onto wife since surgery ADLs Comments: Pt reports ind even since surgery        OT Problem List: Decreased activity tolerance;Pain      OT Treatment/Interventions: Self-care/ADL training;Therapeutic exercise;Energy conservation;Patient/family education;Therapeutic activities    OT Goals(Current goals can be found in the care plan section) Acute Rehab OT Goals Patient Stated Goal: to return home and improve functional endurance OT Goal Formulation: With patient/family Time For Goal Achievement: 12/04/22 Potential to Achieve Goals: Good  OT Frequency: Min 2X/week    Co-evaluation              AM-PAC OT "6 Clicks" Daily Activity     Outcome Measure Help from another person eating meals?: None Help from another person taking care of personal grooming?: None Help from another person toileting, which includes using toliet, bedpan, or urinal?: None Help from another person bathing (including washing, rinsing, drying)?: A Little Help from another person to put on and taking off regular upper body clothing?: None Help from another person to  put on and taking off regular lower body clothing?: None 6 Click Score: 23   End of Session Nurse Communication: Mobility status  Activity Tolerance: Patient tolerated treatment well Patient left: in chair;with call bell/phone within reach;with nursing/sitter in room;with family/visitor present  OT Visit Diagnosis: History of falling (Z91.81);Muscle weakness (generalized) (M62.81);Pain Pain - part of body:   (chest)                Time: 1610-9604 OT Time Calculation (min): 14 min Charges:  OT General Charges $OT Visit: 1 Visit OT Evaluation $OT Eval Low Complexity: 1 Low  [address], OTR/L   Brett Marshall 11/20/2022, 4:13 PM

## 2022-11-20 NOTE — Progress Notes (Signed)
Brett Marshall  ONG:[id number] DOB: 09-11-1965 DOA: 11/11/2022 PCP: Ethelda Chick, MD    Brief Narrative:  57 year old with a history of morbid obesity, CAD, DM 2, CHF, HLD, and pulmonary hypertension who underwent an aortic valve replacement 10/30/2022 for treatment of aortic stenosis.  Though his procedure went well he has had difficulty recovering at home postoperatively with progressive decline in his strength and functional abilities with significant persisting dyspnea.  Goals of Care:  Code Status: Full Code   DVT prophylaxis: Lovenox  Interim Hx: Patient has reported some right upper quadrant abdominal pain this morning which radiates to his back.  There were no acute events reported overnight.  He is afebrile.  Vital signs are stable. At the time of my visit he is sitting up in bed, and appears improved in general. He denies new complaints at this time, and reports that he did better in his attempts to ambulate today.   Assessment & Plan:  Persistent dyspnea - generalized weakness Procalcitonin, physical exam, and CXR not consistent with pneumonia/pulmonary infection - CTa without evidence of pulmonary embolism - no evidence of significant pulmonary parenchymal disease or pleural effusion - no evidence of pericardial effusion - TTE suggests preserved cardiac function and stable valve function - no evidence of significant thyroid dysfunction - adrenal axis appears intact - B12 is deficient and is being replaced - otherwise no glaring etiology for the patient's state apart from significant generalized deconditioning - continue PT/OT  Reactive airway disease Care per Pulmonary with no other acute interventions/therapies presently indicated   Sinus tachycardia TSH elevated at 7.6 but T3 and free T4 within normal limits/low normal - no evidence of PE on CTa at admission - cortisol level normal at presentation - recheck cortisol 7/4 normal again - no DVT on B LE venous duplex - responded  well to beta-blocker but patient was not able to tolerate due to symptomatic modest hypotension   B12 deficiency B12 below clinical goal at 212 -supplement - folic acid level was normal -subcu replacement being administered during hospital with plan to transition to oral at time of discharge -will need recheck of B12 level in outpatient setting in 6-8 weeks  Iron deficiency Iron studies suggest low iron as well as low TIBC likely related to poor nutritional status -ferritin 69 therefore not severely depressed -oral iron therapy ongoing  Vitamin D deficiency Vitamin D markedly low at 8.02 - initiated supplementation during this hospital stay  Acute kidney injury Creatinine appears stable - follow trend  CAD status post PCI 2023 No symptoms to suggest acute coronary syndrome or anginal equivalent  Bicuspid aortic valve/aortic stenosis status post surgical replacement 10/30/2022 Valve appears to be functioning normally on follow-up TTE  Normocytic anemia Monitor hemoglobin trend -not yet below transfusion threshold and in fact appears to be improving at present  DM2 CBG elevated above goal at 200-225 -adjust insulin therapy and follow  Morbid obesity - Body mass index is 49.41 kg/m.   Family Communication: Discussed care with patient and his wife at bedside Disposition: Patient desires discharge home with home health PT/OT rather than considering a rehab stay   Objective: Blood pressure 106/80, pulse 100, temperature (!) 97.5 F (36.4 C), temperature source Oral, resp. rate 17, height 5\' 8"  (1.727 m), weight (!) 147.4 kg, SpO2 94 %. No intake or output data in the 24 hours ending 11/20/22 1020  Filed Weights   11/18/22 0500 11/19/22 0424 11/20/22 0409  Weight: (!) 146.1 kg (!) 143.2  kg (!) 147.4 kg    Examination: General: No acute respiratory distress Lungs: Clear to auscultation bilaterally -no wheezing or crackles Cardiovascular: RRR without murmur Abdomen: Nontender,  nondistended, soft, bowel sounds positive, no rebound Extremities: 2+ bilateral lower extremity edema without change  CBC: Recent Labs  Lab 11/15/22 0020 11/16/22 0356 11/17/22 0132 11/18/22 0648 11/19/22 0229 11/20/22 0230  WBC 10.2 11.2*   < > 8.4 9.1 7.3  NEUTROABS 7.4 8.0*  --  5.7  --   --   HGB 7.7* 8.1*   < > 7.9* 8.1* 8.0*  HCT 25.8* 27.1*   < > 26.4* 26.9* 26.5*  MCV 93.1 92.8   < > 87.4 89.4 88.9  PLT 489* 508*   < > 504* 380 467*   < > = values in this interval not displayed.    Basic Metabolic Panel: Recent Labs  Lab 11/15/22 0020 11/16/22 0356 11/17/22 0132 11/18/22 0648 11/19/22 0229 11/20/22 0230  NA 139 133* 135 135 132* 134*  K 4.6 4.5 4.0 3.9 3.9 3.9  CL 102 99 94* 96* 94* 97*  CO2 28 26 27 30 28 29   GLUCOSE 189* 204* 200* 200* 191* 205*  BUN 11 12 14 15 17 18   CREATININE 1.38* 1.23 1.21 1.15 1.32* 1.28*  CALCIUM 8.7* 8.5* 8.9 8.4* 8.4* 8.7*  MG 2.0 1.9 2.1 2.0 2.1  --   PHOS 3.3 3.1  --  3.5  --   --     GFR: Estimated Creatinine Clearance: 90.1 mL/min (A) (by C-G formula based on SCr of 1.28 mg/dL (H)).   Scheduled Meds:  arformoterol  15 mcg Nebulization BID   aspirin EC  325 mg Oral Daily   atorvastatin  40 mg Oral q1800   budesonide (PULMICORT) nebulizer solution  0.25 mg Nebulization BID   cholecalciferol  1,000 Units Oral Daily   cyanocobalamin  1,000 mcg Subcutaneous Daily   DULoxetine  120 mg Oral QHS   enoxaparin (LOVENOX) injection  40 mg Subcutaneous Q24H   famotidine  40 mg Oral QHS   gabapentin  300 mg Oral QHS   insulin aspart  0-20 Units Subcutaneous TID WC   insulin aspart  0-5 Units Subcutaneous QHS   insulin detemir  20 Units Subcutaneous Daily   iron polysaccharides  150 mg Oral Daily   multivitamin with minerals  1 tablet Oral Daily     LOS: 6 days   Lonia Blood, MD [facility] Office  [phone] Pager - Text Page per Amion  If 7PM-7AM, please contact night-coverage per Amion 11/20/2022, 10:20  AM

## 2022-11-20 NOTE — Progress Notes (Signed)
Physical Therapy Treatment Patient Details Name: Brett Marshall MRN: [id number] DOB: December 11, 1965 Today's Date: 11/20/2022   History of Present Illness Pt is 57 year old presents to [facility] on 6/26 with failure to thrive in post operative setting at home after office visit on 6/26.  He reported a fall on 6/24 and was seen in ED at [facility] but sent home. Patient denies hitting head but reports pain near sternal incision (AVR 10/30/22). Patient's spouse stated that he had fallen on his buttocks but now is complaining of difficulty breathing and only taking shallow breaths. PMH - Recent admission on 10/30/22 for AVR, CAD, Aortic stenosis, DM, cardiomyopathy, morbid obesity, pulmonary HTN, depression    PT Comments  Pt admitted with above diagnosis. Pt was able to tolerate vestibular testing and treated pt for left BPPV.  Pt also was able to ambulate with min assist and incr distance but still had episode where he needed to rest in standing as he felt SOB.  Noted O2 Saturation not picking up well but with pursed lip breathing, O2 improved.  Encouraged incentive spirometer use and pt agrees.  Will give pt exercises for vertigo as well as energy conservation techniques on next visit.  Pt currently with functional limitations due to the deficits listed below (see PT Problem List). Pt will benefit from acute skilled PT to increase their independence and safety with mobility to allow discharge.        Assistance Recommended at Discharge Intermittent Supervision/Assistance  If plan is discharge home, recommend the following:  Can travel by private vehicle    A little help with walking and/or transfers;A lot of help with bathing/dressing/bathroom;Assistance with cooking/housework;Assist for transportation;Help with stairs or ramp for entrance      Equipment Recommendations  None recommended by PT    Recommendations for Other Services       Precautions / Restrictions Precautions Precautions: Fall;Sternal;Other  (comment) Precaution Booklet Issued: No Precaution Comments: watch BP, pt also reports some hx vertigo Restrictions Weight Bearing Restrictions: Yes RUE Weight Bearing: Touch down weight bearing LUE Weight Bearing: Touch down weight bearing Other Position/Activity Restrictions: sternal prec     Mobility  Bed Mobility Overal bed mobility: Modified Independent Bed Mobility: Supine to Sit   Sidelying to sit: Min guard, HOB elevated Supine to sit: Modified independent (Device/Increase time) Sit to supine: Min guard   General bed mobility comments: Able to maintain sternal precautions    Transfers Overall transfer level: Independent Equipment used: None Transfers: Sit to/from Stand Sit to Stand: Independent           General transfer comment: Maintained sternal precautions    Ambulation/Gait Ambulation/Gait assistance: Supervision Gait Distance (Feet): 125 Feet Assistive device: None Gait Pattern/deviations: Step-through pattern, Decreased stride length Gait velocity: decreased Gait velocity interpretation: 1.31 - 2.62 ft/sec, indicative of limited community ambulator   General Gait Details: Distance limited by dizziness today requiring 1 standing rest break in the hallway.  Encouraged purse lip breathing as pt appears to desaturate.  Pulse ox not having good waveform but dipped to low 70's prior to not reading. O2 up to 93% with pursed lip breathing.   Stairs             Wheelchair Mobility     Tilt Bed    Modified Rankin (Stroke Patients Only)       Balance Overall balance assessment: Mild deficits observed, not formally tested Sitting-balance support: Feet supported Sitting balance-Leahy Scale: Good Sitting balance - Comments: maintains sternal  precautions   Standing balance support: No upper extremity supported Standing balance-Leahy Scale: Fair Standing balance comment: no AD. Compensates well for dizziness overall.                             Cognition Arousal/Alertness: Awake/alert Behavior During Therapy: WFL for tasks assessed/performed Overall Cognitive Status: Within Functional Limits for tasks assessed                                          Exercises Other Exercises Other Exercises: Discussed some energy conservation techniques with pt.  Will bring handout tomorrow.    General Comments General comments (skin integrity, edema, etc.): Tested for BPPV with left hypofunction noted.  Treated for left posterior canal BPPV.  Educated pt regarding BPPV as well as left hypofunction. Plan to give pt exercises tomorrow.      Pertinent Vitals/Pain Pain Assessment Pain Assessment: Faces Faces Pain Scale: Hurts little more Pain Location: back Pain Descriptors / Indicators: Guarding, Discomfort, Grimacing Pain Intervention(s): Limited activity within patient's tolerance, Monitored during session, Repositioned    Home Living                          Prior Function            PT Goals (current goals can now be found in the care plan section) Acute Rehab PT Goals Patient Stated Goal: home Progress towards PT goals: Progressing toward goals    Frequency    Min 1X/week      PT Plan Current plan remains appropriate    Co-evaluation              AM-PAC PT "6 Clicks" Mobility   Outcome Measure  Help needed turning from your back to your side while in a flat bed without using bedrails?: None Help needed moving from lying on your back to sitting on the side of a flat bed without using bedrails?: None Help needed moving to and from a bed to a chair (including a wheelchair)?: None Help needed standing up from a chair using your arms (e.g., wheelchair or bedside chair)?: None Help needed to walk in hospital room?: A Little Help needed climbing 3-5 steps with a railing? : A Little 6 Click Score: 22    End of Session Equipment Utilized During Treatment: Gait belt Activity  Tolerance: Patient limited by fatigue Patient left: with call bell/phone within reach;in chair Nurse Communication: Mobility status PT Visit Diagnosis: Other abnormalities of gait and mobility (R26.89);Muscle weakness (generalized) (M62.81);Pain;Dizziness and giddiness (R42) Pain - part of body:  (chest)     Time: 1610-9604 PT Time Calculation (min) (ACUTE ONLY): 36 min  Charges:    $Gait Training: 8-22 mins $Canalith Rep Proc: 8-22 mins PT General Charges $$ ACUTE PT VISIT: 1 Visit                     Josecarlos Harriott M,PT Acute Rehab Services [phone]    Bevelyn Buckles 11/20/2022, 10:17 AM

## 2022-11-21 DIAGNOSIS — R627 Adult failure to thrive: Secondary | ICD-10-CM | POA: Diagnosis not present

## 2022-11-21 LAB — GLUCOSE, CAPILLARY
Glucose-Capillary: 226 mg/dL — ABNORMAL HIGH (ref 70–99)
Glucose-Capillary: 179 mg/dL — ABNORMAL HIGH (ref 70–99)
Glucose-Capillary: 178 mg/dL — ABNORMAL HIGH (ref 70–99)
Glucose-Capillary: 156 mg/dL — ABNORMAL HIGH (ref 70–99)

## 2022-11-21 MED ORDER — MOMETASONE FURO-FORMOTEROL FUM 200-5 MCG/ACT IN AERO
2.0000 | INHALATION_SPRAY | Freq: Two times a day (BID) | RESPIRATORY_TRACT | Status: DC
  Administered 2022-11-21 – 2022-11-22 (×2): 2 via RESPIRATORY_TRACT
  Filled 2022-11-21: qty 8.8

## 2022-11-21 MED ORDER — METOPROLOL TARTRATE 12.5 MG HALF TABLET
12.5000 mg | ORAL_TABLET | Freq: Two times a day (BID) | ORAL | Status: DC
  Administered 2022-11-21 – 2022-11-22 (×2): 12.5 mg via ORAL
  Filled 2022-11-21 (×2): qty 1

## 2022-11-21 MED ORDER — POLYSACCHARIDE IRON COMPLEX 150 MG PO CAPS: 150.0000 mg | ORAL_CAPSULE | Freq: Every day | ORAL | Status: DC

## 2022-11-21 MED ORDER — OXYCODONE HCL 5 MG PO TABS: 5.0000 mg | ORAL_TABLET | ORAL | Status: DC | PRN

## 2022-11-21 MED ORDER — CLONAZEPAM 0.5 MG PO TABS: 0.5000 mg | ORAL_TABLET | Freq: Two times a day (BID) | ORAL | Status: DC | PRN

## 2022-11-21 MED ORDER — FAMOTIDINE 20 MG PO TABS
20.0000 mg | ORAL_TABLET | Freq: Two times a day (BID) | ORAL | Status: DC | PRN
  Administered 2022-11-21: 20 mg via ORAL
  Filled 2022-11-21: qty 1

## 2022-11-21 NOTE — Progress Notes (Signed)
Physical Therapy Treatment Patient Details Name: Brett Marshall MRN: [id number] DOB: 15-Jul-1965 Today's Date: 11/21/2022   History of Present Illness Pt is 57 year old presents to [facility] on 6/26 with failure to thrive in post operative setting at home after office visit on 6/26.  He reported a fall on 6/24 and was seen in ED at [facility] but sent home. Patient denies hitting head but reports pain near sternal incision (AVR 10/30/22). Patient's spouse stated that he had fallen on his buttocks but now is complaining of difficulty breathing and only taking shallow breaths. PMH - Recent admission on 10/30/22 for AVR, CAD, Aortic stenosis, DM, cardiomyopathy, morbid obesity, pulmonary HTN, depression    PT Comments  Pt admitted with above diagnosis. Pt was able to ambulate without LOB and needing cues for energy conservation and pursed lip breathing. Pt fatigues quickly. Pt also reassessed for Left posterior canal BPPV and performed Epley maneuver again. Pt given handouts for Energy Conservation, x1 eercises as well as BPPV exercises/BPPV explanation.  Will continue to follow acutely.  Pt currently with functional limitations due to the deficits listed below (see PT Problem List). Pt will benefit from acute skilled PT to increase their independence and safety with mobility to allow discharge.        Assistance Recommended at Discharge Intermittent Supervision/Assistance  If plan is discharge home, recommend the following:  Can travel by private vehicle    A little help with walking and/or transfers;A lot of help with bathing/dressing/bathroom;Assistance with cooking/housework;Assist for transportation;Help with stairs or ramp for entrance      Equipment Recommendations  None recommended by PT    Recommendations for Other Services       Precautions / Restrictions Precautions Precautions: Fall;Sternal;Other (comment) Precaution Booklet Issued: No Precaution Comments: watch BP, pt also reports some hx  vertigo Restrictions Weight Bearing Restrictions: Yes (not an order but in a MD note) RUE Weight Bearing: Touch down weight bearing LUE Weight Bearing: Touch down weight bearing Other Position/Activity Restrictions: sternal prec     Mobility  Bed Mobility Overal bed mobility: Modified Independent Bed Mobility: Supine to Sit   Sidelying to sit: Min guard, HOB elevated Supine to sit: Modified independent (Device/Increase time)          Transfers Overall transfer level: Independent Equipment used: None Transfers: Sit to/from Stand Sit to Stand: Independent   Step pivot transfers: Supervision       General transfer comment: Maintained sternal precautions    Ambulation/Gait Ambulation/Gait assistance: Supervision Gait Distance (Feet): 175 Feet Assistive device: None Gait Pattern/deviations: Step-through pattern, Decreased stride length Gait velocity: decreased Gait velocity interpretation: 1.31 - 2.62 ft/sec, indicative of limited community ambulator   General Gait Details: Distance limited by dizziness today requiring 4 standing rest breaks in the hallway.  Encouraged purse lip breathing.  Pulse ox  O2 up to 93% with pursed lip breathing.   Stairs             Wheelchair Mobility     Tilt Bed    Modified Rankin (Stroke Patients Only)       Balance Overall balance assessment: Mild deficits observed, not formally tested Sitting-balance support: Feet supported Sitting balance-Leahy Scale: Good     Standing balance support: No upper extremity supported Standing balance-Leahy Scale: Good Standing balance comment: no AD, decreased activity tolerance, hx of dizziness with ambulation  Cognition Arousal/Alertness: Awake/alert Behavior During Therapy: WFL for tasks assessed/performed Overall Cognitive Status: Within Functional Limits for tasks assessed                                           Exercises Other Exercises Other Exercises: Discussed some energy conservation techniques and gave handout Other Exercises: Pt educated and given handouts for [employer], self Epley, rolling for habituation as well as x 1 exercises.  Pt educatedin when to perform which exercises.    General Comments General comments (skin integrity, edema, etc.): Tested for BPPV with left hypofunction noted.  Treated for left posterior canal BPPV.  Educated pt regarding BPPV as well as left hypofunction.      Pertinent Vitals/Pain Pain Assessment Pain Assessment: No/denies pain    Home Living                          Prior Function            PT Goals (current goals can now be found in the care plan section) Acute Rehab PT Goals Patient Stated Goal: home Progress towards PT goals: Progressing toward goals    Frequency    Min 1X/week      PT Plan Current plan remains appropriate    Co-evaluation              AM-PAC PT "6 Clicks" Mobility   Outcome Measure  Help needed turning from your back to your side while in a flat bed without using bedrails?: None Help needed moving from lying on your back to sitting on the side of a flat bed without using bedrails?: None Help needed moving to and from a bed to a chair (including a wheelchair)?: None Help needed standing up from a chair using your arms (e.g., wheelchair or bedside chair)?: None Help needed to walk in hospital room?: A Little Help needed climbing 3-5 steps with a railing? : A Little 6 Click Score: 22    End of Session Equipment Utilized During Treatment: Gait belt Activity Tolerance: Patient limited by fatigue Patient left: with call bell/phone within reach;in bed Nurse Communication: Mobility status PT Visit Diagnosis: Other abnormalities of gait and mobility (R26.89);Muscle weakness (generalized) (M62.81);Pain;Dizziness and giddiness (R42) Pain - part of body:  (chest)     Time: 1610-9604 PT Time  Calculation (min) (ACUTE ONLY): 36 min  Charges:    $Gait Training: 8-22 mins $Therapeutic Exercise: 8-22 mins PT General Charges $$ ACUTE PT VISIT: 1 Visit                     Ferdinando Lodge M,PT Acute Rehab Services [phone]    Bevelyn Buckles 11/21/2022, 3:16 PM

## 2022-11-21 NOTE — Progress Notes (Signed)
   11/21/22 2040  BiPAP/CPAP/SIPAP  $ Non-Invasive Ventilator  Non-Invasive Vent Subsequent (pt places self on CPAP)  IPAP 20 cmH20 (auto)  EPAP 5 cmH2O (auto)  FiO2 (%) 21 %  BiPAP/CPAP /SiPAP Vitals  SpO2 98 %  Bilateral Breath Sounds Clear;Diminished

## 2022-11-21 NOTE — Progress Notes (Signed)
Brett Marshall  YNW:[id number] DOB: 05-Jun-1965 DOA: 11/11/2022 PCP: Ethelda Chick, MD    Brief Narrative:  57 year old with a history of morbid obesity, CAD, DM 2, CHF, HLD, and pulmonary hypertension who underwent an aortic valve replacement 10/30/2022 for treatment of aortic stenosis.  Though his procedure went well he has had difficulty recovering at home postoperatively with progressive decline in his strength and functional abilities with significant persisting dyspnea.  Goals of Care:  Code Status: Full Code   DVT prophylaxis: Lovenox  Interim Hx: No acute events recorded overnight.  Afebrile.  Mild sinus tachycardia has recurred after washout of previously dosed metoprolol.  CBGs moderately elevated.  Reports some low-grade intermittent anxiety.  Short of breath with exertion without significant change.  Was able to walk further today when working with therapy than yesterday.  Assessment & Plan:  Persistent dyspnea - generalized weakness Procalcitonin, physical exam, and CXR not consistent with pneumonia/pulmonary infection - CTa without evidence of pulmonary embolism - no evidence of significant pulmonary parenchymal disease or pleural effusion - no evidence of pericardial effusion - TTE suggests preserved cardiac function and stable valve function - no evidence of significant thyroid dysfunction - adrenal axis appears intact - B12 is deficient and is being replaced - otherwise no glaring etiology for the patient's state apart from significant generalized deconditioning - continue PT/OT  Reactive airway disease Care per Pulmonary with no other acute interventions/therapies presently indicated   Sinus tachycardia TSH elevated at 7.6 but T3 and free T4 within normal limits/low normal - no evidence of PE on CTa at admission - cortisol level normal at presentation - recheck cortisol 7/4 normal again - no DVT on B LE venous duplex - responded well to beta-blocker but patient was not able  to tolerate due to symptomatic modest hypotension -retry beta-blocker at lower dose today  B12 deficiency B12 below clinical goal at 212 -supplement - folic acid level was normal -subcu replacement being administered during hospital with plan to transition to oral at time of discharge -will need recheck of B12 level in outpatient setting in 6-8 weeks  Iron deficiency Iron studies suggest low iron as well as low TIBC likely related to poor nutritional status -ferritin 69 therefore not severely depressed -oral iron therapy ongoing  Vitamin D deficiency Vitamin D markedly low at 8.02 - initiated supplementation during this hospital stay  Acute kidney injury Creatinine appears stable - follow trend  CAD status post PCI 2023 No symptoms to suggest acute coronary syndrome or anginal equivalent  Bicuspid aortic valve/aortic stenosis status post surgical replacement 10/30/2022 Valve appears to be functioning normally on follow-up TTE  Normocytic anemia Monitor hemoglobin trend -not yet below transfusion threshold and in fact appears to be improving at present  DM2 CBG elevated above goal at 200-225 -adjust insulin therapy and follow  Morbid obesity - Body mass index is 49.41 kg/m.   Family Communication: Discussed care with patient and his wife at bedside Disposition: Patient desires discharge home with home health PT/OT rather than considering a rehab stay   Objective: Blood pressure 128/82, pulse (!) 107, temperature 97.9 F (36.6 C), temperature source Oral, resp. rate 18, height 5\' 8"  (1.727 m), weight (!) 147.4 kg, SpO2 99 %.  Intake/Output Summary (Last 24 hours) at 11/21/2022 1713 Last data filed at 11/21/2022 0700 Gross per 24 hour  Intake 300 ml  Output --  Net 300 ml    Filed Weights   11/18/22 0500 11/19/22 0424 11/20/22 0409  Weight: (!) 146.1 kg (!) 143.2 kg (!) 147.4 kg    Examination: General: No acute respiratory distress Lungs: Clear to auscultation  bilaterally with no wheezing Cardiovascular: Tachycardic but regular with no murmur or rub Abdomen: Nontender, nondistended, soft, bowel sounds positive, no rebound Extremities: 2+ bilateral lower extremity edema which is stable  CBC: Recent Labs  Lab 11/15/22 0020 11/16/22 0356 11/17/22 0132 11/18/22 0648 11/19/22 0229 11/20/22 0230  WBC 10.2 11.2*   < > 8.4 9.1 7.3  NEUTROABS 7.4 8.0*  --  5.7  --   --   HGB 7.7* 8.1*   < > 7.9* 8.1* 8.0*  HCT 25.8* 27.1*   < > 26.4* 26.9* 26.5*  MCV 93.1 92.8   < > 87.4 89.4 88.9  PLT 489* 508*   < > 504* 380 467*   < > = values in this interval not displayed.    Basic Metabolic Panel: Recent Labs  Lab 11/15/22 0020 11/16/22 0356 11/17/22 0132 11/18/22 0648 11/19/22 0229 11/20/22 0230  NA 139 133* 135 135 132* 134*  K 4.6 4.5 4.0 3.9 3.9 3.9  CL 102 99 94* 96* 94* 97*  CO2 28 26 27 30 28 29   GLUCOSE 189* 204* 200* 200* 191* 205*  BUN 11 12 14 15 17 18   CREATININE 1.38* 1.23 1.21 1.15 1.32* 1.28*  CALCIUM 8.7* 8.5* 8.9 8.4* 8.4* 8.7*  MG 2.0 1.9 2.1 2.0 2.1  --   PHOS 3.3 3.1  --  3.5  --   --     GFR: Estimated Creatinine Clearance: 90.1 mL/min (A) (by C-G formula based on SCr of 1.28 mg/dL (H)).   Scheduled Meds:  arformoterol  15 mcg Nebulization BID   aspirin EC  325 mg Oral Daily   atorvastatin  40 mg Oral q1800   budesonide (PULMICORT) nebulizer solution  0.25 mg Nebulization BID   cholecalciferol  1,000 Units Oral Daily   cyanocobalamin  1,000 mcg Subcutaneous Daily   DULoxetine  120 mg Oral QHS   enoxaparin (LOVENOX) injection  40 mg Subcutaneous Q24H   famotidine  40 mg Oral QHS   gabapentin  300 mg Oral QHS   insulin aspart  0-20 Units Subcutaneous TID WC   insulin aspart  0-5 Units Subcutaneous QHS   insulin detemir  20 Units Subcutaneous Daily   iron polysaccharides  150 mg Oral Daily   metoprolol tartrate  12.5 mg Oral BID   multivitamin with minerals  1 tablet Oral Daily     LOS: 7 days   Lonia Blood, MD [facility] Office  [phone] Pager - Text Page per Loretha Stapler  If 7PM-7AM, please contact night-coverage per Amion 11/21/2022, 5:13 PM

## 2022-11-22 DIAGNOSIS — R627 Adult failure to thrive: Secondary | ICD-10-CM | POA: Diagnosis not present

## 2022-11-22 LAB — COMPREHENSIVE METABOLIC PANEL
ALT: 19 U/L (ref 0–44)
AST: 23 U/L (ref 15–41)
Albumin: 2.5 g/dL — ABNORMAL LOW (ref 3.5–5.0)
Alkaline Phosphatase: 103 U/L (ref 38–126)
Anion gap: 9 (ref 5–15)
BUN: 17 mg/dL (ref 6–20)
CO2: 27 mmol/L (ref 22–32)
Calcium: 8.9 mg/dL (ref 8.9–10.3)
Chloride: 99 mmol/L (ref 98–111)
Creatinine, Ser: 1.19 mg/dL (ref 0.61–1.24)
GFR, Estimated: >60 mL/min (ref >60)
Glucose, Bld: 235 mg/dL — ABNORMAL HIGH (ref 70–99)
Potassium: 4.2 mmol/L (ref 3.5–5.1)
Sodium: 135 mmol/L (ref 135–145)
Total Bilirubin: 0.5 mg/dL (ref 0.3–1.2)
Total Protein: 6.7 g/dL (ref 6.5–8.1)

## 2022-11-22 LAB — CBC
HCT: 27.6 % — ABNORMAL LOW (ref 39.0–52.0)
Hemoglobin: 8.2 g/dL — ABNORMAL LOW (ref 13.0–17.0)
MCH: 26.2 pg (ref 26.0–34.0)
MCHC: 29.7 g/dL — ABNORMAL LOW (ref 30.0–36.0)
MCV: 88.2 fL (ref 80.0–100.0)
Platelets: 436 10*3/uL — ABNORMAL HIGH (ref 150–400)
RBC: 3.13 MIL/uL — ABNORMAL LOW (ref 4.22–5.81)
RDW: 15.8 % — ABNORMAL HIGH (ref 11.5–15.5)
WBC: 8.2 10*3/uL (ref 4.0–10.5)
nRBC: 0 % (ref 0.0–0.2)

## 2022-11-22 LAB — GLUCOSE, CAPILLARY: Glucose-Capillary: 291 mg/dL — ABNORMAL HIGH (ref 70–99)

## 2022-11-22 LAB — MAGNESIUM: Magnesium: 2 mg/dL (ref 1.7–2.4)

## 2022-11-22 LAB — PHOSPHORUS: Phosphorus: 3.4 mg/dL (ref 2.5–4.6)

## 2022-11-22 MED ORDER — VITAMIN D3 25 MCG PO TABS: 1000.0000 [IU] | ORAL_TABLET | Freq: Every day | ORAL | 0 refills | Status: DC

## 2022-11-22 MED ORDER — METFORMIN HCL 500 MG PO TABS: 1000.0000 mg | ORAL_TABLET | Freq: Two times a day (BID) | ORAL | Status: DC

## 2022-11-22 MED ORDER — METOPROLOL TARTRATE 25 MG PO TABS: 12.5000 mg | ORAL_TABLET | Freq: Two times a day (BID) | ORAL | 2 refills | Status: DC

## 2022-11-22 MED ORDER — VITAMIN B-12 1000 MCG PO TABS: 1000.0000 ug | ORAL_TABLET | Freq: Every day | ORAL | Status: DC

## 2022-11-22 MED ORDER — EMPAGLIFLOZIN 25 MG PO TABS
25.0000 mg | ORAL_TABLET | Freq: Every day | ORAL | Status: DC
  Filled 2022-11-22: qty 1

## 2022-11-22 MED ORDER — TRAMADOL HCL 50 MG PO TABS: 50.0000 mg | ORAL_TABLET | Freq: Four times a day (QID) | ORAL | Status: DC | PRN

## 2022-11-22 NOTE — TOC Transition Note (Signed)
Transition of Care [facility]) - CM/SW Discharge Note   Patient Details  Name: Brett Marshall MRN: [id number] Date of Birth: 24-Sep-1965  Transition of Care [facility]) CM/SW Contact:  Ronny Bacon, RN Phone Number: 11/22/2022, 1:13 PM   Clinical Narrative:   Patient is being discharged home today. [facility] confirmed that patient has been accepted under their service for [facility] PT/OT.    Final next level of care: Home w Home Health Services Barriers to Discharge: No Barriers Identified   Patient Goals and CMS Choice CMS Medicare.gov Compare Post Acute Care list provided to:: Patient Choice offered to / list presented to : Patient  Discharge Placement                         Discharge Plan and Services Additional resources added to the After Visit Summary for     Discharge Planning Services: CM Consult Post Acute Care Choice: Home Health          DME Arranged: N/A         HH Arranged: PT, OT HH Agency: [facility] Date [facility] Agency Contacted: 11/12/22 Time HH Agency Contacted: 1623 Representative spoke with at [facility] Agency: Tresa Endo  Social Determinants of Health (SDOH) Interventions SDOH Screenings   Food Insecurity: No Food Insecurity (11/03/2022)  Housing: Patient Declined (11/03/2022)  Transportation Needs: Patient Declined (11/03/2022)  Utilities: Not At Risk (11/03/2022)  Depression (PHQ2-9): Low Risk  (12/29/2021)  Recent Concern: Depression (PHQ2-9) - Medium Risk (12/08/2021)  Tobacco Use: Low Risk  (11/11/2022)     Readmission Risk Interventions    11/05/2022   11:52 AM  Readmission Risk Prevention Plan  Transportation Screening Complete  PCP or Specialist Appt within 3-5 Days Complete  HRI or Home Care Consult Complete  Social Work Consult for Recovery Care Planning/Counseling Complete  Palliative Care Screening Not Applicable  Medication Review [employer]) Complete

## 2022-11-22 NOTE — Discharge Summary (Signed)
DISCHARGE SUMMARY  Brett Marshall  MR#: [id number]  DOB:31-Oct-1965  Date of Admission: 11/11/2022 Date of Discharge: 11/22/2022  Attending Physician:Keilyn Haggard Silvestre Gunner, MD  Patient's WUJ:WJXBJ, Myrle Sheng, MD  Consults: PCCM TCTS  Disposition: Discharge home  Follow-up Appts:  Follow-up Information     [facility] Follow up.   Specialty: [facility] Contact information: [address] [place] [address] [phone]         Ethelda Chick, MD Follow up.   Specialty: Family Medicine Contact information: [address] [place] [address] [phone]                 Tests Needing Follow-up: -will need recheck of B12 level in outpatient setting in 6-8 weeks -recheck Fe levels in 6-8 weeks on replacement tx -recheck Vitamin D levels after 6-8 weeks on replacement   Discharge Diagnoses: Persistent dyspnea - physical deconditioning - generalized weakness Reactive airway disease Sinus tachycardia B12 deficiency Iron deficiency Vitamin D deficiency Acute kidney injury CAD status post PCI 2023 Bicuspid aortic valve/aortic stenosis status post surgical replacement 10/30/2022 Normocytic anemia DM2 Morbid obesity - Body mass index is 49.28 kg/m.   Initial presentation: 57 year old with a history of morbid obesity, CAD, DM 2, CHF, HLD, and pulmonary hypertension who underwent an aortic valve replacement 10/30/2022 for treatment of aortic stenosis. Though his procedure went well he has had difficulty recovering at home postoperatively with progressive decline in his strength and functional abilities with significant persisting dyspnea.   Hospital Course:  Persistent dyspnea - generalized weakness Procalcitonin, physical exam, and CXR not consistent with pneumonia/pulmonary infection - CTa without evidence of pulmonary embolism - no evidence of significant pulmonary parenchymal disease or pleural effusion - no evidence  of pericardial effusion - TTE suggests preserved cardiac function and stable valve function - no evidence of significant thyroid dysfunction - adrenal axis appears intact - B12 is deficient and is being replaced - otherwise no glaring etiology for the patient's state apart from significant generalized deconditioning - continue PT/OT -patient made slow progress during hospital stay and was stable at time of discharge after serial physical therapy and Occupational Therapy evaluations and progressive improvement in exertional tolerance   Reactive airway disease Care during hospital stay provided per Pulmonary with no other acute interventions/therapies indicated at time of discharge   Sinus tachycardia TSH elevated at 7.6 but T3 and free T4 within normal limits/low normal - no evidence of PE on CTa at admission - cortisol level normal at presentation - recheck cortisol 7/4 normal again - no DVT on B LE venous duplex - responded well to prior dose of beta-blocker but patient was not able to tolerate due to symptomatic modest hypotension -retry beta-blocker at lower dose was successful and patient was discharged home on new lower dose with heart rate consistently in the 90s   B12 deficiency B12 below clinical goal at 212 -supplement - folic acid level was normal -subcu replacement administered during hospital with plan to transition to oral at time of discharge -will need recheck of B12 level in outpatient setting in 6-8 weeks   Iron deficiency Iron studies suggest low iron as well as low TIBC likely related to poor nutritional status -ferritin 69 therefore not severely depressed -oral iron therapy ongoing   Vitamin D deficiency Vitamin D markedly low at 8.02 - initiated supplementation during this hospital stay   Acute kidney injury Creatinine steadily improved during latter portion of hospital stay and  is stable at discharge   CAD status post PCI 2023 No symptoms to suggest acute coronary syndrome  or anginal equivalent   Bicuspid aortic valve/aortic stenosis status post surgical replacement 10/30/2022 Valve appears to be functioning normally on follow-up TTE   Normocytic anemia Monitored hemoglobin trend -did not fall below transfusion threshold and remained stable at time of discharge   DM2 CBG improved with adjustment in medical therapy   Morbid obesity - Body mass index is 49.28 kg/m.    Allergies as of 11/22/2022   No Known Allergies      Medication List     STOP taking these medications    furosemide 40 MG tablet Commonly known as: LASIX   guaiFENesin 600 MG 12 hr tablet Commonly known as: MUCINEX   oxyCODONE 5 MG immediate release tablet Commonly known as: Oxy IR/ROXICODONE   potassium chloride SA 20 MEQ tablet Commonly known as: KLOR-CON M       TAKE these medications    acetaminophen 325 MG tablet Commonly known as: TYLENOL Take 650 mg by mouth every 6 (six) hours as needed for moderate pain.   albuterol 108 (90 Base) MCG/ACT inhaler Commonly known as: VENTOLIN HFA Inhale 1-2 puffs into the lungs every 6 (six) hours as needed for wheezing or shortness of breath.   aspirin EC 325 MG tablet Take 1 tablet (325 mg total) by mouth daily.   atorvastatin 40 MG tablet Commonly known as: LIPITOR Take 1 tablet (40 mg total) by mouth daily at 6 PM.   cyanocobalamin 1000 MCG tablet Commonly known as: VITAMIN B12 Take 1 tablet (1,000 mcg total) by mouth daily.   cyclobenzaprine 10 MG tablet Commonly known as: FLEXERIL Take 1 tablet (10 mg total) by mouth 3 (three) times daily as needed.For spasms What changed:  how much to take when to take this reasons to take this   DULoxetine 60 MG capsule Commonly known as: CYMBALTA Take 120 mg by mouth at bedtime.   famotidine 40 MG tablet Commonly known as: PEPCID Take 40 mg by mouth at bedtime.   ferrous sulfate 325 (65 FE) MG EC tablet Take 1 tablet (325 mg total) by mouth daily with breakfast.    fluticasone 50 MCG/ACT nasal spray Commonly known as: FLONASE Place 1 spray into both nostrils daily as needed for allergies or rhinitis.   fluticasone-salmeterol 250-50 MCG/ACT Aepb Commonly known as: ADVAIR Inhale 1 puff into the lungs every 12 (twelve) hours.   gabapentin 300 MG capsule Commonly known as: NEURONTIN Take 300 mg by mouth at bedtime.   glucose blood test strip Commonly known as: GE100 Blood Glucose Test Use as instructed   insulin glargine 100 unit/mL Sopn Commonly known as: LANTUS Inject 30 Units into the skin daily.   Jardiance 25 MG Tabs tablet Generic drug: empagliflozin Take 25 mg by mouth daily.   lidocaine 5 % Commonly known as: LIDODERM Place 1 patch onto the skin daily. Remove & Discard patch within 12 hours or as directed by MD. May place on right or left side of chest What changed:  when to take this reasons to take this   metFORMIN 1000 MG tablet Commonly known as: GLUCOPHAGE Take 1 tablet (1,000 mg total) by mouth 2 (two) times daily with a meal.   metoprolol tartrate 25 MG tablet Commonly known as: LOPRESSOR Take 0.5 tablets (12.5 mg total) by mouth 2 (two) times daily. What changed: how much to take   onetouch ultrasoft lancets Check sugar twice daily  traZODone 50 MG tablet Commonly known as: DESYREL Take 50 mg by mouth at bedtime.   vitamin D3 25 MCG tablet Commonly known as: CHOLECALCIFEROL Take 1 tablet (1,000 Units total) by mouth daily. Start taking on: November 23, 2022        Day of Discharge BP 114/69 (BP Location: Left Arm)   Pulse 85   Temp 98.1 F (36.7 C) (Oral)   Resp 17   Ht 5\' 8"  (1.727 m)   Wt (!) 147 kg   SpO2 96%   BMI 49.28 kg/m   Physical Exam: General: No acute respiratory distress Lungs: Clear to auscultation bilaterally without wheezes or crackles Cardiovascular: Regular rate and rhythm without murmur gallop or rub normal S1 and S2 Abdomen: Nontender, nondistended, soft, bowel sounds  positive, no rebound, no ascites, no appreciable mass Extremities: No significant cyanosis, clubbing, or edema bilateral lower extremities  Basic Metabolic Panel: Recent Labs  Lab 11/16/22 0356 11/17/22 0132 11/18/22 0648 11/19/22 0229 11/20/22 0230 11/22/22 0309  NA 133* 135 135 132* 134* 135  K 4.5 4.0 3.9 3.9 3.9 4.2  CL 99 94* 96* 94* 97* 99  CO2 26 27 30 28 29 27   GLUCOSE 204* 200* 200* 191* 205* 235*  BUN 12 14 15 17 18 17   CREATININE 1.23 1.21 1.15 1.32* 1.28* 1.19  CALCIUM 8.5* 8.9 8.4* 8.4* 8.7* 8.9  MG 1.9 2.1 2.0 2.1  --  2.0  PHOS 3.1  --  3.5  --   --  3.4   CBC: Recent Labs  Lab 11/16/22 0356 11/17/22 0132 11/18/22 0648 11/19/22 0229 11/20/22 0230 11/22/22 0309  WBC 11.2* 12.7* 8.4 9.1 7.3 8.2  NEUTROABS 8.0*  --  5.7  --   --   --   HGB 8.1* 8.9* 7.9* 8.1* 8.0* 8.2*  HCT 27.1* 29.5* 26.4* 26.9* 26.5* 27.6*  MCV 92.8 89.4 87.4 89.4 88.9 88.2  PLT 508* 648* 504* 380 467* 436*    Time spent in discharge (includes decision making & examination of pt): 35 minutes  11/22/2022, 12:04 PM   Lonia Blood, MD [facility] Office  [phone]

## 2022-11-25 DIAGNOSIS — M5136 Other intervertebral disc degeneration, lumbar region: Secondary | ICD-10-CM | POA: Diagnosis not present

## 2022-11-25 DIAGNOSIS — E1142 Type 2 diabetes mellitus with diabetic polyneuropathy: Secondary | ICD-10-CM | POA: Diagnosis not present

## 2022-11-25 DIAGNOSIS — J4520 Mild intermittent asthma, uncomplicated: Secondary | ICD-10-CM | POA: Diagnosis not present

## 2022-11-25 DIAGNOSIS — I421 Obstructive hypertrophic cardiomyopathy: Secondary | ICD-10-CM | POA: Diagnosis not present

## 2022-11-25 DIAGNOSIS — I5042 Chronic combined systolic (congestive) and diastolic (congestive) heart failure: Secondary | ICD-10-CM | POA: Diagnosis not present

## 2022-11-25 DIAGNOSIS — I272 Pulmonary hypertension, unspecified: Secondary | ICD-10-CM | POA: Diagnosis not present

## 2022-11-25 DIAGNOSIS — Z48812 Encounter for surgical aftercare following surgery on the circulatory system: Secondary | ICD-10-CM | POA: Diagnosis not present

## 2022-11-25 DIAGNOSIS — G4700 Insomnia, unspecified: Secondary | ICD-10-CM | POA: Diagnosis not present

## 2022-11-25 DIAGNOSIS — I2510 Atherosclerotic heart disease of native coronary artery without angina pectoris: Secondary | ICD-10-CM | POA: Diagnosis not present

## 2022-11-25 DIAGNOSIS — N179 Acute kidney failure, unspecified: Secondary | ICD-10-CM | POA: Diagnosis not present

## 2022-11-25 DIAGNOSIS — M4810 Ankylosing hyperostosis [Forestier], site unspecified: Secondary | ICD-10-CM | POA: Diagnosis not present

## 2022-11-25 DIAGNOSIS — G8929 Other chronic pain: Secondary | ICD-10-CM | POA: Diagnosis not present

## 2022-11-25 DIAGNOSIS — G4733 Obstructive sleep apnea (adult) (pediatric): Secondary | ICD-10-CM | POA: Diagnosis not present

## 2022-11-25 DIAGNOSIS — D649 Anemia, unspecified: Secondary | ICD-10-CM | POA: Diagnosis not present

## 2022-11-25 DIAGNOSIS — I110 Hypertensive heart disease with heart failure: Secondary | ICD-10-CM | POA: Diagnosis not present

## 2022-11-26 ENCOUNTER — Ambulatory Visit: Payer: BLUE CROSS/BLUE SHIELD | Attending: Nurse Practitioner | Admitting: Nurse Practitioner

## 2022-11-26 ENCOUNTER — Ambulatory Visit
Admission: RE | Admit: 2022-11-26 | Discharge: 2022-11-26 | Disposition: A | Discharge status: Home / Self Care | Payer: BLUE CROSS/BLUE SHIELD | Source: Ambulatory Visit | Attending: Nurse Practitioner | Admitting: Nurse Practitioner

## 2022-11-26 ENCOUNTER — Encounter: Payer: Self-pay | Admitting: Nurse Practitioner

## 2022-11-26 VITALS — BP 110/84 | HR 99 | Ht 68.0 in | Wt 318.6 lb

## 2022-11-26 DIAGNOSIS — I517 Cardiomegaly: Secondary | ICD-10-CM | POA: Diagnosis not present

## 2022-11-26 DIAGNOSIS — I421 Obstructive hypertrophic cardiomyopathy: Secondary | ICD-10-CM | POA: Diagnosis not present

## 2022-11-26 DIAGNOSIS — J4520 Mild intermittent asthma, uncomplicated: Secondary | ICD-10-CM | POA: Diagnosis not present

## 2022-11-26 DIAGNOSIS — Z48812 Encounter for surgical aftercare following surgery on the circulatory system: Secondary | ICD-10-CM | POA: Diagnosis not present

## 2022-11-26 DIAGNOSIS — I5042 Chronic combined systolic (congestive) and diastolic (congestive) heart failure: Secondary | ICD-10-CM | POA: Diagnosis not present

## 2022-11-26 DIAGNOSIS — R0600 Dyspnea, unspecified: Secondary | ICD-10-CM | POA: Insufficient documentation

## 2022-11-26 DIAGNOSIS — I272 Pulmonary hypertension, unspecified: Secondary | ICD-10-CM | POA: Diagnosis not present

## 2022-11-26 DIAGNOSIS — I110 Hypertensive heart disease with heart failure: Secondary | ICD-10-CM | POA: Diagnosis not present

## 2022-11-26 DIAGNOSIS — Z952 Presence of prosthetic heart valve: Secondary | ICD-10-CM

## 2022-11-26 DIAGNOSIS — D649 Anemia, unspecified: Secondary | ICD-10-CM | POA: Diagnosis not present

## 2022-11-26 DIAGNOSIS — E782 Mixed hyperlipidemia: Secondary | ICD-10-CM

## 2022-11-26 DIAGNOSIS — I10 Essential (primary) hypertension: Secondary | ICD-10-CM | POA: Diagnosis not present

## 2022-11-26 DIAGNOSIS — I2510 Atherosclerotic heart disease of native coronary artery without angina pectoris: Secondary | ICD-10-CM

## 2022-11-26 DIAGNOSIS — N179 Acute kidney failure, unspecified: Secondary | ICD-10-CM | POA: Diagnosis not present

## 2022-11-26 DIAGNOSIS — R918 Other nonspecific abnormal finding of lung field: Secondary | ICD-10-CM | POA: Diagnosis not present

## 2022-11-26 DIAGNOSIS — G4733 Obstructive sleep apnea (adult) (pediatric): Secondary | ICD-10-CM | POA: Diagnosis not present

## 2022-11-26 DIAGNOSIS — I350 Nonrheumatic aortic (valve) stenosis: Secondary | ICD-10-CM

## 2022-11-26 DIAGNOSIS — E119 Type 2 diabetes mellitus without complications: Secondary | ICD-10-CM

## 2022-11-26 DIAGNOSIS — E1142 Type 2 diabetes mellitus with diabetic polyneuropathy: Secondary | ICD-10-CM | POA: Diagnosis not present

## 2022-11-26 DIAGNOSIS — M5136 Other intervertebral disc degeneration, lumbar region: Secondary | ICD-10-CM | POA: Diagnosis not present

## 2022-11-26 DIAGNOSIS — R079 Chest pain, unspecified: Secondary | ICD-10-CM | POA: Diagnosis not present

## 2022-11-26 DIAGNOSIS — Z7984 Long term (current) use of oral hypoglycemic drugs: Secondary | ICD-10-CM

## 2022-11-26 DIAGNOSIS — R0602 Shortness of breath: Secondary | ICD-10-CM | POA: Diagnosis not present

## 2022-11-26 DIAGNOSIS — M4810 Ankylosing hyperostosis [Forestier], site unspecified: Secondary | ICD-10-CM | POA: Diagnosis not present

## 2022-11-26 DIAGNOSIS — G4700 Insomnia, unspecified: Secondary | ICD-10-CM | POA: Diagnosis not present

## 2022-11-26 DIAGNOSIS — G8929 Other chronic pain: Secondary | ICD-10-CM | POA: Diagnosis not present

## 2022-11-26 NOTE — Progress Notes (Signed)
Deconditioned requiring home PT/OT.  Will require TCT has approval for rehab in the future.

## 2022-11-26 NOTE — Progress Notes (Signed)
Office Visit    Patient Name: Brett Marshall Date of Encounter: 11/26/2022  Primary Care Provider:  Ethelda Chick, MD Primary Cardiologist:  Lorine Bears, MD  Chief Complaint    57 y.o. 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  Past Medical History    Past Medical History:  Diagnosis Date   Asthma    CAD (coronary artery disease)    a. 08/2017 Cath: Diff nonobs dzs, EF 45-50%, diff HK; b. 08/2021 MV: Ant/apical ishemia; c. 08/2021 PCI: LAD 95ost (4.0x15 Onyx Frontier DES), EF 55-65%; c. 09/2021 Cath: LM nl, LAD patent stent, 35m, 30d, RI min irregs, LCX min irregs, RCA 50p. LVOT grad 50-65mmHg (rest), 70-186mmHg (provoked); d. 10/2022 Cath: LM nl, LAD 27m, 60d, RI min irregs, LCX min irregs, RCA 50p.   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 08/2017 LV gram: EF 45-50%, glob HK; b. 08/2021 Echo: EF 60-65%, no rwma, GrI DD, nl RV fxn; c. 09/2021 cMRI: EF 60%, no LVOT obstruction/gradient. Mild conc LVH, mild AS. No myocardial scar/fibrosis. No evidence of HCM.   Hyperlipidemia    Leg swelling    Left   Low back pain    Mixed ICM & NICM    a. 08/2017 LV gram: EF 45-50%, glob HK; b. 08/2021 Echo: EF 60-65%.   Morbid obesity (HCC)    OSA on CPAP    non compliant   Pulmonary hypertension (HCC)    Severe aortic stenosis    a. 10/2022 Cath: 31.60mmHg gradient and AVA 1.05cm^2; b. 10/2022 s/p 23mm Edwards Resilia bioprosthetic AoV, resection of sub-aortic membrane,  and septal myectomy; c. 10/2022 Echo: EF 65-70%, no rwma, nl AoV structure/fxn.   Past Surgical History:  Procedure Laterality Date   AORTIC ROOT ENLARGEMENT N/A 10/30/2022   Procedure: AORTIC ROOT ENLARGEMENT AND SUBAORTIC MYOMECTOMY;  Surgeon: Eugenio Hoes, MD;  Location: [facility] OR;  Service: Open Heart Surgery;  Laterality: N/A;   AORTIC VALVE REPLACEMENT N/A 10/30/2022   Procedure: AORTIC VALVE REPLACEMENT (AVR) USING INSPIRIS RESILIA AORTIC VALVE;  Surgeon: Eugenio Hoes, MD;  Location: [facility] OR;  Service: Open Heart Surgery;  Laterality: N/A;  Median sternotomy   CARDIAC CATHETERIZATION     Starkweather no stents.   COLONOSCOPY WITH PROPOFOL N/A 04/11/2019   Procedure: COLONOSCOPY WITH PROPOFOL;  Surgeon: Midge Minium, MD;  Location: [facility] ENDOSCOPY;  Service: Endoscopy;  Laterality: N/A;   CORONARY STENT INTERVENTION N/A 09/08/2021   Procedure: CORONARY STENT INTERVENTION;  Surgeon: [name], MD;  Location: [facility] INVASIVE CV LAB;  Service: Cardiovascular;  Laterality: N/A;   CORONARY ULTRASOUND/IVUS N/A 09/08/2021   Procedure: Intravascular Ultrasound/IVUS;  Surgeon: [name], MD;  Location: [facility] INVASIVE CV LAB;  Service: Cardiovascular;  Laterality: N/A;   LEFT HEART CATH AND CORONARY ANGIOGRAPHY N/A 09/08/2021   Procedure: LEFT HEART CATH AND CORONARY ANGIOGRAPHY;  Surgeon: [name], MD;  Location: [facility] INVASIVE CV LAB;  Service: Cardiovascular;  Laterality: N/A;   LEFT HEART  CATH AND CORONARY ANGIOGRAPHY N/A 09/17/2021   Procedure: LEFT HEART CATH AND CORONARY ANGIOGRAPHY;  Surgeon: Yvonne Kendall, MD;  Location: [facility] INVASIVE CV LAB;  Service: Cardiovascular;  Laterality: N/A;   LEFT HEART CATH AND CORONARY ANGIOGRAPHY N/A 09/08/2022   Procedure: LEFT HEART CATH AND CORONARY ANGIOGRAPHY;  Surgeon: Yvonne Kendall, MD;  Location: [facility] INVASIVE CV LAB;  Service: Cardiovascular;  Laterality: N/A;   RIGHT/LEFT HEART CATH AND CORONARY ANGIOGRAPHY N/A 09/02/2017    Procedure: RIGHT/LEFT HEART CATH AND CORONARY ANGIOGRAPHY;  Surgeon: [name], MD;  Location: [facility] INVASIVE CV LAB;  Service: Cardiovascular;  Laterality: N/A;   RIGHT/LEFT HEART CATH AND CORONARY ANGIOGRAPHY Bilateral 10/19/2022   Procedure: RIGHT/LEFT HEART CATH AND CORONARY ANGIOGRAPHY;  Surgeon: [name], MD;  Location: [facility] INVASIVE CV LAB;  Service: Cardiovascular;  Laterality: Bilateral;   TEE WITHOUT CARDIOVERSION N/A 09/10/2022   Procedure: TRANSESOPHAGEAL ECHOCARDIOGRAM;  Surgeon: Antonieta Iba, MD;  Location: [facility] ORS;  Service: Cardiovascular;  Laterality: N/A;   TEE WITHOUT CARDIOVERSION N/A 10/30/2022   Procedure: TRANSESOPHAGEAL ECHOCARDIOGRAM;  Surgeon: Eugenio Hoes, MD;  Location: [facility] OR;  Service: Open Heart Surgery;  Laterality: N/A;    Allergies  No Known Allergies  History of Present Illness      57 y.o. 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  He previous underwent diagnostic catheterization April 2019 which showed mild, nonobstructive CAD with an EF of 45 to 50%.  He had mildly elevated filling pressures and has since been treated for sleep apnea.  In April 2023, he was admitted to [facility] with a 10-day history of chest pain which was somewhat reproducible palpation and exertion.  Troponins were normal.  Stress test showed anterior and apical ischemia.  Diagnostic catheterization showed 95% stenosis of the ostial LAD and otherwise nonobstructive disease.  The LAD was successfully treated with drug-eluting stent with improvement in exertional symptoms.  Postprocedure, he had a significant right wrist and forearm hematoma without any other vascular complications.  He had recurrent chest pain following discharge prompting relook catheterization, which showed patent  LAD stent otherwise nonobstructive disease.  He was noted to have an LVOT gradient of 50-80 mmHg at rest and 70-100 mmHg with provocation.  Subsequent cardiac MRI showed normal LV function without evidence for hypertrophic cardiomyopathy or LVOT gradient in the setting of recurrent symptoms in early 2024, upon reevaluation of MRI, it was felt that a subaortic membrane may be present.  Repeat catheterization April 2024 again showed mild to moderate nonobstructive CAD without culprit for patient's ongoing chest pain and dyspnea.  Due to progressive symptoms, he underwent another catheterization in June 2024 which again showed nonobstructive CAD.  A slow pullback was performed across the aortic valve showed severe attic stenosis with a mean gradient of 31.5 mmHg and calculated valve area of 1.05 cm (aortic index 0.42). no significant LVOT gradient was noted during pullback.  He was subsequently seen by CT surgery and in [facility] and underwent successful aortic valve replacement with a 23 mm bioprosthetic valve, along with resection of subaortic membrane, and septal myectomy.  He was discharged home November 02, 2022 but readmitted June 26th due to sharp chest pain, dyspnea, and failure to thrive.  CT of the abdomen and pelvis showed no acute pathology.  CT angiogram of the chest showed a 6 cm fluid collection representing postoperative hematoma  and bilateral pleural effusions.  No evidence of pulmonary embolism was present.  He was noted to be B12, iron, and vitamin D deficient, and these were replaced.  Acute kidney injury improved.  Follow-up limited echo showed normal LV function and normal functioning aortic valve prosthesis.  He received aggressive physical and occupational therapy with improved activity tolerance and improved dyspnea.  He was discharged home July 7.   Since his hospitalizations, Mr. Eisenhardt continues to feel deconditioned.  He notes ongoing dyspnea on exertion with minimal activity but is now  seeing physical and Occupational Therapy at home and is hoping to improve his conditioning so that he may effectively participate in cardiac rehab in the future.  He continues to have some chest wall tenderness and pain, which is generally well-controlled with Tylenol though sometimes he will use oxycodone if pain is severe prior to bedtime.  He has not had any symptoms that he would identify as angina.  He denies palpitations, PND, orthopnea, dizziness, syncope, or early satiety.  He has been sitting quite a bit and sometimes notes mild ankle swelling.  Home Medications    Current Outpatient Medications  Medication Sig Dispense Refill   acetaminophen (TYLENOL) 325 MG tablet Take 650 mg by mouth every 6 (six) hours as needed for moderate pain.     albuterol (VENTOLIN HFA) 108 (90 Base) MCG/ACT inhaler Inhale 1-2 puffs into the lungs every 6 (six) hours as needed for wheezing or shortness of breath. 18 g 1   aspirin EC 325 MG tablet Take 1 tablet (325 mg total) by mouth daily.     atorvastatin (LIPITOR) 40 MG tablet Take 1 tablet (40 mg total) by mouth daily at 6 PM. 30 tablet 2   cholecalciferol (CHOLECALCIFEROL) 25 MCG tablet Take 1 tablet (1,000 Units total) by mouth daily. 30 tablet 0   cyanocobalamin (VITAMIN B12) 1000 MCG tablet Take 1 tablet (1,000 mcg total) by mouth daily.     cyclobenzaprine (FLEXERIL) 10 MG tablet Take 1 tablet (10 mg total) by mouth 3 (three) times daily as needed.For spasms (Patient taking differently: Take 30 mg by mouth daily as needed for muscle spasms. For spasms) 90 tablet 5   DULoxetine (CYMBALTA) 60 MG capsule Take 120 mg by mouth at bedtime.     famotidine (PEPCID) 40 MG tablet Take 40 mg by mouth at bedtime.     ferrous sulfate 325 (65 FE) MG EC tablet Take 1 tablet (325 mg total) by mouth daily with breakfast. 30 tablet 0   fluticasone (FLONASE) 50 MCG/ACT nasal spray Place 1 spray into both nostrils daily as needed for allergies or rhinitis.      fluticasone-salmeterol (ADVAIR) 250-50 MCG/ACT AEPB Inhale 1 puff into the lungs every 12 (twelve) hours. 60 each 6   gabapentin (NEURONTIN) 300 MG capsule Take 300 mg by mouth at bedtime.     glucose blood (GE100 BLOOD GLUCOSE TEST) test strip Use as instructed 100 each 12   insulin glargine (LANTUS) 100 unit/mL SOPN Inject 30 Units into the skin daily. 15 mL 11   JARDIANCE 25 MG TABS tablet Take 25 mg by mouth daily.     Lancets (ONETOUCH ULTRASOFT) lancets Check sugar twice daily 100 each 12   lidocaine (LIDODERM) 5 % Place 1 patch onto the skin daily. Remove & Discard patch within 12 hours or as directed by MD. May place on right or left side of chest (Patient taking differently: Place 1 patch onto the skin daily as needed (pain).  Remove & Discard patch within 12 hours or as directed by MD. May place on right or left side of chest) 14 patch 0   metFORMIN (GLUCOPHAGE) 1000 MG tablet Take 1 tablet (1,000 mg total) by mouth 2 (two) times daily with a meal. 180 tablet 1   metoprolol tartrate (LOPRESSOR) 25 MG tablet Take 0.5 tablets (12.5 mg total) by mouth 2 (two) times daily. 30 tablet 2   traZODone (DESYREL) 50 MG tablet Take 50 mg by mouth at bedtime.     No current facility-administered medications for this visit.     Review of Systems    Ongoing dyspnea on exertion and chest wall/incisional pain.  Occasional ankle edema at the end of the day.  He has not been having palpitations, PND, orthopnea, dizziness, syncope, or early satiety.  All other systems reviewed and are otherwise negative except as noted above.   Cardiac Rehabilitation Eligibility Assessment  The patient is NOT ready to start cardiac rehabilitation due to: Other    Physical Exam    VS:  BP 110/84 (BP Location: Left Arm, Patient Position: Sitting, Cuff Size: Large)   Pulse 99   Ht 5\' 8"  (1.727 m)   Wt (!) 318 lb 9.6 oz (144.5 kg)   SpO2 97%   BMI 48.44 kg/m  , BMI Body mass index is 48.44 kg/m.     GEN: Obese,  in no acute distress. HEENT: normal. Neck: Supple, no JVD, carotid bruits, or masses. Cardiac: RRR, 2/6 systolic ejection murmur at the upper sternal borders, no rubs or gallops. No clubbing, cyanosis, trace bilateral ankle edema.  Radials 2+/PT 2+ and equal bilaterally.  Midline sternal incision without erythema or drainage. Respiratory:  Respirations regular and unlabored, diminished breath sounds at bilateral bases. GI: Soft, nontender, nondistended, BS + x 4. MS: no deformity or atrophy. Skin: warm and dry, no rash. Neuro:  Strength and sensation are intact. Psych: Normal affect.  Accessory Clinical Findings    ECG personally reviewed by me today - EKG Interpretation Date/Time:  Thursday November 26 2022 08:26:12 EDT Ventricular Rate:  99 PR Interval:  196 QRS Duration:  106 QT Interval:  362 QTC Calculation: 464 R Axis:   1  Text Interpretation: Normal sinus rhythm Nonspecific T wave abnormality Prolonged QT When compared with ECG of 14-Nov-2022 11:17, Nonspecific T wave abnormality, worse in Lateral leads Confirmed by Nicolasa Ducking [phone]) on 11/26/2022 8:33:27 AM  - no acute changes.  Lab Results  Component Value Date   WBC 8.2 11/22/2022   HGB 8.2 (L) 11/22/2022   HCT 27.6 (L) 11/22/2022   MCV 88.2 11/22/2022   PLT 436 (H) 11/22/2022   Lab Results  Component Value Date   CREATININE 1.19 11/22/2022   BUN 17 11/22/2022   NA 135 11/22/2022   K 4.2 11/22/2022   CL 99 11/22/2022   CO2 27 11/22/2022   Lab Results  Component Value Date   ALT 19 11/22/2022   AST 23 11/22/2022   ALKPHOS 103 11/22/2022   BILITOT 0.5 11/22/2022   Lab Results  Component Value Date   CHOL 109 09/08/2022   HDL 31 (L) 09/08/2022   LDLCALC 50 09/08/2022   TRIG 138 09/08/2022   CHOLHDL 3.5 09/08/2022    Lab Results  Component Value Date   HGBA1C 10.0 (H) 10/28/2022    Assessment & Plan    1.  Aortic stenosis status post surgical bioprosthetic aortic valve replacement: Diagnostic  catheterization in June 2024 confirmed nonobstructive CAD and  severe aortic stenosis.  He subsequently underwent 23 mm bioprosthetic aortic valve replacement as well as septal myectomy resection of subaortic membrane.  Postoperative course complicated by ongoing dyspnea and failure to thrive requiring readmission with discharged just 4 days ago.  Limited echo during most recent hospital stay showed normal functioning aortic valve and normal LV function.  Patient continues to have dyspnea on exertion in the setting of significant deconditioning.  He has seen home PT and home OT will be coming out as well.  Eventual cardiac rehab pending progress.  Ongoing incisional/chest wall discomfort, which is mostly managing with Tylenol.  He has markedly diminished breath sounds at the bases on examination I will arrange for follow-up chest x-ray today.  He did have atelectasis and small effusions previously.  Plan for complete echo in about 2 weeks.  We will arrange for CT surgical follow-up as well.  2.  Coronary artery disease: Status post prior LAD stenting with most recent catheterization in June 2024 showing nonobstructive disease with patent LAD stent.  He has chest wall pain but no angina.  He remains on aspirin, statin, and beta-blocker therapy.  3.  Essential hypertension: Blood pressure is controlled today.  Continue beta-blocker.  4.  Hyperlipidemia: LDL of 58 in April with normal LFTs earlier this month.  Continue statin therapy.  5.  Normocytic anemia: H&H 8.2 and 27.6 on July 7.  Likely contributing to deconditioning/dyspnea.  Will plan to follow-up lab work at office visit 1 month.  6.  Type 2 diabetes mellitus: Poorly controlled with hemoglobin A1c of 10.0.  He has follow-up with primary care next week and remains on metformin, empagliflozin, and Lantus.  7.  Morbid obesity: Hopefully, with improved conditioning and cardiac rehabilitation, patient will be able to more effectively exercise and lose  weight.  Consider GLP-1 agonist if financially feasible.  8.  Disposition: Follow-up echo in a few weeks as previously planned.  Follow-up with CT surgery.  Follow-up here in 1 month with plan for CBC and basic metabolic panel at that time.  Informed Consent    Nicolasa Ducking, NP 11/26/2022, 3:52 PM

## 2022-11-26 NOTE — Patient Instructions (Addendum)
Medication Instructions:  Your physician recommends that you continue on your current medications as directed. Please refer to the Current Medication list given to you today.  *If you need a refill on your cardiac medications before your next appointment, please call your pharmacy*  Lab Work: None ordered If you have labs (blood work) drawn today and your tests are completely normal, you will receive your results only by: MyChart Message (if you have MyChart) OR A paper copy in the mail If you have any lab test that is abnormal or we need to change your treatment, we will call you to review the results.  Testing/Procedures: Your provider has ordered a chest X-Ray for you. You can have this done at the [facility] medical mall. You do not need an appointment. Please go to the entrance of the Medical Mall and check in at the front desk.  Follow-Up: At [facility], you and your health needs are our priority.  As part of our continuing mission to provide you with exceptional heart care, we have created designated Provider Care Teams.  These Care Teams include your primary Cardiologist (physician) and Advanced Practice Providers (APPs -  Physician Assistants and Nurse Practitioners) who all work together to provide you with the care you need, when you need it.  Your next appointment:   12/27/22 at 8:00 AM as scheduled  Provider:   Nicolasa Ducking, NP  Schedule 2 week follow up with Dr Leafy Ro

## 2022-12-01 DIAGNOSIS — Z48812 Encounter for surgical aftercare following surgery on the circulatory system: Secondary | ICD-10-CM | POA: Diagnosis not present

## 2022-12-01 DIAGNOSIS — I110 Hypertensive heart disease with heart failure: Secondary | ICD-10-CM | POA: Diagnosis not present

## 2022-12-01 DIAGNOSIS — G4733 Obstructive sleep apnea (adult) (pediatric): Secondary | ICD-10-CM | POA: Diagnosis not present

## 2022-12-01 DIAGNOSIS — J4520 Mild intermittent asthma, uncomplicated: Secondary | ICD-10-CM | POA: Diagnosis not present

## 2022-12-01 DIAGNOSIS — D649 Anemia, unspecified: Secondary | ICD-10-CM | POA: Diagnosis not present

## 2022-12-01 DIAGNOSIS — I5042 Chronic combined systolic (congestive) and diastolic (congestive) heart failure: Secondary | ICD-10-CM | POA: Diagnosis not present

## 2022-12-01 DIAGNOSIS — E1142 Type 2 diabetes mellitus with diabetic polyneuropathy: Secondary | ICD-10-CM | POA: Diagnosis not present

## 2022-12-01 DIAGNOSIS — I2510 Atherosclerotic heart disease of native coronary artery without angina pectoris: Secondary | ICD-10-CM | POA: Diagnosis not present

## 2022-12-01 DIAGNOSIS — M4810 Ankylosing hyperostosis [Forestier], site unspecified: Secondary | ICD-10-CM | POA: Diagnosis not present

## 2022-12-01 DIAGNOSIS — G4700 Insomnia, unspecified: Secondary | ICD-10-CM | POA: Diagnosis not present

## 2022-12-01 DIAGNOSIS — G8929 Other chronic pain: Secondary | ICD-10-CM | POA: Diagnosis not present

## 2022-12-01 DIAGNOSIS — I272 Pulmonary hypertension, unspecified: Secondary | ICD-10-CM | POA: Diagnosis not present

## 2022-12-01 DIAGNOSIS — M5136 Other intervertebral disc degeneration, lumbar region: Secondary | ICD-10-CM | POA: Diagnosis not present

## 2022-12-01 DIAGNOSIS — N179 Acute kidney failure, unspecified: Secondary | ICD-10-CM | POA: Diagnosis not present

## 2022-12-01 DIAGNOSIS — I421 Obstructive hypertrophic cardiomyopathy: Secondary | ICD-10-CM | POA: Diagnosis not present

## 2022-12-02 ENCOUNTER — Telehealth: Payer: Self-pay | Admitting: *Deleted

## 2022-12-02 ENCOUNTER — Telehealth: Payer: Self-pay | Admitting: Cardiovascular Disease

## 2022-12-02 DIAGNOSIS — D649 Anemia, unspecified: Secondary | ICD-10-CM | POA: Diagnosis not present

## 2022-12-02 DIAGNOSIS — M4810 Ankylosing hyperostosis [Forestier], site unspecified: Secondary | ICD-10-CM | POA: Diagnosis not present

## 2022-12-02 DIAGNOSIS — I110 Hypertensive heart disease with heart failure: Secondary | ICD-10-CM | POA: Diagnosis not present

## 2022-12-02 DIAGNOSIS — Z48812 Encounter for surgical aftercare following surgery on the circulatory system: Secondary | ICD-10-CM | POA: Diagnosis not present

## 2022-12-02 DIAGNOSIS — N179 Acute kidney failure, unspecified: Secondary | ICD-10-CM | POA: Diagnosis not present

## 2022-12-02 DIAGNOSIS — I272 Pulmonary hypertension, unspecified: Secondary | ICD-10-CM | POA: Diagnosis not present

## 2022-12-02 DIAGNOSIS — G4700 Insomnia, unspecified: Secondary | ICD-10-CM | POA: Diagnosis not present

## 2022-12-02 DIAGNOSIS — Z79899 Other long term (current) drug therapy: Secondary | ICD-10-CM

## 2022-12-02 DIAGNOSIS — I5042 Chronic combined systolic (congestive) and diastolic (congestive) heart failure: Secondary | ICD-10-CM | POA: Diagnosis not present

## 2022-12-02 DIAGNOSIS — M5136 Other intervertebral disc degeneration, lumbar region: Secondary | ICD-10-CM | POA: Diagnosis not present

## 2022-12-02 DIAGNOSIS — G4733 Obstructive sleep apnea (adult) (pediatric): Secondary | ICD-10-CM | POA: Diagnosis not present

## 2022-12-02 DIAGNOSIS — G8929 Other chronic pain: Secondary | ICD-10-CM | POA: Diagnosis not present

## 2022-12-02 DIAGNOSIS — J4520 Mild intermittent asthma, uncomplicated: Secondary | ICD-10-CM | POA: Diagnosis not present

## 2022-12-02 DIAGNOSIS — E1142 Type 2 diabetes mellitus with diabetic polyneuropathy: Secondary | ICD-10-CM | POA: Diagnosis not present

## 2022-12-02 DIAGNOSIS — I2510 Atherosclerotic heart disease of native coronary artery without angina pectoris: Secondary | ICD-10-CM | POA: Diagnosis not present

## 2022-12-02 DIAGNOSIS — I421 Obstructive hypertrophic cardiomyopathy: Secondary | ICD-10-CM | POA: Diagnosis not present

## 2022-12-02 MED ORDER — FUROSEMIDE 20 MG PO TABS: 20.0000 mg | ORAL_TABLET | Freq: Every day | ORAL | 3 refills | Status: DC

## 2022-12-02 NOTE — Telephone Encounter (Signed)
Patient's wife called regarding a follow up appt with Dr. Leafy Ro. Per wife patient patient has been experiencing SOB and cough. States HR has been ranging between 101-104. States cough is mostly dry. States SOB occurs with both ambulation and rest. CXR done by Cardiology 7/11. Per result note, patient instructed to begin furosemide. Patient and wife have not received results of CXR nor provided any instructions at this time. Advised wife to contact Cardiology for results. CXR reviewed by Webb Laws, PA. Advised by PA that effusion is not large enough for thoracentesis at this time. Appt scheduled for patient to be evaluated by Dr. Leafy Ro on Monday with repeat for CXR. Wife made aware of appt date/time.

## 2022-12-02 NOTE — Telephone Encounter (Signed)
Pt's wife would like a callback regarding pt's chest xray. Please advise.

## 2022-12-02 NOTE — Telephone Encounter (Signed)
Called and spoke with wife [place]. Informed her of the following recommendations from [employer], PA-C.   Chest x-ray showed stable small to moderate left pleural effusion with some degree of atelectasis.  Overall, stable findings when compared to prior studies.  Recent hospitalization and labs reviewed.  Discussed with primary cardiologist.   Recommendations:  -Start furosemide 20 mg daily  -We would like for him to contact his cardiothoracic surgeon's office to see if he can be evaluated this week by them for possible thoracentesis, if amenable  -BMP should be obtained one week after initiating furosemide   Wife verbalized understanding. Prescription sent to preferred pharmacy and order placed for BMP.

## 2022-12-04 ENCOUNTER — Other Ambulatory Visit: Payer: Self-pay | Admitting: Thoracic Surgery (Cardiothoracic Vascular Surgery)

## 2022-12-04 DIAGNOSIS — Z952 Presence of prosthetic heart valve: Secondary | ICD-10-CM

## 2022-12-06 DIAGNOSIS — G4733 Obstructive sleep apnea (adult) (pediatric): Secondary | ICD-10-CM | POA: Diagnosis not present

## 2022-12-06 NOTE — Progress Notes (Signed)
[address]       [place] [address]             [phone]           Brett [facility] Medical Record #[id number] Date of Birth: 08-02-1965  [name], MD Ethelda Chick, MD  Chief Complaint:  Follow up AVR   History of Present Illness:     Pt is now 5 weeks sp AVR with tissue prosthesis with annular enlarging and myectomy. Pt has had a rough time after dc with failure to thrive and ongoing symptoms of chest discomfort and DOE. Pt reports occasional lightheadedness with standing but no syncope. He has lower right chest discomfort and occasional left lower sharp pain that radiates to neck. He is somewhat discouraged about the process at this point. He is able to eat without issues. On recent echo his LV has an EF of 65-70% and with a well seated AVR with a mean gradient of and no regional wall motion abnormailities      Past Medical History:  Diagnosis Date   Asthma    CAD (coronary artery disease)    a. 08/2017 Cath: Diff nonobs dzs, EF 45-50%, diff HK; b. 08/2021 MV: Ant/apical ishemia; c. 08/2021 PCI: LAD 95ost (4.0x15 Onyx Frontier DES), EF 55-65%; c. 09/2021 Cath: LM nl, LAD patent stent, 62m, 30d, RI min irregs, LCX min irregs, RCA 50p. LVOT grad 50-73mmHg (rest), 70-172mmHg (provoked); d. 10/2022 Cath: LM nl, LAD 35m, 60d, RI min irregs, LCX min irregs, RCA 50p.   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 08/2017 LV gram: EF 45-50%, glob HK; b. 08/2021 Echo: EF 60-65%, no rwma, GrI DD, nl RV fxn; c. 09/2021 cMRI: EF 60%, no LVOT obstruction/gradient. Mild conc LVH, mild AS. No myocardial scar/fibrosis. No evidence of HCM.   Hyperlipidemia    Leg swelling    Left   Low back pain    Mixed ICM & NICM    a.  08/2017 LV gram: EF 45-50%, glob HK; b. 08/2021 Echo: EF 60-65%.   Morbid obesity (HCC)    OSA on CPAP    non compliant   Pulmonary hypertension (HCC)    Severe aortic stenosis    a. 10/2022 Cath: 31.56mmHg gradient and AVA 1.05cm^2; b. 10/2022 s/p 23mm Edwards Resilia bioprosthetic AoV, resection of sub-aortic membrane, and septal myectomy; c. 10/2022 Echo: EF 65-70%, no rwma, nl AoV structure/fxn.    Past Surgical History:  Procedure Laterality Date   AORTIC ROOT ENLARGEMENT N/A 10/30/2022   Procedure: AORTIC ROOT ENLARGEMENT AND SUBAORTIC MYOMECTOMY;  Surgeon: Eugenio Hoes, MD;  Location: [facility] OR;  Service: Open Heart Surgery;  Laterality: N/A;   AORTIC VALVE REPLACEMENT N/A 10/30/2022   Procedure: AORTIC VALVE REPLACEMENT (AVR) USING INSPIRIS RESILIA AORTIC VALVE;  Surgeon: Eugenio Hoes, MD;  Location: [facility] OR;  Service: Open Heart Surgery;  Laterality: N/A;  Median sternotomy   CARDIAC CATHETERIZATION     [place] no stents.   COLONOSCOPY WITH PROPOFOL N/A 04/11/2019   Procedure: COLONOSCOPY WITH PROPOFOL;  Surgeon: Midge Minium, MD;  Location: [facility] ENDOSCOPY;  Service: Endoscopy;  Laterality: N/A;   CORONARY  STENT INTERVENTION N/A 09/08/2021   Procedure: CORONARY STENT INTERVENTION;  Surgeon: [name], MD;  Location: [facility] INVASIVE CV LAB;  Service: Cardiovascular;  Laterality: N/A;   CORONARY ULTRASOUND/IVUS N/A 09/08/2021   Procedure: Intravascular Ultrasound/IVUS;  Surgeon: [name], MD;  Location: [facility] INVASIVE CV LAB;  Service: Cardiovascular;  Laterality: N/A;   LEFT HEART CATH AND CORONARY ANGIOGRAPHY N/A 09/08/2021   Procedure: LEFT HEART CATH AND CORONARY ANGIOGRAPHY;  Surgeon: [name], MD;  Location: [facility] INVASIVE CV LAB;  Service: Cardiovascular;  Laterality: N/A;   LEFT HEART CATH AND CORONARY ANGIOGRAPHY N/A 09/17/2021   Procedure: LEFT HEART CATH AND CORONARY ANGIOGRAPHY;  Surgeon: Yvonne Kendall, MD;  Location: [facility] INVASIVE CV LAB;  Service:  Cardiovascular;  Laterality: N/A;   LEFT HEART CATH AND CORONARY ANGIOGRAPHY N/A 09/08/2022   Procedure: LEFT HEART CATH AND CORONARY ANGIOGRAPHY;  Surgeon: Yvonne Kendall, MD;  Location: [facility] INVASIVE CV LAB;  Service: Cardiovascular;  Laterality: N/A;   RIGHT/LEFT HEART CATH AND CORONARY ANGIOGRAPHY N/A 09/02/2017   Procedure: RIGHT/LEFT HEART CATH AND CORONARY ANGIOGRAPHY;  Surgeon: [name], MD;  Location: [facility] INVASIVE CV LAB;  Service: Cardiovascular;  Laterality: N/A;   RIGHT/LEFT HEART CATH AND CORONARY ANGIOGRAPHY Bilateral 10/19/2022   Procedure: RIGHT/LEFT HEART CATH AND CORONARY ANGIOGRAPHY;  Surgeon: [name], MD;  Location: [facility] INVASIVE CV LAB;  Service: Cardiovascular;  Laterality: Bilateral;   TEE WITHOUT CARDIOVERSION N/A 09/10/2022   Procedure: TRANSESOPHAGEAL ECHOCARDIOGRAM;  Surgeon: Antonieta Iba, MD;  Location: [facility] ORS;  Service: Cardiovascular;  Laterality: N/A;   TEE WITHOUT CARDIOVERSION N/A 10/30/2022   Procedure: TRANSESOPHAGEAL ECHOCARDIOGRAM;  Surgeon: Eugenio Hoes, MD;  Location[facility] OR;  Service: Open Heart Surgery;  Laterality: N/A;    Social History   Tobacco Use  Smoking Status Never  Smokeless Tobacco Never    Social History   Substance and Sexual Activity  Alcohol Use Not Currently    Social History   Socioeconomic History   Marital status: Married    Spouse name: Not on file   Number of children: 0   Years of education: Not on file   Highest education level: Not on file  Occupational History   Occupation: Quarry manager    Employer: [employer]  Tobacco Use   Smoking status: Never   Smokeless tobacco: Never  Vaping Use   Vaping status: Never Used  Substance and Sexual Activity   Alcohol use: Not Currently   Drug use: No   Sexual activity: Yes  Other Topics Concern   Not on file  Social History Narrative   Goes to gym.   Social Determinants of Health   Financial Resource Strain: Not on file  Food Insecurity: No  Food Insecurity (11/03/2022)   Hunger Vital Sign    Worried About Running Out of Food in the Last Year: Never true    Ran Out of Food in the Last Year: Never true  Transportation Needs: Patient Declined (11/03/2022)   PRAPARE - Administrator, Civil Service (Medical): Patient declined    Lack of Transportation (Non-Medical): Patient declined  Physical Activity: Not on file  Stress: Not on file  Social Connections: Not on file  Intimate Partner Violence: Not At Risk (11/03/2022)   Humiliation, Afraid, Rape, and Kick questionnaire    Fear of Current or Ex-Partner: No    Emotionally Abused: No    Physically Abused: No    Sexually Abused: No    No Known Allergies  Current Outpatient Medications  Medication Sig Dispense Refill   acetaminophen (TYLENOL) 325 MG tablet Take 650 mg by mouth every 6 (six) hours as needed for moderate pain.     albuterol (VENTOLIN HFA) 108 (90 Base) MCG/ACT inhaler Inhale 1-2 puffs into the lungs every 6 (six) hours as needed for wheezing or shortness of breath. 18 g 1   aspirin EC 325 MG tablet Take 1 tablet (325 mg total) by mouth daily.     atorvastatin (LIPITOR) 40 MG tablet Take 1 tablet (40 mg total) by mouth daily at 6 PM. 30 tablet 2   cholecalciferol (CHOLECALCIFEROL) 25 MCG tablet Take 1 tablet (1,000 Units total) by mouth daily. 30 tablet 0   cyanocobalamin (VITAMIN B12) 1000 MCG tablet Take 1 tablet (1,000 mcg total) by mouth daily.     cyclobenzaprine (FLEXERIL) 10 MG tablet Take 1 tablet (10 mg total) by mouth 3 (three) times daily as needed.For spasms (Patient taking differently: Take 30 mg by mouth daily as needed for muscle spasms. For spasms) 90 tablet 5   DULoxetine (CYMBALTA) 60 MG capsule Take 120 mg by mouth at bedtime.     famotidine (PEPCID) 40 MG tablet Take 40 mg by mouth at bedtime.     ferrous sulfate 325 (65 FE) MG EC tablet Take 1 tablet (325 mg total) by mouth daily with breakfast. 30 tablet 0   fluticasone (FLONASE) 50  MCG/ACT nasal spray Place 1 spray into both nostrils daily as needed for allergies or rhinitis.     fluticasone-salmeterol (ADVAIR) 250-50 MCG/ACT AEPB Inhale 1 puff into the lungs every 12 (twelve) hours. 60 each 6   furosemide (LASIX) 20 MG tablet Take 1 tablet (20 mg total) by mouth daily. 90 tablet 3   gabapentin (NEURONTIN) 300 MG capsule Take 300 mg by mouth at bedtime.     glucose blood (GE100 BLOOD GLUCOSE TEST) test strip Use as instructed 100 each 12   insulin glargine (LANTUS) 100 unit/mL SOPN Inject 30 Units into the skin daily. 15 mL 11   JARDIANCE 25 MG TABS tablet Take 25 mg by mouth daily.     Lancets (ONETOUCH ULTRASOFT) lancets Check sugar twice daily 100 each 12   lidocaine (LIDODERM) 5 % Place 1 patch onto the skin daily. Remove & Discard patch within 12 hours or as directed by MD. May place on right or left side of chest (Patient taking differently: Place 1 patch onto the skin daily as needed (pain). Remove & Discard patch within 12 hours or as directed by MD. May place on right or left side of chest) 14 patch 0   metFORMIN (GLUCOPHAGE) 1000 MG tablet Take 1 tablet (1,000 mg total) by mouth 2 (two) times daily with a meal. 180 tablet 1   metoprolol tartrate (LOPRESSOR) 25 MG tablet Take 0.5 tablets (12.5 mg total) by mouth 2 (two) times daily. 30 tablet 2   traZODone (DESYREL) 50 MG tablet Take 50 mg by mouth at bedtime.     No current facility-administered medications for this visit.     Family History  Problem Relation Age of Onset   Diabetes Mother    Asthma Mother    Heart disease Father    Heart attack Father    Sjogren's syndrome Sister    Heart disease Brother 33       CAD/CABG       Physical Exam: Appears well Lungs: clear with decreased left base Card: RR with no murmur Ext: no edema Neuro:  alert and oriented     Diagnostic Studies & Laboratory data: I have personally reviewed the following studies and agree with the findings     Recent  Radiology Findings:   CXR tpday with small left effusion    Recent Lab Findings: Lab Results  Component Value Date   WBC 8.2 11/22/2022   HGB 8.2 (L) 11/22/2022   HCT 27.6 (L) 11/22/2022   PLT 436 (H) 11/22/2022   GLUCOSE 235 (H) 11/22/2022   CHOL 109 09/08/2022   TRIG 138 09/08/2022   HDL 31 (L) 09/08/2022   LDLCALC 50 09/08/2022   ALT 19 11/22/2022   AST 23 11/22/2022   NA 135 11/22/2022   K 4.2 11/22/2022   CL 99 11/22/2022   CREATININE 1.19 11/22/2022   BUN 17 11/22/2022   CO2 27 11/22/2022   TSH 7.559 (H) 11/12/2022   INR 1.6 (H) 10/30/2022   HGBA1C 10.0 (H) 10/28/2022      Assessment / Plan:     SP AVR 5 weeks. We discussed the process of recovery and that on echo there is no issues that are concerning. It may be diastolic dysfunction issues that will need delicate balancing of medical management of pushing BB and adjusting diuretics. With pleural effusion may need to continue low dose lasix but if continues with light headedness with standing would discontinue and perhaps evaluate for thorocentesis if effusion worsens. From a pain standpoint, I have recommended Ibuprofen 400mg  tid for now and to observe. If continues a non contrast ct of chest could look for nonunion but sternum stable on exam and pinpoint tenderness points to possible costochondral issues that will take time to resolve. Pain clinic my hep if this goes into chronic pain issues.  He is cleared for cardiac rehab and able to drive once of narcotics and able to turn wheel without pain. Will follow up prn   I have spent 20 min in review of the records, viewing studies and in face to face with patient and in coordination of future care    Eugenio Hoes 12/06/2022 8:08 PM

## 2022-12-07 ENCOUNTER — Encounter: Payer: Self-pay | Admitting: Thoracic Surgery (Cardiothoracic Vascular Surgery)

## 2022-12-07 ENCOUNTER — Ambulatory Visit (INDEPENDENT_AMBULATORY_CARE_PROVIDER_SITE_OTHER): Payer: Self-pay | Admitting: Thoracic Surgery (Cardiothoracic Vascular Surgery)

## 2022-12-07 ENCOUNTER — Ambulatory Visit
Admission: RE | Admit: 2022-12-07 | Discharge: 2022-12-07 | Disposition: A | Discharge status: Home / Self Care | Payer: BLUE CROSS/BLUE SHIELD | Source: Ambulatory Visit | Attending: Thoracic Surgery (Cardiothoracic Vascular Surgery) | Admitting: Thoracic Surgery (Cardiothoracic Vascular Surgery)

## 2022-12-07 VITALS — BP 108/76 | HR 98 | Resp 18 | Ht 68.0 in | Wt 303.0 lb

## 2022-12-07 DIAGNOSIS — Z952 Presence of prosthetic heart valve: Secondary | ICD-10-CM

## 2022-12-07 DIAGNOSIS — J90 Pleural effusion, not elsewhere classified: Secondary | ICD-10-CM | POA: Diagnosis not present

## 2022-12-07 MED ORDER — IBUPROFEN 200 MG PO TABS: 400.0000 mg | ORAL_TABLET | Freq: Three times a day (TID) | ORAL | 2 refills | Status: AC

## 2022-12-08 DIAGNOSIS — J4520 Mild intermittent asthma, uncomplicated: Secondary | ICD-10-CM | POA: Diagnosis not present

## 2022-12-08 DIAGNOSIS — D649 Anemia, unspecified: Secondary | ICD-10-CM | POA: Diagnosis not present

## 2022-12-08 DIAGNOSIS — G8929 Other chronic pain: Secondary | ICD-10-CM | POA: Diagnosis not present

## 2022-12-08 DIAGNOSIS — I272 Pulmonary hypertension, unspecified: Secondary | ICD-10-CM | POA: Diagnosis not present

## 2022-12-08 DIAGNOSIS — Z48812 Encounter for surgical aftercare following surgery on the circulatory system: Secondary | ICD-10-CM | POA: Diagnosis not present

## 2022-12-08 DIAGNOSIS — E1142 Type 2 diabetes mellitus with diabetic polyneuropathy: Secondary | ICD-10-CM | POA: Diagnosis not present

## 2022-12-08 DIAGNOSIS — M4810 Ankylosing hyperostosis [Forestier], site unspecified: Secondary | ICD-10-CM | POA: Diagnosis not present

## 2022-12-08 DIAGNOSIS — I2510 Atherosclerotic heart disease of native coronary artery without angina pectoris: Secondary | ICD-10-CM | POA: Diagnosis not present

## 2022-12-08 DIAGNOSIS — I421 Obstructive hypertrophic cardiomyopathy: Secondary | ICD-10-CM | POA: Diagnosis not present

## 2022-12-08 DIAGNOSIS — M5136 Other intervertebral disc degeneration, lumbar region: Secondary | ICD-10-CM | POA: Diagnosis not present

## 2022-12-08 DIAGNOSIS — N179 Acute kidney failure, unspecified: Secondary | ICD-10-CM | POA: Diagnosis not present

## 2022-12-08 DIAGNOSIS — I110 Hypertensive heart disease with heart failure: Secondary | ICD-10-CM | POA: Diagnosis not present

## 2022-12-08 DIAGNOSIS — G4700 Insomnia, unspecified: Secondary | ICD-10-CM | POA: Diagnosis not present

## 2022-12-08 DIAGNOSIS — I5042 Chronic combined systolic (congestive) and diastolic (congestive) heart failure: Secondary | ICD-10-CM | POA: Diagnosis not present

## 2022-12-08 DIAGNOSIS — G4733 Obstructive sleep apnea (adult) (pediatric): Secondary | ICD-10-CM | POA: Diagnosis not present

## 2022-12-09 ENCOUNTER — Telehealth: Payer: Self-pay | Admitting: Cardiovascular Disease

## 2022-12-09 NOTE — Telephone Encounter (Signed)
Called and informed patient of the following from Eula Listen, PA-C.  Echo on 11/14/2022, during repeat admission, showed normal device structure and function of prosthetic aortic valve. Given this, echo scheduled for 12/11/2022 is not needed at this time and may be canceled. He should follow-up as scheduled next month.   Patient verbalized understanding.

## 2022-12-09 NOTE — Telephone Encounter (Signed)
Pt has an ECHO scheduled for Friday but is requesting a callback to see if he should keep it or cancel it since he had one in June. Please advise

## 2022-12-09 NOTE — Telephone Encounter (Signed)
Called and spoke with patient. Patient said that he was seen by Dr. Leafy Ro yesterday and was told to call and ask if he still needed the ECHO that is scheduled for 12/11/22.

## 2022-12-09 NOTE — Telephone Encounter (Signed)
Echo on 11/14/2022, during repeat admission, showed normal device structure and function of prosthetic aortic valve.  Given this, echo scheduled for 12/11/2022 is not needed at this time and may be canceled.  He should follow-up as scheduled next month.

## 2022-12-10 ENCOUNTER — Other Ambulatory Visit: Payer: Self-pay | Admitting: *Deleted

## 2022-12-10 ENCOUNTER — Other Ambulatory Visit
Admission: RE | Admit: 2022-12-10 | Discharge: 2022-12-10 | Disposition: A | Discharge status: Home / Self Care | Payer: BLUE CROSS/BLUE SHIELD | Source: Ambulatory Visit | Attending: Physician Assistant | Admitting: Physician Assistant

## 2022-12-10 DIAGNOSIS — J90 Pleural effusion, not elsewhere classified: Secondary | ICD-10-CM

## 2022-12-10 DIAGNOSIS — I25110 Atherosclerotic heart disease of native coronary artery with unstable angina pectoris: Secondary | ICD-10-CM | POA: Diagnosis not present

## 2022-12-10 DIAGNOSIS — I350 Nonrheumatic aortic (valve) stenosis: Secondary | ICD-10-CM | POA: Diagnosis not present

## 2022-12-10 DIAGNOSIS — Z79899 Other long term (current) drug therapy: Secondary | ICD-10-CM | POA: Insufficient documentation

## 2022-12-10 DIAGNOSIS — E1142 Type 2 diabetes mellitus with diabetic polyneuropathy: Secondary | ICD-10-CM | POA: Diagnosis not present

## 2022-12-10 LAB — BASIC METABOLIC PANEL
Anion gap: 10 (ref 5–15)
BUN: 16 mg/dL (ref 6–20)
CO2: 24 mmol/L (ref 22–32)
Calcium: 8.8 mg/dL — ABNORMAL LOW (ref 8.9–10.3)
Chloride: 103 mmol/L (ref 98–111)
Creatinine, Ser: 1.01 mg/dL (ref 0.61–1.24)
GFR, Estimated: >60 mL/min (ref >60)
Glucose, Bld: 107 mg/dL — ABNORMAL HIGH (ref 70–99)
Potassium: 3.6 mmol/L (ref 3.5–5.1)
Sodium: 137 mmol/L (ref 135–145)

## 2022-12-11 ENCOUNTER — Other Ambulatory Visit: Payer: BLUE CROSS/BLUE SHIELD

## 2022-12-17 DIAGNOSIS — M4810 Ankylosing hyperostosis [Forestier], site unspecified: Secondary | ICD-10-CM | POA: Diagnosis not present

## 2022-12-17 DIAGNOSIS — M5136 Other intervertebral disc degeneration, lumbar region: Secondary | ICD-10-CM | POA: Diagnosis not present

## 2022-12-17 DIAGNOSIS — D649 Anemia, unspecified: Secondary | ICD-10-CM | POA: Diagnosis not present

## 2022-12-17 DIAGNOSIS — I421 Obstructive hypertrophic cardiomyopathy: Secondary | ICD-10-CM | POA: Diagnosis not present

## 2022-12-17 DIAGNOSIS — J4520 Mild intermittent asthma, uncomplicated: Secondary | ICD-10-CM | POA: Diagnosis not present

## 2022-12-17 DIAGNOSIS — Z48812 Encounter for surgical aftercare following surgery on the circulatory system: Secondary | ICD-10-CM | POA: Diagnosis not present

## 2022-12-17 DIAGNOSIS — I272 Pulmonary hypertension, unspecified: Secondary | ICD-10-CM | POA: Diagnosis not present

## 2022-12-17 DIAGNOSIS — I2510 Atherosclerotic heart disease of native coronary artery without angina pectoris: Secondary | ICD-10-CM | POA: Diagnosis not present

## 2022-12-17 DIAGNOSIS — E1142 Type 2 diabetes mellitus with diabetic polyneuropathy: Secondary | ICD-10-CM | POA: Diagnosis not present

## 2022-12-17 DIAGNOSIS — G4700 Insomnia, unspecified: Secondary | ICD-10-CM | POA: Diagnosis not present

## 2022-12-17 DIAGNOSIS — G8929 Other chronic pain: Secondary | ICD-10-CM | POA: Diagnosis not present

## 2022-12-17 DIAGNOSIS — N179 Acute kidney failure, unspecified: Secondary | ICD-10-CM | POA: Diagnosis not present

## 2022-12-17 DIAGNOSIS — I5042 Chronic combined systolic (congestive) and diastolic (congestive) heart failure: Secondary | ICD-10-CM | POA: Diagnosis not present

## 2022-12-17 DIAGNOSIS — I110 Hypertensive heart disease with heart failure: Secondary | ICD-10-CM | POA: Diagnosis not present

## 2022-12-17 DIAGNOSIS — G4733 Obstructive sleep apnea (adult) (pediatric): Secondary | ICD-10-CM | POA: Diagnosis not present

## 2022-12-21 DIAGNOSIS — I5022 Chronic systolic (congestive) heart failure: Secondary | ICD-10-CM | POA: Diagnosis not present

## 2022-12-21 DIAGNOSIS — I25110 Atherosclerotic heart disease of native coronary artery with unstable angina pectoris: Secondary | ICD-10-CM | POA: Diagnosis not present

## 2022-12-21 DIAGNOSIS — I421 Obstructive hypertrophic cardiomyopathy: Secondary | ICD-10-CM | POA: Diagnosis not present

## 2022-12-21 DIAGNOSIS — E1142 Type 2 diabetes mellitus with diabetic polyneuropathy: Secondary | ICD-10-CM | POA: Diagnosis not present

## 2022-12-22 DIAGNOSIS — G8929 Other chronic pain: Secondary | ICD-10-CM | POA: Diagnosis not present

## 2022-12-22 DIAGNOSIS — G4733 Obstructive sleep apnea (adult) (pediatric): Secondary | ICD-10-CM | POA: Diagnosis not present

## 2022-12-22 DIAGNOSIS — D649 Anemia, unspecified: Secondary | ICD-10-CM | POA: Diagnosis not present

## 2022-12-22 DIAGNOSIS — M5136 Other intervertebral disc degeneration, lumbar region: Secondary | ICD-10-CM | POA: Diagnosis not present

## 2022-12-22 DIAGNOSIS — I5042 Chronic combined systolic (congestive) and diastolic (congestive) heart failure: Secondary | ICD-10-CM | POA: Diagnosis not present

## 2022-12-22 DIAGNOSIS — Z48812 Encounter for surgical aftercare following surgery on the circulatory system: Secondary | ICD-10-CM | POA: Diagnosis not present

## 2022-12-22 DIAGNOSIS — I110 Hypertensive heart disease with heart failure: Secondary | ICD-10-CM | POA: Diagnosis not present

## 2022-12-22 DIAGNOSIS — G4700 Insomnia, unspecified: Secondary | ICD-10-CM | POA: Diagnosis not present

## 2022-12-22 DIAGNOSIS — N179 Acute kidney failure, unspecified: Secondary | ICD-10-CM | POA: Diagnosis not present

## 2022-12-22 DIAGNOSIS — I272 Pulmonary hypertension, unspecified: Secondary | ICD-10-CM | POA: Diagnosis not present

## 2022-12-22 DIAGNOSIS — E1142 Type 2 diabetes mellitus with diabetic polyneuropathy: Secondary | ICD-10-CM | POA: Diagnosis not present

## 2022-12-22 DIAGNOSIS — I421 Obstructive hypertrophic cardiomyopathy: Secondary | ICD-10-CM | POA: Diagnosis not present

## 2022-12-22 DIAGNOSIS — M4810 Ankylosing hyperostosis [Forestier], site unspecified: Secondary | ICD-10-CM | POA: Diagnosis not present

## 2022-12-22 DIAGNOSIS — I2510 Atherosclerotic heart disease of native coronary artery without angina pectoris: Secondary | ICD-10-CM | POA: Diagnosis not present

## 2022-12-22 DIAGNOSIS — J4520 Mild intermittent asthma, uncomplicated: Secondary | ICD-10-CM | POA: Diagnosis not present

## 2022-12-25 DIAGNOSIS — I421 Obstructive hypertrophic cardiomyopathy: Secondary | ICD-10-CM | POA: Diagnosis not present

## 2022-12-25 DIAGNOSIS — G4733 Obstructive sleep apnea (adult) (pediatric): Secondary | ICD-10-CM | POA: Diagnosis not present

## 2022-12-25 DIAGNOSIS — E1142 Type 2 diabetes mellitus with diabetic polyneuropathy: Secondary | ICD-10-CM | POA: Diagnosis not present

## 2022-12-25 DIAGNOSIS — I110 Hypertensive heart disease with heart failure: Secondary | ICD-10-CM | POA: Diagnosis not present

## 2022-12-25 DIAGNOSIS — N179 Acute kidney failure, unspecified: Secondary | ICD-10-CM | POA: Diagnosis not present

## 2022-12-25 DIAGNOSIS — I5042 Chronic combined systolic (congestive) and diastolic (congestive) heart failure: Secondary | ICD-10-CM | POA: Diagnosis not present

## 2022-12-25 DIAGNOSIS — Z48812 Encounter for surgical aftercare following surgery on the circulatory system: Secondary | ICD-10-CM | POA: Diagnosis not present

## 2022-12-25 DIAGNOSIS — G4700 Insomnia, unspecified: Secondary | ICD-10-CM | POA: Diagnosis not present

## 2022-12-25 DIAGNOSIS — G8929 Other chronic pain: Secondary | ICD-10-CM | POA: Diagnosis not present

## 2022-12-25 DIAGNOSIS — J4520 Mild intermittent asthma, uncomplicated: Secondary | ICD-10-CM | POA: Diagnosis not present

## 2022-12-25 DIAGNOSIS — M4810 Ankylosing hyperostosis [Forestier], site unspecified: Secondary | ICD-10-CM | POA: Diagnosis not present

## 2022-12-25 DIAGNOSIS — I272 Pulmonary hypertension, unspecified: Secondary | ICD-10-CM | POA: Diagnosis not present

## 2022-12-25 DIAGNOSIS — I2510 Atherosclerotic heart disease of native coronary artery without angina pectoris: Secondary | ICD-10-CM | POA: Diagnosis not present

## 2022-12-25 DIAGNOSIS — D649 Anemia, unspecified: Secondary | ICD-10-CM | POA: Diagnosis not present

## 2022-12-25 DIAGNOSIS — M5136 Other intervertebral disc degeneration, lumbar region: Secondary | ICD-10-CM | POA: Diagnosis not present

## 2022-12-28 ENCOUNTER — Telehealth: Payer: Self-pay | Admitting: Nurse Practitioner

## 2022-12-28 NOTE — Telephone Encounter (Signed)
Pt was returning call regarding Provider being out of office on 8/14 so he needed to r/s. Next available appts were offered to pt but he declined being that he stated he needed to be seen by next week because this was a f/u appt after having open heart surgery. Please would like to speak with nurse. Please advise

## 2022-12-30 ENCOUNTER — Ambulatory Visit: Payer: BLUE CROSS/BLUE SHIELD | Admitting: Nurse Practitioner

## 2022-12-30 DIAGNOSIS — M5136 Other intervertebral disc degeneration, lumbar region: Secondary | ICD-10-CM | POA: Diagnosis not present

## 2022-12-30 DIAGNOSIS — Z48812 Encounter for surgical aftercare following surgery on the circulatory system: Secondary | ICD-10-CM | POA: Diagnosis not present

## 2022-12-30 DIAGNOSIS — D649 Anemia, unspecified: Secondary | ICD-10-CM | POA: Diagnosis not present

## 2022-12-30 DIAGNOSIS — E1142 Type 2 diabetes mellitus with diabetic polyneuropathy: Secondary | ICD-10-CM | POA: Diagnosis not present

## 2022-12-30 DIAGNOSIS — I272 Pulmonary hypertension, unspecified: Secondary | ICD-10-CM | POA: Diagnosis not present

## 2022-12-30 DIAGNOSIS — J4520 Mild intermittent asthma, uncomplicated: Secondary | ICD-10-CM | POA: Diagnosis not present

## 2022-12-30 DIAGNOSIS — G8929 Other chronic pain: Secondary | ICD-10-CM | POA: Diagnosis not present

## 2022-12-30 DIAGNOSIS — I2510 Atherosclerotic heart disease of native coronary artery without angina pectoris: Secondary | ICD-10-CM | POA: Diagnosis not present

## 2022-12-30 DIAGNOSIS — G4733 Obstructive sleep apnea (adult) (pediatric): Secondary | ICD-10-CM | POA: Diagnosis not present

## 2022-12-30 DIAGNOSIS — M4810 Ankylosing hyperostosis [Forestier], site unspecified: Secondary | ICD-10-CM | POA: Diagnosis not present

## 2022-12-30 DIAGNOSIS — I5042 Chronic combined systolic (congestive) and diastolic (congestive) heart failure: Secondary | ICD-10-CM | POA: Diagnosis not present

## 2022-12-30 DIAGNOSIS — I110 Hypertensive heart disease with heart failure: Secondary | ICD-10-CM | POA: Diagnosis not present

## 2022-12-30 DIAGNOSIS — N179 Acute kidney failure, unspecified: Secondary | ICD-10-CM | POA: Diagnosis not present

## 2022-12-30 DIAGNOSIS — G4700 Insomnia, unspecified: Secondary | ICD-10-CM | POA: Diagnosis not present

## 2022-12-30 DIAGNOSIS — I421 Obstructive hypertrophic cardiomyopathy: Secondary | ICD-10-CM | POA: Diagnosis not present

## 2023-01-04 ENCOUNTER — Ambulatory Visit: Payer: BLUE CROSS/BLUE SHIELD | Admitting: Physician Assistant

## 2023-01-04 DIAGNOSIS — G4733 Obstructive sleep apnea (adult) (pediatric): Secondary | ICD-10-CM | POA: Diagnosis not present

## 2023-01-06 DIAGNOSIS — G4733 Obstructive sleep apnea (adult) (pediatric): Secondary | ICD-10-CM | POA: Diagnosis not present

## 2023-01-08 DIAGNOSIS — D649 Anemia, unspecified: Secondary | ICD-10-CM | POA: Diagnosis not present

## 2023-01-08 DIAGNOSIS — G4700 Insomnia, unspecified: Secondary | ICD-10-CM | POA: Diagnosis not present

## 2023-01-08 DIAGNOSIS — I110 Hypertensive heart disease with heart failure: Secondary | ICD-10-CM | POA: Diagnosis not present

## 2023-01-08 DIAGNOSIS — G8929 Other chronic pain: Secondary | ICD-10-CM | POA: Diagnosis not present

## 2023-01-08 DIAGNOSIS — M5136 Other intervertebral disc degeneration, lumbar region: Secondary | ICD-10-CM | POA: Diagnosis not present

## 2023-01-08 DIAGNOSIS — I421 Obstructive hypertrophic cardiomyopathy: Secondary | ICD-10-CM | POA: Diagnosis not present

## 2023-01-08 DIAGNOSIS — Z48812 Encounter for surgical aftercare following surgery on the circulatory system: Secondary | ICD-10-CM | POA: Diagnosis not present

## 2023-01-08 DIAGNOSIS — I272 Pulmonary hypertension, unspecified: Secondary | ICD-10-CM | POA: Diagnosis not present

## 2023-01-08 DIAGNOSIS — I5042 Chronic combined systolic (congestive) and diastolic (congestive) heart failure: Secondary | ICD-10-CM | POA: Diagnosis not present

## 2023-01-08 DIAGNOSIS — M4810 Ankylosing hyperostosis [Forestier], site unspecified: Secondary | ICD-10-CM | POA: Diagnosis not present

## 2023-01-08 DIAGNOSIS — J4520 Mild intermittent asthma, uncomplicated: Secondary | ICD-10-CM | POA: Diagnosis not present

## 2023-01-08 DIAGNOSIS — I2510 Atherosclerotic heart disease of native coronary artery without angina pectoris: Secondary | ICD-10-CM | POA: Diagnosis not present

## 2023-01-08 DIAGNOSIS — G4733 Obstructive sleep apnea (adult) (pediatric): Secondary | ICD-10-CM | POA: Diagnosis not present

## 2023-01-08 DIAGNOSIS — E1142 Type 2 diabetes mellitus with diabetic polyneuropathy: Secondary | ICD-10-CM | POA: Diagnosis not present

## 2023-01-08 DIAGNOSIS — N179 Acute kidney failure, unspecified: Secondary | ICD-10-CM | POA: Diagnosis not present

## 2023-01-12 DIAGNOSIS — J4520 Mild intermittent asthma, uncomplicated: Secondary | ICD-10-CM | POA: Diagnosis not present

## 2023-01-12 DIAGNOSIS — I2510 Atherosclerotic heart disease of native coronary artery without angina pectoris: Secondary | ICD-10-CM | POA: Diagnosis not present

## 2023-01-12 DIAGNOSIS — M5136 Other intervertebral disc degeneration, lumbar region: Secondary | ICD-10-CM | POA: Diagnosis not present

## 2023-01-12 DIAGNOSIS — Z48812 Encounter for surgical aftercare following surgery on the circulatory system: Secondary | ICD-10-CM | POA: Diagnosis not present

## 2023-01-12 DIAGNOSIS — D649 Anemia, unspecified: Secondary | ICD-10-CM | POA: Diagnosis not present

## 2023-01-12 DIAGNOSIS — G4700 Insomnia, unspecified: Secondary | ICD-10-CM | POA: Diagnosis not present

## 2023-01-12 DIAGNOSIS — I272 Pulmonary hypertension, unspecified: Secondary | ICD-10-CM | POA: Diagnosis not present

## 2023-01-12 DIAGNOSIS — G8929 Other chronic pain: Secondary | ICD-10-CM | POA: Diagnosis not present

## 2023-01-12 DIAGNOSIS — I5042 Chronic combined systolic (congestive) and diastolic (congestive) heart failure: Secondary | ICD-10-CM | POA: Diagnosis not present

## 2023-01-12 DIAGNOSIS — G4733 Obstructive sleep apnea (adult) (pediatric): Secondary | ICD-10-CM | POA: Diagnosis not present

## 2023-01-12 DIAGNOSIS — N179 Acute kidney failure, unspecified: Secondary | ICD-10-CM | POA: Diagnosis not present

## 2023-01-12 DIAGNOSIS — E1142 Type 2 diabetes mellitus with diabetic polyneuropathy: Secondary | ICD-10-CM | POA: Diagnosis not present

## 2023-01-12 DIAGNOSIS — M4810 Ankylosing hyperostosis [Forestier], site unspecified: Secondary | ICD-10-CM | POA: Diagnosis not present

## 2023-01-12 DIAGNOSIS — I421 Obstructive hypertrophic cardiomyopathy: Secondary | ICD-10-CM | POA: Diagnosis not present

## 2023-01-12 DIAGNOSIS — I110 Hypertensive heart disease with heart failure: Secondary | ICD-10-CM | POA: Diagnosis not present

## 2023-01-14 DIAGNOSIS — N179 Acute kidney failure, unspecified: Secondary | ICD-10-CM | POA: Diagnosis not present

## 2023-01-14 DIAGNOSIS — G8929 Other chronic pain: Secondary | ICD-10-CM | POA: Diagnosis not present

## 2023-01-14 DIAGNOSIS — I5042 Chronic combined systolic (congestive) and diastolic (congestive) heart failure: Secondary | ICD-10-CM | POA: Diagnosis not present

## 2023-01-14 DIAGNOSIS — Z48812 Encounter for surgical aftercare following surgery on the circulatory system: Secondary | ICD-10-CM | POA: Diagnosis not present

## 2023-01-14 DIAGNOSIS — J4520 Mild intermittent asthma, uncomplicated: Secondary | ICD-10-CM | POA: Diagnosis not present

## 2023-01-14 DIAGNOSIS — I2510 Atherosclerotic heart disease of native coronary artery without angina pectoris: Secondary | ICD-10-CM | POA: Diagnosis not present

## 2023-01-14 DIAGNOSIS — D649 Anemia, unspecified: Secondary | ICD-10-CM | POA: Diagnosis not present

## 2023-01-14 DIAGNOSIS — I272 Pulmonary hypertension, unspecified: Secondary | ICD-10-CM | POA: Diagnosis not present

## 2023-01-14 DIAGNOSIS — G4700 Insomnia, unspecified: Secondary | ICD-10-CM | POA: Diagnosis not present

## 2023-01-14 DIAGNOSIS — G4733 Obstructive sleep apnea (adult) (pediatric): Secondary | ICD-10-CM | POA: Diagnosis not present

## 2023-01-14 DIAGNOSIS — M5136 Other intervertebral disc degeneration, lumbar region: Secondary | ICD-10-CM | POA: Diagnosis not present

## 2023-01-14 DIAGNOSIS — E1142 Type 2 diabetes mellitus with diabetic polyneuropathy: Secondary | ICD-10-CM | POA: Diagnosis not present

## 2023-01-14 DIAGNOSIS — M4810 Ankylosing hyperostosis [Forestier], site unspecified: Secondary | ICD-10-CM | POA: Diagnosis not present

## 2023-01-14 DIAGNOSIS — I110 Hypertensive heart disease with heart failure: Secondary | ICD-10-CM | POA: Diagnosis not present

## 2023-01-14 DIAGNOSIS — I421 Obstructive hypertrophic cardiomyopathy: Secondary | ICD-10-CM | POA: Diagnosis not present

## 2023-01-15 ENCOUNTER — Ambulatory Visit: Payer: BLUE CROSS/BLUE SHIELD | Attending: Nurse Practitioner | Admitting: Cardiovascular Disease

## 2023-01-15 ENCOUNTER — Telehealth: Payer: Self-pay | Admitting: *Deleted

## 2023-01-15 ENCOUNTER — Encounter: Payer: Self-pay | Admitting: Cardiovascular Disease

## 2023-01-15 VITALS — BP 122/98 | HR 93 | Ht 68.0 in | Wt 300.4 lb

## 2023-01-15 DIAGNOSIS — I2510 Atherosclerotic heart disease of native coronary artery without angina pectoris: Secondary | ICD-10-CM | POA: Diagnosis not present

## 2023-01-15 DIAGNOSIS — E785 Hyperlipidemia, unspecified: Secondary | ICD-10-CM

## 2023-01-15 DIAGNOSIS — I350 Nonrheumatic aortic (valve) stenosis: Secondary | ICD-10-CM | POA: Diagnosis not present

## 2023-01-15 DIAGNOSIS — I10 Essential (primary) hypertension: Secondary | ICD-10-CM | POA: Diagnosis not present

## 2023-01-15 MED ORDER — FUROSEMIDE 20 MG PO TABS: 20.0000 mg | ORAL_TABLET | Freq: Every day | ORAL | 0 refills | Status: AC | PRN

## 2023-01-15 NOTE — Patient Instructions (Signed)
Medication Instructions:  Furosemide: Take one tablet daily as needed  *If you need a refill on your cardiac medications before your next appointment, please call your pharmacy*   Lab Work: None ordered If you have labs (blood work) drawn today and your tests are completely normal, you will receive your results only by: MyChart Message (if you have MyChart) OR A paper copy in the mail If you have any lab test that is abnormal or we need to change your treatment, we will call you to review the results.   Testing/Procedures: None ordered   Follow-Up: At [facility], you and your health needs are our priority.  As part of our continuing mission to provide you with exceptional heart care, we have created designated Provider Care Teams.  These Care Teams include your primary Cardiologist (physician) and Advanced Practice Providers (APPs -  Physician Assistants and Nurse Practitioners) who all work together to provide you with the care you need, when you need it.  We recommend signing up for the patient portal called "MyChart".  Sign up information is provided on this After Visit Summary.  MyChart is used to connect with patients for Virtual Visits (Telemedicine).  Patients are able to view lab/test results, encounter notes, upcoming appointments, etc.  Non-urgent messages can be sent to your provider as well.   To learn more about what you can do with MyChart, go to [email or web address].    Your next appointment:   4 month(s)  Provider:   You may see Lorine Bears, MD or one of the following Advanced Practice Providers on your designated Care Team:   Nicolasa Ducking, NP Eula Listen, PA-C Cadence Fransico Michael, PA-C Charlsie Quest, NP

## 2023-01-15 NOTE — Progress Notes (Signed)
Cardiology Office Note   Date:  01/15/2023   ID:  Brett Marshall, Brett Marshall [birth year], MRN [id number]  PCP:  Ethelda Chick, MD  Cardiologist:   Lorine Bears, MD   Chief Complaint  Patient presents with   Follow-up    Patient has heart flutter feeling.  Woke up this morning with dry white patches on hands.  Brings in a return to work form that needs to be completed and submitted.     History of Present Illness: Brett Marshall is a 57 y.o. male who is here today for a follow-up visit regarding coronary artery disease, aortic valve disease and  hypertrophic cardiomyopathy.   The patient has multiple chronic medical conditions that include type 2 diabetes, essential hypertension, morbid obesity, obstructive sleep apnea on CPAP and degenerative disc disease.   Right and left cardiac catheterization done in April of 2019 showed mild nonobstructive coronary artery disease, mildly reduced LV systolic function with an EF of 45 to 50% with global hypokinesis.  Right heart catheterization showed moderately elevated filling pressures with a wedge pressure of 22 mmHg, mild pulmonary hypertension and high cardiac output.    He had a repeat echocardiogram done in September 2021 which showed an EF of 60 to 65% with grade 1 diastolic dysfunction.  He was hospitalized in April, 2023 with unstable angina.  Lexiscan Myoview showed evidence of anterior wall ischemia.  I proceeded with cardiac catheterization via the right ulnar artery which showed 95% ostial LAD stenosis due to plaque rupture and moderate proximal RCA disease that was stable from before.  LVEDP was mildly elevated.  There was moderate LVOT/aortic gradient of 25 to 30 mmHg.  I performed successful IVUS guided PCI and drug-eluting stent placement to the ostial LAD.    He had recurrent episodes of chest pain since then on multiple cardiac catheterization showed patent LAD stent. He was noted to have progressive LVOT and aortic valve gradient.   Transesophageal echocardiogram showed moderate basal septal hypertrophy likely with bicuspid aortic valve with moderate to severe restriction in opening.  Cardiac catheterization in June of this year showed widely patent LAD stent with no significant restenosis.  Right heart catheterization showed mildly elevated filling pressures and mild pulmonary hypertension.  There was severe aortic stenosis with mean gradient of 31 mmHg.  The patient underwent aortic root enlargement procedure with a dacryon patch, aortic valve replacement with a bioprosthetic valve, resection of subaortic membrane and septal myomectomy on June 14 by Dr. Leafy Ro.  He had a tough postoperative recovery due to deconditioning and shortness of breath.  He also had significant chest wall tenderness.  He is significantly better now.  He is finishing home physical therapy.  He wants to resume his work in mid September.  He works from home for [employer].    Past Medical History:  Diagnosis Date   Asthma    CAD (coronary artery disease)    a. 08/2017 Cath: Diff nonobs dzs, EF 45-50%, diff HK; b. 08/2021 MV: Ant/apical ishemia; c. 08/2021 PCI: LAD 95ost (4.0x15 Onyx Frontier DES), EF 55-65%; c. 09/2021 Cath: LM nl, LAD patent stent, 75m, 30d, RI min irregs, LCX min irregs, RCA 50p. LVOT grad 50-83mmHg (rest), 70-137mmHg (provoked); d. 10/2022 Cath: LM nl, LAD 65m, 60d, RI min irregs, LCX min irregs, RCA 50p.   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 08/2017 LV gram: EF 45-50%, glob HK; b. 08/2021 Echo: EF 60-65%, no rwma, GrI DD, nl RV fxn; c. 09/2021 cMRI: EF 60%, no LVOT obstruction/gradient. Mild conc LVH, mild AS. No myocardial scar/fibrosis. No evidence of HCM.   Hyperlipidemia    Leg swelling    Left    Low back pain    Mixed ICM & NICM    a. 08/2017 LV gram: EF 45-50%, glob HK; b. 08/2021 Echo: EF 60-65%.   Morbid obesity (HCC)    OSA on CPAP    non compliant   Pulmonary hypertension (HCC)    Severe aortic stenosis    a. 10/2022 Cath: 31.87mmHg gradient and AVA 1.05cm^2; b. 10/2022 s/p 23mm Edwards Resilia bioprosthetic AoV, resection of sub-aortic membrane, and septal myectomy; c. 10/2022 Echo: EF 65-70%, no rwma, nl AoV structure/fxn.    Past Surgical History:  Procedure Laterality Date   AORTIC ROOT ENLARGEMENT N/A 10/30/2022   Procedure: AORTIC ROOT ENLARGEMENT AND SUBAORTIC MYOMECTOMY;  Surgeon: Eugenio Hoes, MD;  Location: [facility] OR;  Service: Open Heart Surgery;  Laterality: N/A;   AORTIC VALVE REPLACEMENT N/A 10/30/2022   Procedure: AORTIC VALVE REPLACEMENT (AVR) USING INSPIRIS RESILIA AORTIC VALVE;  Surgeon: Eugenio Hoes, MD;  Location: [facility] OR;  Service: Open Heart Surgery;  Laterality: N/A;  Median sternotomy   CARDIAC CATHETERIZATION     [place] no stents.   COLONOSCOPY WITH PROPOFOL N/A 04/11/2019   Procedure: COLONOSCOPY WITH PROPOFOL;  Surgeon: Midge Minium, MD;  Location: [facility] ENDOSCOPY;  Service: Endoscopy;  Laterality: N/A;   CORONARY STENT INTERVENTION N/A 09/08/2021   Procedure: CORONARY STENT INTERVENTION;  Surgeon: [name], MD;  Location: [facility] INVASIVE CV LAB;  Service: Cardiovascular;  Laterality: N/A;   CORONARY ULTRASOUND/IVUS N/A 09/08/2021   Procedure: Intravascular Ultrasound/IVUS;  Surgeon: [name], MD;  Location: [facility] INVASIVE CV LAB;  Service: Cardiovascular;  Laterality: N/A;   LEFT HEART CATH AND CORONARY ANGIOGRAPHY N/A 09/08/2021   Procedure: LEFT HEART CATH AND CORONARY ANGIOGRAPHY;  Surgeon: [name], MD;  Location: [facility] INVASIVE CV LAB;  Service: Cardiovascular;  Laterality: N/A;   LEFT HEART CATH AND CORONARY ANGIOGRAPHY N/A 09/17/2021   Procedure: LEFT HEART CATH AND CORONARY ANGIOGRAPHY;  Surgeon: Yvonne Kendall, MD;   Location: [facility] INVASIVE CV LAB;  Service: Cardiovascular;  Laterality: N/A;   LEFT HEART CATH AND CORONARY ANGIOGRAPHY N/A 09/08/2022   Procedure: LEFT HEART CATH AND CORONARY ANGIOGRAPHY;  Surgeon: Yvonne Kendall, MD;  Location: [facility] INVASIVE CV LAB;  Service: Cardiovascular;  Laterality: N/A;   RIGHT/LEFT HEART CATH AND CORONARY ANGIOGRAPHY N/A 09/02/2017   Procedure: RIGHT/LEFT HEART CATH AND CORONARY ANGIOGRAPHY;  Surgeon: [name], MD;  Location: [facility] INVASIVE CV LAB;  Service: Cardiovascular;  Laterality: N/A;   RIGHT/LEFT HEART CATH AND CORONARY ANGIOGRAPHY Bilateral 10/19/2022   Procedure: RIGHT/LEFT HEART CATH AND CORONARY ANGIOGRAPHY;  Surgeon: [name], MD;  Location: [facility] INVASIVE CV LAB;  Service: Cardiovascular;  Laterality: Bilateral;   TEE WITHOUT CARDIOVERSION N/A 09/10/2022   Procedure: TRANSESOPHAGEAL ECHOCARDIOGRAM;  Surgeon: Antonieta Iba, MD;  Location: [facility] ORS;  Service: Cardiovascular;  Laterality: N/A;   TEE WITHOUT CARDIOVERSION N/A 10/30/2022   Procedure: TRANSESOPHAGEAL ECHOCARDIOGRAM;  Surgeon: Eugenio Hoes, MD;  Location: [facility] OR;  Service: Open Heart Surgery;  Laterality: N/A;  Current Outpatient Medications  Medication Sig Dispense Refill   acetaminophen (TYLENOL) 325 MG tablet Take 650 mg by mouth every 6 (six) hours as needed for moderate pain.     albuterol (VENTOLIN HFA) 108 (90 Base) MCG/ACT inhaler Inhale 1-2 puffs into the lungs every 6 (six) hours as needed for wheezing or shortness of breath. 18 g 1   aspirin EC 325 MG tablet Take 1 tablet (325 mg total) by mouth daily.     atorvastatin (LIPITOR) 40 MG tablet Take 1 tablet (40 mg total) by mouth daily at 6 PM. 30 tablet 2   cholecalciferol (CHOLECALCIFEROL) 25 MCG tablet Take 1 tablet (1,000 Units total) by mouth daily. 30 tablet 0   cyanocobalamin (VITAMIN B12) 1000 MCG tablet Take 1 tablet (1,000 mcg total) by mouth daily.     cyclobenzaprine (FLEXERIL) 10 MG tablet Take 1 tablet  (10 mg total) by mouth 3 (three) times daily as needed.For spasms (Patient taking differently: Take 30 mg by mouth daily as needed for muscle spasms. For spasms) 90 tablet 5   DULoxetine (CYMBALTA) 60 MG capsule Take 120 mg by mouth at bedtime.     famotidine (PEPCID) 40 MG tablet Take 40 mg by mouth at bedtime.     ferrous sulfate 325 (65 FE) MG EC tablet Take 1 tablet (325 mg total) by mouth daily with breakfast. 30 tablet 0   fluticasone-salmeterol (ADVAIR) 250-50 MCG/ACT AEPB Inhale 1 puff into the lungs every 12 (twelve) hours. 60 each 6   furosemide (LASIX) 20 MG tablet Take 1 tablet (20 mg total) by mouth daily. 90 tablet 3   gabapentin (NEURONTIN) 300 MG capsule Take 300 mg by mouth at bedtime.     glucose blood (GE100 BLOOD GLUCOSE TEST) test strip Use as instructed 100 each 12   ibuprofen (MOTRIN IB) 200 MG tablet Take 2 tablets (400 mg total) by mouth 3 (three) times daily. 100 tablet 2   insulin glargine (LANTUS) 100 unit/mL SOPN Inject 30 Units into the skin daily. 15 mL 11   JARDIANCE 25 MG TABS tablet Take 25 mg by mouth daily.     Lancets (ONETOUCH ULTRASOFT) lancets Check sugar twice daily 100 each 12   lidocaine (LIDODERM) 5 % Place 1 patch onto the skin daily. Remove & Discard patch within 12 hours or as directed by MD. May place on right or left side of chest (Patient taking differently: Place 1 patch onto the skin daily as needed (pain). Remove & Discard patch within 12 hours or as directed by MD. May place on right or left side of chest) 14 patch 0   metFORMIN (GLUCOPHAGE) 1000 MG tablet Take 1 tablet (1,000 mg total) by mouth 2 (two) times daily with a meal. 180 tablet 1   metoprolol tartrate (LOPRESSOR) 25 MG tablet Take 0.5 tablets (12.5 mg total) by mouth 2 (two) times daily. 30 tablet 2   traZODone (DESYREL) 50 MG tablet Take 50 mg by mouth at bedtime.     fluticasone (FLONASE) 50 MCG/ACT nasal spray Place 1 spray into both nostrils daily as needed for allergies or  rhinitis.     No current facility-administered medications for this visit.    Allergies:   Patient has no known allergies.    Social History:  The patient  reports that he has never smoked. He has never used smokeless tobacco. He reports that he does not currently use alcohol. He reports that he does not use drugs.   Family History:  The patient's family history includes Asthma in his mother; Diabetes in his mother; Heart attack in his father; Heart disease in his father; Heart disease (age of onset: 73) in his brother; Sjogren's syndrome in his sister.    ROS:  Please see the history of present illness.   Otherwise, review of systems are positive for none.   All other systems are reviewed and negative.   PHYSICAL EXAM: VS:  BP (!) 122/98 (BP Location: Left Arm, Patient Position: Sitting, Cuff Size: Large)   Pulse 93   Ht 5\' 8"  (1.727 m)   Wt (!) 300 lb 6.4 oz (136.3 kg)   SpO2 99%   BMI 45.68 kg/m  , BMI Body mass index is 45.68 kg/m. GEN: Well nourished, well developed, in no acute distress  HEENT: normal  Neck: no JVD or masses.  No carotid bruits. Cardiac: RRR; norubs, or gallops.  No murmurs.  No lower extremity edema. Respiratory:  clear to auscultation bilaterally, normal work of breathing GI: soft, nontender, nondistended, + BS MS: no deformity or atrophy  Skin: warm and dry, no rash Neuro:  Strength and sensation are intact Psych: euthymic mood, full affect  EKG:  EKG is ordered today. The ekg ordered today demonstrates :  Normal sinus rhythm Possible Left atrial enlargement When compared with ECG of 26-Nov-2022 08:26, Nonspecific T wave abnormality no longer evident in Inferior leads Nonspecific T wave abnormality no longer evident in Lateral leads     Recent Labs: 11/12/2022: TSH 7.559 11/18/2022: B Natriuretic Peptide 210.8 11/22/2022: ALT 19; Hemoglobin 8.2; Magnesium 2.0; Platelets 436 12/10/2022: BUN 16; Creatinine, Ser 1.01; Potassium 3.6; Sodium 137     Lipid Panel    Component Value Date/Time   CHOL 109 09/08/2022 0453   CHOL 176 12/08/2017 1150   TRIG 138 09/08/2022 0453   HDL 31 (L) 09/08/2022 0453   HDL 44 12/08/2017 1150   CHOLHDL 3.5 09/08/2022 0453   VLDL 28 09/08/2022 0453   LDLCALC 50 09/08/2022 0453   LDLCALC 103 (H) 12/08/2017 1150      Wt Readings from Last 3 Encounters:  01/15/23 (!) 300 lb 6.4 oz (136.3 kg)  12/07/22 (!) 303 lb (137.4 kg)  11/26/22 (!) 318 lb 9.6 oz (144.5 kg)        08/27/2017   11:43 AM  PAD Screen  Previous PAD dx? No  Previous surgical procedure? No  Pain with walking? Yes  Subsides with rest? No  Feet/toe relief with dangling? Yes  Painful, non-healing ulcers? No  Extremities discolored? Yes      ASSESSMENT AND PLAN:  1.  Coronary artery disease involving native coronary arteries without angina: He is doing well overall with no anginal symptoms.  Most recent cardiac catheterization showed widely patent LAD stent.  Continue small dose aspirin.  2.  Bicuspid aortic valve disease with basal septal hypertrophy: Status post bioprosthetic aortic valve replacement and myomectomy with resection of a small subaortic membrane.  He is recovering very well at this point.  Recommend lifelong antibiotic prophylaxis before dental procedures. He is going to start cardiac rehab in the near future. He can use furosemide as needed now instead of daily. I provided him with a letter that he can resume work in mid September.  3.   Essential hypertension: Blood pressure is controlled.   4.  Hyperlipidemia: Continue atorvastatin with a target LDL of less than 70.  5.  Morbid obesity: Continue healthy lifestyle changes.  I discussed with him the importance of  regular exercise and healthy diet.    Disposition:   Follow-up in 4 months.  Signed,  Lorine Bears, MD  01/15/2023 10:19 AM     [facility]

## 2023-01-15 NOTE — Telephone Encounter (Signed)
Paperwork for [employer] has been faxed to [phone] and paperwork sent to be scanned.

## 2023-01-19 DIAGNOSIS — M4810 Ankylosing hyperostosis [Forestier], site unspecified: Secondary | ICD-10-CM | POA: Diagnosis not present

## 2023-01-19 DIAGNOSIS — G8929 Other chronic pain: Secondary | ICD-10-CM | POA: Diagnosis not present

## 2023-01-19 DIAGNOSIS — J4520 Mild intermittent asthma, uncomplicated: Secondary | ICD-10-CM | POA: Diagnosis not present

## 2023-01-19 DIAGNOSIS — I421 Obstructive hypertrophic cardiomyopathy: Secondary | ICD-10-CM | POA: Diagnosis not present

## 2023-01-19 DIAGNOSIS — D649 Anemia, unspecified: Secondary | ICD-10-CM | POA: Diagnosis not present

## 2023-01-19 DIAGNOSIS — Z48812 Encounter for surgical aftercare following surgery on the circulatory system: Secondary | ICD-10-CM | POA: Diagnosis not present

## 2023-01-19 DIAGNOSIS — I110 Hypertensive heart disease with heart failure: Secondary | ICD-10-CM | POA: Diagnosis not present

## 2023-01-19 DIAGNOSIS — I2510 Atherosclerotic heart disease of native coronary artery without angina pectoris: Secondary | ICD-10-CM | POA: Diagnosis not present

## 2023-01-19 DIAGNOSIS — M5136 Other intervertebral disc degeneration, lumbar region: Secondary | ICD-10-CM | POA: Diagnosis not present

## 2023-01-19 DIAGNOSIS — I272 Pulmonary hypertension, unspecified: Secondary | ICD-10-CM | POA: Diagnosis not present

## 2023-01-19 DIAGNOSIS — E1142 Type 2 diabetes mellitus with diabetic polyneuropathy: Secondary | ICD-10-CM | POA: Diagnosis not present

## 2023-01-19 DIAGNOSIS — G4700 Insomnia, unspecified: Secondary | ICD-10-CM | POA: Diagnosis not present

## 2023-01-19 DIAGNOSIS — N179 Acute kidney failure, unspecified: Secondary | ICD-10-CM | POA: Diagnosis not present

## 2023-01-19 DIAGNOSIS — I5042 Chronic combined systolic (congestive) and diastolic (congestive) heart failure: Secondary | ICD-10-CM | POA: Diagnosis not present

## 2023-01-19 DIAGNOSIS — G4733 Obstructive sleep apnea (adult) (pediatric): Secondary | ICD-10-CM | POA: Diagnosis not present

## 2023-01-20 DIAGNOSIS — Z48812 Encounter for surgical aftercare following surgery on the circulatory system: Secondary | ICD-10-CM | POA: Diagnosis not present

## 2023-01-20 DIAGNOSIS — I272 Pulmonary hypertension, unspecified: Secondary | ICD-10-CM | POA: Diagnosis not present

## 2023-01-20 DIAGNOSIS — E1142 Type 2 diabetes mellitus with diabetic polyneuropathy: Secondary | ICD-10-CM | POA: Diagnosis not present

## 2023-01-20 DIAGNOSIS — M4810 Ankylosing hyperostosis [Forestier], site unspecified: Secondary | ICD-10-CM | POA: Diagnosis not present

## 2023-01-20 DIAGNOSIS — I2510 Atherosclerotic heart disease of native coronary artery without angina pectoris: Secondary | ICD-10-CM | POA: Diagnosis not present

## 2023-01-20 DIAGNOSIS — I5042 Chronic combined systolic (congestive) and diastolic (congestive) heart failure: Secondary | ICD-10-CM | POA: Diagnosis not present

## 2023-01-20 DIAGNOSIS — I110 Hypertensive heart disease with heart failure: Secondary | ICD-10-CM | POA: Diagnosis not present

## 2023-01-20 DIAGNOSIS — I421 Obstructive hypertrophic cardiomyopathy: Secondary | ICD-10-CM | POA: Diagnosis not present

## 2023-01-20 DIAGNOSIS — G4733 Obstructive sleep apnea (adult) (pediatric): Secondary | ICD-10-CM | POA: Diagnosis not present

## 2023-01-20 DIAGNOSIS — J4520 Mild intermittent asthma, uncomplicated: Secondary | ICD-10-CM | POA: Diagnosis not present

## 2023-01-20 DIAGNOSIS — N179 Acute kidney failure, unspecified: Secondary | ICD-10-CM | POA: Diagnosis not present

## 2023-01-20 DIAGNOSIS — D649 Anemia, unspecified: Secondary | ICD-10-CM | POA: Diagnosis not present

## 2023-01-20 DIAGNOSIS — M5136 Other intervertebral disc degeneration, lumbar region: Secondary | ICD-10-CM | POA: Diagnosis not present

## 2023-01-20 DIAGNOSIS — G4700 Insomnia, unspecified: Secondary | ICD-10-CM | POA: Diagnosis not present

## 2023-01-20 DIAGNOSIS — G8929 Other chronic pain: Secondary | ICD-10-CM | POA: Diagnosis not present

## 2023-01-27 ENCOUNTER — Encounter: Payer: Self-pay | Admitting: Cardiovascular Disease

## 2023-01-29 ENCOUNTER — Ambulatory Visit: Payer: BLUE CROSS/BLUE SHIELD | Admitting: Nurse Practitioner

## 2023-02-02 ENCOUNTER — Encounter: Payer: BLUE CROSS/BLUE SHIELD | Attending: Cardiovascular Disease | Admitting: *Deleted

## 2023-02-02 DIAGNOSIS — Z952 Presence of prosthetic heart valve: Secondary | ICD-10-CM | POA: Insufficient documentation

## 2023-02-02 DIAGNOSIS — Z48812 Encounter for surgical aftercare following surgery on the circulatory system: Secondary | ICD-10-CM | POA: Insufficient documentation

## 2023-02-02 NOTE — Progress Notes (Signed)
Initial phone call completed. Diagnosis can be found in [facility] 6/14. EP Orientation scheduled for Wednesday 9/25 at 8am.

## 2023-02-03 ENCOUNTER — Telehealth: Payer: Self-pay | Admitting: Cardiovascular Disease

## 2023-02-03 NOTE — Telephone Encounter (Signed)
Left a message for the patient to call back.  

## 2023-02-03 NOTE — Telephone Encounter (Signed)
The patient stated that Brett Marshall never received the fax that was sent. This has been re-faxed with a confirmation. It has also been mailed again to the patient.

## 2023-02-03 NOTE — Telephone Encounter (Signed)
Pt is requesting a callback regarding a return to work form. He stated he never received it, his job nor the [employer]. He'd like to discuss further once called back. Please advise

## 2023-02-04 DIAGNOSIS — G4733 Obstructive sleep apnea (adult) (pediatric): Secondary | ICD-10-CM | POA: Diagnosis not present

## 2023-02-10 VITALS — Ht 69.7 in | Wt 300.2 lb

## 2023-02-10 DIAGNOSIS — Z952 Presence of prosthetic heart valve: Secondary | ICD-10-CM | POA: Diagnosis not present

## 2023-02-10 DIAGNOSIS — Z48812 Encounter for surgical aftercare following surgery on the circulatory system: Secondary | ICD-10-CM | POA: Diagnosis not present

## 2023-02-10 NOTE — Patient Instructions (Addendum)
Patient Instructions  Patient Details  Name: Brett Marshall MRN: [id number] Date of Birth: 07-05-1965 Referring Provider:  [name], MD  Below are your personal goals for exercise, nutrition, and risk factors. Our goal is to help you stay on track towards obtaining and maintaining these goals. We will be discussing your progress on these goals with you throughout the program.  Initial Exercise Prescription:  Initial Exercise Prescription - 02/10/23 1000       Date of Initial Exercise RX and Referring Provider   Date 02/10/23    Referring Provider Lorine Bears, MD      Oxygen   Maintain Oxygen Saturation 88% or higher      Treadmill   MPH 2    Grade 0    Minutes 15    METs 2.53      Recumbant Bike   Level 2    RPM 50    Watts 31    Minutes 15    METs 2.73      REL-XR   Level 2    Speed 50    Minutes 15    METs 2.73      T5 Nustep   Level 2    SPM 80    Minutes 15    METs 2.73      Biostep-RELP   Level 2    SPM 50    Minutes 15    METs 2.73      Prescription Details   Frequency (times per week) 2    Duration Progress to 30 minutes of continuous aerobic without signs/symptoms of physical distress      Intensity   THRR 40-80% of Max Heartrate 130-152    Ratings of Perceived Exertion 11-13    Perceived Dyspnea 0-4      Progression   Progression Continue to progress workloads to maintain intensity without signs/symptoms of physical distress.      Resistance Training   Training Prescription Yes    Weight 5 lb    Reps 10-15             Exercise Goals: Frequency: Be able to perform aerobic exercise two to three times per week in program working toward 2-5 days per week of home exercise.  Intensity: Work with a perceived exertion of 11 (fairly light) - 15 (hard) while following your exercise prescription.  We will make changes to your prescription with you as you progress through the program.   Duration: Be able to do 30 to 45 minutes  of continuous aerobic exercise in addition to a 5 minute warm-up and a 5 minute cool-down routine.   Nutrition Goals: Your personal nutrition goals will be established when you do your nutrition analysis with the dietician.  The following are general nutrition guidelines to follow: Cholesterol < 200mg /day Sodium < 1500mg /day Fiber: Men over 50 yrs - 30 grams per day  Personal Goals:  Personal Goals and Risk Factors at Admission - 02/10/23 1012       Core Components/Risk Factors/Patient Goals on Admission    Weight Management Yes;Obesity;Weight Loss    Intervention Weight Management: Develop a combined nutrition and exercise program designed to reach desired caloric intake, while maintaining appropriate intake of nutrient and fiber, sodium and fats, and appropriate energy expenditure required for the weight goal.;Weight Management/Obesity: Establish reasonable short term and long term weight goals.;Obesity: Provide education and appropriate resources to help participant work on and attain dietary goals.;Weight Management: Provide education and appropriate resources to help  participant work on and attain dietary goals.    Admit Weight 300 lb 3.2 oz (136.2 kg)    Goal Weight: Short Term 260 lb (117.9 kg)    Goal Weight: Long Term 210 lb (95.3 kg)    Expected Outcomes Short Term: Continue to assess and modify interventions until short term weight is achieved;Long Term: Adherence to nutrition and physical activity/exercise program aimed toward attainment of established weight goal;Weight Loss: Understanding of general recommendations for a balanced deficit meal plan, which promotes 1-2 lb weight loss per week and includes a negative energy balance of [phone] kcal/d;Understanding recommendations for meals to include 15-35% energy as protein, 25-35% energy from fat, 35-60% energy from carbohydrates, less than 200mg  of dietary cholesterol, 20-35 gm of total fiber daily;Understanding of distribution of  calorie intake throughout the day with the consumption of 4-5 meals/snacks    Diabetes Yes    Intervention Provide education about signs/symptoms and action to take for hypo/hyperglycemia.;Provide education about proper nutrition, including hydration, and aerobic/resistive exercise prescription along with prescribed medications to achieve blood glucose in normal ranges: Fasting glucose 65-99 mg/dL    Expected Outcomes Short Term: Participant verbalizes understanding of the signs/symptoms and immediate care of hyper/hypoglycemia, proper foot care and importance of medication, aerobic/resistive exercise and nutrition plan for blood glucose control.;Long Term: Attainment of HbA1C < 7%.    Heart Failure Yes    Intervention Provide a combined exercise and nutrition program that is supplemented with education, support and counseling about heart failure. Directed toward relieving symptoms such as shortness of breath, decreased exercise tolerance, and extremity edema.    Expected Outcomes Short term: Attendance in program 2-3 days a week with increased exercise capacity. Reported lower sodium intake. Reported increased fruit and vegetable intake. Reports medication compliance.;Improve functional capacity of life;Short term: Daily weights obtained and reported for increase. Utilizing diuretic protocols set by physician.;Long term: Adoption of self-care skills and reduction of barriers for early signs and symptoms recognition and intervention leading to self-care maintenance.    Hypertension Yes    Intervention Provide education on lifestyle modifcations including regular physical activity/exercise, weight management, moderate sodium restriction and increased consumption of fresh fruit, vegetables, and low fat dairy, alcohol moderation, and smoking cessation.;Monitor prescription use compliance.    Expected Outcomes Short Term: Continued assessment and intervention until BP is < 140/37mm HG in hypertensive  participants. < 130/20mm HG in hypertensive participants with diabetes, heart failure or chronic kidney disease.;Long Term: Maintenance of blood pressure at goal levels.    Lipids Yes    Intervention Provide education and support for participant on nutrition & aerobic/resistive exercise along with prescribed medications to achieve LDL 70mg , HDL >40mg .    Expected Outcomes Short Term: Participant states understanding of desired cholesterol values and is compliant with medications prescribed. Participant is following exercise prescription and nutrition guidelines.;Long Term: Cholesterol controlled with medications as prescribed, with individualized exercise RX and with personalized nutrition plan. Value goals: LDL < 70mg , HDL > 40 mg.             Tobacco Use Initial Evaluation: Social History   Tobacco Use  Smoking Status Never  Smokeless Tobacco Never    Exercise Goals and Review:  Exercise Goals     Row Name 02/10/23 1009             Exercise Goals   Increase Physical Activity Yes       Intervention Provide advice, education, support and counseling about physical activity/exercise needs.;Develop an individualized exercise prescription  for aerobic and resistive training based on initial evaluation findings, risk stratification, comorbidities and participant's personal goals.       Expected Outcomes Long Term: Add in home exercise to make exercise part of routine and to increase amount of physical activity.;Short Term: Attend rehab on a regular basis to increase amount of physical activity.;Long Term: Exercising regularly at least 3-5 days a week.       Increase Strength and Stamina Yes       Intervention Provide advice, education, support and counseling about physical activity/exercise needs.;Develop an individualized exercise prescription for aerobic and resistive training based on initial evaluation findings, risk stratification, comorbidities and participant's personal goals.        Expected Outcomes Short Term: Increase workloads from initial exercise prescription for resistance, speed, and METs.;Short Term: Perform resistance training exercises routinely during rehab and add in resistance training at home;Long Term: Improve cardiorespiratory fitness, muscular endurance and strength as measured by increased METs and functional capacity ( )       Able to understand and use rate of perceived exertion (RPE) scale Yes       Intervention Provide education and explanation on how to use RPE scale       Expected Outcomes Short Term: Able to use RPE daily in rehab to express subjective intensity level;Long Term:  Able to use RPE to guide intensity level when exercising independently       Able to understand and use Dyspnea scale Yes       Intervention Provide education and explanation on how to use Dyspnea scale       Expected Outcomes Short Term: Able to use Dyspnea scale daily in rehab to express subjective sense of shortness of breath during exertion;Long Term: Able to use Dyspnea scale to guide intensity level when exercising independently       Knowledge and understanding of Target Heart Rate Range (THRR) Yes       Intervention Provide education and explanation of THRR including how the numbers were predicted and where they are located for reference       Expected Outcomes Short Term: Able to state/look up THRR;Short Term: Able to use daily as guideline for intensity in rehab;Long Term: Able to use THRR to govern intensity when exercising independently       Able to check pulse independently Yes       Intervention Provide education and demonstration on how to check pulse in carotid and radial arteries.;Review the importance of being able to check your own pulse for safety during independent exercise       Expected Outcomes Short Term: Able to explain why pulse checking is important during independent exercise;Long Term: Able to check pulse independently and accurately        Understanding of Exercise Prescription Yes       Intervention Provide education, explanation, and written materials on patient's individual exercise prescription       Expected Outcomes Short Term: Able to explain program exercise prescription;Long Term: Able to explain home exercise prescription to exercise independently

## 2023-02-10 NOTE — Progress Notes (Signed)
Cardiac Individual Treatment Plan  Patient Details  Name: Brett Marshall MRN: [id number] Date of Birth: 1965/10/28 Referring Provider:   Flowsheet Row Cardiac Rehab from 02/10/2023 in [facility] Cardiac and Pulmonary Rehab  Referring Provider Lorine Bears, MD       Initial Encounter Date:  Flowsheet Row Cardiac Rehab from 02/10/2023 in [facility] Cardiac and Pulmonary Rehab  Date 02/10/23       Visit Diagnosis: S/P AVR (aortic valve replacement)  Patient's Home Medications on Admission:  Current Outpatient Medications:    acetaminophen (TYLENOL) 325 MG tablet, Take 650 mg by mouth every 6 (six) hours as needed for moderate pain., Disp: , Rfl:    albuterol (VENTOLIN HFA) 108 (90 Base) MCG/ACT inhaler, Inhale 1-2 puffs into the lungs every 6 (six) hours as needed for wheezing or shortness of breath., Disp: 18 g, Rfl: 1   aspirin EC 325 MG tablet, Take 1 tablet (325 mg total) by mouth daily., Disp: , Rfl:    atorvastatin (LIPITOR) 40 MG tablet, Take 1 tablet (40 mg total) by mouth daily at 6 PM., Disp: 30 tablet, Rfl: 2   cholecalciferol (CHOLECALCIFEROL) 25 MCG tablet, Take 1 tablet (1,000 Units total) by mouth daily., Disp: 30 tablet, Rfl: 0   cyanocobalamin (VITAMIN B12) 1000 MCG tablet, Take 1 tablet (1,000 mcg total) by mouth daily., Disp: , Rfl:    cyclobenzaprine (FLEXERIL) 10 MG tablet, Take 1 tablet (10 mg total) by mouth 3 (three) times daily as needed.For spasms (Patient taking differently: Take 30 mg by mouth daily as needed for muscle spasms. For spasms), Disp: 90 tablet, Rfl: 5   DULoxetine (CYMBALTA) 60 MG capsule, Take 120 mg by mouth at bedtime., Disp: , Rfl:    famotidine (PEPCID) 40 MG tablet, Take 40 mg by mouth at bedtime., Disp: , Rfl:    ferrous sulfate 325 (65 FE) MG EC tablet, Take 1 tablet (325 mg total) by mouth daily with breakfast., Disp: 30 tablet, Rfl: 0   fluticasone (FLONASE) 50 MCG/ACT nasal spray, Place 1 spray into both nostrils daily as needed for allergies or  rhinitis., Disp: , Rfl:    fluticasone-salmeterol (ADVAIR) 250-50 MCG/ACT AEPB, Inhale 1 puff into the lungs every 12 (twelve) hours., Disp: 60 each, Rfl: 6   furosemide (LASIX) 20 MG tablet, Take 1 tablet (20 mg total) by mouth daily as needed., Disp: 90 tablet, Rfl: 0   gabapentin (NEURONTIN) 300 MG capsule, Take 300 mg by mouth at bedtime., Disp: , Rfl:    glucose blood (GE100 BLOOD GLUCOSE TEST) test strip, Use as instructed, Disp: 100 each, Rfl: 12   ibuprofen (MOTRIN IB) 200 MG tablet, Take 2 tablets (400 mg total) by mouth 3 (three) times daily., Disp: 100 tablet, Rfl: 2   insulin glargine (LANTUS) 100 unit/mL SOPN, Inject 30 Units into the skin daily., Disp: 15 mL, Rfl: 11   JARDIANCE 25 MG TABS tablet, Take 25 mg by mouth daily., Disp: , Rfl:    Lancets (ONETOUCH ULTRASOFT) lancets, Check sugar twice daily, Disp: 100 each, Rfl: 12   lidocaine (LIDODERM) 5 %, Place 1 patch onto the skin daily. Remove & Discard patch within 12 hours or as directed by MD. May place on right or left side of chest (Patient taking differently: Place 1 patch onto the skin daily as needed (pain). Remove & Discard patch within 12 hours or as directed by MD. May place on right or left side of chest), Disp: 14 patch, Rfl: 0   metFORMIN (GLUCOPHAGE) 1000  MG tablet, Take 1 tablet (1,000 mg total) by mouth 2 (two) times daily with a meal., Disp: 180 tablet, Rfl: 1   metoprolol tartrate (LOPRESSOR) 25 MG tablet, Take 0.5 tablets (12.5 mg total) by mouth 2 (two) times daily., Disp: 30 tablet, Rfl: 2   traZODone (DESYREL) 50 MG tablet, Take 50 mg by mouth at bedtime., Disp: , Rfl:   Past Medical History: Past Medical History:  Diagnosis Date   Asthma    CAD (coronary artery disease)    a. 08/2017 Cath: Diff nonobs dzs, EF 45-50%, diff HK; b. 08/2021 MV: Ant/apical ishemia; c. 08/2021 PCI: LAD 95ost (4.0x15 Onyx Frontier DES), EF 55-65%; c. 09/2021 Cath: LM nl, LAD patent stent, 6m, 30d, RI min irregs, LCX min irregs, RCA  50p. LVOT grad 50-66mmHg (rest), 70-159mmHg (provoked); d. 10/2022 Cath: LM nl, LAD 40m, 60d, RI min irregs, LCX min irregs, RCA 50p.   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 08/2017 LV gram: EF 45-50%, glob HK; b. 08/2021 Echo: EF 60-65%, no rwma, GrI DD, nl RV fxn; c. 09/2021 cMRI: EF 60%, no LVOT obstruction/gradient. Mild conc LVH, mild AS. No myocardial scar/fibrosis. No evidence of HCM.   Hyperlipidemia    Leg swelling    Left   Low back pain    Mixed ICM & NICM    a. 08/2017 LV gram: EF 45-50%, glob HK; b. 08/2021 Echo: EF 60-65%.   Morbid obesity (HCC)    OSA on CPAP    non compliant   Pulmonary hypertension (HCC)    Severe aortic stenosis    a. 10/2022 Cath: 31.1mmHg gradient and AVA 1.05cm^2; b. 10/2022 s/p 23mm Edwards Resilia bioprosthetic AoV, resection of sub-aortic membrane, and septal myectomy; c. 10/2022 Echo: EF 65-70%, no rwma, nl AoV structure/fxn.    Tobacco Use: Social History   Tobacco Use  Smoking Status Never  Smokeless Tobacco Never    Labs: Review Flowsheet  More data exists      Latest Ref Rng & Units 09/07/2022 09/08/2022 10/19/2022 10/28/2022 10/30/2022  Labs for ITP Cardiac and Pulmonary Rehab  Cholestrol 0 - 200 mg/dL - 161  - - -  LDL (calc) 0 - 99 mg/dL - 50  - - -  HDL-C >09 mg/dL - 31  - - -  Trlycerides <150 mg/dL - 604  - - -  Hemoglobin A1c 4.8 - 5.6 % 11.7  - - 10.0  -  PH, Arterial 7.35 - 7.45 - - 7.344  7.39  7.332  7.316  7.359  7.436  7.428   PCO2 arterial 32 - 48 mmHg - - 41.4  35  37.3  42.6  38.5  31.2  33.1   Bicarbonate 20.0 - 28.0 mmol/L - - 25.6  22.5  21.2  19.9  21.9  21.9  21.0  23.1  21.9   TCO2 22 - 32 mmol/L - - 27  24  - 21  23  23  22  22  26  26  24  23  23  26    Acid-base deficit 0.0 - 2.0 mmol/L - -  1.0  3.0  3.1  6.0  4.0  3.0  3.0  2.0  2.0  O2 Saturation % - - 61  93  100  98  99  99  99  67  100     Details       Multiple values from one day are sorted in reverse-chronological order          Exercise Target Goals: Exercise Program Goal: Individual exercise prescription set using results from initial 6 min walk test and THRR while considering  patient's activity barriers and safety.   Exercise Prescription Goal: Initial exercise prescription builds to 30-45 minutes a day of aerobic activity, 2-3 days per week.  Home exercise guidelines will be given to patient during program as part of exercise prescription that the participant will acknowledge.   Education: Aerobic Exercise: - Group verbal and visual presentation on the components of exercise prescription. Introduces F.I.T.T principle from ACSM for exercise prescriptions.  Reviews F.I.T.T. principles of aerobic exercise including progression. Written material given at graduation.   Education: Resistance Exercise: - Group verbal and visual presentation on the components of exercise prescription. Introduces F.I.T.T principle from ACSM for exercise prescriptions  Reviews F.I.T.T. principles of resistance exercise including progression. Written material given at graduation.    Education: Exercise & Equipment Safety: - Individual verbal instruction and demonstration of equipment use and safety with use of the equipment. Flowsheet Row Cardiac Rehab from 02/10/2023 in [facility] Cardiac and Pulmonary Rehab  Date 02/10/23  Educator MB  Instruction Review Code 1- Verbalizes Understanding       Education: Exercise Physiology & General Exercise Guidelines: - Group verbal and written instruction with models to review the exercise physiology of the cardiovascular system and associated critical values. Provides general exercise guidelines with specific guidelines to those with heart or lung disease.  Flowsheet Row Cardiac Rehab from  12/11/2021 in [facility] Cardiac and Pulmonary Rehab  Date 11/13/21  Educator NT  Instruction Review Code 1- [employer] Understanding       Education: Flexibility, Balance, Mind/Body Relaxation: - Group verbal and visual presentation with interactive activity on the components of exercise prescription. Introduces F.I.T.T principle from ACSM for exercise prescriptions. Reviews F.I.T.T. principles of flexibility and balance exercise training including progression. Also discusses the mind body connection.  Reviews various relaxation techniques to help reduce and manage stress (i.e. Deep breathing, progressive muscle relaxation, and visualization). Balance handout provided to take home. Written material given at graduation.   Activity Barriers & Risk Stratification:  Activity Barriers & Cardiac Risk Stratification - 02/10/23 1004       Activity Barriers & Cardiac Risk Stratification   Activity Barriers Neck/Spine Problems;Shortness of Breath;Joint Problems;Back Problems    Cardiac Risk Stratification Moderate             6 Minute Walk:  6 Minute Walk     Row Name 02/10/23 1003         6 Minute Walk   Phase Initial     Distance 1050 feet     Walk Time 5.88 minutes     # of Rest Breaks 1     MPH 2.03     METS 2.73     RPE 14     Perceived Dyspnea  3     VO2 Peak 9.55     Symptoms Yes (comment)     Comments SOB     Resting HR 108 bpm     Resting BP 130/80     Resting Oxygen Saturation  100 %     Exercise Oxygen Saturation  during 6 min walk  100 %     Max Ex. HR 141 bpm     Max Ex. BP 142/90     2 Minute Post BP 132/90              Oxygen Initial Assessment:   Oxygen Re-Evaluation:   Oxygen Discharge (Final Oxygen Re-Evaluation):   Initial Exercise Prescription:  Initial Exercise Prescription - 02/10/23 1000       Date of Initial Exercise RX and Referring Provider   Date 02/10/23    Referring Provider Lorine Bears, MD      Oxygen   Maintain Oxygen  Saturation 88% or higher      Treadmill   MPH 2    Grade 0    Minutes 15    METs 2.53      Recumbant Bike   Level 2    RPM 50    Watts 31    Minutes 15    METs 2.73      REL-XR   Level 2    Speed 50    Minutes 15    METs 2.73      T5 Nustep   Level 2    SPM 80    Minutes 15    METs 2.73      Biostep-RELP   Level 2    SPM 50    Minutes 15    METs 2.73      Prescription Details   Frequency (times per week) 2    Duration Progress to 30 minutes of continuous aerobic without signs/symptoms of physical distress      Intensity   THRR 40-80% of Max Heartrate 130-152    Ratings of Perceived Exertion 11-13    Perceived Dyspnea 0-4      Progression   Progression Continue to progress workloads to maintain intensity without signs/symptoms of physical distress.      Resistance Training   Training Prescription Yes    Weight 5 lb    Reps 10-15             Perform Capillary Blood Glucose checks as needed.  Exercise Prescription Changes:   Exercise Prescription Changes     Row Name 02/10/23 1000             Response to Exercise   Blood Pressure (Admit) 130/80       Blood Pressure (Exercise) 142/90       Blood Pressure (Exit) 132/90       Heart Rate (Admit) 108 bpm       Heart Rate (Exercise) 141 bpm       Heart Rate (Exit) 114 bpm       Oxygen Saturation (Admit) 100 %       Oxygen Saturation (Exercise) 100 %       Oxygen Saturation (Exit) 100 %       Rating of Perceived Exertion (Exercise) 14       Perceived Dyspnea (Exercise) 3       Symptoms SOB       Comments results       Duration Progress to 30 minutes of  aerobic without signs/symptoms of physical distress       Intensity THRR New         Progression   Progression Continue to progress workloads to maintain intensity without signs/symptoms of physical distress.       Average METs 2.73  Exercise Comments:   Exercise Goals and Review:   Exercise Goals     Row  Name 02/10/23 1009             Exercise Goals   Increase Physical Activity Yes       Intervention Provide advice, education, support and counseling about physical activity/exercise needs.;Develop an individualized exercise prescription for aerobic and resistive training based on initial evaluation findings, risk stratification, comorbidities and participant's personal goals.       Expected Outcomes Long Term: Add in home exercise to make exercise part of routine and to increase amount of physical activity.;Short Term: Attend rehab on a regular basis to increase amount of physical activity.;Long Term: Exercising regularly at least 3-5 days a week.       Increase Strength and Stamina Yes       Intervention Provide advice, education, support and counseling about physical activity/exercise needs.;Develop an individualized exercise prescription for aerobic and resistive training based on initial evaluation findings, risk stratification, comorbidities and participant's personal goals.       Expected Outcomes Short Term: Increase workloads from initial exercise prescription for resistance, speed, and METs.;Short Term: Perform resistance training exercises routinely during rehab and add in resistance training at home;Long Term: Improve cardiorespiratory fitness, muscular endurance and strength as measured by increased METs and functional capacity ( )       Able to understand and use rate of perceived exertion (RPE) scale Yes       Intervention Provide education and explanation on how to use RPE scale       Expected Outcomes Short Term: Able to use RPE daily in rehab to express subjective intensity level;Long Term:  Able to use RPE to guide intensity level when exercising independently       Able to understand and use Dyspnea scale Yes       Intervention Provide education and explanation on how to use Dyspnea scale       Expected Outcomes Short Term: Able to use Dyspnea scale daily in rehab to express  subjective sense of shortness of breath during exertion;Long Term: Able to use Dyspnea scale to guide intensity level when exercising independently       Knowledge and understanding of Target Heart Rate Range (THRR) Yes       Intervention Provide education and explanation of THRR including how the numbers were predicted and where they are located for reference       Expected Outcomes Short Term: Able to state/look up THRR;Short Term: Able to use daily as guideline for intensity in rehab;Long Term: Able to use THRR to govern intensity when exercising independently       Able to check pulse independently Yes       Intervention Provide education and demonstration on how to check pulse in carotid and radial arteries.;Review the importance of being able to check your own pulse for safety during independent exercise       Expected Outcomes Short Term: Able to explain why pulse checking is important during independent exercise;Long Term: Able to check pulse independently and accurately       Understanding of Exercise Prescription Yes       Intervention Provide education, explanation, and written materials on patient's individual exercise prescription       Expected Outcomes Short Term: Able to explain program exercise prescription;Long Term: Able to explain home exercise prescription to exercise independently                Exercise  Goals Re-Evaluation :   Discharge Exercise Prescription (Final Exercise Prescription Changes):  Exercise Prescription Changes - 02/10/23 1000       Response to Exercise   Blood Pressure (Admit) 130/80    Blood Pressure (Exercise) 142/90    Blood Pressure (Exit) 132/90    Heart Rate (Admit) 108 bpm    Heart Rate (Exercise) 141 bpm    Heart Rate (Exit) 114 bpm    Oxygen Saturation (Admit) 100 %    Oxygen Saturation (Exercise) 100 %    Oxygen Saturation (Exit) 100 %    Rating of Perceived Exertion (Exercise) 14    Perceived Dyspnea (Exercise) 3    Symptoms SOB     Comments results    Duration Progress to 30 minutes of  aerobic without signs/symptoms of physical distress    Intensity THRR New      Progression   Progression Continue to progress workloads to maintain intensity without signs/symptoms of physical distress.    Average METs 2.73             Nutrition:  Target Goals: Understanding of nutrition guidelines, daily intake of sodium 1500mg , cholesterol 200mg , calories 30% from fat and 7% or less from saturated fats, daily to have 5 or more servings of fruits and vegetables.  Education: All About Nutrition: -Group instruction provided by verbal, written material, interactive activities, discussions, models, and posters to present general guidelines for heart healthy nutrition including fat, fiber, MyPlate, the role of sodium in heart healthy nutrition, utilization of the nutrition label, and utilization of this knowledge for meal planning. Follow up email sent as well. Written material given at graduation. Flowsheet Row Cardiac Rehab from 12/11/2021 in [facility] Cardiac and Pulmonary Rehab  Education need identified 10/21/21  Date 12/11/21  Educator MC  Instruction Review Code 1- Verbalizes Understanding       Biometrics:  Pre Biometrics - 02/10/23 1010       Pre Biometrics   Height 5' 9.7" (1.77 m)    Weight 300 lb 3.2 oz (136.2 kg)    Waist Circumference 54.5 inches    Hip Circumference 55 inches    Waist to Hip Ratio 0.99 %    BMI (Calculated) 43.46    Single Leg Stand 5 seconds              Nutrition Therapy Plan and Nutrition Goals:  Nutrition Therapy & Goals - 02/10/23 1011       Personal Nutrition Goals   Nutrition Goal Will meet with RD on 9/30 at 8:45am      Intervention Plan   Intervention Prescribe, educate and counsel regarding individualized specific dietary modifications aiming towards targeted core components such as weight, hypertension, lipid management, diabetes, heart failure and other  comorbidities.    Expected Outcomes Long Term Goal: Adherence to prescribed nutrition plan.;Short Term Goal: A plan has been developed with personal nutrition goals set during dietitian appointment.;Short Term Goal: Understand basic principles of dietary content, such as calories, fat, sodium, cholesterol and nutrients.             Nutrition Assessments:  MEDIFICTS Score Key: >=70 Need to make dietary changes  40-70 Heart Healthy Diet <= 40 Therapeutic Level Cholesterol Diet  Flowsheet Row Cardiac Rehab from 10/21/2021 in [facility] Cardiac and Pulmonary Rehab  Picture Your Plate Total Score on Admission 53      Picture Your Plate Scores: <16 Unhealthy dietary pattern with much room for improvement. 41-50 Dietary pattern unlikely to meet recommendations  for good health and room for improvement. 51-60 More healthful dietary pattern, with some room for improvement.  >60 Healthy dietary pattern, although there may be some specific behaviors that could be improved.    Nutrition Goals Re-Evaluation:   Nutrition Goals Discharge (Final Nutrition Goals Re-Evaluation):   Psychosocial: Target Goals: Acknowledge presence or absence of significant depression and/or stress, maximize coping skills, provide positive support system. Participant is able to verbalize types and ability to use techniques and skills needed for reducing stress and depression.   Education: Stress, Anxiety, and Depression - Group verbal and visual presentation to define topics covered.  Reviews how body is impacted by stress, anxiety, and depression.  Also discusses healthy ways to reduce stress and to treat/manage anxiety and depression.  Written material given at graduation. Flowsheet Row Cardiac Rehab from 12/11/2021 in [facility] Cardiac and Pulmonary Rehab  Date 11/06/21  Educator [facility]  Instruction Review Code 1- [employer] Understanding       Education: Sleep Hygiene -Provides group verbal and written instruction about  how sleep can affect your health.  Define sleep hygiene, discuss sleep cycles and impact of sleep habits. Review good sleep hygiene tips.    Initial Review & Psychosocial Screening:  Initial Psych Review & Screening - 02/02/23 1317       Initial Review   Current issues with None Identified      Family Dynamics   Good Support System? Yes   wife, family, friends, church family     Barriers   Psychosocial barriers to participate in program There are no identifiable barriers or psychosocial needs.      Screening Interventions   Interventions Encouraged to exercise;To provide support and resources with identified psychosocial needs;Provide feedback about the scores to participant    Expected Outcomes Short Term goal: Utilizing psychosocial counselor, staff and physician to assist with identification of specific Stressors or current issues interfering with healing process. Setting desired goal for each stressor or current issue identified.;Long Term Goal: Stressors or current issues are controlled or eliminated.;Short Term goal: Identification and review with participant of any Quality of Life or Depression concerns found by scoring the questionnaire.;Long Term goal: The participant improves quality of Life and PHQ9 Scores as seen by post scores and/or verbalization of changes             Quality of Life Scores:   Scores of 19 and below usually indicate a poorer quality of life in these areas.  A difference of  2-3 points is a clinically meaningful difference.  A difference of 2-3 points in the total score of the Quality of Life Index has been associated with significant improvement in overall quality of life, self-image, physical symptoms, and general health in studies assessing change in quality of life.  PHQ-9: Review Flowsheet  More data exists      02/10/2023 12/29/2021 12/08/2021 11/13/2021 10/21/2021  Depression screen PHQ 2/9  Decreased Interest 1 0 0 1 2  Down, Depressed, Hopeless 0  0 1 0 0  PHQ - 2 Score 1 0 1 1 2   Altered sleeping 2 1 2 2 2   Tired, decreased energy 3 2 2 2 3   Change in appetite 0 0 0 1 2  Feeling bad or failure about yourself  0 0 0 0 0  Trouble concentrating 0 0 1 0 0  Moving slowly or fidgety/restless 1 0 1 0 1  Suicidal thoughts 0 0 0 0 0  PHQ-9 Score 7 3 7 6  10  Difficult doing work/chores Somewhat difficult Somewhat difficult Somewhat difficult Somewhat difficult Somewhat difficult    Details           Interpretation of Total Score  Total Score Depression Severity:  1-4 = Minimal depression, 5-9 = Mild depression, 10-14 = Moderate depression, 15-19 = Moderately severe depression, 20-27 = Severe depression   Psychosocial Evaluation and Intervention:  Psychosocial Evaluation - 02/02/23 1323       Psychosocial Evaluation & Interventions   Interventions Encouraged to exercise with the program and follow exercise prescription    Comments Yarden 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 He reports that prior to returning to work this week, he was stressed about being on short term disability and the process of getting back into work. However, his boss and coworkers have been very supportive and working with him every step to make sure his transition back to full time is smooth. He is very grateful for his wife, family, and friends as they have been a big support. He has been working on losing weight and is proud of his progress. He wants to continue working on his weight and risk factor management.    Expected Outcomes Short: attend cardiac rehab for education and exercise. Long: develop and maintain positive self care habits.    Continue Psychosocial Services  Follow up required by staff             Psychosocial Re-Evaluation:   Psychosocial Discharge (Final Psychosocial Re-Evaluation):   Vocational Rehabilitation: Provide vocational rehab assistance to qualifying candidates.   Vocational Rehab Evaluation &  Intervention:  Vocational Rehab - 02/02/23 1315       Initial Vocational Rehab Evaluation & Intervention   Assessment shows need for Vocational Rehabilitation No             Education: Education Goals: Education classes will be provided on a variety of topics geared toward better understanding of heart health and risk factor modification. Participant will state understanding/return demonstration of topics presented as noted by education test scores.  Learning Barriers/Preferences:  Learning Barriers/Preferences - 02/02/23 1323       Learning Barriers/Preferences   Learning Barriers None    Learning Preferences None             General Cardiac Education Topics:  AED/CPR: - Group verbal and written instruction with the use of models to demonstrate the basic use of the AED with the basic ABC's of resuscitation.   Anatomy and Cardiac Procedures: - Group verbal and visual presentation and models provide information about basic cardiac anatomy and function. Reviews the testing methods done to diagnose heart disease and the outcomes of the test results. Describes the treatment choices: Medical Management, Angioplasty, or Coronary Bypass Surgery for treating various heart conditions including Myocardial Infarction, Angina, Valve Disease, and Cardiac Arrhythmias.  Written material given at graduation. Flowsheet Row Cardiac Rehab from 12/11/2021 in [facility] Cardiac and Pulmonary Rehab  Education need identified 10/21/21  Date 11/27/21  Educator SB  Instruction Review Code 1- Verbalizes Understanding       Medication Safety: - Group verbal and visual instruction to review commonly prescribed medications for heart and lung disease. Reviews the medication, class of the drug, and side effects. Includes the steps to properly store meds and maintain the prescription regimen.  Written material given at graduation.   Intimacy: - Group verbal instruction through game format to discuss how  heart and lung disease can affect sexual intimacy. Written material given at  graduation..   Know Your Numbers and Heart Failure: - Group verbal and visual instruction to discuss disease risk factors for cardiac and pulmonary disease and treatment options.  Reviews associated critical values for Overweight/Obesity, Hypertension, Cholesterol, and Diabetes.  Discusses basics of heart failure: signs/symptoms and treatments.  Introduces Heart Failure Zone chart for action plan for heart failure.  Written material given at graduation.   Infection Prevention: - Provides verbal and written material to individual with discussion of infection control including proper hand washing and proper equipment cleaning during exercise session. Flowsheet Row Cardiac Rehab from 02/10/2023 in [facility] Cardiac and Pulmonary Rehab  Date 02/10/23  Educator MB  Instruction Review Code 1- Verbalizes Understanding       Falls Prevention: - Provides verbal and written material to individual with discussion of falls prevention and safety. Flowsheet Row Cardiac Rehab from 02/10/2023 in [facility] Cardiac and Pulmonary Rehab  Date 02/10/23  Educator MB  Instruction Review Code 1- Verbalizes Understanding       Other: -Provides group and verbal instruction on various topics (see comments)   Knowledge Questionnaire Score:   Core Components/Risk Factors/Patient Goals at Admission:  Personal Goals and Risk Factors at Admission - 02/10/23 1012       Core Components/Risk Factors/Patient Goals on Admission    Weight Management Yes;Obesity;Weight Loss    Intervention Weight Management: Develop a combined nutrition and exercise program designed to reach desired caloric intake, while maintaining appropriate intake of nutrient and fiber, sodium and fats, and appropriate energy expenditure required for the weight goal.;Weight Management/Obesity: Establish reasonable short term and long term weight goals.;Obesity: Provide education  and appropriate resources to help participant work on and attain dietary goals.;Weight Management: Provide education and appropriate resources to help participant work on and attain dietary goals.    Admit Weight 300 lb 3.2 oz (136.2 kg)    Goal Weight: Short Term 260 lb (117.9 kg)    Goal Weight: Long Term 210 lb (95.3 kg)    Expected Outcomes Short Term: Continue to assess and modify interventions until short term weight is achieved;Long Term: Adherence to nutrition and physical activity/exercise program aimed toward attainment of established weight goal;Weight Loss: Understanding of general recommendations for a balanced deficit meal plan, which promotes 1-2 lb weight loss per week and includes a negative energy balance of [phone] kcal/d;Understanding recommendations for meals to include 15-35% energy as protein, 25-35% energy from fat, 35-60% energy from carbohydrates, less than 200mg  of dietary cholesterol, 20-35 gm of total fiber daily;Understanding of distribution of calorie intake throughout the day with the consumption of 4-5 meals/snacks    Diabetes Yes    Intervention Provide education about signs/symptoms and action to take for hypo/hyperglycemia.;Provide education about proper nutrition, including hydration, and aerobic/resistive exercise prescription along with prescribed medications to achieve blood glucose in normal ranges: Fasting glucose 65-99 mg/dL    Expected Outcomes Short Term: Participant verbalizes understanding of the signs/symptoms and immediate care of hyper/hypoglycemia, proper foot care and importance of medication, aerobic/resistive exercise and nutrition plan for blood glucose control.;Long Term: Attainment of HbA1C < 7%.    Heart Failure Yes    Intervention Provide a combined exercise and nutrition program that is supplemented with education, support and counseling about heart failure. Directed toward relieving symptoms such as shortness of breath, decreased exercise  tolerance, and extremity edema.    Expected Outcomes Short term: Attendance in program 2-3 days a week with increased exercise capacity. Reported lower sodium intake. Reported increased fruit and vegetable intake.  Reports medication compliance.;Improve functional capacity of life;Short term: Daily weights obtained and reported for increase. Utilizing diuretic protocols set by physician.;Long term: Adoption of self-care skills and reduction of barriers for early signs and symptoms recognition and intervention leading to self-care maintenance.    Hypertension Yes    Intervention Provide education on lifestyle modifcations including regular physical activity/exercise, weight management, moderate sodium restriction and increased consumption of fresh fruit, vegetables, and low fat dairy, alcohol moderation, and smoking cessation.;Monitor prescription use compliance.    Expected Outcomes Short Term: Continued assessment and intervention until BP is < 140/33mm HG in hypertensive participants. < 130/75mm HG in hypertensive participants with diabetes, heart failure or chronic kidney disease.;Long Term: Maintenance of blood pressure at goal levels.    Lipids Yes    Intervention Provide education and support for participant on nutrition & aerobic/resistive exercise along with prescribed medications to achieve LDL 70mg , HDL >40mg .    Expected Outcomes Short Term: Participant states understanding of desired cholesterol values and is compliant with medications prescribed. Participant is following exercise prescription and nutrition guidelines.;Long Term: Cholesterol controlled with medications as prescribed, with individualized exercise RX and with personalized nutrition plan. Value goals: LDL < 70mg , HDL > 40 mg.             Education:Diabetes - Individual verbal and written instruction to review signs/symptoms of diabetes, desired ranges of glucose level fasting, after meals and with exercise. Acknowledge that  pre and post exercise glucose checks will be done for 3 sessions at entry of program. Flowsheet Row Cardiac Rehab from 02/10/2023 in [facility] Cardiac and Pulmonary Rehab  Date 02/10/23  Educator MB  Instruction Review Code 1- Verbalizes Understanding       Core Components/Risk Factors/Patient Goals Review:    Core Components/Risk Factors/Patient Goals at Discharge (Final Review):    ITP Comments:  ITP Comments     Row Name 02/10/23 1002           ITP Comments Completed and gym orientation. Initial ITP created and sent for review to Dr. Bethann Punches, Medical Director.                Comments: Initial ITP

## 2023-02-15 ENCOUNTER — Encounter: Payer: BLUE CROSS/BLUE SHIELD | Admitting: *Deleted

## 2023-02-15 DIAGNOSIS — Z48812 Encounter for surgical aftercare following surgery on the circulatory system: Secondary | ICD-10-CM | POA: Diagnosis not present

## 2023-02-15 DIAGNOSIS — Z952 Presence of prosthetic heart valve: Secondary | ICD-10-CM | POA: Diagnosis not present

## 2023-02-15 DIAGNOSIS — Z955 Presence of coronary angioplasty implant and graft: Secondary | ICD-10-CM

## 2023-02-15 LAB — GLUCOSE, CAPILLARY
Glucose-Capillary: 207 mg/dL — ABNORMAL HIGH (ref 70–99)
Glucose-Capillary: 194 mg/dL — ABNORMAL HIGH (ref 70–99)

## 2023-02-15 NOTE — Progress Notes (Signed)
Assessment start time: 8:48 AM  Digestive issues/concerns: no known food allergies   24-hours Recall: B: Bacon egg and cheese biscuit, diet Dr pepper  L: four cheese hot pocket, a handful of salt and vinegar chips, diet orange soda  D: ham and cheese soda, peanuts, diet soda  Beverages ~32oz of water  Education r/t nutrition plan Patient drinking ~32oz of water, drinks diet soda as well. He reports occasionally drinking juice. His A1C is 10.0% as of 10/28/22. Spoke with him about this, he is somewhat knowledgeable about carbs. He knew about simple sugars vs complex carbs, carb sources, and the goal of ~45g per meal. Asked if he counted carbs, he reported he didn't, says he would choose meals that were easiest and that both he and his wife likes. Encouraged him to be mindful of carb portions provided some examples of appropriate portions of carbs at his meals. Built out several meals and snacks with foods he likes and will eat, focusing on consistent carb portions (~30-60g each meal) with high quality carbs paired with lean protein. He likes veggies, encouraged him to eat them in large portions to help control carb portions on his plates. Provided and reviewed several Mediterranean diet handouts to promote more heart healthy eating. Educated on types of fats, sodium, and whole grains. He is optimistic he can make some of these changes. Will continue to monitor and encouraged him to meet with RD again in a few weeks       Goal 1: Write down on paper and low 1-2 days on app per week Goal 2: Watch carb sources, aim for ~30-60g per meal  Goal 3: Drink 3 bottles per day, cut back on diet soda  End time 9:20 AM

## 2023-02-15 NOTE — Progress Notes (Signed)
Daily Session Note  Patient Details  Name: Brett Marshall MRN: [id number] Date of Birth: 04/15/1966 Referring Provider:   Flowsheet Row Cardiac Rehab from 02/10/2023 in [facility] Cardiac [facility] Pul[facility] Rehab  Referring Provider Lorine Bears, MD       Encounter Date: 02/15/2023  Check In:  Session Check In - 02/15/23 0808       Check-In   Supervising physician immediately available to respond to emergencies See telemetry face sheet for immediately available ER MD    Location [facility]-Cardiac & Pulmonary Rehab    Staff Present Elige Ko, Algernon Huxley, BS, ACSM CEP, Exercise Physiologist;Dotti Busey Katrinka Blazing, RN, ADN    Virtual Visit No    Medication changes reported     No    Fall or balance concerns reported    No    Warm-up and Cool-down Performed on first and last piece of equipment    Resistance Training Performed Yes    VAD Patient? No    PAD/SET Patient? No      Pain Assessment   Currently in Pain? No/denies                Social History   Tobacco Use  Smoking Status Never  Smokeless Tobacco Never    Goals Met:  Independence with exercise equipment Exercise tolerated well No report of concerns or symptoms today Strength training completed today  Goals Unmet:  Not Applicable  Comments: First full day of exercise!  Patient was oriented to gym and equipment including functions, settings, policies, and procedures.  Patient's individual exercise prescription and treatment plan were reviewed.  All starting workloads were established based on the results of the 6 minute walk test done at initial orientation visit.  The plan for exercise progression was also introduced and progression will be customized based on patient's performance and goals.    Dr. Bethann Punches is Medical Director for [facility] Cardiac Rehabilitation.  Dr. Vida Rigger is Medical Director for [facility] Pulmonary Rehabilitation.

## 2023-02-17 ENCOUNTER — Encounter: Payer: Self-pay | Admitting: *Deleted

## 2023-02-17 DIAGNOSIS — Z952 Presence of prosthetic heart valve: Secondary | ICD-10-CM

## 2023-02-17 NOTE — Progress Notes (Signed)
Cardiac Individual Treatment Plan  Patient Details  Name: Brett Marshall MRN: [id number] Date of Birth: 08-06-1965 Referring Provider:   Flowsheet Row Cardiac Rehab from 02/10/2023 in [facility] Cardiac and Pulmonary Rehab  Referring Provider Lorine Bears, MD       Initial Encounter Date:  Flowsheet Row Cardiac Rehab from 02/10/2023 in [facility] Cardiac and Pulmonary Rehab  Date 02/10/23       Visit Diagnosis: S/P AVR (aortic valve replacement)  Patient's Home Medications on Admission:  Current Outpatient Medications:    acetaminophen (TYLENOL) 325 MG tablet, Take 650 mg by mouth every 6 (six) hours as needed for moderate pain., Disp: , Rfl:    albuterol (VENTOLIN HFA) 108 (90 Base) MCG/ACT inhaler, Inhale 1-2 puffs into the lungs every 6 (six) hours as needed for wheezing or shortness of breath., Disp: 18 g, Rfl: 1   aspirin EC 325 MG tablet, Take 1 tablet (325 mg total) by mouth daily., Disp: , Rfl:    atorvastatin (LIPITOR) 40 MG tablet, Take 1 tablet (40 mg total) by mouth daily at 6 PM., Disp: 30 tablet, Rfl: 2   cholecalciferol (CHOLECALCIFEROL) 25 MCG tablet, Take 1 tablet (1,000 Units total) by mouth daily., Disp: 30 tablet, Rfl: 0   cyanocobalamin (VITAMIN B12) 1000 MCG tablet, Take 1 tablet (1,000 mcg total) by mouth daily., Disp: , Rfl:    cyclobenzaprine (FLEXERIL) 10 MG tablet, Take 1 tablet (10 mg total) by mouth 3 (three) times daily as needed.For spasms (Patient taking differently: Take 30 mg by mouth daily as needed for muscle spasms. For spasms), Disp: 90 tablet, Rfl: 5   DULoxetine (CYMBALTA) 60 MG capsule, Take 120 mg by mouth at bedtime., Disp: , Rfl:    famotidine (PEPCID) 40 MG tablet, Take 40 mg by mouth at bedtime., Disp: , Rfl:    ferrous sulfate 325 (65 FE) MG EC tablet, Take 1 tablet (325 mg total) by mouth daily with breakfast., Disp: 30 tablet, Rfl: 0   fluticasone (FLONASE) 50 MCG/ACT nasal spray, Place 1 spray into both nostrils daily as needed for allergies or  rhinitis., Disp: , Rfl:    fluticasone-salmeterol (ADVAIR) 250-50 MCG/ACT AEPB, Inhale 1 puff into the lungs every 12 (twelve) hours., Disp: 60 each, Rfl: 6   furosemide (LASIX) 20 MG tablet, Take 1 tablet (20 mg total) by mouth daily as needed., Disp: 90 tablet, Rfl: 0   gabapentin (NEURONTIN) 300 MG capsule, Take 300 mg by mouth at bedtime., Disp: , Rfl:    glucose blood (GE100 BLOOD GLUCOSE TEST) test strip, Use as instructed, Disp: 100 each, Rfl: 12   ibuprofen (MOTRIN IB) 200 MG tablet, Take 2 tablets (400 mg total) by mouth 3 (three) times daily., Disp: 100 tablet, Rfl: 2   insulin glargine (LANTUS) 100 unit/mL SOPN, Inject 30 Units into the skin daily., Disp: 15 mL, Rfl: 11   JARDIANCE 25 MG TABS tablet, Take 25 mg by mouth daily., Disp: , Rfl:    Lancets (ONETOUCH ULTRASOFT) lancets, Check sugar twice daily, Disp: 100 each, Rfl: 12   lidocaine (LIDODERM) 5 %, Place 1 patch onto the skin daily. Remove & Discard patch within 12 hours or as directed by MD. May place on right or left side of chest (Patient taking differently: Place 1 patch onto the skin daily as needed (pain). Remove & Discard patch within 12 hours or as directed by MD. May place on right or left side of chest), Disp: 14 patch, Rfl: 0   metFORMIN (GLUCOPHAGE) 1000  MG tablet, Take 1 tablet (1,000 mg total) by mouth 2 (two) times daily with a meal., Disp: 180 tablet, Rfl: 1   metoprolol tartrate (LOPRESSOR) 25 MG tablet, Take 0.5 tablets (12.5 mg total) by mouth 2 (two) times daily., Disp: 30 tablet, Rfl: 2   traZODone (DESYREL) 50 MG tablet, Take 50 mg by mouth at bedtime., Disp: , Rfl:   Past Medical History: Past Medical History:  Diagnosis Date   Asthma    CAD (coronary artery disease)    a. 08/2017 Cath: Diff nonobs dzs, EF 45-50%, diff HK; b. 08/2021 MV: Ant/apical ishemia; c. 08/2021 PCI: LAD 95ost (4.0x15 Onyx Frontier DES), EF 55-65%; c. 09/2021 Cath: LM nl, LAD patent stent, 1m, 30d, RI min irregs, LCX min irregs, RCA  50p. LVOT grad 50-98mmHg (rest), 70-179mmHg (provoked); d. 10/2022 Cath: LM nl, LAD 56m, 60d, RI min irregs, LCX min irregs, RCA 50p.   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 08/2017 LV gram: EF 45-50%, glob HK; b. 08/2021 Echo: EF 60-65%, no rwma, GrI DD, nl RV fxn; c. 09/2021 cMRI: EF 60%, no LVOT obstruction/gradient. Mild conc LVH, mild AS. No myocardial scar/fibrosis. No evidence of HCM.   Hyperlipidemia    Leg swelling    Left   Low back pain    Mixed ICM & NICM    a. 08/2017 LV gram: EF 45-50%, glob HK; b. 08/2021 Echo: EF 60-65%.   Morbid obesity (HCC)    OSA on CPAP    non compliant   Pulmonary hypertension (HCC)    Severe aortic stenosis    a. 10/2022 Cath: 31.31mmHg gradient and AVA 1.05cm^2; b. 10/2022 s/p 23mm Edwards Resilia bioprosthetic AoV, resection of sub-aortic membrane, and septal myectomy; c. 10/2022 Echo: EF 65-70%, no rwma, nl AoV structure/fxn.    Tobacco Use: Social History   Tobacco Use  Smoking Status Never  Smokeless Tobacco Never    Labs: Review Flowsheet  More data exists      Latest Ref Rng & Units 09/07/2022 09/08/2022 10/19/2022 10/28/2022 10/30/2022  Labs for ITP Cardiac and Pulmonary Rehab  Cholestrol 0 - 200 mg/dL - 536  - - -  LDL (calc) 0 - 99 mg/dL - 50  - - -  HDL-C >64 mg/dL - 31  - - -  Trlycerides <150 mg/dL - 403  - - -  Hemoglobin A1c 4.8 - 5.6 % 11.7  - - 10.0  -  PH, Arterial 7.35 - 7.45 - - 7.344  7.39  7.332  7.316  7.359  7.436  7.428   PCO2 arterial 32 - 48 mmHg - - 41.4  35  37.3  42.6  38.5  31.2  33.1   Bicarbonate 20.0 - 28.0 mmol/L - - 25.6  22.5  21.2  19.9  21.9  21.9  21.0  23.1  21.9   TCO2 22 - 32 mmol/L - - 27  24  - 21  23  23  22  22  26  26  24  23  23  26    Acid-base deficit 0.0 - 2.0 mmol/L - -  1.0  3.0  3.1  6.0  4.0  3.0  3.0  2.0  2.0  O2 Saturation % - - 61  93  100  98  99  99  99  67  100     Details       Multiple values from one day are sorted in reverse-chronological order          Exercise Target Goals: Exercise Program Goal: Individual exercise prescription set using results from initial 6 min walk test and THRR while considering  patient's activity barriers and safety.   Exercise Prescription Goal: Initial exercise prescription builds to 30-45 minutes a day of aerobic activity, 2-3 days per week.  Home exercise guidelines will be given to patient during program as part of exercise prescription that the participant will acknowledge.   Education: Aerobic Exercise: - Group verbal and visual presentation on the components of exercise prescription. Introduces F.I.T.T principle from ACSM for exercise prescriptions.  Reviews F.I.T.T. principles of aerobic exercise including progression. Written material given at graduation.   Education: Resistance Exercise: - Group verbal and visual presentation on the components of exercise prescription. Introduces F.I.T.T principle from ACSM for exercise prescriptions  Reviews F.I.T.T. principles of resistance exercise including progression. Written material given at graduation.    Education: Exercise & Equipment Safety: - Individual verbal instruction and demonstration of equipment use and safety with use of the equipment. Flowsheet Row Cardiac Rehab from 02/10/2023 in [facility] Cardiac and Pulmonary Rehab  Date 02/10/23  Educator MB  Instruction Review Code 1- Verbalizes Understanding       Education: Exercise Physiology & General Exercise Guidelines: - Group verbal and written instruction with models to review the exercise physiology of the cardiovascular system and associated critical values. Provides general exercise guidelines with specific guidelines to those with heart or lung disease.  Flowsheet Row Cardiac Rehab from  12/11/2021 in [facility] Cardiac and Pulmonary Rehab  Date 11/13/21  Educator NT  Instruction Review Code 1- [employer] Understanding       Education: Flexibility, Balance, Mind/Body Relaxation: - Group verbal and visual presentation with interactive activity on the components of exercise prescription. Introduces F.I.T.T principle from ACSM for exercise prescriptions. Reviews F.I.T.T. principles of flexibility and balance exercise training including progression. Also discusses the mind body connection.  Reviews various relaxation techniques to help reduce and manage stress (i.e. Deep breathing, progressive muscle relaxation, and visualization). Balance handout provided to take home. Written material given at graduation.   Activity Barriers & Risk Stratification:  Activity Barriers & Cardiac Risk Stratification - 02/10/23 1004       Activity Barriers & Cardiac Risk Stratification   Activity Barriers Neck/Spine Problems;Shortness of Breath;Joint Problems;Back Problems    Cardiac Risk Stratification Moderate             6 Minute Walk:  6 Minute Walk     Row Name 02/10/23 1003         6 Minute Walk   Phase Initial     Distance 1050 feet     Walk Time 5.88 minutes     # of Rest Breaks 1     MPH 2.03     METS 2.73     RPE 14     Perceived Dyspnea  3     VO2 Peak 9.55     Symptoms Yes (comment)     Comments SOB     Resting HR 108 bpm     Resting BP 130/80     Resting Oxygen Saturation  100 %     Exercise Oxygen Saturation  during 6 min walk  100 %     Max Ex. HR 141 bpm     Max Ex. BP 142/90     2 Minute Post BP 132/90              Oxygen Initial Assessment:   Oxygen Re-Evaluation:   Oxygen Discharge (Final Oxygen Re-Evaluation):   Initial Exercise Prescription:  Initial Exercise Prescription - 02/10/23 1000       Date of Initial Exercise RX and Referring Provider   Date 02/10/23    Referring Provider Lorine Bears, MD      Oxygen   Maintain Oxygen  Saturation 88% or higher      Treadmill   MPH 2    Grade 0    Minutes 15    METs 2.53      Recumbant Bike   Level 2    RPM 50    Watts 31    Minutes 15    METs 2.73      REL-XR   Level 2    Speed 50    Minutes 15    METs 2.73      T5 Nustep   Level 2    SPM 80    Minutes 15    METs 2.73      Biostep-RELP   Level 2    SPM 50    Minutes 15    METs 2.73      Prescription Details   Frequency (times per week) 2    Duration Progress to 30 minutes of continuous aerobic without signs/symptoms of physical distress      Intensity   THRR 40-80% of Max Heartrate 130-152    Ratings of Perceived Exertion 11-13    Perceived Dyspnea 0-4      Progression   Progression Continue to progress workloads to maintain intensity without signs/symptoms of physical distress.      Resistance Training   Training Prescription Yes    Weight 5 lb    Reps 10-15             Perform Capillary Blood Glucose checks as needed.  Exercise Prescription Changes:   Exercise Prescription Changes     Row Name 02/10/23 1000             Response to Exercise   Blood Pressure (Admit) 130/80       Blood Pressure (Exercise) 142/90       Blood Pressure (Exit) 132/90       Heart Rate (Admit) 108 bpm       Heart Rate (Exercise) 141 bpm       Heart Rate (Exit) 114 bpm       Oxygen Saturation (Admit) 100 %       Oxygen Saturation (Exercise) 100 %       Oxygen Saturation (Exit) 100 %       Rating of Perceived Exertion (Exercise) 14       Perceived Dyspnea (Exercise) 3       Symptoms SOB       Comments results       Duration Progress to 30 minutes of  aerobic without signs/symptoms of physical distress       Intensity THRR New         Progression   Progression Continue to progress workloads to maintain intensity without signs/symptoms of physical distress.       Average METs 2.73  Exercise Comments:   Exercise Comments     Row Name 02/15/23 0809            Exercise Comments First full day of exercise!  Patient was oriented to gym and equipment including functions, settings, policies, and procedures.  Patient's individual exercise prescription and treatment plan were reviewed.  All starting workloads were established based on the results of the 6 minute walk test done at initial orientation visit.  The plan for exercise progression was also introduced and progression will be customized based on patient's performance and goals.                Exercise Goals and Review:   Exercise Goals     Row Name 02/10/23 1009             Exercise Goals   Increase Physical Activity Yes       Intervention Provide advice, education, support and counseling about physical activity/exercise needs.;Develop an individualized exercise prescription for aerobic and resistive training based on initial evaluation findings, risk stratification, comorbidities and participant's personal goals.       Expected Outcomes Long Term: Add in home exercise to make exercise part of routine and to increase amount of physical activity.;Short Term: Attend rehab on a regular basis to increase amount of physical activity.;Long Term: Exercising regularly at least 3-5 days a week.       Increase Strength and Stamina Yes       Intervention Provide advice, education, support and counseling about physical activity/exercise needs.;Develop an individualized exercise prescription for aerobic and resistive training based on initial evaluation findings, risk stratification, comorbidities and participant's personal goals.       Expected Outcomes Short Term: Increase workloads from initial exercise prescription for resistance, speed, and METs.;Short Term: Perform resistance training exercises routinely during rehab and add in resistance training at home;Long Term: Improve cardiorespiratory fitness, muscular endurance and strength as measured by increased METs and functional capacity ( )        Able to understand and use rate of perceived exertion (RPE) scale Yes       Intervention Provide education and explanation on how to use RPE scale       Expected Outcomes Short Term: Able to use RPE daily in rehab to express subjective intensity level;Long Term:  Able to use RPE to guide intensity level when exercising independently       Able to understand and use Dyspnea scale Yes       Intervention Provide education and explanation on how to use Dyspnea scale       Expected Outcomes Short Term: Able to use Dyspnea scale daily in rehab to express subjective sense of shortness of breath during exertion;Long Term: Able to use Dyspnea scale to guide intensity level when exercising independently       Knowledge and understanding of Target Heart Rate Range (THRR) Yes       Intervention Provide education and explanation of THRR including how the numbers were predicted and where they are located for reference       Expected Outcomes Short Term: Able to state/look up THRR;Short Term: Able to use daily as guideline for intensity in rehab;Long Term: Able to use THRR to govern intensity when exercising independently       Able to check pulse independently Yes       Intervention Provide education and demonstration on how to check pulse in carotid and radial arteries.;Review the importance of being able to check your  own pulse for safety during independent exercise       Expected Outcomes Short Term: Able to explain why pulse checking is important during independent exercise;Long Term: Able to check pulse independently and accurately       Understanding of Exercise Prescription Yes       Intervention Provide education, explanation, and written materials on patient's individual exercise prescription       Expected Outcomes Short Term: Able to explain program exercise prescription;Long Term: Able to explain home exercise prescription to exercise independently                Exercise Goals Re-Evaluation :   Exercise Goals Re-Evaluation     Row Name 02/15/23 0809             Exercise Goal Re-Evaluation   Exercise Goals Review Able to understand and use rate of perceived exertion (RPE) scale;Able to understand and use Dyspnea scale;Knowledge and understanding of Target Heart Rate Range (THRR);Understanding of Exercise Prescription       Comments Reviewed RPE and dyspnea scale, THR and program prescription with pt today.  Pt voiced understanding and was given a copy of goals to take home.       Expected Outcomes Short: Use RPE daily to regulate intensity. Long: Follow program prescription in THR.                Discharge Exercise Prescription (Final Exercise Prescription Changes):  Exercise Prescription Changes - 02/10/23 1000       Response to Exercise   Blood Pressure (Admit) 130/80    Blood Pressure (Exercise) 142/90    Blood Pressure (Exit) 132/90    Heart Rate (Admit) 108 bpm    Heart Rate (Exercise) 141 bpm    Heart Rate (Exit) 114 bpm    Oxygen Saturation (Admit) 100 %    Oxygen Saturation (Exercise) 100 %    Oxygen Saturation (Exit) 100 %    Rating of Perceived Exertion (Exercise) 14    Perceived Dyspnea (Exercise) 3    Symptoms SOB    Comments results    Duration Progress to 30 minutes of  aerobic without signs/symptoms of physical distress    Intensity THRR New      Progression   Progression Continue to progress workloads to maintain intensity without signs/symptoms of physical distress.    Average METs 2.73             Nutrition:  Target Goals: Understanding of nutrition guidelines, daily intake of sodium 1500mg , cholesterol 200mg , calories 30% from fat and 7% or less from saturated fats, daily to have 5 or more servings of fruits and vegetables.  Education: All About Nutrition: -Group instruction provided by verbal, written material, interactive activities, discussions, models, and posters to present general guidelines for heart healthy nutrition  including fat, fiber, MyPlate, the role of sodium in heart healthy nutrition, utilization of the nutrition label, and utilization of this knowledge for meal planning. Follow up email sent as well. Written material given at graduation. Flowsheet Row Cardiac Rehab from 12/11/2021 in [facility] Cardiac and Pulmonary Rehab  Education need identified 10/21/21  Date 12/11/21  Educator [facility]  Instruction Review Code 1- Verbalizes Understanding       Biometrics:  Pre Biometrics - 02/10/23 1010       Pre Biometrics   Height 5' 9.7" (1.77 m)    Weight 300 lb 3.2 oz (136.2 kg)    Waist Circumference 54.5 inches    Hip Circumference 55  inches    Waist to Hip Ratio 0.99 %    BMI (Calculated) 43.46    Single Leg Stand 5 seconds              Nutrition Therapy Plan and Nutrition Goals:  Nutrition Therapy & Goals - 02/15/23 0946       Nutrition Therapy   Diet Carb controlled, Cardiac, Low Na    Protein (specify units) 90    Fiber 30 grams    Whole Grain Foods 3 servings    Saturated Fats 15 max. grams    Fruits and Vegetables 5 servings/day    Sodium 2 grams      Personal Nutrition Goals   Nutrition Goal Write down on paper and low 1-2 days on app per week    Personal Goal #2 Watch carb sources, aim for ~30-60g per meal    Personal Goal #3 Drink 3 bottles per day, cut back on diet soda    Comments Patient drinking ~32oz of water, drinks diet soda as well. He reports occasionally drinking juice. His A1C is 10.0% as of 10/28/22. Spoke with him about this, he is somewhat knowledgeable about carbs. He knew about simple sugars vs complex carbs, carb sources, and the goal of ~45g per meal. Asked if he counted carbs, he reported he didn't, says he would choose meals that were easiest and that both he and his wife likes. Encouraged him to be mindful of carb portions provided some examples of appropriate portions of carbs at his meals. Built out several meals and snacks with foods he likes and will eat,  focusing on consistent carb portions (~30-60g each meal) with high quality carbs paired with lean protein. He likes veggies, encouraged him to eat them in large portions to help control carb portions on his plates. Provided and reviewed several Mediterranean diet handouts to promote more heart healthy eating. Educated on types of fats, sodium, and whole grains. He is optimistic he can make some of these changes. Will continue to monitor and encouraged him to meet with RD again in a few weeks      Intervention Plan   Intervention Prescribe, educate and counsel regarding individualized specific dietary modifications aiming towards targeted core components such as weight, hypertension, lipid management, diabetes, heart failure and other comorbidities.;Nutrition handout(s) given to patient.    Expected Outcomes Short Term Goal: Understand basic principles of dietary content, such as calories, fat, sodium, cholesterol and nutrients.;Short Term Goal: A plan has been developed with personal nutrition goals set during dietitian appointment.;Long Term Goal: Adherence to prescribed nutrition plan.             Nutrition Assessments:  MEDIFICTS Score Key: >=70 Need to make dietary changes  40-70 Heart Healthy Diet <= 40 Therapeutic Level Cholesterol Diet  Flowsheet Row Cardiac Rehab from 10/21/2021 in [facility] Cardiac and Pulmonary Rehab  Picture Your Plate Total Score on Admission 53      Picture Your Plate Scores: <21 Unhealthy dietary pattern with much room for improvement. 41-50 Dietary pattern unlikely to meet recommendations for good health and room for improvement. 51-60 More healthful dietary pattern, with some room for improvement.  >60 Healthy dietary pattern, although there may be some specific behaviors that could be improved.    Nutrition Goals Re-Evaluation:   Nutrition Goals Discharge (Final Nutrition Goals Re-Evaluation):   Psychosocial: Target Goals: Acknowledge presence or  absence of significant depression and/or stress, maximize coping skills, provide positive support system. Participant is able to verbalize  types and ability to use techniques and skills needed for reducing stress and depression.   Education: Stress, Anxiety, and Depression - Group verbal and visual presentation to define topics covered.  Reviews how body is impacted by stress, anxiety, and depression.  Also discusses healthy ways to reduce stress and to treat/manage anxiety and depression.  Written material given at graduation. Flowsheet Row Cardiac Rehab from 12/11/2021 in [facility] Cardiac and Pulmonary Rehab  Date 11/06/21  Educator [facility]  Instruction Review Code 1- [employer] Understanding       Education: Sleep Hygiene -Provides group verbal and written instruction about how sleep can affect your health.  Define sleep hygiene, discuss sleep cycles and impact of sleep habits. Review good sleep hygiene tips.    Initial Review & Psychosocial Screening:  Initial Psych Review & Screening - 02/02/23 1317       Initial Review   Current issues with None Identified      Family Dynamics   Good Support System? Yes   wife, family, friends, church family     Barriers   Psychosocial barriers to participate in program There are no identifiable barriers or psychosocial needs.      Screening Interventions   Interventions Encouraged to exercise;To provide support and resources with identified psychosocial needs;Provide feedback about the scores to participant    Expected Outcomes Short Term goal: Utilizing psychosocial counselor, staff and physician to assist with identification of specific Stressors or current issues interfering with healing process. Setting desired goal for each stressor or current issue identified.;Long Term Goal: Stressors or current issues are controlled or eliminated.;Short Term goal: Identification and review with participant of any Quality of Life or Depression concerns found by  scoring the questionnaire.;Long Term goal: The participant improves quality of Life and PHQ9 Scores as seen by post scores and/or verbalization of changes             Quality of Life Scores:   Scores of 19 and below usually indicate a poorer quality of life in these areas.  A difference of  2-3 points is a clinically meaningful difference.  A difference of 2-3 points in the total score of the Quality of Life Index has been associated with significant improvement in overall quality of life, self-image, physical symptoms, and general health in studies assessing change in quality of life.  PHQ-9: Review Flowsheet  More data exists      02/10/2023 12/29/2021 12/08/2021 11/13/2021 10/21/2021  Depression screen PHQ 2/9  Decreased Interest 1 0 0 1 2  Down, Depressed, Hopeless 0 0 1 0 0  PHQ - 2 Score 1 0 1 1 2   Altered sleeping 2 1 2 2 2   Tired, decreased energy 3 2 2 2 3   Change in appetite 0 0 0 1 2  Feeling bad or failure about yourself  0 0 0 0 0  Trouble concentrating 0 0 1 0 0  Moving slowly or fidgety/restless 1 0 1 0 1  Suicidal thoughts 0 0 0 0 0  PHQ-9 Score 7 3 7 6 10   Difficult doing work/chores Somewhat difficult Somewhat difficult Somewhat difficult Somewhat difficult Somewhat difficult    Details           Interpretation of Total Score  Total Score Depression Severity:  1-4 = Minimal depression, 5-9 = Mild depression, 10-14 = Moderate depression, 15-19 = Moderately severe depression, 20-27 = Severe depression   Psychosocial Evaluation and Intervention:  Psychosocial Evaluation - 02/02/23 1323  Psychosocial Evaluation & Interventions   Interventions Encouraged to exercise with the program and follow exercise prescription    Comments Umair 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 He reports that prior to returning to work this week, he was stressed about being on short term disability and the process of getting back into work. However, his boss and  coworkers have been very supportive and working with him every step to make sure his transition back to full time is smooth. He is very grateful for his wife, family, and friends as they have been a big support. He has been working on losing weight and is proud of his progress. He wants to continue working on his weight and risk factor management.    Expected Outcomes Short: attend cardiac rehab for education and exercise. Long: develop and maintain positive self care habits.    Continue Psychosocial Services  Follow up required by staff             Psychosocial Re-Evaluation:   Psychosocial Discharge (Final Psychosocial Re-Evaluation):   Vocational Rehabilitation: Provide vocational rehab assistance to qualifying candidates.   Vocational Rehab Evaluation & Intervention:  Vocational Rehab - 02/02/23 1315       Initial Vocational Rehab Evaluation & Intervention   Assessment shows need for Vocational Rehabilitation No             Education: Education Goals: Education classes will be provided on a variety of topics geared toward better understanding of heart health and risk factor modification. Participant will state understanding/return demonstration of topics presented as noted by education test scores.  Learning Barriers/Preferences:  Learning Barriers/Preferences - 02/02/23 1323       Learning Barriers/Preferences   Learning Barriers None    Learning Preferences None             General Cardiac Education Topics:  AED/CPR: - Group verbal and written instruction with the use of models to demonstrate the basic use of the AED with the basic ABC's of resuscitation.   Anatomy and Cardiac Procedures: - Group verbal and visual presentation and models provide information about basic cardiac anatomy and function. Reviews the testing methods done to diagnose heart disease and the outcomes of the test results. Describes the treatment choices: Medical Management,  Angioplasty, or Coronary Bypass Surgery for treating various heart conditions including Myocardial Infarction, Angina, Valve Disease, and Cardiac Arrhythmias.  Written material given at graduation. Flowsheet Row Cardiac Rehab from 12/11/2021 in [facility] Cardiac and Pulmonary Rehab  Education need identified 10/21/21  Date 11/27/21  Educator SB  Instruction Review Code 1- Verbalizes Understanding       Medication Safety: - Group verbal and visual instruction to review commonly prescribed medications for heart and lung disease. Reviews the medication, class of the drug, and side effects. Includes the steps to properly store meds and maintain the prescription regimen.  Written material given at graduation.   Intimacy: - Group verbal instruction through game format to discuss how heart and lung disease can affect sexual intimacy. Written material given at graduation..   Know Your Numbers and Heart Failure: - Group verbal and visual instruction to discuss disease risk factors for cardiac and pulmonary disease and treatment options.  Reviews associated critical values for Overweight/Obesity, Hypertension, Cholesterol, and Diabetes.  Discusses basics of heart failure: signs/symptoms and treatments.  Introduces Heart Failure Zone chart for action plan for heart failure.  Written material given at graduation.   Infection Prevention: - Provides verbal and written material  to individual with discussion of infection control including proper hand washing and proper equipment cleaning during exercise session. Flowsheet Row Cardiac Rehab from 02/10/2023 in [facility] Cardiac and Pulmonary Rehab  Date 02/10/23  Educator MB  Instruction Review Code 1- Verbalizes Understanding       Falls Prevention: - Provides verbal and written material to individual with discussion of falls prevention and safety. Flowsheet Row Cardiac Rehab from 02/10/2023 in [facility] Cardiac and Pulmonary Rehab  Date 02/10/23  Educator MB   Instruction Review Code 1- Verbalizes Understanding       Other: -Provides group and verbal instruction on various topics (see comments)   Knowledge Questionnaire Score:   Core Components/Risk Factors/Patient Goals at Admission:  Personal Goals and Risk Factors at Admission - 02/10/23 1012       Core Components/Risk Factors/Patient Goals on Admission    Weight Management Yes;Obesity;Weight Loss    Intervention Weight Management: Develop a combined nutrition and exercise program designed to reach desired caloric intake, while maintaining appropriate intake of nutrient and fiber, sodium and fats, and appropriate energy expenditure required for the weight goal.;Weight Management/Obesity: Establish reasonable short term and long term weight goals.;Obesity: Provide education and appropriate resources to help participant work on and attain dietary goals.;Weight Management: Provide education and appropriate resources to help participant work on and attain dietary goals.    Admit Weight 300 lb 3.2 oz (136.2 kg)    Goal Weight: Short Term 260 lb (117.9 kg)    Goal Weight: Long Term 210 lb (95.3 kg)    Expected Outcomes Short Term: Continue to assess and modify interventions until short term weight is achieved;Long Term: Adherence to nutrition and physical activity/exercise program aimed toward attainment of established weight goal;Weight Loss: Understanding of general recommendations for a balanced deficit meal plan, which promotes 1-2 lb weight loss per week and includes a negative energy balance of [phone] kcal/d;Understanding recommendations for meals to include 15-35% energy as protein, 25-35% energy from fat, 35-60% energy from carbohydrates, less than 200mg  of dietary cholesterol, 20-35 gm of total fiber daily;Understanding of distribution of calorie intake throughout the day with the consumption of 4-5 meals/snacks    Diabetes Yes    Intervention Provide education about signs/symptoms and  action to take for hypo/hyperglycemia.;Provide education about proper nutrition, including hydration, and aerobic/resistive exercise prescription along with prescribed medications to achieve blood glucose in normal ranges: Fasting glucose 65-99 mg/dL    Expected Outcomes Short Term: Participant verbalizes understanding of the signs/symptoms and immediate care of hyper/hypoglycemia, proper foot care and importance of medication, aerobic/resistive exercise and nutrition plan for blood glucose control.;Long Term: Attainment of HbA1C < 7%.    Heart Failure Yes    Intervention Provide a combined exercise and nutrition program that is supplemented with education, support and counseling about heart failure. Directed toward relieving symptoms such as shortness of breath, decreased exercise tolerance, and extremity edema.    Expected Outcomes Short term: Attendance in program 2-3 days a week with increased exercise capacity. Reported lower sodium intake. Reported increased fruit and vegetable intake. Reports medication compliance.;Improve functional capacity of life;Short term: Daily weights obtained and reported for increase. Utilizing diuretic protocols set by physician.;Long term: Adoption of self-care skills and reduction of barriers for early signs and symptoms recognition and intervention leading to self-care maintenance.    Hypertension Yes    Intervention Provide education on lifestyle modifcations including regular physical activity/exercise, weight management, moderate sodium restriction and increased consumption of fresh fruit, vegetables, and low fat dairy,  alcohol moderation, and smoking cessation.;Monitor prescription use compliance.    Expected Outcomes Short Term: Continued assessment and intervention until BP is < 140/57mm HG in hypertensive participants. < 130/53mm HG in hypertensive participants with diabetes, heart failure or chronic kidney disease.;Long Term: Maintenance of blood pressure at goal  levels.    Lipids Yes    Intervention Provide education and support for participant on nutrition & aerobic/resistive exercise along with prescribed medications to achieve LDL 70mg , HDL >40mg .    Expected Outcomes Short Term: Participant states understanding of desired cholesterol values and is compliant with medications prescribed. Participant is following exercise prescription and nutrition guidelines.;Long Term: Cholesterol controlled with medications as prescribed, with individualized exercise RX and with personalized nutrition plan. Value goals: LDL < 70mg , HDL > 40 mg.             Education:Diabetes - Individual verbal and written instruction to review signs/symptoms of diabetes, desired ranges of glucose level fasting, after meals and with exercise. Acknowledge that pre and post exercise glucose checks will be done for 3 sessions at entry of program. Flowsheet Row Cardiac Rehab from 02/10/2023 in [facility] Cardiac and Pulmonary Rehab  Date 02/10/23  Educator MB  Instruction Review Code 1- Verbalizes Understanding       Core Components/Risk Factors/Patient Goals Review:    Core Components/Risk Factors/Patient Goals at Discharge (Final Review):    ITP Comments:  ITP Comments     Row Name 02/10/23 1002 02/15/23 0809 02/17/23 1220       ITP Comments Completed and gym orientation. Initial ITP created and sent for review to Dr. Bethann Punches, Medical Director. First full day of exercise!  Patient was oriented to gym and equipment including functions, settings, policies, and procedures.  Patient's individual exercise prescription and treatment plan were reviewed.  All starting workloads were established based on the results of the 6 minute walk test done at initial orientation visit.  The plan for exercise progression was also introduced and progression will be customized based on patient's performance and goals. 30 Day review completed. Medical Director ITP review done, changes made as  directed, and signed approval by Medical Director.    new to program              Comments:

## 2023-02-18 ENCOUNTER — Encounter: Payer: BLUE CROSS/BLUE SHIELD | Attending: Cardiovascular Disease | Admitting: *Deleted

## 2023-02-18 DIAGNOSIS — Z48812 Encounter for surgical aftercare following surgery on the circulatory system: Secondary | ICD-10-CM | POA: Diagnosis not present

## 2023-02-18 DIAGNOSIS — Z952 Presence of prosthetic heart valve: Secondary | ICD-10-CM

## 2023-02-18 LAB — GLUCOSE, CAPILLARY
Glucose-Capillary: 139 mg/dL — ABNORMAL HIGH (ref 70–99)
Glucose-Capillary: 176 mg/dL — ABNORMAL HIGH (ref 70–99)

## 2023-02-18 NOTE — Progress Notes (Signed)
Daily Session Note  Patient Details  Name: Brett Marshall MRN: [id number] Date of Birth: 1966/03/11 Referring Provider:   Flowsheet Row Cardiac Rehab from 02/10/2023 in [facility] Cardiac and Pulmonary Rehab  Referring Provider Lorine Bears, MD       Encounter Date: 02/18/2023  Check In:  Session Check In - 02/18/23 0754       Check-In   Supervising physician immediately available to respond to emergencies See telemetry face sheet for immediately available ER MD    Location [facility]-Cardiac & Pulmonary Rehab    Staff Present Elige Ko, RCP,RRT,BSRT;Maxon Conetta BS, , Exercise Physiologist;Mical Kicklighter Katrinka Blazing, RN, ADN    Virtual Visit No    Medication changes reported     No    Fall or balance concerns reported    No    Warm-up and Cool-down Performed on first and last piece of equipment    Resistance Training Performed Yes    VAD Patient? No    PAD/SET Patient? No      Pain Assessment   Currently in Pain? No/denies                Social History   Tobacco Use  Smoking Status Never  Smokeless Tobacco Never    Goals Met:  Independence with exercise equipment Exercise tolerated well No report of concerns or symptoms today Strength training completed today  Goals Unmet:  Not Applicable  Comments: Pt able to follow exercise prescription today without complaint.  Will continue to monitor for progression.    Dr. Bethann Punches is Medical Director for [facility] Cardiac Rehabilitation.  Dr. Vida Rigger is Medical Director for [facility] Pulmonary Rehabilitation.

## 2023-02-19 DIAGNOSIS — E7800 Pure hypercholesterolemia, unspecified: Secondary | ICD-10-CM | POA: Diagnosis not present

## 2023-02-19 DIAGNOSIS — Z23 Encounter for immunization: Secondary | ICD-10-CM | POA: Diagnosis not present

## 2023-02-19 DIAGNOSIS — E1142 Type 2 diabetes mellitus with diabetic polyneuropathy: Secondary | ICD-10-CM | POA: Diagnosis not present

## 2023-02-19 DIAGNOSIS — I10 Essential (primary) hypertension: Secondary | ICD-10-CM | POA: Diagnosis not present

## 2023-02-19 DIAGNOSIS — D62 Acute posthemorrhagic anemia: Secondary | ICD-10-CM | POA: Diagnosis not present

## 2023-02-19 DIAGNOSIS — Z952 Presence of prosthetic heart valve: Secondary | ICD-10-CM | POA: Diagnosis not present

## 2023-02-22 ENCOUNTER — Encounter: Payer: BLUE CROSS/BLUE SHIELD | Admitting: *Deleted

## 2023-02-25 ENCOUNTER — Other Ambulatory Visit: Payer: Self-pay

## 2023-02-25 ENCOUNTER — Encounter: Payer: BLUE CROSS/BLUE SHIELD | Admitting: *Deleted

## 2023-02-25 DIAGNOSIS — Z952 Presence of prosthetic heart valve: Secondary | ICD-10-CM

## 2023-02-25 DIAGNOSIS — Z48812 Encounter for surgical aftercare following surgery on the circulatory system: Secondary | ICD-10-CM | POA: Diagnosis not present

## 2023-02-25 MED ORDER — METOPROLOL TARTRATE 25 MG PO TABS: 12.5000 mg | ORAL_TABLET | Freq: Two times a day (BID) | ORAL | 1 refills | Status: DC

## 2023-02-25 NOTE — Progress Notes (Signed)
Daily Session Note  Patient Details  Name: Brett Marshall MRN: [id number] Date of Birth: [birth year] Referring Provider:   Flowsheet Row Cardiac Rehab from 02/10/2023 in [facility] Cardiac and Pulmonary Rehab  Referring Provider Lorine Bears, MD       Encounter Date: 02/25/2023  Check In:  Session Check In - 02/25/23 0740       Check-In   Supervising physician immediately available to respond to emergencies See telemetry face sheet for immediately available ER MD    Location [facility]-Cardiac & Pulmonary Rehab    Staff Present Ronette Deter, BS, Exercise Physiologist;Joseph [place], RCP,RRT,BSRT;Maxon [place] BS, , Exercise Physiologist;Tyeisha Dinan Katrinka Blazing, RN, [place]    Virtual Visit No    Medication changes reported     No    Fall or balance concerns reported    No    Warm-up and Cool-down Performed on first and last piece of equipment    Resistance Training Performed Yes    VAD Patient? No    PAD/SET Patient? No      Pain Assessment   Currently in Pain? No/denies                Social History   Tobacco Use  Smoking Status Never  Smokeless Tobacco Never    Goals Met:  Independence with exercise equipment Exercise tolerated well No report of concerns or symptoms today Strength training completed today  Goals Unmet:  Not Applicable  Comments: Pt able to follow exercise prescription today without complaint.  Will continue to monitor for progression.    Dr. Bethann Punches is Medical Director for [facility] Cardiac Rehabilitation.  Dr. Vida Rigger is Medical Director for [facility] Pulmonary Rehabilitation.

## 2023-03-01 ENCOUNTER — Encounter: Payer: BLUE CROSS/BLUE SHIELD | Admitting: *Deleted

## 2023-03-01 DIAGNOSIS — Z48812 Encounter for surgical aftercare following surgery on the circulatory system: Secondary | ICD-10-CM | POA: Diagnosis not present

## 2023-03-01 DIAGNOSIS — Z952 Presence of prosthetic heart valve: Secondary | ICD-10-CM

## 2023-03-01 NOTE — Progress Notes (Signed)
Daily Session Note  Patient Details  Name: Brett Marshall MRN: [id number] Date of Birth: 10/24/1965 Referring Provider:   Flowsheet Row Cardiac Rehab from 02/10/2023 in [facility] Cardiac and Pulmonary Rehab  Referring Provider Lorine Bears, MD       Encounter Date: 03/01/2023  Check In:  Session Check In - 03/01/23 0819       Check-In   Supervising physician immediately available to respond to emergencies See telemetry face sheet for immediately available ER MD    Location [facility]-Cardiac & Pulmonary Rehab    Staff Present Lanny Hurst, RN, Franki Monte, BS, ACSM CEP, Exercise Physiologist;Joseph Terie Purser, RDN, LDN    Virtual Visit No    Medication changes reported     No    Fall or balance concerns reported    No    Warm-up and Cool-down Performed on first and last piece of equipment    Resistance Training Performed Yes    VAD Patient? No    PAD/SET Patient? No      Pain Assessment   Currently in Pain? No/denies                Social History   Tobacco Use  Smoking Status Never  Smokeless Tobacco Never    Goals Met:  Independence with exercise equipment Exercise tolerated well No report of concerns or symptoms today Strength training completed today  Goals Unmet:  Not Applicable  Comments: Pt able to follow exercise prescription today without complaint.  Will continue to monitor for progression.    Dr. Bethann Punches is Medical Director for [facility] Cardiac Rehabilitation.  Dr. Vida Rigger is Medical Director for [facility] Pulmonary Rehabilitation.

## 2023-03-04 ENCOUNTER — Encounter: Payer: BLUE CROSS/BLUE SHIELD | Admitting: *Deleted

## 2023-03-04 DIAGNOSIS — Z952 Presence of prosthetic heart valve: Secondary | ICD-10-CM | POA: Diagnosis not present

## 2023-03-04 DIAGNOSIS — Z48812 Encounter for surgical aftercare following surgery on the circulatory system: Secondary | ICD-10-CM | POA: Diagnosis not present

## 2023-03-04 NOTE — Progress Notes (Signed)
Daily Session Note  Patient Details  Name: Brett Marshall MRN: [id number] Date of Birth: January 15, 1966 Referring Provider:   Flowsheet Row Cardiac Rehab from 02/10/2023 in [facility] Cardiac and Pulmonary Rehab  Referring Provider Lorine Bears, MD       Encounter Date: 03/04/2023  Check In:  Session Check In - 03/04/23 0729       Check-In   Supervising physician immediately available to respond to emergencies See telemetry face sheet for immediately available ER MD    Location [facility]-Cardiac & Pulmonary Rehab    Staff Present Elige Ko, RCP,RRT,BSRT;Maxon Conetta BS, , Exercise Physiologist;Susanne Bice, RN, BSN, CCRP;Sheyanne Munley Katrinka Blazing, RN, ADN    Virtual Visit No    Medication changes reported     No    Fall or balance concerns reported    No    Warm-up and Cool-down Performed on first and last piece of equipment    Resistance Training Performed Yes    VAD Patient? No    PAD/SET Patient? No      Pain Assessment   Currently in Pain? No/denies                Social History   Tobacco Use  Smoking Status Never  Smokeless Tobacco Never    Goals Met:  Independence with exercise equipment Exercise tolerated well No report of concerns or symptoms today Strength training completed today  Goals Unmet:  Not Applicable  Comments: Pt able to follow exercise prescription today without complaint.  Will continue to monitor for progression.    Dr. Bethann Punches is Medical Director for [facility] Cardiac Rehabilitation.  Dr. Vida Rigger is Medical Director for [facility] Pulmonary Rehabilitation.

## 2023-03-06 DIAGNOSIS — G4733 Obstructive sleep apnea (adult) (pediatric): Secondary | ICD-10-CM | POA: Diagnosis not present

## 2023-03-11 ENCOUNTER — Encounter: Payer: BLUE CROSS/BLUE SHIELD | Admitting: *Deleted

## 2023-03-15 ENCOUNTER — Other Ambulatory Visit: Payer: Self-pay

## 2023-03-15 MED ORDER — ASPIRIN 81 MG PO TBEC: 81.0000 mg | DELAYED_RELEASE_TABLET | Freq: Every day | ORAL | 0 refills | Status: AC

## 2023-03-15 NOTE — Telephone Encounter (Signed)
Requested Prescriptions   Signed Prescriptions Disp Refills   aspirin EC 81 MG tablet 90 tablet 0    Sig: Take 1 tablet (81 mg total) by mouth daily.    Authorizing Provider: Lorine Bears A    Ordering User: Guerry Minors

## 2023-03-15 NOTE — Telephone Encounter (Signed)
last visit: 01/15/23--Continue small dose aspirin-- with plan to f/u in 4 months.  Next visit: none/active recall

## 2023-03-17 ENCOUNTER — Encounter: Payer: Self-pay | Admitting: *Deleted

## 2023-03-17 DIAGNOSIS — Z952 Presence of prosthetic heart valve: Secondary | ICD-10-CM

## 2023-03-17 NOTE — Progress Notes (Signed)
Cardiac Individual Treatment Plan  Patient Details  Name: Brett Marshall MRN: [id number] Date of Birth: 01/11/1966 Referring Provider:   Flowsheet Row Cardiac Rehab from 02/10/2023 in [facility] Cardiac and Pulmonary Rehab  Referring Provider Lorine Bears, MD       Initial Encounter Date:  Flowsheet Row Cardiac Rehab from 02/10/2023 in [facility] Cardiac and Pulmonary Rehab  Date 02/10/23       Visit Diagnosis: S/P AVR (aortic valve replacement)  Patient's Home Medications on Admission:  Current Outpatient Medications:    acetaminophen (TYLENOL) 325 MG tablet, Take 650 mg by mouth every 6 (six) hours as needed for moderate pain., Disp: , Rfl:    albuterol (VENTOLIN HFA) 108 (90 Base) MCG/ACT inhaler, Inhale 1-2 puffs into the lungs every 6 (six) hours as needed for wheezing or shortness of breath., Disp: 18 g, Rfl: 1   aspirin EC 81 MG tablet, Take 1 tablet (81 mg total) by mouth daily., Disp: 90 tablet, Rfl: 0   atorvastatin (LIPITOR) 40 MG tablet, Take 1 tablet (40 mg total) by mouth daily at 6 PM., Disp: 30 tablet, Rfl: 2   cholecalciferol (CHOLECALCIFEROL) 25 MCG tablet, Take 1 tablet (1,000 Units total) by mouth daily., Disp: 30 tablet, Rfl: 0   cyanocobalamin (VITAMIN B12) 1000 MCG tablet, Take 1 tablet (1,000 mcg total) by mouth daily., Disp: , Rfl:    cyclobenzaprine (FLEXERIL) 10 MG tablet, Take 1 tablet (10 mg total) by mouth 3 (three) times daily as needed.For spasms (Patient taking differently: Take 30 mg by mouth daily as needed for muscle spasms. For spasms), Disp: 90 tablet, Rfl: 5   DULoxetine (CYMBALTA) 60 MG capsule, Take 120 mg by mouth at bedtime., Disp: , Rfl:    famotidine (PEPCID) 40 MG tablet, Take 40 mg by mouth at bedtime., Disp: , Rfl:    ferrous sulfate 325 (65 FE) MG EC tablet, Take 1 tablet (325 mg total) by mouth daily with breakfast., Disp: 30 tablet, Rfl: 0   fluticasone (FLONASE) 50 MCG/ACT nasal spray, Place 1 spray into both nostrils daily as needed for  allergies or rhinitis., Disp: , Rfl:    fluticasone-salmeterol (ADVAIR) 250-50 MCG/ACT AEPB, Inhale 1 puff into the lungs every 12 (twelve) hours., Disp: 60 each, Rfl: 6   furosemide (LASIX) 20 MG tablet, Take 1 tablet (20 mg total) by mouth daily as needed., Disp: 90 tablet, Rfl: 0   gabapentin (NEURONTIN) 300 MG capsule, Take 300 mg by mouth at bedtime., Disp: , Rfl:    glucose blood (GE100 BLOOD GLUCOSE TEST) test strip, Use as instructed, Disp: 100 each, Rfl: 12   ibuprofen (MOTRIN IB) 200 MG tablet, Take 2 tablets (400 mg total) by mouth 3 (three) times daily., Disp: 100 tablet, Rfl: 2   insulin glargine (LANTUS) 100 unit/mL SOPN, Inject 30 Units into the skin daily., Disp: 15 mL, Rfl: 11   JARDIANCE 25 MG TABS tablet, Take 25 mg by mouth daily., Disp: , Rfl:    Lancets (ONETOUCH ULTRASOFT) lancets, Check sugar twice daily, Disp: 100 each, Rfl: 12   lidocaine (LIDODERM) 5 %, Place 1 patch onto the skin daily. Remove & Discard patch within 12 hours or as directed by MD. May place on right or left side of chest (Patient taking differently: Place 1 patch onto the skin daily as needed (pain). Remove & Discard patch within 12 hours or as directed by MD. May place on right or left side of chest), Disp: 14 patch, Rfl: 0   metFORMIN (GLUCOPHAGE)  1000 MG tablet, Take 1 tablet (1,000 mg total) by mouth 2 (two) times daily with a meal., Disp: 180 tablet, Rfl: 1   metoprolol tartrate (LOPRESSOR) 25 MG tablet, Take 0.5 tablets (12.5 mg total) by mouth 2 (two) times daily., Disp: 90 tablet, Rfl: 1   traZODone (DESYREL) 50 MG tablet, Take 50 mg by mouth at bedtime., Disp: , Rfl:   Past Medical History: Past Medical History:  Diagnosis Date   Asthma    CAD (coronary artery disease)    a. 08/2017 Cath: Diff nonobs dzs, EF 45-50%, diff HK; b. 08/2021 MV: Ant/apical ishemia; c. 08/2021 PCI: LAD 95ost (4.0x15 Onyx Frontier DES), EF 55-65%; c. 09/2021 Cath: LM nl, LAD patent stent, 90m, 30d, RI min irregs, LCX min  irregs, RCA 50p. LVOT grad 50-79mmHg (rest), 70-12mmHg (provoked); d. 10/2022 Cath: LM nl, LAD 41m, 60d, RI min irregs, LCX min irregs, RCA 50p.   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 08/2017 LV gram: EF 45-50%, glob HK; b. 08/2021 Echo: EF 60-65%, no rwma, GrI DD, nl RV fxn; c. 09/2021 cMRI: EF 60%, no LVOT obstruction/gradient. Mild conc LVH, mild AS. No myocardial scar/fibrosis. No evidence of HCM.   Hyperlipidemia    Leg swelling    Left   Low back pain    Mixed ICM & NICM    a. 08/2017 LV gram: EF 45-50%, glob HK; b. 08/2021 Echo: EF 60-65%.   Morbid obesity (HCC)    OSA on CPAP    non compliant   Pulmonary hypertension (HCC)    Severe aortic stenosis    a. 10/2022 Cath: 31.61mmHg gradient and AVA 1.05cm^2; b. 10/2022 s/p 23mm Edwards Resilia bioprosthetic AoV, resection of sub-aortic membrane, and septal myectomy; c. 10/2022 Echo: EF 65-70%, no rwma, nl AoV structure/fxn.    Tobacco Use: Social History   Tobacco Use  Smoking Status Never  Smokeless Tobacco Never    Labs: Review Flowsheet  More data exists      Latest Ref Rng & Units 09/07/2022 09/08/2022 10/19/2022 10/28/2022 10/30/2022  Labs for ITP Cardiac and Pulmonary Rehab  Cholestrol 0 - 200 mg/dL - 161  - - -  LDL (calc) 0 - 99 mg/dL - 50  - - -  HDL-C >09 mg/dL - 31  - - -  Trlycerides <150 mg/dL - 604  - - -  Hemoglobin A1c 4.8 - 5.6 % 11.7  - - 10.0  -  PH, Arterial 7.35 - 7.45 - - 7.344  7.39  7.332  7.316  7.359  7.436  7.428   PCO2 arterial 32 - 48 mmHg - - 41.4  35  37.3  42.6  38.5  31.2  33.1   Bicarbonate 20.0 - 28.0 mmol/L - - 25.6  22.5  21.2  19.9  21.9  21.9  21.0  23.1  21.9   TCO2 22 - 32 mmol/L - - 27  24  - 21  23  23  22  22  26  26  24  23  23  26    Acid-base deficit 0.0 - 2.0  mmol/L - - 1.0  3.0  3.1  6.0  4.0  3.0  3.0  2.0  2.0   O2 Saturation % - - 61  93  100  98  99  99  99  67  100     Details       Multiple values from one day are sorted in reverse-chronological order          Exercise Target Goals: Exercise Program Goal: Individual exercise prescription set using results from initial 6 min walk test and THRR while considering  patient's activity barriers and safety.   Exercise Prescription Goal: Initial exercise prescription builds to 30-45 minutes a day of aerobic activity, 2-3 days per week.  Home exercise guidelines will be given to patient during program as part of exercise prescription that the participant will acknowledge.   Education: Aerobic Exercise: - Group verbal and visual presentation on the components of exercise prescription. Introduces F.I.T.T principle from ACSM for exercise prescriptions.  Reviews F.I.T.T. principles of aerobic exercise including progression. Written material given at graduation.   Education: Resistance Exercise: - Group verbal and visual presentation on the components of exercise prescription. Introduces F.I.T.T principle from ACSM for exercise prescriptions  Reviews F.I.T.T. principles of resistance exercise including progression. Written material given at graduation.    Education: Exercise & Equipment Safety: - Individual verbal instruction and demonstration of equipment use and safety with use of the equipment. Flowsheet Row Cardiac Rehab from 03/04/2023 in [facility] Cardiac and Pulmonary Rehab  Date 02/10/23  Educator MB  Instruction Review Code 1- Verbalizes Understanding       Education: Exercise Physiology & General Exercise Guidelines: - Group verbal and written instruction with models to review the exercise physiology of the cardiovascular system and associated critical values. Provides general exercise guidelines with specific guidelines to those with heart or lung disease.  Flowsheet Row Cardiac  Rehab from 03/04/2023 in [facility] Cardiac and Pulmonary Rehab  Date 03/04/23  Educator MB  Instruction Review Code 1- [employer] Understanding       Education: Flexibility, Balance, Mind/Body Relaxation: - Group verbal and visual presentation with interactive activity on the components of exercise prescription. Introduces F.I.T.T principle from ACSM for exercise prescriptions. Reviews F.I.T.T. principles of flexibility and balance exercise training including progression. Also discusses the mind body connection.  Reviews various relaxation techniques to help reduce and manage stress (i.e. Deep breathing, progressive muscle relaxation, and visualization). Balance handout provided to take home. Written material given at graduation.   Activity Barriers & Risk Stratification:  Activity Barriers & Cardiac Risk Stratification - 02/10/23 1004       Activity Barriers & Cardiac Risk Stratification   Activity Barriers Neck/Spine Problems;Shortness of Breath;Joint Problems;Back Problems    Cardiac Risk Stratification Moderate             6 Minute Walk:  6 Minute Walk     Row Name 02/10/23 1003         6 Minute Walk   Phase Initial     Distance 1050 feet     Walk Time 5.88 minutes     # of Rest Breaks 1     MPH 2.03     METS 2.73     RPE 14     Perceived Dyspnea  3     VO2 Peak 9.55     Symptoms Yes (comment)     Comments SOB     Resting HR 108 bpm     Resting BP 130/80     Resting Oxygen Saturation  100 %     Exercise Oxygen Saturation  during  6 min walk 100 %     Max Ex. HR 141 bpm     Max Ex. BP 142/90     2 Minute Post BP 132/90              Oxygen Initial Assessment:   Oxygen Re-Evaluation:   Oxygen Discharge (Final Oxygen Re-Evaluation):   Initial Exercise Prescription:  Initial Exercise Prescription - 02/10/23 1000       Date of Initial Exercise RX and Referring Provider   Date 02/10/23    Referring Provider Lorine Bears, MD      Oxygen   Maintain  Oxygen Saturation 88% or higher      Treadmill   MPH 2    Grade 0    Minutes 15    METs 2.53      Recumbant Bike   Level 2    RPM 50    Watts 31    Minutes 15    METs 2.73      REL-XR   Level 2    Speed 50    Minutes 15    METs 2.73      T5 Nustep   Level 2    SPM 80    Minutes 15    METs 2.73      Biostep-RELP   Level 2    SPM 50    Minutes 15    METs 2.73      Prescription Details   Frequency (times per week) 2    Duration Progress to 30 minutes of continuous aerobic without signs/symptoms of physical distress      Intensity   THRR 40-80% of Max Heartrate 130-152    Ratings of Perceived Exertion 11-13    Perceived Dyspnea 0-4      Progression   Progression Continue to progress workloads to maintain intensity without signs/symptoms of physical distress.      Resistance Training   Training Prescription Yes    Weight 5 lb    Reps 10-15             Perform Capillary Blood Glucose checks as needed.  Exercise Prescription Changes:   Exercise Prescription Changes     Row Name 02/10/23 1000 02/23/23 0800 03/10/23 1100         Response to Exercise   Blood Pressure (Admit) 130/80 108/74 146/68     Blood Pressure (Exercise) 142/90 136/64 162/82     Blood Pressure (Exit) 132/90 138/86 124/80     Heart Rate (Admit) 108 bpm 99 bpm 104 bpm     Heart Rate (Exercise) 141 bpm 132 bpm 142 bpm     Heart Rate (Exit) 114 bpm 111 bpm 115 bpm     Oxygen Saturation (Admit) 100 % -- --     Oxygen Saturation (Exercise) 100 % -- --     Oxygen Saturation (Exit) 100 % -- --     Rating of Perceived Exertion (Exercise) 14 16 15      Perceived Dyspnea (Exercise) 3 0 --     Symptoms SOB -- --     Comments results First 2 weeks of exercise --     Duration Progress to 30 minutes of  aerobic without signs/symptoms of physical distress Progress to 30 minutes of  aerobic without signs/symptoms of physical distress Progress to 30 minutes of  aerobic without  signs/symptoms of physical distress     Intensity THRR New THRR unchanged THRR unchanged       Progression  Progression Continue to progress workloads to maintain intensity without signs/symptoms of physical distress. Continue to progress workloads to maintain intensity without signs/symptoms of physical distress. Continue to progress workloads to maintain intensity without signs/symptoms of physical distress.     Average METs 2.73 2.45 2.5       Resistance Training   Training Prescription -- Yes Yes     Weight -- 5 lb 5 lb     Reps -- 10-15 10-15       Interval Training   Interval Training -- No No       Treadmill   MPH -- 2 1.9     Grade -- 0 0     Minutes -- 15 15     METs -- 2.53 2.45       Recumbant Bike   Level -- 3 3     Watts -- 26 26     Minutes -- 15 15     METs -- 2.61 2.6       REL-XR   Level -- 2 2     Minutes -- 15 15       T5 Nustep   Level -- 3 2     Minutes -- 15 15     METs -- 2.2 2.1       Biostep-RELP   Level -- -- 3     Minutes -- -- 15     METs -- -- 3       Oxygen   Maintain Oxygen Saturation -- 88% or higher 88% or higher              Exercise Comments:   Exercise Comments     Row Name 02/15/23 0809           Exercise Comments First full day of exercise!  Patient was oriented to gym and equipment including functions, settings, policies, and procedures.  Patient's individual exercise prescription and treatment plan were reviewed.  All starting workloads were established based on the results of the 6 minute walk test done at initial orientation visit.  The plan for exercise progression was also introduced and progression will be customized based on patient's performance and goals.                Exercise Goals and Review:   Exercise Goals     Row Name 02/10/23 1009             Exercise Goals   Increase Physical Activity Yes       Intervention Provide advice, education, support and counseling about physical  activity/exercise needs.;Develop an individualized exercise prescription for aerobic and resistive training based on initial evaluation findings, risk stratification, comorbidities and participant's personal goals.       Expected Outcomes Long Term: Add in home exercise to make exercise part of routine and to increase amount of physical activity.;Short Term: Attend rehab on a regular basis to increase amount of physical activity.;Long Term: Exercising regularly at least 3-5 days a week.       Increase Strength and Stamina Yes       Intervention Provide advice, education, support and counseling about physical activity/exercise needs.;Develop an individualized exercise prescription for aerobic and resistive training based on initial evaluation findings, risk stratification, comorbidities and participant's personal goals.       Expected Outcomes Short Term: Increase workloads from initial exercise prescription for resistance, speed, and METs.;Short Term: Perform resistance training exercises routinely during rehab and add in resistance training at  home;Long Term: Improve cardiorespiratory fitness, muscular endurance and strength as measured by increased METs and functional capacity ( )       Able to understand and use rate of perceived exertion (RPE) scale Yes       Intervention Provide education and explanation on how to use RPE scale       Expected Outcomes Short Term: Able to use RPE daily in rehab to express subjective intensity level;Long Term:  Able to use RPE to guide intensity level when exercising independently       Able to understand and use Dyspnea scale Yes       Intervention Provide education and explanation on how to use Dyspnea scale       Expected Outcomes Short Term: Able to use Dyspnea scale daily in rehab to express subjective sense of shortness of breath during exertion;Long Term: Able to use Dyspnea scale to guide intensity level when exercising independently       Knowledge and  understanding of Target Heart Rate Range (THRR) Yes       Intervention Provide education and explanation of THRR including how the numbers were predicted and where they are located for reference       Expected Outcomes Short Term: Able to state/look up THRR;Short Term: Able to use daily as guideline for intensity in rehab;Long Term: Able to use THRR to govern intensity when exercising independently       Able to check pulse independently Yes       Intervention Provide education and demonstration on how to check pulse in carotid and radial arteries.;Review the importance of being able to check your own pulse for safety during independent exercise       Expected Outcomes Short Term: Able to explain why pulse checking is important during independent exercise;Long Term: Able to check pulse independently and accurately       Understanding of Exercise Prescription Yes       Intervention Provide education, explanation, and written materials on patient's individual exercise prescription       Expected Outcomes Short Term: Able to explain program exercise prescription;Long Term: Able to explain home exercise prescription to exercise independently                Exercise Goals Re-Evaluation :  Exercise Goals Re-Evaluation     Row Name 02/15/23 0809 02/23/23 0819 03/10/23 1148         Exercise Goal Re-Evaluation   Exercise Goals Review Able to understand and use rate of perceived exertion (RPE) scale;Able to understand and use Dyspnea scale;Knowledge and understanding of Target Heart Rate Range (THRR);Understanding of Exercise Prescription Increase Physical Activity;Increase Strength and Stamina;Understanding of Exercise Prescription Increase Physical Activity;Increase Strength and Stamina;Understanding of Exercise Prescription     Comments Reviewed RPE and dyspnea scale, THR and program prescription with pt today.  Pt voiced understanding and was given a copy of goals to take home. Redge continues to  do well in rehab. He continues to get aquainted to the exercise machines that are prescribed. He was able to use the recumbent bike and T5 nustep at level 3. He was also able to use the treadmill at an intensity of 2 mph with no incline. We will continue to monitor his progress in the program. Khalil is doing well in rehab. His treadmill workload has stayed consistent at 1.9-2.0 mph with no incline. He also has stayed consistent with his workloads on the T5 nustep, recumbent bike, and XR. He did improve to level  3 on the biostep. We will continue to monitor his progress in the program.     Expected Outcomes Short: Use RPE daily to regulate intensity. Long: Follow program prescription in THR. Short: Continue to follow current exercise prescription. Long: Continue exercise to improve strength and stamina. Short: Begin to progressively increase treadmill workload. Long: Continue exercise to improve strength and stamina.              Discharge Exercise Prescription (Final Exercise Prescription Changes):  Exercise Prescription Changes - 03/10/23 1100       Response to Exercise   Blood Pressure (Admit) 146/68    Blood Pressure (Exercise) 162/82    Blood Pressure (Exit) 124/80    Heart Rate (Admit) 104 bpm    Heart Rate (Exercise) 142 bpm    Heart Rate (Exit) 115 bpm    Rating of Perceived Exertion (Exercise) 15    Duration Progress to 30 minutes of  aerobic without signs/symptoms of physical distress    Intensity THRR unchanged      Progression   Progression Continue to progress workloads to maintain intensity without signs/symptoms of physical distress.    Average METs 2.5      Resistance Training   Training Prescription Yes    Weight 5 lb    Reps 10-15      Interval Training   Interval Training No      Treadmill   MPH 1.9    Grade 0    Minutes 15    METs 2.45      Recumbant Bike   Level 3    Watts 26    Minutes 15    METs 2.6      REL-XR   Level 2    Minutes 15      T5  Nustep   Level 2    Minutes 15    METs 2.1      Biostep-RELP   Level 3    Minutes 15    METs 3      Oxygen   Maintain Oxygen Saturation 88% or higher             Nutrition:  Target Goals: Understanding of nutrition guidelines, daily intake of sodium 1500mg , cholesterol 200mg , calories 30% from fat and 7% or less from saturated fats, daily to have 5 or more servings of fruits and vegetables.  Education: All About Nutrition: -Group instruction provided by verbal, written material, interactive activities, discussions, models, and posters to present general guidelines for heart healthy nutrition including fat, fiber, MyPlate, the role of sodium in heart healthy nutrition, utilization of the nutrition label, and utilization of this knowledge for meal planning. Follow up email sent as well. Written material given at graduation. Flowsheet Row Cardiac Rehab from 12/11/2021 in [facility] Cardiac and Pulmonary Rehab  Education need identified 10/21/21  Date 12/11/21  Educator MC  Instruction Review Code 1- Verbalizes Understanding       Biometrics:  Pre Biometrics - 02/10/23 1010       Pre Biometrics   Height 5' 9.7" (1.77 m)    Weight 300 lb 3.2 oz (136.2 kg)    Waist Circumference 54.5 inches    Hip Circumference 55 inches    Waist to Hip Ratio 0.99 %    BMI (Calculated) 43.46    Single Leg Stand 5 seconds              Nutrition Therapy Plan and Nutrition Goals:  Nutrition Therapy & Goals - 02/15/23  9562       Nutrition Therapy   Diet Carb controlled, Cardiac, Low Na    Protein (specify units) 90    Fiber 30 grams    Whole Grain Foods 3 servings    Saturated Fats 15 max. grams    Fruits and Vegetables 5 servings/day    Sodium 2 grams      Personal Nutrition Goals   Nutrition Goal Write down on paper and low 1-2 days on app per week    Personal Goal #2 Watch carb sources, aim for ~30-60g per meal    Personal Goal #3 Drink 3 bottles per day, cut back on diet soda     Comments Patient drinking ~32oz of water, drinks diet soda as well. He reports occasionally drinking juice. His A1C is 10.0% as of 10/28/22. Spoke with him about this, he is somewhat knowledgeable about carbs. He knew about simple sugars vs complex carbs, carb sources, and the goal of ~45g per meal. Asked if he counted carbs, he reported he didn't, says he would choose meals that were easiest and that both he and his wife likes. Encouraged him to be mindful of carb portions provided some examples of appropriate portions of carbs at his meals. Built out several meals and snacks with foods he likes and will eat, focusing on consistent carb portions (~30-60g each meal) with high quality carbs paired with lean protein. He likes veggies, encouraged him to eat them in large portions to help control carb portions on his plates. Provided and reviewed several Mediterranean diet handouts to promote more heart healthy eating. Educated on types of fats, sodium, and whole grains. He is optimistic he can make some of these changes. Will continue to monitor and encouraged him to meet with RD again in a few weeks      Intervention Plan   Intervention Prescribe, educate and counsel regarding individualized specific dietary modifications aiming towards targeted core components such as weight, hypertension, lipid management, diabetes, heart failure and other comorbidities.;Nutrition handout(s) given to patient.    Expected Outcomes Short Term Goal: Understand basic principles of dietary content, such as calories, fat, sodium, cholesterol and nutrients.;Short Term Goal: A plan has been developed with personal nutrition goals set during dietitian appointment.;Long Term Goal: Adherence to prescribed nutrition plan.             Nutrition Assessments:  MEDIFICTS Score Key: >=70 Need to make dietary changes  40-70 Heart Healthy Diet <= 40 Therapeutic Level Cholesterol Diet  Flowsheet Row Cardiac Rehab from 03/01/2023  in [facility] Cardiac and Pulmonary Rehab  Picture Your Plate Total Score on Admission 45      Picture Your Plate Scores: <13 Unhealthy dietary pattern with much room for improvement. 41-50 Dietary pattern unlikely to meet recommendations for good health and room for improvement. 51-60 More healthful dietary pattern, with some room for improvement.  >60 Healthy dietary pattern, although there may be some specific behaviors that could be improved.    Nutrition Goals Re-Evaluation:   Nutrition Goals Discharge (Final Nutrition Goals Re-Evaluation):   Psychosocial: Target Goals: Acknowledge presence or absence of significant depression and/or stress, maximize coping skills, provide positive support system. Participant is able to verbalize types and ability to use techniques and skills needed for reducing stress and depression.   Education: Stress, Anxiety, and Depression - Group verbal and visual presentation to define topics covered.  Reviews how body is impacted by stress, anxiety, and depression.  Also discusses healthy ways to reduce stress  and to treat/manage anxiety and depression.  Written material given at graduation. Flowsheet Row Cardiac Rehab from 03/04/2023 in [facility] Cardiac and Pulmonary Rehab  Date 02/25/23  Educator SB  Instruction Review Code 1- [employer] Understanding       Education: Sleep Hygiene -Provides group verbal and written instruction about how sleep can affect your health.  Define sleep hygiene, discuss sleep cycles and impact of sleep habits. Review good sleep hygiene tips.    Initial Review & Psychosocial Screening:  Initial Psych Review & Screening - 02/02/23 1317       Initial Review   Current issues with None Identified      Family Dynamics   Good Support System? Yes   wife, family, friends, church family     Barriers   Psychosocial barriers to participate in program There are no identifiable barriers or psychosocial needs.      Screening  Interventions   Interventions Encouraged to exercise;To provide support and resources with identified psychosocial needs;Provide feedback about the scores to participant    Expected Outcomes Short Term goal: Utilizing psychosocial counselor, staff and physician to assist with identification of specific Stressors or current issues interfering with healing process. Setting desired goal for each stressor or current issue identified.;Long Term Goal: Stressors or current issues are controlled or eliminated.;Short Term goal: Identification and review with participant of any Quality of Life or Depression concerns found by scoring the questionnaire.;Long Term goal: The participant improves quality of Life and PHQ9 Scores as seen by post scores and/or verbalization of changes             Quality of Life Scores:   Quality of Life - 03/01/23 1005       Quality of Life   Select Quality of Life      Quality of Life Scores   Health/Function Pre 14.13 %    Socioeconomic Pre 19.71 %    Psych/Spiritual Pre 18 %    Family Pre 19.5 %    GLOBAL Pre 16.79 %            Scores of 19 and below usually indicate a poorer quality of life in these areas.  A difference of  2-3 points is a clinically meaningful difference.  A difference of 2-3 points in the total score of the Quality of Life Index has been associated with significant improvement in overall quality of life, self-image, physical symptoms, and general health in studies assessing change in quality of life.  PHQ-9: Review Flowsheet  More data exists      02/10/2023 12/29/2021 12/08/2021 11/13/2021 10/21/2021  Depression screen PHQ 2/9  Decreased Interest 1 0 0 1 2  Down, Depressed, Hopeless 0 0 1 0 0  PHQ - 2 Score 1 0 1 1 2   Altered sleeping 2 1 2 2 2   Tired, decreased energy 3 2 2 2 3   Change in appetite 0 0 0 1 2  Feeling bad or failure about yourself  0 0 0 0 0  Trouble concentrating 0 0 1 0 0  Moving slowly or fidgety/restless 1 0 1 0 1   Suicidal thoughts 0 0 0 0 0  PHQ-9 Score 7 3 7 6 10   Difficult doing work/chores Somewhat difficult Somewhat difficult Somewhat difficult Somewhat difficult Somewhat difficult    Details           Interpretation of Total Score  Total Score Depression Severity:  1-4 = Minimal depression, 5-9 = Mild depression, 10-14 = Moderate depression,  15-19 = Moderately severe depression, 20-27 = Severe depression   Psychosocial Evaluation and Intervention:  Psychosocial Evaluation - 02/02/23 1323       Psychosocial Evaluation & Interventions   Interventions Encouraged to exercise with the program and follow exercise prescription    Comments Gaylen 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 He reports that prior to returning to work this week, he was stressed about being on short term disability and the process of getting back into work. However, his boss and coworkers have been very supportive and working with him every step to make sure his transition back to full time is smooth. He is very grateful for his wife, family, and friends as they have been a big support. He has been working on losing weight and is proud of his progress. He wants to continue working on his weight and risk factor management.    Expected Outcomes Short: attend cardiac rehab for education and exercise. Long: develop and maintain positive self care habits.    Continue Psychosocial Services  Follow up required by staff             Psychosocial Re-Evaluation:   Psychosocial Discharge (Final Psychosocial Re-Evaluation):   Vocational Rehabilitation: Provide vocational rehab assistance to qualifying candidates.   Vocational Rehab Evaluation & Intervention:  Vocational Rehab - 02/02/23 1315       Initial Vocational Rehab Evaluation & Intervention   Assessment shows need for Vocational Rehabilitation No             Education: Education Goals: Education classes will be provided on a variety of  topics geared toward better understanding of heart health and risk factor modification. Participant will state understanding/return demonstration of topics presented as noted by education test scores.  Learning Barriers/Preferences:  Learning Barriers/Preferences - 02/02/23 1323       Learning Barriers/Preferences   Learning Barriers None    Learning Preferences None             General Cardiac Education Topics:  AED/CPR: - Group verbal and written instruction with the use of models to demonstrate the basic use of the AED with the basic ABC's of resuscitation.   Anatomy and Cardiac Procedures: - Group verbal and visual presentation and models provide information about basic cardiac anatomy and function. Reviews the testing methods done to diagnose heart disease and the outcomes of the test results. Describes the treatment choices: Medical Management, Angioplasty, or Coronary Bypass Surgery for treating various heart conditions including Myocardial Infarction, Angina, Valve Disease, and Cardiac Arrhythmias.  Written material given at graduation. Flowsheet Row Cardiac Rehab from 12/11/2021 in [facility] Cardiac and Pulmonary Rehab  Education need identified 10/21/21  Date 11/27/21  Educator SB  Instruction Review Code 1- Verbalizes Understanding       Medication Safety: - Group verbal and visual instruction to review commonly prescribed medications for heart and lung disease. Reviews the medication, class of the drug, and side effects. Includes the steps to properly store meds and maintain the prescription regimen.  Written material given at graduation.   Intimacy: - Group verbal instruction through game format to discuss how heart and lung disease can affect sexual intimacy. Written material given at graduation..   Know Your Numbers and Heart Failure: - Group verbal and visual instruction to discuss disease risk factors for cardiac and pulmonary disease and treatment options.   Reviews associated critical values for Overweight/Obesity, Hypertension, Cholesterol, and Diabetes.  Discusses basics of heart failure: signs/symptoms and treatments.  Introduces Heart Failure Zone chart for action plan for heart failure.  Written material given at graduation. Flowsheet Row Cardiac Rehab from 03/04/2023 in [facility] Cardiac and Pulmonary Rehab  Date 02/18/23  Educator SB  Instruction Review Code 1- Verbalizes Understanding       Infection Prevention: - Provides verbal and written material to individual with discussion of infection control including proper hand washing and proper equipment cleaning during exercise session. Flowsheet Row Cardiac Rehab from 03/04/2023 in [facility] Cardiac and Pulmonary Rehab  Date 02/10/23  Educator MB  Instruction Review Code 1- Verbalizes Understanding       Falls Prevention: - Provides verbal and written material to individual with discussion of falls prevention and safety. Flowsheet Row Cardiac Rehab from 03/04/2023 in [facility] Cardiac and Pulmonary Rehab  Date 02/10/23  Educator MB  Instruction Review Code 1- Verbalizes Understanding       Other: -Provides group and verbal instruction on various topics (see comments)   Knowledge Questionnaire Score:  Knowledge Questionnaire Score - 03/01/23 1003       Knowledge Questionnaire Score   Pre Score 22/26             Core Components/Risk Factors/Patient Goals at Admission:  Personal Goals and Risk Factors at Admission - 02/10/23 1012       Core Components/Risk Factors/Patient Goals on Admission    Weight Management Yes;Obesity;Weight Loss    Intervention Weight Management: Develop a combined nutrition and exercise program designed to reach desired caloric intake, while maintaining appropriate intake of nutrient and fiber, sodium and fats, and appropriate energy expenditure required for the weight goal.;Weight Management/Obesity: Establish reasonable short term and long term weight  goals.;Obesity: Provide education and appropriate resources to help participant work on and attain dietary goals.;Weight Management: Provide education and appropriate resources to help participant work on and attain dietary goals.    Admit Weight 300 lb 3.2 oz (136.2 kg)    Goal Weight: Short Term 260 lb (117.9 kg)    Goal Weight: Long Term 210 lb (95.3 kg)    Expected Outcomes Short Term: Continue to assess and modify interventions until short term weight is achieved;Long Term: Adherence to nutrition and physical activity/exercise program aimed toward attainment of established weight goal;Weight Loss: Understanding of general recommendations for a balanced deficit meal plan, which promotes 1-2 lb weight loss per week and includes a negative energy balance of [phone] kcal/d;Understanding recommendations for meals to include 15-35% energy as protein, 25-35% energy from fat, 35-60% energy from carbohydrates, less than 200mg  of dietary cholesterol, 20-35 gm of total fiber daily;Understanding of distribution of calorie intake throughout the day with the consumption of 4-5 meals/snacks    Diabetes Yes    Intervention Provide education about signs/symptoms and action to take for hypo/hyperglycemia.;Provide education about proper nutrition, including hydration, and aerobic/resistive exercise prescription along with prescribed medications to achieve blood glucose in normal ranges: Fasting glucose 65-99 mg/dL    Expected Outcomes Short Term: Participant verbalizes understanding of the signs/symptoms and immediate care of hyper/hypoglycemia, proper foot care and importance of medication, aerobic/resistive exercise and nutrition plan for blood glucose control.;Long Term: Attainment of HbA1C < 7%.    Heart Failure Yes    Intervention Provide a combined exercise and nutrition program that is supplemented with education, support and counseling about heart failure. Directed toward relieving symptoms such as shortness of  breath, decreased exercise tolerance, and extremity edema.    Expected Outcomes Short term: Attendance in program 2-3 days a week with increased exercise  capacity. Reported lower sodium intake. Reported increased fruit and vegetable intake. Reports medication compliance.;Improve functional capacity of life;Short term: Daily weights obtained and reported for increase. Utilizing diuretic protocols set by physician.;Long term: Adoption of self-care skills and reduction of barriers for early signs and symptoms recognition and intervention leading to self-care maintenance.    Hypertension Yes    Intervention Provide education on lifestyle modifcations including regular physical activity/exercise, weight management, moderate sodium restriction and increased consumption of fresh fruit, vegetables, and low fat dairy, alcohol moderation, and smoking cessation.;Monitor prescription use compliance.    Expected Outcomes Short Term: Continued assessment and intervention until BP is < 140/58mm HG in hypertensive participants. < 130/65mm HG in hypertensive participants with diabetes, heart failure or chronic kidney disease.;Long Term: Maintenance of blood pressure at goal levels.    Lipids Yes    Intervention Provide education and support for participant on nutrition & aerobic/resistive exercise along with prescribed medications to achieve LDL 70mg , HDL >40mg .    Expected Outcomes Short Term: Participant states understanding of desired cholesterol values and is compliant with medications prescribed. Participant is following exercise prescription and nutrition guidelines.;Long Term: Cholesterol controlled with medications as prescribed, with individualized exercise RX and with personalized nutrition plan. Value goals: LDL < 70mg , HDL > 40 mg.             Education:Diabetes - Individual verbal and written instruction to review signs/symptoms of diabetes, desired ranges of glucose level fasting, after meals and with  exercise. Acknowledge that pre and post exercise glucose checks will be done for 3 sessions at entry of program. Flowsheet Row Cardiac Rehab from 03/04/2023 in [facility] Cardiac and Pulmonary Rehab  Date 02/10/23  Educator MB  Instruction Review Code 1- Verbalizes Understanding       Core Components/Risk Factors/Patient Goals Review:    Core Components/Risk Factors/Patient Goals at Discharge (Final Review):    ITP Comments:  ITP Comments     Row Name 02/10/23 1002 02/15/23 0809 02/17/23 1220 03/17/23 1310     ITP Comments Completed and gym orientation. Initial ITP created and sent for review to Dr. Bethann Punches, Medical Director. First full day of exercise!  Patient was oriented to gym and equipment including functions, settings, policies, and procedures.  Patient's individual exercise prescription and treatment plan were reviewed.  All starting workloads were established based on the results of the 6 minute walk test done at initial orientation visit.  The plan for exercise progression was also introduced and progression will be customized based on patient's performance and goals. 30 Day review completed. Medical Director ITP review done, changes made as directed, and signed approval by Medical Director.    new to program 30 Day review completed. Medical Director ITP review done, changes made as directed, and signed approval by Medical Director.             Comments:

## 2023-03-25 ENCOUNTER — Encounter: Payer: BLUE CROSS/BLUE SHIELD | Admitting: *Deleted

## 2023-03-29 ENCOUNTER — Encounter: Payer: BLUE CROSS/BLUE SHIELD | Attending: Cardiovascular Disease | Admitting: *Deleted

## 2023-03-29 DIAGNOSIS — Z952 Presence of prosthetic heart valve: Secondary | ICD-10-CM | POA: Diagnosis present

## 2023-03-29 NOTE — Progress Notes (Signed)
Daily Session Note  Patient Details  Name: Brett Marshall MRN: [id number] Date of Birth: May 08, 1966 Referring Provider:   Flowsheet Row Cardiac Rehab from 02/10/2023 in [facility] Cardiac and Pulmonary Rehab  Referring Provider Lorine Bears, MD       Encounter Date: 03/29/2023  Check In:  Session Check In - 03/29/23 0755       Check-In   Supervising physician immediately available to respond to emergencies See telemetry face sheet for immediately available ER MD    Location [facility]-Cardiac & Pulmonary Rehab    Staff Present Elige Ko, Guinevere Ferrari, RN, Franki Monte, BS, ACSM CEP, Exercise Physiologist;Jason Wallace Cullens, RDN, LDN    Virtual Visit No    Medication changes reported     No    Fall or balance concerns reported    No    Warm-up and Cool-down Performed on first and last piece of equipment    Resistance Training Performed Yes    VAD Patient? No    PAD/SET Patient? No      Pain Assessment   Currently in Pain? No/denies                Social History   Tobacco Use  Smoking Status Never  Smokeless Tobacco Never    Goals Met:  Independence with exercise equipment Exercise tolerated well No report of concerns or symptoms today Strength training completed today  Goals Unmet:  Not Applicable  Comments: Pt able to follow exercise prescription today without complaint.  Will continue to monitor for progression.    Dr. Bethann Punches is Medical Director for [facility] Cardiac Rehabilitation.  Dr. Vida Rigger is Medical Director for [facility] Pulmonary Rehabilitation.

## 2023-04-05 ENCOUNTER — Encounter: Payer: BLUE CROSS/BLUE SHIELD | Admitting: *Deleted

## 2023-04-07 ENCOUNTER — Encounter: Payer: Self-pay | Admitting: *Deleted

## 2023-04-07 DIAGNOSIS — Z952 Presence of prosthetic heart valve: Secondary | ICD-10-CM

## 2023-04-07 NOTE — Progress Notes (Signed)
Cardiac Individual Treatment Plan  Patient Details  Name: Brett Marshall MRN: [id number] Date of Birth: 1965-09-14 Referring Provider:   Flowsheet Row Cardiac Rehab from 02/10/2023 in [facility] Cardiac and Pulmonary Rehab  Referring Provider Lorine Bears, MD       Initial Encounter Date:  Flowsheet Row Cardiac Rehab from 02/10/2023 in [facility] Cardiac and Pulmonary Rehab  Date 02/10/23       Visit Diagnosis: S/P AVR (aortic valve replacement)  Patient's Home Medications on Admission:  Current Outpatient Medications:    acetaminophen (TYLENOL) 325 MG tablet, Take 650 mg by mouth every 6 (six) hours as needed for moderate pain., Disp: , Rfl:    albuterol (VENTOLIN HFA) 108 (90 Base) MCG/ACT inhaler, Inhale 1-2 puffs into the lungs every 6 (six) hours as needed for wheezing or shortness of breath., Disp: 18 g, Rfl: 1   aspirin EC 81 MG tablet, Take 1 tablet (81 mg total) by mouth daily., Disp: 90 tablet, Rfl: 0   atorvastatin (LIPITOR) 40 MG tablet, Take 1 tablet (40 mg total) by mouth daily at 6 PM., Disp: 30 tablet, Rfl: 2   cholecalciferol (CHOLECALCIFEROL) 25 MCG tablet, Take 1 tablet (1,000 Units total) by mouth daily., Disp: 30 tablet, Rfl: 0   cyanocobalamin (VITAMIN B12) 1000 MCG tablet, Take 1 tablet (1,000 mcg total) by mouth daily., Disp: , Rfl:    cyclobenzaprine (FLEXERIL) 10 MG tablet, Take 1 tablet (10 mg total) by mouth 3 (three) times daily as needed.For spasms (Patient taking differently: Take 30 mg by mouth daily as needed for muscle spasms. For spasms), Disp: 90 tablet, Rfl: 5   DULoxetine (CYMBALTA) 60 MG capsule, Take 120 mg by mouth at bedtime., Disp: , Rfl:    famotidine (PEPCID) 40 MG tablet, Take 40 mg by mouth at bedtime., Disp: , Rfl:    ferrous sulfate 325 (65 FE) MG EC tablet, Take 1 tablet (325 mg total) by mouth daily with breakfast., Disp: 30 tablet, Rfl: 0   fluticasone (FLONASE) 50 MCG/ACT nasal spray, Place 1 spray into both nostrils daily as needed for  allergies or rhinitis., Disp: , Rfl:    fluticasone-salmeterol (ADVAIR) 250-50 MCG/ACT AEPB, Inhale 1 puff into the lungs every 12 (twelve) hours., Disp: 60 each, Rfl: 6   furosemide (LASIX) 20 MG tablet, Take 1 tablet (20 mg total) by mouth daily as needed., Disp: 90 tablet, Rfl: 0   gabapentin (NEURONTIN) 300 MG capsule, Take 300 mg by mouth at bedtime., Disp: , Rfl:    glucose blood (GE100 BLOOD GLUCOSE TEST) test strip, Use as instructed, Disp: 100 each, Rfl: 12   ibuprofen (MOTRIN IB) 200 MG tablet, Take 2 tablets (400 mg total) by mouth 3 (three) times daily., Disp: 100 tablet, Rfl: 2   insulin glargine (LANTUS) 100 unit/mL SOPN, Inject 30 Units into the skin daily., Disp: 15 mL, Rfl: 11   JARDIANCE 25 MG TABS tablet, Take 25 mg by mouth daily., Disp: , Rfl:    Lancets (ONETOUCH ULTRASOFT) lancets, Check sugar twice daily, Disp: 100 each, Rfl: 12   lidocaine (LIDODERM) 5 %, Place 1 patch onto the skin daily. Remove & Discard patch within 12 hours or as directed by MD. May place on right or left side of chest (Patient taking differently: Place 1 patch onto the skin daily as needed (pain). Remove & Discard patch within 12 hours or as directed by MD. May place on right or left side of chest), Disp: 14 patch, Rfl: 0   metFORMIN (GLUCOPHAGE)  1000 MG tablet, Take 1 tablet (1,000 mg total) by mouth 2 (two) times daily with a meal., Disp: 180 tablet, Rfl: 1   metoprolol tartrate (LOPRESSOR) 25 MG tablet, Take 0.5 tablets (12.5 mg total) by mouth 2 (two) times daily., Disp: 90 tablet, Rfl: 1   traZODone (DESYREL) 50 MG tablet, Take 50 mg by mouth at bedtime., Disp: , Rfl:   Past Medical History: Past Medical History:  Diagnosis Date   Asthma    CAD (coronary artery disease)    a. 08/2017 Cath: Diff nonobs dzs, EF 45-50%, diff HK; b. 08/2021 MV: Ant/apical ishemia; c. 08/2021 PCI: LAD 95ost (4.0x15 Onyx Frontier DES), EF 55-65%; c. 09/2021 Cath: LM nl, LAD patent stent, 108m, 30d, RI min irregs, LCX min  irregs, RCA 50p. LVOT grad 50-66mmHg (rest), 70-112mmHg (provoked); d. 10/2022 Cath: LM nl, LAD 74m, 60d, RI min irregs, LCX min irregs, RCA 50p.   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 08/2017 LV gram: EF 45-50%, glob HK; b. 08/2021 Echo: EF 60-65%, no rwma, GrI DD, nl RV fxn; c. 09/2021 cMRI: EF 60%, no LVOT obstruction/gradient. Mild conc LVH, mild AS. No myocardial scar/fibrosis. No evidence of HCM.   Hyperlipidemia    Leg swelling    Left   Low back pain    Mixed ICM & NICM    a. 08/2017 LV gram: EF 45-50%, glob HK; b. 08/2021 Echo: EF 60-65%.   Morbid obesity (HCC)    OSA on CPAP    non compliant   Pulmonary hypertension (HCC)    Severe aortic stenosis    a. 10/2022 Cath: 31.27mmHg gradient and AVA 1.05cm^2; b. 10/2022 s/p 23mm Edwards Resilia bioprosthetic AoV, resection of sub-aortic membrane, and septal myectomy; c. 10/2022 Echo: EF 65-70%, no rwma, nl AoV structure/fxn.    Tobacco Use: Social History   Tobacco Use  Smoking Status Never  Smokeless Tobacco Never    Labs: Review Flowsheet  More data exists      Latest Ref Rng & Units 09/07/2022 09/08/2022 10/19/2022 10/28/2022 10/30/2022  Labs for ITP Cardiac and Pulmonary Rehab  Cholestrol 0 - 200 mg/dL - 829  - - -  LDL (calc) 0 - 99 mg/dL - 50  - - -  HDL-C >56 mg/dL - 31  - - -  Trlycerides <150 mg/dL - 213  - - -  Hemoglobin A1c 4.8 - 5.6 % 11.7  - - 10.0  -  PH, Arterial 7.35 - 7.45 - - 7.344  7.39  7.332  7.316  7.359  7.436  7.428   PCO2 arterial 32 - 48 mmHg - - 41.4  35  37.3  42.6  38.5  31.2  33.1   Bicarbonate 20.0 - 28.0 mmol/L - - 25.6  22.5  21.2  19.9  21.9  21.9  21.0  23.1  21.9   TCO2 22 - 32 mmol/L - - 27  24  - 21  23  23  22  22  26  26  24  23  23  26    Acid-base deficit 0.0 - 2.0  mmol/L - - 1.0  3.0  3.1  6.0  4.0  3.0  3.0  2.0  2.0   O2 Saturation % - - 61  93  100  98  99  99  99  67  100     Details       Multiple values from one day are sorted in reverse-chronological order          Exercise Target Goals: Exercise Program Goal: Individual exercise prescription set using results from initial 6 min walk test and THRR while considering  patient's activity barriers and safety.   Exercise Prescription Goal: Initial exercise prescription builds to 30-45 minutes a day of aerobic activity, 2-3 days per week.  Home exercise guidelines will be given to patient during program as part of exercise prescription that the participant will acknowledge.   Education: Aerobic Exercise: - Group verbal and visual presentation on the components of exercise prescription. Introduces F.I.T.T principle from ACSM for exercise prescriptions.  Reviews F.I.T.T. principles of aerobic exercise including progression. Written material given at graduation.   Education: Resistance Exercise: - Group verbal and visual presentation on the components of exercise prescription. Introduces F.I.T.T principle from ACSM for exercise prescriptions  Reviews F.I.T.T. principles of resistance exercise including progression. Written material given at graduation.    Education: Exercise & Equipment Safety: - Individual verbal instruction and demonstration of equipment use and safety with use of the equipment. Flowsheet Row Cardiac Rehab from 03/04/2023 in [facility] Cardiac and Pulmonary Rehab  Date 02/10/23  Educator MB  Instruction Review Code 1- Verbalizes Understanding       Education: Exercise Physiology & General Exercise Guidelines: - Group verbal and written instruction with models to review the exercise physiology of the cardiovascular system and associated critical values. Provides general exercise guidelines with specific guidelines to those with heart or lung disease.  Flowsheet Row Cardiac  Rehab from 03/04/2023 in [facility] Cardiac and Pulmonary Rehab  Date 03/04/23  Educator MB  Instruction Review Code 1- [employer] Understanding       Education: Flexibility, Balance, Mind/Body Relaxation: - Group verbal and visual presentation with interactive activity on the components of exercise prescription. Introduces F.I.T.T principle from ACSM for exercise prescriptions. Reviews F.I.T.T. principles of flexibility and balance exercise training including progression. Also discusses the mind body connection.  Reviews various relaxation techniques to help reduce and manage stress (i.e. Deep breathing, progressive muscle relaxation, and visualization). Balance handout provided to take home. Written material given at graduation.   Activity Barriers & Risk Stratification:  Activity Barriers & Cardiac Risk Stratification - 02/10/23 1004       Activity Barriers & Cardiac Risk Stratification   Activity Barriers Neck/Spine Problems;Shortness of Breath;Joint Problems;Back Problems    Cardiac Risk Stratification Moderate             6 Minute Walk:  6 Minute Walk     Row Name 02/10/23 1003         6 Minute Walk   Phase Initial     Distance 1050 feet     Walk Time 5.88 minutes     # of Rest Breaks 1     MPH 2.03     METS 2.73     RPE 14     Perceived Dyspnea  3     VO2 Peak 9.55     Symptoms Yes (comment)     Comments SOB     Resting HR 108 bpm     Resting BP 130/80     Resting Oxygen Saturation  100 %     Exercise Oxygen Saturation  during  6 min walk 100 %     Max Ex. HR 141 bpm     Max Ex. BP 142/90     2 Minute Post BP 132/90              Oxygen Initial Assessment:   Oxygen Re-Evaluation:   Oxygen Discharge (Final Oxygen Re-Evaluation):   Initial Exercise Prescription:  Initial Exercise Prescription - 02/10/23 1000       Date of Initial Exercise RX and Referring Provider   Date 02/10/23    Referring Provider Lorine Bears, MD      Oxygen   Maintain  Oxygen Saturation 88% or higher      Treadmill   MPH 2    Grade 0    Minutes 15    METs 2.53      Recumbant Bike   Level 2    RPM 50    Watts 31    Minutes 15    METs 2.73      REL-XR   Level 2    Speed 50    Minutes 15    METs 2.73      T5 Nustep   Level 2    SPM 80    Minutes 15    METs 2.73      Biostep-RELP   Level 2    SPM 50    Minutes 15    METs 2.73      Prescription Details   Frequency (times per week) 2    Duration Progress to 30 minutes of continuous aerobic without signs/symptoms of physical distress      Intensity   THRR 40-80% of Max Heartrate 130-152    Ratings of Perceived Exertion 11-13    Perceived Dyspnea 0-4      Progression   Progression Continue to progress workloads to maintain intensity without signs/symptoms of physical distress.      Resistance Training   Training Prescription Yes    Weight 5 lb    Reps 10-15             Perform Capillary Blood Glucose checks as needed.  Exercise Prescription Changes:   Exercise Prescription Changes     Row Name 02/10/23 1000 02/23/23 0800 03/10/23 1100         Response to Exercise   Blood Pressure (Admit) 130/80 108/74 146/68     Blood Pressure (Exercise) 142/90 136/64 162/82     Blood Pressure (Exit) 132/90 138/86 124/80     Heart Rate (Admit) 108 bpm 99 bpm 104 bpm     Heart Rate (Exercise) 141 bpm 132 bpm 142 bpm     Heart Rate (Exit) 114 bpm 111 bpm 115 bpm     Oxygen Saturation (Admit) 100 % -- --     Oxygen Saturation (Exercise) 100 % -- --     Oxygen Saturation (Exit) 100 % -- --     Rating of Perceived Exertion (Exercise) 14 16 15      Perceived Dyspnea (Exercise) 3 0 --     Symptoms SOB -- --     Comments results First 2 weeks of exercise --     Duration Progress to 30 minutes of  aerobic without signs/symptoms of physical distress Progress to 30 minutes of  aerobic without signs/symptoms of physical distress Progress to 30 minutes of  aerobic without  signs/symptoms of physical distress     Intensity THRR New THRR unchanged THRR unchanged       Progression  Progression Continue to progress workloads to maintain intensity without signs/symptoms of physical distress. Continue to progress workloads to maintain intensity without signs/symptoms of physical distress. Continue to progress workloads to maintain intensity without signs/symptoms of physical distress.     Average METs 2.73 2.45 2.5       Resistance Training   Training Prescription -- Yes Yes     Weight -- 5 lb 5 lb     Reps -- 10-15 10-15       Interval Training   Interval Training -- No No       Treadmill   MPH -- 2 1.9     Grade -- 0 0     Minutes -- 15 15     METs -- 2.53 2.45       Recumbant Bike   Level -- 3 3     Watts -- 26 26     Minutes -- 15 15     METs -- 2.61 2.6       REL-XR   Level -- 2 2     Minutes -- 15 15       T5 Nustep   Level -- 3 2     Minutes -- 15 15     METs -- 2.2 2.1       Biostep-RELP   Level -- -- 3     Minutes -- -- 15     METs -- -- 3       Oxygen   Maintain Oxygen Saturation -- 88% or higher 88% or higher              Exercise Comments:   Exercise Comments     Row Name 02/15/23 0809           Exercise Comments First full day of exercise!  Patient was oriented to gym and equipment including functions, settings, policies, and procedures.  Patient's individual exercise prescription and treatment plan were reviewed.  All starting workloads were established based on the results of the 6 minute walk test done at initial orientation visit.  The plan for exercise progression was also introduced and progression will be customized based on patient's performance and goals.                Exercise Goals and Review:   Exercise Goals     Row Name 02/10/23 1009             Exercise Goals   Increase Physical Activity Yes       Intervention Provide advice, education, support and counseling about physical  activity/exercise needs.;Develop an individualized exercise prescription for aerobic and resistive training based on initial evaluation findings, risk stratification, comorbidities and participant's personal goals.       Expected Outcomes Long Term: Add in home exercise to make exercise part of routine and to increase amount of physical activity.;Short Term: Attend rehab on a regular basis to increase amount of physical activity.;Long Term: Exercising regularly at least 3-5 days a week.       Increase Strength and Stamina Yes       Intervention Provide advice, education, support and counseling about physical activity/exercise needs.;Develop an individualized exercise prescription for aerobic and resistive training based on initial evaluation findings, risk stratification, comorbidities and participant's personal goals.       Expected Outcomes Short Term: Increase workloads from initial exercise prescription for resistance, speed, and METs.;Short Term: Perform resistance training exercises routinely during rehab and add in resistance training at  home;Long Term: Improve cardiorespiratory fitness, muscular endurance and strength as measured by increased METs and functional capacity ( )       Able to understand and use rate of perceived exertion (RPE) scale Yes       Intervention Provide education and explanation on how to use RPE scale       Expected Outcomes Short Term: Able to use RPE daily in rehab to express subjective intensity level;Long Term:  Able to use RPE to guide intensity level when exercising independently       Able to understand and use Dyspnea scale Yes       Intervention Provide education and explanation on how to use Dyspnea scale       Expected Outcomes Short Term: Able to use Dyspnea scale daily in rehab to express subjective sense of shortness of breath during exertion;Long Term: Able to use Dyspnea scale to guide intensity level when exercising independently       Knowledge and  understanding of Target Heart Rate Range (THRR) Yes       Intervention Provide education and explanation of THRR including how the numbers were predicted and where they are located for reference       Expected Outcomes Short Term: Able to state/look up THRR;Short Term: Able to use daily as guideline for intensity in rehab;Long Term: Able to use THRR to govern intensity when exercising independently       Able to check pulse independently Yes       Intervention Provide education and demonstration on how to check pulse in carotid and radial arteries.;Review the importance of being able to check your own pulse for safety during independent exercise       Expected Outcomes Short Term: Able to explain why pulse checking is important during independent exercise;Long Term: Able to check pulse independently and accurately       Understanding of Exercise Prescription Yes       Intervention Provide education, explanation, and written materials on patient's individual exercise prescription       Expected Outcomes Short Term: Able to explain program exercise prescription;Long Term: Able to explain home exercise prescription to exercise independently                Exercise Goals Re-Evaluation :  Exercise Goals Re-Evaluation     Row Name 02/15/23 0809 02/23/23 0819 03/10/23 1148 03/24/23 1521       Exercise Goal Re-Evaluation   Exercise Goals Review Able to understand and use rate of perceived exertion (RPE) scale;Able to understand and use Dyspnea scale;Knowledge and understanding of Target Heart Rate Range (THRR);Understanding of Exercise Prescription Increase Physical Activity;Increase Strength and Stamina;Understanding of Exercise Prescription Increase Physical Activity;Increase Strength and Stamina;Understanding of Exercise Prescription Increase Physical Activity;Increase Strength and Stamina;Understanding of Exercise Prescription    Comments Reviewed RPE and dyspnea scale, THR and program  prescription with pt today.  Pt voiced understanding and was given a copy of goals to take home. Ermin continues to do well in rehab. He continues to get aquainted to the exercise machines that are prescribed. He was able to use the recumbent bike and T5 nustep at level 3. He was also able to use the treadmill at an intensity of 2 mph with no incline. We will continue to monitor his progress in the program. Barre is doing well in rehab. His treadmill workload has stayed consistent at 1.9-2.0 mph with no incline. He also has stayed consistent with his workloads on the T5 nustep, recumbent  bike, and XR. He did improve to level 3 on the biostep. We will continue to monitor his progress in the program. Acxel has not attended rehab during this review. We will continue to reach out to make sure he is well. We will continue to monitor his progress in the program.    Expected Outcomes Short: Use RPE daily to regulate intensity. Long: Follow program prescription in THR. Short: Continue to follow current exercise prescription. Long: Continue exercise to improve strength and stamina. Short: Begin to progressively increase treadmill workload. Long: Continue exercise to improve strength and stamina. Short: Return to rehab. Long: Continue to exercise to improve strength and stamina.             Discharge Exercise Prescription (Final Exercise Prescription Changes):  Exercise Prescription Changes - 03/10/23 1100       Response to Exercise   Blood Pressure (Admit) 146/68    Blood Pressure (Exercise) 162/82    Blood Pressure (Exit) 124/80    Heart Rate (Admit) 104 bpm    Heart Rate (Exercise) 142 bpm    Heart Rate (Exit) 115 bpm    Rating of Perceived Exertion (Exercise) 15    Duration Progress to 30 minutes of  aerobic without signs/symptoms of physical distress    Intensity THRR unchanged      Progression   Progression Continue to progress workloads to maintain intensity without signs/symptoms of physical  distress.    Average METs 2.5      Resistance Training   Training Prescription Yes    Weight 5 lb    Reps 10-15      Interval Training   Interval Training No      Treadmill   MPH 1.9    Grade 0    Minutes 15    METs 2.45      Recumbant Bike   Level 3    Watts 26    Minutes 15    METs 2.6      REL-XR   Level 2    Minutes 15      T5 Nustep   Level 2    Minutes 15    METs 2.1      Biostep-RELP   Level 3    Minutes 15    METs 3      Oxygen   Maintain Oxygen Saturation 88% or higher             Nutrition:  Target Goals: Understanding of nutrition guidelines, daily intake of sodium 1500mg , cholesterol 200mg , calories 30% from fat and 7% or less from saturated fats, daily to have 5 or more servings of fruits and vegetables.  Education: All About Nutrition: -Group instruction provided by verbal, written material, interactive activities, discussions, models, and posters to present general guidelines for heart healthy nutrition including fat, fiber, MyPlate, the role of sodium in heart healthy nutrition, utilization of the nutrition label, and utilization of this knowledge for meal planning. Follow up email sent as well. Written material given at graduation. Flowsheet Row Cardiac Rehab from 12/11/2021 in [facility] Cardiac and Pulmonary Rehab  Education need identified 10/21/21  Date 12/11/21  Educator MC  Instruction Review Code 1- Verbalizes Understanding       Biometrics:  Pre Biometrics - 02/10/23 1010       Pre Biometrics   Height 5' 9.7" (1.77 m)    Weight 300 lb 3.2 oz (136.2 kg)    Waist Circumference 54.5 inches    Hip Circumference 55 inches  Waist to Hip Ratio 0.99 %    BMI (Calculated) 43.46    Single Leg Stand 5 seconds              Nutrition Therapy Plan and Nutrition Goals:  Nutrition Therapy & Goals - 02/15/23 0946       Nutrition Therapy   Diet Carb controlled, Cardiac, Low Na    Protein (specify units) 90    Fiber 30 grams     Whole Grain Foods 3 servings    Saturated Fats 15 max. grams    Fruits and Vegetables 5 servings/day    Sodium 2 grams      Personal Nutrition Goals   Nutrition Goal Write down on paper and low 1-2 days on app per week    Personal Goal #2 Watch carb sources, aim for ~30-60g per meal    Personal Goal #3 Drink 3 bottles per day, cut back on diet soda    Comments Patient drinking ~32oz of water, drinks diet soda as well. He reports occasionally drinking juice. His A1C is 10.0% as of 10/28/22. Spoke with him about this, he is somewhat knowledgeable about carbs. He knew about simple sugars vs complex carbs, carb sources, and the goal of ~45g per meal. Asked if he counted carbs, he reported he didn't, says he would choose meals that were easiest and that both he and his wife likes. Encouraged him to be mindful of carb portions provided some examples of appropriate portions of carbs at his meals. Built out several meals and snacks with foods he likes and will eat, focusing on consistent carb portions (~30-60g each meal) with high quality carbs paired with lean protein. He likes veggies, encouraged him to eat them in large portions to help control carb portions on his plates. Provided and reviewed several Mediterranean diet handouts to promote more heart healthy eating. Educated on types of fats, sodium, and whole grains. He is optimistic he can make some of these changes. Will continue to monitor and encouraged him to meet with RD again in a few weeks      Intervention Plan   Intervention Prescribe, educate and counsel regarding individualized specific dietary modifications aiming towards targeted core components such as weight, hypertension, lipid management, diabetes, heart failure and other comorbidities.;Nutrition handout(s) given to patient.    Expected Outcomes Short Term Goal: Understand basic principles of dietary content, such as calories, fat, sodium, cholesterol and nutrients.;Short Term Goal: A  plan has been developed with personal nutrition goals set during dietitian appointment.;Long Term Goal: Adherence to prescribed nutrition plan.             Nutrition Assessments:  MEDIFICTS Score Key: >=70 Need to make dietary changes  40-70 Heart Healthy Diet <= 40 Therapeutic Level Cholesterol Diet  Flowsheet Row Cardiac Rehab from 03/01/2023 in [facility] Cardiac and Pulmonary Rehab  Picture Your Plate Total Score on Admission 45      Picture Your Plate Scores: <40 Unhealthy dietary pattern with much room for improvement. 41-50 Dietary pattern unlikely to meet recommendations for good health and room for improvement. 51-60 More healthful dietary pattern, with some room for improvement.  >60 Healthy dietary pattern, although there may be some specific behaviors that could be improved.    Nutrition Goals Re-Evaluation:   Nutrition Goals Discharge (Final Nutrition Goals Re-Evaluation):   Psychosocial: Target Goals: Acknowledge presence or absence of significant depression and/or stress, maximize coping skills, provide positive support system. Participant is able to verbalize types and ability to  use techniques and skills needed for reducing stress and depression.   Education: Stress, Anxiety, and Depression - Group verbal and visual presentation to define topics covered.  Reviews how body is impacted by stress, anxiety, and depression.  Also discusses healthy ways to reduce stress and to treat/manage anxiety and depression.  Written material given at graduation. Flowsheet Row Cardiac Rehab from 03/04/2023 in [facility] Cardiac and Pulmonary Rehab  Date 02/25/23  Educator SB  Instruction Review Code 1- [employer] Understanding       Education: Sleep Hygiene -Provides group verbal and written instruction about how sleep can affect your health.  Define sleep hygiene, discuss sleep cycles and impact of sleep habits. Review good sleep hygiene tips.    Initial Review & Psychosocial  Screening:  Initial Psych Review & Screening - 02/02/23 1317       Initial Review   Current issues with None Identified      Family Dynamics   Good Support System? Yes   wife, family, friends, church family     Barriers   Psychosocial barriers to participate in program There are no identifiable barriers or psychosocial needs.      Screening Interventions   Interventions Encouraged to exercise;To provide support and resources with identified psychosocial needs;Provide feedback about the scores to participant    Expected Outcomes Short Term goal: Utilizing psychosocial counselor, staff and physician to assist with identification of specific Stressors or current issues interfering with healing process. Setting desired goal for each stressor or current issue identified.;Long Term Goal: Stressors or current issues are controlled or eliminated.;Short Term goal: Identification and review with participant of any Quality of Life or Depression concerns found by scoring the questionnaire.;Long Term goal: The participant improves quality of Life and PHQ9 Scores as seen by post scores and/or verbalization of changes             Quality of Life Scores:   Quality of Life - 03/01/23 1005       Quality of Life   Select Quality of Life      Quality of Life Scores   Health/Function Pre 14.13 %    Socioeconomic Pre 19.71 %    Psych/Spiritual Pre 18 %    Family Pre 19.5 %    GLOBAL Pre 16.79 %            Scores of 19 and below usually indicate a poorer quality of life in these areas.  A difference of  2-3 points is a clinically meaningful difference.  A difference of 2-3 points in the total score of the Quality of Life Index has been associated with significant improvement in overall quality of life, self-image, physical symptoms, and general health in studies assessing change in quality of life.  PHQ-9: Review Flowsheet  More data exists      02/10/2023 12/29/2021 12/08/2021 11/13/2021  10/21/2021  Depression screen PHQ 2/9  Decreased Interest 1 0 0 1 2  Down, Depressed, Hopeless 0 0 1 0 0  PHQ - 2 Score 1 0 1 1 2   Altered sleeping 2 1 2 2 2   Tired, decreased energy 3 2 2 2 3   Change in appetite 0 0 0 1 2  Feeling bad or failure about yourself  0 0 0 0 0  Trouble concentrating 0 0 1 0 0  Moving slowly or fidgety/restless 1 0 1 0 1  Suicidal thoughts 0 0 0 0 0  PHQ-9 Score 7 3 7 6 10   Difficult doing work/chores  Somewhat difficult Somewhat difficult Somewhat difficult Somewhat difficult Somewhat difficult    Details           Interpretation of Total Score  Total Score Depression Severity:  1-4 = Minimal depression, 5-9 = Mild depression, 10-14 = Moderate depression, 15-19 = Moderately severe depression, 20-27 = Severe depression   Psychosocial Evaluation and Intervention:  Psychosocial Evaluation - 02/02/23 1323       Psychosocial Evaluation & Interventions   Interventions Encouraged to exercise with the program and follow exercise prescription    Comments Jaicion 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 He reports that prior to returning to work this week, he was stressed about being on short term disability and the process of getting back into work. However, his boss and coworkers have been very supportive and working with him every step to make sure his transition back to full time is smooth. He is very grateful for his wife, family, and friends as they have been a big support. He has been working on losing weight and is proud of his progress. He wants to continue working on his weight and risk factor management.    Expected Outcomes Short: attend cardiac rehab for education and exercise. Long: develop and maintain positive self care habits.    Continue Psychosocial Services  Follow up required by staff             Psychosocial Re-Evaluation:   Psychosocial Discharge (Final Psychosocial Re-Evaluation):   Vocational Rehabilitation: Provide  vocational rehab assistance to qualifying candidates.   Vocational Rehab Evaluation & Intervention:  Vocational Rehab - 02/02/23 1315       Initial Vocational Rehab Evaluation & Intervention   Assessment shows need for Vocational Rehabilitation No             Education: Education Goals: Education classes will be provided on a variety of topics geared toward better understanding of heart health and risk factor modification. Participant will state understanding/return demonstration of topics presented as noted by education test scores.  Learning Barriers/Preferences:  Learning Barriers/Preferences - 02/02/23 1323       Learning Barriers/Preferences   Learning Barriers None    Learning Preferences None             General Cardiac Education Topics:  AED/CPR: - Group verbal and written instruction with the use of models to demonstrate the basic use of the AED with the basic ABC's of resuscitation.   Anatomy and Cardiac Procedures: - Group verbal and visual presentation and models provide information about basic cardiac anatomy and function. Reviews the testing methods done to diagnose heart disease and the outcomes of the test results. Describes the treatment choices: Medical Management, Angioplasty, or Coronary Bypass Surgery for treating various heart conditions including Myocardial Infarction, Angina, Valve Disease, and Cardiac Arrhythmias.  Written material given at graduation. Flowsheet Row Cardiac Rehab from 12/11/2021 in [facility] Cardiac and Pulmonary Rehab  Education need identified 10/21/21  Date 11/27/21  Educator SB  Instruction Review Code 1- Verbalizes Understanding       Medication Safety: - Group verbal and visual instruction to review commonly prescribed medications for heart and lung disease. Reviews the medication, class of the drug, and side effects. Includes the steps to properly store meds and maintain the prescription regimen.  Written material given at  graduation.   Intimacy: - Group verbal instruction through game format to discuss how heart and lung disease can affect sexual intimacy. Written material given at graduation.Marland Kitchen  Know Your Numbers and Heart Failure: - Group verbal and visual instruction to discuss disease risk factors for cardiac and pulmonary disease and treatment options.  Reviews associated critical values for Overweight/Obesity, Hypertension, Cholesterol, and Diabetes.  Discusses basics of heart failure: signs/symptoms and treatments.  Introduces Heart Failure Zone chart for action plan for heart failure.  Written material given at graduation. Flowsheet Row Cardiac Rehab from 03/04/2023 in [facility] Cardiac and Pulmonary Rehab  Date 02/18/23  Educator SB  Instruction Review Code 1- Verbalizes Understanding       Infection Prevention: - Provides verbal and written material to individual with discussion of infection control including proper hand washing and proper equipment cleaning during exercise session. Flowsheet Row Cardiac Rehab from 03/04/2023 in [facility] Cardiac and Pulmonary Rehab  Date 02/10/23  Educator MB  Instruction Review Code 1- Verbalizes Understanding       Falls Prevention: - Provides verbal and written material to individual with discussion of falls prevention and safety. Flowsheet Row Cardiac Rehab from 03/04/2023 in [facility] Cardiac and Pulmonary Rehab  Date 02/10/23  Educator MB  Instruction Review Code 1- Verbalizes Understanding       Other: -Provides group and verbal instruction on various topics (see comments)   Knowledge Questionnaire Score:  Knowledge Questionnaire Score - 03/01/23 1003       Knowledge Questionnaire Score   Pre Score 22/26             Core Components/Risk Factors/Patient Goals at Admission:  Personal Goals and Risk Factors at Admission - 02/10/23 1012       Core Components/Risk Factors/Patient Goals on Admission    Weight Management Yes;Obesity;Weight Loss     Intervention Weight Management: Develop a combined nutrition and exercise program designed to reach desired caloric intake, while maintaining appropriate intake of nutrient and fiber, sodium and fats, and appropriate energy expenditure required for the weight goal.;Weight Management/Obesity: Establish reasonable short term and long term weight goals.;Obesity: Provide education and appropriate resources to help participant work on and attain dietary goals.;Weight Management: Provide education and appropriate resources to help participant work on and attain dietary goals.    Admit Weight 300 lb 3.2 oz (136.2 kg)    Goal Weight: Short Term 260 lb (117.9 kg)    Goal Weight: Long Term 210 lb (95.3 kg)    Expected Outcomes Short Term: Continue to assess and modify interventions until short term weight is achieved;Long Term: Adherence to nutrition and physical activity/exercise program aimed toward attainment of established weight goal;Weight Loss: Understanding of general recommendations for a balanced deficit meal plan, which promotes 1-2 lb weight loss per week and includes a negative energy balance of [phone] kcal/d;Understanding recommendations for meals to include 15-35% energy as protein, 25-35% energy from fat, 35-60% energy from carbohydrates, less than 200mg  of dietary cholesterol, 20-35 gm of total fiber daily;Understanding of distribution of calorie intake throughout the day with the consumption of 4-5 meals/snacks    Diabetes Yes    Intervention Provide education about signs/symptoms and action to take for hypo/hyperglycemia.;Provide education about proper nutrition, including hydration, and aerobic/resistive exercise prescription along with prescribed medications to achieve blood glucose in normal ranges: Fasting glucose 65-99 mg/dL    Expected Outcomes Short Term: Participant verbalizes understanding of the signs/symptoms and immediate care of hyper/hypoglycemia, proper foot care and importance of  medication, aerobic/resistive exercise and nutrition plan for blood glucose control.;Long Term: Attainment of HbA1C < 7%.    Heart Failure Yes    Intervention Provide a combined exercise  and nutrition program that is supplemented with education, support and counseling about heart failure. Directed toward relieving symptoms such as shortness of breath, decreased exercise tolerance, and extremity edema.    Expected Outcomes Short term: Attendance in program 2-3 days a week with increased exercise capacity. Reported lower sodium intake. Reported increased fruit and vegetable intake. Reports medication compliance.;Improve functional capacity of life;Short term: Daily weights obtained and reported for increase. Utilizing diuretic protocols set by physician.;Long term: Adoption of self-care skills and reduction of barriers for early signs and symptoms recognition and intervention leading to self-care maintenance.    Hypertension Yes    Intervention Provide education on lifestyle modifcations including regular physical activity/exercise, weight management, moderate sodium restriction and increased consumption of fresh fruit, vegetables, and low fat dairy, alcohol moderation, and smoking cessation.;Monitor prescription use compliance.    Expected Outcomes Short Term: Continued assessment and intervention until BP is < 140/84mm HG in hypertensive participants. < 130/44mm HG in hypertensive participants with diabetes, heart failure or chronic kidney disease.;Long Term: Maintenance of blood pressure at goal levels.    Lipids Yes    Intervention Provide education and support for participant on nutrition & aerobic/resistive exercise along with prescribed medications to achieve LDL 70mg , HDL >40mg .    Expected Outcomes Short Term: Participant states understanding of desired cholesterol values and is compliant with medications prescribed. Participant is following exercise prescription and nutrition guidelines.;Long Term:  Cholesterol controlled with medications as prescribed, with individualized exercise RX and with personalized nutrition plan. Value goals: LDL < 70mg , HDL > 40 mg.             Education:Diabetes - Individual verbal and written instruction to review signs/symptoms of diabetes, desired ranges of glucose level fasting, after meals and with exercise. Acknowledge that pre and post exercise glucose checks will be done for 3 sessions at entry of program. Flowsheet Row Cardiac Rehab from 03/04/2023 in [facility] Cardiac and Pulmonary Rehab  Date 02/10/23  Educator MB  Instruction Review Code 1- Verbalizes Understanding       Core Components/Risk Factors/Patient Goals Review:    Core Components/Risk Factors/Patient Goals at Discharge (Final Review):    ITP Comments:  ITP Comments     Row Name 02/10/23 1002 02/15/23 0809 02/17/23 1220 03/17/23 1310 04/07/23 1010   ITP Comments Completed and gym orientation. Initial ITP created and sent for review to Dr. Bethann Punches, Medical Director. First full day of exercise!  Patient was oriented to gym and equipment including functions, settings, policies, and procedures.  Patient's individual exercise prescription and treatment plan were reviewed.  All starting workloads were established based on the results of the 6 minute walk test done at initial orientation visit.  The plan for exercise progression was also introduced and progression will be customized based on patient's performance and goals. 30 Day review completed. Medical Director ITP review done, changes made as directed, and signed approval by Medical Director.    new to program 30 Day review completed. Medical Director ITP review done, changes made as directed, and signed approval by Medical Director. 30 Day review completed. Medical Director ITP review done, changes made as directed, and signed approval by Medical Director.            Comments:

## 2023-04-15 IMAGING — MR MR HEAD W/O CM
10 series · 48 of 48 positions shown · non-contrast
Comparison: No pertinent prior exams available for comparison.

CLINICAL DATA: Chronic daily headache. Additional history provided
by scanning technologist: Patient reports "head pain" at top of head
for several weeks.

EXAM:
MRI HEAD WITHOUT CONTRAST
TECHNIQUE: Multiplanar, multiecho pulse sequences of the brain and surrounding
structures were obtained without intravenous contrast.

[Series 2: DWI · axial · 3.0mm · 1.20mm/px · z∈[-72,+84]mm · 9 of 109 slices shown (1 of 4)]
[im 1/109]
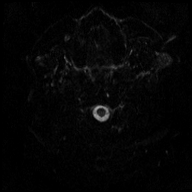
[im 14/109]
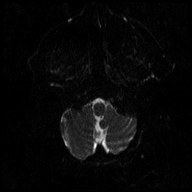
[im 28/109]
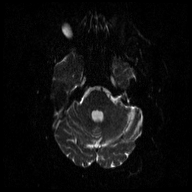
[im 41/109]
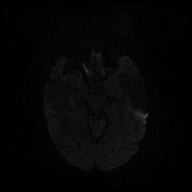
[im 55/109]
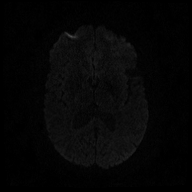
[im 68/109]
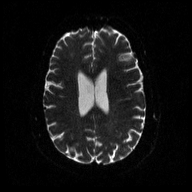
[im 82/109]
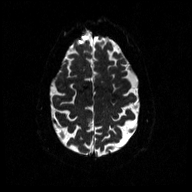
[im 95/109]
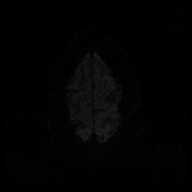
[im 109/109]
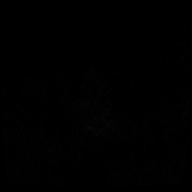

[Series 3: DWI · axial · 3.0mm · 1.20mm/px · z∈[-72,+84]mm · 4 of 55 slices shown (2 of 4)]
[im 1/55]
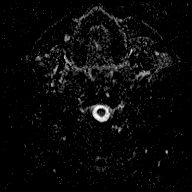
[im 19/55]
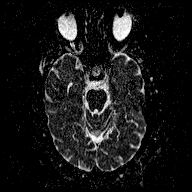
[im 37/55]
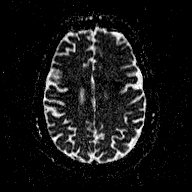
[im 55/55]
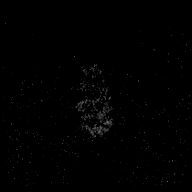

[Series 4: DWI · coronal · 3.0mm · 1.15mm/px · 7 of 92 slices shown (3 of 4)]
[im 1/92]
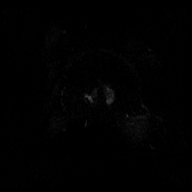
[im 16/92]
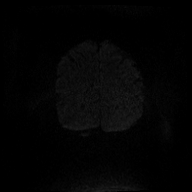
[im 31/92]
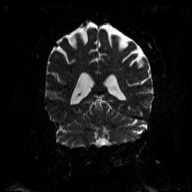
[im 46/92]
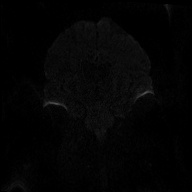
[im 61/92]
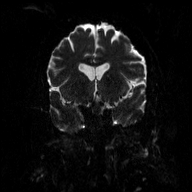
[im 76/92]
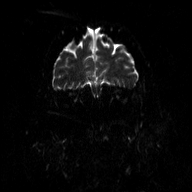
[im 92/92]
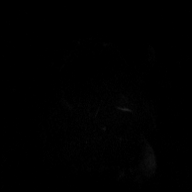

[Series 5: DWI · coronal · 3.0mm · 1.15mm/px · 4 of 47 slices shown (4 of 4)]
[im 1/47]
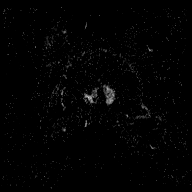
[im 16/47]
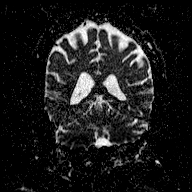
[im 31/47]
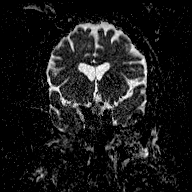
[im 47/47]
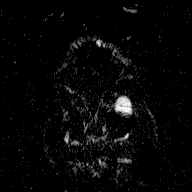

[Series 6: T1 · sagittal · 5.0mm · 0.45mm/px · 2 of 23 slices shown (1 of 2)]
[im 1/23]
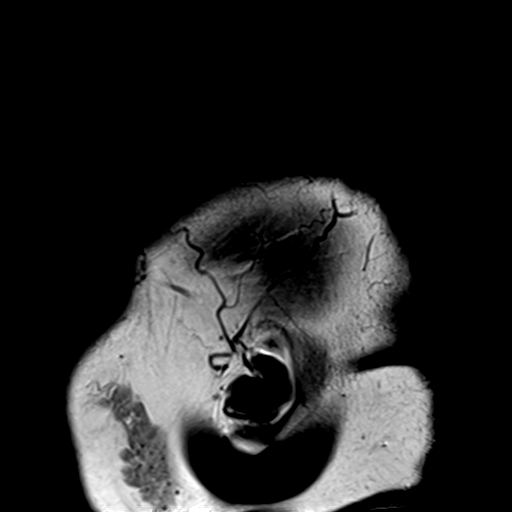
[im 23/23]
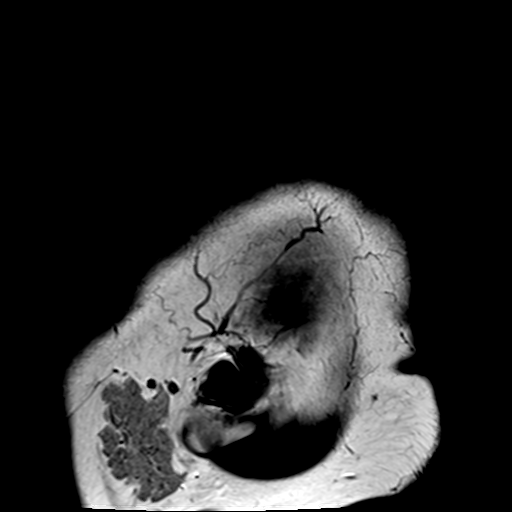

[Series 7: T2 · axial · 5.0mm · 0.72mm/px · z∈[-79,+83]mm · 2 of 25 slices shown (1 of 3)]
[im 1/25]
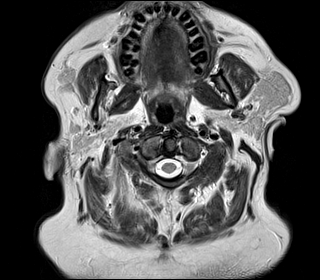
[im 25/25]
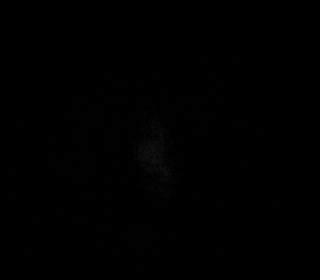

[Series 8: FLAIR · axial · 3.0mm · 0.45mm/px · z∈[-72,+83]mm · 4 of 55 slices shown]
[im 1/55]
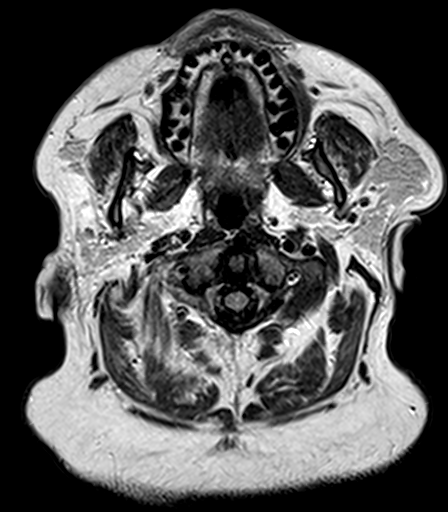
[im 19/55]
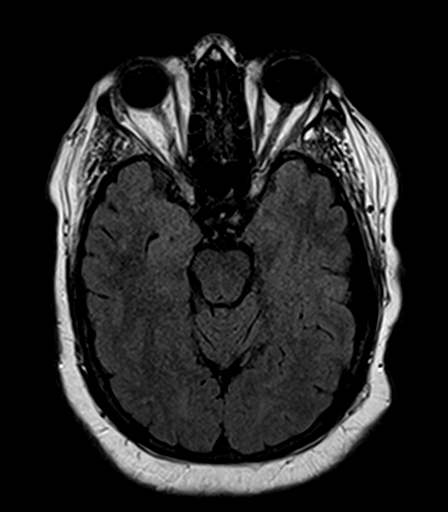
[im 37/55]
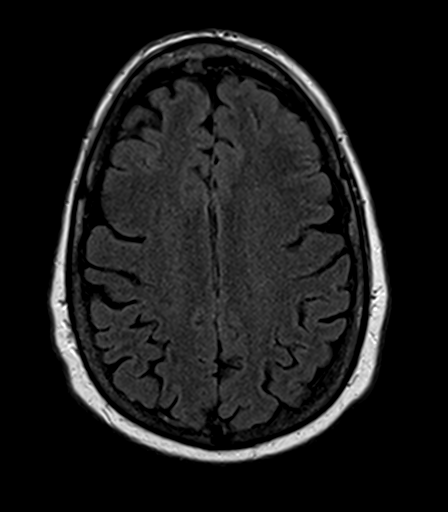
[im 55/55]
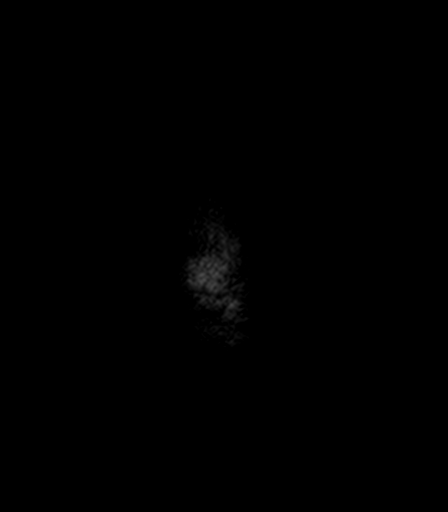

[Series 9: T2 · axial · 5.0mm · 0.72mm/px · z∈[-75,+86]mm · 2 of 25 slices shown (2 of 3)]
[im 1/25]
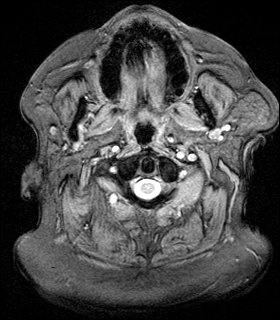
[im 25/25]
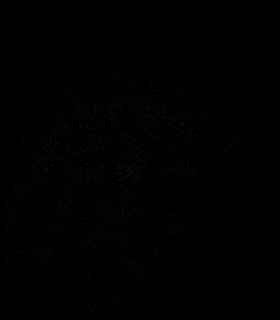

[Series 11: T2 · coronal · 5.0mm · 0.43mm/px · 2 of 29 slices shown (3 of 3)]
[im 1/29]
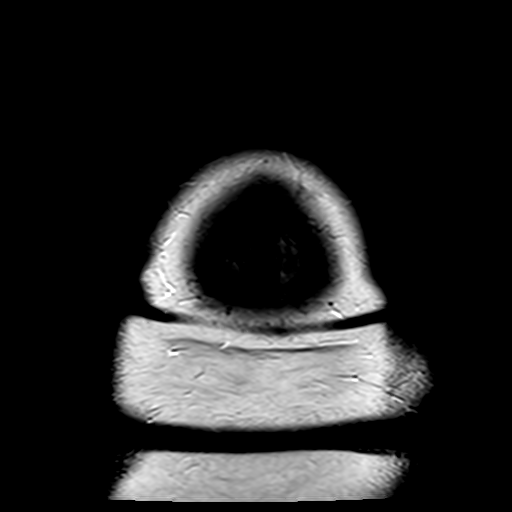
[im 29/29]
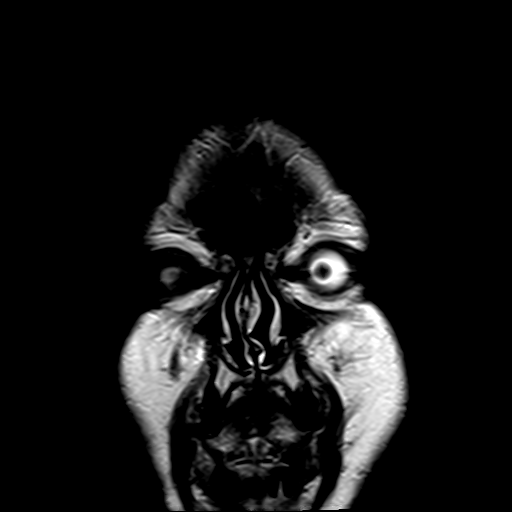

[Series 12: T1 · axial · 1.0mm · 0.94mm/px · z∈[-72,+81]mm · 12 of 160 slices shown (2 of 2)]
[im 1/160]
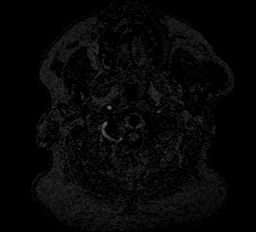
[im 15/160]
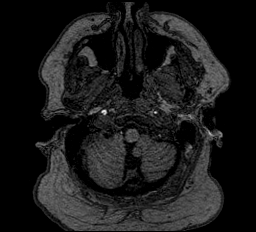
[im 29/160]
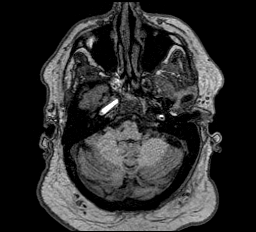
[im 44/160]
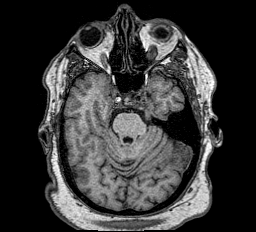
[im 58/160]
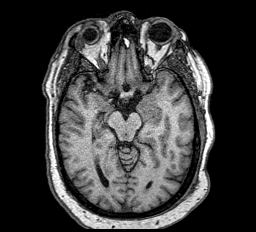
[im 73/160]
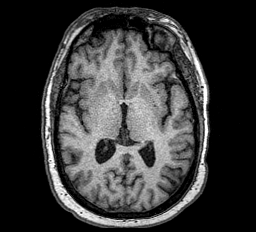
[im 87/160]
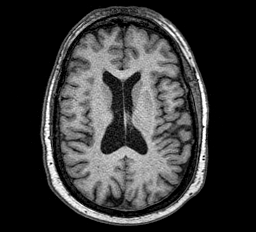
[im 102/160]
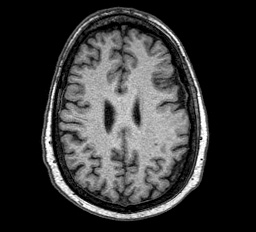
[im 116/160]
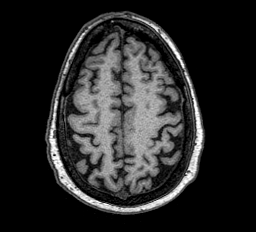
[im 131/160]
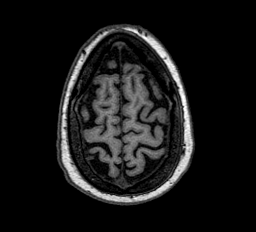
[im 145/160]
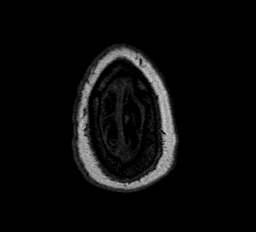
[im 160/160]
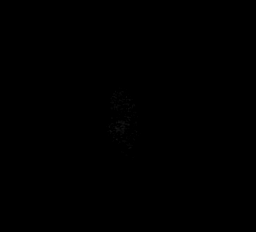

[48 of 48 positions shown; findings below may reference images not displayed]

FINDINGS: Brain:

Mild generalized cerebral and cerebellar atrophy.

No cortical encephalomalacia is identified. No significant cerebral
white matter disease for age.

There is no acute infarct.

No evidence of an intracranial mass.

No chronic intracranial blood products.

No extra-axial fluid collection.

No midline shift.

Vascular: Maintained flow voids within the proximal large arterial
vessels.

Skull and upper cervical spine: No focal suspicious marrow lesion.

Sinuses/Orbits: Visualized orbits show no acute finding. Small left
maxillary sinus mucous retention cyst.
IMPRESSION: No evidence of acute intracranial abnormality.

Mild generalized parenchymal atrophy.

Small left maxillary sinus mucous retention cyst.

## 2023-04-26 ENCOUNTER — Encounter: Payer: BLUE CROSS/BLUE SHIELD | Admitting: *Deleted

## 2023-05-05 ENCOUNTER — Encounter: Payer: Self-pay | Admitting: *Deleted

## 2023-05-05 DIAGNOSIS — Z952 Presence of prosthetic heart valve: Secondary | ICD-10-CM

## 2023-05-05 NOTE — Progress Notes (Signed)
Cardiac Individual Treatment Plan  Patient Details  Name: NICKOLIS YANIK MRN: [id number] Date of Birth: 03/12/1966 Referring Provider:   Flowsheet Row Cardiac Rehab from 02/10/2023 in [facility] Cardiac and Pulmonary Rehab  Referring Provider Lorine Bears, MD       Initial Encounter Date:  Flowsheet Row Cardiac Rehab from 02/10/2023 in [facility] Cardiac and Pulmonary Rehab  Date 02/10/23       Visit Diagnosis: S/P AVR (aortic valve replacement)  Patient's Home Medications on Admission:  Current Outpatient Medications:    acetaminophen (TYLENOL) 325 MG tablet, Take 650 mg by mouth every 6 (six) hours as needed for moderate pain., Disp: , Rfl:    albuterol (VENTOLIN HFA) 108 (90 Base) MCG/ACT inhaler, Inhale 1-2 puffs into the lungs every 6 (six) hours as needed for wheezing or shortness of breath., Disp: 18 g, Rfl: 1   aspirin EC 81 MG tablet, Take 1 tablet (81 mg total) by mouth daily., Disp: 90 tablet, Rfl: 0   atorvastatin (LIPITOR) 40 MG tablet, Take 1 tablet (40 mg total) by mouth daily at 6 PM., Disp: 30 tablet, Rfl: 2   cholecalciferol (CHOLECALCIFEROL) 25 MCG tablet, Take 1 tablet (1,000 Units total) by mouth daily., Disp: 30 tablet, Rfl: 0   cyanocobalamin (VITAMIN B12) 1000 MCG tablet, Take 1 tablet (1,000 mcg total) by mouth daily., Disp: , Rfl:    cyclobenzaprine (FLEXERIL) 10 MG tablet, Take 1 tablet (10 mg total) by mouth 3 (three) times daily as needed.For spasms (Patient taking differently: Take 30 mg by mouth daily as needed for muscle spasms. For spasms), Disp: 90 tablet, Rfl: 5   DULoxetine (CYMBALTA) 60 MG capsule, Take 120 mg by mouth at bedtime., Disp: , Rfl:    famotidine (PEPCID) 40 MG tablet, Take 40 mg by mouth at bedtime., Disp: , Rfl:    ferrous sulfate 325 (65 FE) MG EC tablet, Take 1 tablet (325 mg total) by mouth daily with breakfast., Disp: 30 tablet, Rfl: 0   fluticasone (FLONASE) 50 MCG/ACT nasal spray, Place 1 spray into both nostrils daily as needed for  allergies or rhinitis., Disp: , Rfl:    fluticasone-salmeterol (ADVAIR) 250-50 MCG/ACT AEPB, Inhale 1 puff into the lungs every 12 (twelve) hours., Disp: 60 each, Rfl: 6   furosemide (LASIX) 20 MG tablet, Take 1 tablet (20 mg total) by mouth daily as needed., Disp: 90 tablet, Rfl: 0   gabapentin (NEURONTIN) 300 MG capsule, Take 300 mg by mouth at bedtime., Disp: , Rfl:    glucose blood (GE100 BLOOD GLUCOSE TEST) test strip, Use as instructed, Disp: 100 each, Rfl: 12   ibuprofen (MOTRIN IB) 200 MG tablet, Take 2 tablets (400 mg total) by mouth 3 (three) times daily., Disp: 100 tablet, Rfl: 2   insulin glargine (LANTUS) 100 unit/mL SOPN, Inject 30 Units into the skin daily., Disp: 15 mL, Rfl: 11   JARDIANCE 25 MG TABS tablet, Take 25 mg by mouth daily., Disp: , Rfl:    Lancets (ONETOUCH ULTRASOFT) lancets, Check sugar twice daily, Disp: 100 each, Rfl: 12   lidocaine (LIDODERM) 5 %, Place 1 patch onto the skin daily. Remove & Discard patch within 12 hours or as directed by MD. May place on right or left side of chest (Patient taking differently: Place 1 patch onto the skin daily as needed (pain). Remove & Discard patch within 12 hours or as directed by MD. May place on right or left side of chest), Disp: 14 patch, Rfl: 0   metFORMIN (GLUCOPHAGE)  1000 MG tablet, Take 1 tablet (1,000 mg total) by mouth 2 (two) times daily with a meal., Disp: 180 tablet, Rfl: 1   metoprolol tartrate (LOPRESSOR) 25 MG tablet, Take 0.5 tablets (12.5 mg total) by mouth 2 (two) times daily., Disp: 90 tablet, Rfl: 1   traZODone (DESYREL) 50 MG tablet, Take 50 mg by mouth at bedtime., Disp: , Rfl:   Past Medical History: Past Medical History:  Diagnosis Date   Asthma    CAD (coronary artery disease)    a. 08/2017 Cath: Diff nonobs dzs, EF 45-50%, diff HK; b. 08/2021 MV: Ant/apical ishemia; c. 08/2021 PCI: LAD 95ost (4.0x15 Onyx Frontier DES), EF 55-65%; c. 09/2021 Cath: LM nl, LAD patent stent, 70m, 30d, RI min irregs, LCX min  irregs, RCA 50p. LVOT grad 50-84mmHg (rest), 70-160mmHg (provoked); d. 10/2022 Cath: LM nl, LAD 21m, 60d, RI min irregs, LCX min irregs, RCA 50p.   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 08/2017 LV gram: EF 45-50%, glob HK; b. 08/2021 Echo: EF 60-65%, no rwma, GrI DD, nl RV fxn; c. 09/2021 cMRI: EF 60%, no LVOT obstruction/gradient. Mild conc LVH, mild AS. No myocardial scar/fibrosis. No evidence of HCM.   Hyperlipidemia    Leg swelling    Left   Low back pain    Mixed ICM & NICM    a. 08/2017 LV gram: EF 45-50%, glob HK; b. 08/2021 Echo: EF 60-65%.   Morbid obesity (HCC)    OSA on CPAP    non compliant   Pulmonary hypertension (HCC)    Severe aortic stenosis    a. 10/2022 Cath: 31.68mmHg gradient and AVA 1.05cm^2; b. 10/2022 s/p 23mm Edwards Resilia bioprosthetic AoV, resection of sub-aortic membrane, and septal myectomy; c. 10/2022 Echo: EF 65-70%, no rwma, nl AoV structure/fxn.    Tobacco Use: Social History   Tobacco Use  Smoking Status Never  Smokeless Tobacco Never    Labs: Review Flowsheet  More data exists      Latest Ref Rng & Units 09/07/2022 09/08/2022 10/19/2022 10/28/2022 10/30/2022  Labs for ITP Cardiac and Pulmonary Rehab  Cholestrol 0 - 200 mg/dL - 295  - - -  LDL (calc) 0 - 99 mg/dL - 50  - - -  HDL-C >62 mg/dL - 31  - - -  Trlycerides <150 mg/dL - 130  - - -  Hemoglobin A1c 4.8 - 5.6 % 11.7  - - 10.0  -  PH, Arterial 7.35 - 7.45 - - 7.344  7.39  7.332  7.316  7.359  7.436  7.428   PCO2 arterial 32 - 48 mmHg - - 41.4  35  37.3  42.6  38.5  31.2  33.1   Bicarbonate 20.0 - 28.0 mmol/L - - 25.6  22.5  21.2  19.9  21.9  21.9  21.0  23.1  21.9   TCO2 22 - 32 mmol/L - - 27  24  - 21  23  23  22  22  26  26  24  23  23  26    Acid-base deficit 0.0 - 2.0  mmol/L - - 1.0  3.0  3.1  6.0  4.0  3.0  3.0  2.0  2.0   O2 Saturation % - - 61  93  100  98  99  99  99  67  100     Details       Multiple values from one day are sorted in reverse-chronological order          Exercise Target Goals: Exercise Program Goal: Individual exercise prescription set using results from initial 6 min walk test and THRR while considering  patient's activity barriers and safety.   Exercise Prescription Goal: Initial exercise prescription builds to 30-45 minutes a day of aerobic activity, 2-3 days per week.  Home exercise guidelines will be given to patient during program as part of exercise prescription that the participant will acknowledge.   Education: Aerobic Exercise: - Group verbal and visual presentation on the components of exercise prescription. Introduces F.I.T.T principle from ACSM for exercise prescriptions.  Reviews F.I.T.T. principles of aerobic exercise including progression. Written material given at graduation.   Education: Resistance Exercise: - Group verbal and visual presentation on the components of exercise prescription. Introduces F.I.T.T principle from ACSM for exercise prescriptions  Reviews F.I.T.T. principles of resistance exercise including progression. Written material given at graduation.    Education: Exercise & Equipment Safety: - Individual verbal instruction and demonstration of equipment use and safety with use of the equipment. Flowsheet Row Cardiac Rehab from 03/04/2023 in [facility] Cardiac and Pulmonary Rehab  Date 02/10/23  Educator MB  Instruction Review Code 1- Verbalizes Understanding       Education: Exercise Physiology & General Exercise Guidelines: - Group verbal and written instruction with models to review the exercise physiology of the cardiovascular system and associated critical values. Provides general exercise guidelines with specific guidelines to those with heart or lung disease.  Flowsheet Row Cardiac  Rehab from 03/04/2023 in [facility] Cardiac and Pulmonary Rehab  Date 03/04/23  Educator MB  Instruction Review Code 1- [employer] Understanding       Education: Flexibility, Balance, Mind/Body Relaxation: - Group verbal and visual presentation with interactive activity on the components of exercise prescription. Introduces F.I.T.T principle from ACSM for exercise prescriptions. Reviews F.I.T.T. principles of flexibility and balance exercise training including progression. Also discusses the mind body connection.  Reviews various relaxation techniques to help reduce and manage stress (i.e. Deep breathing, progressive muscle relaxation, and visualization). Balance handout provided to take home. Written material given at graduation.   Activity Barriers & Risk Stratification:  Activity Barriers & Cardiac Risk Stratification - 02/10/23 1004       Activity Barriers & Cardiac Risk Stratification   Activity Barriers Neck/Spine Problems;Shortness of Breath;Joint Problems;Back Problems    Cardiac Risk Stratification Moderate             6 Minute Walk:  6 Minute Walk     Row Name 02/10/23 1003         6 Minute Walk   Phase Initial     Distance 1050 feet     Walk Time 5.88 minutes     # of Rest Breaks 1     MPH 2.03     METS 2.73     RPE 14     Perceived Dyspnea  3     VO2 Peak 9.55     Symptoms Yes (comment)     Comments SOB     Resting HR 108 bpm     Resting BP 130/80     Resting Oxygen Saturation  100 %     Exercise Oxygen Saturation  during  6 min walk 100 %     Max Ex. HR 141 bpm     Max Ex. BP 142/90     2 Minute Post BP 132/90              Oxygen Initial Assessment:   Oxygen Re-Evaluation:   Oxygen Discharge (Final Oxygen Re-Evaluation):   Initial Exercise Prescription:  Initial Exercise Prescription - 02/10/23 1000       Date of Initial Exercise RX and Referring Provider   Date 02/10/23    Referring Provider Lorine Bears, MD      Oxygen   Maintain  Oxygen Saturation 88% or higher      Treadmill   MPH 2    Grade 0    Minutes 15    METs 2.53      Recumbant Bike   Level 2    RPM 50    Watts 31    Minutes 15    METs 2.73      REL-XR   Level 2    Speed 50    Minutes 15    METs 2.73      T5 Nustep   Level 2    SPM 80    Minutes 15    METs 2.73      Biostep-RELP   Level 2    SPM 50    Minutes 15    METs 2.73      Prescription Details   Frequency (times per week) 2    Duration Progress to 30 minutes of continuous aerobic without signs/symptoms of physical distress      Intensity   THRR 40-80% of Max Heartrate 130-152    Ratings of Perceived Exertion 11-13    Perceived Dyspnea 0-4      Progression   Progression Continue to progress workloads to maintain intensity without signs/symptoms of physical distress.      Resistance Training   Training Prescription Yes    Weight 5 lb    Reps 10-15             Perform Capillary Blood Glucose checks as needed.  Exercise Prescription Changes:   Exercise Prescription Changes     Row Name 02/10/23 1000 02/23/23 0800 03/10/23 1100 04/07/23 1400       Response to Exercise   Blood Pressure (Admit) 130/80 108/74 146/68 104/62    Blood Pressure (Exercise) 142/90 136/64 162/82 122/72    Blood Pressure (Exit) 132/90 138/86 124/80 102/56    Heart Rate (Admit) 108 bpm 99 bpm 104 bpm 102 bpm    Heart Rate (Exercise) 141 bpm 132 bpm 142 bpm 126 bpm    Heart Rate (Exit) 114 bpm 111 bpm 115 bpm 102 bpm    Oxygen Saturation (Admit) 100 % -- -- --    Oxygen Saturation (Exercise) 100 % -- -- --    Oxygen Saturation (Exit) 100 % -- -- --    Rating of Perceived Exertion (Exercise) 14 16 15 15     Perceived Dyspnea (Exercise) 3 0 -- 0    Symptoms SOB -- -- none    Comments results First 2 weeks of exercise -- --    Duration Progress to 30 minutes of  aerobic without signs/symptoms of physical distress Progress to 30 minutes of  aerobic without signs/symptoms of physical  distress Progress to 30 minutes of  aerobic without signs/symptoms of physical distress Progress to 30 minutes of  aerobic without signs/symptoms of physical distress    Intensity  THRR New THRR unchanged THRR unchanged THRR unchanged      Progression   Progression Continue to progress workloads to maintain intensity without signs/symptoms of physical distress. Continue to progress workloads to maintain intensity without signs/symptoms of physical distress. Continue to progress workloads to maintain intensity without signs/symptoms of physical distress. Continue to progress workloads to maintain intensity without signs/symptoms of physical distress.    Average METs 2.73 2.45 2.5 2.5      Resistance Training   Training Prescription -- Yes Yes Yes    Weight -- 5 lb 5 lb 5 lb    Reps -- 10-15 10-15 10-15      Interval Training   Interval Training -- No No No      Treadmill   MPH -- 2 1.9 --    Grade -- 0 0 --    Minutes -- 15 15 --    METs -- 2.53 2.45 --      Recumbant Bike   Level -- 3 3 3     Watts -- 26 26 26     Minutes -- 15 15 15     METs -- 2.61 2.6 2.61      REL-XR   Level -- 2 2 --    Minutes -- 15 15 --      T5 Nustep   Level -- 3 2 3     Minutes -- 15 15 15     METs -- 2.2 2.1 2.3      Biostep-RELP   Level -- -- 3 --    Minutes -- -- 15 --    METs -- -- 3 --      Oxygen   Maintain Oxygen Saturation -- 88% or higher 88% or higher 88% or higher             Exercise Comments:   Exercise Comments     Row Name 02/15/23 0809           Exercise Comments First full day of exercise!  Patient was oriented to gym and equipment including functions, settings, policies, and procedures.  Patient's individual exercise prescription and treatment plan were reviewed.  All starting workloads were established based on the results of the 6 minute walk test done at initial orientation visit.  The plan for exercise progression was also introduced and progression will be  customized based on patient's performance and goals.                Exercise Goals and Review:   Exercise Goals     Row Name 02/10/23 1009             Exercise Goals   Increase Physical Activity Yes       Intervention Provide advice, education, support and counseling about physical activity/exercise needs.;Develop an individualized exercise prescription for aerobic and resistive training based on initial evaluation findings, risk stratification, comorbidities and participant's personal goals.       Expected Outcomes Long Term: Add in home exercise to make exercise part of routine and to increase amount of physical activity.;Short Term: Attend rehab on a regular basis to increase amount of physical activity.;Long Term: Exercising regularly at least 3-5 days a week.       Increase Strength and Stamina Yes       Intervention Provide advice, education, support and counseling about physical activity/exercise needs.;Develop an individualized exercise prescription for aerobic and resistive training based on initial evaluation findings, risk stratification, comorbidities and participant's personal goals.  Expected Outcomes Short Term: Increase workloads from initial exercise prescription for resistance, speed, and METs.;Short Term: Perform resistance training exercises routinely during rehab and add in resistance training at home;Long Term: Improve cardiorespiratory fitness, muscular endurance and strength as measured by increased METs and functional capacity ( )       Able to understand and use rate of perceived exertion (RPE) scale Yes       Intervention Provide education and explanation on how to use RPE scale       Expected Outcomes Short Term: Able to use RPE daily in rehab to express subjective intensity level;Long Term:  Able to use RPE to guide intensity level when exercising independently       Able to understand and use Dyspnea scale Yes       Intervention Provide education and  explanation on how to use Dyspnea scale       Expected Outcomes Short Term: Able to use Dyspnea scale daily in rehab to express subjective sense of shortness of breath during exertion;Long Term: Able to use Dyspnea scale to guide intensity level when exercising independently       Knowledge and understanding of Target Heart Rate Range (THRR) Yes       Intervention Provide education and explanation of THRR including how the numbers were predicted and where they are located for reference       Expected Outcomes Short Term: Able to state/look up THRR;Short Term: Able to use daily as guideline for intensity in rehab;Long Term: Able to use THRR to govern intensity when exercising independently       Able to check pulse independently Yes       Intervention Provide education and demonstration on how to check pulse in carotid and radial arteries.;Review the importance of being able to check your own pulse for safety during independent exercise       Expected Outcomes Short Term: Able to explain why pulse checking is important during independent exercise;Long Term: Able to check pulse independently and accurately       Understanding of Exercise Prescription Yes       Intervention Provide education, explanation, and written materials on patient's individual exercise prescription       Expected Outcomes Short Term: Able to explain program exercise prescription;Long Term: Able to explain home exercise prescription to exercise independently                Exercise Goals Re-Evaluation :  Exercise Goals Re-Evaluation     Row Name 02/15/23 0809 02/23/23 0819 03/10/23 1148 03/24/23 1521 04/07/23 1435     Exercise Goal Re-Evaluation   Exercise Goals Review Able to understand and use rate of perceived exertion (RPE) scale;Able to understand and use Dyspnea scale;Knowledge and understanding of Target Heart Rate Range (THRR);Understanding of Exercise Prescription Increase Physical Activity;Increase Strength and  Stamina;Understanding of Exercise Prescription Increase Physical Activity;Increase Strength and Stamina;Understanding of Exercise Prescription Increase Physical Activity;Increase Strength and Stamina;Understanding of Exercise Prescription Increase Physical Activity;Increase Strength and Stamina;Understanding of Exercise Prescription   Comments Reviewed RPE and dyspnea scale, THR and program prescription with pt today.  Pt voiced understanding and was given a copy of goals to take home. Buddie continues to do well in rehab. He continues to get aquainted to the exercise machines that are prescribed. He was able to use the recumbent bike and T5 nustep at level 3. He was also able to use the treadmill at an intensity of 2 mph with no incline. We will continue to  monitor his progress in the program. Qamar is doing well in rehab. His treadmill workload has stayed consistent at 1.9-2.0 mph with no incline. He also has stayed consistent with his workloads on the T5 nustep, recumbent bike, and XR. He did improve to level 3 on the biostep. We will continue to monitor his progress in the program. Kupono has not attended rehab during this review. We will continue to reach out to make sure he is well. We will continue to monitor his progress in the program. Terick was able to attend one rehab session during this review. During the session he was able to increase his level on the T5 nustep from level 2 ot level 3. He was also able to maintain his intensity on the recumbent bike. We will continue to monitor his progress in the program.   Expected Outcomes Short: Use RPE daily to regulate intensity. Long: Follow program prescription in THR. Short: Continue to follow current exercise prescription. Long: Continue exercise to improve strength and stamina. Short: Begin to progressively increase treadmill workload. Long: Continue exercise to improve strength and stamina. Short: Return to rehab. Long: Continue to exercise to improve  strength and stamina. Short: Continue to follow current exercise prescription. Long: Continue exercise to improve strength and stamina.    Row Name 04/19/23 1023             Exercise Goal Re-Evaluation   Exercise Goals Review Increase Physical Activity;Increase Strength and Stamina;Understanding of Exercise Prescription       Comments Ante has not attended rehab since 11/11. He has been in contact with [place] and stated that he will be back next week. We will continue to monitor his progress in the program.       Expected Outcomes Short: Continue to follow current exercise prescription. Long: Continue exercise to improve strength and stamina.                Discharge Exercise Prescription (Final Exercise Prescription Changes):  Exercise Prescription Changes - 04/07/23 1400       Response to Exercise   Blood Pressure (Admit) 104/62    Blood Pressure (Exercise) 122/72    Blood Pressure (Exit) 102/56    Heart Rate (Admit) 102 bpm    Heart Rate (Exercise) 126 bpm    Heart Rate (Exit) 102 bpm    Rating of Perceived Exertion (Exercise) 15    Perceived Dyspnea (Exercise) 0    Symptoms none    Duration Progress to 30 minutes of  aerobic without signs/symptoms of physical distress    Intensity THRR unchanged      Progression   Progression Continue to progress workloads to maintain intensity without signs/symptoms of physical distress.    Average METs 2.5      Resistance Training   Training Prescription Yes    Weight 5 lb    Reps 10-15      Interval Training   Interval Training No      Recumbant Bike   Level 3    Watts 26    Minutes 15    METs 2.61      T5 Nustep   Level 3    Minutes 15    METs 2.3      Oxygen   Maintain Oxygen Saturation 88% or higher             Nutrition:  Target Goals: Understanding of nutrition guidelines, daily intake of sodium 1500mg , cholesterol 200mg , calories 30% from fat and 7% or less  from saturated fats, daily to have 5 or more  servings of fruits and vegetables.  Education: All About Nutrition: -Group instruction provided by verbal, written material, interactive activities, discussions, models, and posters to present general guidelines for heart healthy nutrition including fat, fiber, MyPlate, the role of sodium in heart healthy nutrition, utilization of the nutrition label, and utilization of this knowledge for meal planning. Follow up email sent as well. Written material given at graduation. Flowsheet Row Cardiac Rehab from 12/11/2021 in [facility] Cardiac and Pulmonary Rehab  Education need identified 10/21/21  Date 12/11/21  Educator MC  Instruction Review Code 1- Verbalizes Understanding       Biometrics:  Pre Biometrics - 02/10/23 1010       Pre Biometrics   Height 5' 9.7" (1.77 m)    Weight 300 lb 3.2 oz (136.2 kg)    Waist Circumference 54.5 inches    Hip Circumference 55 inches    Waist to Hip Ratio 0.99 %    BMI (Calculated) 43.46    Single Leg Stand 5 seconds              Nutrition Therapy Plan and Nutrition Goals:  Nutrition Therapy & Goals - 02/15/23 0946       Nutrition Therapy   Diet Carb controlled, Cardiac, Low Na    Protein (specify units) 90    Fiber 30 grams    Whole Grain Foods 3 servings    Saturated Fats 15 max. grams    Fruits and Vegetables 5 servings/day    Sodium 2 grams      Personal Nutrition Goals   Nutrition Goal Write down on paper and low 1-2 days on app per week    Personal Goal #2 Watch carb sources, aim for ~30-60g per meal    Personal Goal #3 Drink 3 bottles per day, cut back on diet soda    Comments Patient drinking ~32oz of water, drinks diet soda as well. He reports occasionally drinking juice. His A1C is 10.0% as of 10/28/22. Spoke with him about this, he is somewhat knowledgeable about carbs. He knew about simple sugars vs complex carbs, carb sources, and the goal of ~45g per meal. Asked if he counted carbs, he reported he didn't, says he would choose meals  that were easiest and that both he and his wife likes. Encouraged him to be mindful of carb portions provided some examples of appropriate portions of carbs at his meals. Built out several meals and snacks with foods he likes and will eat, focusing on consistent carb portions (~30-60g each meal) with high quality carbs paired with lean protein. He likes veggies, encouraged him to eat them in large portions to help control carb portions on his plates. Provided and reviewed several Mediterranean diet handouts to promote more heart healthy eating. Educated on types of fats, sodium, and whole grains. He is optimistic he can make some of these changes. Will continue to monitor and encouraged him to meet with RD again in a few weeks      Intervention Plan   Intervention Prescribe, educate and counsel regarding individualized specific dietary modifications aiming towards targeted core components such as weight, hypertension, lipid management, diabetes, heart failure and other comorbidities.;Nutrition handout(s) given to patient.    Expected Outcomes Short Term Goal: Understand basic principles of dietary content, such as calories, fat, sodium, cholesterol and nutrients.;Short Term Goal: A plan has been developed with personal nutrition goals set during dietitian appointment.;Long Term Goal: Adherence to prescribed nutrition  plan.             Nutrition Assessments:  MEDIFICTS Score Key: >=70 Need to make dietary changes  40-70 Heart Healthy Diet <= 40 Therapeutic Level Cholesterol Diet  Flowsheet Row Cardiac Rehab from 03/01/2023 in [facility] Cardiac and Pulmonary Rehab  Picture Your Plate Total Score on Admission 45      Picture Your Plate Scores: <65 Unhealthy dietary pattern with much room for improvement. 41-50 Dietary pattern unlikely to meet recommendations for good health and room for improvement. 51-60 More healthful dietary pattern, with some room for improvement.  >60 Healthy dietary  pattern, although there may be some specific behaviors that could be improved.    Nutrition Goals Re-Evaluation:   Nutrition Goals Discharge (Final Nutrition Goals Re-Evaluation):   Psychosocial: Target Goals: Acknowledge presence or absence of significant depression and/or stress, maximize coping skills, provide positive support system. Participant is able to verbalize types and ability to use techniques and skills needed for reducing stress and depression.   Education: Stress, Anxiety, and Depression - Group verbal and visual presentation to define topics covered.  Reviews how body is impacted by stress, anxiety, and depression.  Also discusses healthy ways to reduce stress and to treat/manage anxiety and depression.  Written material given at graduation. Flowsheet Row Cardiac Rehab from 03/04/2023 in [facility] Cardiac and Pulmonary Rehab  Date 02/25/23  Educator SB  Instruction Review Code 1- [employer] Understanding       Education: Sleep Hygiene -Provides group verbal and written instruction about how sleep can affect your health.  Define sleep hygiene, discuss sleep cycles and impact of sleep habits. Review good sleep hygiene tips.    Initial Review & Psychosocial Screening:  Initial Psych Review & Screening - 02/02/23 1317       Initial Review   Current issues with None Identified      Family Dynamics   Good Support System? Yes   wife, family, friends, church family     Barriers   Psychosocial barriers to participate in program There are no identifiable barriers or psychosocial needs.      Screening Interventions   Interventions Encouraged to exercise;To provide support and resources with identified psychosocial needs;Provide feedback about the scores to participant    Expected Outcomes Short Term goal: Utilizing psychosocial counselor, staff and physician to assist with identification of specific Stressors or current issues interfering with healing process. Setting desired  goal for each stressor or current issue identified.;Long Term Goal: Stressors or current issues are controlled or eliminated.;Short Term goal: Identification and review with participant of any Quality of Life or Depression concerns found by scoring the questionnaire.;Long Term goal: The participant improves quality of Life and PHQ9 Scores as seen by post scores and/or verbalization of changes             Quality of Life Scores:   Quality of Life - 03/01/23 1005       Quality of Life   Select Quality of Life      Quality of Life Scores   Health/Function Pre 14.13 %    Socioeconomic Pre 19.71 %    Psych/Spiritual Pre 18 %    Family Pre 19.5 %    GLOBAL Pre 16.79 %            Scores of 19 and below usually indicate a poorer quality of life in these areas.  A difference of  2-3 points is a clinically meaningful difference.  A difference of 2-3 points in  the total score of the Quality of Life Index has been associated with significant improvement in overall quality of life, self-image, physical symptoms, and general health in studies assessing change in quality of life.  PHQ-9: Review Flowsheet  More data exists      02/10/2023 12/29/2021 12/08/2021 11/13/2021 10/21/2021  Depression screen PHQ 2/9  Decreased Interest 1 0 0 1 2  Down, Depressed, Hopeless 0 0 1 0 0  PHQ - 2 Score 1 0 1 1 2   Altered sleeping 2 1 2 2 2   Tired, decreased energy 3 2 2 2 3   Change in appetite 0 0 0 1 2  Feeling bad or failure about yourself  0 0 0 0 0  Trouble concentrating 0 0 1 0 0  Moving slowly or fidgety/restless 1 0 1 0 1  Suicidal thoughts 0 0 0 0 0  PHQ-9 Score 7 3 7 6 10   Difficult doing work/chores Somewhat difficult Somewhat difficult Somewhat difficult Somewhat difficult Somewhat difficult   Interpretation of Total Score  Total Score Depression Severity:  1-4 = Minimal depression, 5-9 = Mild depression, 10-14 = Moderate depression, 15-19 = Moderately severe depression, 20-27 = Severe  depression   Psychosocial Evaluation and Intervention:  Psychosocial Evaluation - 02/02/23 1323       Psychosocial Evaluation & Interventions   Interventions Encouraged to exercise with the program and follow exercise prescription    Comments 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 He reports that prior to returning to work this week, he was stressed about being on short term disability and the process of getting back into work. However, his boss and coworkers have been very supportive and working with him every step to make sure his transition back to full time is smooth. He is very grateful for his wife, family, and friends as they have been a big support. He has been working on losing weight and is proud of his progress. He wants to continue working on his weight and risk factor management.    Expected Outcomes Short: attend cardiac rehab for education and exercise. Long: develop and maintain positive self care habits.    Continue Psychosocial Services  Follow up required by staff             Psychosocial Re-Evaluation:   Psychosocial Discharge (Final Psychosocial Re-Evaluation):   Vocational Rehabilitation: Provide vocational rehab assistance to qualifying candidates.   Vocational Rehab Evaluation & Intervention:  Vocational Rehab - 02/02/23 1315       Initial Vocational Rehab Evaluation & Intervention   Assessment shows need for Vocational Rehabilitation No             Education: Education Goals: Education classes will be provided on a variety of topics geared toward better understanding of heart health and risk factor modification. Participant will state understanding/return demonstration of topics presented as noted by education test scores.  Learning Barriers/Preferences:  Learning Barriers/Preferences - 02/02/23 1323       Learning Barriers/Preferences   Learning Barriers None    Learning Preferences None             General  Cardiac Education Topics:  AED/CPR: - Group verbal and written instruction with the use of models to demonstrate the basic use of the AED with the basic ABC's of resuscitation.   Anatomy and Cardiac Procedures: - Group verbal and visual presentation and models provide information about basic cardiac anatomy and function. Reviews the testing methods done to diagnose heart  disease and the outcomes of the test results. Describes the treatment choices: Medical Management, Angioplasty, or Coronary Bypass Surgery for treating various heart conditions including Myocardial Infarction, Angina, Valve Disease, and Cardiac Arrhythmias.  Written material given at graduation. Flowsheet Row Cardiac Rehab from 12/11/2021 in [facility] Cardiac and Pulmonary Rehab  Education need identified 10/21/21  Date 11/27/21  Educator SB  Instruction Review Code 1- Verbalizes Understanding       Medication Safety: - Group verbal and visual instruction to review commonly prescribed medications for heart and lung disease. Reviews the medication, class of the drug, and side effects. Includes the steps to properly store meds and maintain the prescription regimen.  Written material given at graduation.   Intimacy: - Group verbal instruction through game format to discuss how heart and lung disease can affect sexual intimacy. Written material given at graduation..   Know Your Numbers and Heart Failure: - Group verbal and visual instruction to discuss disease risk factors for cardiac and pulmonary disease and treatment options.  Reviews associated critical values for Overweight/Obesity, Hypertension, Cholesterol, and Diabetes.  Discusses basics of heart failure: signs/symptoms and treatments.  Introduces Heart Failure Zone chart for action plan for heart failure.  Written material given at graduation. Flowsheet Row Cardiac Rehab from 03/04/2023 in [facility] Cardiac [facility] Pul[facility] Rehab  Date 02/18/23  Educator SB  Instruction Review  Code 1- Verbalizes Understanding       Infection Prevention: - Provides verbal and written material to individual with discussion of infection control including proper hand washing and proper equipment cleaning during exercise session. Flowsheet Row Cardiac Rehab from 03/04/2023 in [facility] Cardiac and Pulmonary Rehab  Date 02/10/23  Educator MB  Instruction Review Code 1- Verbalizes Understanding       Falls Prevention: - Provides verbal and written material to individual with discussion of falls prevention and safety. Flowsheet Row Cardiac Rehab from 03/04/2023 in [facility] Cardiac and Pulmonary Rehab  Date 02/10/23  Educator MB  Instruction Review Code 1- Verbalizes Understanding       Other: -Provides group and verbal instruction on various topics (see comments)   Knowledge Questionnaire Score:  Knowledge Questionnaire Score - 03/01/23 1003       Knowledge Questionnaire Score   Pre Score 22/26             Core Components/Risk Factors/Patient Goals at Admission:  Personal Goals and Risk Factors at Admission - 02/10/23 1012       Core Components/Risk Factors/Patient Goals on Admission    Weight Management Yes;Obesity;Weight Loss    Intervention Weight Management: Develop a combined nutrition and exercise program designed to reach desired caloric intake, while maintaining appropriate intake of nutrient and fiber, sodium and fats, and appropriate energy expenditure required for the weight goal.;Weight Management/Obesity: Establish reasonable short term and long term weight goals.;Obesity: Provide education and appropriate resources to help participant work on and attain dietary goals.;Weight Management: Provide education and appropriate resources to help participant work on and attain dietary goals.    Admit Weight 300 lb 3.2 oz (136.2 kg)    Goal Weight: Short Term 260 lb (117.9 kg)    Goal Weight: Long Term 210 lb (95.3 kg)    Expected Outcomes Short Term: Continue to  assess and modify interventions until short term weight is achieved;Long Term: Adherence to nutrition and physical activity/exercise program aimed toward attainment of established weight goal;Weight Loss: Understanding of general recommendations for a balanced deficit meal plan, which promotes 1-2 lb weight loss per week and  includes a negative energy balance of [id number] kcal/d;Understanding recommendations for meals to include 15-35% energy as protein, 25-35% energy from fat, 35-60% energy from carbohydrates, less than 200mg  of dietary cholesterol, 20-35 gm of total fiber daily;Understanding of distribution of calorie intake throughout the day with the consumption of 4-5 meals/snacks    Diabetes Yes    Intervention Provide education about signs/symptoms and action to take for hypo/hyperglycemia.;Provide education about proper nutrition, including hydration, and aerobic/resistive exercise prescription along with prescribed medications to achieve blood glucose in normal ranges: Fasting glucose 65-99 mg/dL    Expected Outcomes Short Term: Participant verbalizes understanding of the signs/symptoms and immediate care of hyper/hypoglycemia, proper foot care and importance of medication, aerobic/resistive exercise and nutrition plan for blood glucose control.;Long Term: Attainment of HbA1C < 7%.    Heart Failure Yes    Intervention Provide a combined exercise and nutrition program that is supplemented with education, support and counseling about heart failure. Directed toward relieving symptoms such as shortness of breath, decreased exercise tolerance, and extremity edema.    Expected Outcomes Short term: Attendance in program 2-3 days a week with increased exercise capacity. Reported lower sodium intake. Reported increased fruit and vegetable intake. Reports medication compliance.;Improve functional capacity of life;Short term: Daily weights obtained and reported for increase. Utilizing diuretic protocols set by  physician.;Long term: Adoption of self-care skills and reduction of barriers for early signs and symptoms recognition and intervention leading to self-care maintenance.    Hypertension Yes    Intervention Provide education on lifestyle modifcations including regular physical activity/exercise, weight management, moderate sodium restriction and increased consumption of fresh fruit, vegetables, and low fat dairy, alcohol moderation, and smoking cessation.;Monitor prescription use compliance.    Expected Outcomes Short Term: Continued assessment and intervention until BP is < 140/48mm HG in hypertensive participants. < 130/15mm HG in hypertensive participants with diabetes, heart failure or chronic kidney disease.;Long Term: Maintenance of blood pressure at goal levels.    Lipids Yes    Intervention Provide education and support for participant on nutrition & aerobic/resistive exercise along with prescribed medications to achieve LDL 70mg , HDL >40mg .    Expected Outcomes Short Term: Participant states understanding of desired cholesterol values and is compliant with medications prescribed. Participant is following exercise prescription and nutrition guidelines.;Long Term: Cholesterol controlled with medications as prescribed, with individualized exercise RX and with personalized nutrition plan. Value goals: LDL < 70mg , HDL > 40 mg.             Education:Diabetes - Individual verbal and written instruction to review signs/symptoms of diabetes, desired ranges of glucose level fasting, after meals and with exercise. Acknowledge that pre and post exercise glucose checks will be done for 3 sessions at entry of program. Flowsheet Row Cardiac Rehab from 03/04/2023 in [facility] Cardiac and Pulmonary Rehab  Date 02/10/23  Educator MB  Instruction Review Code 1- Verbalizes Understanding       Core Components/Risk Factors/Patient Goals Review:    Core Components/Risk Factors/Patient Goals at Discharge  (Final Review):    ITP Comments:  ITP Comments     Row Name 02/10/23 1002 02/15/23 0809 02/17/23 1220 03/17/23 1310 04/07/23 1010   ITP Comments Completed and gym orientation. Initial ITP created and sent for review to Dr. Bethann Punches, Medical Director. First full day of exercise!  Patient was oriented to gym and equipment including functions, settings, policies, and procedures.  Patient's individual exercise prescription and treatment plan were reviewed.  All starting workloads were established based on  the results of the 6 minute walk test done at initial orientation visit.  The plan for exercise progression was also introduced and progression will be customized based on patient's performance and goals. 30 Day review completed. Medical Director ITP review done, changes made as directed, and signed approval by Medical Director.    new to program 30 Day review completed. Medical Director ITP review done, changes made as directed, and signed approval by Medical Director. 30 Day review completed. Medical Director ITP review done, changes made as directed, and signed approval by Medical Director.    Row Name 05/05/23 1118           ITP Comments 30 Day review completed. Medical Director ITP review done, changes made as directed, and signed approval by Medical Director.    last visit 11/11                Comments:

## 2023-05-06 ENCOUNTER — Encounter: Payer: BLUE CROSS/BLUE SHIELD | Admitting: *Deleted

## 2023-05-06 ENCOUNTER — Telehealth: Payer: Self-pay

## 2023-05-06 DIAGNOSIS — Z952 Presence of prosthetic heart valve: Secondary | ICD-10-CM

## 2023-05-06 DIAGNOSIS — Z955 Presence of coronary angioplasty implant and graft: Secondary | ICD-10-CM

## 2023-05-06 NOTE — Telephone Encounter (Signed)
Called patient to inform him that he has been out since 03/29/2023. Patient has had 11 sessions since 02/15/23. Informed patient of attendance and when his availability opens up to obtain a new referral. Patient verbalizes understanding. Patient discharged.

## 2023-05-06 NOTE — Progress Notes (Signed)
Discharge Progress Report  Patient Details  Name: Brett Marshall MRN: [id number] Date of Birth: Mar 05, 1966 Referring Provider:   Flowsheet Row Cardiac Rehab from 02/10/2023 in [facility]  Referring Provider Lorine Bears, MD        Number of Visits: 11/36  Reason for Discharge:  Early Exit:  Personal and Lack of attendance  Smoking History:  Social History   Tobacco Use  Smoking Status Never  Smokeless Tobacco Never    Diagnosis:  S/P AVR (aortic valve replacement)  Status post coronary artery stent placement  ADL [facility]:   Initial Exercise Prescription:  Initial Exercise Prescription - 02/10/23 1000       Date of Initial Exercise RX and Referring Provider   Date 02/10/23    Referring Provider Lorine Bears, MD      Oxygen   Maintain Oxygen Saturation 88% or higher      Treadmill   MPH 2    Grade 0    Minutes 15    METs 2.53      Recumbant Bike   Level 2    RPM 50    Watts 31    Minutes 15    METs 2.73      REL-XR   Level 2    Speed 50    Minutes 15    METs 2.73      T5 Nustep   Level 2    SPM 80    Minutes 15    METs 2.73      Biostep-RELP   Level 2    SPM 50    Minutes 15    METs 2.73      Prescription Details   Frequency (times per week) 2    Duration Progress to 30 minutes of continuous aerobic without signs/symptoms of physical distress      Intensity   THRR 40-80% of Max Heartrate 130-152    Ratings of Perceived Exertion 11-13    Perceived Dyspnea 0-4      Progression   Progression Continue to progress workloads to maintain intensity without signs/symptoms of physical distress.      Resistance Training   Training Prescription Yes    Weight 5 lb    Reps 10-15             Discharge Exercise Prescription (Final Exercise Prescription Changes):  Exercise Prescription Changes - 04/07/23 1400       Response to Exercise   Blood Pressure (Admit) 104/62    Blood Pressure (Exercise) 122/72     Blood Pressure (Exit) 102/56    Heart Rate (Admit) 102 bpm    Heart Rate (Exercise) 126 bpm    Heart Rate (Exit) 102 bpm    Rating of Perceived Exertion (Exercise) 15    Perceived Dyspnea (Exercise) 0    Symptoms none    Duration Progress to 30 minutes of  aerobic without signs/symptoms of physical distress    Intensity THRR unchanged      Progression   Progression Continue to progress workloads to maintain intensity without signs/symptoms of physical distress.    Average METs 2.5      Resistance Training   Training Prescription Yes    Weight 5 lb    Reps 10-15      Interval Training   Interval Training No      Recumbant Bike   Level 3    Watts 26    Minutes 15    METs 2.61  T5 Nustep   Level 3    Minutes 15    METs 2.3      Oxygen   Maintain Oxygen Saturation 88% or higher             Functional Capacity:  6 Minute Walk     Row Name 02/10/23 1003         6 Minute Walk   Phase Initial     Distance 1050 feet     Walk Time 5.88 minutes     # of Rest Breaks 1     MPH 2.03     METS 2.73     RPE 14     Perceived Dyspnea  3     VO2 Peak 9.55     Symptoms Yes (comment)     Comments SOB     Resting HR 108 bpm     Resting BP 130/80     Resting Oxygen Saturation  100 %     Exercise Oxygen Saturation  during 6 min walk 100 %     Max Ex. HR 141 bpm     Max Ex. BP 142/90     2 Minute Post BP 132/90              Psychological, QOL, Others - Outcomes: PHQ 2/9:    02/10/2023   10:12 AM 12/29/2021    7:28 AM 12/08/2021    7:42 AM 11/13/2021    7:35 AM 10/21/2021   10:34 AM  Depression screen PHQ 2/9  Decreased Interest 1 0 0 1 2  Down, Depressed, Hopeless 0 0 1 0 0  PHQ - 2 Score 1 0 1 1 2   Altered sleeping 2 1 2 2 2   Tired, decreased energy 3 2 2 2 3   Change in appetite 0 0 0 1 2  Feeling bad or failure about yourself  0 0 0 0 0  Trouble concentrating 0 0 1 0 0  Moving slowly or fidgety/restless 1 0 1 0 1  Suicidal thoughts 0 0 0 0 0  PHQ-9  Score 7 3 7 6 10   Difficult doing work/chores Somewhat difficult Somewhat difficult Somewhat difficult Somewhat difficult Somewhat difficult    Quality of Life:  Quality of Life - 03/01/23 1005       Quality of Life   Select Quality of Life      Quality of Life Scores   Health/Function Pre 14.13 %    Socioeconomic Pre 19.71 %    Psych/Spiritual Pre 18 %    Family Pre 19.5 %    GLOBAL Pre 16.79 %             Personal Goals: Goals established at orientation with interventions provided to work toward goal.  Personal Goals and Risk Factors at Admission - 02/10/23 1012       Core Components/Risk Factors/Patient Goals on Admission    Weight Management Yes;Obesity;Weight Loss    Intervention Weight Management: Develop a combined nutrition and exercise program designed to reach desired caloric intake, while maintaining appropriate intake of nutrient and fiber, sodium and fats, and appropriate energy expenditure required for the weight goal.;Weight Management/Obesity: Establish reasonable short term and long term weight goals.;Obesity: Provide education and appropriate resources to help participant work on and attain dietary goals.;Weight Management: Provide education and appropriate resources to help participant work on and attain dietary goals.    Admit Weight 300 lb 3.2 oz (136.2 kg)    Goal Weight: Short Term 260  lb (117.9 kg)    Goal Weight: Long Term 210 lb (95.3 kg)    Expected Outcomes Short Term: Continue to assess and modify interventions until short term weight is achieved;Long Term: Adherence to nutrition and physical activity/exercise program aimed toward attainment of established weight goal;Weight Loss: Understanding of general recommendations for a balanced deficit meal plan, which promotes 1-2 lb weight loss per week and includes a negative energy balance of [id number] kcal/d;Understanding recommendations for meals to include 15-35% energy as protein, 25-35% energy from fat,  35-60% energy from carbohydrates, less than 200mg  of dietary cholesterol, 20-35 gm of total fiber daily;Understanding of distribution of calorie intake throughout the day with the consumption of 4-5 meals/snacks    Diabetes Yes    Intervention Provide education about signs/symptoms and action to take for hypo/hyperglycemia.;Provide education about proper nutrition, including hydration, and aerobic/resistive exercise prescription along with prescribed medications to achieve blood glucose in normal ranges: Fasting glucose 65-99 mg/dL    Expected Outcomes Short Term: Participant verbalizes understanding of the signs/symptoms and immediate care of hyper/hypoglycemia, proper foot care and importance of medication, aerobic/resistive exercise and nutrition plan for blood glucose control.;Long Term: Attainment of HbA1C < 7%.    Heart Failure Yes    Intervention Provide a combined exercise and nutrition program that is supplemented with education, support and counseling about heart failure. Directed toward relieving symptoms such as shortness of breath, decreased exercise tolerance, and extremity edema.    Expected Outcomes Short term: Attendance in program 2-3 days a week with increased exercise capacity. Reported lower sodium intake. Reported increased fruit and vegetable intake. Reports medication compliance.;Improve functional capacity of life;Short term: Daily weights obtained and reported for increase. Utilizing diuretic protocols set by physician.;Long term: Adoption of self-care skills and reduction of barriers for early signs and symptoms recognition and intervention leading to self-care maintenance.    Hypertension Yes    Intervention Provide education on lifestyle modifcations including regular physical activity/exercise, weight management, moderate sodium restriction and increased consumption of fresh fruit, vegetables, and low fat dairy, alcohol moderation, and smoking cessation.;Monitor prescription use  compliance.    Expected Outcomes Short Term: Continued assessment and intervention until BP is < 140/45mm HG in hypertensive participants. < 130/10mm HG in hypertensive participants with diabetes, heart failure or chronic kidney disease.;Long Term: Maintenance of blood pressure at goal levels.    Lipids Yes    Intervention Provide education and support for participant on nutrition & aerobic/resistive exercise along with prescribed medications to achieve LDL 70mg , HDL >40mg .    Expected Outcomes Short Term: Participant states understanding of desired cholesterol values and is compliant with medications prescribed. Participant is following exercise prescription and nutrition guidelines.;Long Term: Cholesterol controlled with medications as prescribed, with individualized exercise RX and with personalized nutrition plan. Value goals: LDL < 70mg , HDL > 40 mg.              Personal Goals Discharge:   Exercise Goals and Review:  Exercise Goals     Row Name 02/10/23 1009             Exercise Goals   Increase Physical Activity Yes       Intervention Provide advice, education, support and counseling about physical activity/exercise needs.;Develop an individualized exercise prescription for aerobic and resistive training based on initial evaluation findings, risk stratification, comorbidities and participant's personal goals.       Expected Outcomes Long Term: Add in home exercise to make exercise part of routine and to increase amount  of physical activity.;Short Term: Attend rehab on a regular basis to increase amount of physical activity.;Long Term: Exercising regularly at least 3-5 days a week.       Increase Strength and Stamina Yes       Intervention Provide advice, education, support and counseling about physical activity/exercise needs.;Develop an individualized exercise prescription for aerobic and resistive training based on initial evaluation findings, risk stratification, comorbidities  and participant's personal goals.       Expected Outcomes Short Term: Increase workloads from initial exercise prescription for resistance, speed, and METs.;Short Term: Perform resistance training exercises routinely during rehab and add in resistance training at home;Long Term: Improve cardiorespiratory fitness, muscular endurance and strength as measured by increased METs and functional capacity ( )       Able to understand and use rate of perceived exertion (RPE) scale Yes       Intervention Provide education and explanation on how to use RPE scale       Expected Outcomes Short Term: Able to use RPE daily in rehab to express subjective intensity level;Long Term:  Able to use RPE to guide intensity level when exercising independently       Able to understand and use Dyspnea scale Yes       Intervention Provide education and explanation on how to use Dyspnea scale       Expected Outcomes Short Term: Able to use Dyspnea scale daily in rehab to express subjective sense of shortness of breath during exertion;Long Term: Able to use Dyspnea scale to guide intensity level when exercising independently       Knowledge and understanding of Target Heart Rate Range (THRR) Yes       Intervention Provide education and explanation of THRR including how the numbers were predicted and where they are located for reference       Expected Outcomes Short Term: Able to state/look up THRR;Short Term: Able to use daily as guideline for intensity in rehab;Long Term: Able to use THRR to govern intensity when exercising independently       Able to check pulse independently Yes       Intervention Provide education and demonstration on how to check pulse in carotid and radial arteries.;Review the importance of being able to check your own pulse for safety during independent exercise       Expected Outcomes Short Term: Able to explain why pulse checking is important during independent exercise;Long Term: Able to check pulse  independently and accurately       Understanding of Exercise Prescription Yes       Intervention Provide education, explanation, and written materials on patient's individual exercise prescription       Expected Outcomes Short Term: Able to explain program exercise prescription;Long Term: Able to explain home exercise prescription to exercise independently                Exercise Goals Re-Evaluation:  Exercise Goals Re-Evaluation     Row Name 02/15/23 0809 02/23/23 0819 03/10/23 1148 03/24/23 1521 04/07/23 1435     Exercise Goal Re-Evaluation   Exercise Goals Review Able to understand and use rate of perceived exertion (RPE) scale;Able to understand and use Dyspnea scale;Knowledge and understanding of Target Heart Rate Range (THRR);Understanding of Exercise Prescription Increase Physical Activity;Increase Strength and Stamina;Understanding of Exercise Prescription Increase Physical Activity;Increase Strength and Stamina;Understanding of Exercise Prescription Increase Physical Activity;Increase Strength and Stamina;Understanding of Exercise Prescription Increase Physical Activity;Increase Strength and Stamina;Understanding of Exercise Prescription   Comments Reviewed  RPE and dyspnea scale, THR and program prescription with pt today.  Pt voiced understanding and was given a copy of goals to take home. Brett Marshall continues to do well in rehab. He continues to get aquainted to the exercise machines that are prescribed. He was able to use the recumbent bike and T5 nustep at level 3. He was also able to use the treadmill at an intensity of 2 mph with no incline. We will continue to monitor his progress in the program. Brett Marshall is doing well in rehab. His treadmill workload has stayed consistent at 1.9-2.0 mph with no incline. He also has stayed consistent with his workloads on the T5 nustep, recumbent bike, and XR. He did improve to level 3 on the biostep. We will continue to monitor his progress in the program.  Brett Marshall has not attended rehab during this review. We will continue to reach out to make sure he is well. We will continue to monitor his progress in the program. Brett Marshall was able to attend one rehab session during this review. During the session he was able to increase his level on the T5 nustep from level 2 ot level 3. He was also able to maintain his intensity on the recumbent bike. We will continue to monitor his progress in the program.   Expected Outcomes Short: Use RPE daily to regulate intensity. Long: Follow program prescription in THR. Short: Continue to follow current exercise prescription. Long: Continue exercise to improve strength and stamina. Short: Begin to progressively increase treadmill workload. Long: Continue exercise to improve strength and stamina. Short: Return to rehab. Long: Continue to exercise to improve strength and stamina. Short: Continue to follow current exercise prescription. Long: Continue exercise to improve strength and stamina.    Row Name 04/19/23 1023             Exercise Goal Re-Evaluation   Exercise Goals Review Increase Physical Activity;Increase Strength and Stamina;Understanding of Exercise Prescription       Comments Brett Marshall has not attended rehab since 11/11. He has been in contact with [place] and stated that he will be back next week. We will continue to monitor his progress in the program.       Expected Outcomes Short: Continue to follow current exercise prescription. Long: Continue exercise to improve strength and stamina.                Nutrition & Weight - Outcomes:  Pre Biometrics - 02/10/23 1010       Pre Biometrics   Height 5' 9.7" (1.77 m)    Weight 300 lb 3.2 oz (136.2 kg)    Waist Circumference 54.5 inches    Hip Circumference 55 inches    Waist to Hip Ratio 0.99 %    BMI (Calculated) 43.46    Single Leg Stand 5 seconds              Nutrition:  Nutrition Therapy & Goals - 02/15/23 0946       Nutrition Therapy   Diet Carb  controlled, Cardiac, Low Na    Protein (specify units) 90    Fiber 30 grams    Whole Grain Foods 3 servings    Saturated Fats 15 max. grams    Fruits and Vegetables 5 servings/day    Sodium 2 grams      Personal Nutrition Goals   Nutrition Goal Write down on paper and low 1-2 days on app per week    Personal Goal #2 Watch carb sources, aim  for ~30-60g per meal    Personal Goal #3 Drink 3 bottles per day, cut back on diet soda    Comments Patient drinking ~32oz of water, drinks diet soda as well. He reports occasionally drinking juice. His A1C is 10.0% as of 10/28/22. Spoke with him about this, he is somewhat knowledgeable about carbs. He knew about simple sugars vs complex carbs, carb sources, and the goal of ~45g per meal. Asked if he counted carbs, he reported he didn't, says he would choose meals that were easiest and that both he and his wife likes. Encouraged him to be mindful of carb portions provided some examples of appropriate portions of carbs at his meals. Built out several meals and snacks with foods he likes and will eat, focusing on consistent carb portions (~30-60g each meal) with high quality carbs paired with lean protein. He likes veggies, encouraged him to eat them in large portions to help control carb portions on his plates. Provided and reviewed several Mediterranean diet handouts to promote more heart healthy eating. Educated on types of fats, sodium, and whole grains. He is optimistic he can make some of these changes. Will continue to monitor and encouraged him to meet with RD again in a few weeks      Intervention Plan   Intervention Prescribe, educate and counsel regarding individualized specific dietary modifications aiming towards targeted core components such as weight, hypertension, lipid management, diabetes, heart failure and other comorbidities.;Nutrition handout(s) given to patient.    Expected Outcomes Short Term Goal: Understand basic principles of dietary content,  such as calories, fat, sodium, cholesterol and nutrients.;Short Term Goal: A plan has been developed with personal nutrition goals set during dietitian appointment.;Long Term Goal: Adherence to prescribed nutrition plan.             Nutrition Discharge:   Education Questionnaire Score:  Knowledge Questionnaire Score - 03/01/23 1003       Knowledge Questionnaire Score   Pre Score 22/26             Goals reviewed with patient; copy given to patient.

## 2023-05-06 NOTE — Progress Notes (Signed)
Cardiac Individual Treatment Plan  Patient Details  Name: Brett Marshall MRN: [id number] Date of Birth: 09-28-1965 Referring Provider:   Flowsheet Row Cardiac Rehab from 02/10/2023 in [facility] Cardiac and Pulmonary Rehab  Referring Provider Lorine Bears, MD       Initial Encounter Date:  Flowsheet Row Cardiac Rehab from 02/10/2023 in [facility] Cardiac and Pulmonary Rehab  Date 02/10/23       Visit Diagnosis: S/P AVR (aortic valve replacement)  Status post coronary artery stent placement  Patient's Home Medications on Admission:  Current Outpatient Medications:    acetaminophen (TYLENOL) 325 MG tablet, Take 650 mg by mouth every 6 (six) hours as needed for moderate pain., Disp: , Rfl:    albuterol (VENTOLIN HFA) 108 (90 Base) MCG/ACT inhaler, Inhale 1-2 puffs into the lungs every 6 (six) hours as needed for wheezing or shortness of breath., Disp: 18 g, Rfl: 1   aspirin EC 81 MG tablet, Take 1 tablet (81 mg total) by mouth daily., Disp: 90 tablet, Rfl: 0   atorvastatin (LIPITOR) 40 MG tablet, Take 1 tablet (40 mg total) by mouth daily at 6 PM., Disp: 30 tablet, Rfl: 2   cholecalciferol (CHOLECALCIFEROL) 25 MCG tablet, Take 1 tablet (1,000 Units total) by mouth daily., Disp: 30 tablet, Rfl: 0   cyanocobalamin (VITAMIN B12) 1000 MCG tablet, Take 1 tablet (1,000 mcg total) by mouth daily., Disp: , Rfl:    cyclobenzaprine (FLEXERIL) 10 MG tablet, Take 1 tablet (10 mg total) by mouth 3 (three) times daily as needed.For spasms (Patient taking differently: Take 30 mg by mouth daily as needed for muscle spasms. For spasms), Disp: 90 tablet, Rfl: 5   DULoxetine (CYMBALTA) 60 MG capsule, Take 120 mg by mouth at bedtime., Disp: , Rfl:    famotidine (PEPCID) 40 MG tablet, Take 40 mg by mouth at bedtime., Disp: , Rfl:    ferrous sulfate 325 (65 FE) MG EC tablet, Take 1 tablet (325 mg total) by mouth daily with breakfast., Disp: 30 tablet, Rfl: 0   fluticasone (FLONASE) 50 MCG/ACT nasal spray, Place 1  spray into both nostrils daily as needed for allergies or rhinitis., Disp: , Rfl:    fluticasone-salmeterol (ADVAIR) 250-50 MCG/ACT AEPB, Inhale 1 puff into the lungs every 12 (twelve) hours., Disp: 60 each, Rfl: 6   furosemide (LASIX) 20 MG tablet, Take 1 tablet (20 mg total) by mouth daily as needed., Disp: 90 tablet, Rfl: 0   gabapentin (NEURONTIN) 300 MG capsule, Take 300 mg by mouth at bedtime., Disp: , Rfl:    glucose blood (GE100 BLOOD GLUCOSE TEST) test strip, Use as instructed, Disp: 100 each, Rfl: 12   ibuprofen (MOTRIN IB) 200 MG tablet, Take 2 tablets (400 mg total) by mouth 3 (three) times daily., Disp: 100 tablet, Rfl: 2   insulin glargine (LANTUS) 100 unit/mL SOPN, Inject 30 Units into the skin daily., Disp: 15 mL, Rfl: 11   JARDIANCE 25 MG TABS tablet, Take 25 mg by mouth daily., Disp: , Rfl:    Lancets (ONETOUCH ULTRASOFT) lancets, Check sugar twice daily, Disp: 100 each, Rfl: 12   lidocaine (LIDODERM) 5 %, Place 1 patch onto the skin daily. Remove & Discard patch within 12 hours or as directed by MD. May place on right or left side of chest (Patient taking differently: Place 1 patch onto the skin daily as needed (pain). Remove & Discard patch within 12 hours or as directed by MD. May place on right or left side of chest), Disp: 14  patch, Rfl: 0   metFORMIN (GLUCOPHAGE) 1000 MG tablet, Take 1 tablet (1,000 mg total) by mouth 2 (two) times daily with a meal., Disp: 180 tablet, Rfl: 1   metoprolol tartrate (LOPRESSOR) 25 MG tablet, Take 0.5 tablets (12.5 mg total) by mouth 2 (two) times daily., Disp: 90 tablet, Rfl: 1   traZODone (DESYREL) 50 MG tablet, Take 50 mg by mouth at bedtime., Disp: , Rfl:   Past Medical History: Past Medical History:  Diagnosis Date   Asthma    CAD (coronary artery disease)    a. 08/2017 Cath: Diff nonobs dzs, EF 45-50%, diff HK; b. 08/2021 MV: Ant/apical ishemia; c. 08/2021 PCI: LAD 95ost (4.0x15 Onyx Frontier DES), EF 55-65%; c. 09/2021 Cath: LM nl, LAD  patent stent, 40m, 30d, RI min irregs, LCX min irregs, RCA 50p. LVOT grad 50-22mmHg (rest), 70-157mmHg (provoked); d. 10/2022 Cath: LM nl, LAD 75m, 60d, RI min irregs, LCX min irregs, RCA 50p.   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 08/2017 LV gram: EF 45-50%, glob HK; b. 08/2021 Echo: EF 60-65%, no rwma, GrI DD, nl RV fxn; c. 09/2021 cMRI: EF 60%, no LVOT obstruction/gradient. Mild conc LVH, mild AS. No myocardial scar/fibrosis. No evidence of HCM.   Hyperlipidemia    Leg swelling    Left   Low back pain    Mixed ICM & NICM    a. 08/2017 LV gram: EF 45-50%, glob HK; b. 08/2021 Echo: EF 60-65%.   Morbid obesity (HCC)    OSA on CPAP    non compliant   Pulmonary hypertension (HCC)    Severe aortic stenosis    a. 10/2022 Cath: 31.46mmHg gradient and AVA 1.05cm^2; b. 10/2022 s/p 23mm Edwards Resilia bioprosthetic AoV, resection of sub-aortic membrane, and septal myectomy; c. 10/2022 Echo: EF 65-70%, no rwma, nl AoV structure/fxn.    Tobacco Use: Social History   Tobacco Use  Smoking Status Never  Smokeless Tobacco Never    Labs: Review Flowsheet  More data exists      Latest Ref Rng & Units 09/07/2022 09/08/2022 10/19/2022 10/28/2022 10/30/2022  Labs for ITP Cardiac and Pulmonary Rehab  Cholestrol 0 - 200 mg/dL - 409  - - -  LDL (calc) 0 - 99 mg/dL - 50  - - -  HDL-C >81 mg/dL - 31  - - -  Trlycerides <150 mg/dL - 191  - - -  Hemoglobin A1c 4.8 - 5.6 % 11.7  - - 10.0  -  PH, Arterial 7.35 - 7.45 - - 7.344  7.39  7.332  7.316  7.359  7.436  7.428   PCO2 arterial 32 - 48 mmHg - - 41.4  35  37.3  42.6  38.5  31.2  33.1   Bicarbonate 20.0 - 28.0 mmol/L - - 25.6  22.5  21.2  19.9  21.9  21.9  21.0  23.1  21.9   TCO2 22 - 32 mmol/L - - 27  24  - 21  23  23  22  22  26  26   24  23  23  26    Acid-base deficit 0.0 - 2.0 mmol/L - - 1.0  3.0  3.1  6.0  4.0  3.0  3.0  2.0  2.0   O2 Saturation % - - 61  93  100  98  99  99  99  67  100     Details       Multiple values from one day are sorted in reverse-chronological order          Exercise Target Goals: Exercise Program Goal: Individual exercise prescription set using results from initial 6 min walk test and THRR while considering  patient's activity barriers and safety.   Exercise Prescription Goal: Initial exercise prescription builds to 30-45 minutes a day of aerobic activity, 2-3 days per week.  Home exercise guidelines will be given to patient during program as part of exercise prescription that the participant will acknowledge.   Education: Aerobic Exercise: - Group verbal and visual presentation on the components of exercise prescription. Introduces F.I.T.T principle from ACSM for exercise prescriptions.  Reviews F.I.T.T. principles of aerobic exercise including progression. Written material given at graduation.   Education: Resistance Exercise: - Group verbal and visual presentation on the components of exercise prescription. Introduces F.I.T.T principle from ACSM for exercise prescriptions  Reviews F.I.T.T. principles of resistance exercise including progression. Written material given at graduation.    Education: Exercise & Equipment Safety: - Individual verbal instruction and demonstration of equipment use and safety with use of the equipment. Flowsheet Row Cardiac Rehab from 03/04/2023 in [facility] Cardiac and Pulmonary Rehab  Date 02/10/23  Educator MB  Instruction Review Code 1- Verbalizes Understanding       Education: Exercise Physiology & General Exercise Guidelines: - Group verbal and written instruction with models to review the exercise physiology of the cardiovascular system and associated critical values. Provides general exercise guidelines with specific guidelines to those with  heart or lung disease.  Flowsheet Row Cardiac Rehab from 03/04/2023 in [facility] Cardiac and Pulmonary Rehab  Date 03/04/23  Educator MB  Instruction Review Code 1- [employer] Understanding       Education: Flexibility, Balance, Mind/Body Relaxation: - Group verbal and visual presentation with interactive activity on the components of exercise prescription. Introduces F.I.T.T principle from ACSM for exercise prescriptions. Reviews F.I.T.T. principles of flexibility and balance exercise training including progression. Also discusses the mind body connection.  Reviews various relaxation techniques to help reduce and manage stress (i.e. Deep breathing, progressive muscle relaxation, and visualization). Balance handout provided to take home. Written material given at graduation.   Activity Barriers & Risk Stratification:  Activity Barriers & Cardiac Risk Stratification - 02/10/23 1004       Activity Barriers & Cardiac Risk Stratification   Activity Barriers Neck/Spine Problems;Shortness of Breath;Joint Problems;Back Problems    Cardiac Risk Stratification Moderate             6 Minute Walk:  6 Minute Walk     Row Name 02/10/23 1003         6 Minute Walk   Phase Initial     Distance 1050 feet     Walk Time 5.88 minutes     # of Rest Breaks 1     MPH 2.03     METS 2.73     RPE 14     Perceived Dyspnea  3     VO2 Peak 9.55     Symptoms Yes (comment)     Comments SOB     Resting HR 108 bpm     Resting BP 130/80     Resting Oxygen Saturation  100 %  Exercise Oxygen Saturation  during 6 min walk 100 %     Max Ex. HR 141 bpm     Max Ex. BP 142/90     2 Minute Post BP 132/90              Oxygen Initial Assessment:   Oxygen Re-Evaluation:   Oxygen Discharge (Final Oxygen Re-Evaluation):   Initial Exercise Prescription:  Initial Exercise Prescription - 02/10/23 1000       Date of Initial Exercise RX and Referring Provider   Date 02/10/23    Referring  Provider Lorine Bears, MD      Oxygen   Maintain Oxygen Saturation 88% or higher      Treadmill   MPH 2    Grade 0    Minutes 15    METs 2.53      Recumbant Bike   Level 2    RPM 50    Watts 31    Minutes 15    METs 2.73      REL-XR   Level 2    Speed 50    Minutes 15    METs 2.73      T5 Nustep   Level 2    SPM 80    Minutes 15    METs 2.73      Biostep-RELP   Level 2    SPM 50    Minutes 15    METs 2.73      Prescription Details   Frequency (times per week) 2    Duration Progress to 30 minutes of continuous aerobic without signs/symptoms of physical distress      Intensity   THRR 40-80% of Max Heartrate 130-152    Ratings of Perceived Exertion 11-13    Perceived Dyspnea 0-4      Progression   Progression Continue to progress workloads to maintain intensity without signs/symptoms of physical distress.      Resistance Training   Training Prescription Yes    Weight 5 lb    Reps 10-15             Perform Capillary Blood Glucose checks as needed.  Exercise Prescription Changes:   Exercise Prescription Changes     Row Name 02/10/23 1000 02/23/23 0800 03/10/23 1100 04/07/23 1400       Response to Exercise   Blood Pressure (Admit) 130/80 108/74 146/68 104/62    Blood Pressure (Exercise) 142/90 136/64 162/82 122/72    Blood Pressure (Exit) 132/90 138/86 124/80 102/56    Heart Rate (Admit) 108 bpm 99 bpm 104 bpm 102 bpm    Heart Rate (Exercise) 141 bpm 132 bpm 142 bpm 126 bpm    Heart Rate (Exit) 114 bpm 111 bpm 115 bpm 102 bpm    Oxygen Saturation (Admit) 100 % -- -- --    Oxygen Saturation (Exercise) 100 % -- -- --    Oxygen Saturation (Exit) 100 % -- -- --    Rating of Perceived Exertion (Exercise) 14 16 15 15     Perceived Dyspnea (Exercise) 3 0 -- 0    Symptoms SOB -- -- none    Comments results First 2 weeks of exercise -- --    Duration Progress to 30 minutes of  aerobic without signs/symptoms of physical distress Progress to 30  minutes of  aerobic without signs/symptoms of physical distress Progress to 30 minutes of  aerobic without signs/symptoms of physical distress Progress to 30 minutes of  aerobic without signs/symptoms of physical  distress    Intensity THRR New THRR unchanged THRR unchanged THRR unchanged      Progression   Progression Continue to progress workloads to maintain intensity without signs/symptoms of physical distress. Continue to progress workloads to maintain intensity without signs/symptoms of physical distress. Continue to progress workloads to maintain intensity without signs/symptoms of physical distress. Continue to progress workloads to maintain intensity without signs/symptoms of physical distress.    Average METs 2.73 2.45 2.5 2.5      Resistance Training   Training Prescription -- Yes Yes Yes    Weight -- 5 lb 5 lb 5 lb    Reps -- 10-15 10-15 10-15      Interval Training   Interval Training -- No No No      Treadmill   MPH -- 2 1.9 --    Grade -- 0 0 --    Minutes -- 15 15 --    METs -- 2.53 2.45 --      Recumbant Bike   Level -- 3 3 3     Watts -- 26 26 26     Minutes -- 15 15 15     METs -- 2.61 2.6 2.61      REL-XR   Level -- 2 2 --    Minutes -- 15 15 --      T5 Nustep   Level -- 3 2 3     Minutes -- 15 15 15     METs -- 2.2 2.1 2.3      Biostep-RELP   Level -- -- 3 --    Minutes -- -- 15 --    METs -- -- 3 --      Oxygen   Maintain Oxygen Saturation -- 88% or higher 88% or higher 88% or higher             Exercise Comments:   Exercise Comments     Row Name 02/15/23 0809           Exercise Comments First full day of exercise!  Patient was oriented to gym and equipment including functions, settings, policies, and procedures.  Patient's individual exercise prescription and treatment plan were reviewed.  All starting workloads were established based on the results of the 6 minute walk test done at initial orientation visit.  The plan for exercise  progression was also introduced and progression will be customized based on patient's performance and goals.                Exercise Goals and Review:   Exercise Goals     Row Name 02/10/23 1009             Exercise Goals   Increase Physical Activity Yes       Intervention Provide advice, education, support and counseling about physical activity/exercise needs.;Develop an individualized exercise prescription for aerobic and resistive training based on initial evaluation findings, risk stratification, comorbidities and participant's personal goals.       Expected Outcomes Long Term: Add in home exercise to make exercise part of routine and to increase amount of physical activity.;Short Term: Attend rehab on a regular basis to increase amount of physical activity.;Long Term: Exercising regularly at least 3-5 days a week.       Increase Strength and Stamina Yes       Intervention Provide advice, education, support and counseling about physical activity/exercise needs.;Develop an individualized exercise prescription for aerobic and resistive training based on initial evaluation findings, risk stratification, comorbidities and participant's personal goals.  Expected Outcomes Short Term: Increase workloads from initial exercise prescription for resistance, speed, and METs.;Short Term: Perform resistance training exercises routinely during rehab and add in resistance training at home;Long Term: Improve cardiorespiratory fitness, muscular endurance and strength as measured by increased METs and functional capacity ( )       Able to understand and use rate of perceived exertion (RPE) scale Yes       Intervention Provide education and explanation on how to use RPE scale       Expected Outcomes Short Term: Able to use RPE daily in rehab to express subjective intensity level;Long Term:  Able to use RPE to guide intensity level when exercising independently       Able to understand and use  Dyspnea scale Yes       Intervention Provide education and explanation on how to use Dyspnea scale       Expected Outcomes Short Term: Able to use Dyspnea scale daily in rehab to express subjective sense of shortness of breath during exertion;Long Term: Able to use Dyspnea scale to guide intensity level when exercising independently       Knowledge and understanding of Target Heart Rate Range (THRR) Yes       Intervention Provide education and explanation of THRR including how the numbers were predicted and where they are located for reference       Expected Outcomes Short Term: Able to state/look up THRR;Short Term: Able to use daily as guideline for intensity in rehab;Long Term: Able to use THRR to govern intensity when exercising independently       Able to check pulse independently Yes       Intervention Provide education and demonstration on how to check pulse in carotid and radial arteries.;Review the importance of being able to check your own pulse for safety during independent exercise       Expected Outcomes Short Term: Able to explain why pulse checking is important during independent exercise;Long Term: Able to check pulse independently and accurately       Understanding of Exercise Prescription Yes       Intervention Provide education, explanation, and written materials on patient's individual exercise prescription       Expected Outcomes Short Term: Able to explain program exercise prescription;Long Term: Able to explain home exercise prescription to exercise independently                Exercise Goals Re-Evaluation :  Exercise Goals Re-Evaluation     Row Name 02/15/23 0809 02/23/23 0819 03/10/23 1148 03/24/23 1521 04/07/23 1435     Exercise Goal Re-Evaluation   Exercise Goals Review Able to understand and use rate of perceived exertion (RPE) scale;Able to understand and use Dyspnea scale;Knowledge and understanding of Target Heart Rate Range (THRR);Understanding of Exercise  Prescription Increase Physical Activity;Increase Strength and Stamina;Understanding of Exercise Prescription Increase Physical Activity;Increase Strength and Stamina;Understanding of Exercise Prescription Increase Physical Activity;Increase Strength and Stamina;Understanding of Exercise Prescription Increase Physical Activity;Increase Strength and Stamina;Understanding of Exercise Prescription   Comments Reviewed RPE and dyspnea scale, THR and program prescription with pt today.  Pt voiced understanding and was given a copy of goals to take home. Brett Marshall continues to do well in rehab. He continues to get aquainted to the exercise machines that are prescribed. He was able to use the recumbent bike and T5 nustep at level 3. He was also able to use the treadmill at an intensity of 2 mph with no incline. We will continue to  monitor his progress in the program. Brett Marshall is doing well in rehab. His treadmill workload has stayed consistent at 1.9-2.0 mph with no incline. He also has stayed consistent with his workloads on the T5 nustep, recumbent bike, and XR. He did improve to level 3 on the biostep. We will continue to monitor his progress in the program. Brett Marshall has not attended rehab during this review. We will continue to reach out to make sure he is well. We will continue to monitor his progress in the program. Brett Marshall was able to attend one rehab session during this review. During the session he was able to increase his level on the T5 nustep from level 2 ot level 3. He was also able to maintain his intensity on the recumbent bike. We will continue to monitor his progress in the program.   Expected Outcomes Short: Use RPE daily to regulate intensity. Long: Follow program prescription in THR. Short: Continue to follow current exercise prescription. Long: Continue exercise to improve strength and stamina. Short: Begin to progressively increase treadmill workload. Long: Continue exercise to improve strength and stamina.  Short: Return to rehab. Long: Continue to exercise to improve strength and stamina. Short: Continue to follow current exercise prescription. Long: Continue exercise to improve strength and stamina.    Row Name 04/19/23 1023             Exercise Goal Re-Evaluation   Exercise Goals Review Increase Physical Activity;Increase Strength and Stamina;Understanding of Exercise Prescription       Comments Brett Marshall has not attended rehab since 11/11. He has been in contact with [place] and stated that he will be back next week. We will continue to monitor his progress in the program.       Expected Outcomes Short: Continue to follow current exercise prescription. Long: Continue exercise to improve strength and stamina.                Discharge Exercise Prescription (Final Exercise Prescription Changes):  Exercise Prescription Changes - 04/07/23 1400       Response to Exercise   Blood Pressure (Admit) 104/62    Blood Pressure (Exercise) 122/72    Blood Pressure (Exit) 102/56    Heart Rate (Admit) 102 bpm    Heart Rate (Exercise) 126 bpm    Heart Rate (Exit) 102 bpm    Rating of Perceived Exertion (Exercise) 15    Perceived Dyspnea (Exercise) 0    Symptoms none    Duration Progress to 30 minutes of  aerobic without signs/symptoms of physical distress    Intensity THRR unchanged      Progression   Progression Continue to progress workloads to maintain intensity without signs/symptoms of physical distress.    Average METs 2.5      Resistance Training   Training Prescription Yes    Weight 5 lb    Reps 10-15      Interval Training   Interval Training No      Recumbant Bike   Level 3    Watts 26    Minutes 15    METs 2.61      T5 Nustep   Level 3    Minutes 15    METs 2.3      Oxygen   Maintain Oxygen Saturation 88% or higher             Nutrition:  Target Goals: Understanding of nutrition guidelines, daily intake of sodium 1500mg , cholesterol 200mg , calories 30% from fat  and 7% or  less from saturated fats, daily to have 5 or more servings of fruits and vegetables.  Education: All About Nutrition: -Group instruction provided by verbal, written material, interactive activities, discussions, models, and posters to present general guidelines for heart healthy nutrition including fat, fiber, MyPlate, the role of sodium in heart healthy nutrition, utilization of the nutrition label, and utilization of this knowledge for meal planning. Follow up email sent as well. Written material given at graduation. Flowsheet Row Cardiac Rehab from 12/11/2021 in [facility] Cardiac and Pulmonary Rehab  Education need identified 10/21/21  Date 12/11/21  Educator MC  Instruction Review Code 1- Verbalizes Understanding       Biometrics:  Pre Biometrics - 02/10/23 1010       Pre Biometrics   Height 5' 9.7" (1.77 m)    Weight 300 lb 3.2 oz (136.2 kg)    Waist Circumference 54.5 inches    Hip Circumference 55 inches    Waist to Hip Ratio 0.99 %    BMI (Calculated) 43.46    Single Leg Stand 5 seconds              Nutrition Therapy Plan and Nutrition Goals:  Nutrition Therapy & Goals - 02/15/23 0946       Nutrition Therapy   Diet Carb controlled, Cardiac, Low Na    Protein (specify units) 90    Fiber 30 grams    Whole Grain Foods 3 servings    Saturated Fats 15 max. grams    Fruits and Vegetables 5 servings/day    Sodium 2 grams      Personal Nutrition Goals   Nutrition Goal Write down on paper and low 1-2 days on app per week    Personal Goal #2 Watch carb sources, aim for ~30-60g per meal    Personal Goal #3 Drink 3 bottles per day, cut back on diet soda    Comments Patient drinking ~32oz of water, drinks diet soda as well. He reports occasionally drinking juice. His A1C is 10.0% as of 10/28/22. Spoke with him about this, he is somewhat knowledgeable about carbs. He knew about simple sugars vs complex carbs, carb sources, and the goal of ~45g per meal. Asked if he  counted carbs, he reported he didn't, says he would choose meals that were easiest and that both he and his wife likes. Encouraged him to be mindful of carb portions provided some examples of appropriate portions of carbs at his meals. Built out several meals and snacks with foods he likes and will eat, focusing on consistent carb portions (~30-60g each meal) with high quality carbs paired with lean protein. He likes veggies, encouraged him to eat them in large portions to help control carb portions on his plates. Provided and reviewed several Mediterranean diet handouts to promote more heart healthy eating. Educated on types of fats, sodium, and whole grains. He is optimistic he can make some of these changes. Will continue to monitor and encouraged him to meet with RD again in a few weeks      Intervention Plan   Intervention Prescribe, educate and counsel regarding individualized specific dietary modifications aiming towards targeted core components such as weight, hypertension, lipid management, diabetes, heart failure and other comorbidities.;Nutrition handout(s) given to patient.    Expected Outcomes Short Term Goal: Understand basic principles of dietary content, such as calories, fat, sodium, cholesterol and nutrients.;Short Term Goal: A plan has been developed with personal nutrition goals set during dietitian appointment.;Long Term Goal: Adherence to prescribed nutrition  plan.             Nutrition Assessments:  MEDIFICTS Score Key: >=70 Need to make dietary changes  40-70 Heart Healthy Diet <= 40 Therapeutic Level Cholesterol Diet  Flowsheet Row Cardiac Rehab from 03/01/2023 in [facility] Cardiac and Pulmonary Rehab  Picture Your Plate Total Score on Admission 45      Picture Your Plate Scores: <16 Unhealthy dietary pattern with much room for improvement. 41-50 Dietary pattern unlikely to meet recommendations for good health and room for improvement. 51-60 More healthful dietary  pattern, with some room for improvement.  >60 Healthy dietary pattern, although there may be some specific behaviors that could be improved.    Nutrition Goals Re-Evaluation:   Nutrition Goals Discharge (Final Nutrition Goals Re-Evaluation):   Psychosocial: Target Goals: Acknowledge presence or absence of significant depression and/or stress, maximize coping skills, provide positive support system. Participant is able to verbalize types and ability to use techniques and skills needed for reducing stress and depression.   Education: Stress, Anxiety, and Depression - Group verbal and visual presentation to define topics covered.  Reviews how body is impacted by stress, anxiety, and depression.  Also discusses healthy ways to reduce stress and to treat/manage anxiety and depression.  Written material given at graduation. Flowsheet Row Cardiac Rehab from 03/04/2023 in [facility] Cardiac and Pulmonary Rehab  Date 02/25/23  Educator SB  Instruction Review Code 1- [employer] Understanding       Education: Sleep Hygiene -Provides group verbal and written instruction about how sleep can affect your health.  Define sleep hygiene, discuss sleep cycles and impact of sleep habits. Review good sleep hygiene tips.    Initial Review & Psychosocial Screening:  Initial Psych Review & Screening - 02/02/23 1317       Initial Review   Current issues with None Identified      Family Dynamics   Good Support System? Yes   wife, family, friends, church family     Barriers   Psychosocial barriers to participate in program There are no identifiable barriers or psychosocial needs.      Screening Interventions   Interventions Encouraged to exercise;To provide support and resources with identified psychosocial needs;Provide feedback about the scores to participant    Expected Outcomes Short Term goal: Utilizing psychosocial counselor, staff and physician to assist with identification of specific Stressors or  current issues interfering with healing process. Setting desired goal for each stressor or current issue identified.;Long Term Goal: Stressors or current issues are controlled or eliminated.;Short Term goal: Identification and review with participant of any Quality of Life or Depression concerns found by scoring the questionnaire.;Long Term goal: The participant improves quality of Life and PHQ9 Scores as seen by post scores and/or verbalization of changes             Quality of Life Scores:   Quality of Life - 03/01/23 1005       Quality of Life   Select Quality of Life      Quality of Life Scores   Health/Function Pre 14.13 %    Socioeconomic Pre 19.71 %    Psych/Spiritual Pre 18 %    Family Pre 19.5 %    GLOBAL Pre 16.79 %            Scores of 19 and below usually indicate a poorer quality of life in these areas.  A difference of  2-3 points is a clinically meaningful difference.  A difference of 2-3 points in  the total score of the Quality of Life Index has been associated with significant improvement in overall quality of life, self-image, physical symptoms, and general health in studies assessing change in quality of life.  PHQ-9: Review Flowsheet  More data exists      02/10/2023 12/29/2021 12/08/2021 11/13/2021 10/21/2021  Depression screen PHQ 2/9  Decreased Interest 1 0 0 1 2  Down, Depressed, Hopeless 0 0 1 0 0  PHQ - 2 Score 1 0 1 1 2   Altered sleeping 2 1 2 2 2   Tired, decreased energy 3 2 2 2 3   Change in appetite 0 0 0 1 2  Feeling bad or failure about yourself  0 0 0 0 0  Trouble concentrating 0 0 1 0 0  Moving slowly or fidgety/restless 1 0 1 0 1  Suicidal thoughts 0 0 0 0 0  PHQ-9 Score 7 3 7 6 10   Difficult doing work/chores Somewhat difficult Somewhat difficult Somewhat difficult Somewhat difficult Somewhat difficult   Interpretation of Total Score  Total Score Depression Severity:  1-4 = Minimal depression, 5-9 = Mild depression, 10-14 = Moderate  depression, 15-19 = Moderately severe depression, 20-27 = Severe depression   Psychosocial Evaluation and Intervention:  Psychosocial Evaluation - 02/02/23 1323       Psychosocial Evaluation & Interventions   Interventions Encouraged to exercise with the program and follow exercise prescription    Comments Brett Marshall 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 He reports that prior to returning to work this week, he was stressed about being on short term disability and the process of getting back into work. However, his boss and coworkers have been very supportive and working with him every step to make sure his transition back to full time is smooth. He is very grateful for his wife, family, and friends as they have been a big support. He has been working on losing weight and is proud of his progress. He wants to continue working on his weight and risk factor management.    Expected Outcomes Short: attend cardiac rehab for education and exercise. Long: develop and maintain positive self care habits.    Continue Psychosocial Services  Follow up required by staff             Psychosocial Re-Evaluation:   Psychosocial Discharge (Final Psychosocial Re-Evaluation):   Vocational Rehabilitation: Provide vocational rehab assistance to qualifying candidates.   Vocational Rehab Evaluation & Intervention:  Vocational Rehab - 02/02/23 1315       Initial Vocational Rehab Evaluation & Intervention   Assessment shows need for Vocational Rehabilitation No             Education: Education Goals: Education classes will be provided on a variety of topics geared toward better understanding of heart health and risk factor modification. Participant will state understanding/return demonstration of topics presented as noted by education test scores.  Learning Barriers/Preferences:  Learning Barriers/Preferences - 02/02/23 1323       Learning Barriers/Preferences   Learning Barriers None     Learning Preferences None             General Cardiac Education Topics:  AED/CPR: - Group verbal and written instruction with the use of models to demonstrate the basic use of the AED with the basic ABC's of resuscitation.   Anatomy and Cardiac Procedures: - Group verbal and visual presentation and models provide information about basic cardiac anatomy and function. Reviews the testing methods done to diagnose heart  disease and the outcomes of the test results. Describes the treatment choices: Medical Management, Angioplasty, or Coronary Bypass Surgery for treating various heart conditions including Myocardial Infarction, Angina, Valve Disease, and Cardiac Arrhythmias.  Written material given at graduation. Flowsheet Row Cardiac Rehab from 12/11/2021 in [facility] Cardiac and Pulmonary Rehab  Education need identified 10/21/21  Date 11/27/21  Educator SB  Instruction Review Code 1- Verbalizes Understanding       Medication Safety: - Group verbal and visual instruction to review commonly prescribed medications for heart and lung disease. Reviews the medication, class of the drug, and side effects. Includes the steps to properly store meds and maintain the prescription regimen.  Written material given at graduation.   Intimacy: - Group verbal instruction through game format to discuss how heart and lung disease can affect sexual intimacy. Written material given at graduation..   Know Your Numbers and Heart Failure: - Group verbal and visual instruction to discuss disease risk factors for cardiac and pulmonary disease and treatment options.  Reviews associated critical values for Overweight/Obesity, Hypertension, Cholesterol, and Diabetes.  Discusses basics of heart failure: signs/symptoms and treatments.  Introduces Heart Failure Zone chart for action plan for heart failure.  Written material given at graduation. Flowsheet Row Cardiac Rehab from 03/04/2023 in [facility] Cardiac and Pulmonary  Rehab  Date 02/18/23  Educator SB  Instruction Review Code 1- Verbalizes Understanding       Infection Prevention: - Provides verbal and written material to individual with discussion of infection control including proper hand washing and proper equipment cleaning during exercise session. Flowsheet Row Cardiac Rehab from 03/04/2023 in [facility] Cardiac and Pulmonary Rehab  Date 02/10/23  Educator MB  Instruction Review Code 1- Verbalizes Understanding       Falls Prevention: - Provides verbal and written material to individual with discussion of falls prevention and safety. Flowsheet Row Cardiac Rehab from 03/04/2023 in [facility] Cardiac and Pulmonary Rehab  Date 02/10/23  Educator MB  Instruction Review Code 1- Verbalizes Understanding       Other: -Provides group and verbal instruction on various topics (see comments)   Knowledge Questionnaire Score:  Knowledge Questionnaire Score - 03/01/23 1003       Knowledge Questionnaire Score   Pre Score 22/26             Core Components/Risk Factors/Patient Goals at Admission:  Personal Goals and Risk Factors at Admission - 02/10/23 1012       Core Components/Risk Factors/Patient Goals on Admission    Weight Management Yes;Obesity;Weight Loss    Intervention Weight Management: Develop a combined nutrition and exercise program designed to reach desired caloric intake, while maintaining appropriate intake of nutrient and fiber, sodium and fats, and appropriate energy expenditure required for the weight goal.;Weight Management/Obesity: Establish reasonable short term and long term weight goals.;Obesity: Provide education and appropriate resources to help participant work on and attain dietary goals.;Weight Management: Provide education and appropriate resources to help participant work on and attain dietary goals.    Admit Weight 300 lb 3.2 oz (136.2 kg)    Goal Weight: Short Term 260 lb (117.9 kg)    Goal Weight: Long Term 210 lb  (95.3 kg)    Expected Outcomes Short Term: Continue to assess and modify interventions until short term weight is achieved;Long Term: Adherence to nutrition and physical activity/exercise program aimed toward attainment of established weight goal;Weight Loss: Understanding of general recommendations for a balanced deficit meal plan, which promotes 1-2 lb weight loss per week and  includes a negative energy balance of [phone] kcal/d;Understanding recommendations for meals to include 15-35% energy as protein, 25-35% energy from fat, 35-60% energy from carbohydrates, less than 200mg  of dietary cholesterol, 20-35 gm of total fiber daily;Understanding of distribution of calorie intake throughout the day with the consumption of 4-5 meals/snacks    Diabetes Yes    Intervention Provide education about signs/symptoms and action to take for hypo/hyperglycemia.;Provide education about proper nutrition, including hydration, and aerobic/resistive exercise prescription along with prescribed medications to achieve blood glucose in normal ranges: Fasting glucose 65-99 mg/dL    Expected Outcomes Short Term: Participant verbalizes understanding of the signs/symptoms and immediate care of hyper/hypoglycemia, proper foot care and importance of medication, aerobic/resistive exercise and nutrition plan for blood glucose control.;Long Term: Attainment of HbA1C < 7%.    Heart Failure Yes    Intervention Provide a combined exercise and nutrition program that is supplemented with education, support and counseling about heart failure. Directed toward relieving symptoms such as shortness of breath, decreased exercise tolerance, and extremity edema.    Expected Outcomes Short term: Attendance in program 2-3 days a week with increased exercise capacity. Reported lower sodium intake. Reported increased fruit and vegetable intake. Reports medication compliance.;Improve functional capacity of life;Short term: Daily weights obtained and  reported for increase. Utilizing diuretic protocols set by physician.;Long term: Adoption of self-care skills and reduction of barriers for early signs and symptoms recognition and intervention leading to self-care maintenance.    Hypertension Yes    Intervention Provide education on lifestyle modifcations including regular physical activity/exercise, weight management, moderate sodium restriction and increased consumption of fresh fruit, vegetables, and low fat dairy, alcohol moderation, and smoking cessation.;Monitor prescription use compliance.    Expected Outcomes Short Term: Continued assessment and intervention until BP is < 140/21mm HG in hypertensive participants. < 130/92mm HG in hypertensive participants with diabetes, heart failure or chronic kidney disease.;Long Term: Maintenance of blood pressure at goal levels.    Lipids Yes    Intervention Provide education and support for participant on nutrition & aerobic/resistive exercise along with prescribed medications to achieve LDL 70mg , HDL >40mg .    Expected Outcomes Short Term: Participant states understanding of desired cholesterol values and is compliant with medications prescribed. Participant is following exercise prescription and nutrition guidelines.;Long Term: Cholesterol controlled with medications as prescribed, with individualized exercise RX and with personalized nutrition plan. Value goals: LDL < 70mg , HDL > 40 mg.             Education:Diabetes - Individual verbal and written instruction to review signs/symptoms of diabetes, desired ranges of glucose level fasting, after meals and with exercise. Acknowledge that pre and post exercise glucose checks will be done for 3 sessions at entry of program. Flowsheet Row Cardiac Rehab from 03/04/2023 in [facility] Cardiac and Pulmonary Rehab  Date 02/10/23  Educator MB  Instruction Review Code 1- Verbalizes Understanding       Core Components/Risk Factors/Patient Goals Review:     Core Components/Risk Factors/Patient Goals at Discharge (Final Review):    ITP Comments:  ITP Comments     Row Name 02/10/23 1002 02/15/23 0809 02/17/23 1220 03/17/23 1310 04/07/23 1010   ITP Comments Completed and gym orientation. Initial ITP created and sent for review to Dr. Bethann Punches, Medical Director. First full day of exercise!  Patient was oriented to gym and equipment including functions, settings, policies, and procedures.  Patient's individual exercise prescription and treatment plan were reviewed.  All starting workloads were established based on  the results of the 6 minute walk test done at initial orientation visit.  The plan for exercise progression was also introduced and progression will be customized based on patient's performance and goals. 30 Day review completed. Medical Director ITP review done, changes made as directed, and signed approval by Medical Director.    new to program 30 Day review completed. Medical Director ITP review done, changes made as directed, and signed approval by Medical Director. 30 Day review completed. Medical Director ITP review done, changes made as directed, and signed approval by Medical Director.    Row Name 05/05/23 1118 05/06/23 0757         ITP Comments 30 Day review completed. Medical Director ITP review done, changes made as directed, and signed approval by Medical Director.    last visit 11/11 Called patient to inform him that he has been out since 03/29/2023. Patient has had 11 sessions since 02/15/23. Informed patient of attendance and when his availability opens up to obtain a new referral. Patient verbalizes understanding. Patient discharged.               Comments: discharge ITP

## 2023-05-27 ENCOUNTER — Emergency Department
Admission: EM | Admit: 2023-05-27 | Discharge: 2023-05-28 | Disposition: A | Discharge status: Home / Self Care | Payer: BLUE CROSS/BLUE SHIELD | Attending: Emergency Medicine | Admitting: Emergency Medicine

## 2023-05-27 ENCOUNTER — Emergency Department: Payer: BLUE CROSS/BLUE SHIELD

## 2023-05-27 ENCOUNTER — Encounter: Payer: BLUE CROSS/BLUE SHIELD | Admitting: *Deleted

## 2023-05-27 DIAGNOSIS — Z79899 Other long term (current) drug therapy: Secondary | ICD-10-CM | POA: Insufficient documentation

## 2023-05-27 DIAGNOSIS — J45909 Unspecified asthma, uncomplicated: Secondary | ICD-10-CM | POA: Insufficient documentation

## 2023-05-27 DIAGNOSIS — W109XXA Fall (on) (from) unspecified stairs and steps, initial encounter: Secondary | ICD-10-CM | POA: Diagnosis not present

## 2023-05-27 DIAGNOSIS — I110 Hypertensive heart disease with heart failure: Secondary | ICD-10-CM | POA: Diagnosis not present

## 2023-05-27 DIAGNOSIS — M79661 Pain in right lower leg: Secondary | ICD-10-CM | POA: Diagnosis present

## 2023-05-27 DIAGNOSIS — L03115 Cellulitis of right lower limb: Secondary | ICD-10-CM

## 2023-05-27 DIAGNOSIS — E119 Type 2 diabetes mellitus without complications: Secondary | ICD-10-CM | POA: Diagnosis not present

## 2023-05-27 DIAGNOSIS — Z7982 Long term (current) use of aspirin: Secondary | ICD-10-CM | POA: Insufficient documentation

## 2023-05-27 DIAGNOSIS — I2510 Atherosclerotic heart disease of native coronary artery without angina pectoris: Secondary | ICD-10-CM | POA: Diagnosis not present

## 2023-05-27 DIAGNOSIS — I5030 Unspecified diastolic (congestive) heart failure: Secondary | ICD-10-CM | POA: Insufficient documentation

## 2023-05-27 DIAGNOSIS — S92501A Displaced unspecified fracture of right lesser toe(s), initial encounter for closed fracture: Secondary | ICD-10-CM

## 2023-05-27 DIAGNOSIS — Z794 Long term (current) use of insulin: Secondary | ICD-10-CM | POA: Diagnosis not present

## 2023-05-27 DIAGNOSIS — S92531A Displaced fracture of distal phalanx of right lesser toe(s), initial encounter for closed fracture: Secondary | ICD-10-CM | POA: Insufficient documentation

## 2023-05-27 DIAGNOSIS — Z7984 Long term (current) use of oral hypoglycemic drugs: Secondary | ICD-10-CM | POA: Insufficient documentation

## 2023-05-27 LAB — CBC
HCT: 39.8 % (ref 39.0–52.0)
Hemoglobin: 13 g/dL (ref 13.0–17.0)
MCH: 26.7 pg (ref 26.0–34.0)
MCHC: 32.7 g/dL (ref 30.0–36.0)
MCV: 81.9 fL (ref 80.0–100.0)
Platelets: 305 10*3/uL (ref 150–400)
RBC: 4.86 MIL/uL (ref 4.22–5.81)
RDW: 15.4 % (ref 11.5–15.5)
WBC: 10.5 10*3/uL (ref 4.0–10.5)
nRBC: 0 % (ref 0.0–0.2)

## 2023-05-27 LAB — BASIC METABOLIC PANEL
Anion gap: 12 (ref 5–15)
BUN: 20 mg/dL (ref 6–20)
CO2: 26 mmol/L (ref 22–32)
Calcium: 9.1 mg/dL (ref 8.9–10.3)
Chloride: 98 mmol/L (ref 98–111)
Creatinine, Ser: 1.2 mg/dL (ref 0.61–1.24)
GFR, Estimated: >60 mL/min (ref >60)
Glucose, Bld: 139 mg/dL — ABNORMAL HIGH (ref 70–99)
Potassium: 4.8 mmol/L (ref 3.5–5.1)
Sodium: 136 mmol/L (ref 135–145)

## 2023-05-27 MED ORDER — PIPERACILLIN-TAZOBACTAM 3.375 G IVPB 30 MIN
3.3750 g | Freq: Once | INTRAVENOUS | Status: AC
  Administered 2023-05-28: 3.375 g via INTRAVENOUS
  Filled 2023-05-27 (×2): qty 50

## 2023-05-27 MED ORDER — SODIUM CHLORIDE 0.9 % IV BOLUS
1000.0000 mL | Freq: Once | INTRAVENOUS | Status: AC
  Administered 2023-05-28: 1000 mL via INTRAVENOUS

## 2023-05-27 MED ORDER — DOXYCYCLINE HYCLATE 100 MG IV SOLR
100.0000 mg | Freq: Once | INTRAVENOUS | Status: AC
  Administered 2023-05-28: 100 mg via INTRAVENOUS
  Filled 2023-05-27: qty 100

## 2023-05-27 MED ORDER — MORPHINE SULFATE (PF) 4 MG/ML IV SOLN
4.0000 mg | Freq: Once | INTRAVENOUS | Status: AC
  Administered 2023-05-28: 4 mg via INTRAVENOUS
  Filled 2023-05-27: qty 1

## 2023-05-27 MED ORDER — ONDANSETRON HCL 4 MG/2ML IJ SOLN
4.0000 mg | Freq: Once | INTRAMUSCULAR | Status: AC
  Administered 2023-05-28: 4 mg via INTRAVENOUS
  Filled 2023-05-27: qty 2

## 2023-05-27 NOTE — ED Triage Notes (Signed)
 Pt states that he had a fall last week down some stairs and cut his pinky toe on the R foot, pt is diabetic, was seen at a hospital and placed on antibiotics. Pt has since had severe swelling in the R lower leg, some intermittent SOB as well.

## 2023-05-27 NOTE — ED Provider Notes (Signed)
 [facility] Provider Note    Event Date/Time   First MD Initiated Contact with Patient 05/27/23 2346     (approximate)   History   Foot Pain   HPI  Brett Marshall is a 58 y.o. male who presents to the ED from home with a chief complaint of right toe and lower leg pain.  Patient was in [place]  last week, fell down some steps, cutting his right pinky toe.  He was medically evaluated at that time with negative x-rays and placed on Keflex .  Patient is a diabetic with blood sugars ranging in the 180s-210s.  Presents for swelling, redness and pain from the foot into his right lower leg.  Endorses occasional shortness of breath.  Denies fever/chills, chest pain, abdominal pain, nausea, vomiting or dizziness.  Tetanus up-to-date.     Past Medical History   Past Medical History:  Diagnosis Date   Asthma    CAD (coronary artery disease)    a. 08/2017 Cath: Diff nonobs dzs, EF 45-50%, diff HK; b. 08/2021 MV: Ant/apical ishemia; c. 08/2021 PCI: LAD 95ost (4.0x15 Onyx Frontier DES), EF 55-65%; c. 09/2021 Cath: LM nl, LAD patent stent, 23m, 30d, RI min irregs, LCX min irregs, RCA 50p. LVOT grad 50-9mmHg (rest), 70-119mmHg (provoked); d. 10/2022 Cath: LM nl, LAD 77m, 60d, RI min irregs, LCX min irregs, RCA 50p.   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 08/2017 LV gram: EF 45-50%, glob HK; b. 08/2021 Echo: EF 60-65%, no rwma, GrI DD, nl RV fxn; c. 09/2021 cMRI: EF 60%, no LVOT obstruction/gradient. Mild conc LVH, mild AS. No myocardial scar/fibrosis. No evidence of HCM.   Hyperlipidemia    Leg swelling    Left   Low back pain    Mixed ICM & NICM    a. 08/2017 LV gram: EF 45-50%, glob HK; b. 08/2021 Echo: EF 60-65%.   Morbid obesity (HCC)     OSA on CPAP    non compliant   Pulmonary hypertension (HCC)    Severe aortic stenosis    a. 10/2022 Cath: 31.5mmHg gradient and AVA 1.05cm^2; b. 10/2022 s/p 23mm Edwards Resilia bioprosthetic AoV, resection of sub-aortic membrane, and septal myectomy; c. 10/2022 Echo: EF 65-70%, no rwma, nl AoV structure/fxn.     Active Problem List   Patient Active Problem List   Diagnosis Date Noted   Failure to thrive in adult 11/11/2022   S/P AVR (aortic valve replacement) 10/30/2022   Endotracheal tube present 10/30/2022   Difficult airway for intubation 10/30/2022   Severe aortic stenosis 10/30/2022   Aortic valve stenosis 10/19/2022   Mild intermittent asthma without complication 09/14/2022   Angina pectoris (HCC) 09/08/2022   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   Chronic diastolic CHF (congestive heart failure) (HCC) 09/07/2022   Abdominal pain 09/07/2022   HLD (hyperlipidemia) 09/07/2022   CAD (coronary artery disease) 09/07/2022   Depression 09/07/2022   Dark stools 09/07/2022   AKI (acute kidney injury) (HCC) 09/17/2021   Obstructive hypertrophic cardiomyopathy (HCC) 09/17/2021   Coronary artery disease involving native coronary artery of native heart with unstable angina pectoris (HCC)    Spontaneous hematoma of forearm    Unstable  angina (HCC)    Chest pain 09/05/2021   Controlled type 2 diabetes mellitus without complication, without long-term current use of insulin  (HCC) 09/05/2021   Dyspnea 10/21/2020   Hypercholesterolemia 06/05/2019   Encounter for screening colonoscopy    Polyp of sigmoid colon    Gastroesophageal reflux disease without esophagitis 04/10/2018   OSA on CPAP 04/10/2018   Heart failure with mildly reduced ejection fraction (HFmrEF) (HCC) 12/09/2017   Pulmonary hypertension (HCC) 12/09/2017   NASH (nonalcoholic steatohepatitis) 11/07/2017   Essential hypertension 09/13/2016   DISH (diffuse idiopathic skeletal hyperostosis) 05/17/2016   DDD  (degenerative disc disease), lumbar 05/17/2016   Type 2 diabetes mellitus without complication, without long-term current use of insulin  (HCC) 02/08/2016   Gynecomastia 02/08/2016   Morbid obesity (HCC) 02/08/2016     Past Surgical History   Past Surgical History:  Procedure Laterality Date   AORTIC ROOT ENLARGEMENT N/A 10/30/2022   Procedure: AORTIC ROOT ENLARGEMENT AND SUBAORTIC MYOMECTOMY;  Surgeon: Maryjane Mt, MD;  Location: [facility] OR;  Service: Open Heart Surgery;  Laterality: N/A;   AORTIC VALVE REPLACEMENT N/A 10/30/2022   Procedure: AORTIC VALVE REPLACEMENT (AVR) USING INSPIRIS RESILIA AORTIC VALVE;  Surgeon: Maryjane Mt, MD;  Location: [facility] OR;  Service: Open Heart Surgery;  Laterality: N/A;  Median sternotomy   CARDIAC CATHETERIZATION     [place] no stents.   COLONOSCOPY WITH PROPOFOL  N/A 04/11/2019   Procedure: COLONOSCOPY WITH PROPOFOL ;  Surgeon: Jinny Carmine, MD;  Location: [facility] ENDOSCOPY;  Service: Endoscopy;  Laterality: N/A;   CORONARY STENT INTERVENTION N/A 09/08/2021   Procedure: CORONARY STENT INTERVENTION;  Surgeon: Darron Deatrice LABOR, MD;  Location: [facility] INVASIVE CV LAB;  Service: Cardiovascular;  Laterality: N/A;   CORONARY ULTRASOUND/IVUS N/A 09/08/2021   Procedure: Intravascular Ultrasound/IVUS;  Surgeon: Darron Deatrice LABOR, MD;  Location: [facility] INVASIVE CV LAB;  Service: Cardiovascular;  Laterality: N/A;   LEFT HEART CATH AND CORONARY ANGIOGRAPHY N/A 09/08/2021   Procedure: LEFT HEART CATH AND CORONARY ANGIOGRAPHY;  Surgeon: Darron Deatrice LABOR, MD;  Location: [facility] INVASIVE CV LAB;  Service: Cardiovascular;  Laterality: N/A;   LEFT HEART CATH AND CORONARY ANGIOGRAPHY N/A 09/17/2021   Procedure: LEFT HEART CATH AND CORONARY ANGIOGRAPHY;  Surgeon: Mady Bruckner, MD;  Location: [facility] INVASIVE CV LAB;  Service: Cardiovascular;  Laterality: N/A;   LEFT HEART CATH AND CORONARY ANGIOGRAPHY N/A 09/08/2022   Procedure: LEFT HEART CATH AND CORONARY ANGIOGRAPHY;  Surgeon: Mady Bruckner, MD;  Location: [facility] INVASIVE CV LAB;  Service: Cardiovascular;  Laterality: N/A;   RIGHT/LEFT HEART CATH AND CORONARY ANGIOGRAPHY N/A 09/02/2017   Procedure: RIGHT/LEFT HEART CATH AND CORONARY ANGIOGRAPHY;  Surgeon: Darron Deatrice LABOR, MD;  Location: [facility] INVASIVE CV LAB;  Service: Cardiovascular;  Laterality: N/A;   RIGHT/LEFT HEART CATH AND CORONARY ANGIOGRAPHY Bilateral 10/19/2022   Procedure: RIGHT/LEFT HEART CATH AND CORONARY ANGIOGRAPHY;  Surgeon: Darron Deatrice LABOR, MD;  Location: [facility] INVASIVE CV LAB;  Service: Cardiovascular;  Laterality: Bilateral;   TEE WITHOUT CARDIOVERSION N/A 09/10/2022   Procedure: TRANSESOPHAGEAL ECHOCARDIOGRAM;  Surgeon: Perla Evalene PARAS, MD;  Location: [facility] ORS;  Service: Cardiovascular;  Laterality: N/A;   TEE WITHOUT CARDIOVERSION N/A 10/30/2022   Procedure: TRANSESOPHAGEAL ECHOCARDIOGRAM;  Surgeon: Maryjane Mt, MD;  Location: [facility] OR;  Service: Open Heart Surgery;  Laterality: N/A;     Home Medications   Prior to Admission medications   Medication Sig Start Date End Date Taking? Authorizing Provider  acetaminophen  (TYLENOL ) 325 MG tablet Take 650 mg by mouth every 6 (six)  hours as needed for moderate pain.    [provider]  albuterol  (VENTOLIN  HFA) 108 (90 Base) MCG/ACT inhaler Inhale 1-2 puffs into the lungs every 6 (six) hours as needed for wheezing or shortness of breath. 09/11/20   Hope Almarie ORN, NP  aspirin  EC 81 MG tablet Take 1 tablet (81 mg total) by mouth daily. 03/15/23   Darron Deatrice LABOR, MD  atorvastatin  (LIPITOR) 40 MG tablet Take 1 tablet (40 mg total) by mouth daily at 6 PM. 11/05/22   Zimmerman, Donielle M, PA-C  cholecalciferol  (CHOLECALCIFEROL ) 25 MCG tablet Take 1 tablet (1,000 Units total) by mouth daily. 11/23/22   Danton Reyes DASEN, MD  cyanocobalamin  (VITAMIN B12) 1000 MCG tablet Take 1 tablet (1,000 mcg total) by mouth daily. 11/22/22   Danton Reyes DASEN, MD  cyclobenzaprine  (FLEXERIL ) 10 MG tablet Take 1 tablet  (10 mg total) by mouth 3 (three) times daily as needed.For spasms Patient taking differently: Take 30 mg by mouth daily as needed for muscle spasms. For spasms 11/20/17   Smith, Kristi M, MD  DULoxetine  (CYMBALTA ) 60 MG capsule Take 120 mg by mouth at bedtime. 08/10/22   [provider]  famotidine  (PEPCID ) 40 MG tablet Take 40 mg by mouth at bedtime. 08/14/18   [provider]  ferrous sulfate  325 (65 FE) MG EC tablet Take 1 tablet (325 mg total) by mouth daily with breakfast. 11/05/22 11/05/23  Dwan Aldo M, PA-C  fluticasone  (FLONASE) 50 MCG/ACT nasal spray Place 1 spray into both nostrils daily as needed for allergies or rhinitis.    [provider]  fluticasone -salmeterol (ADVAIR) 250-50 MCG/ACT AEPB Inhale 1 puff into the lungs every 12 (twelve) hours. 01/29/22   Hope Almarie ORN, NP  furosemide  (LASIX ) 20 MG tablet Take 1 tablet (20 mg total) by mouth daily as needed. 01/15/23 04/15/23  Darron Deatrice LABOR, MD  gabapentin  (NEURONTIN ) 300 MG capsule Take 300 mg by mouth at bedtime. 09/04/22 09/04/23  [provider]  glucose blood (GE100 BLOOD GLUCOSE TEST) test strip Use as instructed 02/03/16   Claudene Rayfield HERO, MD  ibuprofen  (MOTRIN  IB) 200 MG tablet Take 2 tablets (400 mg total) by mouth 3 (three) times daily. 12/07/22 12/07/23  Maryjane Mt, MD  insulin  glargine (LANTUS ) 100 unit/mL SOPN Inject 30 Units into the skin daily. 11/05/22   Dwan Aldo HERO, PA-C  JARDIANCE  25 MG TABS tablet Take 25 mg by mouth daily. 06/06/20   [provider]  Lancets JANETT ULTRASOFT) lancets Check sugar twice daily 01/11/16   Smith, Kristi M, MD  lidocaine  (LIDODERM ) 5 % Place 1 patch onto the skin daily. Remove & Discard patch within 12 hours or as directed by MD. May place on right or left side of chest Patient taking differently: Place 1 patch onto the skin daily as needed (pain). Remove & Discard patch within 12 hours or as directed by MD. May place on right  or left side of chest 11/05/22   Dwan Aldo HERO, PA-C  metFORMIN  (GLUCOPHAGE ) 1000 MG tablet Take 1 tablet (1,000 mg total) by mouth 2 (two) times daily with a meal. 12/09/17   Claudene Rayfield HERO, MD  metoprolol  tartrate (LOPRESSOR ) 25 MG tablet Take 0.5 tablets (12.5 mg total) by mouth 2 (two) times daily. 02/25/23   Darron Deatrice LABOR, MD  traZODone  (DESYREL ) 50 MG tablet Take 50 mg by mouth at bedtime. 08/19/21   [provider]     Allergies  Patient has no known allergies.  Family History   Family History  Problem Relation Age of Onset   Diabetes Mother    Asthma Mother    Heart disease Father    Heart attack Father    Sjogren's syndrome Sister    Heart disease Brother 35       CAD/CABG     Physical Exam  Triage Vital Signs: ED Triage Vitals [05/27/23 2033]  Encounter Vitals Group     BP 117/89     Systolic BP Percentile      Diastolic BP Percentile      Pulse Rate (!) 101     Resp 18     Temp 98.5 F (36.9 C)     Temp src      SpO2 100 %     Weight      Height      Head Circumference      Peak Flow      Pain Score      Pain Loc      Pain Education      Exclude from Growth Chart     Updated Vital Signs: BP 117/89   Pulse (!) 101   Temp 98.5 F (36.9 C)   Resp 18   SpO2 100%    General: Awake, mild distress.  CV:  RRR.  Good peripheral perfusion.  Resp:  Normal effort.  CTAB. Abd:  Nontender.  No distention.  Other:  RLE swelling compared to the left.  Leg is warm and erythematous.  Healed laceration to bottom of right fifth digit.  Digit is swollen, erythematous with blistering on the dorsal side.  Palpable distal pulses.  Brisk, less than 5-second capillary refill.   ED Results / Procedures / Treatments  Labs (all labs ordered are listed, but only abnormal results are displayed) Labs Reviewed  BASIC METABOLIC PANEL - Abnormal; Notable for the following components:      Result Value   Glucose, Bld 139 (*)    All other components  within normal limits  CULTURE, BLOOD (ROUTINE X 2)  CULTURE, BLOOD (ROUTINE X 2)  CBC  LACTIC ACID, PLASMA  TROPONIN I (HIGH SENSITIVITY)     EKG  ED ECG REPORT I, Elijio Staples J, the attending physician, personally viewed and interpreted this ECG.   Date: 05/27/2023  EKG Time: 0125  Rate: 93  Rhythm: normal sinus rhythm  Axis: Normal  Intervals:none  ST&T Change: Nonspecific    RADIOLOGY I have independently visualized and interpreted patient's imaging studies as well as noted the radiology interpretation:  Foot x-ray: Acute displaced interarticular fracture of medial base of distal phalanx of fifth toe  Ultrasound: No DVT  CTA Chest: No PE  Official radiology report(s): US  Venous Img Lower Unilateral Right (DVT) Result Date: 05/28/2023 CLINICAL DATA:  Right leg swelling EXAM: RIGHT LOWER EXTREMITY VENOUS DOPPLER ULTRASOUND TECHNIQUE: Gray-scale sonography with compression, as well as color and duplex ultrasound, were performed to evaluate the deep venous system(s) from the level of the common femoral vein through the popliteal and proximal calf veins. COMPARISON:  None Available. FINDINGS: VENOUS Normal compressibility of the common femoral, superficial femoral, and popliteal veins, as well as the visualized calf veins. Visualized portions of profunda femoral vein and great saphenous vein unremarkable. No filling defects to suggest DVT on grayscale or color Doppler imaging. Doppler waveforms show normal direction of venous flow, normal respiratory plasticity and response to augmentation. Limited views of the contralateral common femoral vein are unremarkable. OTHER None. Limitations: none IMPRESSION: Negative.  Electronically Signed   By: Franky Crease M.D.   On: 05/28/2023 02:16   CT Angio Chest PE W/Cm &/Or Wo Cm Result Date: 05/28/2023 CLINICAL DATA:  Pulmonary embolism (PE) suspected, low to intermediate prob, neg D-dimer. Shortness of breath EXAM: CT ANGIOGRAPHY CHEST WITH  CONTRAST TECHNIQUE: Multidetector CT imaging of the chest was performed using the standard protocol during bolus administration of intravenous contrast. Multiplanar CT image reconstructions and MIPs were obtained to evaluate the vascular anatomy. RADIATION DOSE REDUCTION: This exam was performed according to the departmental dose-optimization program which includes automated exposure control, adjustment of the mA and/or kV according to patient size and/or use of iterative reconstruction technique. CONTRAST:  OMNIPAQUE  IOHEXOL  350 MG/ML SOLN COMPARISON:  11/11/2022 FINDINGS: Cardiovascular: No filling defects in the pulmonary arteries to suggest pulmonary emboli. Heart is normal size. Aorta is normal caliber. Prior aortic valve repair. Left anterior descending coronary stent noted. Scattered aortic atherosclerosis. Mediastinum/Nodes: No mediastinal, hilar, or axillary adenopathy. Trachea and esophagus are unremarkable. Thyroid unremarkable. Lungs/Pleura: Lungs are clear. No focal airspace opacities or suspicious nodules. No effusions. Upper Abdomen: No acute findings Musculoskeletal: Chest wall soft tissues are unremarkable. No acute bony abnormality. Review of the MIP images confirms the above findings. IMPRESSION: No evidence of pulmonary embolus. No acute cardiopulmonary disease. Coronary artery disease. Aortic Atherosclerosis (ICD10-I70.0). Electronically Signed   By: Franky Crease M.D.   On: 05/28/2023 01:11   DG Foot Complete Right Result Date: 05/27/2023 CLINICAL DATA:  Patient fell down the stairs 1 week ago and injured right pinky toe. Pain and redness and swelling in the right foot and lower leg EXAM: RIGHT FOOT COMPLETE - 3+ VIEW COMPARISON:  None Available. FINDINGS: Acute displaced intra-articular fracture of the medial base of the distal phalanx of the fifth toe. Marked soft tissue swelling about the dorsum of the foot and fifth toe. Plantar and Achilles calcaneal spurs. IMPRESSION: Acute  displaced intra-articular fracture of the medial base of the distal phalanx of the fifth toe. Diffuse soft tissue swelling about the dorsum of the foot and fifth toe. Electronically Signed   By: Norman Gatlin M.D.   On: 05/27/2023 23:42     PROCEDURES:  Critical Care performed: No  .1-3 Lead EKG Interpretation  Performed by: Robinette Vermell PARAS, MD Authorized by: Robinette Vermell PARAS, MD     Interpretation: normal     ECG rate:  95   ECG rate assessment: normal     Rhythm: sinus rhythm     Ectopy: none     Conduction: normal   Comments:     Patient placed on cardiac monitor to evaluate for arrthymias    MEDICATIONS ORDERED IN ED: Medications  doxycycline  (VIBRAMYCIN ) 100 mg in dextrose  5 % 250 mL IVPB (has no administration in time range)  sodium chloride  0.9 % bolus 1,000 mL (1,000 mLs Intravenous New Bag/Given 05/28/23 0123)  morphine  (PF) 4 MG/ML injection 4 mg (4 mg Intravenous Given 05/28/23 0121)  ondansetron  (ZOFRAN ) injection 4 mg (4 mg Intravenous Given 05/28/23 0121)  piperacillin -tazobactam (ZOSYN ) IVPB 3.375 g (3.375 g Intravenous New Bag/Given 05/28/23 0130)  iohexol  (OMNIPAQUE ) 350 MG/ML injection 100 mL (100 mLs Intravenous Contrast Given 05/28/23 0058)     IMPRESSION / MDM / ASSESSMENT AND PLAN / ED COURSE  I reviewed the triage vital signs and the nursing notes.  58 year old male presenting with RLE redness and swelling; occasional shortness of breath. Differential diagnosis includes but is not limited to cellulitis, abscess, DVT, PE, etc.  Personally reviewed patient's records and note a PCP office visit for type 2 diabetes maintenance on 02/19/2023.  Patient's presentation is most consistent with acute complicated illness / injury requiring diagnostic workup.  The patient is on the cardiac monitor to evaluate for evidence of arrhythmia and/or significant heart rate changes.  Laboratory results demonstrate normal WBC 10.5, unremarkable  electrolytes.  Fifth phalanx fracture seen on x-ray.  Will obtain sepsis workup, check troponin, EKG, DVT ultrasound, CTA chest to evaluate for PE.  Start IV fluid hydration, IV morphine  for pain, broad-spectrum IV antibiotic coverage to include MRSA coverage.  Will reassess.  Clinical Course as of 05/28/23 0229  Fri May 28, 2023  0130 Lactic acid negative.  CTA chest negative for PE.  Patient currently in ultrasound. [JS]  0220 US  negative for DVT.  Given patient is afebrile with negative sepsis workup, will discharge home to finish Keflex  and add doxycycline  for MRSA coverage.  Will prescribe Percocet to use as needed.  Will bandage and buddy tape toes, provide podiatric shoe and podiatry as well as PCP follow-up.  Strict return precautions given.  Patient and spouse verbalized understanding and agree with plan of care. [JS]    Clinical Course User Index [JS] Robinette Vermell PARAS, MD     FINAL CLINICAL IMPRESSION(S) / ED DIAGNOSES   Final diagnoses:  Cellulitis of right lower extremity  Closed displaced fracture of phalanx of lesser toe of right foot, unspecified phalanx, initial encounter     Rx / DC Orders   ED Discharge Orders     None        Note:  This document was prepared using Dragon voice recognition software and may include unintentional dictation errors.   Coyle Stordahl J, MD 05/28/23 [phone]

## 2023-05-28 ENCOUNTER — Emergency Department: Payer: BLUE CROSS/BLUE SHIELD

## 2023-05-28 LAB — TROPONIN I (HIGH SENSITIVITY): Troponin I (High Sensitivity): 3 ng/L (ref <18)

## 2023-05-28 LAB — LACTIC ACID, PLASMA: Lactic Acid, Venous: 1.6 mmol/L (ref 0.5–1.9)

## 2023-05-28 MED ORDER — DOXYCYCLINE HYCLATE 50 MG PO CAPS: 100.0000 mg | ORAL_CAPSULE | Freq: Two times a day (BID) | ORAL | 0 refills | Status: DC

## 2023-05-28 MED ORDER — OXYCODONE-ACETAMINOPHEN 5-325 MG PO TABS: 1.0000 | ORAL_TABLET | ORAL | 0 refills | Status: AC | PRN

## 2023-05-28 MED ORDER — IOHEXOL 350 MG/ML SOLN
100.0000 mL | Freq: Once | INTRAVENOUS | Status: AC | PRN
  Administered 2023-05-28: 100 mL via INTRAVENOUS

## 2023-05-28 MED ORDER — OXYCODONE-ACETAMINOPHEN 5-325 MG PO TABS
1.0000 | ORAL_TABLET | Freq: Once | ORAL | Status: AC
Start: 2023-05-28 — End: 2023-05-28
  Administered 2023-05-28: 1 via ORAL
  Filled 2023-05-28: qty 1

## 2023-05-28 NOTE — Discharge Instructions (Signed)
 Take and finish the antibiotic you are already taking.  In addition, add Doxycycline  100mg  twice daily until finished.  You may take ibuprofen  as needed for pain, Percocet as needed for more severe pain.  Buddy tape your right fourth and fifth toes together, wear podiatric shoe as needed for comfort.  Return to the ER for worsening symptoms, persistent vomiting, fever, increased redness/swelling or other concerns.

## 2023-05-28 NOTE — ED Notes (Signed)
 Right fifth toe buddy taped and post op shoe applied

## 2023-06-02 LAB — CULTURE, BLOOD (ROUTINE X 2)
Culture: NO GROWTH
Culture: NO GROWTH

## 2023-06-16 ENCOUNTER — Other Ambulatory Visit
Admission: RE | Admit: 2023-06-16 | Discharge: 2023-06-16 | Disposition: A | Discharge status: Home / Self Care | Payer: BLUE CROSS/BLUE SHIELD | Source: Ambulatory Visit | Attending: Podiatry | Admitting: Podiatry

## 2023-06-16 DIAGNOSIS — S91104A Unspecified open wound of right lesser toe(s) without damage to nail, initial encounter: Secondary | ICD-10-CM | POA: Insufficient documentation

## 2023-06-21 LAB — AEROBIC/ANAEROBIC CULTURE W GRAM STAIN (SURGICAL/DEEP WOUND): Gram Stain: NONE SEEN

## 2023-07-04 ENCOUNTER — Emergency Department: Payer: BLUE CROSS/BLUE SHIELD

## 2023-07-04 ENCOUNTER — Emergency Department
Admission: EM | Admit: 2023-07-04 | Discharge: 2023-07-04 | Disposition: A | Discharge status: Home / Self Care | Payer: BLUE CROSS/BLUE SHIELD | Attending: Emergency Medicine | Admitting: Emergency Medicine

## 2023-07-04 ENCOUNTER — Encounter: Payer: Self-pay | Admitting: Emergency Medicine

## 2023-07-04 ENCOUNTER — Other Ambulatory Visit: Payer: Self-pay

## 2023-07-04 DIAGNOSIS — R079 Chest pain, unspecified: Secondary | ICD-10-CM | POA: Diagnosis not present

## 2023-07-04 DIAGNOSIS — R21 Rash and other nonspecific skin eruption: Secondary | ICD-10-CM

## 2023-07-04 DIAGNOSIS — T7840XA Allergy, unspecified, initial encounter: Secondary | ICD-10-CM | POA: Diagnosis not present

## 2023-07-04 LAB — BASIC METABOLIC PANEL
Anion gap: 13 (ref 5–15)
BUN: 16 mg/dL (ref 6–20)
CO2: 23 mmol/L (ref 22–32)
Calcium: 9.4 mg/dL (ref 8.9–10.3)
Chloride: 101 mmol/L (ref 98–111)
Creatinine, Ser: 1.2 mg/dL (ref 0.61–1.24)
GFR, Estimated: >60 mL/min (ref >60)
Glucose, Bld: 233 mg/dL — ABNORMAL HIGH (ref 70–99)
Potassium: 4.2 mmol/L (ref 3.5–5.1)
Sodium: 137 mmol/L (ref 135–145)

## 2023-07-04 LAB — CBC
HCT: 42.3 % (ref 39.0–52.0)
Hemoglobin: 13.9 g/dL (ref 13.0–17.0)
MCH: 27.1 pg (ref 26.0–34.0)
MCHC: 32.9 g/dL (ref 30.0–36.0)
MCV: 82.5 fL (ref 80.0–100.0)
Platelets: 239 10*3/uL (ref 150–400)
RBC: 5.13 MIL/uL (ref 4.22–5.81)
RDW: 15.1 % (ref 11.5–15.5)
WBC: 8 10*3/uL (ref 4.0–10.5)
nRBC: 0 % (ref 0.0–0.2)

## 2023-07-04 LAB — TROPONIN I (HIGH SENSITIVITY): Troponin I (High Sensitivity): 6 ng/L (ref <18)

## 2023-07-04 LAB — D-DIMER, QUANTITATIVE: D-Dimer, Quant: 0.69 ug{FEU}/mL — ABNORMAL HIGH (ref 0.00–0.50)

## 2023-07-04 MED ORDER — DIPHENHYDRAMINE HCL 25 MG PO CAPS
25.0000 mg | ORAL_CAPSULE | Freq: Once | ORAL | Status: AC
  Administered 2023-07-04: 25 mg via ORAL
  Filled 2023-07-04: qty 1

## 2023-07-04 MED ORDER — PREDNISONE 20 MG PO TABS
60.0000 mg | ORAL_TABLET | Freq: Once | ORAL | Status: AC
  Administered 2023-07-04: 60 mg via ORAL
  Filled 2023-07-04: qty 3

## 2023-07-04 MED ORDER — PREDNISONE 10 MG PO TABS: 40.0000 mg | ORAL_TABLET | Freq: Every day | ORAL | 0 refills | Status: AC

## 2023-07-04 MED ORDER — IOHEXOL 350 MG/ML SOLN
100.0000 mL | Freq: Once | INTRAVENOUS | Status: AC | PRN
  Administered 2023-07-04: 100 mL via INTRAVENOUS

## 2023-07-04 MED ORDER — ALUM & MAG HYDROXIDE-SIMETH 200-200-20 MG/5ML PO SUSP
30.0000 mL | Freq: Once | ORAL | Status: AC
  Administered 2023-07-04: 30 mL via ORAL
  Filled 2023-07-04: qty 30

## 2023-07-04 MED ORDER — FAMOTIDINE 20 MG PO TABS
20.0000 mg | ORAL_TABLET | Freq: Once | ORAL | Status: AC
  Administered 2023-07-04: 20 mg via ORAL
  Filled 2023-07-04: qty 1

## 2023-07-04 NOTE — Discharge Instructions (Signed)
I have sent steroids to your pharmacy for you to take for the next 4 days starting tomorrow.  Please follow-up with your PCP for reevaluation.  Please return for any severe worsening breathing symptoms.

## 2023-07-04 NOTE — ED Provider Notes (Signed)
[facility] Provider Note    Event Date/Time   First MD Initiated Contact with Patient 07/04/23 1532     (approximate)   History   Shortness of Breath   HPI Brett Marshall is a 58 y.o. male presenting today for rash.  Patient states yesterday having onset of rash around his hands which then spread throughout his arms and his legs.  Notices a couple spots on his trunk as well.  He describes itchiness as well as mild pain symptoms to it.  Intermittent shortness of breath but no wheezing.  Mild left-sided chest pain which was present last night but largely resolved at this point.  Otherwise denies nausea, vomiting, abdominal pain.  Denies any prior allergic reactions.  No new detergents, cleaning supplies, close, or other fabrics that he has laid on.  Has used hydrocortisone with some benefit.  Currently on an antibiotic but has not recently been changed and no other new medications.     Physical Exam   Triage Vital Signs: ED Triage Vitals [07/04/23 1347]  Encounter Vitals Group     BP (!) 134/93     Systolic BP Percentile      Diastolic BP Percentile      Pulse Rate (!) 114     Resp 18     Temp 97.8 F (36.6 C)     Temp Source Oral     SpO2 98 %     Weight 291 lb (132 kg)     Height 5\' 8"  (1.727 m)     Head Circumference      Peak Flow      Pain Score 7     Pain Loc      Pain Education      Exclude from Growth Chart     Most recent vital signs: Vitals:   07/04/23 1715 07/04/23 1817  BP:  121/78  Pulse: 92 93  Resp: 18 16  Temp:  97.9 F (36.6 C)  SpO2: 100% 100%   Physical Exam: I have reviewed the vital signs and nursing notes. General: Awake, alert, no acute distress.  Nontoxic appearing. Head:  Atraumatic, normocephalic.   ENT:  EOM intact, PERRL. Oral mucosa is pink and moist with no lesions. Neck: Neck is supple with full range of motion, No meningeal signs. Cardiovascular:  RRR, No murmurs. Peripheral pulses palpable and equal  bilaterally. Respiratory:  Symmetrical chest wall expansion.  No rhonchi, rales, or wheezes.  Good air movement throughout.  No use of accessory muscles.   Musculoskeletal:  No cyanosis or edema. Moving extremities with full ROM Abdomen:  Soft, nontender, nondistended. Neuro:  GCS 15, moving all four extremities, interacting appropriately. Speech clear. Psych:  Calm, appropriate.   Skin: Eczematous rash scattered throughout upper extremities at the hands most prominently as well as on the lower extremities.  Less prominent on the trunk.   ED Results / Procedures / Treatments   Labs (all labs ordered are listed, but only abnormal results are displayed) Labs Reviewed  BASIC METABOLIC PANEL - Abnormal; Notable for the following components:      Result Value   Glucose, Bld 233 (*)    All other components within normal limits  D-DIMER, QUANTITATIVE - Abnormal; Notable for the following components:   D-Dimer, Quant 0.69 (*)    All other components within normal limits  CBC  TROPONIN I (HIGH SENSITIVITY)     EKG My EKG interpretation: Rate of 106, sinus tachycardia, normal axis,  normal intervals.  No acute ST elevations or depressions   RADIOLOGY Independently interpreted chest x-ray with no acute pathology.  CTA chest with no acute pathology or PE   PROCEDURES:  Critical Care performed: No  Procedures   MEDICATIONS ORDERED IN ED: Medications  alum & mag hydroxide-simeth (MAALOX/MYLANTA) 200-200-20 MG/5ML suspension 30 mL (has no administration in time range)  predniSONE (DELTASONE) tablet 60 mg (60 mg Oral Given 07/04/23 1608)  famotidine (PEPCID) tablet 20 mg (20 mg Oral Given 07/04/23 1608)  diphenhydrAMINE (BENADRYL) capsule 25 mg (25 mg Oral Given 07/04/23 1608)  iohexol (OMNIPAQUE) 350 MG/ML injection 100 mL (100 mLs Intravenous Contrast Given 07/04/23 1734)     IMPRESSION / MDM / ASSESSMENT AND PLAN / ED COURSE  I reviewed the triage vital signs and the nursing  notes.                              Differential diagnosis includes, but is not limited to, allergic reaction, delayed hypersensitivity, contact dermatitis, PE, ACS, reflux  Patient's presentation is most consistent with acute complicated illness / injury requiring diagnostic workup.  Patient is a 58 year old male presenting today for rash to bilateral upper and lower extremities which has progressed over the past day.  He has had intermittent shortness of breath as well.  No acute distress while laying in the bed.  Slight tachycardia on arrival but on recheck of vital signs was normalized.  Otherwise no tachypnea or hypoxia.  Laboratory workup with nonischemic EKG and negative troponin.  CBC and BMP unremarkable.  D-dimer was slightly elevated given chest pain symptoms with shortness of breath and tachycardia.  Follow-up CTA chest shows no PE or other acute intrathoracic pathology.  Patient treated for potential delayed allergic reaction with prednisone, Pepcid, and Benadryl.  No specific worsening of the rash here at that time.  Was also given Mylanta for reflux symptoms.  Will discharge on prednisone to attempt to treat rash and have him follow-up with his PCP for reevaluation in the next couple of days.  Given strict return precautions and agreeable with plan.  The patient is on the cardiac monitor to evaluate for evidence of arrhythmia and/or significant heart rate changes.     FINAL CLINICAL IMPRESSION(S) / ED DIAGNOSES   Final diagnoses:  Allergic reaction, initial encounter  Rash     Rx / DC Orders   ED Discharge Orders          Ordered    predniSONE (DELTASONE) 10 MG tablet  Daily        07/04/23 1822             Note:  This document was prepared using Dragon voice recognition software and may include unintentional dictation errors.   Janith Lima, MD 07/04/23 [id number]

## 2023-07-04 NOTE — ED Triage Notes (Signed)
Patient to ED via Pov for SOB x2 days. States also having dizziness when moving his head. Rash noted all over body- ongoing for "a few days". Speaking in full sentences without difficulty.

## 2023-07-04 NOTE — ED Notes (Signed)
Full body rash that started on hands since 3 days, dizziness since yesterday, SOB since yesterday. Lungs clear but diminished.

## 2023-07-19 ENCOUNTER — Emergency Department
Admission: EM | Admit: 2023-07-19 | Discharge: 2023-07-19 | Disposition: A | Discharge status: Home / Self Care | Attending: Emergency Medicine | Admitting: Emergency Medicine

## 2023-07-19 ENCOUNTER — Other Ambulatory Visit: Payer: Self-pay

## 2023-07-19 DIAGNOSIS — E119 Type 2 diabetes mellitus without complications: Secondary | ICD-10-CM | POA: Diagnosis not present

## 2023-07-19 DIAGNOSIS — R21 Rash and other nonspecific skin eruption: Secondary | ICD-10-CM | POA: Diagnosis present

## 2023-07-19 DIAGNOSIS — I10 Essential (primary) hypertension: Secondary | ICD-10-CM | POA: Insufficient documentation

## 2023-07-19 LAB — KOH PREP
KOH Prep: NONE SEEN
Special Requests: NORMAL

## 2023-07-19 MED ORDER — TRIAMCINOLONE ACETONIDE 0.1 % EX OINT: 1.0000 | TOPICAL_OINTMENT | Freq: Two times a day (BID) | CUTANEOUS | 1 refills | Status: AC

## 2023-07-19 MED ORDER — TRIAMCINOLONE ACETONIDE 0.1 % EX OINT: 1.0000 | TOPICAL_OINTMENT | Freq: Two times a day (BID) | CUTANEOUS | 1 refills | Status: DC

## 2023-07-19 MED ORDER — MICONAZOLE 2 % EX POWD: 1.0000 | Freq: Two times a day (BID) | CUTANEOUS | 1 refills | Status: AC | PRN

## 2023-07-19 NOTE — ED Provider Notes (Signed)
 [facility] Emergency Department Provider Note     Event Date/Time   First MD Initiated Contact with Patient 07/19/23 1423     (approximate)   History   Rash   HPI  Brett Marshall is a 58 y.o. male with a history of diabetes, hypertension, HLD, obesity, and anemia, returns to the ED with persistent pruritic red rash noted to the face, head, trunk and extremities.  Patient was initially evaluated here about a week and a half ago after presenting to his PCP.  He was started on a course of cortisone cream, which caused multiple burning.  He discontinued use of the medication, and noticed red with a rash from his dorsal knuckles to his entire hand, palms, and forearms.  He denies any associated fevers, but notes that his typical core body temp runs around 96 F.  Chart review reveals that the rash developed 3 days after patient used a new wax product for a board game.  He trialed okay OTC outpatient follow-up petroleum jelly, topical lotion, [employer] body wash, all without any significant change.  He endorses severely dry skin and ongoing cracking and peeling.  He is apparently scheduled to see a local dermatologist next week for initial evaluation of the same presentation.  Physical Exam   Triage Vital Signs: ED Triage Vitals  Encounter Vitals Group     BP 07/19/23 1315 126/88     Systolic BP Percentile --      Diastolic BP Percentile --      Pulse Rate 07/19/23 1315 100     Resp 07/19/23 1315 18     Temp 07/19/23 1315 98.2 F (36.8 C)     Temp Source 07/19/23 1315 Oral     SpO2 07/19/23 1315 94 %     Weight 07/19/23 1314 290 lb (131.5 kg)     Height 07/19/23 1314 5\' 8"  (1.727 m)     Head Circumference --      Peak Flow --      Pain Score 07/19/23 1314 10     Pain Loc --      Pain Education --      Exclude from Growth Chart --     Most recent vital signs: Vitals:   07/19/23 1315  BP: 126/88  Pulse: 100  Resp: 18  Temp: 98.2 F (36.8 C)   SpO2: 94%    General Awake, no distress. NAD HEENT NCAT. PERRL. EOMI. No rhinorrhea. Mucous membranes are mois CV:  Good peripheral perfusion. RRR RESP:  Normal effort. CTA ABD:  No distention.  SKIN:  Patient with a diffuse erythematous maculopapular rash that appears to be multiple subcentimeter ovoid round lesions.  The lesions appear scaly without evidence of purulence or weeping.  There is also some hypertrophic skin to the hands and palms that is now cracking and peeling.  Multiple fissures are noted throughout.  Bilateral elbows with scaly silver patches consistent with psoriasis.  Groin and showing denuded shiny, erythematous skin consistent with intertriginous yeast dermatitis.   ED Results / Procedures / Treatments   Labs (all labs ordered are listed, but only abnormal results are displayed) Labs Reviewed  KOH PREP     EKG   RADIOLOGY  No results found.   PROCEDURES:  Critical Care performed: No  Procedures   MEDICATIONS ORDERED IN ED: Medications - No data to display   IMPRESSION / MDM / ASSESSMENT AND PLAN / ED COURSE  I reviewed the triage  vital signs and the nursing notes.                              Differential diagnosis includes, but is not limited to, contact dermatitis, eczema exacerbation, yeast dermatitis, pityriasis rosea  Patient's presentation is most consistent with acute complicated illness / injury requiring diagnostic workup.  Patient's diagnosis is consistent with nonspecific rash.  Patient presentation is reassuring as it shows no evidence of angioedema, fever, or developing cellulitis.  KOH prep was negative for evidence of yeast etiology.  As such, patient will be treated with a one-to-one mix of Eucerin and triamcinolone ointment.  He is also encouraged to use miconazole powder to the intertriginous areas of the groin and chest. Patient will be discharged home with prescriptions for miconazole powder and triamcinolone ointment.  Patient is to follow up with dermatology as needed or otherwise directed. Patient is given ED precautions to return to the ED for any worsening or new symptoms.  FINAL CLINICAL IMPRESSION(S) / ED DIAGNOSES   Final diagnoses:  Rash and nonspecific skin eruption     Rx / DC Orders   ED Discharge Orders          Ordered    triamcinolone ointment (KENALOG) 0.1 %  2 times daily,   Status:  Discontinued       Note to Pharmacy: MIX 1:1 WITH EUCERIN   07/19/23 1547    Miconazole 2 % POWD  2 times daily PRN        07/19/23 1547    triamcinolone ointment (KENALOG) 0.1 %  2 times daily       Note to Pharmacy: MIX 1:1 WITH EUCERIN   07/19/23 1605             Note:  This document was prepared using Dragon voice recognition software and may include unintentional dictation errors.    Lissa Hoard, PA-C 07/19/23 1614    Merwyn Katos, MD 07/20/23 1500

## 2023-07-19 NOTE — ED Triage Notes (Signed)
 Patient states red burning rash all over body; recently seen for the same.

## 2023-08-27 ENCOUNTER — Emergency Department

## 2023-08-27 ENCOUNTER — Other Ambulatory Visit: Payer: Self-pay

## 2023-08-27 ENCOUNTER — Emergency Department
Admission: EM | Admit: 2023-08-27 | Discharge: 2023-08-27 | Disposition: A | Discharge status: Home / Self Care | Attending: Emergency Medicine | Admitting: Emergency Medicine

## 2023-08-27 DIAGNOSIS — I2510 Atherosclerotic heart disease of native coronary artery without angina pectoris: Secondary | ICD-10-CM | POA: Insufficient documentation

## 2023-08-27 DIAGNOSIS — R21 Rash and other nonspecific skin eruption: Secondary | ICD-10-CM | POA: Insufficient documentation

## 2023-08-27 DIAGNOSIS — R059 Cough, unspecified: Secondary | ICD-10-CM | POA: Insufficient documentation

## 2023-08-27 DIAGNOSIS — K5730 Diverticulosis of large intestine without perforation or abscess without bleeding: Secondary | ICD-10-CM | POA: Insufficient documentation

## 2023-08-27 DIAGNOSIS — E119 Type 2 diabetes mellitus without complications: Secondary | ICD-10-CM | POA: Insufficient documentation

## 2023-08-27 DIAGNOSIS — M791 Myalgia, unspecified site: Secondary | ICD-10-CM | POA: Insufficient documentation

## 2023-08-27 DIAGNOSIS — I110 Hypertensive heart disease with heart failure: Secondary | ICD-10-CM | POA: Insufficient documentation

## 2023-08-27 DIAGNOSIS — I5020 Unspecified systolic (congestive) heart failure: Secondary | ICD-10-CM | POA: Diagnosis not present

## 2023-08-27 DIAGNOSIS — R079 Chest pain, unspecified: Secondary | ICD-10-CM | POA: Diagnosis present

## 2023-08-27 DIAGNOSIS — R0981 Nasal congestion: Secondary | ICD-10-CM | POA: Insufficient documentation

## 2023-08-27 DIAGNOSIS — R6883 Chills (without fever): Secondary | ICD-10-CM | POA: Insufficient documentation

## 2023-08-27 DIAGNOSIS — R0789 Other chest pain: Secondary | ICD-10-CM

## 2023-08-27 LAB — COMPREHENSIVE METABOLIC PANEL WITH GFR
ALT: 13 U/L (ref 0–44)
AST: 14 U/L — ABNORMAL LOW (ref 15–41)
Albumin: 3.5 g/dL (ref 3.5–5.0)
Alkaline Phosphatase: 96 U/L (ref 38–126)
Anion gap: 10 (ref 5–15)
BUN: 17 mg/dL (ref 6–20)
CO2: 25 mmol/L (ref 22–32)
Calcium: 8.8 mg/dL — ABNORMAL LOW (ref 8.9–10.3)
Chloride: 104 mmol/L (ref 98–111)
Creatinine, Ser: 0.99 mg/dL (ref 0.61–1.24)
GFR, Estimated: >60 mL/min (ref >60)
Glucose, Bld: 179 mg/dL — ABNORMAL HIGH (ref 70–99)
Potassium: 3.9 mmol/L (ref 3.5–5.1)
Sodium: 139 mmol/L (ref 135–145)
Total Bilirubin: 0.5 mg/dL (ref 0.0–1.2)
Total Protein: 7.4 g/dL (ref 6.5–8.1)

## 2023-08-27 LAB — RESP PANEL BY RT-PCR (RSV, FLU A&B, COVID)  RVPGX2
Influenza A by PCR: NEGATIVE
Influenza B by PCR: NEGATIVE
Resp Syncytial Virus by PCR: NEGATIVE
SARS Coronavirus 2 by RT PCR: NEGATIVE

## 2023-08-27 LAB — CBC
HCT: 41.5 % (ref 39.0–52.0)
Hemoglobin: 13.8 g/dL (ref 13.0–17.0)
MCH: 28.3 pg (ref 26.0–34.0)
MCHC: 33.3 g/dL (ref 30.0–36.0)
MCV: 85 fL (ref 80.0–100.0)
Platelets: 195 10*3/uL (ref 150–400)
RBC: 4.88 MIL/uL (ref 4.22–5.81)
RDW: 14.4 % (ref 11.5–15.5)
WBC: 10.9 10*3/uL — ABNORMAL HIGH (ref 4.0–10.5)
nRBC: 0 % (ref 0.0–0.2)

## 2023-08-27 LAB — BRAIN NATRIURETIC PEPTIDE: B Natriuretic Peptide: 128.9 pg/mL — ABNORMAL HIGH (ref 0.0–100.0)

## 2023-08-27 LAB — TROPONIN I (HIGH SENSITIVITY)
Troponin I (High Sensitivity): 5 ng/L (ref <18)
Troponin I (High Sensitivity): 4 ng/L (ref <18)

## 2023-08-27 LAB — LIPASE, BLOOD: Lipase: 25 U/L (ref 11–51)

## 2023-08-27 LAB — PROCALCITONIN: Procalcitonin: <0.1 ng/mL

## 2023-08-27 LAB — D-DIMER, QUANTITATIVE: D-Dimer, Quant: 0.35 ug{FEU}/mL (ref 0.00–0.50)

## 2023-08-27 MED ORDER — LACTATED RINGERS IV BOLUS
1000.0000 mL | Freq: Once | INTRAVENOUS | Status: AC
  Administered 2023-08-27: 1000 mL via INTRAVENOUS

## 2023-08-27 MED ORDER — IOHEXOL 350 MG/ML SOLN
75.0000 mL | Freq: Once | INTRAVENOUS | Status: AC | PRN
  Administered 2023-08-27: 75 mL via INTRAVENOUS

## 2023-08-27 MED ORDER — NITROGLYCERIN 0.4 MG SL SUBL
0.4000 mg | SUBLINGUAL_TABLET | SUBLINGUAL | Status: AC
  Administered 2023-08-27: 0.4 mg via SUBLINGUAL
  Filled 2023-08-27: qty 1

## 2023-08-27 MED ORDER — IOHEXOL 350 MG/ML SOLN
100.0000 mL | Freq: Once | INTRAVENOUS | Status: AC | PRN
  Administered 2023-08-27: 100 mL via INTRAVENOUS

## 2023-08-27 NOTE — ED Triage Notes (Signed)
 Per EMT report, patient c/o sharp mid-sternal chest pain that radiates to left shoulder that approximately 45 minutes PTA. Patient also has a rash and is recovering from an URI.  Patient tookd 4 (81mg  ) ASA PTA and EMT placed 1 in of nitroglycerin paste which brought the chest pain down from 9/10 to 6/10.   Patient has a cardiac history.  130/80 80 pulse CO2 35 100% RA  12 Leads was normal per EMT.

## 2023-08-27 NOTE — ED Provider Notes (Signed)
 [facility] Provider Note    Event Date/Time   First MD Initiated Contact with Patient 08/27/23 1113     (approximate)   History   Chest Pain   HPI  Brett Marshall is a 58 y.o. 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  Body aches chills runny nose cough.  He did 2 home COVID test that were negative.  He did not seek physician as he reported his symptoms were mild and they did improved.  Today he was doing well when suddenly he was struck with a feeling of a sharp pain in his left chest.  It was in his left mid chest that radiated towards his left shoulder.  It is since gone away.  He took aspirin at home and EMS placed nitroglycerin on his chest.  He feels well now.  He is had no leg swelling.  He does relate that he has had a little bit of a lingering "congested" cough since he had the previous fevers chills etc. a week and a half ago but overall improving except for a ongoing nonproductive but lingering cough  He is also for 2 or 3 weeks now been experiencing a red scaly rash on his arms and legs.  He saw his dermatologist and they told him they thought it might be a mild drug allergy, and did a biopsy of it.  He is still awaiting reevaluation with them but the rash is not progressive  Physical Exam   Triage Vital Signs: ED Triage Vitals  Encounter Vitals Group     BP 08/27/23 1113 (!) 130/93     Systolic BP Percentile --      Diastolic BP Percentile --      Pulse Rate 08/27/23 1113 86     Resp --      Temp 08/27/23 1113 97.9 F (36.6 C)     Temp Source 08/27/23 1113 Oral     SpO2 08/27/23 1113 100 %     Weight 08/27/23 1114 285 lb (129.3 kg)     Height 08/27/23 1114 5\' 8"  (1.727 m)     Head Circumference --       Peak Flow --      Pain Score 08/27/23 1113 7     Pain Loc --      Pain Education --      Exclude from Growth Chart --     Most recent vital signs: Vitals:   08/27/23 1113 08/27/23 1405  BP: (!) 130/93 121/79  Pulse: 86 82  Resp:  (!) 27  Temp: 97.9 F (36.6 C)   SpO2: 100% 100%     General: Awake, no distress.  He is very pleasant in no distress no oxygen requirement CV:  Good peripheral perfusion.  Somewhat prominent S2 but no murmur.  Normal tones otherwise Resp:  Normal effort.  Clear bilateral.  Somewhat diminished lung sounds throughout likely secondary to body habitus.  There is no focal rales no wheezing.  He speaks in full clear sentences on room air at this time.   Abd:  No distention.  Soft nontender nondistended Other:  Trace bilateral lower extremity edema about the sock lines but no large or unilateral edema.   ED Results /  Procedures / Treatments   Labs (all labs ordered are listed, but only abnormal results are displayed) Labs Reviewed  CBC - Abnormal; Notable for the following components:      Result Value   WBC 10.9 (*)    All other components within normal limits  COMPREHENSIVE METABOLIC PANEL WITH GFR - Abnormal; Notable for the following components:   Glucose, Bld 179 (*)    Calcium 8.8 (*)    AST 14 (*)    All other components within normal limits  BRAIN NATRIURETIC PEPTIDE - Abnormal; Notable for the following components:   B Natriuretic Peptide 128.9 (*)    All other components within normal limits  RESP PANEL BY RT-PCR (RSV, FLU A&B, COVID)  RVPGX2  LIPASE, BLOOD  D-DIMER, QUANTITATIVE  PROCALCITONIN  TROPONIN I (HIGH SENSITIVITY)  TROPONIN I (HIGH SENSITIVITY)     EKG  Interpreted by me at 1120 heart rate 85 QRS 90 QTc 430 Normal sinus rhythm no evidence of acute ischemia.   RADIOLOGY  Chest x-ray interpreted by me as negative for acute     PROCEDURES:  Critical Care performed: No  Procedures   MEDICATIONS ORDERED  IN ED: Medications  nitroGLYCERIN (NITROSTAT) SL tablet 0.4 mg (0.4 mg Sublingual Given 08/27/23 1421)  iohexol (OMNIPAQUE) 350 MG/ML injection 75 mL (75 mLs Intravenous Contrast Given 08/27/23 1510)     IMPRESSION / MDM / ASSESSMENT AND PLAN / ED COURSE  I reviewed the triage vital signs and the nursing notes.                              Differential diagnosis includes, but is not limited to, ACS, aortic dissection, pulmonary embolism, cardiac tamponade, pneumothorax, pneumonia, pericarditis, myocarditis, GI-related causes including esophagitis/gastritis, and musculoskeletal chest wall pain.    No ripping tearing or moving pain or pain to the back that would suggest acute aortic pathology.  Reports a sharp discomfort that is now abated.  He does have a history of aortic valve repair.  He is currently resting comfortably without distress.  He does also relate a recent illness with frequent coughing and had preceding fevers and chills about a week ago but those have regressed.  Overall at this time very reassuring examination given his extensive cardiac history we will undergo cardiac testing, also check chest x-ray, evaluate for possible positivity against influenza, rule out pneumonia, pleural effusion, etc.  Deemed low risk for acute thromboembolism.  No recent hospitalizations no history of DVT no unilateral leg swelling etc.  Will send screening D-dimer   Patient's presentation is most consistent with acute complicated illness / injury requiring diagnostic workup.   The patient is on the cardiac monitor to evaluate for evidence of arrhythmia and/or significant heart rate changes.  Clinical Course as of 08/27/23 1525  Fri Aug 27, 2023  1239 D-dimer exclusionary for thromboembolism in this setting with low pretest probability [MQ]  1251 Reevaluated the patient.  Currently resting comfortably.  Asleep on room entry without distress.  Family now at the bedside as well.  Updated on test results  as of this point [MQ]  1444 Repeat EKG interpreted by me at 1420 heart rate 80 QRS 90 QTc 450 Normal sinus rhythm no evidence of acute ischemia. [MQ]  1445 Labs this point reassuring initial normal troponin.  Procalcitonin arguing against acute pulmonary infection.  Negative COVID and flu testing.  BNP very minimally elevated without findings to support pulmonary edema  by imaging to this point.  Clinically doubt volume overload [MQ]    Clinical Course User Index [MQ] Sharyn Creamer, MD   ----------------------------------------- 2:09 PM on 08/27/2023 ----------------------------------------- Patient reports a mild recurrence of a soft area of the described a sort of light soft sharpness in the left chest now.  He is in no acute distress fully alert and oriented initial troponin normal.  Repeat ECG being performed, at this point low suspicion for ACS with atypical symptoms.  Given history of aortic pathology and pain will perform CT angiography to evaluate for acute change in aneurysmal dilatation dissection etc. though clinically this seems relatively low risk.  Thus far no evidence of ACS.  No evidence of pneumonia procalcitonin is normal and his D-dimer exclusionary.  Negative flu and COVID testing.  No associate abdominal pain.  If second troponin normal and CT angiography of the chest without acute finding I would suspect likely more of a musculoskeletal type cause perhaps related to his recent coughing and presumed viral illness.  Plan as above including follow-up on repeat troponin and CT angiography assigned to Dr. Scotty Court    FINAL CLINICAL IMPRESSION(S) / ED DIAGNOSES   Final diagnoses:  Intermittent left-sided chest pain     Rx / DC Orders   ED Discharge Orders     None        Note:  This document was prepared using Dragon voice recognition software and may include unintentional dictation errors.   Sharyn Creamer, MD 08/27/23 1525

## 2023-08-27 NOTE — ED Provider Notes (Signed)
 Procedures  Clinical Course as of 08/27/23 1658  Fri Aug 27, 2023  1239 D-dimer exclusionary for thromboembolism in this setting with low pretest probability [MQ]  1251 Reevaluated the patient.  Currently resting comfortably.  Asleep on room entry without distress.  Family now at the bedside as well.  Updated on test results as of this point [MQ]  1444 Repeat EKG interpreted by me at 1420 heart rate 80 QRS 90 QTc 450 Normal sinus rhythm no evidence of acute ischemia. [MQ]  1445 Labs this point reassuring initial normal troponin.  Procalcitonin arguing against acute pulmonary infection.  Negative COVID and flu testing.  BNP very minimally elevated without findings to support pulmonary edema by imaging to this point.  Clinically doubt volume overload [MQ]    Clinical Course User Index [MQ] Sharyn Creamer, MD    ----------------------------------------- 4:58 PM on 08/27/2023 ----------------------------------------- CT completed, unfortunately contrast timing was not adequate to assess for dissection. D/w radiologist who confirms it will be okay to repeat the study with new contrast bolus. Pt informed and agrees.    ----------------------------------------- 7:46 PM on 08/27/2023 ----------------------------------------- Repeat CT angiogram negative for aneurysm or dissection, no acute findings, remains pain-free.  Stable for discharge.   Sharman Cheek, MD 08/27/23 [phone]

## 2023-08-27 NOTE — ED Notes (Signed)
 Pt CAOx4, breathing normally, and normal in color. Pt is sitting upright in the bed and does not appear to be in any distress at this time. Pt's wife with him at bedside.

## 2023-09-10 ENCOUNTER — Ambulatory Visit: Admitting: Nurse Practitioner

## 2023-11-25 IMAGING — CT CT ANGIO CHEST
2 of 7 series · 18 of 46 positions shown · IV contrast (APPLIED)
Comparison: 09/04/2021

CLINICAL DATA: CHF, abnormal laboratory evaluation, chest
tightness, short of breath

EXAM:
CT ANGIOGRAPHY CHEST WITH CONTRAST
TECHNIQUE: Multidetector CT imaging of the chest was performed using the
standard protocol during bolus administration of intravenous
contrast. Multiplanar CT image reconstructions and MIPs were
obtained to evaluate the vascular anatomy.

[Series 6: cor · coronal · 0.66mm/px · 3 of 177 slices shown]
[im 45/177  soft-tissue]
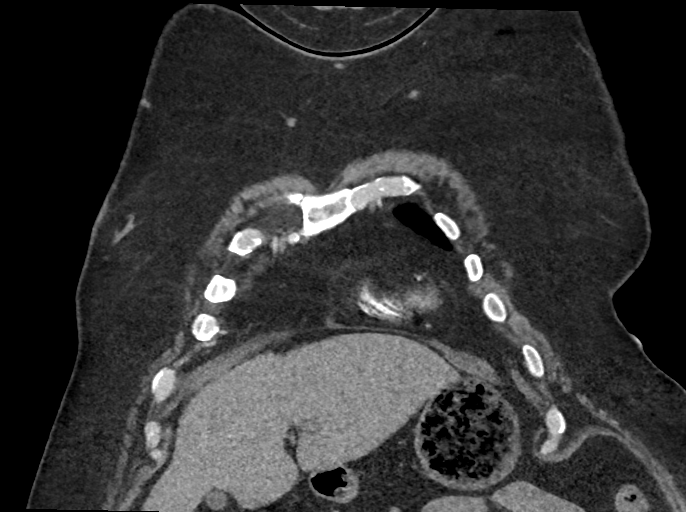
[im 89/177  soft-tissue]
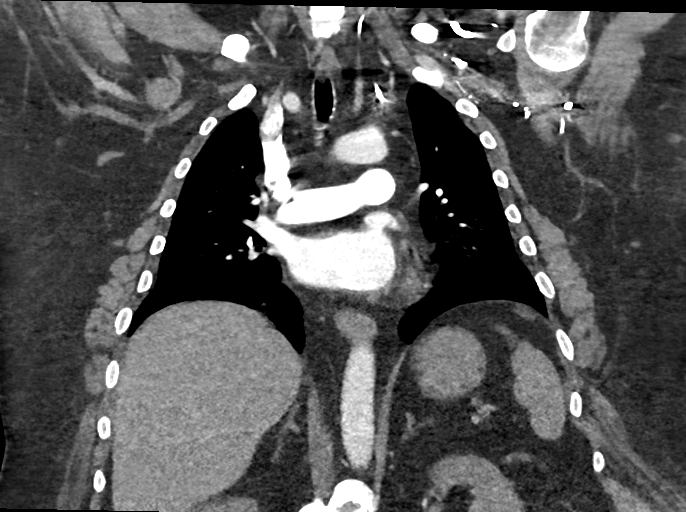
[im 133/177  soft-tissue]
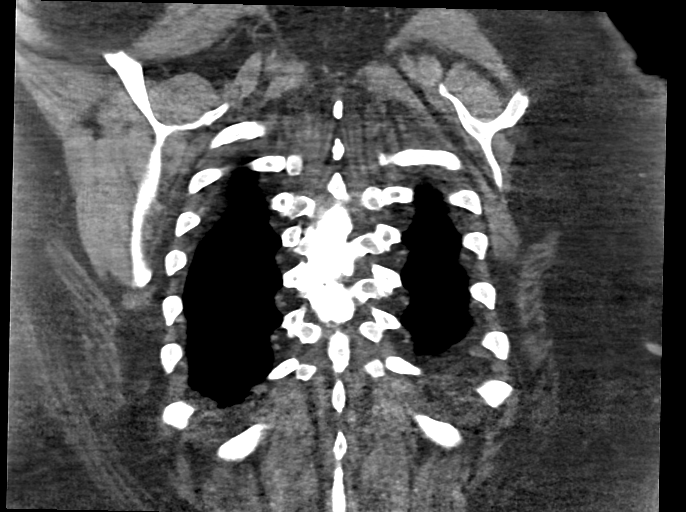

[Series 11: thins · axial · 0.83mm/px · z∈[-583,-287]mm · 15 of 477 slices shown]
[im 27/477  lung]
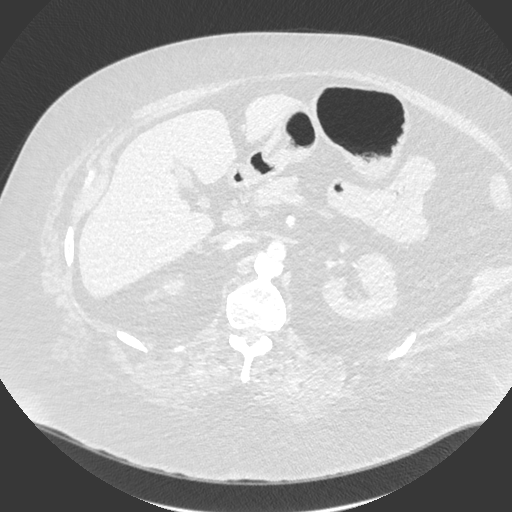
[im 53/477  soft-tissue]
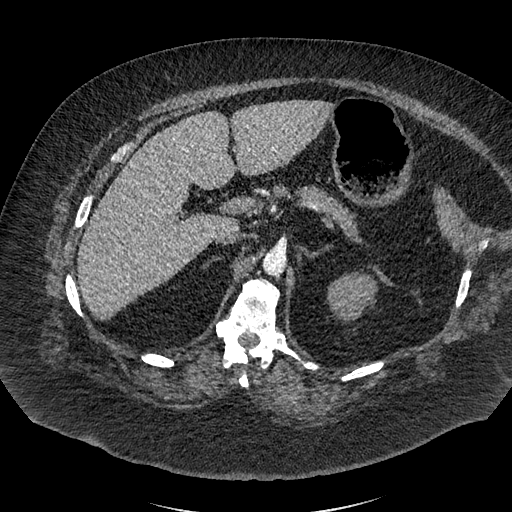
[im 80/477  lung]
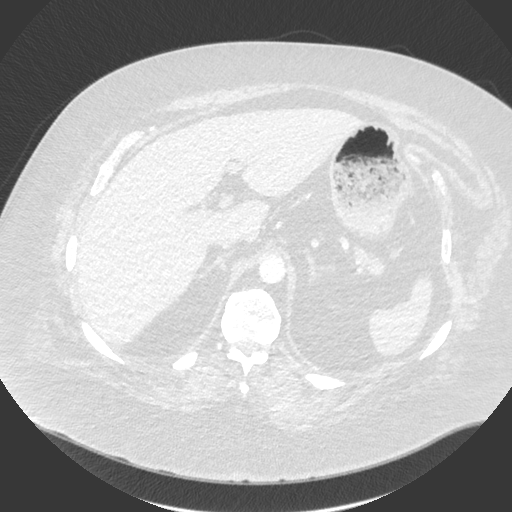
[im 106/477  soft-tissue]
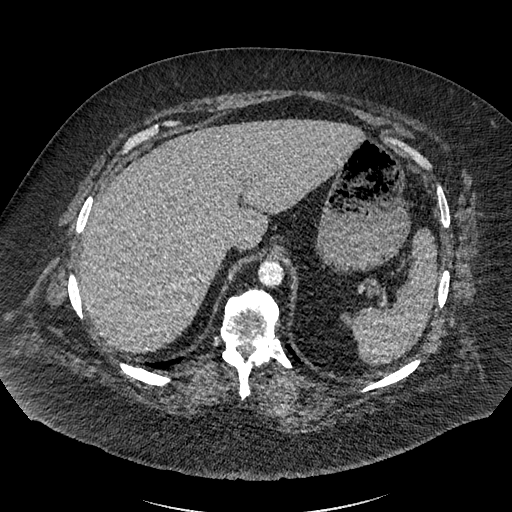
[im 159/477  lung]
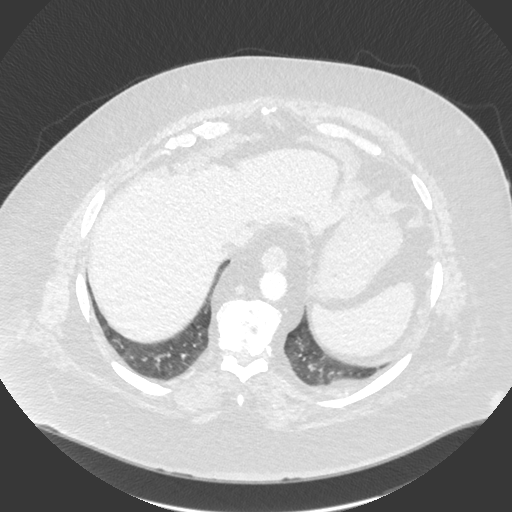
[im 186/477  soft-tissue]
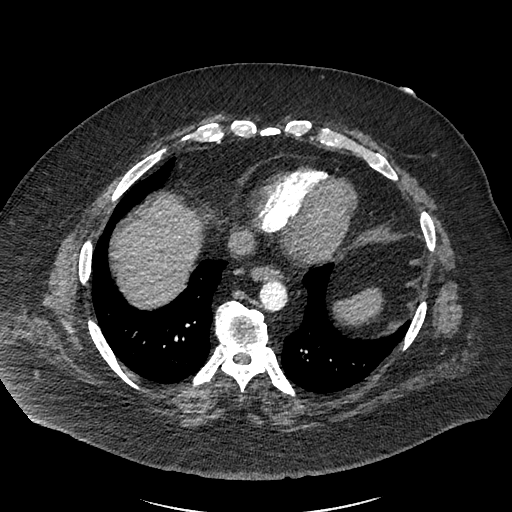
[im 212/477  lung]
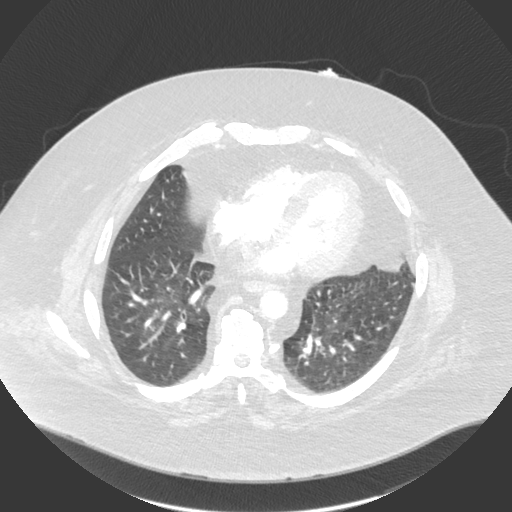
[im 239/477  soft-tissue]
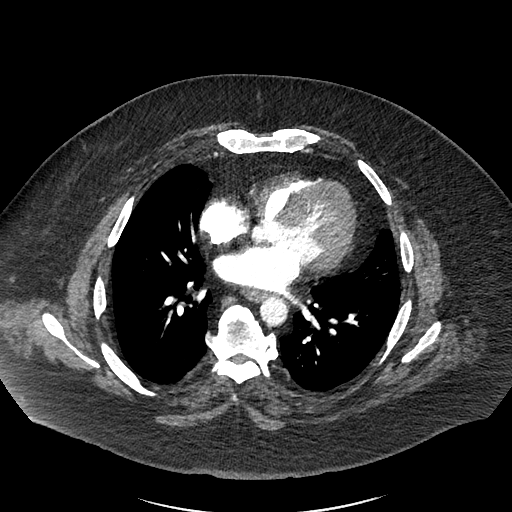
[im 265/477  lung]
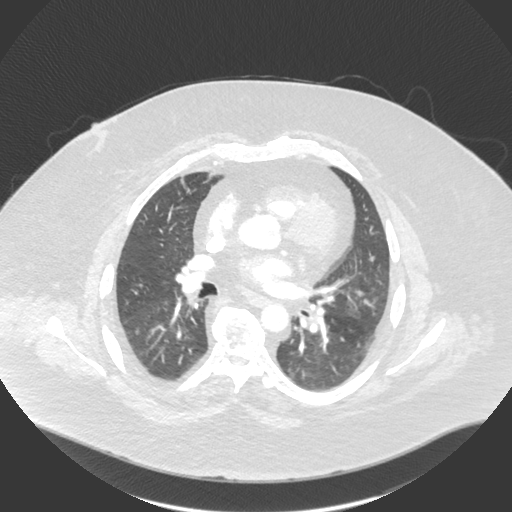
[im 291/477  soft-tissue]
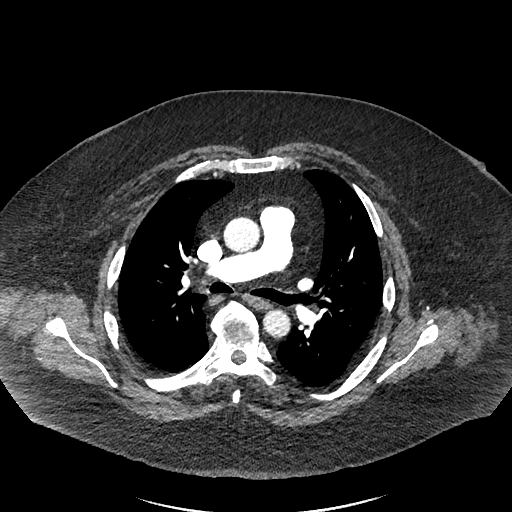
[im 318/477  lung]
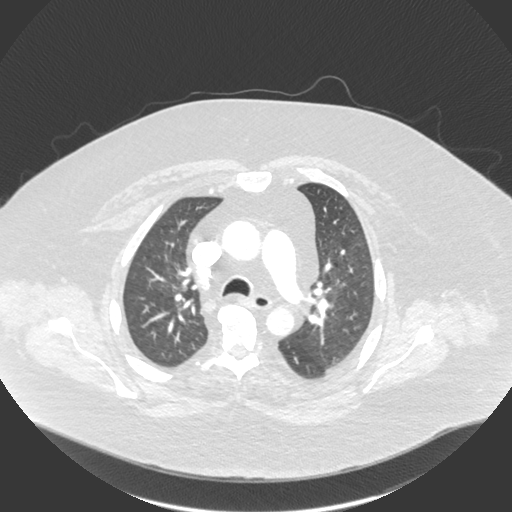
[im 371/477  soft-tissue]
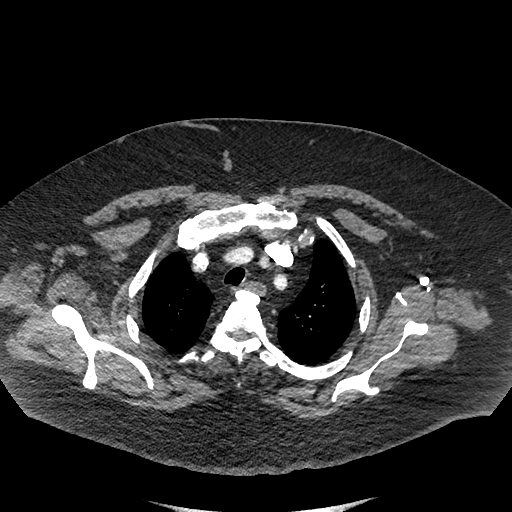
[im 397/477  lung]
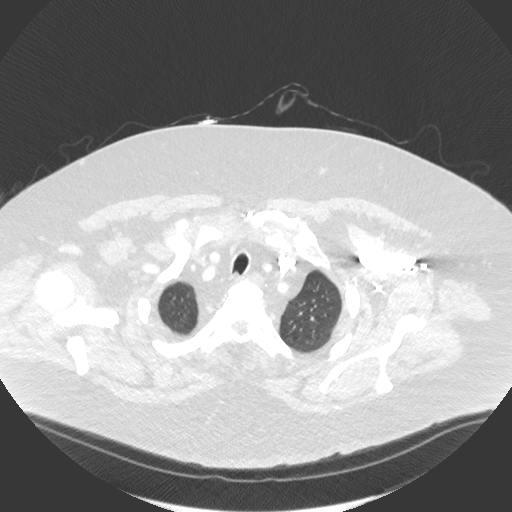
[im 424/477  soft-tissue]
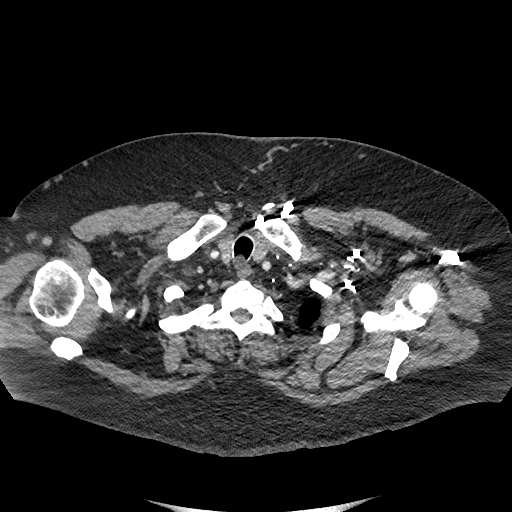
[im 450/477  lung]
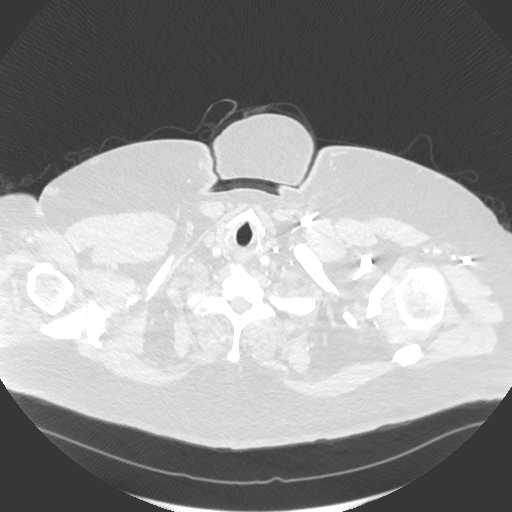

[18 of 46 positions shown; findings below may reference images not displayed]

RADIATION DOSE REDUCTION: This exam was performed according to the
departmental dose-optimization program which includes automated
exposure control, adjustment of the mA and/or kV according to
patient size and/or use of iterative reconstruction technique.

CONTRAST:  75mL OMNIPAQUE IOHEXOL 350 MG/ML SOLN
FINDINGS: Cardiovascular: This is a technically adequate evaluation of the
pulmonary vasculature. No filling defects or pulmonary emboli.

The heart is unremarkable without pericardial effusion. Mild
coronary artery atherosclerosis, with stent in the LAD distribution.
No evidence of thoracic aortic aneurysm or dissection. Mild
calcification of the aortic valve.

Mediastinum/Nodes: No enlarged mediastinal, hilar, or axillary lymph
nodes. Thyroid gland, trachea, and esophagus demonstrate no
significant findings.

Lungs/Pleura: No acute airspace disease, effusion, or pneumothorax.
Central airways are patent.

Upper Abdomen: No acute abnormality.

Musculoskeletal: No acute or destructive bony lesions. Reconstructed
images demonstrate no additional findings.

Review of the MIP images confirms the above findings.
IMPRESSION: 1. No evidence of pulmonary embolus.
2. No acute intrathoracic process.
3. Coronary artery atherosclerosis.

## 2024-01-06 ENCOUNTER — Encounter: Payer: Self-pay | Admitting: Cardiovascular Disease

## 2024-01-06 MED ORDER — METOPROLOL TARTRATE 25 MG PO TABS: 12.5000 mg | ORAL_TABLET | Freq: Two times a day (BID) | ORAL | 0 refills | Status: DC

## 2024-01-11 ENCOUNTER — Other Ambulatory Visit: Payer: Self-pay | Admitting: *Deleted

## 2024-01-11 MED ORDER — METOPROLOL TARTRATE 25 MG PO TABS: 12.5000 mg | ORAL_TABLET | Freq: Two times a day (BID) | ORAL | 0 refills | Status: DC

## 2024-01-12 NOTE — Progress Notes (Unsigned)
 Cardiology Office Note    Date:  01/13/2024   ID:  Brett Marshall, DOB [birth year], MRN [id number]  PCP:  Claudene Rayfield HERO, MD  Cardiologist:  Deatrice Cage, MD  Electrophysiologist:  None   Chief Complaint: Follow up   History of Present Illness:   Brett Marshall is a 58 y.o. male with history of CAD, chronic heart failure with depressed ejection fraction, mixed ischemic and nonischemic cardiomyopathy, prior left ventricular outflow track gradient, septic aortic membrane s/p resection, severe aortic stenosis s/p SAVR (bioprosthetic), obesity, hyperlipidemia, diabetes, and sleep apnea who presents for follow up on CAD and aortic disease.    Patient previously underwent diagnostic catheterization 08/2017 which showed mild nonobstructive CAD with EF 45 to 50%.  He had mildly elevated filling pressures and has since been treated for sleep apnea.  In 08/2021, he was admitted to [facility] with 10-day history of chest pain which was somewhat reproducible with palpation and exertion.  Troponins were normal.  Stress test showed anterior and apical ischemia.  Diagnostic catheterization showed 95% stenosis of the ostial LAD and otherwise nonobstructive disease.  LAD was successfully treated with DES.  Postprocedure, he had significant right wrist and forearm hematoma without any other vascular complications.  He had recurrent chest pain following discharge prompting relook catheterization which showed patent LAD stent and otherwise nonobstructive disease.  He was noted to have LVOT gradient of 50 to 80 mmHg at rest and 70 to 100 mmHg with provocation.  Subsequent cardiac MRI showed normal LV function with no evidence for hypertrophic cardiomyopathy or LVOT gradient.  Upon repeat evaluation of MRI in early 2024, it was felt that subaortic membrane may be present.  Repeat catheterization 08/2022 showed mild to moderate nonobstructive CAD without culprit for patient's ongoing chest pain and dyspnea.  Due to  progressive symptoms, he underwent another cardiac catheterization in 10/2022 which again showed nonobstructive disease.  A slow pullback was performed across the aortic valve which showed severe aortic stenosis with mean gradient of 31.5 mmHg calculated valve area of 1.05 cm (aortic index 0.42).  No significant LVOT gradient was noted during pullback.  He was subsequently seen by CT surgery in [facility] and underwent successful aortic valve replacement with a 23 mm bioprosthetic valve, along with resection of subaortic membrane and septal myectomy.  He was discharged home 11/02/2022 but readmitted 11/11/2022 due to sharp chest pain, dyspnea, and failure to thrive.  CT of the abdomen and pelvis negative.  CT angiogram of the chest showed 6 cm fluid collection presenting postoperative hematoma and bilateral pleural effusions.  No evidence of PE.  He was noted to be B12, iron , and vitamin D  deficient, and these were replaced.  AKI improved.  Follow-up limited echo showed normal LV function and normal functioning aortic valve prosthesis.  He received aggressive physical and occupational therapy with improvement in activity tolerance and improved dyspnea.  He was discharged home 11/22/2022.   Patient was most recently seen 01/15/2023 and overall doing much better from a cardiac perspective.  His furosemide  was changed to as needed.  No further testing or medication changes were indicated at that time.  Patient was seen the ED 08/27/2023 for sharp chest pain after a presumed viral illness. EKG, troponin, and CXR negative. CTA negative for aortic aneurysm or dissection. D dimer negative. Symptoms presumed to be musculoskeletal in the setting of recent coughing.  Patient presents today doing fairly well from a cardiac perspective. He reports one week of intermittent sharp chest  discomfort. This starts in the center of his chest and radiates towards his shoulder. This comes on predominately at rest when he is working at  his desk. He denies any exertional angina. He has stable mild dyspnea on exertion. He is compliant with his medications without adverse effect. He denies palpitations, lightheadedness, dizziness, and lower extremity swelling. Has not needed PRN Lasix  in a while. He also notes new rash on his central and upper left side of his chest. The rash is not painful but can be pruritic if he allows it to dry. His wife has been using antifungal cream on it. He is scheduled to see his PCP tomorrow for further evaluation.   Labs independently reviewed: 08/27/2023-sodium 139, potassium 3.9, BUN 17, creatinine 0.99, normal LFTs, Hgb 13.8, HCT 41.5, platelets 195 02/2023-TC 124, LDL 59, HDL 42, TG 113  Objective   Past Medical History:  Diagnosis Date   Asthma    CAD (coronary artery disease)    a. 08/2017 Cath: Diff nonobs dzs, EF 45-50%, diff HK; b. 08/2021 MV: Ant/apical ishemia; c. 08/2021 PCI: LAD 95ost (4.0x15 Onyx Frontier DES), EF 55-65%; c. 09/2021 Cath: LM nl, LAD patent stent, 63m, 30d, RI min irregs, LCX min irregs, RCA 50p. LVOT grad 50-62mmHg (rest), 70-115mmHg (provoked); d. 10/2022 Cath: LM nl, LAD 70m, 60d, RI min irregs, LCX min irregs, RCA 50p.   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 08/2017 LV gram: EF 45-50%, glob HK; b. 08/2021 Echo: EF 60-65%, no rwma, GrI DD, nl RV fxn; c. 09/2021 cMRI: EF 60%, no LVOT obstruction/gradient. Mild conc LVH, mild AS. No myocardial scar/fibrosis. No evidence of HCM.   Hyperlipidemia    Leg swelling    Left   Low back pain    Mixed ICM & NICM    a. 08/2017 LV gram: EF 45-50%, glob HK; b. 08/2021 Echo: EF 60-65%.   Morbid obesity (HCC)    OSA on CPAP    non compliant   Pulmonary hypertension (HCC)    Severe aortic stenosis    a. 10/2022  Cath: 31.78mmHg gradient and AVA 1.05cm^2; b. 10/2022 s/p 23mm Edwards Resilia bioprosthetic AoV, resection of sub-aortic membrane, and septal myectomy; c. 10/2022 Echo: EF 65-70%, no rwma, nl AoV structure/fxn.    Current Medications: Current Meds  Medication Sig   acetaminophen  (TYLENOL ) 325 MG tablet Take 650 mg by mouth every 6 (six) hours as needed for moderate pain.   albuterol  (VENTOLIN  HFA) 108 (90 Base) MCG/ACT inhaler Inhale 1-2 puffs into the lungs every 6 (six) hours as needed for wheezing or shortness of breath.   aspirin  EC 81 MG tablet Take 1 tablet (81 mg total) by mouth daily.   atorvastatin  (LIPITOR) 40 MG tablet Take 1 tablet (40 mg total) by mouth daily at 6 PM.   cholecalciferol  (CHOLECALCIFEROL ) 25 MCG tablet Take 1 tablet (1,000 Units total) by mouth daily.   cyanocobalamin  (VITAMIN B12) 1000 MCG tablet Take 1 tablet (1,000 mcg total) by mouth daily.   cyclobenzaprine  (FLEXERIL ) 10 MG tablet Take 1 tablet (10 mg total) by mouth 3 (three) times daily as needed.For spasms   DULoxetine  (CYMBALTA ) 60 MG capsule Take 120 mg by mouth at bedtime.  famotidine  (PEPCID ) 40 MG tablet Take 40 mg by mouth at bedtime.   ferrous sulfate  325 (65 FE) MG EC tablet Take 1 tablet (325 mg total) by mouth daily with breakfast.   fluticasone  (FLONASE) 50 MCG/ACT nasal spray Place 1 spray into both nostrils daily as needed for allergies or rhinitis.   fluticasone -salmeterol (ADVAIR) 250-50 MCG/ACT AEPB Inhale 1 puff into the lungs every 12 (twelve) hours.   furosemide  (LASIX ) 20 MG tablet Take 1 tablet (20 mg total) by mouth daily as needed.   gabapentin  (NEURONTIN ) 300 MG capsule Take 300 mg by mouth at bedtime.   glucose blood (GE100 BLOOD GLUCOSE TEST) test strip Use as instructed   insulin  glargine (LANTUS ) 100 unit/mL SOPN Inject 30 Units into the skin daily.   JARDIANCE  25 MG TABS tablet Take 25 mg by mouth daily.   Lancets (ONETOUCH ULTRASOFT) lancets Check sugar twice daily   metFORMIN   (GLUCOPHAGE ) 1000 MG tablet Take 1 tablet (1,000 mg total) by mouth 2 (two) times daily with a meal.   Miconazole  2 % POWD Apply 1 Application topically 2 (two) times daily as needed.   MOUNJARO 5 MG/0.5ML Pen Inject 5 mg into the skin once a week.   traZODone  (DESYREL ) 50 MG tablet Take 50 mg by mouth at bedtime.   triamcinolone  ointment (KENALOG ) 0.1 % Apply 1 Application topically 2 (two) times daily.   [DISCONTINUED] metoprolol  tartrate (LOPRESSOR ) 25 MG tablet Take 0.5 tablets (12.5 mg total) by mouth 2 (two) times daily. Please schedule appointment for future refills.    Allergies:   Patient has no known allergies.   Social History   Socioeconomic History   Marital status: Married    Spouse name: Not on file   Number of children: 0   Years of education: Not on file   Highest education level: Not on file  Occupational History   Occupation: Quarry manager    Employer: [employer]  Tobacco Use   Smoking status: Never   Smokeless tobacco: Never  Vaping Use   Vaping status: Never Used  Substance and Sexual Activity   Alcohol use: Not Currently   Drug use: No   Sexual activity: Yes  Other Topics Concern   Not on file  Social History Narrative   Goes to gym.   Social Drivers of Health   Financial Resource Strain: Medium Risk (06/16/2023)   Received [facility]   Overall Financial Resource Strain (CARDIA)    Difficulty of Paying Living Expenses: Somewhat hard  Food Insecurity: No Food Insecurity (06/16/2023)   Received from [facility]   Hunger Vital Sign    Within the past 12 months, you worried that your food would run out before you got the money to buy more.: Never true    Within the past 12 months, the food you bought just didn't last and you didn't have money to get more.: Never true  Transportation Needs: No Transportation Needs (06/16/2023)   Received from [facility] - Transportation    In the past  12 months, has lack of transportation kept you from medical appointments or from getting medications?: No    Lack of Transportation (Non-Medical): No  Physical Activity: Not on file  Stress: Not on file  Social Connections: Not on file     Family History:  The patient's family history includes Asthma in his mother; Diabetes in his mother; Heart attack in his father; Heart disease in his father;  Heart disease (age of onset: 13) in his brother; Sjogren's syndrome in his sister.  ROS:   12-point review of systems is negative unless otherwise noted in the HPI.  EKGs/Other Studies Reviewed:    Studies reviewed were summarized above. The additional studies were reviewed today:  11/14/2022 Echo limited 1. Limited study to assess EF and AVR.   2. Left ventricular ejection fraction, by estimation, is 65 to 70%. The  left ventricle has normal function. The left ventricle has no regional  wall motion abnormalities.   3. The aortic valve has been repaired/replaced. Aortic valve  regurgitation is not visualized. There is a 23 mm Edwards Inspiris Resilia  Bioprosthetic valve present in the aortic position. Echo findings are  consistent with normal structure and function  of the aortic valve prosthesis. Aortic valve area, by VTI measures 2.07  cm. Aortic valve mean gradient measures 14.0 mmHg. Aortic valve Vmax  measures 2.35 m/s. DI 0.47   10/2022 R/LHC 1.  Widely patent LAD stent with no significant restenosis.  The right coronary artery is known to be codominant and small in size with moderate proximal disease.  It was not injected. 2.  Left ventricular angiography was not performed.  EF was normal by echo. Right heart catheterization showed mildly elevated wedge pressure at 18 mmHg, mild pulmonary hypertension and normal cardiac output. 3.  Severe aortic stenosis with mean gradient of 31.5 mmHg and calculated valve area of 1.05 cm and aortic valve indexed area of 0.42.  No significant LVOT  gradient was noted with slow pullback.  EKG:  EKG personally reviewed by me today EKG Interpretation Date/Time:  Thursday January 13 2024 08:39:41 EDT Ventricular Rate:  93 PR Interval:  202 QRS Duration:  94 QT Interval:  340 QTC Calculation: 422 R Axis:   4  Text Interpretation: Normal sinus rhythm Normal ECG When compared with ECG of 27-Aug-2023 14:15, No significant change was found Confirmed by Lorene Sinclair ([id number]) on 01/13/2024 8:41:44 AM  PHYSICAL EXAM:    VS:  BP 100/78 (BP Location: Left Arm, Patient Position: Sitting, Cuff Size: Large)   Pulse 95   Ht 5' 8 (1.727 m)   Wt 284 lb 6 oz (129 kg)   SpO2 98%   BMI 43.24 kg/m   BMI: Body mass index is 43.24 kg/m.  GEN: Well nourished, well developed in no acute distress NECK: No JVD; No carotid bruits CARDIAC: RRR, no murmurs, rubs, gallops RESPIRATORY:  Clear to auscultation without rales, wheezing or rhonchi  ABDOMEN: Soft, non-tender, non-distended EXTREMITIES: No edema; No deformity SKIN: erythematous, shiny rash noted on central and upper left chest as well as under left breast  Wt Readings from Last 3 Encounters:  01/13/24 284 lb 6 oz (129 kg)  08/27/23 285 lb (129.3 kg)  07/19/23 290 lb (131.5 kg)       ASSESSMENT & PLAN:   Coronary artery disease Precordial pain - No symptoms of exertional angina or cardiac decompensation. Some intermittent atypical chest discomfort at rest similar to past episodes. Will update echo. Continue BB, ASA, and statin therapy.   Bicuspid aortic valve disease - S/p bioprosthetic aortic valve replacement and myomectomy with resection of subaortic membrane. Overall doing well with stable mild exertional dyspnea. Repeat echo as above. He is continued on Lasix  20 mg daily as needed.   Essential hypertension - BP well controlled. Continue metoprolol  tartrate 12.5 mg twice daily.   Hyperlipidemia - Most recent lipid panel 02/2023 with LDL 59, at goal. Continue  atorvastatin  40 mg  daily.   Morbid obesity - He is on Mounjaro per PCP.   Rash - Erythematous rash noted on central and upper left chest. Recommend further evaluation by PCP at upcoming appointment.    Disposition: Update echo. F/u with Dr. Darron or an APP in 4-6 weeks.   Medication Adjustments/Labs and Tests Ordered: Current medicines are reviewed at length with the patient today.  Concerns regarding medicines are outlined above. Medication changes, Labs and Tests ordered today are summarized above and listed in the Patient Instructions accessible in Encounters.   Bonney Lesley Maffucci, PA-C 01/13/2024 9:27 AM     [facility] [address] [place], [place] [address] [phone]

## 2024-01-13 ENCOUNTER — Ambulatory Visit: Attending: Nurse Practitioner | Admitting: Physician Assistant

## 2024-01-13 ENCOUNTER — Encounter: Payer: Self-pay | Admitting: Nurse Practitioner

## 2024-01-13 VITALS — BP 100/78 | HR 95 | Ht 68.0 in | Wt 284.4 lb

## 2024-01-13 DIAGNOSIS — I2510 Atherosclerotic heart disease of native coronary artery without angina pectoris: Secondary | ICD-10-CM

## 2024-01-13 DIAGNOSIS — I10 Essential (primary) hypertension: Secondary | ICD-10-CM | POA: Diagnosis not present

## 2024-01-13 DIAGNOSIS — E785 Hyperlipidemia, unspecified: Secondary | ICD-10-CM

## 2024-01-13 DIAGNOSIS — R072 Precordial pain: Secondary | ICD-10-CM

## 2024-01-13 DIAGNOSIS — I350 Nonrheumatic aortic (valve) stenosis: Secondary | ICD-10-CM | POA: Diagnosis not present

## 2024-01-13 MED ORDER — METOPROLOL TARTRATE 25 MG PO TABS: 12.5000 mg | ORAL_TABLET | Freq: Two times a day (BID) | ORAL | 3 refills | Status: AC

## 2024-01-13 NOTE — Patient Instructions (Addendum)
 Medication Instructions:  Your physician recommends that you continue on your current medications as directed. Please refer to the Current Medication list given to you today.    *If you need a refill on your cardiac medications before your next appointment, please call your pharmacy*  Lab Work: No labs ordered today    Testing/Procedures: Your physician has requested that you have an echocardiogram. Echocardiography is a painless test that uses sound waves to create images of your heart. It provides your doctor with information about the size and shape of your heart and how well your heart's chambers and valves are working.   You may receive an ultrasound enhancing agent through an IV if needed to better visualize your heart during the echo. This procedure takes approximately one hour.  There are no restrictions for this procedure.  This will take place at [address] [facility] [address], [place] [address]  Please note: We ask at that you not bring children with you during ultrasound (echo/ vascular) testing. Due to room size and safety concerns, children are not allowed in the ultrasound rooms during exams. Our front office staff cannot provide observation of children in our lobby area while testing is being conducted. An adult accompanying a patient to their appointment will only be allowed in the ultrasound room at the discretion of the ultrasound technician under special circumstances. We apologize for any inconvenience.   Follow-Up: At [facility], you and your health needs are our priority.  As part of our continuing mission to provide you with exceptional heart care, our providers are all part of one team.  This team includes your primary Cardiologist (physician) and Advanced Practice Providers or APPs (Physician Assistants and Nurse Practitioners) who all work together to provide you with the care you need, when you need it.  Your next appointment:   4-6  week(s)  Provider:   Deatrice Cage, MD or Lonni Meager, NP

## 2024-02-24 ENCOUNTER — Other Ambulatory Visit

## 2024-02-29 ENCOUNTER — Ambulatory Visit: Admitting: Nurse Practitioner

## 2024-04-03 ENCOUNTER — Ambulatory Visit: Attending: Physician Assistant

## 2024-04-03 DIAGNOSIS — R072 Precordial pain: Secondary | ICD-10-CM

## 2024-04-03 LAB — ECHOCARDIOGRAM COMPLETE
AR max vel: 2.2 cm2
AV Area VTI: 2.33 cm2
AV Area mean vel: 2.24 cm2
AV Mean grad: 3 mmHg
AV Peak grad: 5.5 mmHg
Ao pk vel: 1.17 m/s
Area-P 1/2: 4.17 cm2
Calc EF: 75.4 %
S' Lateral: 3.1 cm
Single Plane A2C EF: 74.2 %
Single Plane A4C EF: 74.9 %

## 2024-04-03 MED ORDER — PERFLUTREN LIPID MICROSPHERE
1.0000 mL | INTRAVENOUS | Status: AC | PRN
  Administered 2024-04-03: 1 mL via INTRAVENOUS

## 2024-04-04 ENCOUNTER — Ambulatory Visit: Payer: Self-pay | Admitting: Physician Assistant

## 2024-04-11 ENCOUNTER — Ambulatory Visit: Admitting: Nurse Practitioner

## 2024-04-20 ENCOUNTER — Encounter: Payer: Self-pay | Admitting: Nurse Practitioner

## 2024-04-20 ENCOUNTER — Ambulatory Visit: Attending: Nurse Practitioner | Admitting: Nurse Practitioner

## 2024-04-20 VITALS — BP 120/80 | HR 97 | Ht 68.0 in | Wt 275.4 lb

## 2024-04-20 DIAGNOSIS — I25119 Atherosclerotic heart disease of native coronary artery with unspecified angina pectoris: Secondary | ICD-10-CM | POA: Diagnosis not present

## 2024-04-20 DIAGNOSIS — I10 Essential (primary) hypertension: Secondary | ICD-10-CM

## 2024-04-20 DIAGNOSIS — I5020 Unspecified systolic (congestive) heart failure: Secondary | ICD-10-CM

## 2024-04-20 DIAGNOSIS — E119 Type 2 diabetes mellitus without complications: Secondary | ICD-10-CM

## 2024-04-20 DIAGNOSIS — I350 Nonrheumatic aortic (valve) stenosis: Secondary | ICD-10-CM | POA: Diagnosis not present

## 2024-04-20 DIAGNOSIS — R072 Precordial pain: Secondary | ICD-10-CM | POA: Diagnosis not present

## 2024-04-20 DIAGNOSIS — E785 Hyperlipidemia, unspecified: Secondary | ICD-10-CM

## 2024-04-20 DIAGNOSIS — Z952 Presence of prosthetic heart valve: Secondary | ICD-10-CM

## 2024-04-20 NOTE — Progress Notes (Signed)
 Office Visit    Patient Name: Brett Marshall Date of Encounter: 04/20/2024  Primary Care Provider:  Claudene Rayfield HERO, MD Primary Cardiologist:  Deatrice Cage, MD  Cardiology APP:  Vivienne Lonni Ingle, NP   Chief Complaint    58 y.o. 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  Past Medical History   Subjective   Past Medical History:  Diagnosis Date   Asthma    CAD (coronary artery disease)    a. 08/2017 Cath: Diff nonobs dzs, EF 45-50%, diff HK; b. 08/2021 MV: Ant/apical ishemia; c. 08/2021 PCI: LAD 95ost (4.0x15 Onyx Frontier DES), EF 55-65%; c. 09/2021 Cath: LM nl, LAD patent stent, 66m, 30d, RI min irregs, LCX min irregs, RCA 50p. LVOT grad 50-70mmHg (rest), 70-188mmHg (provoked); d. 10/2022 Cath: LM nl, LAD 65m, 60d, RI min irregs, LCX min irregs, RCA 50p.   DDD (degenerative disc disease), lumbar    Depression    Diabetes mellitus without complication (HCC)    Diarrhea    Essential hypertension, benign    Headache    HFimpEF (heart failure with improved ejection fraction) (HCC)    a. 08/2017 LV gram: EF 45-50%, glob HK; b. 08/2021 Echo: EF 60-65%, no rwma, GrI DD, nl RV fxn; c. 09/2021 cMRI: EF 60%, no LVOT obstruction/gradient. Mild conc LVH, mild AS. No myocardial scar/fibrosis. No evidence of HCM; c. 03/2024 Echo: EF 60-65%, mild LVH, triv MR, nl AoV fxn.   Hyperlipidemia    Leg swelling    Left   Low back pain    Mixed ICM & NICM    a. 08/2017 LV gram: EF 45-50%, glob HK; b. 08/2021 Echo: EF 60-65%.   Morbid obesity (HCC)    OSA on CPAP    non compliant   Pulmonary hypertension (HCC)    S/P aortic valve replacement with bioprosthetic valve    a. 10/2022 s/p 23mm  Edwards Resilia bioprosthetic AoV, resection of sub-aortic membrane, and septal myectomy   Severe aortic stenosis    a. 10/2022 Cath: 31.25mmHg gradient and AVA 1.05cm^2; b. 10/2022 s/p 23mm Edwards Resilia bioprosthetic AoV, resection of sub-aortic membrane, and septal myectomy; c. 10/2022 Echo: EF 65-70%, no rwma, nl AoV structure/fxn; c. 03/2024 Normal prosthetic AoV fxn, AVA 2.33cm^2 (VTI), mean grad .   Past Surgical History:  Procedure Laterality Date   AORTIC ROOT ENLARGEMENT N/A 10/30/2022   Procedure: AORTIC ROOT ENLARGEMENT AND SUBAORTIC MYOMECTOMY;  Surgeon: Maryjane Mt, MD;  Location: [facility] OR;  Service: Open Heart Surgery;  Laterality: N/A;   AORTIC VALVE REPLACEMENT N/A 10/30/2022   Procedure: AORTIC VALVE REPLACEMENT (AVR) USING INSPIRIS RESILIA AORTIC VALVE;  Surgeon: Maryjane Mt, MD;  Location: [facility] OR;  Service: Open Heart Surgery;  Laterality: N/A;  Median sternotomy   CARDIAC CATHETERIZATION     [place] no stents.   COLONOSCOPY WITH PROPOFOL  N/A 04/11/2019   Procedure: COLONOSCOPY WITH PROPOFOL ;  Surgeon: Jinny Carmine, MD;  Location: [facility] ENDOSCOPY;  Service: Endoscopy;  Laterality: N/A;   CORONARY STENT INTERVENTION N/A 09/08/2021   Procedure: CORONARY STENT INTERVENTION;  Surgeon: Cage Deatrice LABOR, MD;  Location: [facility] INVASIVE CV LAB;  Service: Cardiovascular;  Laterality: N/A;   CORONARY ULTRASOUND/IVUS N/A 09/08/2021   Procedure: Intravascular Ultrasound/IVUS;  Surgeon: Cage Deatrice LABOR, MD;  Location: [facility] INVASIVE CV LAB;  Service: Cardiovascular;  Laterality: N/A;   LEFT HEART CATH AND CORONARY ANGIOGRAPHY N/A 09/08/2021   Procedure: LEFT HEART CATH AND CORONARY ANGIOGRAPHY;  Surgeon: Darron Deatrice LABOR, MD;  Location: [facility] INVASIVE CV LAB;  Service: Cardiovascular;  Laterality: N/A;   LEFT HEART CATH AND CORONARY ANGIOGRAPHY N/A 09/17/2021   Procedure: LEFT HEART CATH AND CORONARY ANGIOGRAPHY;  Surgeon: Mady Bruckner, MD;  Location: [facility] INVASIVE CV LAB;   Service: Cardiovascular;  Laterality: N/A;   LEFT HEART CATH AND CORONARY ANGIOGRAPHY N/A 09/08/2022   Procedure: LEFT HEART CATH AND CORONARY ANGIOGRAPHY;  Surgeon: Mady Bruckner, MD;  Location: [facility] INVASIVE CV LAB;  Service: Cardiovascular;  Laterality: N/A;   RIGHT/LEFT HEART CATH AND CORONARY ANGIOGRAPHY N/A 09/02/2017   Procedure: RIGHT/LEFT HEART CATH AND CORONARY ANGIOGRAPHY;  Surgeon: Darron Deatrice LABOR, MD;  Location: [facility] INVASIVE CV LAB;  Service: Cardiovascular;  Laterality: N/A;   RIGHT/LEFT HEART CATH AND CORONARY ANGIOGRAPHY Bilateral 10/19/2022   Procedure: RIGHT/LEFT HEART CATH AND CORONARY ANGIOGRAPHY;  Surgeon: Darron Deatrice LABOR, MD;  Location: [facility] INVASIVE CV LAB;  Service: Cardiovascular;  Laterality: Bilateral;   TEE WITHOUT CARDIOVERSION N/A 09/10/2022   Procedure: TRANSESOPHAGEAL ECHOCARDIOGRAM;  Surgeon: Perla Evalene PARAS, MD;  Location: [facility] ORS;  Service: Cardiovascular;  Laterality: N/A;   TEE WITHOUT CARDIOVERSION N/A 10/30/2022   Procedure: TRANSESOPHAGEAL ECHOCARDIOGRAM;  Surgeon: Maryjane Mt, MD;  Location: [facility] OR;  Service: Open Heart Surgery;  Laterality: N/A;    Allergies  No Known Allergies     History of Present Illness      58 y.o. 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  He previously underwent diagnostic catheterization April 2019 which showed mild, nonobstructive CAD with an EF of 45 to 50%.  He had mildly elevated filling pressures and has since been treated for sleep apnea.  In April 2023, he was admitted to [facility] with a 10-day history of chest pain which was somewhat reproducible palpation and exertion.  Troponins were normal.  Stress test showed anterior and apical ischemia.  Diagnostic catheterization showed 95% stenosis of the ostial LAD and otherwise  nonobstructive disease.  The LAD was successfully treated with drug-eluting stent with improvement in exertional symptoms.  Postprocedure, he had a significant right wrist and forearm hematoma without any other vascular complications.  He had recurrent chest pain following discharge prompting relook catheterization, which showed patent LAD stent otherwise nonobstructive disease.  He was noted to have an LVOT gradient of 50-80 mmHg at rest and 70-100 mmHg with provocation.  Subsequent cardiac MRI showed normal LV function without evidence for hypertrophic cardiomyopathy or LVOT gradient.  In the setting of recurrent symptoms in early 2024, upon reevaluation of MRI, it was felt that a subaortic membrane may be present.  Repeat catheterization in April 2024 again showed mild to moderate nonobstructive CAD without culprit for patient's ongoing chest pain and dyspnea.  Due to progressive symptoms, he underwent another catheterization in June 2024 which again showed nonobstructive CAD.  A slow pullback was performed across the aortic valve showed severe attic stenosis with a mean gradient of 31.5 mmHg and calculated valve area of 1.05 cm (aortic index 0.42). no significant LVOT gradient was noted during pullback.  He was subsequently seen by CT surgery in [facility] and underwent successful aortic valve replacement with a 23 mm bioprosthetic valve, along  with resection of subaortic membrane, and septal myectomy.  He was discharged home November 02, 2022 but readmitted June 26th due to sharp chest pain, dyspnea, and failure to thrive.  CT of the abdomen and pelvis showed no acute pathology.  CT angiogram of the chest showed a 6 cm fluid collection representing postoperative hematoma and bilateral pleural effusions.  No evidence of pulmonary embolism was present.  He was noted to be B12, iron , and vitamin D  deficient, and these were replaced.  Acute kidney injury improved.  Follow-up limited echo showed normal LV function  and normal functioning aortic valve prosthesis.      Mr. Dillenburg was last seen in cardiology clinic in August 2025, at which time he reported a 1 week history of intermittent sharp chest discomfort that radiated to his shoulder.  Symptoms were felt to be atypical.  Follow-up echocardiogram in November 2025 showed an EF of 60 to 65% with normal functioning aortic valve prosthesis.  Unfortunately, Mr. Hardenbrook has continued to have intermittent sharp left chest pain that radiates to his shoulder occurring several times per day, typically at rest, and resolving spontaneously.  The sharp pain will last a few seconds but is then followed by a more lingering discomfort for several minutes.  There are no associated symptoms.  He cannot remember any times that he might of had this with exertion but the symptoms are reminiscent of what he experienced at the time of presentation in 2023.  He denies dyspnea on exertion, palpitations, PND, orthopnea, dizziness, syncope, edema, or early satiety.  He continues to lose weight and is down to 275 pounds (was 300 pounds in September 2024). Objective   Home Medications    Current Outpatient Medications  Medication Sig Dispense Refill   acetaminophen  (TYLENOL ) 325 MG tablet Take 650 mg by mouth every 6 (six) hours as needed for moderate pain.     albuterol  (VENTOLIN  HFA) 108 (90 Base) MCG/ACT inhaler Inhale 1-2 puffs into the lungs every 6 (six) hours as needed for wheezing or shortness of breath. 18 g 1   aspirin  EC 81 MG tablet Take 1 tablet (81 mg total) by mouth daily. 90 tablet 0   atorvastatin  (LIPITOR) 40 MG tablet Take 1 tablet (40 mg total) by mouth daily at 6 PM. 30 tablet 2   cyclobenzaprine  (FLEXERIL ) 10 MG tablet Take 1 tablet (10 mg total) by mouth 3 (three) times daily as needed.For spasms 90 tablet 5   DULoxetine  (CYMBALTA ) 60 MG capsule Take 120 mg by mouth at bedtime.     famotidine  (PEPCID ) 40 MG tablet Take 40 mg by mouth at bedtime.     ferrous  sulfate 325 (65 FE) MG EC tablet Take 1 tablet (325 mg total) by mouth daily with breakfast. 30 tablet 0   fluticasone  (FLONASE) 50 MCG/ACT nasal spray Place 1 spray into both nostrils daily as needed for allergies or rhinitis.     fluticasone -salmeterol (ADVAIR) 250-50 MCG/ACT AEPB Inhale 1 puff into the lungs every 12 (twelve) hours. 60 each 6   furosemide  (LASIX ) 20 MG tablet Take 1 tablet (20 mg total) by mouth daily as needed. 90 tablet 0   gabapentin  (NEURONTIN ) 300 MG capsule Take 300 mg by mouth at bedtime.     insulin  glargine (LANTUS ) 100 unit/mL SOPN Inject 30 Units into the skin daily. 15 mL 11   JARDIANCE  25 MG TABS tablet Take 25 mg by mouth daily.     metFORMIN  (GLUCOPHAGE ) 1000 MG tablet Take 1 tablet (  1,000 mg total) by mouth 2 (two) times daily with a meal. 180 tablet 1   metoprolol  tartrate (LOPRESSOR ) 25 MG tablet Take 0.5 tablets (12.5 mg total) by mouth 2 (two) times daily. 180 tablet 3   Miconazole  2 % POWD Apply 1 Application topically 2 (two) times daily as needed. 85 g 1   MOUNJARO 5 MG/0.5ML Pen Inject 5 mg into the skin once a week.     traZODone  (DESYREL ) 50 MG tablet Take 50 mg by mouth at bedtime.     glucose blood (GE100 BLOOD GLUCOSE TEST) test strip Use as instructed (Patient not taking: Reported on 04/20/2024) 100 each 12   Lancets (ONETOUCH ULTRASOFT) lancets Check sugar twice daily (Patient not taking: Reported on 04/20/2024) 100 each 12   lidocaine  (LIDODERM ) 5 % Place 1 patch onto the skin daily. Remove & Discard patch within 12 hours or as directed by MD. May place on right or left side of chest (Patient not taking: Reported on 04/20/2024) 14 patch 0   oxyCODONE -acetaminophen  (PERCOCET/ROXICET) 5-325 MG tablet Take 1 tablet by mouth every 4 (four) hours as needed for severe pain (pain score 7-10). (Patient not taking: Reported on 04/20/2024) 15 tablet 0   triamcinolone  ointment (KENALOG ) 0.1 % Apply 1 Application topically 2 (two) times daily. (Patient not taking:  Reported on 04/20/2024) 80 g 1   No current facility-administered medications for this visit.     Physical Exam    VS:  BP 120/80 (BP Location: Right Arm, Patient Position: Sitting, Cuff Size: Large)   Pulse 97   Ht 5' 8 (1.727 m)   Wt 275 lb 6 oz (124.9 kg)   SpO2 97%   BMI 41.87 kg/m  , BMI Body mass index is 41.87 kg/m.          GEN: Obese, in no acute distress. HEENT: normal. Neck: Supple, obese, difficult to gauge JVP.  No bruits or masses.   Cardiac: RRR, 2/6 systolic murmur at the upper sternal borders but heard throughout. No clubbing, cyanosis, edema.  Radials 2+/PT 2+ and equal bilaterally.  Respiratory:  Respirations regular and unlabored, clear to auscultation bilaterally. GI: Soft, nontender, nondistended, BS + x 4. MS: no deformity or atrophy. Skin: warm and dry, no rash. Neuro:  Strength and sensation are intact. Psych: Normal affect.  Accessory Clinical Findings    ECG personally reviewed by me today - EKG Interpretation Date/Time:  Thursday April 20 2024 09:35:23 EST Ventricular Rate:  97 PR Interval:  172 QRS Duration:  88 QT Interval:  342 QTC Calculation: 434 R Axis:   3  Text Interpretation: Normal sinus rhythm Left atrial enlargement Confirmed by Vivienne Bruckner [id number]) on 04/20/2024 9:39:05 AM   - no acute changes.  Lab Results  Component Value Date   WBC 10.9 (H) 08/27/2023   HGB 13.8 08/27/2023   HCT 41.5 08/27/2023   MCV 85.0 08/27/2023   PLT 195 08/27/2023   Lab Results  Component Value Date   CREATININE 0.99 08/27/2023   BUN 17 08/27/2023   NA 139 08/27/2023   K 3.9 08/27/2023   CL 104 08/27/2023   CO2 25 08/27/2023   Lab Results  Component Value Date   ALT 13 08/27/2023   AST 14 (L) 08/27/2023   ALKPHOS 96 08/27/2023   BILITOT 0.5 08/27/2023   Lab Results  Component Value Date   CHOL 109 09/08/2022   HDL 31 (L) 09/08/2022   LDLCALC 50 09/08/2022   TRIG 138 09/08/2022  CHOLHDL 3.5 09/08/2022    Lab Results   Component Value Date   HGBA1C 10.0 (H) 10/28/2022   Lab Results  Component Value Date   TSH 7.559 (H) 11/12/2022    Labs dated January 17, 2024 in Care Everywhere:  Hemoglobin A1c 6.5  Labs dated February 19, 2023 from Care Everywhere:  Hemoglobin 13.1, hematocrit 42.6, WBC 10.9, platelets 275 Sodium 137, potassium 4.3, chloride 105, CO2 21, BUN 13, creatinine 1.1, glucose 151 Calcium  9.6, albumin  3.7, total protein 8.5 Total bilirubin 0.3, alkaline phosphatase 105, AST 18, ALT 21 Total cholesterol 124, triglycerides 113, HDL 42, LDL 59     Assessment & Plan    1.  Precordial pain/coronary artery disease: Status post drug-eluting stent placement to the LAD in April 2023 with repeat catheterization in June 2024 showing patent LAD stent and otherwise nonobstructive disease.  Over the past few months, has been having intermittent sharp chest pain radiating to his left shoulder occurring several times per day, lasting up to a few minutes without associated symptoms, resolving spontaneously.  He has not any exertional symptoms.  Recent echo was reassuring with normal LV function and normal bioprosthetic valve function.  Though pain is atypical, patient is concerned regarding symptoms as they are somewhat reminiscent to what he experienced in 2023.  Mutually agreed to pursue a Lexiscan  PET stress to rule out ischemia.  Continue aspirin , statin, and beta-blocker therapy.  2.  Aortic stenosis/bicuspid aortic valve status post bioprosthetic aortic valve replacement: Status post AVR in June 2024.  Recent echo showing normal valve function.  3.  Chronic heart failure with improved ejection fraction: EF previously 45 to 50% in 2019 with most recent echo November 2025 showing EF of 60 to 65% with normal functioning aortic valve prosthesis.  He is euvolemic on examination has been losing weight on Mounjaro therapy.  Heart rate and blood pressure are stable.  Continue beta-blocker and Jardiance .  He  does not typically require as needed Lasix .  4.  Hyperlipidemia: LDL of 59 in October 2024 with normal LFTs at that time.  He is due for follow-up but typically has this done through primary care.  He remains on atorvastatin  therapy.  5.  Type 2 diabetes mellitus: A1c of 6.5 in September.  Significant weight loss over the past year, down 25 pounds since September 2024.  Remains on Mounjaro as well as Jardiance , metformin , and Lantus .  6.  Morbid obesity: He is down 9 pounds since his last visit and 25 pounds since September 2024.  He remains on Mounjaro.  7.  Disposition: Follow-up stress testing.  Follow-up in clinic in 1 month or sooner if necessary.  Lonni Meager, NP 04/20/2024, 9:41 AM

## 2024-04-20 NOTE — Patient Instructions (Signed)
 Medication Instructions:   Your physician recommends that you continue on your current medications as directed. Please refer to the Current Medication list given to you today.    *If you need a refill on your cardiac medications before your next appointment, please call your pharmacy*  Lab Work: No labs ordered today  If you have labs (blood work) drawn today and your tests are completely normal, you will receive your results only by: MyChart Message (if you have MyChart) OR A paper copy in the mail If you have any lab test that is abnormal or we need to change your treatment, we will call you to review the results.  Testing/Procedures:    Please report to Radiology at the [facility] Main Entrance 30 minutes early for your test.  [address] [place], [place] [address]                         OR   Please report to Radiology at [facility] Main Entrance, medical mall, 30 mins prior to your test.  [address]  [place], [place]  How to Prepare for Your Cardiac PET/CT Stress Test:  Nothing to eat or drink, except water, 3 hours prior to arrival time.  NO caffeine/decaffeinated products, or chocolate 12 hours prior to arrival. (Please note decaffeinated beverages (teas/coffees) still contain caffeine).  If you have caffeine within 12 hours prior, the test will need to be rescheduled.  Medication instructions: Do not take erectile dysfunction medications for 72 hours prior to test (sildenafil, tadalafil) Do not take nitrates (isosorbide  mononitrate, Ranexa) the day before or day of test Do not take tamsulosin the day before or morning of test Hold theophylline containing medications for 12 hours. Hold Dipyridamole 48 hours prior to the test.  Diabetic Preparation: If able to eat breakfast prior to 3 hour fasting, you may take all medications, including your insulin . Do not worry if you miss your breakfast dose of insulin  - start at your  next meal. If you do not eat prior to 3 hour fast-Hold all diabetes (oral and insulin ) medications. Patients who wear a continuous glucose monitor MUST remove the device prior to scanning.  You may take your remaining medications with water.  NO perfume, cologne or lotion on chest or abdomen area. FEMALES - Please avoid wearing dresses to this appointment.  Total time is 1 to 2 hours; you may want to bring reading material for the waiting time.  IF YOU THINK YOU MAY BE PREGNANT, OR ARE NURSING PLEASE INFORM THE TECHNOLOGIST.  In preparation for your appointment, medication and supplies will be purchased.  Appointment availability is limited, so if you need to cancel or reschedule, please call the Radiology Department Scheduler at [phone] 24 hours in advance to avoid a cancellation fee of $100.00  What to Expect When you Arrive:  Once you arrive and check in for your appointment, you will be taken to a preparation room within the Radiology Department.  A technologist or Nurse will obtain your medical history, verify that you are correctly prepped for the exam, and explain the procedure.  Afterwards, an IV will be started in your arm and electrodes will be placed on your skin for EKG monitoring during the stress portion of the exam. Then you will be escorted to the PET/CT scanner.  There, staff will get you positioned on the scanner and obtain a blood pressure and EKG.  During the exam,  you will continue to be connected to the EKG and blood pressure machines.  A small, safe amount of a radioactive tracer will be injected in your IV to obtain a series of pictures of your heart along with an injection of a stress agent.    After your Exam:  It is recommended that you eat a meal and drink a caffeinated beverage to counter act any effects of the stress agent.  Drink plenty of fluids for the remainder of the day and urinate frequently for the first couple of hours after the exam.  Your doctor will  inform you of your test results within 7-10 business days.  For more information and frequently asked questions, please visit our website: [email or web address]  For questions about your test or how to prepare for your test, please call: Cardiac Imaging Nurse Navigators Office: [phone]   Follow-Up: At [facility], you and your health needs are our priority.  As part of our continuing mission to provide you with exceptional heart care, our providers are all part of one team.  This team includes your primary Cardiologist (physician) and Advanced Practice Providers or APPs (Physician Assistants and Nurse Practitioners) who all work together to provide you with the care you need, when you need it.  Your next appointment:   1 month(s)  Provider:   Deatrice Cage, MD or Lonni Meager, NP    We recommend signing up for the patient portal called MyChart.  Sign up information is provided on this After Visit Summary.  MyChart is used to connect with patients for Virtual Visits (Telemedicine).  Patients are able to view lab/test results, encounter notes, upcoming appointments, etc.  Non-urgent messages can be sent to your provider as well.   To learn more about what you can do with MyChart, go to [email or web address].

## 2024-05-23 ENCOUNTER — Encounter (HOSPITAL_COMMUNITY): Payer: Self-pay

## 2024-05-25 ENCOUNTER — Ambulatory Visit: Payer: Self-pay | Admitting: Nurse Practitioner

## 2024-05-25 ENCOUNTER — Ambulatory Visit
Admission: RE | Admit: 2024-05-25 | Discharge: 2024-05-25 | Disposition: A | Source: Ambulatory Visit | Attending: Nurse Practitioner | Admitting: Nurse Practitioner

## 2024-05-25 DIAGNOSIS — Z952 Presence of prosthetic heart valve: Secondary | ICD-10-CM | POA: Diagnosis not present

## 2024-05-25 DIAGNOSIS — R072 Precordial pain: Secondary | ICD-10-CM | POA: Insufficient documentation

## 2024-05-25 LAB — NM PET CT CARDIAC PERFUSION MULTI W/ABSOLUTE BLOODFLOW
MBFR: 2.79
Nuc Rest EF: 62 %
Nuc Stress EF: 67 %
Peak HR: 103 {beats}/min
Rest HR: 102 {beats}/min
Rest MBF: 0.84 ml/g/min
Rest Nuclear Isotope Dose: 24.9 mCi
SRS: 0
SSS: 0
ST Depression (mm): 0 mm
Stress MBF: 2.34 ml/g/min
Stress Nuclear Isotope Dose: 25 mCi
TID: 1.09

## 2024-05-25 MED ORDER — RUBIDIUM RB82 GENERATOR (RUBYFILL)
25.0000 | PACK | Freq: Once | INTRAVENOUS | Status: AC
  Administered 2024-05-25: 24.93 via INTRAVENOUS

## 2024-05-25 MED ORDER — RUBIDIUM RB82 GENERATOR (RUBYFILL)
25.0000 | PACK | Freq: Once | INTRAVENOUS | Status: AC
  Administered 2024-05-25: 24.95 via INTRAVENOUS

## 2024-05-25 MED ORDER — REGADENOSON 0.4 MG/5ML IV SOLN
INTRAVENOUS | Status: AC
  Filled 2024-05-25: qty 5

## 2024-05-25 MED ORDER — REGADENOSON 0.4 MG/5ML IV SOLN
0.4000 mg | Freq: Once | INTRAVENOUS | Status: AC
  Administered 2024-05-25: 0.4 mg via INTRAVENOUS
  Filled 2024-05-25: qty 5

## 2024-05-26 ENCOUNTER — Encounter: Payer: Self-pay | Admitting: Emergency Medicine

## 2024-06-01 ENCOUNTER — Ambulatory Visit: Admitting: Cardiovascular Disease

## 2024-06-16 ENCOUNTER — Ambulatory Visit: Admitting: Nurse Practitioner
